# Patient Record
Sex: Male | Born: 1945
Health system: Southern US, Community
[De-identification: ages and names within clinical notes are randomized; demographics above are authoritative.]

## PROBLEM LIST (undated history)

## (undated) DIAGNOSIS — I10 Essential (primary) hypertension: Secondary | ICD-10-CM

## (undated) DIAGNOSIS — E785 Hyperlipidemia, unspecified: Secondary | ICD-10-CM

## (undated) DIAGNOSIS — N529 Male erectile dysfunction, unspecified: Secondary | ICD-10-CM

## (undated) DIAGNOSIS — B181 Chronic viral hepatitis B without delta-agent: Secondary | ICD-10-CM

## (undated) DIAGNOSIS — E109 Type 1 diabetes mellitus without complications: Secondary | ICD-10-CM

## (undated) HISTORY — DX: Hyperlipidemia, unspecified: E78.5

## (undated) HISTORY — DX: Type 1 diabetes mellitus without complications: E10.9

## (undated) HISTORY — DX: Male erectile dysfunction, unspecified: N52.9

## (undated) HISTORY — DX: Gilbert syndrome: E80.4

## (undated) HISTORY — PX: FEMORAL HERNIA REPAIR: SHX632

## (undated) HISTORY — DX: Chronic viral hepatitis B without delta-agent: B18.1

## (undated) HISTORY — DX: Essential (primary) hypertension: I10

---
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
## 2006-10-28 ENCOUNTER — Ambulatory Visit: Payer: Self-pay | Admitting: Family Medicine

## 2006-11-18 ENCOUNTER — Ambulatory Visit: Payer: Self-pay | Admitting: Family Medicine

## 2007-03-20 ENCOUNTER — Ambulatory Visit: Payer: Self-pay | Admitting: Family Medicine

## 2007-04-29 ENCOUNTER — Ambulatory Visit: Payer: Self-pay | Admitting: Gastroenterology

## 2007-05-27 LAB — HM COLONOSCOPY: HM Colonoscopy: NORMAL

## 2007-08-04 ENCOUNTER — Ambulatory Visit: Payer: Self-pay | Admitting: Family Medicine

## 2007-09-14 ENCOUNTER — Ambulatory Visit (HOSPITAL_COMMUNITY): Payer: Self-pay | Admitting: Psychiatry

## 2007-10-05 ENCOUNTER — Ambulatory Visit (HOSPITAL_COMMUNITY): Payer: Self-pay | Admitting: Psychiatry

## 2007-10-26 ENCOUNTER — Ambulatory Visit (HOSPITAL_COMMUNITY): Payer: Self-pay | Admitting: Marriage and Family Therapist

## 2007-10-28 ENCOUNTER — Ambulatory Visit (HOSPITAL_COMMUNITY): Payer: Self-pay | Admitting: Psychiatry

## 2007-11-10 ENCOUNTER — Ambulatory Visit: Payer: Self-pay | Admitting: Family Medicine

## 2007-12-03 ENCOUNTER — Ambulatory Visit: Payer: Self-pay | Admitting: Family Medicine

## 2007-12-09 ENCOUNTER — Ambulatory Visit (HOSPITAL_COMMUNITY): Payer: Self-pay | Admitting: Psychiatry

## 2008-01-12 DIAGNOSIS — E119 Type 2 diabetes mellitus without complications: Secondary | ICD-10-CM

## 2008-01-12 DIAGNOSIS — I10 Essential (primary) hypertension: Secondary | ICD-10-CM | POA: Insufficient documentation

## 2008-01-12 DIAGNOSIS — E785 Hyperlipidemia, unspecified: Secondary | ICD-10-CM

## 2008-02-10 ENCOUNTER — Ambulatory Visit (HOSPITAL_COMMUNITY): Payer: Self-pay | Admitting: Psychiatry

## 2008-04-04 ENCOUNTER — Ambulatory Visit: Payer: Self-pay | Admitting: Family Medicine

## 2008-04-13 ENCOUNTER — Ambulatory Visit (HOSPITAL_COMMUNITY): Payer: Self-pay | Admitting: Licensed Clinical Social Worker

## 2008-06-13 ENCOUNTER — Ambulatory Visit (HOSPITAL_COMMUNITY): Payer: Self-pay | Admitting: Psychiatry

## 2008-08-08 ENCOUNTER — Ambulatory Visit (HOSPITAL_COMMUNITY): Payer: Self-pay | Admitting: Psychiatry

## 2008-08-30 ENCOUNTER — Ambulatory Visit: Payer: Self-pay | Admitting: Family Medicine

## 2008-10-05 ENCOUNTER — Ambulatory Visit (HOSPITAL_COMMUNITY): Payer: Self-pay | Admitting: Psychiatry

## 2009-01-04 ENCOUNTER — Ambulatory Visit (HOSPITAL_COMMUNITY): Payer: Self-pay | Admitting: Psychiatry

## 2009-01-05 ENCOUNTER — Ambulatory Visit: Payer: Self-pay | Admitting: Family Medicine

## 2009-03-10 ENCOUNTER — Ambulatory Visit (HOSPITAL_COMMUNITY): Payer: Self-pay | Admitting: Psychiatry

## 2009-04-19 ENCOUNTER — Ambulatory Visit: Payer: Self-pay | Admitting: Family Medicine

## 2009-06-14 ENCOUNTER — Ambulatory Visit (HOSPITAL_COMMUNITY): Payer: Self-pay | Admitting: Psychiatry

## 2009-08-02 ENCOUNTER — Ambulatory Visit: Payer: Self-pay | Admitting: Family Medicine

## 2009-09-18 ENCOUNTER — Ambulatory Visit (HOSPITAL_COMMUNITY): Payer: Self-pay | Admitting: Psychiatry

## 2009-11-30 ENCOUNTER — Ambulatory Visit: Payer: Self-pay | Admitting: Family Medicine

## 2010-01-10 ENCOUNTER — Ambulatory Visit (HOSPITAL_COMMUNITY): Payer: Self-pay | Admitting: Psychiatry

## 2010-04-03 ENCOUNTER — Ambulatory Visit: Payer: Self-pay | Admitting: Family Medicine

## 2010-05-03 ENCOUNTER — Ambulatory Visit: Payer: Self-pay | Admitting: Family Medicine

## 2010-08-09 ENCOUNTER — Ambulatory Visit: Payer: Self-pay | Admitting: Family Medicine

## 2010-09-04 ENCOUNTER — Ambulatory Visit: Payer: Self-pay | Admitting: Family Medicine

## 2010-09-14 ENCOUNTER — Ambulatory Visit: Payer: Self-pay | Admitting: Family Medicine

## 2010-09-19 ENCOUNTER — Ambulatory Visit: Payer: Self-pay | Admitting: Family Medicine

## 2010-10-15 ENCOUNTER — Ambulatory Visit
Admission: RE | Admit: 2010-10-15 | Discharge: 2010-10-15 | Payer: Self-pay | Source: Home / Self Care | Attending: Family Medicine | Admitting: Family Medicine

## 2011-01-02 ENCOUNTER — Ambulatory Visit (INDEPENDENT_AMBULATORY_CARE_PROVIDER_SITE_OTHER): Payer: BC Managed Care – PPO | Admitting: Family Medicine

## 2011-01-02 DIAGNOSIS — E119 Type 2 diabetes mellitus without complications: Secondary | ICD-10-CM

## 2011-01-02 DIAGNOSIS — Z79899 Other long term (current) drug therapy: Secondary | ICD-10-CM

## 2011-01-02 DIAGNOSIS — F39 Unspecified mood [affective] disorder: Secondary | ICD-10-CM

## 2011-01-02 DIAGNOSIS — E785 Hyperlipidemia, unspecified: Secondary | ICD-10-CM

## 2011-01-02 DIAGNOSIS — I10 Essential (primary) hypertension: Secondary | ICD-10-CM

## 2011-02-12 NOTE — Assessment & Plan Note (Signed)
[facility]                         GASTROENTEROLOGY OFFICE NOTE   SHIMON, TROWBRIDGE                       MRN:          [id number]  DATE:04/29/2007                            DOB:          20-Jan-1946    REFERRING PHYSICIAN:  Sharlot Gowda, M.D.   REASON FOR REFERRAL:  Dr. Susann Givens asked me to evaluate Christopher Welch in  consultation regarding colorectal cancer screening; the patient is at  high risk for endoscopic complications due to his insulin-requiring  diabetes.   HISTORY OF PRESENT ILLNESS:  Christopher Welch is a very pleasant 65 year old  man who has been on insulin for 16 years for severe diabetes.  He has  never had troubles with his colon, no constipation, no diarrhea, no  overt GI bleeding.  He has not had colorectal cancer screening to date.   REVIEW OF SYSTEMS:  Notable for stable weight, otherwise essentially  normal and is available on our intake sheet.   PAST MEDICAL HISTORY:  1. Type 1 diabetes.  2. Hypertension.  3. Elevated cholesterol.  4. Hernia surgery 4 years ago.   CURRENT MEDICINES:  1. Lantus in the morning.  2. Humalog throughout the day.  3. Univasc.  4. Lipitor.  5. Niaspan.  6. Aspirin.   ALLERGIES:  ERYTHROMYCIN.   SOCIAL HISTORY:  Single, lives alone, works as a Hydrologist for  [employer], nonsmoker, nondrinker.   FAMILY HISTORY:  Alcoholism and diabetes run in his family.  Cancer and  colon polyps do not run in his family.   PHYSICAL EXAMINATION:  5 feet 9 inches, 149 pounds, blood pressure  138/74, pulse 68.  CONSTITUTIONAL:  Generally well-appearing.  NEUROLOGIC:  Alert and oriented x3.  EYES:  Extraocular movements intact.  MOUTH:  Oropharynx is moist, no lesions.  NECK:  Supple.  No lymphadenopathy.  CARDIOVASCULAR:  Heart regular rate and rhythm.  LUNGS:  Clear to auscultation bilaterally.  ABDOMEN:  Soft, nontender, nondistended, normal bowel sounds.  EXTREMITIES:  No lower extremity  edema.  SKIN:  No rashes or lesions are visible on extremities.   ASSESSMENT/PLAN:  A 65 year old man at routine risk for colorectal  cancer.   We will arrange for him to have a colonoscopy done at his soonest  convenience.  He will take no insulin the day of the procedure.  It  looks like we will be scheduling him for a case around lunchtime.  He  knows that it much safer for [place] to air with his sugars being a bit high  for that day rather than low.   I see no reason for any further blood tests or imaging studies prior to  that.     Rachael Fee, MD  Electronically Signed    DPJ/MedQ  DD: 04/29/2007  DT: 04/30/2007  Job #: [id number]   cc:   Sharlot Gowda, M.D.

## 2011-02-18 ENCOUNTER — Other Ambulatory Visit: Payer: Self-pay | Admitting: Family Medicine

## 2011-02-18 MED ORDER — ATORVASTATIN CALCIUM 20 MG PO TABS: 20.0000 mg | ORAL_TABLET | Freq: Every day | ORAL | Status: DC

## 2011-02-26 ENCOUNTER — Other Ambulatory Visit: Payer: Self-pay | Admitting: Family Medicine

## 2011-02-26 MED ORDER — CITALOPRAM HYDROBROMIDE 20 MG PO TABS: 20.0000 mg | ORAL_TABLET | Freq: Every day | ORAL | Status: DC

## 2011-03-06 ENCOUNTER — Other Ambulatory Visit: Payer: Self-pay | Admitting: Family Medicine

## 2011-05-06 ENCOUNTER — Encounter: Payer: Self-pay | Admitting: Family Medicine

## 2011-05-07 ENCOUNTER — Encounter: Payer: Self-pay | Admitting: Family Medicine

## 2011-05-07 ENCOUNTER — Ambulatory Visit (INDEPENDENT_AMBULATORY_CARE_PROVIDER_SITE_OTHER): Payer: BC Managed Care – PPO | Admitting: Family Medicine

## 2011-05-07 DIAGNOSIS — E1159 Type 2 diabetes mellitus with other circulatory complications: Secondary | ICD-10-CM

## 2011-05-07 DIAGNOSIS — E119 Type 2 diabetes mellitus without complications: Secondary | ICD-10-CM

## 2011-05-07 DIAGNOSIS — I10 Essential (primary) hypertension: Secondary | ICD-10-CM

## 2011-05-07 DIAGNOSIS — E1169 Type 2 diabetes mellitus with other specified complication: Secondary | ICD-10-CM

## 2011-05-07 DIAGNOSIS — E785 Hyperlipidemia, unspecified: Secondary | ICD-10-CM

## 2011-05-07 NOTE — Progress Notes (Signed)
  Subjective:    Patient ID: Christopher Welch, male    DOB: 1946-06-19, 65 y.o.   MRN: [id number]  HPI He is here for recheck. He is now retired. His stress levels have diminished practically nothing. He rarely takes Klonopin or sleep med. He is in the process of readjusting his lifestyle in regard to diet and exercise. Continues to check his feet regularly. He does not smoke. He drinks maybe one Son of wine per night. Checks his blood sugars regularly. He is interested in stopping his Celexa. Since he is now retired he feels much more calm and relaxed. He states he is very stop his Lamictal  Review of Systems     Objective:   Physical Exam Alert and in no distress otherwise not examined with appropriate affect. Hemoglobin A1c 7.4       Assessment & Plan:  Diabetes. Hypertension. Hyperlipidemia. Dysthymia. Discussed diet and exercise with him especially in regard to his Stop. He will stop the Celexa. He has already stopped his Lamictal Recheck here in 4 months.

## 2011-07-01 ENCOUNTER — Other Ambulatory Visit: Payer: Self-pay

## 2011-07-01 MED ORDER — GLUCOSE BLOOD VI STRP: ORAL_STRIP | Status: DC

## 2011-07-01 NOTE — Telephone Encounter (Signed)
Ordered test strips for accu check nano

## 2011-08-12 ENCOUNTER — Telehealth: Payer: Self-pay | Admitting: Family Medicine

## 2011-08-12 MED ORDER — LISINOPRIL 10 MG PO TABS: 10.0000 mg | ORAL_TABLET | Freq: Every day | ORAL | Status: DC

## 2011-08-12 NOTE — Telephone Encounter (Signed)
Lisinopril renew

## 2011-08-22 ENCOUNTER — Other Ambulatory Visit: Payer: Self-pay | Admitting: Family Medicine

## 2011-08-23 NOTE — Telephone Encounter (Signed)
Is this okay?

## 2011-09-06 ENCOUNTER — Encounter: Payer: Self-pay | Admitting: Family Medicine

## 2011-09-06 ENCOUNTER — Ambulatory Visit (INDEPENDENT_AMBULATORY_CARE_PROVIDER_SITE_OTHER): Payer: BC Managed Care – PPO | Admitting: Family Medicine

## 2011-09-06 DIAGNOSIS — E1139 Type 2 diabetes mellitus with other diabetic ophthalmic complication: Secondary | ICD-10-CM

## 2011-09-06 DIAGNOSIS — E1339 Other specified diabetes mellitus with other diabetic ophthalmic complication: Secondary | ICD-10-CM

## 2011-09-06 DIAGNOSIS — I10 Essential (primary) hypertension: Secondary | ICD-10-CM

## 2011-09-06 DIAGNOSIS — E7800 Pure hypercholesterolemia, unspecified: Secondary | ICD-10-CM

## 2011-09-06 DIAGNOSIS — E11319 Type 2 diabetes mellitus with unspecified diabetic retinopathy without macular edema: Secondary | ICD-10-CM

## 2011-09-06 DIAGNOSIS — E119 Type 2 diabetes mellitus without complications: Secondary | ICD-10-CM

## 2011-09-06 DIAGNOSIS — E109 Type 1 diabetes mellitus without complications: Secondary | ICD-10-CM

## 2011-09-06 DIAGNOSIS — E13319 Other specified diabetes mellitus with unspecified diabetic retinopathy without macular edema: Secondary | ICD-10-CM

## 2011-09-06 LAB — POCT GLYCOSYLATED HEMOGLOBIN (HGB A1C): Hemoglobin A1C: 7.4

## 2011-09-06 MED ORDER — INSULIN GLARGINE 100 UNIT/ML ~~LOC~~ SOLN: 40.0000 [IU] | Freq: Every day | SUBCUTANEOUS | Status: DC

## 2011-09-06 MED ORDER — ATORVASTATIN CALCIUM 20 MG PO TABS: 20.0000 mg | ORAL_TABLET | Freq: Every day | ORAL | Status: DC

## 2011-09-06 NOTE — Progress Notes (Signed)
  Subjective:    Patient ID: Christopher Welch, male    DOB: Jun 01, 1946, 65 y.o.   MRN: [id number]  HPI Her for a diabetes recheck. He is now living in [place] and retired from Agricultural consultant but keeping busy with other activities. He is also noted a slight weight gain and is now involved in an exercise program. He continues to check his blood sugars regularly. He does not smoke. He is now living with his partner. His medications were reviewed. He has an eye exam set up for January. He does check his feet periodically.   Review of Systems     Objective:   Physical Exam Alert and in no distress. Hemoglobin A1c is 7.4.       Assessment & Plan:   1. Diabetes mellitus  POCT HgB A1C  2. Retinopathy due to secondary diabetes    3. HYPERTENSION    4. HYPERCHOLESTEROLEMIA    5. DIABETES MELLITUS, TYPE I     I encouraged him to continue his making diet and exercise changes to get his A1c back below 7. Check here 4 months.

## 2011-09-10 ENCOUNTER — Other Ambulatory Visit: Payer: Self-pay

## 2011-09-10 NOTE — Telephone Encounter (Signed)
Pt use vial for his lantus

## 2011-11-18 ENCOUNTER — Telehealth: Payer: Self-pay | Admitting: Family Medicine

## 2011-11-18 MED ORDER — LISINOPRIL 10 MG PO TABS: 10.0000 mg | ORAL_TABLET | Freq: Every day | ORAL | Status: DC

## 2011-11-18 NOTE — Telephone Encounter (Signed)
Lisinopril renewed to local pharmacy

## 2011-11-22 ENCOUNTER — Telehealth: Payer: Self-pay | Admitting: Internal Medicine

## 2011-11-22 NOTE — Telephone Encounter (Signed)
Pt stated Dr Susann Givens is not in  His ins  Network. Pt is requesting dr Lovell Sheehan as new pcp.

## 2011-11-22 NOTE — Telephone Encounter (Signed)
Sorry.dr jenkins is not taking new pts. Several other mds in practice who is taking new pt

## 2011-11-26 NOTE — Telephone Encounter (Signed)
Pt will still with Dr Susann Givens

## 2011-12-03 ENCOUNTER — Encounter: Payer: BC Managed Care – PPO | Admitting: Family Medicine

## 2011-12-05 ENCOUNTER — Encounter: Payer: Self-pay | Admitting: Family Medicine

## 2011-12-05 ENCOUNTER — Ambulatory Visit (INDEPENDENT_AMBULATORY_CARE_PROVIDER_SITE_OTHER): Payer: 59 | Admitting: Family Medicine

## 2011-12-05 DIAGNOSIS — E109 Type 1 diabetes mellitus without complications: Secondary | ICD-10-CM

## 2011-12-05 DIAGNOSIS — E11329 Type 2 diabetes mellitus with mild nonproliferative diabetic retinopathy without macular edema: Secondary | ICD-10-CM

## 2011-12-05 DIAGNOSIS — I10 Essential (primary) hypertension: Secondary | ICD-10-CM

## 2011-12-05 DIAGNOSIS — Z0000 Encounter for general adult medical examination without abnormal findings: Secondary | ICD-10-CM

## 2011-12-05 DIAGNOSIS — E1139 Type 2 diabetes mellitus with other diabetic ophthalmic complication: Secondary | ICD-10-CM

## 2011-12-05 DIAGNOSIS — E113299 Type 2 diabetes mellitus with mild nonproliferative diabetic retinopathy without macular edema, unspecified eye: Secondary | ICD-10-CM | POA: Insufficient documentation

## 2011-12-05 DIAGNOSIS — H579 Unspecified disorder of eye and adnexa: Secondary | ICD-10-CM

## 2011-12-05 DIAGNOSIS — E7800 Pure hypercholesterolemia, unspecified: Secondary | ICD-10-CM

## 2011-12-05 DIAGNOSIS — Z79899 Other long term (current) drug therapy: Secondary | ICD-10-CM

## 2011-12-05 LAB — CBC WITH DIFFERENTIAL/PLATELET
Basophils Relative: 1 % (ref 0–1)
Eosinophils Relative: 5 % (ref 0–5)
HCT: 44.8 % (ref 39.0–52.0)
Hemoglobin: 15.3 g/dL (ref 13.0–17.0)
MCH: 32.6 pg (ref 26.0–34.0)
MCHC: 34.2 g/dL (ref 30.0–36.0)
MCV: 95.5 fL (ref 78.0–100.0)
Monocytes Absolute: 0.8 10*3/uL (ref 0.1–1.0)
Monocytes Relative: 11 % (ref 3–12)
Neutro Abs: 2.8 10*3/uL (ref 1.7–7.7)

## 2011-12-05 LAB — POCT UA - MICROALBUMIN: Albumin/Creatinine Ratio, Urine, POC: 5.5

## 2011-12-05 LAB — COMPREHENSIVE METABOLIC PANEL
ALT: 19 U/L (ref 0–53)
AST: 25 U/L (ref 0–37)
CO2: 26 mEq/L (ref 19–32)
Calcium: 10.1 mg/dL (ref 8.4–10.5)
Chloride: 101 mEq/L (ref 96–112)
Sodium: 138 mEq/L (ref 135–145)
Total Bilirubin: 2 mg/dL — ABNORMAL HIGH (ref 0.3–1.2)
Total Protein: 7.2 g/dL (ref 6.0–8.3)

## 2011-12-05 LAB — HEMOCCULT GUIAC POC 1CARD (OFFICE)

## 2011-12-05 LAB — LIPID PANEL
Cholesterol: 108 mg/dL (ref 0–200)
Total CHOL/HDL Ratio: 2.1 Ratio
VLDL: 11 mg/dL (ref 0–40)

## 2011-12-05 MED ORDER — INSULIN GLARGINE 100 UNIT/ML ~~LOC~~ SOLN: 40.0000 [IU] | Freq: Every day | SUBCUTANEOUS | Status: DC

## 2011-12-05 MED ORDER — LISINOPRIL 10 MG PO TABS: 10.0000 mg | ORAL_TABLET | Freq: Every day | ORAL | Status: DC

## 2011-12-05 MED ORDER — ATORVASTATIN CALCIUM 20 MG PO TABS: 20.0000 mg | ORAL_TABLET | Freq: Every day | ORAL | Status: DC

## 2011-12-05 MED ORDER — INSULIN LISPRO 100 UNIT/ML ~~LOC~~ SOLN: 25.0000 [IU] | Freq: Every day | SUBCUTANEOUS | Status: DC

## 2011-12-05 MED ORDER — NIACIN ER (ANTIHYPERLIPIDEMIC) 750 MG PO TBCR: 750.0000 mg | EXTENDED_RELEASE_TABLET | Freq: Every day | ORAL | Status: DC

## 2011-12-05 NOTE — Patient Instructions (Signed)
Keep up the good work

## 2011-12-05 NOTE — Progress Notes (Signed)
  Subjective:    Patient ID: Christopher Welch, male    DOB: Jun 04, 1946, 66 y.o.   MRN: [id number]  HPI He is here for complete examination. He is now retired and living in [place]. He is in a long-term relationship which is going quite well. He has done a very good job taking care of himself and his new life situation. He keeps itself is the with various activities. His blood sugars are checked regularly. He checks his feet regularly. Had an eye exam in January. He has no other concerns or complaints.   Review of Systems  Constitutional: Negative.   HENT: Negative.   Eyes: Negative.   Respiratory: Negative.   Cardiovascular: Negative.   Gastrointestinal: Negative.   Genitourinary: Negative.   Musculoskeletal: Negative.   Skin: Negative.   Neurological: Negative.   Hematological: Negative.        Objective:   Physical Exam BP 122/80  Pulse 80  Ht 5' 9.5" (1.765 m)  Wt 164 lb (74.39 kg)  BMI 23.87 kg/m2  General Appearance:    Alert, cooperative, no distress, appears stated age  Head:    Normocephalic, without obvious abnormality, atraumatic  Eyes:    PERRL, conjunctiva/corneas clear, EOM's intact, fundi    benign  Ears:    Normal TM's and external ear canals  Nose:   Nares normal, mucosa normal, no drainage or sinus   tenderness  Throat:   Lips, mucosa, and tongue normal; teeth and gums normal  Neck:   Supple, no lymphadenopathy;  thyroid:  no   enlargement/tenderness/nodules; no carotid   bruit or JVD  Back:    Spine nontender, no curvature, ROM normal, no CVA     tenderness  Lungs:     Clear to auscultation bilaterally without wheezes, rales or     ronchi; respirations unlabored  Chest Wall:    No tenderness or deformity   Heart:    Regular rate and rhythm, S1 and S2 normal, no murmur, rub   or gallop  Breast Exam:    No chest wall tenderness, masses or gynecomastia  Abdomen:     Soft, non-tender, nondistended, normoactive bowel sounds,    no masses, no  hepatosplenomegaly  Genitalia:    Normal male external genitalia without lesions.  Testicles without masses.  No inguinal hernias.  Rectal:    Normal sphincter tone, no masses or tenderness; guaiac negative stool.  Prostate smooth, no nodules, not enlarged.  Extremities:   No clubbing, cyanosis or edema  Pulses:   2+ and symmetric all extremities  Skin:   Skin color, texture, turgor normal, no rashes or lesions  Lymph nodes:   Cervical, supraclavicular, and axillary nodes normal  Neurologic:   CNII-XII intact, normal strength, sensation and gait; reflexes 2+ and symmetric throughout          Psych:   Normal mood, affect, hygiene and grooming.           Assessment & Plan:   1. Routine general medical examination at a health care facility  Hemoccult - 1 Card (office)  2. DIABETES MELLITUS, TYPE I  CBC with Differential, Comprehensive metabolic panel, Lipid panel, POCT UA - Microalbumin  3. HYPERTENSION  CBC with Differential, Comprehensive metabolic panel  4. HYPERCHOLESTEROLEMIA  Lipid panel  5. Diabetic retinopathy, nonproliferative, mild    6. Encounter for long-term (current) use of other medications  CBC with Differential, Comprehensive metabolic panel, Lipid panel

## 2011-12-06 ENCOUNTER — Telehealth: Payer: Self-pay | Admitting: Internal Medicine

## 2011-12-06 MED ORDER — INSULIN LISPRO 100 UNIT/ML ~~LOC~~ SOLN: 25.0000 [IU] | Freq: Every day | SUBCUTANEOUS | Status: DC

## 2011-12-06 MED ORDER — NIACIN ER (ANTIHYPERLIPIDEMIC) 750 MG PO TBCR: EXTENDED_RELEASE_TABLET | ORAL | Status: DC

## 2011-12-06 MED ORDER — INSULIN GLARGINE 100 UNIT/ML ~~LOC~~ SOLN: 40.0000 [IU] | Freq: Every day | SUBCUTANEOUS | Status: DC

## 2011-12-06 MED ORDER — ATORVASTATIN CALCIUM 20 MG PO TABS: 20.0000 mg | ORAL_TABLET | Freq: Every day | ORAL | Status: DC

## 2011-12-06 MED ORDER — LISINOPRIL 10 MG PO TABS: 10.0000 mg | ORAL_TABLET | Freq: Every day | ORAL | Status: DC

## 2011-12-06 NOTE — Telephone Encounter (Signed)
Pt requested a 90 day supply for 5 meds which had been send to [facility] and they told him for a 90 day supply to go to [facility]. So since Dr. Susann Givens had filled them for him on 3*7, I resent them to [facility] with a 90 day supply per pt request

## 2011-12-06 NOTE — Progress Notes (Signed)
Quick Note:  The blood work is normal ______ 

## 2012-01-07 ENCOUNTER — Ambulatory Visit: Payer: BC Managed Care – PPO | Admitting: Family Medicine

## 2012-04-09 ENCOUNTER — Ambulatory Visit: Payer: 59 | Admitting: Family Medicine

## 2012-04-13 ENCOUNTER — Encounter: Payer: Self-pay | Admitting: Family Medicine

## 2012-04-13 ENCOUNTER — Ambulatory Visit (INDEPENDENT_AMBULATORY_CARE_PROVIDER_SITE_OTHER): Payer: 59 | Admitting: Family Medicine

## 2012-04-13 VITALS — BP 126/80 | HR 90 | Wt 161.0 lb

## 2012-04-13 DIAGNOSIS — E7800 Pure hypercholesterolemia, unspecified: Secondary | ICD-10-CM

## 2012-04-13 DIAGNOSIS — E109 Type 1 diabetes mellitus without complications: Secondary | ICD-10-CM

## 2012-04-13 DIAGNOSIS — I10 Essential (primary) hypertension: Secondary | ICD-10-CM

## 2012-04-13 DIAGNOSIS — E11329 Type 2 diabetes mellitus with mild nonproliferative diabetic retinopathy without macular edema: Secondary | ICD-10-CM

## 2012-04-13 DIAGNOSIS — H579 Unspecified disorder of eye and adnexa: Secondary | ICD-10-CM

## 2012-04-13 DIAGNOSIS — E113299 Type 2 diabetes mellitus with mild nonproliferative diabetic retinopathy without macular edema, unspecified eye: Secondary | ICD-10-CM

## 2012-04-13 DIAGNOSIS — K130 Diseases of lips: Secondary | ICD-10-CM

## 2012-04-13 DIAGNOSIS — E1139 Type 2 diabetes mellitus with other diabetic ophthalmic complication: Secondary | ICD-10-CM

## 2012-04-13 LAB — POCT GLYCOSYLATED HEMOGLOBIN (HGB A1C): Hemoglobin A1C: 7.1

## 2012-04-13 NOTE — Progress Notes (Signed)
  Subjective:    Patient ID: Christopher Welch, male    DOB: June 14, 1946, 66 y.o.   MRN: [id number]  HPI He is here for a recheck. He continues to take good care of himself in regard to his diabetes. He does check his blood sugars regularly as well as have yearly eye exams and does check his feet. He continues on medications listed in the chart. He does not smoke and rarely drinks. He is sexually active and is having no difficulties. He does have a lesion present on his lipid he would likely to evaluate.   Review of Systems     Objective:   Physical Exam Alert and in no distress. Purplish 0.5 cm lesion noted on the mid lower lip. Hemoglobin A1c is 6.8.       Assessment & Plan:   1. DIABETES MELLITUS, TYPE I  POCT glycosylated hemoglobin (Hb A1C)  2. HYPERCHOLESTEROLEMIA    3. HYPERTENSION    4. Diabetic retinopathy, nonproliferative, mild    5. Lip lesion  Ambulatory referral to Dermatology   He is enjoying his retirement. Encouraged him to continue with his active lifestyle. Recheck here 4 months.

## 2012-06-08 ENCOUNTER — Other Ambulatory Visit: Payer: Self-pay

## 2012-06-08 MED ORDER — NIACIN ER (ANTIHYPERLIPIDEMIC) 750 MG PO TBCR: EXTENDED_RELEASE_TABLET | ORAL | Status: DC

## 2012-06-08 MED ORDER — INSULIN LISPRO 100 UNIT/ML ~~LOC~~ SOLN: 25.0000 [IU] | Freq: Every day | SUBCUTANEOUS | Status: DC

## 2012-06-08 NOTE — Telephone Encounter (Signed)
SENT IN HUMALOG AND NIASPAN

## 2012-06-16 ENCOUNTER — Encounter: Payer: Self-pay | Admitting: Family Medicine

## 2012-08-17 ENCOUNTER — Ambulatory Visit: Payer: 59 | Admitting: Family Medicine

## 2012-08-18 ENCOUNTER — Encounter: Payer: Self-pay | Admitting: Family Medicine

## 2012-08-18 ENCOUNTER — Ambulatory Visit (INDEPENDENT_AMBULATORY_CARE_PROVIDER_SITE_OTHER): Payer: 59 | Admitting: Family Medicine

## 2012-08-18 VITALS — BP 126/80 | HR 91 | Wt 164.0 lb

## 2012-08-18 DIAGNOSIS — E109 Type 1 diabetes mellitus without complications: Secondary | ICD-10-CM

## 2012-08-19 NOTE — Progress Notes (Signed)
  Subjective:    Patient ID: Christopher Welch, male    DOB: March 19, 1946, 66 y.o.   MRN: [id number]  HPI    Review of Systems     Objective:   Physical Exam        Assessment & Plan:  Patient was not seen. He was rescheduled

## 2012-08-20 ENCOUNTER — Encounter: Payer: Self-pay | Admitting: Family Medicine

## 2012-08-20 ENCOUNTER — Ambulatory Visit (INDEPENDENT_AMBULATORY_CARE_PROVIDER_SITE_OTHER): Payer: 59 | Admitting: Family Medicine

## 2012-08-20 VITALS — BP 126/80 | HR 91 | Wt 164.0 lb

## 2012-08-20 DIAGNOSIS — I10 Essential (primary) hypertension: Secondary | ICD-10-CM

## 2012-08-20 DIAGNOSIS — E109 Type 1 diabetes mellitus without complications: Secondary | ICD-10-CM

## 2012-08-20 DIAGNOSIS — E113299 Type 2 diabetes mellitus with mild nonproliferative diabetic retinopathy without macular edema, unspecified eye: Secondary | ICD-10-CM

## 2012-08-20 DIAGNOSIS — Z2911 Encounter for prophylactic immunotherapy for respiratory syncytial virus (RSV): Secondary | ICD-10-CM

## 2012-08-20 DIAGNOSIS — E11329 Type 2 diabetes mellitus with mild nonproliferative diabetic retinopathy without macular edema: Secondary | ICD-10-CM

## 2012-08-20 DIAGNOSIS — E7800 Pure hypercholesterolemia, unspecified: Secondary | ICD-10-CM

## 2012-08-20 DIAGNOSIS — E1139 Type 2 diabetes mellitus with other diabetic ophthalmic complication: Secondary | ICD-10-CM

## 2012-08-20 NOTE — Progress Notes (Signed)
  Subjective:    Patient ID: Christopher Welch, male    DOB: [birth year], 66 y.o.   MRN: [id number]  HPI He is here for recheck. He is now retired but keeps himself quite busy. He states that this interferes with his regular exercise routine. His eating habits remain the same. Smoking and drinking and not changed. He does check his feet regularly as well as get regular eye exams, his last one being in August.. He continues on medications listed in the chart. He also has a rash present on his left shin that tends to come and go.   Review of Systems     Objective:   Physical Exam Alert and in no distress. Hemoglobin A1c is 7.5 Exam of left hand does show a dry slightly erythematous 3 x 5 cm area.      Assessment & Plan:   1. DIABETES MELLITUS, TYPE I   2. HYPERCHOLESTEROLEMIA   3. HYPERTENSION   4. Diabetic retinopathy, nonproliferative, mild    recommend conservative care for the nonspecific rash. Encouraged him to increase his physical activities.

## 2012-10-21 ENCOUNTER — Telehealth: Payer: Self-pay | Admitting: Internal Medicine

## 2012-10-21 NOTE — Telephone Encounter (Signed)
Refill request for accu-chek nano kit to [facility]

## 2012-10-23 ENCOUNTER — Other Ambulatory Visit: Payer: Self-pay

## 2012-10-23 MED ORDER — ACCU-CHEK NANO SMARTVIEW W/DEVICE KIT: 1.0000 | PACK | Freq: Two times a day (BID) | Status: AC

## 2012-10-23 NOTE — Telephone Encounter (Signed)
Sent in for pt new meter

## 2012-10-26 ENCOUNTER — Telehealth: Payer: Self-pay | Admitting: Internal Medicine

## 2012-10-26 MED ORDER — GLUCOSE BLOOD VI STRP: ORAL_STRIP | Status: AC

## 2012-10-26 NOTE — Telephone Encounter (Signed)
Pt came by today and needed test strips since in for is testing meter. i had called the patient last week to find out how many time a day he test but pt came by instead. I have sent in test strips to pharmacy

## 2012-11-02 ENCOUNTER — Telehealth: Payer: Self-pay | Admitting: Family Medicine

## 2012-11-02 MED ORDER — INSULIN GLARGINE 100 UNIT/ML ~~LOC~~ SOLN: 45.0000 [IU] | Freq: Every day | SUBCUTANEOUS | Status: DC

## 2012-11-02 MED ORDER — INSULIN LISPRO 100 UNIT/ML ~~LOC~~ SOLN: 15.0000 [IU] | Freq: Three times a day (TID) | SUBCUTANEOUS | Status: DC

## 2012-11-02 NOTE — Telephone Encounter (Signed)
Refilled med for pt.

## 2012-12-18 ENCOUNTER — Ambulatory Visit: Payer: 59 | Admitting: Family Medicine

## 2012-12-28 ENCOUNTER — Telehealth: Payer: Self-pay | Admitting: Family Medicine

## 2012-12-28 MED ORDER — NIACIN ER (ANTIHYPERLIPIDEMIC) 750 MG PO TBCR: EXTENDED_RELEASE_TABLET | ORAL | Status: DC

## 2012-12-28 NOTE — Telephone Encounter (Signed)
PT DROPPED OFF RX FORM. PLEASE REFILL NIACIN ER 750 MG. SEND TO [facility]. SENDING FORM BACK, WILL BE IN YOUR FOLDER.

## 2012-12-29 ENCOUNTER — Encounter: Payer: Self-pay | Admitting: Internal Medicine

## 2012-12-31 ENCOUNTER — Encounter: Payer: Self-pay | Admitting: Family Medicine

## 2012-12-31 ENCOUNTER — Ambulatory Visit (INDEPENDENT_AMBULATORY_CARE_PROVIDER_SITE_OTHER): Payer: Medicare Other | Admitting: Family Medicine

## 2012-12-31 VITALS — BP 118/80 | HR 80 | Wt 162.0 lb

## 2012-12-31 DIAGNOSIS — E1139 Type 2 diabetes mellitus with other diabetic ophthalmic complication: Secondary | ICD-10-CM

## 2012-12-31 DIAGNOSIS — I10 Essential (primary) hypertension: Secondary | ICD-10-CM

## 2012-12-31 DIAGNOSIS — E113299 Type 2 diabetes mellitus with mild nonproliferative diabetic retinopathy without macular edema, unspecified eye: Secondary | ICD-10-CM

## 2012-12-31 DIAGNOSIS — E109 Type 1 diabetes mellitus without complications: Secondary | ICD-10-CM

## 2012-12-31 DIAGNOSIS — E11329 Type 2 diabetes mellitus with mild nonproliferative diabetic retinopathy without macular edema: Secondary | ICD-10-CM

## 2012-12-31 DIAGNOSIS — E7800 Pure hypercholesterolemia, unspecified: Secondary | ICD-10-CM

## 2012-12-31 LAB — POCT UA - MICROALBUMIN
Albumin/Creatinine Ratio, Urine, POC: 7.2
Creatinine, POC: 100.1 mg/dL
Microalbumin Ur, POC: 7.2 mg/dL

## 2012-12-31 MED ORDER — LISINOPRIL 10 MG PO TABS: 10.0000 mg | ORAL_TABLET | Freq: Every day | ORAL | Status: DC

## 2012-12-31 MED ORDER — ATORVASTATIN CALCIUM 20 MG PO TABS: 20.0000 mg | ORAL_TABLET | Freq: Every day | ORAL | Status: DC

## 2012-12-31 NOTE — Progress Notes (Signed)
LAST EYE EXAM: 04/30/12 [facility] LAST FOOT EXAM : 08/20/12 [facility] B/S READING: 35 TO 350  TEST 3 TIMES A DAY

## 2012-12-31 NOTE — Progress Notes (Signed)
  Subjective:    Christopher Welch is a 67 y.o. male who presents for follow-up of Type 2 diabetes mellitus.    Home blood sugar records: fasting range: 35   To 350 however this occurred only one time.  Current symptoms/problems include none and have   been unchanged. Daily foot checks, foot concerns:  Last eye exam:     Medication compliance: Current diet: in general, a "healthy" diet   Current exercise: walking Known diabetic complications: retinopathy Cardiovascular risk factors: advanced age (older than 16 for men, 66 for women), diabetes mellitus, dyslipidemia, hypertension and male gender  Social history was reviewed.    ROS as in subjective above    Objective:    BP 118/80  Pulse 80  Wt 162 lb (73.483 kg)  BMI 23.59 kg/m2  Filed Vitals:   12/31/12 0922  BP: 118/80  Pulse: 80    General appearence: alert, no distress, WD/WN Neck: supple, no lymphadenopathy, no thyromegaly, no masses Heart: RRR, normal S1, S2, no murmurs Lungs: CTA bilaterally, no wheezes, rhonchi, or rales Abdomen: +bs, soft, non tender, non distended, no masses, no hepatomegaly, no splenomegaly Pulses: 2+ symmetric, upper and lower extremities, normal cap refill Ext: no edema Foot exam:  Neuro: foot monofilament exam normal   Lab Review Lab Results  Component Value Date   HGBA1C 6.9 12/31/2012   Lab Results  Component Value Date   CHOL 108 12/05/2011   HDL 51 12/05/2011   LDLCALC 46 12/05/2011   TRIG 53 12/05/2011   CHOLHDL 2.1 12/05/2011   No results found for this basenameConcepcion Elk     Chemistry      Component Value Date/Time   NA 138 12/05/2011 1117   K 3.9 12/05/2011 1117   CL 101 12/05/2011 1117   CO2 26 12/05/2011 1117   BUN 15 12/05/2011 1117   CREATININE 1.16 12/05/2011 1117      Component Value Date/Time   CALCIUM 10.1 12/05/2011 1117   ALKPHOS 42 12/05/2011 1117   AST 25 12/05/2011 1117   ALT 19 12/05/2011 1117   BILITOT 2.0* 12/05/2011 1117        Chemistry      Component  Value Date/Time   NA 138 12/05/2011 1117   K 3.9 12/05/2011 1117   CL 101 12/05/2011 1117   CO2 26 12/05/2011 1117   BUN 15 12/05/2011 1117   CREATININE 1.16 12/05/2011 1117      Component Value Date/Time   CALCIUM 10.1 12/05/2011 1117   ALKPHOS 42 12/05/2011 1117   AST 25 12/05/2011 1117   ALT 19 12/05/2011 1117   BILITOT 2.0* 12/05/2011 1117       Last optometry/ophthalmology exam reviewed from: Hemoglobin A1c 6.9   Assessment:   Encounter Diagnoses  Name Primary?  . Diabetic retinopathy, nonproliferative, mild Yes  . DIABETES MELLITUS, TYPE I   . HYPERTENSION   . HYPERCHOLESTEROLEMIA          Plan:    1.  Rx changes: none lisinopril and Lipitor were renewed. 2.  Education: Reviewed 'ABCs' of diabetes management (respective goals in parentheses):  A1C (<7), blood pressure (<130/80), and cholesterol (LDL <100). 3.  Compliance at present is estimated to be excellent. Efforts to improve compliance (if necessary) will be directed at Keep up the good work. 4. Follow up: 4 months

## 2013-01-05 ENCOUNTER — Other Ambulatory Visit: Payer: Self-pay | Admitting: Family Medicine

## 2013-02-02 ENCOUNTER — Telehealth: Payer: Self-pay | Admitting: Family Medicine

## 2013-02-02 ENCOUNTER — Ambulatory Visit (INDEPENDENT_AMBULATORY_CARE_PROVIDER_SITE_OTHER): Payer: Medicare Other | Admitting: Family Medicine

## 2013-02-02 DIAGNOSIS — E109 Type 1 diabetes mellitus without complications: Secondary | ICD-10-CM

## 2013-02-02 MED ORDER — INSULIN GLARGINE 100 UNIT/ML ~~LOC~~ SOLN: 45.0000 [IU] | Freq: Every day | SUBCUTANEOUS | Status: DC

## 2013-02-02 NOTE — Patient Instructions (Signed)
Let me know whether Lantus or Levemir is better

## 2013-02-02 NOTE — Progress Notes (Signed)
  Subjective:    Patient ID: Christopher Welch, male    DOB: 10/17/1945, 67 y.o.   MRN: [id number]  HPI He is here to discuss Lantus. Apparently his carrier his race the proximal 100%. He did note that he can get it cheaper through her local pharmacy.   Review of Systems     Objective:   Physical Exam Alert and in no distress otherwise not examined       Assessment & Plan:  DIABETES MELLITUS, TYPE I recommend he check with his carrier to see if Lantus versus Levemir would be the best choice and he is to let me know.

## 2013-02-02 NOTE — Telephone Encounter (Signed)
Lantus called in

## 2013-03-18 ENCOUNTER — Ambulatory Visit (INDEPENDENT_AMBULATORY_CARE_PROVIDER_SITE_OTHER): Payer: Medicare Other | Admitting: Family Medicine

## 2013-03-18 ENCOUNTER — Encounter: Payer: Self-pay | Admitting: Family Medicine

## 2013-03-18 DIAGNOSIS — E785 Hyperlipidemia, unspecified: Secondary | ICD-10-CM

## 2013-03-18 MED ORDER — NIACIN ER (ANTIHYPERLIPIDEMIC) 750 MG PO TBCR: EXTENDED_RELEASE_TABLET | ORAL | Status: DC

## 2013-03-18 NOTE — Progress Notes (Signed)
  Subjective:    Patient ID: Christopher Welch, male    DOB: 1945/12/21, 67 y.o.   MRN: [id number]  HPI He is here to discuss his medications. She switched to a different insurance plan and now Niaspan has been switched to a generic extended release formulation. Apparently this is also quite expensive and he has concerns over this.   Review of Systems     Objective:   Physical Exam Alert and in no distress otherwise not examined       Assessment & Plan:  Hyperlipidemia LDL goal < 70 - Plan: niacin (NIASPAN) 750 MG CR tablet I discussed the fact that every time he switches to a different insurance plan, he can have difficulty with medications. Apparently they've did increase the cost of this medication after he  Signed up for the plan. Discussed options with him and he will stay on the present medication dosing. When he returns for his next visit, I will check lipids and do an an NMR.

## 2013-04-05 ENCOUNTER — Ambulatory Visit: Payer: Medicare Other | Admitting: Family Medicine

## 2013-04-08 ENCOUNTER — Telehealth: Payer: Self-pay

## 2013-04-08 ENCOUNTER — Telehealth: Payer: Self-pay | Admitting: Internal Medicine

## 2013-04-08 DIAGNOSIS — Z0289 Encounter for other administrative examinations: Secondary | ICD-10-CM

## 2013-04-08 NOTE — Telephone Encounter (Signed)
CALLED AND COULD GET NO ANSWER FROM EITHER NUMBER JCL WANTED ME TO ASK IF THERE WAS A PROBLEM

## 2013-04-08 NOTE — Telephone Encounter (Signed)
Faxed over medical records to [facility] @ [place] @ [phone]

## 2013-05-04 ENCOUNTER — Ambulatory Visit: Payer: Medicare Other | Admitting: Family Medicine

## 2013-05-05 ENCOUNTER — Other Ambulatory Visit: Payer: Self-pay

## 2013-08-05 ENCOUNTER — Other Ambulatory Visit: Payer: Self-pay

## 2013-12-14 DIAGNOSIS — I10 Essential (primary) hypertension: Secondary | ICD-10-CM | POA: Diagnosis not present

## 2013-12-14 DIAGNOSIS — E785 Hyperlipidemia, unspecified: Secondary | ICD-10-CM | POA: Diagnosis not present

## 2013-12-14 DIAGNOSIS — E1065 Type 1 diabetes mellitus with hyperglycemia: Secondary | ICD-10-CM | POA: Diagnosis not present

## 2013-12-14 DIAGNOSIS — IMO0002 Reserved for concepts with insufficient information to code with codable children: Secondary | ICD-10-CM | POA: Diagnosis not present

## 2014-01-10 ENCOUNTER — Telehealth: Payer: Self-pay | Admitting: Family Medicine

## 2014-01-10 NOTE — Telephone Encounter (Signed)
[employer] [facility] review records request  Fax [phone]

## 2014-03-17 DIAGNOSIS — Z23 Encounter for immunization: Secondary | ICD-10-CM | POA: Diagnosis not present

## 2014-03-17 DIAGNOSIS — I10 Essential (primary) hypertension: Secondary | ICD-10-CM | POA: Diagnosis not present

## 2014-03-17 DIAGNOSIS — E109 Type 1 diabetes mellitus without complications: Secondary | ICD-10-CM | POA: Diagnosis not present

## 2014-03-17 DIAGNOSIS — E785 Hyperlipidemia, unspecified: Secondary | ICD-10-CM | POA: Diagnosis not present

## 2014-05-30 DIAGNOSIS — H538 Other visual disturbances: Secondary | ICD-10-CM | POA: Diagnosis not present

## 2014-05-30 DIAGNOSIS — E119 Type 2 diabetes mellitus without complications: Secondary | ICD-10-CM | POA: Diagnosis not present

## 2014-05-30 DIAGNOSIS — H2510 Age-related nuclear cataract, unspecified eye: Secondary | ICD-10-CM | POA: Diagnosis not present

## 2014-06-17 DIAGNOSIS — I10 Essential (primary) hypertension: Secondary | ICD-10-CM | POA: Diagnosis not present

## 2014-06-17 DIAGNOSIS — E785 Hyperlipidemia, unspecified: Secondary | ICD-10-CM | POA: Diagnosis not present

## 2014-06-17 DIAGNOSIS — E109 Type 1 diabetes mellitus without complications: Secondary | ICD-10-CM | POA: Diagnosis not present

## 2014-10-04 DIAGNOSIS — I10 Essential (primary) hypertension: Secondary | ICD-10-CM | POA: Diagnosis not present

## 2014-10-04 DIAGNOSIS — Z0000 Encounter for general adult medical examination without abnormal findings: Secondary | ICD-10-CM | POA: Diagnosis not present

## 2014-10-04 DIAGNOSIS — E119 Type 2 diabetes mellitus without complications: Secondary | ICD-10-CM | POA: Diagnosis not present

## 2014-10-04 DIAGNOSIS — Z125 Encounter for screening for malignant neoplasm of prostate: Secondary | ICD-10-CM | POA: Diagnosis not present

## 2014-10-04 DIAGNOSIS — Z1211 Encounter for screening for malignant neoplasm of colon: Secondary | ICD-10-CM | POA: Diagnosis not present

## 2014-10-04 DIAGNOSIS — E785 Hyperlipidemia, unspecified: Secondary | ICD-10-CM | POA: Diagnosis not present

## 2014-10-12 DIAGNOSIS — Z1211 Encounter for screening for malignant neoplasm of colon: Secondary | ICD-10-CM | POA: Diagnosis not present

## 2015-01-05 DIAGNOSIS — G4700 Insomnia, unspecified: Secondary | ICD-10-CM | POA: Diagnosis not present

## 2015-01-05 DIAGNOSIS — E119 Type 2 diabetes mellitus without complications: Secondary | ICD-10-CM | POA: Diagnosis not present

## 2015-01-05 DIAGNOSIS — E785 Hyperlipidemia, unspecified: Secondary | ICD-10-CM | POA: Diagnosis not present

## 2015-01-05 DIAGNOSIS — I10 Essential (primary) hypertension: Secondary | ICD-10-CM | POA: Diagnosis not present

## 2015-03-28 DIAGNOSIS — E119 Type 2 diabetes mellitus without complications: Secondary | ICD-10-CM | POA: Diagnosis not present

## 2015-03-28 DIAGNOSIS — E162 Hypoglycemia, unspecified: Secondary | ICD-10-CM | POA: Diagnosis not present

## 2015-03-28 DIAGNOSIS — Z794 Long term (current) use of insulin: Secondary | ICD-10-CM | POA: Diagnosis not present

## 2015-03-31 ENCOUNTER — Emergency Department (HOSPITAL_COMMUNITY): Payer: Medicare Other

## 2015-03-31 ENCOUNTER — Encounter (HOSPITAL_COMMUNITY): Payer: Self-pay | Admitting: *Deleted

## 2015-03-31 ENCOUNTER — Emergency Department (HOSPITAL_COMMUNITY)
Admission: EM | Admit: 2015-03-31 | Discharge: 2015-03-31 | Disposition: A | Discharge status: Home / Self Care | Payer: Medicare Other | Attending: Emergency Medicine | Admitting: Emergency Medicine

## 2015-03-31 DIAGNOSIS — I10 Essential (primary) hypertension: Secondary | ICD-10-CM | POA: Insufficient documentation

## 2015-03-31 DIAGNOSIS — Z87448 Personal history of other diseases of urinary system: Secondary | ICD-10-CM | POA: Insufficient documentation

## 2015-03-31 DIAGNOSIS — Z7982 Long term (current) use of aspirin: Secondary | ICD-10-CM | POA: Diagnosis not present

## 2015-03-31 DIAGNOSIS — Y9389 Activity, other specified: Secondary | ICD-10-CM | POA: Insufficient documentation

## 2015-03-31 DIAGNOSIS — Y998 Other external cause status: Secondary | ICD-10-CM | POA: Insufficient documentation

## 2015-03-31 DIAGNOSIS — Z794 Long term (current) use of insulin: Secondary | ICD-10-CM | POA: Diagnosis not present

## 2015-03-31 DIAGNOSIS — Z79899 Other long term (current) drug therapy: Secondary | ICD-10-CM | POA: Insufficient documentation

## 2015-03-31 DIAGNOSIS — S20219A Contusion of unspecified front wall of thorax, initial encounter: Secondary | ICD-10-CM | POA: Insufficient documentation

## 2015-03-31 DIAGNOSIS — E162 Hypoglycemia, unspecified: Secondary | ICD-10-CM | POA: Diagnosis not present

## 2015-03-31 DIAGNOSIS — Y92410 Unspecified street and highway as the place of occurrence of the external cause: Secondary | ICD-10-CM | POA: Diagnosis not present

## 2015-03-31 DIAGNOSIS — S299XXA Unspecified injury of thorax, initial encounter: Secondary | ICD-10-CM | POA: Diagnosis not present

## 2015-03-31 DIAGNOSIS — R079 Chest pain, unspecified: Secondary | ICD-10-CM | POA: Diagnosis not present

## 2015-03-31 DIAGNOSIS — Z2251 Carrier of viral hepatitis B: Secondary | ICD-10-CM | POA: Diagnosis not present

## 2015-03-31 DIAGNOSIS — E11649 Type 2 diabetes mellitus with hypoglycemia without coma: Secondary | ICD-10-CM | POA: Diagnosis present

## 2015-03-31 DIAGNOSIS — E785 Hyperlipidemia, unspecified: Secondary | ICD-10-CM | POA: Diagnosis not present

## 2015-03-31 LAB — COMPREHENSIVE METABOLIC PANEL
ALK PHOS: 37 U/L — AB (ref 38–126)
ALT: 19 U/L (ref 17–63)
AST: 25 U/L (ref 15–41)
Albumin: 4.3 g/dL (ref 3.5–5.0)
Anion gap: 9 (ref 5–15)
BUN: 14 mg/dL (ref 6–20)
CALCIUM: 9.3 mg/dL (ref 8.9–10.3)
CO2: 26 mmol/L (ref 22–32)
Chloride: 104 mmol/L (ref 101–111)
Creatinine, Ser: 0.99 mg/dL (ref 0.61–1.24)
GFR calc Af Amer: >60 mL/min (ref >60)
Glucose, Bld: 121 mg/dL — ABNORMAL HIGH (ref 65–99)
Potassium: 3.9 mmol/L (ref 3.5–5.1)
SODIUM: 139 mmol/L (ref 135–145)
Total Bilirubin: 0.9 mg/dL (ref 0.3–1.2)
Total Protein: 7 g/dL (ref 6.5–8.1)

## 2015-03-31 LAB — CBC
HEMATOCRIT: 40.7 % (ref 39.0–52.0)
Hemoglobin: 13.9 g/dL (ref 13.0–17.0)
MCH: 32.6 pg (ref 26.0–34.0)
MCHC: 34.2 g/dL (ref 30.0–36.0)
MCV: 95.3 fL (ref 78.0–100.0)
PLATELETS: 209 10*3/uL (ref 150–400)
RBC: 4.27 MIL/uL (ref 4.22–5.81)
RDW: 12.7 % (ref 11.5–15.5)
WBC: 10 10*3/uL (ref 4.0–10.5)

## 2015-03-31 LAB — CBG MONITORING, ED
Glucose-Capillary: 94 mg/dL (ref 65–99)
Glucose-Capillary: 216 mg/dL — ABNORMAL HIGH (ref 65–99)

## 2015-03-31 MED ORDER — HYDROCODONE-ACETAMINOPHEN 5-325 MG PO TABS: 1.0000 | ORAL_TABLET | ORAL | Status: DC | PRN
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
## 2015-03-31 NOTE — ED Notes (Signed)
RN DRAWING LABS 

## 2015-03-31 NOTE — Discharge Instructions (Signed)
Cough or deep breathe 10 times per hour. Hold pillow or towel against chest as needed. [place] for pain as needed.   Chest Contusion A chest contusion is a deep bruise on your chest area. Contusions are the result of an injury that caused bleeding under the skin. A chest contusion may involve bruising of the skin, muscles, or ribs. The contusion may turn blue, purple, or yellow. Minor injuries will give you a painless contusion, but more severe contusions may stay painful and swollen for a few weeks. CAUSES  A contusion is usually caused by a blow, trauma, or direct force to an area of the body. SYMPTOMS   Swelling and redness of the injured area.  Discoloration of the injured area.  Tenderness and soreness of the injured area.  Pain. DIAGNOSIS  The diagnosis can be made by taking a history and performing a physical exam. An X-ray, CT scan, or MRI may be needed to determine if there were any associated injuries, such as broken bones (fractures) or internal injuries. TREATMENT  Often, the best treatment for a chest contusion is resting, icing, and applying cold compresses to the injured area. Deep breathing exercises may be recommended to reduce the risk of pneumonia. Over-the-counter medicines may also be recommended for pain control. HOME CARE INSTRUCTIONS   Put ice on the injured area.  Put ice in a plastic bag.  Place a towel between your skin and the bag.  Leave the ice on for 15-20 minutes, 03-04 times a day.  Only take over-the-counter or prescription medicines as directed by your caregiver. Your caregiver may recommend avoiding anti-inflammatory medicines (aspirin, ibuprofen, and naproxen) for 48 hours because these medicines may increase bruising.  Rest the injured area.  Perform deep-breathing exercises as directed by your caregiver.  Stop smoking if you smoke.  Do not lift objects over 5 pounds (2.3 kg) for 3 days or longer if recommended by your caregiver. SEEK  IMMEDIATE MEDICAL CARE IF:   You have increased bruising or swelling.  You have pain that is getting worse.  You have difficulty breathing.  You have dizziness, weakness, or fainting.  You have blood in your urine or stool.  You cough up or vomit blood.  Your swelling or pain is not relieved with medicines. MAKE SURE YOU:   Understand these instructions.  Will watch your condition.  Will get help right away if you are not doing well or get worse. Document Released: 06/11/2001 Document Revised: 06/10/2012 Document Reviewed: 03/09/2012 [facility] Patient Information 2015 [place], [place]. This information is not intended to replace advice given to you by your health care provider. Make sure you discuss any questions you have with your health care provider.

## 2015-04-05 DIAGNOSIS — E162 Hypoglycemia, unspecified: Secondary | ICD-10-CM | POA: Diagnosis not present

## 2015-04-05 DIAGNOSIS — Z794 Long term (current) use of insulin: Secondary | ICD-10-CM | POA: Diagnosis not present

## 2015-04-05 DIAGNOSIS — E119 Type 2 diabetes mellitus without complications: Secondary | ICD-10-CM | POA: Diagnosis not present

## 2015-04-10 DIAGNOSIS — Z794 Long term (current) use of insulin: Secondary | ICD-10-CM | POA: Diagnosis not present

## 2015-04-10 DIAGNOSIS — E785 Hyperlipidemia, unspecified: Secondary | ICD-10-CM | POA: Diagnosis not present

## 2015-04-10 DIAGNOSIS — E119 Type 2 diabetes mellitus without complications: Secondary | ICD-10-CM | POA: Diagnosis not present

## 2015-04-10 DIAGNOSIS — R0789 Other chest pain: Secondary | ICD-10-CM | POA: Diagnosis not present

## 2015-04-10 DIAGNOSIS — E11649 Type 2 diabetes mellitus with hypoglycemia without coma: Secondary | ICD-10-CM | POA: Diagnosis not present

## 2015-04-10 DIAGNOSIS — I10 Essential (primary) hypertension: Secondary | ICD-10-CM | POA: Diagnosis not present

## 2015-04-14 ENCOUNTER — Encounter: Payer: Self-pay | Admitting: Neurology

## 2015-04-14 ENCOUNTER — Ambulatory Visit (INDEPENDENT_AMBULATORY_CARE_PROVIDER_SITE_OTHER): Payer: Medicare Other | Admitting: Neurology

## 2015-04-14 VITALS — BP 110/78 | HR 115 | Ht 68.5 in | Wt 140.1 lb

## 2015-04-14 DIAGNOSIS — R55 Syncope and collapse: Secondary | ICD-10-CM | POA: Diagnosis not present

## 2015-04-14 DIAGNOSIS — E162 Hypoglycemia, unspecified: Secondary | ICD-10-CM

## 2015-04-14 NOTE — Progress Notes (Signed)
Note sent

## 2015-04-14 NOTE — Patient Instructions (Signed)
Your neurological exam is normal.  I think you blacked out due to the low blood sugar.  I don't suspect any other reason.  Call with questions or concerns.
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
## 2015-07-14 DIAGNOSIS — I10 Essential (primary) hypertension: Secondary | ICD-10-CM | POA: Diagnosis not present

## 2015-07-14 DIAGNOSIS — E119 Type 2 diabetes mellitus without complications: Secondary | ICD-10-CM | POA: Diagnosis not present

## 2015-07-14 DIAGNOSIS — E785 Hyperlipidemia, unspecified: Secondary | ICD-10-CM | POA: Diagnosis not present

## 2015-07-28 ENCOUNTER — Ambulatory Visit (INDEPENDENT_AMBULATORY_CARE_PROVIDER_SITE_OTHER): Payer: Medicare Other | Admitting: Endocrinology

## 2015-07-28 ENCOUNTER — Encounter: Payer: Self-pay | Admitting: Endocrinology

## 2015-07-28 VITALS — BP 136/84 | HR 97 | Temp 98.2°F | Ht 68.5 in | Wt 151.0 lb

## 2015-07-28 DIAGNOSIS — G4700 Insomnia, unspecified: Secondary | ICD-10-CM | POA: Insufficient documentation

## 2015-07-28 MED ORDER — INSULIN REGULAR HUMAN 100 UNIT/ML IJ SOLN: 15.0000 [IU] | Freq: Three times a day (TID) | INTRAMUSCULAR | Status: DC

## 2015-07-28 NOTE — Patient Instructions (Addendum)
good diet and exercise significantly improve the control of your diabetes.  please let me know if you wish to be referred to a dietician.  high blood sugar is very risky to your health.  you should see an eye doctor and dentist every year.  It is very important to get all recommended vaccinations.  controlling your blood pressure and cholesterol drastically reduces the damage diabetes does to your body.  Those who smoke should quit.  please discuss these with your doctor.   check your blood sugar 4 times a day: before the 3 meals, and at bedtime.  also check if you have symptoms of your blood sugar being too high or too low.  please keep a record of the readings and bring it to your next appointment here (or you can bring the meter itself).  You can write it on any piece of paper.  please call us sooner if your blood sugar goes below 70, or if you have a lot of readings over 200.   For now, please subtract 5 units from whatever you would take at lunch, and add 5 to supper.  You may de developing type 1 (used to be called "child onset") diabetes, so we should keep an eye on your blood sugar.   You can stop taking the metformin. Please come back for a follow-up appointment in 3 months.

## 2015-07-28 NOTE — Progress Notes (Signed)
Subjective:    Patient ID: Christopher Welch, male    DOB: 1946-05-13, 69 y.o.   MRN: [id number]  HPI pt states DM was dx'ed in 1992; he has mild if any neuropathy of the lower extremities, but he has associated retinopathy; he has been on insulin since dx; pt says his diet and exercise are good; he has never had pancreatitis or DKA.  He has had only 1 episode of severe hypoglycemia (2007).  He takes regular insulin 3 times a day (just before each meal).  He takes 15-25 units with each meal.  He bases this on the size of the meal (he estimates--does not count CHO), and cbg (he also estimates--no exact correction factor).  He says cbg's vary from 50-200.  He has mild hypoglycemia every 3 days or so, usually in the afternoon.  It is highest at hs (am is lower).  He says he cannot afford insulin pens and/or analogs.  He says he has done better with this regimen than with lantus + humalog.  He used to take HS-NPH in addition to reg, but found he did not seem to need it.   Past Medical History  Diagnosis Date  . Diabetes mellitus   . ED (erectile dysfunction)   . Dyslipidemia   . Hepatitis B carrier   . Hypertension   . Gilbert's disease     Past Surgical History  Procedure Laterality Date  . Femoral hernia repair      Social History   Social History  . Marital Status: Divorced    Spouse Name: N/A  . Number of Children: N/A  . Years of Education: N/A   Occupational History  . Not on file.   Social History Main Topics  . Smoking status: Never Smoker   . Smokeless tobacco: Never Used  . Alcohol Use: 4.2 oz/week    7 Glasses of wine per week  . Drug Use: No  . Sexual Activity: Yes   Other Topics Concern  . Not on file   Social History Narrative   Lives alone in a one story home.  Works part time at [employer].  No children.  Education: masters in education    Current Outpatient Prescriptions on File Prior to Visit  Medication Sig Dispense Refill  . aspirin 325 MG tablet  Take 325 mg by mouth every evening.     Marland Kitchen atorvastatin (LIPITOR) 20 MG tablet Take 10 mg by mouth daily.    . Blood Glucose Monitoring Suppl (ACCU-CHEK NANO SMARTVIEW) W/DEVICE KIT 1 kit by Does not apply route 2 (two) times daily. 1 kit 0  . glucose blood test strip Test 3 times a day. 300 each Prn  . lisinopril (PRINIVIL,ZESTRIL) 10 MG tablet Take 1 tablet (10 mg total) by mouth daily. 90 tablet 4  . niacin (NIASPAN) 750 MG CR tablet Take 2 tablets by mouth at  bedtime 60 tablet 11  . OVER THE COUNTER MEDICATION Take 1 tablet by mouth at bedtime. Z-quil    . traMADol (ULTRAM) 50 MG tablet     . atorvastatin (LIPITOR) 20 MG tablet Take 1 tablet (20 mg total) by mouth daily. 90 tablet 4  . [DISCONTINUED] citalopram (CELEXA) 20 MG tablet Take 1 tablet (20 mg total) by mouth daily. 90 tablet 1  . [DISCONTINUED] clonazePAM (KLONOPIN) 0.5 MG tablet Take 0.5 mg by mouth 2 (two) times daily as needed.      . [DISCONTINUED] lamoTRIgine (LAMICTAL) 100 MG tablet Take 100 mg  by mouth daily. 1/2 TABLET QD     . [DISCONTINUED] zolpidem (AMBIEN) 10 MG tablet Take 10 mg by mouth at bedtime as needed.       No current facility-administered medications on file prior to visit.    No Known Allergies  Family History  Problem Relation Age of Onset  . Diabetes Father     BP 136/84 mmHg  Pulse 97  Temp(Src) 98.2 F (36.8 C) (Oral)  Ht 5' 8.5" (1.74 m)  Wt 151 lb (68.493 kg)  BMI 22.62 kg/m2  SpO2 97%   Review of Systems denies blurry vision, headache, chest pain, sob, n/v, urinary frequency, muscle cramps, excessive diaphoresis, cold intolerance, rhinorrhea, and easy bruising.  He has recently lost 15 lbs (believes due to metformin).  He has depression.      Objective:   Physical Exam VS: see vs page GEN: no distress HEAD: head: no deformity eyes: no periorbital swelling, no proptosis external nose and ears are normal mouth: no lesion seen NECK: supple, thyroid is not enlarged CHEST WALL: no  deformity LUNGS: clear to auscultation BREASTS:  No gynecomastia CV: reg rate and rhythm, no murmur ABD: abdomen is soft, nontender.  no hepatosplenomegaly.  not distended.  no hernia MUSCULOSKELETAL: muscle bulk and strength are grossly normal.  no obvious joint swelling.  gait is normal and steady EXTEMITIES: no deformity.  no ulcer on the feet.  feet are of normal color and temp.  no edema PULSES: dorsalis pedis intact bilat.  no carotid bruit NEURO:  cn 2-12 grossly intact.   readily moves all 4's.  sensation is intact to touch on the feet SKIN:  Normal texture and temperature.  No rash or suspicious lesion is visible.   NODES:  None palpable at the neck PSYCH: alert, well-oriented.  Does not appear anxious nor depressed.  I have reviewed outside records, and summarized: Pt was noted to have elevated a1c, and referred here.  outside test results are reviewed: A1c=7.2%  i personally reviewed electrocardiogram tracing (04/01/15): Indication: HTN.  Impression: normal.     Assessment & Plan:  type1 DM: he needs increased rx, if it can be done with a regimen that avoids or minimizes hypoglycemia.    Patient is advised the following: Patient Instructions  good diet and exercise significantly improve the control of your diabetes.  please let me know if you wish to be referred to a dietician.  high blood sugar is very risky to your health.  you should see an eye doctor and dentist every year.  It is very important to get all recommended vaccinations.  controlling your blood pressure and cholesterol drastically reduces the damage diabetes does to your body.  Those who smoke should quit.  please discuss these with your doctor.   check your blood sugar 4 times a day: before the 3 meals, and at bedtime.  also check if you have symptoms of your blood sugar being too high or too low.  please keep a record of the readings and bring it to your next appointment here (or you can bring the meter  itself).  You can write it on any piece of paper.  please call us sooner if your blood sugar goes below 70, or if you have a lot of readings over 200.   For now, please subtract 5 units from whatever you would take at lunch, and add 5 to supper.  You may de developing type 1 (used to be called "child onset") diabetes, so   we should keep an eye on your blood sugar.   You can stop taking the metformin. Please come back for a follow-up appointment in 3 months.

## 2015-10-05 DIAGNOSIS — H2513 Age-related nuclear cataract, bilateral: Secondary | ICD-10-CM | POA: Diagnosis not present

## 2015-10-05 DIAGNOSIS — H524 Presbyopia: Secondary | ICD-10-CM | POA: Diagnosis not present

## 2015-10-13 DIAGNOSIS — I10 Essential (primary) hypertension: Secondary | ICD-10-CM | POA: Diagnosis not present

## 2015-10-13 DIAGNOSIS — E119 Type 2 diabetes mellitus without complications: Secondary | ICD-10-CM | POA: Diagnosis not present

## 2015-10-13 DIAGNOSIS — Z1211 Encounter for screening for malignant neoplasm of colon: Secondary | ICD-10-CM | POA: Diagnosis not present

## 2015-10-13 DIAGNOSIS — Z125 Encounter for screening for malignant neoplasm of prostate: Secondary | ICD-10-CM | POA: Diagnosis not present

## 2015-10-13 DIAGNOSIS — Z7984 Long term (current) use of oral hypoglycemic drugs: Secondary | ICD-10-CM | POA: Diagnosis not present

## 2015-10-13 DIAGNOSIS — E785 Hyperlipidemia, unspecified: Secondary | ICD-10-CM | POA: Diagnosis not present

## 2015-10-13 DIAGNOSIS — Z0000 Encounter for general adult medical examination without abnormal findings: Secondary | ICD-10-CM | POA: Diagnosis not present

## 2015-10-20 DIAGNOSIS — Z1211 Encounter for screening for malignant neoplasm of colon: Secondary | ICD-10-CM | POA: Diagnosis not present

## 2015-10-25 DIAGNOSIS — N289 Disorder of kidney and ureter, unspecified: Secondary | ICD-10-CM | POA: Diagnosis not present

## 2015-10-27 ENCOUNTER — Encounter: Payer: Self-pay | Admitting: Endocrinology

## 2015-10-27 ENCOUNTER — Ambulatory Visit (INDEPENDENT_AMBULATORY_CARE_PROVIDER_SITE_OTHER): Payer: Medicare Other | Admitting: Endocrinology

## 2015-10-27 VITALS — BP 132/70 | HR 100 | Temp 97.9°F | Ht 68.5 in | Wt 157.0 lb

## 2015-10-27 DIAGNOSIS — E109 Type 1 diabetes mellitus without complications: Secondary | ICD-10-CM

## 2015-10-27 LAB — POCT GLYCOSYLATED HEMOGLOBIN (HGB A1C): Hemoglobin A1C: 7.9

## 2015-10-27 NOTE — Patient Instructions (Addendum)
check your blood sugar 4 times a day: before the 3 meals, and at bedtime.  also check if you have symptoms of your blood sugar being too high or too low.  please keep a record of the readings and bring it to your next appointment here (or you can bring the meter itself).  You can write it on any piece of paper.  please call us sooner if your blood sugar goes below 70, or if you have a lot of readings over 200.   Please push the lunch insulin to 30 units.  You may be developing type 1 (used to be called "childhood onset") diabetes, so we should keep an eye on your blood sugar.   Please come back for a follow-up appointment in 3 months.

## 2015-10-27 NOTE — Progress Notes (Signed)
Subjective:    Patient ID: Christopher Welch, male    DOB: 1946-02-16, 70 y.o.   MRN: [id number]  HPI Pt returns for f/u of diabetes mellitus: DM type: he is presumed to be developing type 1.  Dx'ed: 2355 Complications: retinopathy.  Therapy: insulin since dx.  DKA: never Severe hypoglycemia: once (2007) Pancreatitis: never Other: He says he cannot afford insulin pens and/or analogs; He takes regular insulin 3 times a day (just before each meal).  He takes 15-25 units with each meal.  He bases this on the size of the meal (he estimates--does not count CHO), and cbg (he also estimates--no exact correction factor).  He says he has done better with this regimen than with lantus + humalog.  He used to take HS-NPH in addition to reg, but found he did not seem to need it.   Interval history: he has mild hypoglycemia approx once every 2-3 weeks. no cbg record, but states cbg's are highest in the afternoon, and lowest at lunch or at HS. He averages a total of approx 75 total units per day (approx 25 units 3 times a day (just before each meal).  He says hs-cbg is still higher than am.   Past Medical History  Diagnosis Date  . Diabetes mellitus   . ED (erectile dysfunction)   . Dyslipidemia   . Hepatitis B carrier   . Hypertension   . Gilbert's disease     Past Surgical History  Procedure Laterality Date  . Femoral hernia repair      Social History   Social History  . Marital Status: Divorced    Spouse Name: N/A  . Number of Children: N/A  . Years of Education: N/A   Occupational History  . Not on file.   Social History Main Topics  . Smoking status: Never Smoker   . Smokeless tobacco: Never Used  . Alcohol Use: 4.2 oz/week    7 Glasses of wine per week  . Drug Use: No  . Sexual Activity: Yes   Other Topics Concern  . Not on file   Social History Narrative   Lives alone in a one story home.  Works part time at [employer].  No children.  Education: masters in education     Current Outpatient Prescriptions on File Prior to Visit  Medication Sig Dispense Refill  . aspirin 325 MG tablet Take 325 mg by mouth every evening.     . Blood Glucose Monitoring Suppl (ACCU-CHEK NANO SMARTVIEW) W/DEVICE KIT 1 kit by Does not apply route 2 (two) times daily. 1 kit 0  . glucose blood test strip Test 3 times a day. 300 each Prn  . lisinopril (PRINIVIL,ZESTRIL) 10 MG tablet Take 1 tablet (10 mg total) by mouth daily. 90 tablet 4  . OVER THE COUNTER MEDICATION Take 1 tablet by mouth at bedtime. Z-quil    . traMADol (ULTRAM) 50 MG tablet Reported on 10/27/2015    . [DISCONTINUED] citalopram (CELEXA) 20 MG tablet Take 1 tablet (20 mg total) by mouth daily. 90 tablet 1  . [DISCONTINUED] clonazePAM (KLONOPIN) 0.5 MG tablet Take 0.5 mg by mouth 2 (two) times daily as needed.      . [DISCONTINUED] lamoTRIgine (LAMICTAL) 100 MG tablet Take 100 mg by mouth daily. 1/2 TABLET QD     . [DISCONTINUED] zolpidem (AMBIEN) 10 MG tablet Take 10 mg by mouth at bedtime as needed.       No current facility-administered medications on file prior  to visit.    No Known Allergies  Family History  Problem Relation Age of Onset  . Diabetes Father     BP 132/70 mmHg  Pulse 100  Temp(Src) 97.9 F (36.6 C) (Oral)  Ht 5' 8.5" (1.74 m)  Wt 157 lb (71.215 kg)  BMI 23.52 kg/m2  SpO2 97%  Review of Systems Denies LOC.  He has gained weight.      Objective:   Physical Exam VITAL SIGNS:  See vs page GENERAL: no distress SKIN:  Insulin injection sites at the anterior abdomen are normal,cept for a few ecchymoses.   A1c=7.9%    Assessment & Plan:  DM: he needs increased rx.  The pattern of cbg's indicates he does not need basal insulin.     Patient is advised the following: Patient Instructions  check your blood sugar 4 times a day: before the 3 meals, and at bedtime.  also check if you have symptoms of your blood sugar being too high or too low.  please keep a record of the readings  and bring it to your next appointment here (or you can bring the meter itself).  You can write it on any piece of paper.  please call us sooner if your blood sugar goes below 70, or if you have a lot of readings over 200.   Please push the lunch insulin to 30 units.  You may be developing type 1 (used to be called "childhood onset") diabetes, so we should keep an eye on your blood sugar.   Please come back for a follow-up appointment in 3 months.

## 2015-11-07 ENCOUNTER — Encounter: Payer: Self-pay | Admitting: Endocrinology

## 2015-11-08 ENCOUNTER — Encounter: Payer: Self-pay | Admitting: Endocrinology

## 2015-11-08 DIAGNOSIS — Z7689 Persons encountering health services in other specified circumstances: Secondary | ICD-10-CM

## 2015-11-13 DIAGNOSIS — E161 Other hypoglycemia: Secondary | ICD-10-CM | POA: Diagnosis not present

## 2015-11-13 DIAGNOSIS — R7309 Other abnormal glucose: Secondary | ICD-10-CM | POA: Diagnosis not present

## 2015-11-16 ENCOUNTER — Encounter (HOSPITAL_COMMUNITY): Payer: Self-pay | Admitting: Emergency Medicine

## 2015-11-16 ENCOUNTER — Emergency Department (HOSPITAL_COMMUNITY)
Admission: EM | Admit: 2015-11-16 | Discharge: 2015-11-17 | Disposition: A | Discharge status: Home / Self Care | Payer: Medicare Other | Attending: Emergency Medicine | Admitting: Emergency Medicine

## 2015-11-16 DIAGNOSIS — Z79899 Other long term (current) drug therapy: Secondary | ICD-10-CM | POA: Insufficient documentation

## 2015-11-16 DIAGNOSIS — E119 Type 2 diabetes mellitus without complications: Secondary | ICD-10-CM | POA: Diagnosis not present

## 2015-11-16 DIAGNOSIS — Y9289 Other specified places as the place of occurrence of the external cause: Secondary | ICD-10-CM | POA: Insufficient documentation

## 2015-11-16 DIAGNOSIS — E785 Hyperlipidemia, unspecified: Secondary | ICD-10-CM | POA: Diagnosis not present

## 2015-11-16 DIAGNOSIS — S0990XA Unspecified injury of head, initial encounter: Secondary | ICD-10-CM | POA: Diagnosis present

## 2015-11-16 DIAGNOSIS — S20311A Abrasion of right front wall of thorax, initial encounter: Secondary | ICD-10-CM | POA: Insufficient documentation

## 2015-11-16 DIAGNOSIS — S0181XA Laceration without foreign body of other part of head, initial encounter: Secondary | ICD-10-CM | POA: Diagnosis not present

## 2015-11-16 DIAGNOSIS — Z7982 Long term (current) use of aspirin: Secondary | ICD-10-CM | POA: Insufficient documentation

## 2015-11-16 DIAGNOSIS — I10 Essential (primary) hypertension: Secondary | ICD-10-CM | POA: Insufficient documentation

## 2015-11-16 DIAGNOSIS — S20211A Contusion of right front wall of thorax, initial encounter: Secondary | ICD-10-CM

## 2015-11-16 DIAGNOSIS — W01198A Fall on same level from slipping, tripping and stumbling with subsequent striking against other object, initial encounter: Secondary | ICD-10-CM | POA: Insufficient documentation

## 2015-11-16 DIAGNOSIS — Y998 Other external cause status: Secondary | ICD-10-CM | POA: Diagnosis not present

## 2015-11-16 DIAGNOSIS — Z8619 Personal history of other infectious and parasitic diseases: Secondary | ICD-10-CM | POA: Insufficient documentation

## 2015-11-16 DIAGNOSIS — Y9389 Activity, other specified: Secondary | ICD-10-CM | POA: Diagnosis not present

## 2015-11-16 DIAGNOSIS — Z87438 Personal history of other diseases of male genital organs: Secondary | ICD-10-CM | POA: Diagnosis not present

## 2015-11-16 DIAGNOSIS — Z794 Long term (current) use of insulin: Secondary | ICD-10-CM | POA: Insufficient documentation

## 2015-11-16 LAB — CBG MONITORING, ED: GLUCOSE-CAPILLARY: 352 mg/dL — AB (ref 65–99)

## 2015-11-16 NOTE — ED Notes (Signed)
Patient presents for fall with head laceration, reports he thinks his CBG dropped and he hit his head on a door frame. Bleeding controlled in triage. Patient had PO fluids and a sandwich before coming to ED. CBG was 54 at home. A&O x4 currently. Rates pain 1/10. Denies visual changes currently.

## 2015-11-17 MED ORDER — LIDOCAINE-EPINEPHRINE 2 %-1:100000 IJ SOLN
20.0000 mL | Freq: Once | INTRAMUSCULAR | Status: DC
  Filled 2015-11-17: qty 1

## 2015-11-17 NOTE — ED Notes (Signed)
Provider in room  

## 2015-11-17 NOTE — ED Provider Notes (Signed)
CSN: [id number]     Arrival date & time 11/16/15  2100 History  By signing my name below, I, Nicole Kindred, attest that this documentation has been prepared under the direction and in the presence of Orpah Greek, MD.   Electronically Signed: Nicole Kindred, ED Scribe. 11/17/2015. 2:59 AM     Chief Complaint  Patient presents with  . Fall  . Head Injury     The history is provided by the patient. No language interpreter was used.   HPI Comments: Christopher Welch is a 69 y.o. male with PMHx of IDDM who presents to the Emergency Department complaining of sudden onset, fall in which he hit his head, onset earlier tonight. Pt states that he fell collapsed due to low blood sugar and that he recently had his insulin dosage increased to 30 units by his new endocrinologists. Pt states that his blood sugar was abnormally high before lunch today so he took an extra shot of insulin and believes this may have contributed to his hypoglycemia. No other worsening or alleviating factors noted. Pt denies any other pertinent symptoms. Pt is up to date on his tetanus shot.   Past Medical History  Diagnosis Date  . Diabetes mellitus   . ED (erectile dysfunction)   . Dyslipidemia   . Hepatitis B carrier   . Hypertension   . Gilbert's disease    Past Surgical History  Procedure Laterality Date  . Femoral hernia repair     Family History  Problem Relation Age of Onset  . Diabetes Father    Social History  Substance Use Topics  . Smoking status: Never Smoker   . Smokeless tobacco: Never Used  . Alcohol Use: 4.2 oz/week    7 Glasses of wine per week    Review of Systems  Skin: Positive for wound.  All other systems reviewed and are negative.     Allergies  Review of patient's allergies indicates no known allergies.  Home Medications   Prior to Admission medications   Medication Sig Start Date End Date Taking? Authorizing Provider  aspirin 325 MG tablet Take 325 mg by  mouth every evening.    Yes Historical Provider, MD  atorvastatin (LIPITOR) 20 MG tablet Take 20 mg by mouth daily. 11/06/15  Yes Historical Provider, MD  Blood Glucose Monitoring Suppl (ACCU-CHEK NANO SMARTVIEW) W/DEVICE KIT 1 kit by Does not apply route 2 (two) times daily. 10/23/12  Yes Denita Lung, MD  glucose blood test strip Test 3 times a day. 10/26/12  Yes Denita Lung, MD  insulin regular (NOVOLIN R,HUMULIN R) 100 units/mL injection Inject 25-30 Units into the skin 3 (three) times daily before meals. 3 times a day (just before each meal) 25-30-25 units   Yes Historical Provider, MD  lisinopril (PRINIVIL,ZESTRIL) 10 MG tablet Take 1 tablet (10 mg total) by mouth daily. 12/31/12  Yes Denita Lung, MD  niacin (SLO-NIACIN) 500 MG tablet Take 500 mg by mouth at bedtime.   Yes Historical Provider, MD  OVER THE COUNTER MEDICATION Take 1 tablet by mouth at bedtime. Z-quil   Yes Historical Provider, MD   BP 152/86 mmHg  Pulse 117  Temp(Src) 98.4 F (36.9 C) (Oral)  Resp 18  Ht _0  (1.753 m)  Wt 157 lb (71.215 kg)  BMI 23.17 kg/m2  SpO2 100% Physical Exam  Constitutional: He is oriented to person, place, and time. He appears well-developed and well-nourished. No distress.  HENT:  Head: Normocephalic and  atraumatic.  Right Ear: Hearing normal.  Left Ear: Hearing normal.  Nose: Nose normal.  Mouth/Throat: Oropharynx is clear and moist and mucous membranes are normal.  Eyes: Conjunctivae and EOM are normal. Pupils are equal, round, and reactive to light.  Neck: Normal range of motion. Neck supple.  Cardiovascular: Regular rhythm, S1 normal and S2 normal.  Exam reveals no gallop and no friction rub.   No murmur heard. Pulmonary/Chest: Effort normal and breath sounds normal. No respiratory distress. He exhibits no tenderness.  Abdominal: Soft. Normal appearance and bowel sounds are normal. There is no hepatosplenomegaly. There is no tenderness. There is no rebound, no guarding, no  tenderness at McBurney's point and negative Murphy's sign. No hernia.  Musculoskeletal: Normal range of motion.  Neurological: He is alert and oriented to person, place, and time. He has normal strength. No cranial nerve deficit or sensory deficit. Coordination normal. GCS eye subscore is 4. GCS verbal subscore is 5. GCS motor subscore is 6.  Skin: Skin is warm and dry. Abrasion, bruising and laceration noted. No rash noted. No cyanosis.  Abrasion and contusion right chest wall. No tenderness.  2 cm vertical laceration to the right forehead.   Psychiatric: He has a normal mood and affect. His speech is normal and behavior is normal. Thought content normal.  Nursing note and vitals reviewed.   ED Course  Procedures (including critical care time) DIAGNOSTIC STUDIES: Oxygen Saturation is 100% on RA, normal by my interpretation.    COORDINATION OF CARE: 1:53 AM-Discussed treatment plan which includes suturing with pt at bedside and pt agreed to plan.   LACERATION REPAIR Performed by: Orpah Greek, MD. Consent: Verbal consent obtained. Risks and benefits: risks, benefits and alternatives were discussed Patient identity confirmed: provided demographic data Time out performed prior to procedure Prepped and Draped in normal sterile fashion Wound explored Laceration Location: Right forehead  Laceration Length: 2 cm No Foreign Bodies seen or palpated Anesthesia: local infiltration Local anesthetic: lidocaine 2% with epinephrine Anesthetic total: 20 ml Irrigation method: syringe Amount of cleaning: standard Skin closure: sutures Number of sutures or staples: 3 Technique: simple interrupted, 5-0 Prolene Patient tolerance: Patient tolerated the procedure well with no immediate complications.  Labs Review Labs Reviewed  CBG MONITORING, ED - Abnormal; Notable for the following:    Glucose-Capillary 352 (*)    All other components within normal limits    Imaging Review No  results found.    EKG Interpretation None      MDM   Final diagnoses:  Facial laceration, initial encounter  Chest wall contusion, right, initial encounter   Patient presents to the ER for evaluation of head injury. Patient fell when he had a hypoglycemic episode. Patient reports that he recently had his insulin dosing increased. He checked his blood sugar this afternoon and it was 250 after he took his insulin dose, took an additional shot of insulin and took a nap. When he woke up at 7:00 he was disoriented. He fell and hit his head. He suffered a laceration on his forehead. At arrival to the ER he is awake, alert and oriented. He reports only minimal headache. There was an extended wait in the waiting room, by the time I evaluated him it was many hours after the injury and he was doing well, does not require CT of head. He has a superficial abrasion on his chest, but is breathing comfortably and has no tenderness. No imaging needed. Sutures were placed. He will see his  doctor for removal in 5 or 6 days.  I personally performed the services described in this documentation, which was scribed in my presence. The recorded information has been reviewed and is accurate.    Orpah Greek, MD 11/17/15 [phone]

## 2015-11-17 NOTE — Discharge Instructions (Signed)
Facial Laceration ° A facial laceration is a cut on the face. These injuries can be painful and cause bleeding. Lacerations usually heal quickly, but they need special care to reduce scarring. °DIAGNOSIS  °Your health care provider will take a medical history, ask for details about how the injury occurred, and examine the wound to determine how deep the cut is. °TREATMENT  °Some facial lacerations may not require closure. Others may not be able to be closed because of an increased risk of infection. The risk of infection and the chance for successful closure will depend on various factors, including the amount of time since the injury occurred. °The wound may be cleaned to help prevent infection. If closure is appropriate, pain medicines may be given if needed. Your health care provider will use stitches (sutures), wound glue (adhesive), or skin adhesive strips to repair the laceration. These tools bring the skin edges together to allow for faster healing and a better cosmetic outcome. If needed, you may also be given a tetanus shot. °HOME CARE INSTRUCTIONS °· Only take over-the-counter or prescription medicines as directed by your health care provider. °· Follow your health care provider's instructions for wound care. These instructions will vary depending on the technique used for closing the wound. °For Sutures: °· Keep the wound clean and dry.   °· If you were given a bandage (dressing), you should change it at least once a day. Also change the dressing if it becomes wet or dirty, or as directed by your health care provider.   °· Wash the wound with soap and water 2 times a day. Rinse the wound off with water to remove all soap. Pat the wound dry with a clean towel.   °· After cleaning, apply a thin layer of the antibiotic ointment recommended by your health care provider. This will help prevent infection and keep the dressing from sticking.   °· You may shower as usual after the first 24 hours. Do not soak the  wound in water until the sutures are removed.   °· Get your sutures removed as directed by your health care provider. With facial lacerations, sutures should usually be taken out after 4-5 days to avoid stitch marks.   °· Wait a few days after your sutures are removed before applying any makeup. °For Skin Adhesive Strips: °· Keep the wound clean and dry.   °· Do not get the skin adhesive strips wet. You may bathe carefully, using caution to keep the wound dry.   °· If the wound gets wet, pat it dry with a clean towel.   °· Skin adhesive strips will fall off on their own. You may trim the strips as the wound heals. Do not remove skin adhesive strips that are still stuck to the wound. They will fall off in time.   °For Wound Adhesive: °· You may briefly wet your wound in the shower or bath. Do not soak or scrub the wound. Do not swim. Avoid periods of heavy sweating until the skin adhesive has fallen off on its own. After showering or bathing, gently pat the wound dry with a clean towel.   °· Do not apply liquid medicine, cream medicine, ointment medicine, or makeup to your wound while the skin adhesive is in place. This may loosen the film before your wound is healed.   °· If a dressing is placed over the wound, be careful not to apply tape directly over the skin adhesive. This may cause the adhesive to be pulled off before the wound is healed.   °· Avoid   prolonged exposure to sunlight or tanning lamps while the skin adhesive is in place.  The skin adhesive will usually remain in place for 5-10 days, then naturally fall off the skin. Do not pick at the adhesive film.  After Healing: Once the wound has healed, cover the wound with sunscreen during the day for 1 full year. This can help minimize scarring. Exposure to ultraviolet light in the first year will darken the scar. It can take 1-2 years for the scar to lose its redness and to heal completely.  SEEK MEDICAL CARE IF:  You have a fever. SEEK IMMEDIATE  MEDICAL CARE IF:  You have redness, pain, or swelling around the wound.   You see ayellowish-white fluid (pus) coming from the wound.    This information is not intended to replace advice given to you by your health care provider. Make sure you discuss any questions you have with your health care provider.   Document Released: 10/24/2004 Document Revised: 10/07/2014 Document Reviewed: 04/29/2013 Elsevier Interactive Patient Education 2016 [place].  Chest Contusion A chest contusion is a deep bruise on your chest area. Contusions are the result of an injury that caused bleeding under the skin. A chest contusion may involve bruising of the skin, muscles, or ribs. The contusion may turn blue, purple, or yellow. Minor injuries will give you a painless contusion, but more severe contusions may stay painful and swollen for a few weeks. CAUSES  A contusion is usually caused by a blow, trauma, or direct force to an area of the body. SYMPTOMS   Swelling and redness of the injured area.  Discoloration of the injured area.  Tenderness and soreness of the injured area.  Pain. DIAGNOSIS  The diagnosis can be made by taking a history and performing a physical exam. An X-ray, CT scan, or MRI may be needed to determine if there were any associated injuries, such as broken bones (fractures) or internal injuries. TREATMENT  Often, the best treatment for a chest contusion is resting, icing, and applying cold compresses to the injured area. Deep breathing exercises may be recommended to reduce the risk of pneumonia. Over-the-counter medicines may also be recommended for pain control. HOME CARE INSTRUCTIONS   Put ice on the injured area.  Put ice in a plastic bag.  Place a towel between your skin and the bag.  Leave the ice on for 15-20 minutes, 03-04 times a day.  Only take over-the-counter or prescription medicines as directed by your caregiver. Your caregiver may recommend avoiding  anti-inflammatory medicines (aspirin, ibuprofen, and naproxen) for 48 hours because these medicines may increase bruising.  Rest the injured area.  Perform deep-breathing exercises as directed by your caregiver.  Stop smoking if you smoke.  Do not lift objects over 5 pounds (2.3 kg) for 3 days or longer if recommended by your caregiver. SEEK IMMEDIATE MEDICAL CARE IF:   You have increased bruising or swelling.  You have pain that is getting worse.  You have difficulty breathing.  You have dizziness, weakness, or fainting.  You have blood in your urine or stool.  You cough up or vomit blood.  Your swelling or pain is not relieved with medicines. MAKE SURE YOU:   Understand these instructions.  Will watch your condition.  Will get help right away if you are not doing well or get worse.   This information is not intended to replace advice given to you by your health care provider. Make sure you discuss any questions  you have with your health care provider.   Document Released: 06/11/2001 Document Revised: 06/10/2012 Document Reviewed: 03/09/2012 Elsevier Interactive Patient Education [employer].

## 2015-11-20 DIAGNOSIS — N289 Disorder of kidney and ureter, unspecified: Secondary | ICD-10-CM | POA: Diagnosis not present

## 2015-11-24 DIAGNOSIS — E119 Type 2 diabetes mellitus without complications: Secondary | ICD-10-CM | POA: Diagnosis not present

## 2015-11-24 DIAGNOSIS — Z4802 Encounter for removal of sutures: Secondary | ICD-10-CM | POA: Diagnosis not present

## 2015-11-24 DIAGNOSIS — E162 Hypoglycemia, unspecified: Secondary | ICD-10-CM | POA: Diagnosis not present

## 2015-12-08 DIAGNOSIS — E785 Hyperlipidemia, unspecified: Secondary | ICD-10-CM | POA: Diagnosis not present

## 2015-12-08 DIAGNOSIS — Z794 Long term (current) use of insulin: Secondary | ICD-10-CM | POA: Diagnosis not present

## 2015-12-08 DIAGNOSIS — Z87891 Personal history of nicotine dependence: Secondary | ICD-10-CM | POA: Diagnosis not present

## 2015-12-08 DIAGNOSIS — Z7982 Long term (current) use of aspirin: Secondary | ICD-10-CM | POA: Diagnosis not present

## 2015-12-08 DIAGNOSIS — I10 Essential (primary) hypertension: Secondary | ICD-10-CM | POA: Diagnosis not present

## 2015-12-08 DIAGNOSIS — E109 Type 1 diabetes mellitus without complications: Secondary | ICD-10-CM | POA: Diagnosis not present

## 2016-01-25 ENCOUNTER — Ambulatory Visit: Payer: Medicare Other | Admitting: Endocrinology

## 2016-04-09 DIAGNOSIS — Z7982 Long term (current) use of aspirin: Secondary | ICD-10-CM | POA: Diagnosis not present

## 2016-04-09 DIAGNOSIS — Z87891 Personal history of nicotine dependence: Secondary | ICD-10-CM | POA: Diagnosis not present

## 2016-04-09 DIAGNOSIS — E785 Hyperlipidemia, unspecified: Secondary | ICD-10-CM | POA: Diagnosis not present

## 2016-04-09 DIAGNOSIS — I10 Essential (primary) hypertension: Secondary | ICD-10-CM | POA: Diagnosis not present

## 2016-04-09 DIAGNOSIS — E109 Type 1 diabetes mellitus without complications: Secondary | ICD-10-CM | POA: Diagnosis not present

## 2016-04-09 DIAGNOSIS — Z794 Long term (current) use of insulin: Secondary | ICD-10-CM | POA: Diagnosis not present

## 2016-04-09 DIAGNOSIS — N529 Male erectile dysfunction, unspecified: Secondary | ICD-10-CM | POA: Diagnosis not present

## 2016-04-09 DIAGNOSIS — Z79899 Other long term (current) drug therapy: Secondary | ICD-10-CM | POA: Diagnosis not present

## 2016-04-11 DIAGNOSIS — E109 Type 1 diabetes mellitus without complications: Secondary | ICD-10-CM | POA: Diagnosis not present

## 2016-04-11 DIAGNOSIS — N529 Male erectile dysfunction, unspecified: Secondary | ICD-10-CM | POA: Diagnosis not present

## 2016-04-16 DIAGNOSIS — Z713 Dietary counseling and surveillance: Secondary | ICD-10-CM | POA: Diagnosis not present

## 2016-04-16 DIAGNOSIS — Z9641 Presence of insulin pump (external) (internal): Secondary | ICD-10-CM | POA: Diagnosis not present

## 2016-04-16 DIAGNOSIS — E109 Type 1 diabetes mellitus without complications: Secondary | ICD-10-CM | POA: Diagnosis not present

## 2016-04-16 DIAGNOSIS — Z794 Long term (current) use of insulin: Secondary | ICD-10-CM | POA: Diagnosis not present

## 2016-05-14 DIAGNOSIS — N521 Erectile dysfunction due to diseases classified elsewhere: Secondary | ICD-10-CM | POA: Diagnosis not present

## 2016-05-14 DIAGNOSIS — E109 Type 1 diabetes mellitus without complications: Secondary | ICD-10-CM | POA: Diagnosis not present

## 2016-05-20 DIAGNOSIS — E1069 Type 1 diabetes mellitus with other specified complication: Secondary | ICD-10-CM | POA: Diagnosis not present

## 2016-05-20 DIAGNOSIS — Z9641 Presence of insulin pump (external) (internal): Secondary | ICD-10-CM | POA: Diagnosis not present

## 2016-05-20 DIAGNOSIS — Z794 Long term (current) use of insulin: Secondary | ICD-10-CM | POA: Diagnosis not present

## 2016-05-23 DIAGNOSIS — E109 Type 1 diabetes mellitus without complications: Secondary | ICD-10-CM | POA: Diagnosis not present

## 2016-05-23 DIAGNOSIS — Z9641 Presence of insulin pump (external) (internal): Secondary | ICD-10-CM | POA: Diagnosis not present

## 2016-08-13 DIAGNOSIS — Z79899 Other long term (current) drug therapy: Secondary | ICD-10-CM | POA: Diagnosis not present

## 2016-08-13 DIAGNOSIS — E785 Hyperlipidemia, unspecified: Secondary | ICD-10-CM | POA: Diagnosis not present

## 2016-08-13 DIAGNOSIS — I10 Essential (primary) hypertension: Secondary | ICD-10-CM | POA: Diagnosis not present

## 2016-08-13 DIAGNOSIS — Z87891 Personal history of nicotine dependence: Secondary | ICD-10-CM | POA: Diagnosis not present

## 2016-08-13 DIAGNOSIS — Z7982 Long term (current) use of aspirin: Secondary | ICD-10-CM | POA: Diagnosis not present

## 2016-08-13 DIAGNOSIS — E119 Type 2 diabetes mellitus without complications: Secondary | ICD-10-CM | POA: Diagnosis not present

## 2016-08-13 DIAGNOSIS — Z794 Long term (current) use of insulin: Secondary | ICD-10-CM | POA: Diagnosis not present

## 2016-08-13 DIAGNOSIS — Z9641 Presence of insulin pump (external) (internal): Secondary | ICD-10-CM | POA: Diagnosis not present

## 2016-08-13 DIAGNOSIS — E109 Type 1 diabetes mellitus without complications: Secondary | ICD-10-CM | POA: Diagnosis not present

## 2016-10-10 DIAGNOSIS — H25813 Combined forms of age-related cataract, bilateral: Secondary | ICD-10-CM | POA: Diagnosis not present

## 2016-10-10 DIAGNOSIS — E119 Type 2 diabetes mellitus without complications: Secondary | ICD-10-CM | POA: Diagnosis not present

## 2016-10-10 DIAGNOSIS — H524 Presbyopia: Secondary | ICD-10-CM | POA: Diagnosis not present

## 2016-10-31 DIAGNOSIS — Z0000 Encounter for general adult medical examination without abnormal findings: Secondary | ICD-10-CM | POA: Diagnosis not present

## 2016-10-31 DIAGNOSIS — Z23 Encounter for immunization: Secondary | ICD-10-CM | POA: Diagnosis not present

## 2016-10-31 DIAGNOSIS — Z125 Encounter for screening for malignant neoplasm of prostate: Secondary | ICD-10-CM | POA: Diagnosis not present

## 2016-10-31 DIAGNOSIS — E785 Hyperlipidemia, unspecified: Secondary | ICD-10-CM | POA: Diagnosis not present

## 2016-10-31 DIAGNOSIS — Z7984 Long term (current) use of oral hypoglycemic drugs: Secondary | ICD-10-CM | POA: Diagnosis not present

## 2016-10-31 DIAGNOSIS — I10 Essential (primary) hypertension: Secondary | ICD-10-CM | POA: Diagnosis not present

## 2016-10-31 DIAGNOSIS — E119 Type 2 diabetes mellitus without complications: Secondary | ICD-10-CM | POA: Diagnosis not present

## 2016-11-14 DIAGNOSIS — Z87891 Personal history of nicotine dependence: Secondary | ICD-10-CM | POA: Diagnosis not present

## 2016-11-14 DIAGNOSIS — E119 Type 2 diabetes mellitus without complications: Secondary | ICD-10-CM | POA: Diagnosis not present

## 2016-11-14 DIAGNOSIS — Z79899 Other long term (current) drug therapy: Secondary | ICD-10-CM | POA: Diagnosis not present

## 2016-11-14 DIAGNOSIS — Z794 Long term (current) use of insulin: Secondary | ICD-10-CM | POA: Diagnosis not present

## 2016-11-14 DIAGNOSIS — E109 Type 1 diabetes mellitus without complications: Secondary | ICD-10-CM | POA: Diagnosis not present

## 2016-11-14 DIAGNOSIS — E785 Hyperlipidemia, unspecified: Secondary | ICD-10-CM | POA: Diagnosis not present

## 2016-11-14 DIAGNOSIS — Z9641 Presence of insulin pump (external) (internal): Secondary | ICD-10-CM | POA: Diagnosis not present

## 2016-11-14 DIAGNOSIS — I10 Essential (primary) hypertension: Secondary | ICD-10-CM | POA: Diagnosis not present

## 2016-11-14 DIAGNOSIS — Z7982 Long term (current) use of aspirin: Secondary | ICD-10-CM | POA: Diagnosis not present

## 2016-11-21 DIAGNOSIS — Z23 Encounter for immunization: Secondary | ICD-10-CM | POA: Diagnosis not present

## 2017-01-31 DIAGNOSIS — R972 Elevated prostate specific antigen [PSA]: Secondary | ICD-10-CM | POA: Diagnosis not present

## 2017-02-11 DIAGNOSIS — Z7982 Long term (current) use of aspirin: Secondary | ICD-10-CM | POA: Diagnosis not present

## 2017-02-11 DIAGNOSIS — E119 Type 2 diabetes mellitus without complications: Secondary | ICD-10-CM | POA: Diagnosis not present

## 2017-02-11 DIAGNOSIS — E109 Type 1 diabetes mellitus without complications: Secondary | ICD-10-CM | POA: Diagnosis not present

## 2017-02-11 DIAGNOSIS — E785 Hyperlipidemia, unspecified: Secondary | ICD-10-CM | POA: Diagnosis not present

## 2017-02-11 DIAGNOSIS — I10 Essential (primary) hypertension: Secondary | ICD-10-CM | POA: Diagnosis not present

## 2017-03-10 DIAGNOSIS — E119 Type 2 diabetes mellitus without complications: Secondary | ICD-10-CM | POA: Diagnosis not present

## 2017-03-10 DIAGNOSIS — I10 Essential (primary) hypertension: Secondary | ICD-10-CM | POA: Diagnosis not present

## 2017-04-08 ENCOUNTER — Telehealth: Payer: Self-pay

## 2017-04-08 NOTE — Telephone Encounter (Signed)
SENT NOTES TO SCHEDULING 

## 2017-04-11 ENCOUNTER — Telehealth: Payer: Self-pay | Admitting: Cardiology

## 2017-04-11 NOTE — Telephone Encounter (Signed)
Received records from [facility] for appointment on 04/17/17 with Dr Ellyn Hack.  Records put with Dr Allison Quarry schedule for 04/17/17. lp

## 2017-04-17 ENCOUNTER — Encounter: Payer: Self-pay | Admitting: Cardiology

## 2017-04-17 ENCOUNTER — Ambulatory Visit (INDEPENDENT_AMBULATORY_CARE_PROVIDER_SITE_OTHER): Payer: Medicare Other | Admitting: Cardiology

## 2017-04-17 VITALS — BP 140/72 | HR 115 | Ht 68.0 in | Wt 156.4 lb

## 2017-04-17 DIAGNOSIS — I208 Other forms of angina pectoris: Secondary | ICD-10-CM

## 2017-04-17 DIAGNOSIS — E785 Hyperlipidemia, unspecified: Secondary | ICD-10-CM | POA: Diagnosis not present

## 2017-04-17 DIAGNOSIS — E119 Type 2 diabetes mellitus without complications: Secondary | ICD-10-CM | POA: Diagnosis not present

## 2017-04-17 DIAGNOSIS — R079 Chest pain, unspecified: Secondary | ICD-10-CM

## 2017-04-17 DIAGNOSIS — I10 Essential (primary) hypertension: Secondary | ICD-10-CM | POA: Diagnosis not present

## 2017-04-17 NOTE — Patient Instructions (Addendum)
Medication Instructions: No changes  Procedures/Testing: Your physician has requested that you have Coronary CT. Cardiac computed tomography (CT) is a painless test that uses an x-ray machine to take clear, detailed pictures of your heart. For further information please visit [email or web address]. Someone will call you to make an appointment.    Follow-Up: Your physician recommends that you schedule a follow-up appointment in: after the Coronary CT with Dr. Ellyn Hack    If you need a refill on your cardiac medications before your next appointment, please call your pharmacy.

## 2017-04-17 NOTE — Progress Notes (Signed)
PCP: London Pepper, MD  Clinic Note: Chief Complaint  Patient presents with  . Chest Pain    pt states a heavy feeling   . Shortness of Breath    some    HPI: Christopher Welch is a 71 y.o. male who is being seen today for the evaluation of chest tightness at the request of London Pepper, MD. He has a history of hypertension, hyperlipidemia and diabetes mellitus, type II. Last A1c report is 6.1. He is a former smoker having quit in [place] notified Dr. Darien Ramus office on 04/07/2017 noticing some heaviness in his chest and some labored breathing at times. Also feels very tired. He did have normals daily did not matter whether he was sitting or standing. He noted about 3 pound weight gain around his midsection.  Recent Hospitalizations: None  Studies Personally Reviewed - (if available, images/films reviewed: From Epic Chart or Care Everywhere)  None  Interval History: Christopher Welch presents today for cardiology evaluation noting that he has had occasional episodes of heaviness and chest tightness/pressure with exertion. He says he feels as though his breathing is labored and he just feels tired more often than not. Interestingly though this happens regardless of with rest or exertion. It isn't always associated with exertion. He has not had prolonged resting pain and doesn't necessarily notice that it is worse with exertion. But we does notice that he has definitely decreased exercise tolerance of late. He says that he has not had the chest discomfort or labored breathing episodes at rest over the last week basically since he was referred. He still has his baseline shortness of breath with exertion, but not the labored breathing he was having. He denies having any rapid irregular heartbeat sensations although he is tachycardic today. When he gets the labored breathing sensation he does feel somewhat lightheaded but has not had any dizziness or syncope/near syncope.  He has not had PND  or orthopnea, but has noted increased urination frequency. He has not had any edema. No claudication symptoms. No TIA or amaurosis fugax symptoms.  Overall the symptoms he is been noting is been going on for about a month now and nothing that he does really makes it any better. Interestingly, it is not actually gotten any worse either.  Interestingly, he notes that for the last 3 months or so he has been trying to do for 5 days a week exercising doing 30 minutes with a treadmill on the 15 minutes on the stationary bike or other machines. He does note that he has been him to slow down with this little bit. He hasn't really been doing for the last 2 weeks mostly because the gym closed.  ROS: A comprehensive was performed. Pertinent positives noted above otherwise: Review of Systems  Constitutional: Negative for chills, fever and weight loss.  HENT: Negative for congestion and nosebleeds.   Eyes: Negative for blurred vision.  Respiratory: Negative for cough and wheezing.   Gastrointestinal: Negative for blood in stool, constipation, heartburn and melena.  Genitourinary: Positive for frequency. Negative for dysuria, hematuria and urgency.  Musculoskeletal: Negative for falls and joint pain.  Skin: Negative.   Neurological: Negative for weakness.  Psychiatric/Behavioral: Negative for depression and memory loss. The patient is not nervous/anxious and does not have insomnia.   All other systems reviewed and are negative.  I have reviewed and (if needed) personally updated the patient's problem list, medications, allergies, past medical and surgical history, social and family history.  Past Medical History:  Diagnosis Date  . Diabetes mellitus type I, controlled ([place])   . Dyslipidemia   . ED (erectile dysfunction)   . Essential hypertension   . Gilbert's disease   . Hepatitis B carrier [facility])     Past Surgical History:  Procedure Laterality Date  . FEMORAL HERNIA REPAIR      Current Meds   Medication Sig  . amLODipine (NORVASC) 2.5 MG tablet Take 2.5 mg by mouth daily.  Marland Kitchen aspirin 325 MG tablet Take 325 mg by mouth every evening.   Marland Kitchen atorvastatin (LIPITOR) 20 MG tablet Take 20 mg by mouth daily.  . Blood Glucose Monitoring Suppl (ACCU-CHEK NANO SMARTVIEW) W/DEVICE KIT 1 kit by Does not apply route 2 (two) times daily.  . Garcinia Cambogia-Chromium 500-200 MG-MCG TABS Take 1 tablet by mouth 3 (three) times daily.  Marland Kitchen glucose blood test strip Test 3 times a day.  . insulin regular (NOVOLIN R,HUMULIN R) 100 units/mL injection Inject 25-30 Units into the skin 3 (three) times daily before meals. 3 times a day (just before each meal) 25-30-25 units  . lisinopril (PRINIVIL,ZESTRIL) 20 MG tablet Take 20 mg by mouth daily.  . metFORMIN (GLUCOPHAGE) 1000 MG tablet Take 1,000 mg by mouth 2 (two) times daily with a meal.  . niacin (SLO-NIACIN) 500 MG tablet Take 500 mg by mouth at bedtime.  Marland Kitchen OVER THE COUNTER MEDICATION Take 1 tablet by mouth at bedtime. Z-quil    No Known Allergies  Social History   Social History  . Marital status: Divorced    Spouse name: N/A  . Number of children: N/A  . Years of education: N/A   Social History Main Topics  . Smoking status: Never Smoker  . Smokeless tobacco: Never Used  . Alcohol use 4.2 oz/week    7 Glasses of wine per week  . Drug use: No  . Sexual activity: Yes   Other Topics Concern  . None   Social History Narrative   Lives alone in a one story home - "partner" No children.    .  Works part time at [employer].   Education: masters in education   Quit smoking in 1984. Did have passive smoke exposure prior to that with his father.   Drains 5 glasses of wine per week. 2 cups of coffee daily.   Works out at [employer] regularly - 5 days per week.    family history includes Diabetes in his father.  Wt Readings from Last 3 Encounters:  04/17/17 156 lb 6.4 oz (70.9 kg)  11/16/15 157 lb (71.2 kg)  10/27/15 157 lb (71.2 kg)     PHYSICAL EXAM BP 140/72   Pulse (!) 115   Ht 5' 8"  (1.727 m)   Wt 156 lb 6.4 oz (70.9 kg)   BMI 23.78 kg/m  Physical Exam  Constitutional: He is oriented to person, place, and time. He appears well-developed and well-nourished. No distress.  Healthy-appearing.  HENT:  Head: Normocephalic and atraumatic.  Mouth/Throat: Oropharynx is clear and moist.  Eyes: Pupils are equal, round, and reactive to light. Conjunctivae and EOM are normal.  Neck: Normal range of motion. Neck supple. No JVD present.  Cardiovascular: Normal rate, regular rhythm, normal heart sounds and intact distal pulses.  Exam reveals no gallop and no friction rub.   No murmur heard. Pulmonary/Chest: Effort normal and breath sounds normal. No respiratory distress. He has no wheezes. He has no rales. He exhibits no tenderness.  Abdominal: Soft.  Bowel sounds are normal. He exhibits no distension. There is no tenderness. There is no rebound and no guarding.  Genitourinary:  Genitourinary Comments: Deferred  Musculoskeletal: Normal range of motion. He exhibits no edema, tenderness or deformity.  Lymphadenopathy:    He has no cervical adenopathy.  Neurological: He is alert and oriented to person, place, and time. No cranial nerve deficit.  Skin: Skin is warm and dry. No rash noted. No erythema.  Psychiatric: He has a normal mood and affect. His behavior is normal. Judgment and thought content normal.     Adult ECG Report  Rate: 115 ;  Rhythm: sinus tachycardia and Normal axis, intervals and durations;   Narrative Interpretation: Other than sinus tachycardia, normal   Other studies Reviewed: Additional studies/ records that were reviewed today include:  Recent Labs:    - Labs from June 2018: Sodium 138, potassium 4.3, chloride 103, bicarbonate 28, BUN 12, creatinine 1.23, glucose 180, calcium 9.6. Hemoglobin A1c 6.1. - Lipids February 2018: Total cholesterol 108, triglycerides 91, HDL 49, LDL 41.   ASSESSMENT /  PLAN: Problem List Items Addressed This Visit    Atypical angina ([place]) - Primary    As noted, symptoms are somewhat atypical as far as description goes, but atypical and the fact that is not necessarily exacerbated by exertion. In order to clarify think we do need to exclude ischemic heart disease which is best done with first anatomic evaluation followed by potential physiologic evaluation using coronary CTA.      Relevant Medications   amLODipine (NORVASC) 2.5 MG tablet   lisinopril (PRINIVIL,ZESTRIL) 20 MG tablet   Other Relevant Orders   CT CORONARY MORPH W/CTA COR W/SCORE W/CA W/CM &/OR WO/CM   CT CORONARY FRACTIONAL FLOW RESERVE DATA PREP   CT CORONARY FRACTIONAL FLOW RESERVE FLUID ANALYSIS   EKG 12-Lead (Completed)   Basic metabolic panel (Completed)   Chest pain with moderate risk for cardiac etiology    Certainly he has some favorable features for this to be angina based on the description of a heaviness associated with labored breathing and exercise intolerance. However what is unusual is that it occurs both at rest or exertion, sitting or standing. This is somewhat unusual and atypical. He does have diabetes, hypertension and hyperlipidemia.  For risk stratification and ischemic evaluation we discussed nuclear stress test versus coronary CTA starting with a cardiac calcium score. We decided the best option would be anatomical evaluation with coronary calcium score plus minus CTA which would then allow [place] to also do physiologic study with CT FFR if necessary. This will allow [place] to know if he has existing coronary disease, regardless of whether it is obstructive or not. It would help [place] adjusting his risk stratification.      Relevant Orders   Basic metabolic panel (Completed)   Diabetes mellitus type 2, noninsulin dependent ([place]) (Chronic)    Not currently on insulin. He is on metformin. A1c looks pretty well-controlled.      Relevant Medications   metFORMIN (GLUCOPHAGE) 1000  MG tablet   lisinopril (PRINIVIL,ZESTRIL) 20 MG tablet   Dyslipidemia (Chronic)   Essential hypertension (Chronic)    He is some borderline controlled on, recommendation of lisinopril and amlodipine. He is on low-dose amlodipine at 2.5 mg which will help as an antianginal. Pending his results of the stress test, I would potentially consider using a beta blocker which would help with his rate control given his tachycardia.      Relevant Medications  amLODipine (NORVASC) 2.5 MG tablet   lisinopril (PRINIVIL,ZESTRIL) 20 MG tablet   Hyperlipidemia with target low density lipoprotein (LDL) cholesterol less than 70 mg/dL (Chronic)    With the possible diagnosis of coronary artery disease, target LDL should be less than 70 and currently is based on his last labs. He is on moderate dose atorvastatin and niacin.      Relevant Medications   amLODipine (NORVASC) 2.5 MG tablet   lisinopril (PRINIVIL,ZESTRIL) 20 MG tablet      Current medicines are reviewed at length with the patient today. (+/- concerns) None The following changes have been made: None  Patient Instructions  Medication Instructions: No changes  Procedures/Testing: Your physician has requested that you have Coronary CT. Cardiac computed tomography (CT) is a painless test that uses an x-ray machine to take clear, detailed pictures of your heart. For further information please visit [email or web address]. Someone will call you to make an appointment.    Follow-Up: Your physician recommends that you schedule a follow-up appointment in: after the Coronary CT with Dr. Ellyn Hack    If you need a refill on your cardiac medications before your next appointment, please call your pharmacy.     Studies Ordered:   Orders Placed This Encounter  Procedures  . CT CORONARY MORPH W/CTA COR W/SCORE W/CA W/CM &/OR WO/CM  . CT CORONARY FRACTIONAL FLOW RESERVE DATA PREP  . CT CORONARY FRACTIONAL FLOW RESERVE FLUID ANALYSIS  . Basic  metabolic panel  . EKG 12-Lead      Glenetta Hew, M.D., M.S. Interventional Cardiologist   Pager # [phone] Phone # [phone] [address] [place], [place] [address]

## 2017-04-18 ENCOUNTER — Encounter: Payer: Self-pay | Admitting: Cardiology

## 2017-04-23 ENCOUNTER — Encounter: Payer: Self-pay | Admitting: Cardiology

## 2017-04-23 DIAGNOSIS — I208 Other forms of angina pectoris: Secondary | ICD-10-CM | POA: Insufficient documentation

## 2017-04-23 DIAGNOSIS — R079 Chest pain, unspecified: Secondary | ICD-10-CM | POA: Diagnosis not present

## 2017-04-23 DIAGNOSIS — I2089 Other forms of angina pectoris: Secondary | ICD-10-CM | POA: Insufficient documentation

## 2017-04-23 LAB — BASIC METABOLIC PANEL
BUN / CREAT RATIO: 11 (ref 10–24)
BUN: 15 mg/dL (ref 8–27)
CHLORIDE: 95 mmol/L — AB (ref 96–106)
CO2: 23 mmol/L (ref 20–29)
Calcium: 9.6 mg/dL (ref 8.6–10.2)
Creatinine, Ser: 1.4 mg/dL — ABNORMAL HIGH (ref 0.76–1.27)
GFR, EST AFRICAN AMERICAN: 59 mL/min/{1.73_m2} — AB (ref >59)
GFR, EST NON AFRICAN AMERICAN: 51 mL/min/{1.73_m2} — AB (ref >59)
Glucose: 291 mg/dL — ABNORMAL HIGH (ref 65–99)
Potassium: 5.4 mmol/L — ABNORMAL HIGH (ref 3.5–5.2)
Sodium: 136 mmol/L (ref 134–144)

## 2017-04-23 NOTE — Assessment & Plan Note (Signed)
He is some borderline controlled on, recommendation of lisinopril and amlodipine. He is on low-dose amlodipine at 2.5 mg which will help as an antianginal. Pending his results of the stress test, I would potentially consider using a beta blocker which would help with his rate control given his tachycardia.

## 2017-04-23 NOTE — Assessment & Plan Note (Signed)
Not currently on insulin. He is on metformin. A1c looks pretty well-controlled.

## 2017-04-23 NOTE — Assessment & Plan Note (Signed)
As noted, symptoms are somewhat atypical as far as description goes, but atypical and the fact that is not necessarily exacerbated by exertion. In order to clarify think we do need to exclude ischemic heart disease which is best done with first anatomic evaluation followed by potential physiologic evaluation using coronary CTA.

## 2017-04-23 NOTE — Assessment & Plan Note (Signed)
Certainly he has some favorable features for this to be angina based on the description of a heaviness associated with labored breathing and exercise intolerance. However what is unusual is that it occurs both at rest or exertion, sitting or standing. This is somewhat unusual and atypical. He does have diabetes, hypertension and hyperlipidemia.  For risk stratification and ischemic evaluation we discussed nuclear stress test versus coronary CTA starting with a cardiac calcium score. We decided the best option would be anatomical evaluation with coronary calcium score plus minus CTA which would then allow [place] to also do physiologic study with CT FFR if necessary. This will allow [place] to know if he has existing coronary disease, regardless of whether it is obstructive or not. It would help [place] adjusting his risk stratification.

## 2017-04-23 NOTE — Assessment & Plan Note (Signed)
With the possible diagnosis of coronary artery disease, target LDL should be less than 70 and currently is based on his last labs. He is on moderate dose atorvastatin and niacin.

## 2017-04-24 ENCOUNTER — Telehealth: Payer: Self-pay | Admitting: Cardiology

## 2017-04-24 DIAGNOSIS — Z79899 Other long term (current) drug therapy: Secondary | ICD-10-CM

## 2017-04-24 NOTE — Telephone Encounter (Signed)
Patient called in response to MyChart message received regarding lab results - copied below Notes recorded by Leonie Man, MD on 04/23/2017 at 8:02 PM EDT Just checked lab results prior to CT scan. Kidney function looks a little worse than one would expect, and potassium is high.  Recommendation: - Hold lisinopril 20mg  for 2 days then cut in half to (10 mg dose daily), increase amlodipine to 5 mg (2 tabs). Increase oral hydration.  Need recheck BMP pre-CT. Can check that AM - as long as renal Fxn stable or better, OK to do CT.  Christopher Hew, MD ____________ Patient voiced understanding of MD instructions. He will have repeat BMET on 8/7 - prior to 8/9 CT to ensure results are in prior to test to reassess renal function.   Med list updated + lab ordered (lab slip at front desk for patient to pick up)

## 2017-04-25 ENCOUNTER — Other Ambulatory Visit: Payer: Medicare Other

## 2017-05-06 DIAGNOSIS — Z79899 Other long term (current) drug therapy: Secondary | ICD-10-CM | POA: Diagnosis not present

## 2017-05-06 LAB — BASIC METABOLIC PANEL
BUN/Creatinine Ratio: 11 (ref 10–24)
BUN: 14 mg/dL (ref 8–27)
CALCIUM: 9.7 mg/dL (ref 8.6–10.2)
CHLORIDE: 103 mmol/L (ref 96–106)
CO2: 23 mmol/L (ref 20–29)
Creatinine, Ser: 1.26 mg/dL (ref 0.76–1.27)
GFR calc Af Amer: 67 mL/min/{1.73_m2} (ref >59)
GFR, EST NON AFRICAN AMERICAN: 58 mL/min/{1.73_m2} — AB (ref >59)
Glucose: 259 mg/dL — ABNORMAL HIGH (ref 65–99)
Potassium: 5.6 mmol/L — ABNORMAL HIGH (ref 3.5–5.2)
Sodium: 139 mmol/L (ref 134–144)

## 2017-05-07 ENCOUNTER — Encounter: Payer: Self-pay | Admitting: Cardiology

## 2017-05-07 DIAGNOSIS — Z79899 Other long term (current) drug therapy: Secondary | ICD-10-CM

## 2017-05-07 DIAGNOSIS — E875 Hyperkalemia: Secondary | ICD-10-CM

## 2017-05-08 ENCOUNTER — Ambulatory Visit (HOSPITAL_COMMUNITY)
Admission: RE | Admit: 2017-05-08 | Discharge: 2017-05-08 | Disposition: A | Discharge status: Home / Self Care | Payer: Medicare Other | Source: Ambulatory Visit | Attending: Cardiology | Admitting: Cardiology

## 2017-05-08 DIAGNOSIS — I208 Other forms of angina pectoris: Secondary | ICD-10-CM | POA: Diagnosis present

## 2017-05-08 DIAGNOSIS — I700 Atherosclerosis of aorta: Secondary | ICD-10-CM | POA: Insufficient documentation

## 2017-05-08 DIAGNOSIS — R079 Chest pain, unspecified: Secondary | ICD-10-CM | POA: Diagnosis not present

## 2017-05-08 DIAGNOSIS — I2510 Atherosclerotic heart disease of native coronary artery without angina pectoris: Secondary | ICD-10-CM | POA: Insufficient documentation

## 2017-05-08 MED ORDER — METOPROLOL TARTRATE 5 MG/5ML IV SOLN
INTRAVENOUS | Status: AC
  Administered 2017-05-08: 5 mg via INTRAVENOUS
  Filled 2017-05-08: qty 10

## 2017-05-08 MED ORDER — NITROGLYCERIN 0.4 MG SL SUBL
SUBLINGUAL_TABLET | SUBLINGUAL | Status: AC
  Administered 2017-05-08: 0.8 mg via SUBLINGUAL
  Filled 2017-05-08: qty 2

## 2017-05-08 MED ORDER — METOPROLOL TARTRATE 5 MG/5ML IV SOLN
5.0000 mg | INTRAVENOUS | Status: DC | PRN
  Administered 2017-05-08 (×2): 5 mg via INTRAVENOUS
  Filled 2017-05-08: qty 5

## 2017-05-08 MED ORDER — NITROGLYCERIN 0.4 MG SL SUBL
0.8000 mg | SUBLINGUAL_TABLET | Freq: Once | SUBLINGUAL | Status: AC
  Administered 2017-05-08: 0.8 mg via SUBLINGUAL
  Filled 2017-05-08: qty 25

## 2017-05-08 MED ORDER — IOPAMIDOL (ISOVUE-370) INJECTION 76%
100.0000 mL | Freq: Once | INTRAVENOUS | Status: AC | PRN
  Administered 2017-05-08: 80 mL via INTRAVENOUS

## 2017-05-08 MED ORDER — METOPROLOL TARTRATE 5 MG/5ML IV SOLN
INTRAVENOUS | Status: AC
  Filled 2017-05-08: qty 10

## 2017-05-08 NOTE — Telephone Encounter (Signed)
-----   Message from Leonie Man, MD sent at 05/07/2017  2:10 AM EDT ----- Labs are confusing -- glucose level is still high. Kidney function looks much better, but potassium level looks too high. - actually - taking higher insulin dose to bring glucose level down, will help potassium level.  Hold ACE-Inhibitor (lisinopril) x 2 days & continue to increase oral hydration.   Should be OK for CT Scan - but will need to recheck when you come in for CT.  Glenetta Hew, MD

## 2017-05-09 DIAGNOSIS — Z79899 Other long term (current) drug therapy: Secondary | ICD-10-CM | POA: Diagnosis not present

## 2017-05-09 DIAGNOSIS — E875 Hyperkalemia: Secondary | ICD-10-CM | POA: Diagnosis not present

## 2017-05-09 LAB — BASIC METABOLIC PANEL
BUN / CREAT RATIO: 11 (ref 10–24)
BUN: 15 mg/dL (ref 8–27)
CO2: 23 mmol/L (ref 20–29)
Calcium: 9.8 mg/dL (ref 8.6–10.2)
Chloride: 102 mmol/L (ref 96–106)
Creatinine, Ser: 1.34 mg/dL — ABNORMAL HIGH (ref 0.76–1.27)
GFR calc Af Amer: 62 mL/min/{1.73_m2} (ref >59)
GFR calc non Af Amer: 54 mL/min/{1.73_m2} — ABNORMAL LOW (ref >59)
GLUCOSE: 56 mg/dL — AB (ref 65–99)
Potassium: 4.7 mmol/L (ref 3.5–5.2)
Sodium: 141 mmol/L (ref 134–144)

## 2017-05-10 ENCOUNTER — Encounter (HOSPITAL_COMMUNITY): Payer: Self-pay

## 2017-05-13 ENCOUNTER — Telehealth: Payer: Self-pay | Admitting: *Deleted

## 2017-05-13 ENCOUNTER — Encounter: Payer: Self-pay | Admitting: Cardiology

## 2017-05-13 ENCOUNTER — Encounter: Payer: Self-pay | Admitting: *Deleted

## 2017-05-13 DIAGNOSIS — R931 Abnormal findings on diagnostic imaging of heart and coronary circulation: Secondary | ICD-10-CM

## 2017-05-13 DIAGNOSIS — Z01818 Encounter for other preprocedural examination: Secondary | ICD-10-CM

## 2017-05-13 DIAGNOSIS — R072 Precordial pain: Secondary | ICD-10-CM

## 2017-05-13 NOTE — Telephone Encounter (Signed)
-----   Message from Leonie Man, MD sent at 05/10/2017  1:07 AM EDT ----- We finally have the full coronary CTA result: In addition to having coronary artery calcification, there is also evidence of aortic atherosclerosis which is not unexpected in this setting. -> This is treated the same way we do coronary artery disease.  Essentially, the coronary calcium score was quite high at 731. -> This led to coronary CTA. Coronary CT Angiogram showed mild RCA plaque, but moderate proximal to mid LAD (left anterior descending artery) narrowing blockage of 50-69%. -> This was sent for CT FFR (which is an off-line evaluation of the physiologic significance of the blockage).  --> The read has not yet been completed, but the processing has been completed. Hopefully this will be read next week. I will be out of town, but will ask one of my colleagues to follow-up.  Glenetta Hew, MD  pls forward to pcp: London Pepper, MD  Dr. Loletha Grayer - can you be on the look out for this one please?  --> may need appt to discuss +/- Cath

## 2017-05-13 NOTE — Progress Notes (Signed)
Discussed CTA results with Christopher Welch by phone. Recommended diagnostic coronary angio/heart cath, followed if appropriate by same day PCI-stent of the mid LAD for symptomatic CAD (exertional angina). Non-urgent procedure, schedule first available with Dr. Ellyn Hack. This procedure has been fully reviewed with the patient and informed consent has been obtained.  Sanda Klein, MD, [facility] [phone] office [phone] pager 05/13/2017 1:21 PM

## 2017-05-13 NOTE — Telephone Encounter (Signed)
Spoke with pt, he wants to just get scheduled for the cath. Dr harding is in the cath lab Monday 05-19-17. Will schedule and call the patient back.

## 2017-05-13 NOTE — Telephone Encounter (Signed)
Cath is scheduled for Monday 05-19-17 @ 7:30 am. Instructions discussed with patient, he will come to the office tomorrow for pre-cath labs. Instruction letter left for patient pick up.

## 2017-05-14 DIAGNOSIS — R072 Precordial pain: Secondary | ICD-10-CM | POA: Diagnosis not present

## 2017-05-14 DIAGNOSIS — R931 Abnormal findings on diagnostic imaging of heart and coronary circulation: Secondary | ICD-10-CM | POA: Diagnosis not present

## 2017-05-14 DIAGNOSIS — Z01818 Encounter for other preprocedural examination: Secondary | ICD-10-CM | POA: Diagnosis not present

## 2017-05-15 LAB — CBC
HEMATOCRIT: 41.4 % (ref 37.5–51.0)
HEMOGLOBIN: 14.3 g/dL (ref 13.0–17.7)
MCH: 32.9 pg (ref 26.6–33.0)
MCHC: 34.5 g/dL (ref 31.5–35.7)
MCV: 95 fL (ref 79–97)
Platelets: 228 10*3/uL (ref 150–379)
RBC: 4.35 x10E6/uL (ref 4.14–5.80)
RDW: 13.9 % (ref 12.3–15.4)
WBC: 5.4 10*3/uL (ref 3.4–10.8)

## 2017-05-15 LAB — BASIC METABOLIC PANEL
BUN/Creatinine Ratio: 8 — ABNORMAL LOW (ref 10–24)
BUN: 11 mg/dL (ref 8–27)
CALCIUM: 10.2 mg/dL (ref 8.6–10.2)
CHLORIDE: 102 mmol/L (ref 96–106)
CO2: 24 mmol/L (ref 20–29)
Creatinine, Ser: 1.42 mg/dL — ABNORMAL HIGH (ref 0.76–1.27)
GFR, EST AFRICAN AMERICAN: 58 mL/min/{1.73_m2} — AB (ref >59)
GFR, EST NON AFRICAN AMERICAN: 50 mL/min/{1.73_m2} — AB (ref >59)
Glucose: 92 mg/dL (ref 65–99)
Potassium: 5.1 mmol/L (ref 3.5–5.2)
Sodium: 141 mmol/L (ref 134–144)

## 2017-05-15 LAB — PROTIME-INR
INR: 1.1 (ref 0.8–1.2)
Prothrombin Time: 11 s (ref 9.1–12.0)

## 2017-05-16 ENCOUNTER — Encounter: Payer: Self-pay | Admitting: Cardiology

## 2017-05-16 ENCOUNTER — Telehealth: Payer: Self-pay

## 2017-05-16 ENCOUNTER — Other Ambulatory Visit: Payer: Self-pay | Admitting: Cardiovascular Disease

## 2017-05-16 NOTE — Progress Notes (Signed)
Notified Christopher Welch to come in at 5:30 to get extra hydration, will plan to do cath around 9:00

## 2017-05-16 NOTE — Telephone Encounter (Signed)
Sent pre cath instruction via [place].

## 2017-05-19 ENCOUNTER — Encounter (HOSPITAL_COMMUNITY)
Admission: RE | Disposition: A | Discharge status: Home / Self Care | Payer: Self-pay | Source: Ambulatory Visit | Attending: Cardiology

## 2017-05-19 ENCOUNTER — Ambulatory Visit (HOSPITAL_COMMUNITY)
Admission: RE | Admit: 2017-05-19 | Discharge: 2017-05-19 | Disposition: A | Discharge status: Home / Self Care | Payer: Medicare Other | Source: Ambulatory Visit | Attending: Cardiology | Admitting: Cardiology

## 2017-05-19 ENCOUNTER — Encounter (HOSPITAL_COMMUNITY): Payer: Self-pay | Admitting: Cardiology

## 2017-05-19 DIAGNOSIS — I10 Essential (primary) hypertension: Secondary | ICD-10-CM | POA: Diagnosis present

## 2017-05-19 DIAGNOSIS — Z7982 Long term (current) use of aspirin: Secondary | ICD-10-CM | POA: Diagnosis not present

## 2017-05-19 DIAGNOSIS — B181 Chronic viral hepatitis B without delta-agent: Secondary | ICD-10-CM | POA: Insufficient documentation

## 2017-05-19 DIAGNOSIS — I25118 Atherosclerotic heart disease of native coronary artery with other forms of angina pectoris: Secondary | ICD-10-CM

## 2017-05-19 DIAGNOSIS — E785 Hyperlipidemia, unspecified: Secondary | ICD-10-CM | POA: Diagnosis present

## 2017-05-19 DIAGNOSIS — Z87891 Personal history of nicotine dependence: Secondary | ICD-10-CM | POA: Insufficient documentation

## 2017-05-19 DIAGNOSIS — I208 Other forms of angina pectoris: Secondary | ICD-10-CM | POA: Diagnosis present

## 2017-05-19 DIAGNOSIS — R931 Abnormal findings on diagnostic imaging of heart and coronary circulation: Secondary | ICD-10-CM | POA: Diagnosis present

## 2017-05-19 DIAGNOSIS — E119 Type 2 diabetes mellitus without complications: Secondary | ICD-10-CM | POA: Diagnosis not present

## 2017-05-19 DIAGNOSIS — Z794 Long term (current) use of insulin: Secondary | ICD-10-CM | POA: Diagnosis not present

## 2017-05-19 HISTORY — PX: LEFT HEART CATH AND CORONARY ANGIOGRAPHY: CATH118249

## 2017-05-19 LAB — BASIC METABOLIC PANEL
ANION GAP: 7 (ref 5–15)
BUN: 15 mg/dL (ref 6–20)
CO2: 26 mmol/L (ref 22–32)
Calcium: 9.7 mg/dL (ref 8.9–10.3)
Chloride: 105 mmol/L (ref 101–111)
Creatinine, Ser: 1.51 mg/dL — ABNORMAL HIGH (ref 0.61–1.24)
GFR calc Af Amer: 53 mL/min — ABNORMAL LOW (ref >60)
GFR calc non Af Amer: 45 mL/min — ABNORMAL LOW (ref >60)
GLUCOSE: 170 mg/dL — AB (ref 65–99)
POTASSIUM: 4.7 mmol/L (ref 3.5–5.1)
Sodium: 138 mmol/L (ref 135–145)

## 2017-05-19 LAB — GLUCOSE, CAPILLARY
GLUCOSE-CAPILLARY: 172 mg/dL — AB (ref 65–99)
Glucose-Capillary: 137 mg/dL — ABNORMAL HIGH (ref 65–99)

## 2017-05-19 SURGERY — LEFT HEART CATH AND CORONARY ANGIOGRAPHY
Anesthesia: LOCAL

## 2017-05-19 MED ORDER — VERAPAMIL HCL 2.5 MG/ML IV SOLN
INTRAVENOUS | Status: DC | PRN
  Administered 2017-05-19: 10 mL via INTRA_ARTERIAL

## 2017-05-19 MED ORDER — VERAPAMIL HCL 2.5 MG/ML IV SOLN
INTRAVENOUS | Status: AC
  Filled 2017-05-19: qty 2

## 2017-05-19 MED ORDER — FENTANYL CITRATE (PF) 100 MCG/2ML IJ SOLN
INTRAMUSCULAR | Status: AC
  Filled 2017-05-19: qty 2

## 2017-05-19 MED ORDER — HEPARIN (PORCINE) IN NACL 2-0.9 UNIT/ML-% IJ SOLN
INTRAMUSCULAR | Status: AC
Start: 2017-05-19 — End: ?
  Filled 2017-05-19: qty 1000

## 2017-05-19 MED ORDER — ASPIRIN 81 MG PO CHEW: 81.0000 mg | CHEWABLE_TABLET | ORAL | Status: DC

## 2017-05-19 MED ORDER — SODIUM CHLORIDE 0.9 % IV SOLN: 250.0000 mL | INTRAVENOUS | Status: DC | PRN

## 2017-05-19 MED ORDER — MIDAZOLAM HCL 2 MG/2ML IJ SOLN
INTRAMUSCULAR | Status: AC
  Filled 2017-05-19: qty 2

## 2017-05-19 MED ORDER — SODIUM CHLORIDE 0.9 % IV SOLN: INTRAVENOUS | Status: DC

## 2017-05-19 MED ORDER — SODIUM CHLORIDE 0.9% FLUSH: 3.0000 mL | INTRAVENOUS | Status: DC | PRN

## 2017-05-19 MED ORDER — IOPAMIDOL (ISOVUE-370) INJECTION 76%
INTRAVENOUS | Status: DC | PRN
  Administered 2017-05-19: 90 mL via INTRA_ARTERIAL

## 2017-05-19 MED ORDER — LIDOCAINE HCL (PF) 1 % IJ SOLN
INTRAMUSCULAR | Status: AC
  Filled 2017-05-19: qty 30

## 2017-05-19 MED ORDER — SODIUM CHLORIDE 0.9 % WEIGHT BASED INFUSION
3.0000 mL/kg/h | INTRAVENOUS | Status: DC
  Administered 2017-05-19: 3 mL/kg/h via INTRAVENOUS

## 2017-05-19 MED ORDER — HEPARIN (PORCINE) IN NACL 2-0.9 UNIT/ML-% IJ SOLN
INTRAMUSCULAR | Status: AC | PRN
  Administered 2017-05-19: 1000 mL

## 2017-05-19 MED ORDER — SODIUM CHLORIDE 0.9 % WEIGHT BASED INFUSION: 1.0000 mL/kg/h | INTRAVENOUS | Status: DC

## 2017-05-19 MED ORDER — ONDANSETRON HCL 4 MG/2ML IJ SOLN: 4.0000 mg | Freq: Four times a day (QID) | INTRAMUSCULAR | Status: DC | PRN

## 2017-05-19 MED ORDER — IOPAMIDOL (ISOVUE-370) INJECTION 76%
INTRAVENOUS | Status: AC
  Filled 2017-05-19: qty 100

## 2017-05-19 MED ORDER — MIDAZOLAM HCL 2 MG/2ML IJ SOLN
INTRAMUSCULAR | Status: DC | PRN
  Administered 2017-05-19: 1 mg via INTRAVENOUS

## 2017-05-19 MED ORDER — SODIUM CHLORIDE 0.9% FLUSH: 3.0000 mL | Freq: Two times a day (BID) | INTRAVENOUS | Status: DC

## 2017-05-19 MED ORDER — LIDOCAINE HCL (PF) 1 % IJ SOLN
INTRAMUSCULAR | Status: DC | PRN
  Administered 2017-05-19: 2 mL

## 2017-05-19 MED ORDER — ACETAMINOPHEN 325 MG PO TABS: 650.0000 mg | ORAL_TABLET | ORAL | Status: DC | PRN

## 2017-05-19 MED ORDER — FENTANYL CITRATE (PF) 100 MCG/2ML IJ SOLN
INTRAMUSCULAR | Status: DC | PRN
  Administered 2017-05-19: 25 ug via INTRAVENOUS

## 2017-05-19 MED ORDER — HEPARIN SODIUM (PORCINE) 1000 UNIT/ML IJ SOLN
INTRAMUSCULAR | Status: DC | PRN
  Administered 2017-05-19: 4000 [IU] via INTRAVENOUS

## 2017-05-19 MED ORDER — HEPARIN SODIUM (PORCINE) 1000 UNIT/ML IJ SOLN
INTRAMUSCULAR | Status: AC
  Filled 2017-05-19: qty 1

## 2017-05-19 SURGICAL SUPPLY — 14 items
CATH EXPO 5FR ANG PIGTAIL 145 (CATHETERS) ×2 IMPLANT
CATH EXPO 5FR FR4 (CATHETERS) ×2 IMPLANT
CATH INFINITI 5 FR 3DRC (CATHETERS) ×2 IMPLANT
CATH OPTITORQUE TIG 4.0 5F (CATHETERS) ×2 IMPLANT
COVER PRB 48X5XTLSCP FOLD TPE (BAG) ×1 IMPLANT
COVER PROBE 5X48 (BAG) ×2
DEVICE RAD COMP TR BAND LRG (VASCULAR PRODUCTS) ×2 IMPLANT
GLIDESHEATH SLEND A-KIT 6F 22G (SHEATH) ×2 IMPLANT
GUIDEWIRE INQWIRE 1.5J.035X260 (WIRE) ×1 IMPLANT
INQWIRE 1.5J .035X260CM (WIRE) ×2
KIT HEART LEFT (KITS) ×2 IMPLANT
PACK CARDIAC CATHETERIZATION (CUSTOM PROCEDURE TRAY) ×2 IMPLANT
TRANSDUCER W/STOPCOCK (MISCELLANEOUS) ×2 IMPLANT
TUBING CIL FLEX 10 FLL-RA (TUBING) ×2 IMPLANT

## 2017-05-19 NOTE — Interval H&P Note (Signed)
PCP: London Pepper, MD  Clinic Note:     Chief Complaint  Patient presents with  . Chest Pain    pt states a heavy feeling   . Shortness of Breath    some    HPI: Christopher Welch is a 71 y.o. male who is being seen today for the evaluation of chest tightness at the request of London Pepper, MD. He has a history of hypertension, hyperlipidemia and diabetes mellitus, type II. Last A1c report is 6.1. He is a former smoker having quit in [place] notified Dr. Darien Ramus office on 04/07/2017 noticing some heaviness in his chest and some labored breathing at times. Also feels very tired. He did have normals daily did not matter whether he was sitting or standing. He noted about 3 pound weight gain around his midsection.  Recent Hospitalizations: None  Studies Personally Reviewed - (if available, images/films reviewed: From Epic Chart or Care Everywhere)  None  Interval History: Christopher Welch presents today for cardiology evaluation noting that he has had occasional episodes of heaviness and chest tightness/pressure with exertion. He says he feels as though his breathing is labored and he just feels tired more often than not. Interestingly though this happens regardless of with rest or exertion. It isn't always associated with exertion. He has not had prolonged resting pain and doesn't necessarily notice that it is worse with exertion. But we does notice that he has definitely decreased exercise tolerance of late. He says that he has not had the chest discomfort or labored breathing episodes at rest over the last week basically since he was referred. He still has his baseline shortness of breath with exertion, but not the labored breathing he was having. He denies having any rapid irregular heartbeat sensations although he is tachycardic today. When he gets the labored breathing sensation he does feel somewhat lightheaded but has not had any dizziness or syncope/near syncope.  He has  not had PND or orthopnea, but has noted increased urination frequency. He has not had any edema. No claudication symptoms. No TIA or amaurosis fugax symptoms.  Overall the symptoms he is been noting is been going on for about a month now and nothing that he does really makes it any better. Interestingly, it is not actually gotten any worse either.  Interestingly, he notes that for the last 3 months or so he has been trying to do for 5 days a week exercising doing 30 minutes with a treadmill on the 15 minutes on the stationary bike or other machines. He does note that he has been him to slow down with this little bit. He hasn't really been doing for the last 2 weeks mostly because the gym closed.  ROS: A comprehensive was performed. Pertinent positives noted above otherwise: Review of Systems  Constitutional: Negative for chills, fever and weight loss.  HENT: Negative for congestion and nosebleeds.   Eyes: Negative for blurred vision.  Respiratory: Negative for cough and wheezing.   Gastrointestinal: Negative for blood in stool, constipation, heartburn and melena.  Genitourinary: Positive for frequency. Negative for dysuria, hematuria and urgency.  Musculoskeletal: Negative for falls and joint pain.  Skin: Negative.   Neurological: Negative for weakness.  Psychiatric/Behavioral: Negative for depression and memory loss. The patient is not nervous/anxious and does not have insomnia.   All other systems reviewed and are negative.  I have reviewed and (if needed) personally updated the patient's problem list, medications, allergies, past medical and surgical history, social  and family history.   Past Medical History:  Diagnosis Date  . Diabetes mellitus type I, controlled ([place])   . Dyslipidemia   . ED (erectile dysfunction)   . Essential hypertension   . Gilbert's disease   . Hepatitis B carrier [facility])          Past Surgical History:  Procedure Laterality Date  . FEMORAL  HERNIA REPAIR      Active Medications      Current Meds  Medication Sig  . amLODipine (NORVASC) 2.5 MG tablet Take 2.5 mg by mouth daily.  Marland Kitchen aspirin 325 MG tablet Take 325 mg by mouth every evening.   Marland Kitchen atorvastatin (LIPITOR) 20 MG tablet Take 20 mg by mouth daily.  . Blood Glucose Monitoring Suppl (ACCU-CHEK NANO SMARTVIEW) W/DEVICE KIT 1 kit by Does not apply route 2 (two) times daily.  . Garcinia Cambogia-Chromium 500-200 MG-MCG TABS Take 1 tablet by mouth 3 (three) times daily.  Marland Kitchen glucose blood test strip Test 3 times a day.  . insulin regular (NOVOLIN R,HUMULIN R) 100 units/mL injection Inject 25-30 Units into the skin 3 (three) times daily before meals. 3 times a day (just before each meal) 25-30-25 units  . lisinopril (PRINIVIL,ZESTRIL) 20 MG tablet Take 20 mg by mouth daily.  . metFORMIN (GLUCOPHAGE) 1000 MG tablet Take 1,000 mg by mouth 2 (two) times daily with a meal.  . niacin (SLO-NIACIN) 500 MG tablet Take 500 mg by mouth at bedtime.  Marland Kitchen OVER THE COUNTER MEDICATION Take 1 tablet by mouth at bedtime. Z-quil      No Known Allergies  Social History        Social History  . Marital status: Divorced    Spouse name: N/A  . Number of children: N/A  . Years of education: N/A        Social History Main Topics  . Smoking status: Never Smoker  . Smokeless tobacco: Never Used  . Alcohol use 4.2 oz/week    7 Glasses of wine per week  . Drug use: No  . Sexual activity: Yes       Other Topics Concern  . None      Social History Narrative   Lives alone in a one story home - "partner" No children.    .  Works part time at [employer].   Education: masters in education   Quit smoking in 1984. Did have passive smoke exposure prior to that with his father.   Drains 5 glasses of wine per week. 2 cups of coffee daily.   Works out at [employer] regularly - 5 days per week.    family history includes Diabetes in his father.     Wt Readings from Last  3 Encounters:  04/17/17 156 lb 6.4 oz (70.9 kg)  11/16/15 157 lb (71.2 kg)  10/27/15 157 lb (71.2 kg)    PHYSICAL EXAM BP 140/72   Pulse (!) 115   Ht _0  (1.727 m)   Wt 156 lb 6.4 oz (70.9 kg)   BMI 23.78 kg/m  Physical Exam  Constitutional: He is oriented to person, place, and time. He appears well-developed and well-nourished. No distress.  Healthy-appearing.  HENT:  Head: Normocephalic and atraumatic.  Mouth/Throat: Oropharynx is clear and moist.  Eyes: Pupils are equal, round, and reactive to light. Conjunctivae and EOM are normal.  Neck: Normal range of motion. Neck supple. No JVD present.  Cardiovascular: Normal rate, regular rhythm, normal heart sounds and intact distal pulses.  Exam reveals no gallop and no friction rub.   No murmur heard. Pulmonary/Chest: Effort normal and breath sounds normal. No respiratory distress. He has no wheezes. He has no rales. He exhibits no tenderness.  Abdominal: Soft. Bowel sounds are normal. He exhibits no distension. There is no tenderness. There is no rebound and no guarding.  Genitourinary:  Genitourinary Comments: Deferred  Musculoskeletal: Normal range of motion. He exhibits no edema, tenderness or deformity.  Lymphadenopathy:    He has no cervical adenopathy.  Neurological: He is alert and oriented to person, place, and time. No cranial nerve deficit.  Skin: Skin is warm and dry. No rash noted. No erythema.  Psychiatric: He has a normal mood and affect. His behavior is normal. Judgment and thought content normal.     Adult ECG Report  Rate: 115 ;  Rhythm: sinus tachycardia and Normal axis, intervals and durations;   Narrative Interpretation: Other than sinus tachycardia, normal   Other studies Reviewed: Additional studies/ records that were reviewed today include:  Recent Labs:    - Labs from June 2018: Sodium 138, potassium 4.3, chloride 103, bicarbonate 28, BUN 12, creatinine 1.23, glucose 180, calcium 9.6. Hemoglobin  A1c 6.1. - Lipids February 2018: Total cholesterol 108, triglycerides 91, HDL 49, LDL 41.   ASSESSMENT / PLAN:    Problem List Items Addressed This Visit    Atypical angina ([place]) - Primary    As noted, symptoms are somewhat atypical as far as description goes, but atypical and the fact that is not necessarily exacerbated by exertion. In order to clarify think we do need to exclude ischemic heart disease which is best done with first anatomic evaluation followed by potential physiologic evaluation using coronary CTA.      Relevant Medications   amLODipine (NORVASC) 2.5 MG tablet   lisinopril (PRINIVIL,ZESTRIL) 20 MG tablet   Other Relevant Orders   CT CORONARY MORPH W/CTA COR W/SCORE W/CA W/CM &/OR WO/CM   CT CORONARY FRACTIONAL FLOW RESERVE DATA PREP   CT CORONARY FRACTIONAL FLOW RESERVE FLUID ANALYSIS   EKG 12-Lead (Completed)   Basic metabolic panel (Completed)   Chest pain with moderate risk for cardiac etiology    Certainly he has some favorable features for this to be angina based on the description of a heaviness associated with labored breathing and exercise intolerance. However what is unusual is that it occurs both at rest or exertion, sitting or standing. This is somewhat unusual and atypical. He does have diabetes, hypertension and hyperlipidemia.  For risk stratification and ischemic evaluation we discussed nuclear stress test versus coronary CTA starting with a cardiac calcium score. We decided the best option would be anatomical evaluation with coronary calcium score plus minus CTA which would then allow [place] to also do physiologic study with CT FFR if necessary. This will allow [place] to know if he has existing coronary disease, regardless of whether it is obstructive or not. It would help [place] adjusting his risk stratification.      Relevant Orders   Basic metabolic panel (Completed)   Diabetes mellitus type 2, noninsulin dependent ([place]) (Chronic)    Not  currently on insulin. He is on metformin. A1c looks pretty well-controlled.      Relevant Medications   metFORMIN (GLUCOPHAGE) 1000 MG tablet   lisinopril (PRINIVIL,ZESTRIL) 20 MG tablet   Dyslipidemia (Chronic)   Essential hypertension (Chronic)    He is some borderline controlled on, recommendation of lisinopril and amlodipine. He is on low-dose amlodipine at 2.5  mg which will help as an antianginal. Pending his results of the stress test, I would potentially consider using a beta blocker which would help with his rate control given his tachycardia.      Relevant Medications   amLODipine (NORVASC) 2.5 MG tablet   lisinopril (PRINIVIL,ZESTRIL) 20 MG tablet   Hyperlipidemia with target low density lipoprotein (LDL) cholesterol less than 70 mg/dL (Chronic)    With the possible diagnosis of coronary artery disease, target LDL should be less than 70 and currently is based on his last labs. He is on moderate dose atorvastatin and niacin.      Relevant Medications   amLODipine (NORVASC) 2.5 MG tablet   lisinopril (PRINIVIL,ZESTRIL) 20 MG tablet      Current medicines are reviewed at length with the patient today. (+/- concerns) None The following changes have been made: None  Patient Instructions  Medication Instructions: No changes  Procedures/Testing: Your physician has requested that you have Coronary CT. Cardiac computed tomography (CT) is a painless test that uses an x-ray machine to take clear, detailed pictures of your heart. For further information please visit [email or web address]. Someone will call you to make an appointment.    Follow-Up: Your physician recommends that you schedule a follow-up appointment in: after the Coronary CT with Dr. Ellyn Hack    If you need a refill on your cardiac medications before your next appointment, please call your pharmacy.     Studies Ordered:      Orders Placed This Encounter  Procedures  . CT  CORONARY MORPH W/CTA COR W/SCORE W/CA W/CM &/OR WO/CM  . CT CORONARY FRACTIONAL FLOW RESERVE DATA PREP  . CT CORONARY FRACTIONAL FLOW RESERVE FLUID ANALYSIS  . Basic metabolic panel  . EKG 12-Lead      Glenetta Hew, M.D., M.S. Interventional Cardiologist   Pager # [phone] Phone # [phone] [address] [place], [place] [address]    History and Physical Interval Note:  05/19/2017 8:58 AM  Christopher Welch  has presented today for surgery, with the diagnosis of abnormal cardiac CT, cp  The various methods of treatment have been discussed with the patient and family. After consideration of risks, benefits and other options for treatment, the patient has consented to  Procedure(s): LEFT HEART CATH AND CORONARY ANGIOGRAPHY (N/A) with possible Percutaneous Coronary Intervention as a surgical intervention .  The patient's history has been reviewed, patient examined, no change in status, stable for surgery.  I have reviewed the patient's chart and labs.  Questions were answered to the patient's satisfaction.    - Recheck of labs indicated Cr 1.51 (has been ranging from 1.3-1.5 over last few months) -- will hydrate post cath.  Cath Lab Visit (complete for each Cath Lab visit)  Clinical Evaluation Leading to the Procedure:   ACS: No.  Non-ACS:    Anginal Classification: CCS I  Anti-ischemic medical therapy: Minimal Therapy (1 class of medications)  Non-Invasive Test Results: High-risk stress test findings: cardiac mortality >3%/year - Coronary CTA with FFR 0.8 in dLAD  Prior CABG: No previous CABG   Glenetta Hew

## 2017-05-19 NOTE — Research (Signed)
Optimize Study Informed Consent   Subject Name: Christopher Welch  Subject met inclusion and exclusion criteria.  The informed consent form, study requirements and expectations were reviewed with the subject and questions and concerns were addressed prior to the signing of the consent form.  The subject verbalized understanding of the trail requirements.  The subject agreed to participate in the Optimize trial and signed the informed consent.  The informed consent was obtained prior to performance of any protocol-specific procedures for the subject.  A copy of the signed informed consent was given to the subject and a copy was placed in the subject's medical record.  Sandie Ano 05/19/2017, 8:23 AM

## 2017-05-19 NOTE — Discharge Instructions (Signed)

## 2017-05-19 NOTE — H&P (View-Only) (Signed)
Discussed CTA results with Christopher Welch by phone. Recommended diagnostic coronary angio/heart cath, followed if appropriate by same day PCI-stent of the mid LAD for symptomatic CAD (exertional angina). Non-urgent procedure, schedule first available with Dr. Ellyn Hack. This procedure has been fully reviewed with the patient and informed consent has been obtained.  Sanda Klein, MD, [facility] [phone] office [phone] pager 05/13/2017 1:21 PM

## 2017-06-03 DIAGNOSIS — Z9641 Presence of insulin pump (external) (internal): Secondary | ICD-10-CM | POA: Diagnosis not present

## 2017-06-03 DIAGNOSIS — E109 Type 1 diabetes mellitus without complications: Secondary | ICD-10-CM | POA: Diagnosis not present

## 2017-06-10 ENCOUNTER — Ambulatory Visit (INDEPENDENT_AMBULATORY_CARE_PROVIDER_SITE_OTHER): Payer: Medicare Other | Admitting: Cardiology

## 2017-06-10 ENCOUNTER — Encounter: Payer: Self-pay | Admitting: Cardiology

## 2017-06-10 VITALS — BP 130/76 | HR 83 | Ht 68.0 in | Wt 160.2 lb

## 2017-06-10 DIAGNOSIS — R079 Chest pain, unspecified: Secondary | ICD-10-CM | POA: Diagnosis not present

## 2017-06-10 DIAGNOSIS — E785 Hyperlipidemia, unspecified: Secondary | ICD-10-CM

## 2017-06-10 DIAGNOSIS — I10 Essential (primary) hypertension: Secondary | ICD-10-CM

## 2017-06-10 DIAGNOSIS — I209 Angina pectoris, unspecified: Secondary | ICD-10-CM | POA: Diagnosis not present

## 2017-06-10 DIAGNOSIS — I208 Other forms of angina pectoris: Secondary | ICD-10-CM | POA: Diagnosis not present

## 2017-06-10 NOTE — Progress Notes (Signed)
PCP: London Pepper, MD  Clinic Note: Chief Complaint  Patient presents with  . Hospitalization Follow-up    Post-CATH for Abn Cor CTA    HPI: Christopher Welch is a 71 y.o. male with a PMH below who presents today for Follow-up after cardiac catheterization. He has a history of hypertension, hyperlipidemia and diabetes mellitus, type II. Last A1c report is 6.1. He is a former smoker having quit in 1980.  He was referred for cardiac catheterization due to abnormal CT which suggested possible positive for the distal LAD.  CHANEL MCKESSON was last seen on 04/17/2017 as part of his pre-cath discussion resulting from his coronary CTA.  Recent Hospitalizations: for cardiac catheterization on August 20  Studies Personally Reviewed - (if available, images/films reviewed: From Epic Chart or Care Everywhere)  Cardiac catheterization 05/19/2017: Angiographic moderate disease in the LAD.  Ost RCA lesion, 30 %stenosed.  Lat Ramus lesion, 99 %stenosed - small (<2 mm) branch fills via right-left collaterals  1st Diag lesion, 30 %stenosed.  Prox LAD lesion, 30 %stenosed. Dist LAD lesion, 50 %stenosed.- No obvious culprit lesion that would explain an FFR of less than 0.8  The left ventricular systolic function is normal.  LV end diastolic pressure is normal.  Diagnostic Diagram    Interval History: Huey presents today overall doing fairly well. He has not had any further chest pain symptoms since before the cath. He has gone back to working out at [employer] doing treadmill and other machines. It is about 30 minutes on the treadmill and then about 15-20 minutes on 4 different other machines as well as the elliptical. He really denies any resting or exertional chest tightness or pressure. No significant dyspnea. None of the symptoms that led to his initial evaluation. He had one time when he felt like his heart was skipping some beats and was a little better rhythm but it only lasted about a minute.  Other than that is doing fairly well. He is very happy to announce his A1c is down to 5.8. He denies any heart failure symptoms of PND, orthopnea or edema.   No palpitations, lightheadedness, dizziness, weakness or syncope/near syncope. No TIA/amaurosis fugax symptoms.  No claudication.  ROS: A comprehensive was performed. Review of Systems  Constitutional: Negative for malaise/fatigue.  HENT: Negative for congestion and nosebleeds.   Respiratory: Negative for shortness of breath and wheezing.   Cardiovascular: Negative.   Gastrointestinal: Negative for abdominal pain, blood in stool, constipation and melena.  Genitourinary: Positive for frequency. Negative for hematuria and urgency.  Musculoskeletal: Negative for myalgias.  Neurological: Negative for dizziness and headaches.  Psychiatric/Behavioral: Negative.   All other systems reviewed and are negative.  I have reviewed and (if needed) personally updated the patient's problem list, medications, allergies, past medical and surgical history, social and family history.   Past Medical History:  Diagnosis Date  . Diabetes mellitus type I, controlled ([place])   . Dyslipidemia   . ED (erectile dysfunction)   . Essential hypertension   . Gilbert's disease   . Hepatitis B carrier [facility])     Past Surgical History:  Procedure Laterality Date  . FEMORAL HERNIA REPAIR    . LEFT HEART CATH AND CORONARY ANGIOGRAPHY N/A 05/19/2017   Procedure: LEFT HEART CATH AND CORONARY ANGIOGRAPHY;  Surgeon: Leonie Man, MD;  Location: [facility];  Service: Cardiovascular;  Laterality: N/A;    Current Meds  Medication Sig  . amLODipine (NORVASC) 2.5 MG tablet Take  5 mg by mouth daily.   Marland Kitchen aspirin 325 MG tablet Take 325 mg by mouth at bedtime.   Marland Kitchen atorvastatin (LIPITOR) 10 MG tablet Take 10 mg by mouth daily.  . Blood Glucose Monitoring Suppl (ACCU-CHEK NANO SMARTVIEW) W/DEVICE KIT 1 kit by Does not apply route 2 (two) times daily. (Patient  taking differently: 1 kit by Does not apply route See admin instructions. Test blood sugars 12x's daily)  . DiphenhydrAMINE HCl (ZZZQUIL) 50 MG/30ML LIQD Take 60 mLs by mouth at bedtime.  Marland Kitchen glucose blood test strip Test 3 times a day. (Patient taking differently: 1 each by Other route See admin instructions. Test 12 times a day.)  . lisinopril (PRINIVIL,ZESTRIL) 10 MG tablet Take 10 mg by mouth daily.  . metFORMIN (GLUCOPHAGE) 1000 MG tablet Take 1,000 mg by mouth 2 (two) times daily with a meal. Before lunch & before dinner  . Niacin CR (SLO-NIACIN) 750 MG TBCR Take 1,500 mg by mouth at bedtime.  Marland Kitchen NOVOLOG 100 UNIT/ML injection Inject 5-10 Units into the skin 3 (three) times daily with meals. 10 units with breakfast, 5 units with lunch, and 5 units with dinner    No Known Allergies  Social History   Social History  . Marital status: Divorced    Spouse name: N/A  . Number of children: N/A  . Years of education: N/A   Social History Main Topics  . Smoking status: Never Smoker  . Smokeless tobacco: Never Used  . Alcohol use 4.2 oz/week    7 Glasses of wine per week  . Drug use: No  . Sexual activity: Yes   Other Topics Concern  . None   Social History Narrative   Lives alone in a one story home - "partner" No children.    .  Works part time at [employer].   Education: masters in education   Quit smoking in 1984. Did have passive smoke exposure prior to that with his father.   Drains 5 glasses of wine per week. 2 cups of coffee daily.   Works out at [employer] regularly - 5 days per week.    family history includes Diabetes in his father.  Wt Readings from Last 3 Encounters:  06/10/17 160 lb 3.2 oz (72.7 kg)  05/19/17 157 lb (71.2 kg)  04/17/17 156 lb 6.4 oz (70.9 kg)    PHYSICAL EXAM BP 130/76   Pulse 83   Ht 5' 8" (1.727 m)   Wt 160 lb 3.2 oz (72.7 kg)   SpO2 100%   BMI 24.36 kg/m  Physical Exam  Constitutional: He is oriented to person, place, and time. He  appears well-developed and well-nourished. No distress.  Healthy-appearing. Well groomed  HENT:  Head: Normocephalic and atraumatic.  Neck: No JVD present.  Cardiovascular: Normal rate, regular rhythm, normal heart sounds and intact distal pulses.   Occasional extrasystoles are present. PMI is not displaced.  Exam reveals no gallop and no friction rub.   No murmur heard. Pulmonary/Chest: Effort normal and breath sounds normal. No respiratory distress. He has no wheezes. He has no rales.  Abdominal: Soft. Bowel sounds are normal. He exhibits no distension. There is no tenderness.  Neurological: He is alert and oriented to person, place, and time.  Skin: Skin is warm and dry. No rash noted. No erythema.  Psychiatric: He has a normal mood and affect. His behavior is normal. Judgment and thought content normal.  Nursing note and vitals reviewed.    Adult ECG  Report n/a  Other studies Reviewed: Additional studies/ records that were reviewed today include:  Recent Labs:  Lab Results  Component Value Date   CREATININE 1.51 (H) 05/19/2017   BUN 15 05/19/2017   NA 138 05/19/2017   K 4.7 05/19/2017   CL 105 05/19/2017   CO2 26 05/19/2017    -  Lipids February 2018: Total cholesterol 108, triglycerides 91, HDL 49, LDL 41.   ASSESSMENT / PLAN: Problem List Items Addressed This Visit    Atypical angina ([place])    Interestingly not really having any further episodes of chest pain which would be medically that maybe that wasn't angina in the first place. He seems to be doing better with the amlodipine which indicates that there may have been a spasm component. He is still on aspirin which she can probably reduce to lower dose along with statin. He is not on a beta blocker which we can consider the future. - he was reluctant to try beta blocker because of concerns for fat.      Chest pain with moderate risk for cardiac etiology - Primary    Caren Griffins for suggesting LAD disease, however  angiographically, I could not find a true culprit lesion and therefore I did not perform PCI.the only lesion that would potentially symptom with causing would be the small branch the ramus which is filled via collaterals and too small for PCI. Continue aspirin and statin and at this point amlodipine as opposed to beta blocker.      Essential hypertension (Chronic)    Well-controlled on current meds. If his heart rate continues as they are, we may need to consider switching him amlodipine to diltiazem or beta blocker.for now no changes however.      Hyperlipidemia with target low density lipoprotein (LDL) cholesterol less than 70 mg/dL (Chronic)    Continue atorvastatin and niacin. I don't have recent labs available, will defer to PCP.         Current medicines are reviewed at length with the patient today. (+/- concerns) n/a The following changes have been made: n/a  Patient Instructions  NO CHANGES WITH MEDICATION   Your physician wants you to follow-up in [place].You will receive a reminder letter in the mail two months in advance. If you don't receive a letter, please call our office to schedule the follow-up appointment.   If you need a refill on your cardiac medications before your next appointment, please call your pharmacy.    Studies Ordered:   No orders of the defined types were placed in this encounter.     Glenetta Hew, M.D., M.S. Interventional Cardiologist   Pager # [phone] Phone # [phone] [address] [place], [place] [address]

## 2017-06-10 NOTE — Patient Instructions (Addendum)
NO CHANGES WITH MEDICATION   Your physician wants you to follow-up in [place].You will receive a reminder letter in the mail two months in advance. If you don't receive a letter, please call our office to schedule the follow-up appointment.   If you need a refill on your cardiac medications before your next appointment, please call your pharmacy.

## 2017-06-12 ENCOUNTER — Encounter: Payer: Self-pay | Admitting: Cardiology

## 2017-06-12 NOTE — Assessment & Plan Note (Signed)
Well-controlled on current meds. If his heart rate continues as they are, we may need to consider switching him amlodipine to diltiazem or beta blocker.for now no changes however.

## 2017-06-12 NOTE — Assessment & Plan Note (Signed)
Continue atorvastatin and niacin. I don't have recent labs available, will defer to PCP.

## 2017-06-12 NOTE — Assessment & Plan Note (Signed)
Christopher Welch for suggesting LAD disease, however angiographically, I could not find a true culprit lesion and therefore I did not perform PCI.the only lesion that would potentially symptom with causing would be the small branch the ramus which is filled via collaterals and too small for PCI. Continue aspirin and statin and at this point amlodipine as opposed to beta blocker.

## 2017-06-12 NOTE — Assessment & Plan Note (Signed)
Interestingly not really having any further episodes of chest pain which would be medically that maybe that wasn't angina in the first place. He seems to be doing better with the amlodipine which indicates that there may have been a spasm component. He is still on aspirin which she can probably reduce to lower dose along with statin. He is not on a beta blocker which we can consider the future. - he was reluctant to try beta blocker because of concerns for fat.

## 2017-07-01 DIAGNOSIS — E875 Hyperkalemia: Secondary | ICD-10-CM | POA: Diagnosis not present

## 2017-08-12 DIAGNOSIS — K635 Polyp of colon: Secondary | ICD-10-CM | POA: Diagnosis not present

## 2017-08-12 DIAGNOSIS — Z1211 Encounter for screening for malignant neoplasm of colon: Secondary | ICD-10-CM | POA: Diagnosis not present

## 2017-08-12 DIAGNOSIS — K640 First degree hemorrhoids: Secondary | ICD-10-CM | POA: Diagnosis not present

## 2017-08-14 DIAGNOSIS — Z1211 Encounter for screening for malignant neoplasm of colon: Secondary | ICD-10-CM | POA: Diagnosis not present

## 2017-08-14 DIAGNOSIS — K635 Polyp of colon: Secondary | ICD-10-CM | POA: Diagnosis not present

## 2017-10-07 DIAGNOSIS — Z794 Long term (current) use of insulin: Secondary | ICD-10-CM | POA: Diagnosis not present

## 2017-10-07 DIAGNOSIS — Z7982 Long term (current) use of aspirin: Secondary | ICD-10-CM | POA: Diagnosis not present

## 2017-10-07 DIAGNOSIS — E11649 Type 2 diabetes mellitus with hypoglycemia without coma: Secondary | ICD-10-CM | POA: Diagnosis not present

## 2017-10-07 DIAGNOSIS — E109 Type 1 diabetes mellitus without complications: Secondary | ICD-10-CM | POA: Diagnosis not present

## 2017-10-07 DIAGNOSIS — Z9641 Presence of insulin pump (external) (internal): Secondary | ICD-10-CM | POA: Diagnosis not present

## 2017-10-16 DIAGNOSIS — H2513 Age-related nuclear cataract, bilateral: Secondary | ICD-10-CM | POA: Diagnosis not present

## 2017-10-16 DIAGNOSIS — E119 Type 2 diabetes mellitus without complications: Secondary | ICD-10-CM | POA: Diagnosis not present

## 2017-10-16 DIAGNOSIS — H524 Presbyopia: Secondary | ICD-10-CM | POA: Diagnosis not present

## 2017-11-04 DIAGNOSIS — Z0000 Encounter for general adult medical examination without abnormal findings: Secondary | ICD-10-CM | POA: Diagnosis not present

## 2017-11-04 DIAGNOSIS — Z794 Long term (current) use of insulin: Secondary | ICD-10-CM | POA: Diagnosis not present

## 2017-11-04 DIAGNOSIS — E785 Hyperlipidemia, unspecified: Secondary | ICD-10-CM | POA: Diagnosis not present

## 2017-11-04 DIAGNOSIS — Z7984 Long term (current) use of oral hypoglycemic drugs: Secondary | ICD-10-CM | POA: Diagnosis not present

## 2017-11-04 DIAGNOSIS — I10 Essential (primary) hypertension: Secondary | ICD-10-CM | POA: Diagnosis not present

## 2017-11-04 DIAGNOSIS — E119 Type 2 diabetes mellitus without complications: Secondary | ICD-10-CM | POA: Diagnosis not present

## 2017-11-25 DIAGNOSIS — L259 Unspecified contact dermatitis, unspecified cause: Secondary | ICD-10-CM | POA: Diagnosis not present

## 2018-01-06 DIAGNOSIS — E1022 Type 1 diabetes mellitus with diabetic chronic kidney disease: Secondary | ICD-10-CM | POA: Diagnosis not present

## 2018-01-06 DIAGNOSIS — I10 Essential (primary) hypertension: Secondary | ICD-10-CM | POA: Diagnosis not present

## 2018-01-06 DIAGNOSIS — E109 Type 1 diabetes mellitus without complications: Secondary | ICD-10-CM | POA: Diagnosis not present

## 2018-01-06 DIAGNOSIS — Z7982 Long term (current) use of aspirin: Secondary | ICD-10-CM | POA: Diagnosis not present

## 2018-01-06 DIAGNOSIS — I2510 Atherosclerotic heart disease of native coronary artery without angina pectoris: Secondary | ICD-10-CM | POA: Diagnosis not present

## 2018-01-06 DIAGNOSIS — E10649 Type 1 diabetes mellitus with hypoglycemia without coma: Secondary | ICD-10-CM | POA: Diagnosis not present

## 2018-05-12 DIAGNOSIS — E1022 Type 1 diabetes mellitus with diabetic chronic kidney disease: Secondary | ICD-10-CM | POA: Diagnosis not present

## 2018-05-14 DIAGNOSIS — M79672 Pain in left foot: Secondary | ICD-10-CM | POA: Diagnosis not present

## 2018-05-14 DIAGNOSIS — M722 Plantar fascial fibromatosis: Secondary | ICD-10-CM | POA: Diagnosis not present

## 2018-05-14 DIAGNOSIS — M7732 Calcaneal spur, left foot: Secondary | ICD-10-CM | POA: Diagnosis not present

## 2018-05-14 DIAGNOSIS — M12272 Villonodular synovitis (pigmented), left ankle and foot: Secondary | ICD-10-CM | POA: Diagnosis not present

## 2018-05-14 DIAGNOSIS — M71572 Other bursitis, not elsewhere classified, left ankle and foot: Secondary | ICD-10-CM | POA: Diagnosis not present

## 2018-05-28 DIAGNOSIS — M722 Plantar fascial fibromatosis: Secondary | ICD-10-CM | POA: Diagnosis not present

## 2018-06-25 DIAGNOSIS — I10 Essential (primary) hypertension: Secondary | ICD-10-CM | POA: Diagnosis not present

## 2018-06-25 DIAGNOSIS — E7800 Pure hypercholesterolemia, unspecified: Secondary | ICD-10-CM | POA: Diagnosis not present

## 2018-06-25 DIAGNOSIS — E119 Type 2 diabetes mellitus without complications: Secondary | ICD-10-CM | POA: Diagnosis not present

## 2018-06-30 DIAGNOSIS — E119 Type 2 diabetes mellitus without complications: Secondary | ICD-10-CM | POA: Diagnosis not present

## 2018-06-30 DIAGNOSIS — E7800 Pure hypercholesterolemia, unspecified: Secondary | ICD-10-CM | POA: Diagnosis not present

## 2018-06-30 DIAGNOSIS — I10 Essential (primary) hypertension: Secondary | ICD-10-CM | POA: Diagnosis not present

## 2018-10-06 DIAGNOSIS — E7800 Pure hypercholesterolemia, unspecified: Secondary | ICD-10-CM | POA: Diagnosis not present

## 2018-10-06 DIAGNOSIS — E119 Type 2 diabetes mellitus without complications: Secondary | ICD-10-CM | POA: Diagnosis not present

## 2018-10-22 DIAGNOSIS — H2513 Age-related nuclear cataract, bilateral: Secondary | ICD-10-CM | POA: Diagnosis not present

## 2018-10-22 DIAGNOSIS — E119 Type 2 diabetes mellitus without complications: Secondary | ICD-10-CM | POA: Diagnosis not present

## 2018-11-03 DIAGNOSIS — I10 Essential (primary) hypertension: Secondary | ICD-10-CM | POA: Diagnosis not present

## 2018-11-03 DIAGNOSIS — E119 Type 2 diabetes mellitus without complications: Secondary | ICD-10-CM | POA: Diagnosis not present

## 2018-11-03 DIAGNOSIS — E7800 Pure hypercholesterolemia, unspecified: Secondary | ICD-10-CM | POA: Diagnosis not present

## 2019-01-09 DIAGNOSIS — R109 Unspecified abdominal pain: Secondary | ICD-10-CM | POA: Diagnosis not present

## 2019-01-12 ENCOUNTER — Other Ambulatory Visit: Payer: Self-pay | Admitting: Family Medicine

## 2019-01-12 DIAGNOSIS — R1013 Epigastric pain: Secondary | ICD-10-CM | POA: Diagnosis not present

## 2019-01-13 DIAGNOSIS — R7989 Other specified abnormal findings of blood chemistry: Secondary | ICD-10-CM | POA: Diagnosis not present

## 2019-01-13 DIAGNOSIS — R945 Abnormal results of liver function studies: Secondary | ICD-10-CM | POA: Diagnosis not present

## 2019-01-14 ENCOUNTER — Other Ambulatory Visit: Payer: Self-pay

## 2019-01-14 ENCOUNTER — Ambulatory Visit
Admission: RE | Admit: 2019-01-14 | Discharge: 2019-01-14 | Disposition: A | Discharge status: Home / Self Care | Payer: Medicare Other | Source: Ambulatory Visit | Attending: Family Medicine | Admitting: Family Medicine

## 2019-01-14 DIAGNOSIS — R1013 Epigastric pain: Secondary | ICD-10-CM

## 2019-01-18 ENCOUNTER — Other Ambulatory Visit: Payer: Self-pay | Admitting: Gastroenterology

## 2019-01-18 DIAGNOSIS — R932 Abnormal findings on diagnostic imaging of liver and biliary tract: Secondary | ICD-10-CM | POA: Diagnosis not present

## 2019-01-18 DIAGNOSIS — E119 Type 2 diabetes mellitus without complications: Secondary | ICD-10-CM | POA: Diagnosis not present

## 2019-01-18 DIAGNOSIS — R740 Nonspecific elevation of levels of transaminase and lactic acid dehydrogenase [LDH]: Secondary | ICD-10-CM | POA: Diagnosis not present

## 2019-01-18 DIAGNOSIS — R634 Abnormal weight loss: Secondary | ICD-10-CM | POA: Diagnosis not present

## 2019-01-19 ENCOUNTER — Other Ambulatory Visit: Payer: Self-pay | Admitting: Gastroenterology

## 2019-01-19 ENCOUNTER — Other Ambulatory Visit (HOSPITAL_COMMUNITY): Payer: Self-pay | Admitting: Gastroenterology

## 2019-01-19 ENCOUNTER — Encounter (HOSPITAL_COMMUNITY): Payer: Self-pay | Admitting: *Deleted

## 2019-01-19 ENCOUNTER — Other Ambulatory Visit: Payer: Self-pay

## 2019-01-19 DIAGNOSIS — K831 Obstruction of bile duct: Secondary | ICD-10-CM

## 2019-01-19 DIAGNOSIS — R9389 Abnormal findings on diagnostic imaging of other specified body structures: Secondary | ICD-10-CM

## 2019-01-19 NOTE — Progress Notes (Signed)
SPOKE W/  _patient via phone     SCREENING SYMPTOMS OF COVID 19:   COUGH--No  RUNNY NOSE---No   SORE THROAT---No  NASAL CONGESTION----No  SNEEZING----No  SHORTNESS OF BREATH---No  DIFFICULTY BREATHING---No  TEMP >100.4-----No  UNEXPLAINED BODY ACHES------No   HAVE YOU OR ANY FAMILY MEMBER TRAVELLED PAST 14 DAYS OUT OF THE   COUNTY---No STATE----No COUNTRY----No  HAVE YOU OR ANY FAMILY MEMBER BEEN EXPOSED TO ANYONE WITH COVID 19? No   Spoke with patient about Visitor Restrictions at this time due to Covid 19 and patient verbalized understanding.

## 2019-01-20 ENCOUNTER — Ambulatory Visit (HOSPITAL_COMMUNITY)
Admission: RE | Admit: 2019-01-20 | Discharge: 2019-01-20 | Disposition: A | Discharge status: Home / Self Care | Payer: Medicare Other | Source: Ambulatory Visit | Attending: Gastroenterology | Admitting: Gastroenterology

## 2019-01-20 ENCOUNTER — Other Ambulatory Visit (HOSPITAL_COMMUNITY): Payer: Self-pay | Admitting: Gastroenterology

## 2019-01-20 DIAGNOSIS — K831 Obstruction of bile duct: Secondary | ICD-10-CM

## 2019-01-20 DIAGNOSIS — K839 Disease of biliary tract, unspecified: Secondary | ICD-10-CM | POA: Diagnosis not present

## 2019-01-20 DIAGNOSIS — R9389 Abnormal findings on diagnostic imaging of other specified body structures: Secondary | ICD-10-CM

## 2019-01-20 MED ORDER — GADOBUTROL 1 MMOL/ML IV SOLN
7.5000 mL | Freq: Once | INTRAVENOUS | Status: AC | PRN
  Administered 2019-01-20: 09:00:00 7.5 mL via INTRAVENOUS

## 2019-01-21 ENCOUNTER — Ambulatory Visit (HOSPITAL_COMMUNITY): Payer: Medicare Other | Admitting: Physician Assistant

## 2019-01-21 ENCOUNTER — Encounter (HOSPITAL_COMMUNITY): Payer: Self-pay

## 2019-01-21 ENCOUNTER — Ambulatory Visit (HOSPITAL_COMMUNITY)
Admission: RE | Admit: 2019-01-21 | Discharge: 2019-01-21 | Disposition: A | Discharge status: Home / Self Care | Payer: Medicare Other | Attending: Gastroenterology | Admitting: Gastroenterology

## 2019-01-21 ENCOUNTER — Ambulatory Visit (HOSPITAL_COMMUNITY): Payer: Medicare Other

## 2019-01-21 ENCOUNTER — Other Ambulatory Visit: Payer: Self-pay

## 2019-01-21 ENCOUNTER — Encounter (HOSPITAL_COMMUNITY)
Admission: RE | Disposition: A | Discharge status: Home / Self Care | Payer: Self-pay | Source: Home / Self Care | Attending: Gastroenterology

## 2019-01-21 DIAGNOSIS — Z794 Long term (current) use of insulin: Secondary | ICD-10-CM | POA: Diagnosis not present

## 2019-01-21 DIAGNOSIS — K831 Obstruction of bile duct: Secondary | ICD-10-CM | POA: Diagnosis not present

## 2019-01-21 DIAGNOSIS — C240 Malignant neoplasm of extrahepatic bile duct: Secondary | ICD-10-CM | POA: Diagnosis not present

## 2019-01-21 DIAGNOSIS — K839 Disease of biliary tract, unspecified: Secondary | ICD-10-CM | POA: Diagnosis not present

## 2019-01-21 DIAGNOSIS — R932 Abnormal findings on diagnostic imaging of liver and biliary tract: Secondary | ICD-10-CM | POA: Insufficient documentation

## 2019-01-21 DIAGNOSIS — R740 Nonspecific elevation of levels of transaminase and lactic acid dehydrogenase [LDH]: Secondary | ICD-10-CM | POA: Diagnosis not present

## 2019-01-21 DIAGNOSIS — I10 Essential (primary) hypertension: Secondary | ICD-10-CM | POA: Diagnosis not present

## 2019-01-21 DIAGNOSIS — R9389 Abnormal findings on diagnostic imaging of other specified body structures: Secondary | ICD-10-CM

## 2019-01-21 DIAGNOSIS — I2510 Atherosclerotic heart disease of native coronary artery without angina pectoris: Secondary | ICD-10-CM | POA: Diagnosis not present

## 2019-01-21 DIAGNOSIS — E109 Type 1 diabetes mellitus without complications: Secondary | ICD-10-CM | POA: Diagnosis not present

## 2019-01-21 DIAGNOSIS — B181 Chronic viral hepatitis B without delta-agent: Secondary | ICD-10-CM | POA: Diagnosis not present

## 2019-01-21 DIAGNOSIS — Z9689 Presence of other specified functional implants: Secondary | ICD-10-CM | POA: Diagnosis not present

## 2019-01-21 DIAGNOSIS — Z79899 Other long term (current) drug therapy: Secondary | ICD-10-CM | POA: Diagnosis not present

## 2019-01-21 DIAGNOSIS — E119 Type 2 diabetes mellitus without complications: Secondary | ICD-10-CM | POA: Diagnosis not present

## 2019-01-21 HISTORY — PX: UPPER ESOPHAGEAL ENDOSCOPIC ULTRASOUND (EUS): SHX6562

## 2019-01-21 HISTORY — PX: ESOPHAGOGASTRODUODENOSCOPY (EGD) WITH PROPOFOL: SHX5813

## 2019-01-21 HISTORY — PX: ENDOSCOPIC RETROGRADE CHOLANGIOPANCREATOGRAPHY (ERCP) WITH PROPOFOL: SHX5810

## 2019-01-21 HISTORY — PX: BILIARY BRUSHING: SHX6843

## 2019-01-21 HISTORY — PX: BILIARY STENT PLACEMENT: SHX5538

## 2019-01-21 HISTORY — PX: SPHINCTEROTOMY: SHX5544

## 2019-01-21 LAB — GLUCOSE, CAPILLARY: Glucose-Capillary: 160 mg/dL — ABNORMAL HIGH (ref 70–99)

## 2019-01-21 SURGERY — ENDOSCOPIC RETROGRADE CHOLANGIOPANCREATOGRAPHY (ERCP) WITH PROPOFOL
Anesthesia: General

## 2019-01-21 MED ORDER — CIPROFLOXACIN IN D5W 400 MG/200ML IV SOLN
INTRAVENOUS | Status: DC | PRN
  Administered 2019-01-21: 400 mg via INTRAVENOUS

## 2019-01-21 MED ORDER — LACTATED RINGERS IV SOLN
INTRAVENOUS | Status: DC
  Administered 2019-01-21: 09:00:00 1000 mL via INTRAVENOUS

## 2019-01-21 MED ORDER — PROPOFOL 10 MG/ML IV BOLUS
INTRAVENOUS | Status: DC | PRN
  Administered 2019-01-21: 150 mg via INTRAVENOUS

## 2019-01-21 MED ORDER — LIDOCAINE 2% (20 MG/ML) 5 ML SYRINGE
INTRAMUSCULAR | Status: DC | PRN
  Administered 2019-01-21: 40 mg via INTRAVENOUS

## 2019-01-21 MED ORDER — ONDANSETRON HCL 4 MG/2ML IJ SOLN
INTRAMUSCULAR | Status: DC | PRN
  Administered 2019-01-21: 4 mg via INTRAVENOUS

## 2019-01-21 MED ORDER — FENTANYL CITRATE (PF) 250 MCG/5ML IJ SOLN
INTRAMUSCULAR | Status: AC
  Filled 2019-01-21: qty 5

## 2019-01-21 MED ORDER — GLUCAGON HCL RDNA (DIAGNOSTIC) 1 MG IJ SOLR
INTRAMUSCULAR | Status: AC
  Filled 2019-01-21: qty 1

## 2019-01-21 MED ORDER — FENTANYL CITRATE (PF) 100 MCG/2ML IJ SOLN
INTRAMUSCULAR | Status: DC | PRN
  Administered 2019-01-21: 100 ug via INTRAVENOUS

## 2019-01-21 MED ORDER — SODIUM CHLORIDE 0.9 % IV SOLN
INTRAVENOUS | Status: DC | PRN
  Administered 2019-01-21: 11:00:00 40 mL

## 2019-01-21 MED ORDER — INDOMETHACIN 50 MG RE SUPP
RECTAL | Status: AC
  Filled 2019-01-21: qty 2

## 2019-01-21 MED ORDER — PROPOFOL 10 MG/ML IV BOLUS
INTRAVENOUS | Status: AC
  Filled 2019-01-21: qty 20

## 2019-01-21 MED ORDER — MIDAZOLAM HCL 2 MG/2ML IJ SOLN
INTRAMUSCULAR | Status: AC
  Filled 2019-01-21: qty 2

## 2019-01-21 MED ORDER — DEXAMETHASONE SODIUM PHOSPHATE 10 MG/ML IJ SOLN
INTRAMUSCULAR | Status: DC | PRN
  Administered 2019-01-21: 4 mg via INTRAVENOUS

## 2019-01-21 MED ORDER — ROCURONIUM BROMIDE 10 MG/ML (PF) SYRINGE
PREFILLED_SYRINGE | INTRAVENOUS | Status: DC | PRN
  Administered 2019-01-21: 10 mg via INTRAVENOUS
  Administered 2019-01-21: 50 mg via INTRAVENOUS

## 2019-01-21 MED ORDER — SODIUM CHLORIDE 0.9 % IV SOLN: INTRAVENOUS | Status: DC

## 2019-01-21 MED ORDER — MIDAZOLAM HCL 5 MG/5ML IJ SOLN
INTRAMUSCULAR | Status: DC | PRN
  Administered 2019-01-21: 1 mg via INTRAVENOUS

## 2019-01-21 MED ORDER — INDOMETHACIN 50 MG RE SUPP
RECTAL | Status: DC | PRN
  Administered 2019-01-21: 100 mg via RECTAL

## 2019-01-21 MED ORDER — CIPROFLOXACIN IN D5W 400 MG/200ML IV SOLN
INTRAVENOUS | Status: AC
  Filled 2019-01-21: qty 200

## 2019-01-21 MED ORDER — SUCCINYLCHOLINE CHLORIDE 200 MG/10ML IV SOSY
PREFILLED_SYRINGE | INTRAVENOUS | Status: DC | PRN
  Administered 2019-01-21: 100 mg via INTRAVENOUS

## 2019-01-21 NOTE — Anesthesia Postprocedure Evaluation (Signed)
Anesthesia Post Note  Patient: Christopher Welch  Procedure(s) Performed: ENDOSCOPIC RETROGRADE CHOLANGIOPANCREATOGRAPHY (ERCP) WITH PROPOFOL (N/A ) UPPER ESOPHAGEAL ENDOSCOPIC ULTRASOUND (EUS) (N/A ) SPHINCTEROTOMY BILIARY STENT PLACEMENT (N/A )     Patient location during evaluation: PACU Anesthesia Type: General Level of consciousness: awake and alert Pain management: pain level controlled Vital Signs Assessment: post-procedure vital signs reviewed and stable Respiratory status: spontaneous breathing, nonlabored ventilation, respiratory function stable and patient connected to nasal cannula oxygen Cardiovascular status: blood pressure returned to baseline and stable Postop Assessment: no apparent nausea or vomiting Anesthetic complications: no    Last Vitals:  Vitals:   01/21/19 1110 01/21/19 1120  BP: (!) 150/79 (!) 150/77  Pulse: 75 73  Resp: 17 (!) 22  Temp:    SpO2: 100% 98%    Last Pain:  Vitals:   01/21/19 1120  TempSrc:   PainSc: 0-No pain                 Ryan P Ellender

## 2019-01-21 NOTE — Op Note (Signed)
[facility] Patient Name: Christopher Welch Procedure Date: 01/21/2019 MRN: [id number] Attending MD: Carol Ada , MD Date of Birth: Feb 08, 1946 CSN: [id number] Age: 73 Admit Type: Outpatient Procedure:                Upper EUS Indications:              Common bile duct dilation (acquired) seen on MRI Providers:                Carol Ada, MD, Cleda Daub, RN, Cherylynn Ridges,                            Technician Referring MD:              Medicines:                General Anesthesia Complications:            No immediate complications. Estimated Blood Loss:     Estimated blood loss: none. Procedure:                Pre-Anesthesia Assessment:                           - Prior to the procedure, a History and Physical                            was performed, and patient medications and                            allergies were reviewed. The patient's tolerance of                            previous anesthesia was also reviewed. The risks                            and benefits of the procedure and the sedation                            options and risks were discussed with the patient.                            All questions were answered, and informed consent                            was obtained. Prior Anticoagulants: The patient has                            taken no previous anticoagulant or antiplatelet                            agents. ASA Grade Assessment: II - A patient with                            mild systemic disease. After reviewing the risks  and benefits, the patient was deemed in                            satisfactory condition to undergo the procedure.                           - Sedation was administered by an anesthesia                            professional. General anesthesia was attained.                           After obtaining informed consent, the endoscope was                            passed under direct  vision. Throughout the                            procedure, the patient's blood pressure, pulse, and                            oxygen saturations were monitored continuously. The                            [id number] ([id number]) Olympus Linear EUS was                            introduced through the mouth, and advanced to the                            duodenal bulb. The upper EUS was technically                            difficult and complex. The patient tolerated the                            procedure well. Scope In: Scope Out: Findings:      ENDOSONOGRAPHIC FINDING: :      There was dilation in the middle third of the main bile duct and in the       upper third of the main bile duct which measured up to 15 mm.      There was a suggestion of a stricture in the middle third of the main       bile duct.      The procedure was difficult as the insufflation was not working. The       stricture was noted to be in the mid CBD rather than in the hilum. The       CBD at the stricture was thickened. Only a cursory examination of the       upper GI tract was obtained as a result of the insufflation issues.       There was no obvious mass to FNA. Impression:               - There was dilation in the middle third of the  main bile duct and in the upper third of the main                            bile duct which measured up to 15 mm.                           - There was a suggestion of a stricture in the                            middle third of the main bile duct.                           - No specimens collected. Moderate Sedation:      Not Applicable - Patient had care per Anesthesia. Recommendation:           - Proceed with the ERCP. Procedure Code(s):        --- Professional ---                           [phone], Esophagogastroduodenoscopy, flexible,                            transoral; with endoscopic ultrasound examination                             limited to the esophagus, stomach or duodenum, and                            adjacent structures Diagnosis Code(s):        --- Professional ---                           K83.8, Other specified diseases of biliary tract                           R93.2, Abnormal findings on diagnostic imaging of                            liver and biliary tract CPT copyright 2019 American Medical Association. All rights reserved. The codes documented in this report are preliminary and upon coder review may  be revised to meet current compliance requirements. Carol Ada, MD Carol Ada, MD 01/21/2019 11:12:45 AM This report has been signed electronically. Number of Addenda: 0

## 2019-01-21 NOTE — H&P (Signed)
Neysa Bonito HPI: The patient underwent further evaluation for his complaints of epigastric abdominal pain. He recalls that his pain started 3 weeks ago and it progressively worsened. The pain is sharp and it is becoming more persistent. PO intake does not worsen his symptoms and he denies any fever, nausea, or vomiting. His liver enzymes were abnormal: AST 285, ALT 378, AP 276, TB 8.2. An abdominal ultrasound was performed on 01/14/2019 with findings of gallbladder sludge, but his CBD is dilated at 16 mm. There is also an associated intrahepatic biliary ductal dilation. The reason for the obstruction is not clear. Weight loss is another feature of his symptoms and his baseline weight is around 160's lbs. In 06/1990 he was diagnosed with DM. His A1C ranges from 5.6-6.6%.  His MRCP obtained yesterday revealed the possibility of a proximal cholangiocarcinoma.  Past Medical History:  Diagnosis Date  . Diabetes mellitus type I, controlled ([place])   . Dyslipidemia   . ED (erectile dysfunction)   . Essential hypertension   . Gilbert's disease   . Hepatitis B carrier [facility])     Past Surgical History:  Procedure Laterality Date  . APPENDECTOMY  1964  . FEMORAL HERNIA REPAIR    . LEFT HEART CATH AND CORONARY ANGIOGRAPHY N/A 05/19/2017   Procedure: LEFT HEART CATH AND CORONARY ANGIOGRAPHY;  Surgeon: Leonie Man, MD;  Location: [facility];  Service: Cardiovascular;  Laterality: N/A;    Family History  Problem Relation Age of Onset  . Diabetes Father     Social History:  reports that he has never smoked. He has never used smokeless tobacco. He reports current alcohol use of about 7.0 standard drinks of alcohol per week. He reports that he does not use drugs.  Allergies: No Known Allergies  Medications: Scheduled: Continuous:  No results found for this or any previous visit (from the past 24 hour(s)).   Mr 3d Recon At Scanner  Result Date: 01/20/2019 CLINICAL DATA:   73 year old male with history of abdominal pain. Biliary tract dilatation noted on recent ultrasound examination. Follow-up study. EXAM: MRI ABDOMEN WITHOUT AND WITH CONTRAST (INCLUDING MRCP) TECHNIQUE: Multiplanar multisequence MR imaging of the abdomen was performed both before and after the administration of intravenous contrast. Heavily T2-weighted images of the biliary and pancreatic ducts were obtained, and three-dimensional MRCP images were rendered by post processing. CONTRAST:  7.5 mL of Gadavist. COMPARISON:  No prior abdominal MRI. Abdominal ultrasound 01/14/2019. FINDINGS: Lower chest: Unremarkable. Hepatobiliary: No discrete cystic or solid hepatic lesions. There is severe intrahepatic biliary ductal dilatation. Marked dilatation of the common hepatic duct, which measures up to 17 mm. Near the confluence of the common hepatic duct and cystic duct, and involving the proximal common bile duct there is a signal void on MRCP images (coronal image 84 of series 5). This corresponds to abnormal enhancing soft tissue which appears to occlude the duct best appreciated on axial images 63-69 of series 1103. Distal common bile duct is normal in caliber measuring 5 mm. Pancreas: No pancreatic mass. Ductal dilatation noted in the distal body and tail of the pancreas measuring up to 6 mm in diameter, where there is also extensive side branch ectasia. No peripancreatic fluid collections or inflammatory changes. Spleen:  Unremarkable. Adrenals/Urinary Tract: T1 hypointense, T2 hyperintense, nonenhancing lesions in the right kidney are compatible with simple cysts, largest of which measures 4.5 x 3.9 cm in the upper pole. Left kidney and bilateral adrenal glands are normal in appearance. No hydroureteronephrosis  in the visualized portions of the abdomen. Stomach/Bowel: Visualized portions are unremarkable. Vascular/Lymphatic: Aortic atherosclerosis, without evidence of aneurysm in the abdominal vasculature. No  lymphadenopathy noted in the abdomen. Other: No significant volume of ascites noted in the visualized portions of the peritoneal cavity. Musculoskeletal: No aggressive appearing osseous lesions are noted in the visualized portions of the skeleton. IMPRESSION: 1. Findings are highly concerning for central tumor in the biliary tract at the confluence of the common hepatic duct, cystic duct and common bile ducts. Further clinical evaluation for potential cholangiocarcinoma is strongly recommended. 2. Mild ductal dilatation of the main pancreatic duct throughout the distal body and tail of the pancreas where there is also some associated side branch ectasia. This may suggest a pancreatic ductal stricture. No obstructing pancreatic neoplasm identified. 3. Aortic atherosclerosis. Electronically Signed   By: Vinnie Langton M.D.   On: 01/20/2019 09:17   Mr Abdomen Mrcp Moise Boring Contast  Result Date: 01/20/2019 CLINICAL DATA:  73 year old male with history of abdominal pain. Biliary tract dilatation noted on recent ultrasound examination. Follow-up study. EXAM: MRI ABDOMEN WITHOUT AND WITH CONTRAST (INCLUDING MRCP) TECHNIQUE: Multiplanar multisequence MR imaging of the abdomen was performed both before and after the administration of intravenous contrast. Heavily T2-weighted images of the biliary and pancreatic ducts were obtained, and three-dimensional MRCP images were rendered by post processing. CONTRAST:  7.5 mL of Gadavist. COMPARISON:  No prior abdominal MRI. Abdominal ultrasound 01/14/2019. FINDINGS: Lower chest: Unremarkable. Hepatobiliary: No discrete cystic or solid hepatic lesions. There is severe intrahepatic biliary ductal dilatation. Marked dilatation of the common hepatic duct, which measures up to 17 mm. Near the confluence of the common hepatic duct and cystic duct, and involving the proximal common bile duct there is a signal void on MRCP images (coronal image 84 of series 5). This corresponds to abnormal  enhancing soft tissue which appears to occlude the duct best appreciated on axial images 63-69 of series 1103. Distal common bile duct is normal in caliber measuring 5 mm. Pancreas: No pancreatic mass. Ductal dilatation noted in the distal body and tail of the pancreas measuring up to 6 mm in diameter, where there is also extensive side branch ectasia. No peripancreatic fluid collections or inflammatory changes. Spleen:  Unremarkable. Adrenals/Urinary Tract: T1 hypointense, T2 hyperintense, nonenhancing lesions in the right kidney are compatible with simple cysts, largest of which measures 4.5 x 3.9 cm in the upper pole. Left kidney and bilateral adrenal glands are normal in appearance. No hydroureteronephrosis in the visualized portions of the abdomen. Stomach/Bowel: Visualized portions are unremarkable. Vascular/Lymphatic: Aortic atherosclerosis, without evidence of aneurysm in the abdominal vasculature. No lymphadenopathy noted in the abdomen. Other: No significant volume of ascites noted in the visualized portions of the peritoneal cavity. Musculoskeletal: No aggressive appearing osseous lesions are noted in the visualized portions of the skeleton. IMPRESSION: 1. Findings are highly concerning for central tumor in the biliary tract at the confluence of the common hepatic duct, cystic duct and common bile ducts. Further clinical evaluation for potential cholangiocarcinoma is strongly recommended. 2. Mild ductal dilatation of the main pancreatic duct throughout the distal body and tail of the pancreas where there is also some associated side branch ectasia. This may suggest a pancreatic ductal stricture. No obstructing pancreatic neoplasm identified. 3. Aortic atherosclerosis. Electronically Signed   By: Vinnie Langton M.D.   On: 01/20/2019 09:17    ROS:  As stated above in the HPI otherwise negative.  There were no vitals taken for this visit.  PE: Gen: NAD, Alert and Oriented HEENT:  [place]/AT,  EOMI Neck: Supple, no LAD Lungs: CTA Bilaterally CV: RRR without M/G/R ABM: Soft, NTND, +BS Ext: No C/C/E  Assessment/Plan: 1) Biliary obstruction - likely malignant.  Plan: 1) ERCP with brushings and stent placement.  Phyllicia Dudek D 01/21/2019, 8:40 AM

## 2019-01-21 NOTE — Transfer of Care (Signed)
Immediate Anesthesia Transfer of Care Note  Patient: Christopher Welch  Procedure(s) Performed: ENDOSCOPIC RETROGRADE CHOLANGIOPANCREATOGRAPHY (ERCP) WITH PROPOFOL (N/A ) UPPER ESOPHAGEAL ENDOSCOPIC ULTRASOUND (EUS) (N/A )  Patient Location: PACU and Endoscopy Unit  Anesthesia Type:General  Level of Consciousness: awake, drowsy, patient cooperative and responds to stimulation  Airway & Oxygen Therapy: Patient Spontanous Breathing and Patient connected to face mask oxygen  Post-op Assessment: Report given to RN and Post -op Vital signs reviewed and stable  Post vital signs: Reviewed and stable  Last Vitals:  Vitals Value Taken Time  BP 159/88 01/21/2019 11:05 AM  Temp    Pulse 78 01/21/2019 11:06 AM  Resp 19 01/21/2019 11:06 AM  SpO2 100 % 01/21/2019 11:06 AM  Vitals shown include unvalidated device data.  Last Pain:  Vitals:   01/21/19 1105  TempSrc: Oral  PainSc: 0-No pain         Complications: No apparent anesthesia complications

## 2019-01-21 NOTE — Anesthesia Procedure Notes (Signed)
Procedure Name: Intubation Date/Time: 01/21/2019 9:48 AM Performed by: Silas Sacramento, CRNA Pre-anesthesia Checklist: Patient identified, Emergency Drugs available, Suction available and Patient being monitored Patient Re-evaluated:Patient Re-evaluated prior to induction Oxygen Delivery Method: Circle system utilized Preoxygenation: Pre-oxygenation with 100% oxygen Induction Type: IV induction Laryngoscope Size: Mac and 4 Grade View: Grade I Tube type: Oral Tube size: 7.5 mm Number of attempts: 1 Airway Equipment and Method: Stylet Placement Confirmation: ETT inserted through vocal cords under direct vision,  positive ETCO2 and breath sounds checked- equal and bilateral Secured at: 23 cm Tube secured with: Tape Dental Injury: Teeth and Oropharynx as per pre-operative assessment

## 2019-01-21 NOTE — Anesthesia Preprocedure Evaluation (Addendum)
Anesthesia Evaluation  Patient identified by MRN, date of birth, ID band Patient awake    Reviewed: Allergy & Precautions, NPO status , Patient's Chart, lab work & pertinent test results  Airway Mallampati: II  TM Distance: >3 FB Neck ROM: Full    Dental no notable dental hx.    Pulmonary neg pulmonary ROS,    Pulmonary exam normal breath sounds clear to auscultation       Cardiovascular hypertension, Pt. on medications + CAD  Normal cardiovascular exam Rhythm:Regular Rate:Normal     Neuro/Psych negative neurological ROS  negative psych ROS   GI/Hepatic negative GI ROS, (+) Hepatitis -, B  Endo/Other  diabetes, Insulin Dependent, Oral Hypoglycemic Agents  Renal/GU negative Renal ROS     Musculoskeletal negative musculoskeletal ROS (+)   Abdominal   Peds  Hematology HLD   Anesthesia Other Findings abnormal ultrasound-dilated CBD and intrahepatic biliary ducts  Reproductive/Obstetrics                            Anesthesia Physical Anesthesia Plan  ASA: III  Anesthesia Plan: General   Post-op Pain Management:    Induction: Intravenous  PONV Risk Score and Plan: 3 and Ondansetron, Dexamethasone and Treatment may vary due to age or medical condition  Airway Management Planned: Oral ETT  Additional Equipment:   Intra-op Plan:   Post-operative Plan: Extubation in OR  Informed Consent: I have reviewed the patients History and Physical, chart, labs and discussed the procedure including the risks, benefits and alternatives for the proposed anesthesia with the patient or authorized representative who has indicated his/her understanding and acceptance.     Dental advisory given  Plan Discussed with: CRNA  Anesthesia Plan Comments:         Anesthesia Quick Evaluation

## 2019-01-21 NOTE — Discharge Instructions (Signed)
YOU HAD AN ENDOSCOPIC PROCEDURE TODAY: Refer to the procedure report and other information in the discharge instructions given to you for any specific questions about what was found during the examination. If this information does not answer your questions, please call [facility] at [phone] to clarify.  ° °YOU SHOULD EXPECT: Some feelings of bloating in the abdomen. Passage of more gas than usual. Walking can help get rid of the air that was put into your GI tract during the procedure and reduce the bloating. ° °DIET: Your first meal following the procedure should be a light meal and then it is ok to progress to your normal diet. A half-sandwich or bowl of soup is an example of a good first meal. Heavy or fried foods are harder to digest and may make you feel nauseous or bloated. Drink plenty of fluids but you should avoid alcoholic beverages for 24 hours. If you had an esophageal dilation, please see attached information for diet.  ° °ACTIVITY: Your care partner should take you home directly after the procedure. You should plan to take it easy, moving slowly for the rest of the day. You can resume normal activity the day after the procedure however YOU SHOULD NOT DRIVE, use power tools, machinery or perform tasks that involve climbing or major physical exertion for 24 hours (because of the sedation medicines used during the test).  ° °SYMPTOMS TO REPORT IMMEDIATELY: °A gastroenterologist can be reached at any hour. Please call [phone]  for any of the following symptoms:  ° °Following upper endoscopy (EGD, EUS, ERCP, esophageal dilation) °Vomiting of blood or coffee ground material  °New, significant abdominal pain  °New, significant chest pain or pain under the shoulder blades  °Painful or persistently difficult swallowing  °New shortness of breath  °Black, tarry-looking or red, bloody stools ° °FOLLOW UP:  °If any biopsies were taken you will be contacted by phone or by letter within the next  1-3 weeks. Call [phone]  if you have not heard about the biopsies in 3 weeks.  °Please also call with any specific questions about appointments or follow up tests. ° °Coronavirus (COVID-19) Are you at risk? ° °Are you at risk for the Coronavirus (COVID-19)? ° °To be considered high risk for Coronavirus (COVID-19), you have to meet the following criteria: °· Traveled to [place], [place], [place], [place] or [place]; or in the [place] to [place], [place], [place], or [place]; and have fever, cough, and shortness of breath within the last 2 weeks of travel or °· Been in close contact with a person diagnosed with COVID-19 within the last 2 weeks and have fever, cough, and shortness of breath °· If you do not meet these criteria, you are considered low risk for COVID-19. ° °What to do if you are high risk for COVID-19? ° °• If you are having a medical emergency, call 911. °• Seek medical care right away. Before you go to a doctor’s office, urgent care or emergency department, call ahead and tell them about your recent travel, contact with someone diagnosed with COVID-19, and your symptoms. You should receive instructions from your physician’s office regarding next steps of care.  °• When you arrive at healthcare provider, tell the healthcare staff immediately you have returned from visiting [place], [place], [place], [place] or [place]; or traveled in the [place] to [place], [place], [place], or [place]; in the last two weeks or you have been in close contact with   a person diagnosed with COVID-19 in the last 2 weeks.   °• Tell the health care staff about your symptoms: fever, cough and shortness of breath. °• After you have been seen by a medical provider, you will be either: °o Tested for (COVID-19) and discharged home on quarantine except to seek medical care if symptoms worsen, and asked to  °- Stay home and avoid contact with others until you get your results (4-5 days)  °- Avoid travel  on public transportation if possible (such as bus, train, or airplane) or °o Sent to the Emergency Department by EMS for evaluation, COVID-19 testing, and possible admission depending on your condition and test results. ° ° °What to do if you are LOW RISK for COVID-19? ° °Reduce your risk of any infection by using the same precautions used for avoiding the common cold or flu:  °• Wash your hands often with soap and warm water for at least 20 seconds.  If soap and water are not readily available, use an alcohol-based hand sanitizer with at least 60% alcohol.  °• If coughing or sneezing, cover your mouth and nose by coughing or sneezing into the elbow areas of your shirt or coat, into a tissue or into your sleeve (not your hands). °• Avoid shaking hands with others and consider head nods or verbal greetings only. °• Avoid touching your eyes, nose, or mouth with unwashed hands.  °• Avoid close contact with people who are sick. °• Avoid places or events with large numbers of people in one location, like concerts or sporting events. °• Carefully consider travel plans you have or are making. °• If you are planning any travel outside or inside the [place], visit the CDC’s Travelers’ Health webpage for the latest health notices. °• If you have some symptoms but not all symptoms, continue to monitor at home and seek medical attention if your symptoms worsen. °• If you are having a medical emergency, call 911. ° ° °Additional healthcare options for patients ° °[facility] / e-Visit:  [email or web address]          ° °[facility]: [phone] ° °[facility]: [phone]            °       °[facility]: [phone]  ° °

## 2019-01-21 NOTE — Op Note (Signed)
[facility] Patient Name: Christopher Welch Procedure Date: 01/21/2019 MRN: [id number] Attending MD: Carol Ada , MD Date of Birth: 1946-05-26 CSN: [id number] Age: 73 Admit Type: Outpatient Procedure:                ERCP Indications:              Common bile duct stricture Providers:                Carol Ada, MD, Cleda Daub, RN, Cherylynn Ridges,                            Technician Referring MD:              Medicines:                General Anesthesia Complications:            No immediate complications. Estimated Blood Loss:     Estimated blood loss was minimal. Procedure:                Pre-Anesthesia Assessment:                           - Prior to the procedure, a History and Physical                            was performed, and patient medications and                            allergies were reviewed. The patient's tolerance of                            previous anesthesia was also reviewed. The risks                            and benefits of the procedure and the sedation                            options and risks were discussed with the patient.                            All questions were answered, and informed consent                            was obtained. Prior Anticoagulants: The patient has                            taken no previous anticoagulant or antiplatelet                            agents. ASA Grade Assessment: II - A patient with                            mild systemic disease. After reviewing the risks                            and  benefits, the patient was deemed in                            satisfactory condition to undergo the procedure.                           - Sedation was administered by an anesthesia                            professional. General anesthesia was attained.                           After obtaining informed consent, the scope was                            passed under direct vision. Throughout the                       procedure, the patient's blood pressure, pulse, and                            oxygen saturations were monitored continuously. The                            [id number]-Q180V ([id number] Olympus duodenoscope was                            introduced through the mouth, and used to inject                            contrast into and used to inject contrast into the                            bile duct. The ERCP was accomplished without                            difficulty. The patient tolerated the procedure                            well. Scope In: Scope Out: Findings:      The major papilla was normal. The bile duct was deeply cannulated with       the short-nosed traction sphincterotome. Contrast was injected. I       personally interpreted the bile duct images. There was brisk flow of       contrast through the ducts. Image quality was excellent. Contrast       extended to the entire biliary tree. The middle third of the main bile       duct contained a single localized stenosis 4 mm in length. The upper       third of the main bile duct and main bile duct were diffusely dilated,       secondary to a stricture. The largest diameter was 15 mm. A short 0.035       inch Soft Jagwire was passed into the biliary tree. A 10 mm biliary       sphincterotomy was  made with a traction (standard) sphincterotome using       ERBE electrocautery. There was no post-sphincterotomy bleeding. Cells       for cytology were obtained by brushing in the middle third of the main       bile duct. One 8.5 Fr by 7 cm plastic stent with a single external flap       and a single internal flap was placed 6 cm into the common bile duct.       Clear fluid flowed through the stent. The stent was in good position.      The ampulla was cannulated, but the guidewire did not pass into the CBD.       With stable engagement of the ampulla a small sphincterotomy was       created. This allowed for easy  cannulation of the CBD and the guidewire       was secured in the left intrahepatic ducts. Contrast injection revealed       dilated intrahepatics and the proximal CBD. In the mid CBD there was       evidence of a 3-4 cm stricture, which was consistent with the EUS       findings. The sphincterotomy was extended and then the stricture was       brushed twice, using two separate brushes. An 8.5 Fr x 7 cm stent was       successfully placed and there was excellent drainage of contrast.       Fluoroscopic images confirmed excellent placement of the stent. Impression:               - The major papilla appeared normal.                           - A single localized biliary stricture was found in                            the middle third of the main bile duct.                           - The entire main bile duct and upper third of the                            main bile duct were dilated, secondary to a                            stricture.                           - A biliary sphincterotomy was performed.                           - Cells for cytology obtained in the middle third                            of the main bile duct.                           - One plastic stent was placed into the common bile  duct. Moderate Sedation:      Not Applicable - Patient had care per Anesthesia. Recommendation:           - Patient has a contact number available for                            emergencies. The signs and symptoms of potential                            delayed complications were discussed with the                            patient. Return to normal activities tomorrow.                            Written discharge instructions were provided to the                            patient.                           - Resume regular diet.                           - Await cytology results. Procedure Code(s):        --- Professional ---                            [phone], Endoscopic retrograde                            cholangiopancreatography (ERCP); with placement of                            endoscopic stent into biliary or pancreatic duct,                            including pre- and post-dilation and guide wire                            passage, when performed, including sphincterotomy,                            when performed, each stent                           [id number], Endoscopic catheterization of the biliary                            ductal system, radiological supervision and                            interpretation Diagnosis Code(s):        --- Professional ---                           K83.1, Obstruction of bile duct CPT copyright 2019  [employer]. All rights reserved. The codes documented in this report are preliminary and upon coder review may  be revised to meet current compliance requirements. Carol Ada, MD Carol Ada, MD 01/21/2019 11:05:48 AM This report has been signed electronically. Number of Addenda: 0

## 2019-01-22 LAB — CANCER ANTIGEN 19-9: CA 19-9: 682 U/mL — ABNORMAL HIGH (ref 0–35)

## 2019-01-29 DIAGNOSIS — K8689 Other specified diseases of pancreas: Secondary | ICD-10-CM | POA: Diagnosis not present

## 2019-02-01 DIAGNOSIS — Z8249 Family history of ischemic heart disease and other diseases of the circulatory system: Secondary | ICD-10-CM | POA: Diagnosis not present

## 2019-02-01 DIAGNOSIS — I10 Essential (primary) hypertension: Secondary | ICD-10-CM | POA: Diagnosis not present

## 2019-02-01 DIAGNOSIS — E785 Hyperlipidemia, unspecified: Secondary | ICD-10-CM | POA: Diagnosis not present

## 2019-02-01 DIAGNOSIS — E7849 Other hyperlipidemia: Secondary | ICD-10-CM | POA: Diagnosis not present

## 2019-02-01 DIAGNOSIS — E109 Type 1 diabetes mellitus without complications: Secondary | ICD-10-CM | POA: Diagnosis not present

## 2019-02-01 DIAGNOSIS — Z87891 Personal history of nicotine dependence: Secondary | ICD-10-CM | POA: Diagnosis not present

## 2019-02-01 DIAGNOSIS — C221 Intrahepatic bile duct carcinoma: Secondary | ICD-10-CM | POA: Diagnosis not present

## 2019-02-01 DIAGNOSIS — Z01818 Encounter for other preprocedural examination: Secondary | ICD-10-CM | POA: Diagnosis not present

## 2019-02-01 DIAGNOSIS — I2510 Atherosclerotic heart disease of native coronary artery without angina pectoris: Secondary | ICD-10-CM | POA: Diagnosis not present

## 2019-02-02 DIAGNOSIS — K838 Other specified diseases of biliary tract: Secondary | ICD-10-CM | POA: Diagnosis not present

## 2019-02-02 DIAGNOSIS — I2510 Atherosclerotic heart disease of native coronary artery without angina pectoris: Secondary | ICD-10-CM | POA: Diagnosis not present

## 2019-02-02 DIAGNOSIS — E7849 Other hyperlipidemia: Secondary | ICD-10-CM | POA: Diagnosis not present

## 2019-02-02 DIAGNOSIS — C221 Intrahepatic bile duct carcinoma: Secondary | ICD-10-CM | POA: Diagnosis not present

## 2019-02-10 DIAGNOSIS — Z9641 Presence of insulin pump (external) (internal): Secondary | ICD-10-CM | POA: Diagnosis present

## 2019-02-10 DIAGNOSIS — Z79899 Other long term (current) drug therapy: Secondary | ICD-10-CM | POA: Diagnosis not present

## 2019-02-10 DIAGNOSIS — I10 Essential (primary) hypertension: Secondary | ICD-10-CM | POA: Diagnosis present

## 2019-02-10 DIAGNOSIS — Z1159 Encounter for screening for other viral diseases: Secondary | ICD-10-CM | POA: Diagnosis not present

## 2019-02-10 DIAGNOSIS — Z794 Long term (current) use of insulin: Secondary | ICD-10-CM | POA: Diagnosis not present

## 2019-02-10 DIAGNOSIS — R590 Localized enlarged lymph nodes: Secondary | ICD-10-CM | POA: Diagnosis not present

## 2019-02-10 DIAGNOSIS — E109 Type 1 diabetes mellitus without complications: Secondary | ICD-10-CM | POA: Diagnosis present

## 2019-02-10 DIAGNOSIS — Z7984 Long term (current) use of oral hypoglycemic drugs: Secondary | ICD-10-CM | POA: Diagnosis not present

## 2019-02-10 DIAGNOSIS — Z87891 Personal history of nicotine dependence: Secondary | ICD-10-CM | POA: Diagnosis not present

## 2019-02-10 DIAGNOSIS — G8918 Other acute postprocedural pain: Secondary | ICD-10-CM | POA: Diagnosis not present

## 2019-02-10 DIAGNOSIS — Z5309 Procedure and treatment not carried out because of other contraindication: Secondary | ICD-10-CM | POA: Diagnosis present

## 2019-02-10 DIAGNOSIS — C221 Intrahepatic bile duct carcinoma: Secondary | ICD-10-CM | POA: Diagnosis present

## 2019-02-11 DIAGNOSIS — E109 Type 1 diabetes mellitus without complications: Secondary | ICD-10-CM | POA: Diagnosis not present

## 2019-02-11 DIAGNOSIS — G8918 Other acute postprocedural pain: Secondary | ICD-10-CM | POA: Diagnosis not present

## 2019-02-17 DIAGNOSIS — Z01818 Encounter for other preprocedural examination: Secondary | ICD-10-CM | POA: Diagnosis not present

## 2019-02-17 DIAGNOSIS — E119 Type 2 diabetes mellitus without complications: Secondary | ICD-10-CM | POA: Diagnosis not present

## 2019-02-17 DIAGNOSIS — I10 Essential (primary) hypertension: Secondary | ICD-10-CM | POA: Diagnosis not present

## 2019-02-17 DIAGNOSIS — C221 Intrahepatic bile duct carcinoma: Secondary | ICD-10-CM | POA: Diagnosis not present

## 2019-02-23 DIAGNOSIS — K831 Obstruction of bile duct: Secondary | ICD-10-CM | POA: Diagnosis not present

## 2019-02-23 DIAGNOSIS — Z1159 Encounter for screening for other viral diseases: Secondary | ICD-10-CM | POA: Diagnosis not present

## 2019-02-23 DIAGNOSIS — Z4659 Encounter for fitting and adjustment of other gastrointestinal appliance and device: Secondary | ICD-10-CM | POA: Diagnosis not present

## 2019-02-23 DIAGNOSIS — E119 Type 2 diabetes mellitus without complications: Secondary | ICD-10-CM | POA: Diagnosis not present

## 2019-02-23 DIAGNOSIS — Z87891 Personal history of nicotine dependence: Secondary | ICD-10-CM | POA: Diagnosis not present

## 2019-02-23 DIAGNOSIS — Z79899 Other long term (current) drug therapy: Secondary | ICD-10-CM | POA: Diagnosis not present

## 2019-02-23 DIAGNOSIS — I10 Essential (primary) hypertension: Secondary | ICD-10-CM | POA: Diagnosis not present

## 2019-02-23 DIAGNOSIS — Z881 Allergy status to other antibiotic agents status: Secondary | ICD-10-CM | POA: Diagnosis not present

## 2019-02-23 DIAGNOSIS — Z9049 Acquired absence of other specified parts of digestive tract: Secondary | ICD-10-CM | POA: Diagnosis not present

## 2019-02-23 DIAGNOSIS — Z794 Long term (current) use of insulin: Secondary | ICD-10-CM | POA: Diagnosis not present

## 2019-02-23 DIAGNOSIS — Z9689 Presence of other specified functional implants: Secondary | ICD-10-CM | POA: Diagnosis not present

## 2019-02-23 DIAGNOSIS — C221 Intrahepatic bile duct carcinoma: Secondary | ICD-10-CM | POA: Diagnosis not present

## 2019-02-24 DIAGNOSIS — C240 Malignant neoplasm of extrahepatic bile duct: Secondary | ICD-10-CM | POA: Diagnosis not present

## 2019-02-24 DIAGNOSIS — R1031 Right lower quadrant pain: Secondary | ICD-10-CM | POA: Diagnosis not present

## 2019-02-24 DIAGNOSIS — R1032 Left lower quadrant pain: Secondary | ICD-10-CM | POA: Diagnosis not present

## 2019-03-03 ENCOUNTER — Ambulatory Visit
Admission: RE | Admit: 2019-03-03 | Discharge: 2019-03-03 | Disposition: A | Discharge status: Home / Self Care | Payer: Self-pay | Source: Ambulatory Visit | Attending: Hematology | Admitting: Hematology

## 2019-03-03 DIAGNOSIS — C221 Intrahepatic bile duct carcinoma: Secondary | ICD-10-CM

## 2019-03-03 NOTE — Progress Notes (Signed)
Called patient to introduce my role of Gi navigator and to explain the referral process. Patient requested to be seen quickly. He is scheduled to see Christopher Welch on 03/04/19 @ 1:45 PM. Patient has my contact information. Patient lives with SO, has transportation and support. No barriers to care identified.  [facility] and then faxed request for CT dual pancrease including CT CAP be uploaded to [employer]. Will follow as needed.

## 2019-03-04 ENCOUNTER — Encounter: Payer: Self-pay | Admitting: Nurse Practitioner

## 2019-03-04 ENCOUNTER — Other Ambulatory Visit: Payer: Self-pay

## 2019-03-04 ENCOUNTER — Inpatient Hospital Stay: Payer: Medicare Other | Attending: Hematology | Admitting: Nurse Practitioner

## 2019-03-04 ENCOUNTER — Ambulatory Visit: Payer: Medicare Other | Admitting: Hematology

## 2019-03-04 VITALS — BP 134/64 | HR 71 | Temp 97.7°F | Resp 18 | Ht 68.0 in | Wt 146.8 lb

## 2019-03-04 DIAGNOSIS — Z7289 Other problems related to lifestyle: Secondary | ICD-10-CM | POA: Insufficient documentation

## 2019-03-04 DIAGNOSIS — I700 Atherosclerosis of aorta: Secondary | ICD-10-CM | POA: Insufficient documentation

## 2019-03-04 DIAGNOSIS — I2510 Atherosclerotic heart disease of native coronary artery without angina pectoris: Secondary | ICD-10-CM | POA: Insufficient documentation

## 2019-03-04 DIAGNOSIS — E785 Hyperlipidemia, unspecified: Secondary | ICD-10-CM | POA: Diagnosis not present

## 2019-03-04 DIAGNOSIS — I10 Essential (primary) hypertension: Secondary | ICD-10-CM | POA: Insufficient documentation

## 2019-03-04 DIAGNOSIS — Z79899 Other long term (current) drug therapy: Secondary | ICD-10-CM | POA: Insufficient documentation

## 2019-03-04 DIAGNOSIS — Z881 Allergy status to other antibiotic agents status: Secondary | ICD-10-CM | POA: Insufficient documentation

## 2019-03-04 DIAGNOSIS — R197 Diarrhea, unspecified: Secondary | ICD-10-CM | POA: Diagnosis not present

## 2019-03-04 DIAGNOSIS — R5383 Other fatigue: Secondary | ICD-10-CM | POA: Diagnosis not present

## 2019-03-04 DIAGNOSIS — Z5111 Encounter for antineoplastic chemotherapy: Secondary | ICD-10-CM | POA: Insufficient documentation

## 2019-03-04 DIAGNOSIS — R110 Nausea: Secondary | ICD-10-CM | POA: Insufficient documentation

## 2019-03-04 DIAGNOSIS — Z833 Family history of diabetes mellitus: Secondary | ICD-10-CM | POA: Insufficient documentation

## 2019-03-04 DIAGNOSIS — M546 Pain in thoracic spine: Secondary | ICD-10-CM | POA: Diagnosis not present

## 2019-03-04 DIAGNOSIS — B181 Chronic viral hepatitis B without delta-agent: Secondary | ICD-10-CM | POA: Diagnosis not present

## 2019-03-04 DIAGNOSIS — R634 Abnormal weight loss: Secondary | ICD-10-CM | POA: Insufficient documentation

## 2019-03-04 DIAGNOSIS — E7800 Pure hypercholesterolemia, unspecified: Secondary | ICD-10-CM | POA: Diagnosis not present

## 2019-03-04 DIAGNOSIS — C221 Intrahepatic bile duct carcinoma: Secondary | ICD-10-CM | POA: Diagnosis not present

## 2019-03-04 DIAGNOSIS — C240 Malignant neoplasm of extrahepatic bile duct: Secondary | ICD-10-CM | POA: Insufficient documentation

## 2019-03-04 DIAGNOSIS — Z794 Long term (current) use of insulin: Secondary | ICD-10-CM | POA: Insufficient documentation

## 2019-03-04 DIAGNOSIS — R590 Localized enlarged lymph nodes: Secondary | ICD-10-CM | POA: Insufficient documentation

## 2019-03-04 DIAGNOSIS — E119 Type 2 diabetes mellitus without complications: Secondary | ICD-10-CM | POA: Diagnosis not present

## 2019-03-04 DIAGNOSIS — N281 Cyst of kidney, acquired: Secondary | ICD-10-CM | POA: Insufficient documentation

## 2019-03-04 DIAGNOSIS — R000 Tachycardia, unspecified: Secondary | ICD-10-CM | POA: Diagnosis not present

## 2019-03-04 DIAGNOSIS — E10319 Type 1 diabetes mellitus with unspecified diabetic retinopathy without macular edema: Secondary | ICD-10-CM | POA: Diagnosis not present

## 2019-03-04 DIAGNOSIS — G4700 Insomnia, unspecified: Secondary | ICD-10-CM | POA: Diagnosis not present

## 2019-03-04 DIAGNOSIS — Z7189 Other specified counseling: Secondary | ICD-10-CM | POA: Diagnosis not present

## 2019-03-04 NOTE — Progress Notes (Signed)
Met with patient prior to med/onc appointment to provide educational materials on bile duct cancer, available [facility] online support groups, treatment team contact numbers and information on symptom management clinic. After appointment I sat down to demonstrate PAC and explained what the procedure will be like. He understands that our chemo educator will speak with him to provide detailed information on PAC care and treatment plan. I took him on a tour of the infusion room and the support center downstairs. Patient is aware that we have LCSW's and a chaplain if he needs additional support.  At this time no barriers to care identified. 

## 2019-03-04 NOTE — Progress Notes (Addendum)
[place]  [phone]) [phone] Fax:([phone]) [place] Note   Patient Care Team: [name], DO as PCP - General (Family Medicine) 03/04/2019  CHIEF COMPLAINTS/PURPOSE OF CONSULTATION:  Cholangiocarcinoma; referred by Dr. Leamon Arnt at [place]:  Christopher Welch 73 y.o. male is here because of cholangiocarcinoma previously seen by oncologist Dr. Leamon Arnt at [facility] for epigastric pain and weight loss. LFTs per intake note from 12/2018 AST 285, ALT 378, AP 276, Tbili 8.2.  Abdominal US on 01/14/19 showed gallbladder sludge and significant intrahepatic and extrahepatic biliary dilatation concerning for distal CBD obstruction. He underwent MRI/MRCP on 01/20/19 showing mid CBD stricture, moderate pancreatic ductal dilatation. No definite mass. On 01/21/19 he proceeded with EUS/ERCP which showed biliary stricture in the middle third of the main bile duct. Plastic biliary stent was placed. Bile duct brushing confirmed adenocarcinoma. CA 19-9 on 01/21/19 was 682, came down to 129 on 02/02/19 at [facility] after stent placement. Normal CEA. Staging CT on 02/03/19 showed 1.6 cm area of abnormal soft tissue abutting the distal common hepatic artery, right hepatic artery and the portal vein; there was periportal adenopathy concerning for nodal metastasis. Chest was negative. He was taken to the OR on 02/10/19 for pancreaticoduodenectomy per Dr. Zenia Resides which was aborted due to arterial and venous involvement. Biopsy of the hepatic artery lymph node showed reactive follicular lymphoid hyperplasia, negative for malignancy. Post-op he developed chills and dark emesis, he returned to Dr. Leamon Arnt on 02/23/19. ERCP on 02/23/19 which showed severe mid bile duct malignant-appearing stricture. A metal stent was placed. I do not have that report. He was referred to our clinic closer to his home to initiate chemotherapy.   Past medical history if significant for non-obstructive CAD  followed by Dr. Ellyn Hack, DM with retinopathy on insulin, HL, HTN. He is . 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 He is single, lives alone. He does not have children. He has a close friend in town who is his [place], Adine Madura.  He denies current or past heavy alcohol. Denies tobacco use. Denies family history of cancer.   Today he feels OK. He has low appetite, has lost 20 lbs since early April when symptoms began. Eating a lot upsets his stomach. Recently he has had nausea with vomiting, constipation and diarrhea at various times. He attributed his vomiting to constipation, which is better now. Takes stool softener. He denies abdominal pain. He has lower-mid back pain because his core cannot support him well since surgery. Pain regimen consists of tylenol plus oxycodone at night, and 1 tab oxycodone during the day PRN, this is effective. Has moderate fatigue "at 50% capacity" but no trouble with ADLs. He is nervous and anxious. He denies fever, chills, cough, chest pain, dyspnea, leg edema. He denies neuropathy from DM.   MEDICAL HISTORY:  Past Medical History:  Diagnosis Date  . Diabetes mellitus type I, controlled ([place])   . Dyslipidemia   . ED (erectile dysfunction)   . Essential hypertension   . Gilbert's disease   . Hepatitis B carrier [facility])     SURGICAL HISTORY: Past Surgical History:  Procedure Laterality Date  . APPENDECTOMY  1964  . BILIARY BRUSHING  01/21/2019   Procedure: BILIARY BRUSHING;  Surgeon: Carol Ada, MD;  Location: [facility] ENDOSCOPY;  Service: Endoscopy;;  . BILIARY STENT PLACEMENT  N/A 01/21/2019   Procedure: BILIARY STENT PLACEMENT;  Surgeon: Carol Ada, MD;  Location: [facility] ENDOSCOPY;  Service: Endoscopy;  Laterality: N/A;  . ENDOSCOPIC RETROGRADE CHOLANGIOPANCREATOGRAPHY (ERCP) WITH PROPOFOL N/A 01/21/2019   Procedure: ENDOSCOPIC  RETROGRADE CHOLANGIOPANCREATOGRAPHY (ERCP) WITH PROPOFOL;  Surgeon: Carol Ada, MD;  Location: [facility] ENDOSCOPY;  Service: Endoscopy;  Laterality: N/A;  . ESOPHAGOGASTRODUODENOSCOPY (EGD) WITH PROPOFOL N/A 01/21/2019   Procedure: ESOPHAGOGASTRODUODENOSCOPY (EGD) WITH PROPOFOL;  Surgeon: Carol Ada, MD;  Location: [facility] ENDOS[facility];  Service: Endoscopy;  Laterality: N/A;  . FEMORAL HERNIA REPAIR    . LEFT HEART CATH AND CORONARY ANGIOGRAPHY N/A 05/19/2017   Procedure: LEFT HEART CATH AND CORONARY ANGIOGRAPHY;  Surgeon: Leonie Man, MD;  Location: [facility] LAB;  Service: Cardiovascular;  Laterality: N/A;  . SPHINCTEROTOMY  01/21/2019   Procedure: SPHINCTEROTOMY;  Surgeon: Carol Ada, MD;  Location: [facility];  Service: Endoscopy;;  . UPPER ESOPHAGEAL ENDOSCOPIC ULTRASOUND (EUS) N/A 01/21/2019   Procedure: UPPER ESOPHAGEAL ENDOSCOPIC ULTRASOUND (EUS);  Surgeon: Carol Ada, MD;  Location: [facility];  Service: Endoscopy;  Laterality: N/A;    SOCIAL HISTORY: Social History   Socioeconomic History  . Marital status: Single    Spouse name: Not on file  . Number of children: Not on file  . Years of education: Not on file  . Highest education level: Not on file  Occupational History  . Not on file  Social Needs  . Financial resource strain: Not on file  . Food insecurity:    Worry: Not on file    Inability: Not on file  . Transportation needs:    Medical: Not on file    Non-medical: Not on file  Tobacco Use  . Smoking status: Never Smoker  . Smokeless tobacco: Never Used  Substance and Sexual Activity  . Alcohol use: Yes    Alcohol/week: 7.0 standard drinks    Types: 7 Glasses of wine per week  . Drug use: No  . Sexual activity: Yes  Lifestyle  . Physical activity:    Days per week: Not on file    Minutes per session: Not on file  . Stress: Not on file  Relationships  . Social connections:    Talks on phone: Not on file    Gets together: Not on file    Attends  religious service: Not on file    Active member of club or organization: Not on file    Attends meetings of clubs or organizations: Not on file    Relationship status: Not on file  . Intimate partner violence:    Fear of current or ex partner: Not on file    Emotionally abused: Not on file    Physically abused: Not on file    Forced sexual activity: Not on file  Other Topics Concern  . Not on file  Social History Narrative   Lives alone in a one story home - "partner" No children.    .  Works part time at [employer].   Education: masters in education   Quit smoking in 1984. Did have passive smoke exposure prior to that with his father.   Drains 5 glasses of wine per week. 2 cups of coffee daily.   Works out at [employer] regularly - 5 days per week.    FAMILY HISTORY: Family History  Problem Relation Age of Onset  . Diabetes Father     ALLERGIES:  has No Known Allergies.  MEDICATIONS:  Current Outpatient Medications  Medication Sig  Dispense Refill  . amLODipine (NORVASC) 5 MG tablet Take 5 mg by mouth daily.     Marland Kitchen atenolol (TENORMIN) 25 MG tablet Take by mouth daily. Once a day    . atorvastatin (LIPITOR) 10 MG tablet Take 10 mg by mouth daily.    . Blood Glucose Monitoring Suppl (ACCU-CHEK NANO SMARTVIEW) W/DEVICE KIT 1 kit by Does not apply route 2 (two) times daily. (Patient taking differently: 1 kit by Does not apply route See admin instructions. Test blood sugars 12x's daily) 1 kit 0  . DiphenhydrAMINE HCl (ZZZQUIL) 50 MG/30ML LIQD Take 60 mLs by mouth at bedtime.    Marland Kitchen glucose blood test strip Test 3 times a day. (Patient taking differently: 1 each by Other route See admin instructions. Test 12 times a day.) 300 each Prn  . insulin NPH Human (NOVOLIN N) 100 UNIT/ML injection Inject 10 Units into the skin 2 (two) times a day.    . insulin regular (NOVOLIN R) 100 units/mL injection Inject 10 Units into the skin 3 (three) times daily before meals.    Marland Kitchen lisinopril (ZESTRIL) 20  MG tablet Take 20 mg by mouth daily.     . metFORMIN (GLUCOPHAGE) 1000 MG tablet Take 2,000 mg by mouth every evening.     . Niacin CR (SLO-NIACIN) 750 MG TBCR Take 1,500 mg by mouth at bedtime.    Marland Kitchen omeprazole (PRILOSEC OTC) 20 MG tablet Take 20 mg by mouth daily.    Marland Kitchen oxyCODONE-acetaminophen (PERCOCET/ROXICET) 5-325 MG tablet Take 1 tablet by mouth every 4 (four) hours as needed for moderate pain or severe pain.    Marland Kitchen aspirin 325 MG tablet Take 325 mg by mouth at bedtime.      No current facility-administered medications for this visit.      PHYSICAL EXAMINATION: ECOG PERFORMANCE STATUS: 1 - Symptomatic but completely ambulatory  Vitals:   03/04/19 1355  BP: 134/64  Pulse: 71  Resp: 18  Temp: 97.7 F (36.5 C)  SpO2: 100%   Filed Weights   03/04/19 1355  Weight: 146 lb 12.8 oz (66.6 kg)    GENERAL:alert, no distress and comfortable SKIN: no rash  EYES: sclera clear LUNGS: clear to auscultation with normal breathing effort HEART: regular rate & rhythm, no lower extremity edema ABDOMEN:abdomen soft, non-tender and normal bowel sounds. Midline incision is healing well, staples removed  Musculoskeletal:no cyanosis of digits  PSYCH: alert & oriented x 3 with fluent speech NEURO: no focal motor/sensory deficits  LABORATORY DATA:  I have reviewed the data as listed  02/23/19 per care everywhere: WBC 11.1 (H) Hgb 12.4 (L) HCT 37.8  (L) RBC 3.88 (L)  BG 207 Cr 1.3 Tbili 1.7 Alk phos 157 AST 36 ALT 53   Diagnosis 01/21/19  BILE DUCT BRUSHING (SPECIMEN 1 OF 1 COLLECTED 01/21/2019) ADENOCARCINOMA.  02/10/19 pathology  A.  Hepatic artery lymph node, biopsy: One lymph node with reactive follicular lymphoid hyperplasia, negative for malignancy (0/1).   RADIOGRAPHIC STUDIES: I have personally reviewed the radiological images as listed and agreed with the findings in the report. No results found.   CT CAP 01/20/19    ERCP impression per Dr. Benson Norway 01/21/19  - The major  papilla appeared normal. - A single localized biliary stricture was found in the middle third of the main bile duct. - The entire main bile duct and upper third of the main bile duct were dilated, secondary to a stricture. - A biliary sphincterotomy was performed. - Cells for cytology obtained  in the middle third of the main bile duct. - One plastic stent was placed into the common bile duct.  EUS 01/21/19  There was dilation in the middle third of the main bile duct and in the upper third of the main bile duct which measured up to 15 mm. There was a suggestion of a stricture in the middle third of the main bile duct. The procedure was difficult as the insufflation was not working. The stricture was noted to be in the mid CBD rather than in the hilum. The CBD at the stricture was thickened. Only a cursory examination of the upper GI tract was obtained as a result of the insufflation issues. There was no obvious mass to FNA.  ASSESSMENT & PLAN: 73 yo male with DM, CAD, HTN, HL presented with abdominal pain and weight loss  1. Extrahepatic cholangiocarcinoma  -we reviewed his medical record in detail including procedures, imaging, and pathology. Initial biopsy shows adenocarcinoma  -Due to his early stage disease he was taken to the OR by Dr. Zenia Resides at [facility], whipple was aborted due to arterial and venous involvement. He was referred to [place] to initiate chemotherapy.  -we discussed neoadjuvant chemotherapy with cisplatin and gemictabine vs more intensive regimen cisplatin, gemcitabine, and abraxane - on days 1 and 8 every 21 days.  -He is 72 and has comorbid conditions, which appear well controlled. He has limited social support. Chemotherapy may be difficult but Dr. Burr Medico feels he can tolerate the more intensive regimen -Dr. Burr Medico recommends cisplatin and gemcitabine with cycle 1, plan to add abraxane with cycle 2 if he tolerates well. He will likely need neulasta with addition of abraxane but will  hold with cycle 1.  -Chemotherapy consent: Side effects including but not limited to fatigue, nausea, vomiting, diarrhea, hair loss, neuropathy, fluid retention, renal and kidney dysfunction, neutropenic fever, need for blood transfusion, bleeding, were discussed with patient in great detail. He agrees to proceed.  -will monitor CA 19-9 - baseline 682 before stent placement, then improved to 129 -He plans to return to [facility] in August for restaging scans and f/u to reconsider surgery  -we explained if restaging scans show tumor progression, then surgery would not be offered and his disease would not be curable at that point, but still treatable. He understands the only potential cure is surgical resection  -Dr. Burr Medico discussed genetic testing; his initial biopsy was only limited sample. If he does proceed with Whipple, we would request genomic testing with guardant 360 on the surgical tissue to see if he would be a candidate for other therapeutic option down the line -He agrees to [facility] placement, will request IR to place port next week -He will do virtual chemo class  -He met with Arna Snipe, GI nurse navigator today  -He will return late next week for lab and f/u, then chemo next day  2. Weight loss -secondary to malignancy  -he presented with 20 lbs weight loss  -We reviewed nutrition support with glucerna -I placed referral to dietician   3. DM -managed by PCP Dr. Theda Sers -he has had DM for 20 years, he is compliant with regimen and knows how to adjust BG -on insulin  -BG 171 today  4. CAD, HL, HTN -followed by cardiologist Dr. Ellyn Hack -on amlodipine, atenolol, lisinopril, and statin   5. Social support -he is single, no children, he lives alone -He has a good friend who can help him and take him to appointments  -I referred him  to SW to assess needs - he may benefit from talking with Edwyna Shell about his stress about his diagnosis and treatment  PLAN: -Reviewed medical record  in detail  -IR PAC placement, chemo education class next week -Refer to dietician, SW -Lab, f/u, cisplatin and gemcitabine in 1 and 2 weeks - can break up lab/fu and chemo  -Rx: antiemetics   Orders Placed This Encounter  Procedures  . IR Fluoro Guide CV Line Right    PAC for chemotherapy    Standing Status:   Future    Standing Expiration Date:   05/03/2020    Order Specific Question:   Reason for exam:    Answer:   port needed for chemotherapy, lab draw, etc    Order Specific Question:   Preferred Imaging Location?    Answer:   [facility]  . CBC with Differential ([place] Only)    Standing Status:   Standing    Number of Occurrences:   20    Standing Expiration Date:   03/03/2020  . CMP ([place] only)    Standing Status:   Standing    Number of Occurrences:   20    Standing Expiration Date:   03/03/2020  . CA 19.9    Standing Status:   Standing    Number of Occurrences:   20    Standing Expiration Date:   03/03/2020  . Ambulatory referral to Nutrition and Diabetic E    Referral Priority:   Urgent    Referral Type:   Consultation    Referral Reason:   Specialty Services Required    Number of Visits Requested:   1  . Ambulatory referral to Social Work    Referral Priority:   Routine    Referral Type:   Consultation    Referral Reason:   Specialty Services Required    Number of Visits Requested:   1    All questions were answered. The patient knows to call the clinic with any problems, questions or concerns.     Alla Feeling, NP 03/04/2019 4:12 PM  Addendum  I have seen the patient, examined him. I agree with the assessment and and plan and have edited the notes.   I have reviewed his images, biopsy results and outside medial records in detail, and discussed with pt. Unfortunately her extrahepatic cholangiocarcinoma was not resectable due to the vascular invasion.  I agree with chemotherapy, possible neoadjuvant setting. We discussed surgery is the only  option to cure his cancer and chemo alone is palliative to prolong his life. We discussed chemo options of cisplatin and gemcitabine, or adding Abraxane to the regimen based on a phase 2 data.  Potential benefit and side effects reviewed with patient, chemo consent was obtained.  Plan to start first cycle with cisplatin and gemcitabine, and add Abraxane to second cycle if he tolerates well.  We will arrange port placement next week, and started treatment next week.  He will follow-up at the [facility] after 3 months of chemo with restaging scan.  [name]  03/06/2019

## 2019-03-05 ENCOUNTER — Telehealth: Payer: Self-pay | Admitting: *Deleted

## 2019-03-05 ENCOUNTER — Telehealth: Payer: Self-pay

## 2019-03-05 ENCOUNTER — Telehealth: Payer: Self-pay | Admitting: Nurse Practitioner

## 2019-03-05 ENCOUNTER — Encounter: Payer: Self-pay | Admitting: Nurse Practitioner

## 2019-03-05 ENCOUNTER — Other Ambulatory Visit: Payer: Self-pay | Admitting: Radiology

## 2019-03-05 NOTE — Telephone Encounter (Signed)
Spoke with pt advised next available appointment for port placement will be on 6/15 @8 :00am @ [facility], NPO after midnight and to have a driver. Pt verbalized understanding.

## 2019-03-05 NOTE — Telephone Encounter (Signed)
-----   Message from Alla Feeling, NP sent at 03/04/2019  4:24 PM EDT ----- Hi team,  I put in orders for port placement, please call IR to schedule early next week to start chemo 6/12.  Thanks, Regan Rakers

## 2019-03-05 NOTE — Telephone Encounter (Signed)
Left msg for pt to verify telephone visit for pre reg

## 2019-03-05 NOTE — Telephone Encounter (Signed)
-----   Message from [name], MD sent at 03/05/2019  2:53 PM EDT ----- Regarding: RE: I spoke with Ebony Hail this morning, the port will be put in 6/8, and first chemo 6/11.   Krista Blue  ----- Message ----- From: Alla Feeling, NP Sent: 03/05/2019   2:34 PM EDT To: [name], MD, Loma Messing, LPN Subject: RE:                                            T, Can you try [facility] or [facility], we'd like to get done a little sooner. But if absolutely nothing available we could do first cycle peripherally. Or Maybe PICC? Dr Burr Medico can weigh in.  Thanks, Regan Rakers ----- Message ----- From: Loma Messing, LPN Sent: 4/0/3524  81:85 AM EDT To: Alla Feeling, NP  Next available appt is on 6/15 @ 8:00am. Pt aware ----- Message ----- From: Alla Feeling, NP Sent: 03/04/2019   4:24 PM EDT To: Jonnie Finner, RN, Dawn Virgina Evener, RN, #  Hi team,  I put in orders for port placement, please call IR to schedule early next week to start chemo 6/12.  Thanks, Regan Rakers

## 2019-03-05 NOTE — Telephone Encounter (Signed)
Spoke with pt. Pt aware of change made to port placement date. Date changed to 6/8. Pt verbalized understanding.

## 2019-03-05 NOTE — Telephone Encounter (Signed)
Returned patient's call regarding PAC placement conflicting with nutrition consult and chemo education apt. Scheduling message sent to reschedule chemo ed and nutrition apts  to 6/9.

## 2019-03-05 NOTE — Telephone Encounter (Signed)
Scheduled appt per 6/4 los.  Spoke with patient and patient aware of his appt date and time.  He stated he was worried that he might not be able to get his first treatment on 6/12 due to him not having his port which is scheduled for 6/15. Patient stated he has no veins, due to prior surgeries he had.  The patient stated that IR said they were going to contact [facility] and let them know and if someone cancel they will reschedule his port to an earlier day before he suppose to start his treatment on 6/12.

## 2019-03-08 ENCOUNTER — Inpatient Hospital Stay: Payer: Medicare Other | Admitting: Nutrition

## 2019-03-08 ENCOUNTER — Other Ambulatory Visit: Payer: Self-pay

## 2019-03-08 ENCOUNTER — Encounter: Payer: Medicare Other | Admitting: Nutrition

## 2019-03-08 ENCOUNTER — Ambulatory Visit (HOSPITAL_COMMUNITY)
Admission: RE | Admit: 2019-03-08 | Discharge: 2019-03-08 | Disposition: A | Discharge status: Home / Self Care | Payer: Medicare Other | Source: Ambulatory Visit | Attending: Nurse Practitioner | Admitting: Nurse Practitioner

## 2019-03-08 ENCOUNTER — Telehealth: Payer: Self-pay | Admitting: *Deleted

## 2019-03-08 ENCOUNTER — Telehealth: Payer: Self-pay | Admitting: Nutrition

## 2019-03-08 ENCOUNTER — Inpatient Hospital Stay: Payer: Medicare Other

## 2019-03-08 ENCOUNTER — Other Ambulatory Visit: Payer: Self-pay | Admitting: Hematology

## 2019-03-08 ENCOUNTER — Encounter (HOSPITAL_COMMUNITY): Payer: Self-pay

## 2019-03-08 DIAGNOSIS — E119 Type 2 diabetes mellitus without complications: Secondary | ICD-10-CM | POA: Insufficient documentation

## 2019-03-08 DIAGNOSIS — E785 Hyperlipidemia, unspecified: Secondary | ICD-10-CM | POA: Insufficient documentation

## 2019-03-08 DIAGNOSIS — N529 Male erectile dysfunction, unspecified: Secondary | ICD-10-CM | POA: Diagnosis not present

## 2019-03-08 DIAGNOSIS — Z794 Long term (current) use of insulin: Secondary | ICD-10-CM | POA: Insufficient documentation

## 2019-03-08 DIAGNOSIS — C221 Intrahepatic bile duct carcinoma: Secondary | ICD-10-CM | POA: Insufficient documentation

## 2019-03-08 DIAGNOSIS — B181 Chronic viral hepatitis B without delta-agent: Secondary | ICD-10-CM | POA: Insufficient documentation

## 2019-03-08 DIAGNOSIS — Z9689 Presence of other specified functional implants: Secondary | ICD-10-CM | POA: Insufficient documentation

## 2019-03-08 DIAGNOSIS — Z833 Family history of diabetes mellitus: Secondary | ICD-10-CM | POA: Insufficient documentation

## 2019-03-08 DIAGNOSIS — Z79899 Other long term (current) drug therapy: Secondary | ICD-10-CM | POA: Diagnosis not present

## 2019-03-08 DIAGNOSIS — Z7982 Long term (current) use of aspirin: Secondary | ICD-10-CM | POA: Diagnosis not present

## 2019-03-08 DIAGNOSIS — I10 Essential (primary) hypertension: Secondary | ICD-10-CM | POA: Diagnosis not present

## 2019-03-08 DIAGNOSIS — Z452 Encounter for adjustment and management of vascular access device: Secondary | ICD-10-CM | POA: Diagnosis not present

## 2019-03-08 DIAGNOSIS — Z7189 Other specified counseling: Secondary | ICD-10-CM | POA: Insufficient documentation

## 2019-03-08 HISTORY — PX: IR IMAGING GUIDED PORT INSERTION: IMG5740

## 2019-03-08 LAB — CBC WITH DIFFERENTIAL/PLATELET
Abs Immature Granulocytes: 0.04 10*3/uL (ref 0.00–0.07)
Basophils Absolute: 0.1 10*3/uL (ref 0.0–0.1)
Basophils Relative: 1 %
Eosinophils Absolute: 0.4 10*3/uL (ref 0.0–0.5)
Eosinophils Relative: 3 %
HCT: 40.2 % (ref 39.0–52.0)
Hemoglobin: 13.1 g/dL (ref 13.0–17.0)
Immature Granulocytes: 0 %
Lymphocytes Relative: 29 %
Lymphs Abs: 3.8 10*3/uL (ref 0.7–4.0)
MCH: 31.3 pg (ref 26.0–34.0)
MCHC: 32.6 g/dL (ref 30.0–36.0)
MCV: 95.9 fL (ref 80.0–100.0)
Monocytes Absolute: 1.5 10*3/uL — ABNORMAL HIGH (ref 0.1–1.0)
Monocytes Relative: 12 %
Neutro Abs: 7.1 10*3/uL (ref 1.7–7.7)
Neutrophils Relative %: 55 %
Platelets: 369 10*3/uL (ref 150–400)
RBC: 4.19 MIL/uL — ABNORMAL LOW (ref 4.22–5.81)
RDW: 13.1 % (ref 11.5–15.5)
WBC: 12.8 10*3/uL — ABNORMAL HIGH (ref 4.0–10.5)
nRBC: 0 % (ref 0.0–0.2)

## 2019-03-08 LAB — BASIC METABOLIC PANEL
Anion gap: 12 (ref 5–15)
BUN: 12 mg/dL (ref 8–23)
CO2: 24 mmol/L (ref 22–32)
Calcium: 9.5 mg/dL (ref 8.9–10.3)
Chloride: 104 mmol/L (ref 98–111)
Creatinine, Ser: 1.1 mg/dL (ref 0.61–1.24)
GFR calc Af Amer: >60 mL/min (ref >60)
GFR calc non Af Amer: >60 mL/min (ref >60)
Glucose, Bld: 150 mg/dL — ABNORMAL HIGH (ref 70–99)
Potassium: 3.4 mmol/L — ABNORMAL LOW (ref 3.5–5.1)
Sodium: 140 mmol/L (ref 135–145)

## 2019-03-08 LAB — PROTIME-INR
INR: 1 (ref 0.8–1.2)
Prothrombin Time: 13.1 seconds (ref 11.4–15.2)

## 2019-03-08 LAB — GLUCOSE, CAPILLARY: Glucose-Capillary: 143 mg/dL — ABNORMAL HIGH (ref 70–99)

## 2019-03-08 MED ORDER — CEFAZOLIN SODIUM-DEXTROSE 2-4 GM/100ML-% IV SOLN
2.0000 g | INTRAVENOUS | Status: AC
  Administered 2019-03-08: 15:00:00 2 g via INTRAVENOUS

## 2019-03-08 MED ORDER — PROCHLORPERAZINE MALEATE 10 MG PO TABS: 10.0000 mg | ORAL_TABLET | Freq: Four times a day (QID) | ORAL | 1 refills | Status: DC | PRN

## 2019-03-08 MED ORDER — HEPARIN SOD (PORK) LOCK FLUSH 100 UNIT/ML IV SOLN
INTRAVENOUS | Status: AC
  Filled 2019-03-08: qty 5

## 2019-03-08 MED ORDER — MIDAZOLAM HCL 2 MG/2ML IJ SOLN
INTRAMUSCULAR | Status: AC
  Filled 2019-03-08: qty 4

## 2019-03-08 MED ORDER — LIDOCAINE HCL (PF) 1 % IJ SOLN
INTRAMUSCULAR | Status: AC | PRN
  Administered 2019-03-08 (×2): 10 mL

## 2019-03-08 MED ORDER — HEPARIN SOD (PORK) LOCK FLUSH 100 UNIT/ML IV SOLN
INTRAVENOUS | Status: AC | PRN
  Administered 2019-03-08: 500 [IU] via INTRAVENOUS

## 2019-03-08 MED ORDER — MIDAZOLAM HCL 2 MG/2ML IJ SOLN
INTRAMUSCULAR | Status: AC | PRN
  Administered 2019-03-08 (×2): 1 mg via INTRAVENOUS

## 2019-03-08 MED ORDER — ONDANSETRON HCL 8 MG PO TABS: 8.0000 mg | ORAL_TABLET | Freq: Two times a day (BID) | ORAL | 1 refills | Status: DC | PRN

## 2019-03-08 MED ORDER — CEFAZOLIN SODIUM-DEXTROSE 2-4 GM/100ML-% IV SOLN
INTRAVENOUS | Status: AC
  Administered 2019-03-08: 15:00:00 2 g via INTRAVENOUS
  Filled 2019-03-08: qty 100

## 2019-03-08 MED ORDER — SODIUM CHLORIDE 0.9 % IV SOLN
INTRAVENOUS | Status: DC
  Administered 2019-03-08: 13:00:00 via INTRAVENOUS

## 2019-03-08 MED ORDER — LIDOCAINE HCL 1 % IJ SOLN
INTRAMUSCULAR | Status: AC
  Filled 2019-03-08: qty 20

## 2019-03-08 MED ORDER — FENTANYL CITRATE (PF) 100 MCG/2ML IJ SOLN
INTRAMUSCULAR | Status: AC | PRN
  Administered 2019-03-08 (×2): 50 ug via INTRAVENOUS

## 2019-03-08 MED ORDER — LIDOCAINE-PRILOCAINE 2.5-2.5 % EX CREA: TOPICAL_CREAM | CUTANEOUS | 3 refills | Status: DC

## 2019-03-08 NOTE — Progress Notes (Signed)
START OFF PATHWAY REGIMEN - Other   [id number]:Cisplatin 25 mg/m2 D1,8 + Gemcitabine 1,000 mg/m2 D1,8 q21 Days:   A cycle is every 21 days:     Gemcitabine      Cisplatin   **Always confirm dose/schedule in your pharmacy ordering system**  Patient Characteristics: Intent of Therapy: Non-Curative / Palliative Intent, Discussed with Patient 

## 2019-03-08 NOTE — Telephone Encounter (Signed)
Left message for pt to verify for telephone visit for pre reg

## 2019-03-08 NOTE — Procedures (Signed)
Interventional Radiology Procedure Note  Procedure: Single Lumen Power Port Placement    Access:  Right IJ vein.  Findings: Catheter tip positioned at SVC/RA junction. Port is ready for immediate use.   Complications: None  EBL: < 10 mL  Recommendations:  - Ok to shower in 24 hours - Do not submerge for 7 days - Routine line care   Emrie Gayle T. Rithika Seel, M.D Pager:  [phone]   

## 2019-03-08 NOTE — Consult Note (Signed)
Chief Complaint: Patient was seen in consultation today for Port-A-Cath placement  Referring Physician(s): Feng,Y  Supervising Physician: Aletta Edouard  Patient Status: [facility] - Out-pt  History of Present Illness: Christopher Welch is a 73 y.o. male with history of recently diagnosed cholangiocarcinoma.  He was taken to the OR at [facility] on 02/10/2019 for pancreaticoduodenectomy but procedure was aborted due to arterial and venous involvement.  He presents today for Port-A-Cath placement for chemotherapy.  Past Medical History:  Diagnosis Date  . Diabetes mellitus type I, controlled ([place])   . Dyslipidemia   . ED (erectile dysfunction)   . Essential hypertension   . Gilbert's disease   . Hepatitis B carrier [facility])     Past Surgical History:  Procedure Laterality Date  . APPENDECTOMY  1964  . BILIARY BRUSHING  01/21/2019   Procedure: BILIARY BRUSHING;  Surgeon: Carol Ada, MD;  Location: [facility] ENDOSCOPY;  Service: Endoscopy;;  . BILIARY STENT PLACEMENT N/A 01/21/2019   Procedure: BILIARY STENT PLACEMENT;  Surgeon: Carol Ada, MD;  Location: [facility] ENDOSCOPY;  Service: Endoscopy;  Laterality: N/A;  . ENDOSCOPIC RETROGRADE CHOLANGIOPANCREATOGRAPHY (ERCP) WITH PROPOFOL N/A 01/21/2019   Procedure: ENDOSCOPIC RETROGRADE CHOLANGIOPANCREATOGRAPHY (ERCP) WITH PROPOFOL;  Surgeon: Carol Ada, MD;  Location: [facility]OS[facility];  Service: Endoscopy;  Laterality: N/A;  . ESOPHAGOGASTRODUODENOSCOPY (EGD) WITH PROPOFOL N/A 01/21/2019   Procedure: ESOPHAGOGASTRODUODENOSCOPY (EGD) WITH PROPOFOL;  Surgeon: Carol Ada, MD;  Location: [facility];  Service: Endoscopy;  Laterality: N/A;  . FEMORAL HERNIA REPAIR    . LEFT HEART CATH AND CORONARY ANGIOGRAPHY N/A 05/19/2017   Procedure: LEFT HEART CATH AND CORONARY ANGIOGRAPHY;  Surgeon: Leonie Man, MD;  Location: [place] [facility];  Service: Cardiovascular;  Laterality: N/A;  . SPHINCTEROTOMY  01/21/2019   Procedure: SPHINCTEROTOMY;  Surgeon: Carol Ada, MD;  Location: [facility];  Service: Endoscopy;;  . UPPER ESOPHAGEAL ENDOSCOPIC ULTRASOUND (EUS) N/A 01/21/2019   Procedure: UPPER ESOPHAGEAL ENDOSCOPIC ULTRASOUND (EUS);  Surgeon: Carol Ada, MD;  Location: [facility];  Service: Endoscopy;  Laterality: N/A;    Allergies: Patient has no known allergies.  Medications: Prior to Admission medications   Medication Sig Start Date End Date Taking? Authorizing Provider  amLODipine (NORVASC) 5 MG tablet Take 5 mg by mouth daily.     [provider]  aspirin 325 MG tablet Take 325 mg by mouth at bedtime.     [provider]  atenolol (TENORMIN) 25 MG tablet Take by mouth daily. Once a day    [provider]  atorvastatin (LIPITOR) 10 MG tablet Take 10 mg by mouth daily. 05/13/17   [provider]  Blood Glucose Monitoring Suppl (ACCU-CHEK NANO SMARTVIEW) W/DEVICE KIT 1 kit by Does not apply route 2 (two) times daily. Patient taking differently: 1 kit by Does not apply route See admin instructions. Test blood sugars 12x's daily 10/23/12   Denita Lung, MD  DiphenhydrAMINE HCl (ZZZQUIL) 50 MG/30ML LIQD Take 60 mLs by mouth at bedtime.    [provider]  glucose blood test strip Test 3 times a day. Patient taking differently: 1 each by Other route See admin instructions. Test 12 times a day. 10/26/12   Denita Lung, MD  insulin NPH Human (NOVOLIN N) 100 UNIT/ML injection Inject 10 Units into the skin 2 (two) times a day.    [provider]  insulin regular (NOVOLIN R) 100 units/mL injection Inject 10 Units into the skin 3 (three) times daily before meals.    [provider]  lidocaine-prilocaine (EMLA) cream Apply to affected area once 03/08/19   [name], MD  lisinopril (ZESTRIL) 20 MG tablet Take 20 mg by mouth daily.  03/14/17   [provider]  metFORMIN (GLUCOPHAGE) 1000 MG tablet Take 2,000 mg by mouth every evening.     [provider]  Niacin CR  (SLO-NIACIN) 750 MG TBCR Take 1,500 mg by mouth at bedtime.    [provider]  omeprazole (PRILOSEC OTC) 20 MG tablet Take 20 mg by mouth daily.    [provider]  ondansetron (ZOFRAN) 8 MG tablet Take 1 tablet (8 mg total) by mouth 2 (two) times daily as needed. Start on the third day after chemotherapy. 03/08/19   [name], MD  oxyCODONE-acetaminophen (PERCOCET/ROXICET) 5-325 MG tablet Take 1 tablet by mouth every 4 (four) hours as needed for moderate pain or severe pain.    [provider]  prochlorperazine (COMPAZINE) 10 MG tablet Take 1 tablet (10 mg total) by mouth every 6 (six) hours as needed (Nausea or vomiting). 03/08/19   [name], MD  citalopram (CELEXA) 20 MG tablet Take 1 tablet (20 mg total) by mouth daily. 02/26/11 12/05/11  Denita Lung, MD  clonazePAM (KLONOPIN) 0.5 MG tablet Take 0.5 mg by mouth 2 (two) times daily as needed.    12/05/11  [provider]  lamoTRIgine (LAMICTAL) 100 MG tablet Take 100 mg by mouth daily. 1/2 TABLET QD   12/05/11  [provider]  zolpidem (AMBIEN) 10 MG tablet Take 10 mg by mouth at bedtime as needed.    12/05/11  [provider]     Family History  Problem Relation Age of Onset  . Diabetes Father     Social History   Socioeconomic History  . Marital status: Single    Spouse name: Not on file  . Number of children: Not on file  . Years of education: Not on file  . Highest education level: Not on file  Occupational History  . Not on file  Social Needs  . Financial resource strain: Not on file  . Food insecurity:    Worry: Not on file    Inability: Not on file  . Transportation needs:    Medical: Not on file    Non-medical: Not on file  Tobacco Use  . Smoking status: Never Smoker  . Smokeless tobacco: Never Used  Substance and Sexual Activity  . Alcohol use: Yes    Alcohol/week: 7.0 standard drinks    Types: 7 Glasses of wine per week  . Drug use: No  . Sexual activity: Yes   Lifestyle  . Physical activity:    Days per week: Not on file    Minutes per session: Not on file  . Stress: Not on file  Relationships  . Social connections:    Talks on phone: Not on file    Gets together: Not on file    Attends religious service: Not on file    Active member of club or organization: Not on file    Attends meetings of clubs or organizations: Not on file    Relationship status: Not on file  Other Topics Concern  . Not on file  Social History Narrative   Lives alone in a one story home - "partner" No children.    .  Works part time at [employer].   Education: masters in education   Quit smoking in 1984. Did have passive smoke exposure prior to that  with his father.   Drains 5 glasses of wine per week. 2 cups of coffee daily.   Works out at [employer] regularly - 5 days per week.     Review of Systems denies fever, headache, chest pain, dyspnea, cough, nausea, vomiting or bleeding.  He does have intermittent abdominal and back discomfort.  Vital Signs: BP (!) 146/81   Pulse 69   Temp 97.9 F (36.6 C) (Oral)   Resp 18   SpO2 100%   Physical Exam awake, alert.  Chest clear to auscultation bilaterally.  Heart with regular rate and rhythm.  Abdomen soft, positive bowel sounds, clean, vertical midline wound with no significant erythema or drainage;  Ext with full range of motion.  Imaging: No results found.  Labs:  CBC: No results for input(s): WBC, HGB, HCT, PLT in the last 8760 hours.  COAGS: No results for input(s): INR, APTT in the last 8760 hours.  BMP: No results for input(s): NA, K, CL, CO2, GLUCOSE, BUN, CALCIUM, CREATININE, GFRNONAA, GFRAA in the last 8760 hours.  Invalid input(s): CMP  LIVER FUNCTION TESTS: No results for input(s): BILITOT, AST, ALT, ALKPHOS, PROT, ALBUMIN in the last 8760 hours.  TUMOR MARKERS: No results for input(s): AFPTM, CEA, CA199, CHROMGRNA in the last 8760 hours.  Assessment and Plan: 73 y.o. male with history  of recently diagnosed cholangiocarcinoma.  He was taken to the OR at [facility] on 02/10/2019 for pancreaticoduodenectomy but procedure was aborted due to arterial and venous involvement.  He presents today for Port-A-Cath placement for chemotherapy.Risks and benefits of image guided port-a-catheter placement was discussed with the patient including, but not limited to bleeding, infection, pneumothorax, or fibrin sheath development and need for additional procedures.  All of the patient's questions were answered, patient is agreeable to proceed. Consent signed and in chart.  LABS PENDING   Thank you for this interesting consult.  I greatly enjoyed meeting DELMER KOWALSKI and look forward to participating in their care.  A copy of this report was sent to the requesting provider on this date.  Electronically Signed: D. Rowe Robert, PA-C 03/08/2019, 12:16 PM   I spent a total of 25 minutes in face to face in clinical consultation, greater than 50% of which was counseling/coordinating care for Port-A-Cath placement

## 2019-03-08 NOTE — Telephone Encounter (Signed)
Left for patient to verify for telephone visit for pre reg

## 2019-03-08 NOTE — Discharge Instructions (Signed)
Moderate Conscious Sedation, Adult, Care After °These instructions provide you with information about caring for yourself after your procedure. Your health care provider may also give you more specific instructions. Your treatment has been planned according to current medical practices, but problems sometimes occur. Call your health care provider if you have any problems or questions after your procedure. °What can I expect after the procedure? °After your procedure, it is common: °· To feel sleepy for several hours. °· To feel clumsy and have poor balance for several hours. °· To have poor judgment for several hours. °· To vomit if you eat too soon. °Follow these instructions at home: °For at least 24 hours after the procedure: ° °· Do not: °? Participate in activities where you could fall or become injured. °? Drive. °? Use heavy machinery. °? Drink alcohol. °? Take sleeping pills or medicines that cause drowsiness. °? Make important decisions or sign legal documents. °? Take care of children on your own. °· Rest. °Eating and drinking °· Follow the diet recommended by your health care provider. °· If you vomit: °? Drink water, juice, or soup when you can drink without vomiting. °? Make sure you have little or no nausea before eating solid foods. °General instructions °· Have a responsible adult stay with you until you are awake and alert. °· Take over-the-counter and prescription medicines only as told by your health care provider. °· If you smoke, do not smoke without supervision. °· Keep all follow-up visits as told by your health care provider. This is important. °Contact a health care provider if: °· You keep feeling nauseous or you keep vomiting. °· You feel light-headed. °· You develop a rash. °· You have a fever. °Get help right away if: °· You have trouble breathing. °This information is not intended to replace advice given to you by your health care provider. Make sure you discuss any questions you have  with your health care provider. °Document Released: 07/07/2013 Document Revised: 02/19/2016 Document Reviewed: 01/06/2016 °Elsevier Interactive Patient Education © 2019 [employer] ° ° °Implanted Port Insertion, Care After °This sheet gives you information about how to care for yourself after your procedure. Your health care provider may also give you more specific instructions. If you have problems or questions, contact your health care provider. °What can I expect after the procedure? °After the procedure, it is common to have: °· Discomfort at the port insertion site. °· Bruising on the skin over the port. This should improve over 3-4 days. °Follow these instructions at home: °Port care °· After your port is placed, you will get a manufacturer's information card. The card has information about your port. Keep this card with you at all times. °· Take care of the port as told by your health care provider. Ask your health care provider if you or a family member can get training for taking care of the port at home. A home health care nurse may also take care of the port. °· Make sure to remember what type of port you have. °Incision care ° °  ° °· Follow instructions from your health care provider about how to take care of your port insertion site. Make sure you: °? Wash your hands with soap and water before and after you change your bandage (dressing). If soap and water are not available, use hand sanitizer. °? Change your dressing as told by your health care provider.  You may remove your dressing tomorrow. °? Leave stitches (sutures), skin   glue, or adhesive strips in place. These skin closures may need to stay in place for 2 weeks or longer. If adhesive strip edges start to loosen and curl up, you may trim the loose edges. Do not remove adhesive strips completely unless your health care provider tells you to do that.  DO NOT use EMLA cream for 2 weeks after port placement as this cream will remove surgical glue  on your incision. °· Check your port insertion site every day for signs of infection. Check for: °? Redness, swelling, or pain. °? Fluid or blood. °? Warmth. °? Pus or a bad smell. °Activity °· Return to your normal activities as told by your health care provider. Ask your health care provider what activities are safe for you. °· Do not lift anything that is heavier than 10 lb (4.5 kg), or the limit that you are told, until your health care provider says that it is safe. °General instructions °· Take over-the-counter and prescription medicines only as told by your health care provider. °· Do not take baths, swim, or use a hot tub until your health care provider approves. Ask your health care provider if you may take showers. You may only be allowed to take sponge baths.  You may shower tomorrow. °· Do not drive for 24 hours if you were given a sedative during your procedure. °· Wear a medical alert bracelet in case of an emergency. This will tell any health care providers that you have a port. °· Keep all follow-up visits as told by your health care provider. This is important. °Contact a health care provider if: °· You cannot flush your port with saline as directed, or you cannot draw blood from the port. °· You have a fever or chills. °· You have redness, swelling, or pain around your port insertion site. °· You have fluid or blood coming from your port insertion site. °· Your port insertion site feels warm to the touch. °· You have pus or a bad smell coming from the port insertion site. °Get help right away if: °· You have chest pain or shortness of breath. °· You have bleeding from your port that you cannot control. °Summary °· Take care of the port as told by your health care provider. Keep the manufacturer's information card with you at all times. °· Change your dressing as told by your health care provider. °· Contact a health care provider if you have a fever or chills or if you have redness, swelling, or  pain around your port insertion site. °· Keep all follow-up visits as told by your health care provider. °This information is not intended to replace advice given to you by your health care provider. Make sure you discuss any questions you have with your health care provider. °Document Released: 07/07/2013 Document Revised: 04/14/2018 Document Reviewed: 04/14/2018 °Elsevier Interactive Patient Education © 2019 Else[employer] Inc. ° °

## 2019-03-09 ENCOUNTER — Inpatient Hospital Stay: Payer: Medicare Other | Admitting: Nutrition

## 2019-03-09 ENCOUNTER — Inpatient Hospital Stay: Payer: Medicare Other

## 2019-03-09 ENCOUNTER — Telehealth: Payer: Self-pay

## 2019-03-09 NOTE — Telephone Encounter (Signed)
Patient called to touch base and to discuss how his PAC placement went. I reviewed his chemo treatment plan with him and we talked about what his first day of chemo would look like. Patient has a proclivity towards anxiety. I have provided Christopher Welch with LCSW contact number for support. 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 I encouraged him to call me with questions or concerns.

## 2019-03-09 NOTE — Progress Notes (Signed)
RD working remotely.  73 year old male diagnosed with Cholangiocarcinoma receiving cisplatin and gemcitabine.  PMH includes CAD, DM, HL, HTN, Gilbert's disease, Tobacco and ETOH.  Medications include Lipitor, Insulin, Glucophage, Niacin, and Prilosec.  Labs include K 3.4 and Glucose 150.  Height: 68 inches. Weight: 146.8 pounds. UBW: 160 pounds. BMI: 22.32.  Patient reports continuous stomach pain.  He has been seen by several GI doctors but they have not been able to tell him why he hurts all the time. He has not been able to eat much solid food and tolerates very small sips of liquids such as water, skim milk, gingerale. He can drink sips of a protein drink. He can eat a little soup and small bites of mashed potatoes. If he eats more, he vomits. He has lost 8% of his UBW. He has seen several GI doctors with no change in his pain. He takes a stool softener but has not had a bowel movement in 5 days.  Nutrition Diagnosis: Inadequate oral intake related to new cancer diagnosis as evidenced by patient's diet history and weight loss.  Intervention: Educated patient to try to increase oral intake and focus on calories and protein. Asked him to monitor difference in pain level with oral intake. Encouraged he try oral nutrition supplements and provided a variety of examples. Will give patient samples to try when he comes into the cancer center on Thursday. Patient may need nutrition support if he continues unable to tolerate oral intake. Questions answered and teach back method used.  Monitoring, Evaluation, Goals: Patient will tolerate increased calories and protein to minimize weight loss.  Next Visit: Thursday, June 11.

## 2019-03-10 ENCOUNTER — Ambulatory Visit (HOSPITAL_COMMUNITY): Payer: Medicare Other

## 2019-03-10 NOTE — Progress Notes (Signed)
°[facility]   °[phone] [phone]   °Clinic Follow up Note  ° °Patient Care Team: °Collins, Dana, DO as PCP - General (Family Medicine) °03/11/2019 ° °CHIEF COMPLAINT: f/u extrahepatic cholangiocarcinoma  ° °SUMMARY OF ONCOLOGIC HISTORY: from initial consult note 03/04/2019  °Christopher Welch 73 y.o. male is here because of cholangiocarcinoma previously seen by oncologist Dr. Morse at [facility] for epigastric pain and weight loss. LFTs per intake note from 12/2018 AST 285, ALT 378, AP 276, Tbili 8.2.  Abdominal US on 01/14/19 showed gallbladder sludge and significant intrahepatic and extrahepatic biliary dilatation concerning for distal CBD obstruction. He underwent MRI/MRCP on 01/20/19 showing mid CBD stricture, moderate pancreatic ductal dilatation. No definite mass. On 01/21/19 he proceeded with EUS/ERCP which showed biliary stricture in the middle third of the main bile duct. Plastic biliary stent was placed. Bile duct brushing confirmed adenocarcinoma. CA 19-9 on 01/21/19 was 682, came down to 129 on 02/02/19 at [facility] after stent placement. Normal CEA. Staging CT on 02/03/19 showed 1.6 cm area of abnormal soft tissue abutting the distal common hepatic artery, right hepatic artery and the portal vein; there was periportal adenopathy concerning for nodal metastasis. Chest was negative. He was taken to the OR on 02/10/19 for pancreaticoduodenectomy per Dr. Allen which was aborted due to arterial and venous involvement. Biopsy of the hepatic artery lymph node showed reactive follicular lymphoid hyperplasia, negative for malignancy. Post-op he developed chills and dark emesis, he returned to Dr. Morse on 02/23/19. ERCP on 02/23/19 which showed severe mid bile duct malignant-appearing stricture. A metal stent was placed. I do not have that report. He was referred to our clinic closer to his home to initiate chemotherapy.  ° °CURRENT THERAPY: PENDING neoadjuvant cisplatin and gemcitabine with cycle  1, plan to add abraxane from cycle 2 if he tolerates well; on days 1 and 8 every 21 days. To start 03/12/19  ° °INTERVAL HISTORY: Christopher Welch returns for follow up and cycle 1 chemo as scheduled. Since last visit he attended chemo education class, spoke with dietician, and underwent PAC placement. Today he feels well. He is pleased with the care he's received thus far. Port insertion went well. He has chemo education class and dietician later today. He picked up anti-emetics. He began carnation breakfast supplement. Has some mild abdominal discomfort when he eats and at night. Takes oxycodone once daily in evening PRN. Incision is less tender. Has normal BM with meds. Denies n/v/d. Denies blood in stool. His energy is up, he worked in the yard this week. Denies fever, chills, cough, chest pain, dyspnea, or leg edema. He feels prepared for chemo.  ° ° °MEDICAL HISTORY:  °Past Medical History:  °Diagnosis Date  °• Diabetes mellitus type I, controlled (HCC)   °• Dyslipidemia   °• ED (erectile dysfunction)   °• Essential hypertension   °• Gilbert's disease   °• Hepatitis B carrier (HCC)   ° ° °SURGICAL HISTORY: °Past Surgical History:  °Procedure Laterality Date  °• APPENDECTOMY  1964  °• BILIARY BRUSHING  01/21/2019  ° Procedure: BILIARY BRUSHING;  Surgeon: Hung, Patrick, MD;  Location: [facility];  Service: Endoscopy;;  °• BILIARY STENT PLACEMENT N/A 01/21/2019  ° Procedure: BILIARY STENT PLACEMENT;  Surgeon: Hung, Patrick, MD;  Location: [facility];  Service: Endoscopy;  Laterality: N/A;  °• ENDOSCOPIC RETROGRADE CHOLANGIOPANCREATOGRAPHY (ERCP) WITH PROPOFOL N/A 01/21/2019  ° Procedure: ENDOSCOPIC RETROGRADE CHOLANGIOPANCREATOGRAPHY (ERCP) WITH PROPOFOL;  Surgeon: Hung, Patrick, MD;  Location: [facility];    Service: Endoscopy;  Laterality: N/A;   ESOPHAGOGASTRODUODENOSCOPY (EGD) WITH PROPOFOL N/A 01/21/2019   Procedure: ESOPHAGOGASTRODUODENOSCOPY (EGD) WITH PROPOFOL;  Surgeon: Carol Ada, MD;  Location: [facility];  Service: Endoscopy;  Laterality: N/A;   FEMORAL HERNIA REPAIR     IR IMAGING GUIDED PORT INSERTION  03/08/2019   LEFT HEART CATH AND CORONARY ANGIOGRAPHY N/A 05/19/2017   Procedure: LEFT HEART CATH AND CORONARY ANGIOGRAPHY;  Surgeon: Leonie Man, MD;  Location: [facility];  Service: Cardiovascular;  Laterality: N/A;   SPHINCTEROTOMY  01/21/2019   Procedure: SPHINCTEROTOMY;  Surgeon: Carol Ada, MD;  Location: [facility];  Service: Endoscopy;;   UPPER ESOPHAGEAL ENDOSCOPIC ULTRASOUND (EUS) N/A 01/21/2019   Procedure: UPPER ESOPHAGEAL ENDOSCOPIC ULTRASOUND (EUS);  Surgeon: Carol Ada, MD;  Location: [facility];  Service: Endoscopy;  Laterality: N/A;    I have reviewed the social history and family history with the patient and they are unchanged from previous note.  ALLERGIES:  is allergic to erythromycin.  MEDICATIONS:  Current Outpatient Medications  Medication Sig Dispense Refill   amLODipine (NORVASC) 5 MG tablet Take 5 mg by mouth daily.      atenolol (TENORMIN) 25 MG tablet Take by mouth daily. Once a day     atorvastatin (LIPITOR) 10 MG tablet Take 10 mg by mouth daily.     Blood Glucose Monitoring Suppl (ACCU-CHEK NANO SMARTVIEW) W/DEVICE KIT 1 kit by Does not apply route 2 (two) times daily. (Patient taking differently: 1 kit by Does not apply route See admin instructions. Test blood sugars 12x's daily) 1 kit 0   DiphenhydrAMINE HCl (ZZZQUIL) 50 MG/30ML LIQD Take 60 mLs by mouth at bedtime.     glucose blood test strip Test 3 times a day. (Patient taking differently: 1 each by Other route See admin instructions. Test 12 times a day.) 300 each Prn   insulin NPH Human (NOVOLIN N) 100 UNIT/ML injection Inject 10 Units into the skin 2 (two) times a day.     insulin regular (NOVOLIN R) 100 units/mL injection Inject 10 Units into the skin 3 (three) times daily before meals.     lidocaine-prilocaine (EMLA) cream Apply to affected area once 30 g 3     lisinopril (ZESTRIL) 20 MG tablet Take 20 mg by mouth daily.      metFORMIN (GLUCOPHAGE) 1000 MG tablet Take 2,000 mg by mouth every evening.      Niacin CR (SLO-NIACIN) 750 MG TBCR Take 1,500 mg by mouth at bedtime.     omeprazole (PRILOSEC OTC) 20 MG tablet Take 20 mg by mouth daily.     oxycodone (OXY-IR) 5 MG capsule Take 1 capsule (5 mg total) by mouth every 6 (six) hours as needed for pain. 30 capsule 0   prochlorperazine (COMPAZINE) 10 MG tablet Take 1 tablet (10 mg total) by mouth every 6 (six) hours as needed (Nausea or vomiting). 30 tablet 1   aspirin 325 MG tablet Take 325 mg by mouth at bedtime.      ondansetron (ZOFRAN) 8 MG tablet Take 1 tablet (8 mg total) by mouth 2 (two) times daily as needed. Start on the third day after chemotherapy. (Patient not taking: Reported on 03/11/2019) 30 tablet 1   No current facility-administered medications for this visit.     PHYSICAL EXAMINATION: ECOG PERFORMANCE STATUS: 1 - Symptomatic but completely ambulatory  Vitals:   03/11/19 1156  BP: (!) 145/82  Pulse: 75  Resp: 18  Temp: 99.1 F (37.3 C)  SpO2:  100%  ° °Filed Weights  ° 03/11/19 1156  °Weight: 147 lb 12.8 oz (67 kg)  ° ° °GENERAL:alert, no distress and comfortable °SKIN: no obvious rash  °EYES: sclera clear °LUNGS: respirations even and unlabored  °HEART:  no lower extremity edema °ABDOMEN: midline incision healing well, covered with surgical tape. No erythema or drainage °Musculoskeletal:no cyanosis of digits  °NEURO: alert & oriented x 3 with fluent speech, normal gait °PAC healing well °Limited exam for covid19 outbreak ° °LABORATORY DATA:  °I have reviewed the data as listed °CBC Latest Ref Rng & Units 03/11/2019 03/08/2019 05/14/2017  °WBC 4.0 - 10.5 K/uL 10.2 12.8(H) 5.4  °Hemoglobin 13.0 - 17.0 g/dL 12.0(L) 13.1 14.3  °Hematocrit 39.0 - 52.0 % 35.3(L) 40.2 41.4  °Platelets 150 - 400 K/uL 245 369 228  ° ° ° °CMP Latest Ref Rng & Units 03/11/2019 03/08/2019 05/19/2017  °Glucose  70 - 99 mg/dL 156(H) 150(H) 170(H)  °BUN 8 - 23 mg/dL 13 12 15  °Creatinine 0.61 - 1.24 mg/dL 1.05 1.10 1.51(H)  °Sodium 135 - 145 mmol/L 138 140 138  °Potassium 3.5 - 5.1 mmol/L 4.1 3.4(L) 4.7  °Chloride 98 - 111 mmol/L 102 104 105  °CO2 22 - 32 mmol/L 26 24 26  °Calcium 8.9 - 10.3 mg/dL 9.5 9.5 9.7  °Total Protein 6.5 - 8.1 g/dL 7.0 - -  °Total Bilirubin 0.3 - 1.2 mg/dL 1.2 - -  °Alkaline Phos 38 - 126 U/L 80 - -  °AST 15 - 41 U/L 20 - -  °ALT 0 - 44 U/L 20 - -  ° ° ° ° °RADIOGRAPHIC STUDIES: °I have personally reviewed the radiological images as listed and agreed with the findings in the report. °No results found.  ° °ASSESSMENT & PLAN: 73 yo male with DM, CAD, HTN, HL presented with abdominal pain and weight loss °  °1. Extrahepatic cholangiocarcinoma  °-diagnosed in 12/2018, he presented with abdominal pain and weight loss, work up revealed malignant stricture and CBD obstruction required stenting. Brushing revealed adenocarcinoma. His extrahepatic cholangiocarcinoma was not resectable due to vascular involvement (surgeon - Dr. Allen at [facility]) °-He was referred to our clinic to initiate chemotherapy in the neoadjuvant setting, Dr. Feng recommends gemcitabine and cisplatin for cycle 1, on days 1 and 8 every 21 days, with plan to add abraxane with cycle 2 if he tolerates well  °-Christopher Welch appears well today. He continues to recover well from attempted whipple surgery. Incision is healing well. His performance status is good.  °-labs reviewed, CBC and CMP adequate for treatment. Mg and Ca 19-9 pending. He will begin chemotherapy 03/12/19.  °-he has mild pain that is worse at night, controlled well with oxy IR 5 mg. I will refill. I reviewed his med list. He has numerous medications that can cause drowsiness, such as ambien, trazadone, nyquil, compazine, oxycodone. I instructed him not to take these together. And no to drive. He understands.  °-we reviewed nutrition, supportive meds, and signs/symptoms he should call  to report such as fever, chills, cough, chest pain, dyspnea, severe fatigue, dehydration, uncontrolled GI symptoms.  °-he will meet with chemo edu RN and dietician later today °-he will return for f/u and day 8 next week °  °2. Weight loss °-secondary to malignancy  °-he presented with 20 lbs weight loss  °-he will meet with dietician later today °-weight is stable °  °3. DM °-managed by PCP Dr. Collins °-he has had DM for 20 years, he is compliant with regimen and   knows how to adjust BG °-on insulin  °-BG 119 per his monitor during today's visit  °-I reviewed BG can be elevated during chemotherapy, especially when receiving steroids. Will monitor closely  °  °4. CAD, HL, HTN °-followed by cardiologist Dr. Harding °-on amlodipine, atenolol, lisinopril, and statin  °  °5. Social support °-he is single, no children, he lives alone °-He has a good friend who can help him and take him to appointments  °-I referred him to SW to assess needs - he may benefit from talking with Anne Cunningham about his stress about his diagnosis and treatment ° °PLAN: °-CBC, CMP reviewed; Mg and CA 19-9 pending  °-Proceed with cycle 1 day 1 cisplatin and gemcitabine on 03/12/19 °-F/u and day 8 chemo next week  °-Chemo edu and dietician today °-Refill Oxycodone 5 mg ° ° °Orders Placed This Encounter  °Procedures  °• Magnesium  °  Draw on chemo days  °  Standing Status:   Standing  °  Number of Occurrences:   20  °  Standing Expiration Date:   03/10/2020  ° °All questions were answered. The patient knows to call the clinic with any problems, questions or concerns. No barriers to learning was detected. ° °  ° Lacie K Burton, NP °03/11/19  ° ° °

## 2019-03-11 ENCOUNTER — Encounter: Payer: Self-pay | Admitting: Nurse Practitioner

## 2019-03-11 ENCOUNTER — Inpatient Hospital Stay: Payer: Medicare Other

## 2019-03-11 ENCOUNTER — Other Ambulatory Visit: Payer: Self-pay | Admitting: Hematology

## 2019-03-11 ENCOUNTER — Inpatient Hospital Stay: Payer: Medicare Other | Admitting: Nutrition

## 2019-03-11 ENCOUNTER — Inpatient Hospital Stay (HOSPITAL_BASED_OUTPATIENT_CLINIC_OR_DEPARTMENT_OTHER): Payer: Medicare Other | Admitting: Nurse Practitioner

## 2019-03-11 ENCOUNTER — Other Ambulatory Visit: Payer: Self-pay

## 2019-03-11 ENCOUNTER — Encounter: Payer: Self-pay | Admitting: General Practice

## 2019-03-11 VITALS — BP 145/82 | HR 75 | Temp 99.1°F | Resp 18 | Ht 68.0 in | Wt 147.8 lb

## 2019-03-11 DIAGNOSIS — C221 Intrahepatic bile duct carcinoma: Secondary | ICD-10-CM

## 2019-03-11 DIAGNOSIS — R634 Abnormal weight loss: Secondary | ICD-10-CM | POA: Diagnosis not present

## 2019-03-11 DIAGNOSIS — I2510 Atherosclerotic heart disease of native coronary artery without angina pectoris: Secondary | ICD-10-CM | POA: Diagnosis not present

## 2019-03-11 DIAGNOSIS — E10319 Type 1 diabetes mellitus with unspecified diabetic retinopathy without macular edema: Secondary | ICD-10-CM

## 2019-03-11 DIAGNOSIS — Z794 Long term (current) use of insulin: Secondary | ICD-10-CM

## 2019-03-11 DIAGNOSIS — Z79899 Other long term (current) drug therapy: Secondary | ICD-10-CM | POA: Diagnosis not present

## 2019-03-11 DIAGNOSIS — B181 Chronic viral hepatitis B without delta-agent: Secondary | ICD-10-CM | POA: Diagnosis not present

## 2019-03-11 DIAGNOSIS — C240 Malignant neoplasm of extrahepatic bile duct: Secondary | ICD-10-CM

## 2019-03-11 DIAGNOSIS — Z881 Allergy status to other antibiotic agents status: Secondary | ICD-10-CM

## 2019-03-11 DIAGNOSIS — M546 Pain in thoracic spine: Secondary | ICD-10-CM | POA: Diagnosis not present

## 2019-03-11 DIAGNOSIS — R197 Diarrhea, unspecified: Secondary | ICD-10-CM | POA: Diagnosis not present

## 2019-03-11 DIAGNOSIS — Z5111 Encounter for antineoplastic chemotherapy: Secondary | ICD-10-CM | POA: Diagnosis not present

## 2019-03-11 DIAGNOSIS — Z833 Family history of diabetes mellitus: Secondary | ICD-10-CM

## 2019-03-11 DIAGNOSIS — R5383 Other fatigue: Secondary | ICD-10-CM | POA: Diagnosis not present

## 2019-03-11 DIAGNOSIS — I10 Essential (primary) hypertension: Secondary | ICD-10-CM | POA: Diagnosis not present

## 2019-03-11 DIAGNOSIS — R590 Localized enlarged lymph nodes: Secondary | ICD-10-CM | POA: Diagnosis not present

## 2019-03-11 DIAGNOSIS — R110 Nausea: Secondary | ICD-10-CM | POA: Diagnosis not present

## 2019-03-11 DIAGNOSIS — Z95828 Presence of other vascular implants and grafts: Secondary | ICD-10-CM

## 2019-03-11 DIAGNOSIS — E785 Hyperlipidemia, unspecified: Secondary | ICD-10-CM | POA: Diagnosis not present

## 2019-03-11 DIAGNOSIS — Z7289 Other problems related to lifestyle: Secondary | ICD-10-CM | POA: Diagnosis not present

## 2019-03-11 LAB — CBC WITH DIFFERENTIAL (CANCER CENTER ONLY)
Abs Immature Granulocytes: 0.05 10*3/uL (ref 0.00–0.07)
Basophils Absolute: 0.1 10*3/uL (ref 0.0–0.1)
Basophils Relative: 1 %
Eosinophils Absolute: 0.3 10*3/uL (ref 0.0–0.5)
Eosinophils Relative: 3 %
HCT: 35.3 % — ABNORMAL LOW (ref 39.0–52.0)
Hemoglobin: 12 g/dL — ABNORMAL LOW (ref 13.0–17.0)
Immature Granulocytes: 1 %
Lymphocytes Relative: 32 %
Lymphs Abs: 3.3 10*3/uL (ref 0.7–4.0)
MCH: 31.2 pg (ref 26.0–34.0)
MCHC: 34 g/dL (ref 30.0–36.0)
MCV: 91.7 fL (ref 80.0–100.0)
Monocytes Absolute: 1.3 10*3/uL — ABNORMAL HIGH (ref 0.1–1.0)
Monocytes Relative: 13 %
Neutro Abs: 5.3 10*3/uL (ref 1.7–7.7)
Neutrophils Relative %: 50 %
Platelet Count: 245 10*3/uL (ref 150–400)
RBC: 3.85 MIL/uL — ABNORMAL LOW (ref 4.22–5.81)
RDW: 12.9 % (ref 11.5–15.5)
WBC Count: 10.2 10*3/uL (ref 4.0–10.5)
nRBC: 0 % (ref 0.0–0.2)

## 2019-03-11 LAB — CMP (CANCER CENTER ONLY)
ALT: 20 U/L (ref 0–44)
AST: 20 U/L (ref 15–41)
Albumin: 3.6 g/dL (ref 3.5–5.0)
Alkaline Phosphatase: 80 U/L (ref 38–126)
Anion gap: 10 (ref 5–15)
BUN: 13 mg/dL (ref 8–23)
CO2: 26 mmol/L (ref 22–32)
Calcium: 9.5 mg/dL (ref 8.9–10.3)
Chloride: 102 mmol/L (ref 98–111)
Creatinine: 1.05 mg/dL (ref 0.61–1.24)
GFR, Est AFR Am: >60 mL/min (ref >60)
GFR, Estimated: >60 mL/min (ref >60)
Glucose, Bld: 156 mg/dL — ABNORMAL HIGH (ref 70–99)
Potassium: 4.1 mmol/L (ref 3.5–5.1)
Sodium: 138 mmol/L (ref 135–145)
Total Bilirubin: 1.2 mg/dL (ref 0.3–1.2)
Total Protein: 7 g/dL (ref 6.5–8.1)

## 2019-03-11 LAB — MAGNESIUM: Magnesium: 1.5 mg/dL — ABNORMAL LOW (ref 1.7–2.4)

## 2019-03-11 MED ORDER — SODIUM CHLORIDE 0.9% FLUSH
10.0000 mL | INTRAVENOUS | Status: DC | PRN
  Administered 2019-03-11: 10 mL via INTRAVENOUS
  Filled 2019-03-11: qty 10

## 2019-03-11 MED ORDER — HEPARIN SOD (PORK) LOCK FLUSH 100 UNIT/ML IV SOLN
500.0000 [IU] | Freq: Once | INTRAVENOUS | Status: AC
  Administered 2019-03-11: 500 [IU] via INTRAVENOUS
  Filled 2019-03-11: qty 5

## 2019-03-11 MED ORDER — OXYCODONE HCL 5 MG PO CAPS: 5.0000 mg | ORAL_CAPSULE | Freq: Four times a day (QID) | ORAL | 0 refills | Status: DC | PRN

## 2019-03-11 NOTE — Progress Notes (Signed)
[facility] CSW Progress Notes  Referral received from MD to assess for stress/anxiety concerns.  Called patient, no answer but left generic VM w my contact information.  Will also schedule to see patient at tomorrow's appt in infusion room.  Edwyna Shell, LCSW Clinical Social Worker Phone:  [phone]

## 2019-03-11 NOTE — Progress Notes (Addendum)
  Brief nutrition follow up completed with patient. He states he likes and tolerated CIB mixed with skim milk. He has been able to eat a little food. He is to receive chemotherapy tomorrow. Weight documented 147.8 pounds June 11 increased from 146.8 pounds June 4. I have provided a variety of oral nutrition supplements for him to try. I have encouraged him to try different products to see if any is better tolerated than another. Patient is very [employer] attorney. Follow-up visit is scheduled for Thursday, June 18.

## 2019-03-12 ENCOUNTER — Inpatient Hospital Stay: Payer: Medicare Other

## 2019-03-12 ENCOUNTER — Telehealth: Payer: Self-pay | Admitting: Nurse Practitioner

## 2019-03-12 ENCOUNTER — Inpatient Hospital Stay: Payer: Medicare Other | Admitting: General Practice

## 2019-03-12 ENCOUNTER — Other Ambulatory Visit: Payer: Self-pay

## 2019-03-12 VITALS — BP 132/78 | HR 78 | Temp 98.0°F | Resp 18

## 2019-03-12 DIAGNOSIS — R197 Diarrhea, unspecified: Secondary | ICD-10-CM | POA: Diagnosis not present

## 2019-03-12 DIAGNOSIS — C240 Malignant neoplasm of extrahepatic bile duct: Secondary | ICD-10-CM | POA: Diagnosis not present

## 2019-03-12 DIAGNOSIS — C221 Intrahepatic bile duct carcinoma: Secondary | ICD-10-CM

## 2019-03-12 DIAGNOSIS — Z5111 Encounter for antineoplastic chemotherapy: Secondary | ICD-10-CM | POA: Diagnosis not present

## 2019-03-12 DIAGNOSIS — R110 Nausea: Secondary | ICD-10-CM | POA: Diagnosis not present

## 2019-03-12 DIAGNOSIS — R590 Localized enlarged lymph nodes: Secondary | ICD-10-CM | POA: Diagnosis not present

## 2019-03-12 DIAGNOSIS — R5383 Other fatigue: Secondary | ICD-10-CM | POA: Diagnosis not present

## 2019-03-12 LAB — CANCER ANTIGEN 19-9: CA 19-9: 118 U/mL — ABNORMAL HIGH (ref 0–35)

## 2019-03-12 MED ORDER — SODIUM CHLORIDE 0.9 % IV SOLN
Freq: Once | INTRAVENOUS | Status: AC
  Filled 2019-03-12: qty 250

## 2019-03-12 MED ORDER — HEPARIN SOD (PORK) LOCK FLUSH 100 UNIT/ML IV SOLN
500.0000 [IU] | Freq: Once | INTRAVENOUS | Status: AC | PRN
  Administered 2019-03-12: 17:00:00 500 [IU]
  Filled 2019-03-12: qty 5

## 2019-03-12 MED ORDER — OXYCODONE-ACETAMINOPHEN 5-325 MG PO TABS
1.0000 | ORAL_TABLET | Freq: Once | ORAL | Status: AC
  Administered 2019-03-12: 1 via ORAL

## 2019-03-12 MED ORDER — POTASSIUM CHLORIDE 2 MEQ/ML IV SOLN
Freq: Once | INTRAVENOUS | Status: AC
  Administered 2019-03-12: 10:00:00 via INTRAVENOUS
  Filled 2019-03-12: qty 1000

## 2019-03-12 MED ORDER — SODIUM CHLORIDE 0.9 % IV SOLN
Freq: Once | INTRAVENOUS | Status: AC
  Administered 2019-03-12: 09:00:00 via INTRAVENOUS
  Filled 2019-03-12: qty 250

## 2019-03-12 MED ORDER — SODIUM CHLORIDE 0.9 % IV SOLN
Freq: Once | INTRAVENOUS | Status: AC
  Administered 2019-03-12: 12:00:00 via INTRAVENOUS
  Filled 2019-03-12: qty 5

## 2019-03-12 MED ORDER — SODIUM CHLORIDE 0.9% FLUSH
10.0000 mL | INTRAVENOUS | Status: DC | PRN
  Administered 2019-03-12: 10 mL
  Filled 2019-03-12: qty 10

## 2019-03-12 MED ORDER — SODIUM CHLORIDE 0.9 % IV SOLN
25.0000 mg/m2 | Freq: Once | INTRAVENOUS | Status: AC
  Administered 2019-03-12: 45 mg via INTRAVENOUS
  Filled 2019-03-12: qty 45

## 2019-03-12 MED ORDER — MAGNESIUM OXIDE 400 (241.3 MG) MG PO TABS: 400.0000 mg | ORAL_TABLET | Freq: Every day | ORAL | 2 refills | Status: DC

## 2019-03-12 MED ORDER — OXYCODONE-ACETAMINOPHEN 5-325 MG PO TABS
ORAL_TABLET | ORAL | Status: AC
  Filled 2019-03-12: qty 1

## 2019-03-12 MED ORDER — PALONOSETRON HCL INJECTION 0.25 MG/5ML
INTRAVENOUS | Status: AC
  Filled 2019-03-12: qty 5

## 2019-03-12 MED ORDER — PALONOSETRON HCL INJECTION 0.25 MG/5ML
0.2500 mg | Freq: Once | INTRAVENOUS | Status: AC
  Administered 2019-03-12: 12:00:00 0.25 mg via INTRAVENOUS

## 2019-03-12 MED ORDER — SODIUM CHLORIDE 0.9 % IV SOLN
1800.0000 mg | Freq: Once | INTRAVENOUS | Status: AC
  Administered 2019-03-12: 1800 mg via INTRAVENOUS
  Filled 2019-03-12: qty 47.34

## 2019-03-12 NOTE — Progress Notes (Signed)
Spoke w/ Dr. Burr Medico - increase magnesium in cisplatin fluids to 4 grams for low level today at 1.5 and presumed wasting w/ cisplatin.   She would like this magnesium increase in cisplatin fluids to be for all treatments going forward - dose may be decreased in the future if needed.   Demetrius Charity, PharmD, [facility] Pharmac[facility] Phone: [phone] 03/12/2019

## 2019-03-12 NOTE — Telephone Encounter (Signed)
Scheduled appt per 6/11 los. Added treatment to the book for approval. Will contact patient once treatment has been added.

## 2019-03-12 NOTE — Addendum Note (Signed)
Addended by: [name] on: 03/12/2019 09:22 AM   Modules accepted: Orders

## 2019-03-12 NOTE — Progress Notes (Signed)
[facility] CSW Progress Notes  Reuest from Elray Buba, RN to see patient in infusion, tearful over recent and unexpected death of friend "I guess Im the last one."  Visited with patient in infusion.  Pt recalls good relationships w multiple friends at various worksites over his lifetime - as he and friends have aged, people have died.  He does have connections to family and friends who are more distant.  Current COVID restrictions have meant that he stays mostly at home, where he enjoys playing the piano, oil painting and walking outside when possible.  Used to work in [employer] - can return to flexible part time job when health permits and COVID restrictions are lifted.  Discussed various ways to find meaning and balance despite current restrictions.  Encouraged him to consider connecting with outside resources at the [facility], provided information packet.  Edwyna Shell, LCSW Clinical Social Worker Phone:  [phone]

## 2019-03-12 NOTE — Progress Notes (Signed)
1100 -  Pt stated almost complete relief of abdominal pain -  1/10 .

## 2019-03-12 NOTE — Patient Instructions (Addendum)
Coronavirus (COVID-19) Are you at risk?  Are you at risk for the Coronavirus (COVID-19)?  To be considered HIGH RISK for Coronavirus (COVID-19), you have to meet the following criteria:  . Traveled to [place], [place], [place], [place] or [place]; or in the [place] to [place], [place], [place], or [place]; and have fever, cough, and shortness of breath within the last 2 weeks of travel OR . Been in close contact with a person diagnosed with COVID-19 within the last 2 weeks and have fever, cough, and shortness of breath . IF YOU DO NOT MEET THESE CRITERIA, YOU ARE CONSIDERED LOW RISK FOR COVID-19.  What to do if you are HIGH RISK for COVID-19?  . If you are having a medical emergency, call 911. . Seek medical care right away. Before you go to a doctor's office, urgent care or emergency department, call ahead and tell them about your recent travel, contact with someone diagnosed with COVID-19, and your symptoms. You should receive instructions from your physician's office regarding next steps of care.  . When you arrive at healthcare provider, tell the healthcare staff immediately you have returned from visiting [place], [place], [place], [place] or [place]; or traveled in the [place] to [place], [place], [place], or [place]; in the last two weeks or you have been in close contact with a person diagnosed with COVID-19 in the last 2 weeks.   . Tell the health care staff about your symptoms: fever, cough and shortness of breath. . After you have been seen by a medical provider, you will be either: o Tested for (COVID-19) and discharged home on quarantine except to seek medical care if symptoms worsen, and asked to  - Stay home and avoid contact with others until you get your results (4-5 days)  - Avoid travel on public transportation if possible (such as bus, train, or airplane) or o Sent to the Emergency Department by EMS for evaluation, COVID-19 testing, and possible  admission depending on your condition and test results.  What to do if you are LOW RISK for COVID-19?  Reduce your risk of any infection by using the same precautions used for avoiding the common cold or flu:  . Wash your hands often with soap and warm water for at least 20 seconds.  If soap and water are not readily available, use an alcohol-based hand sanitizer with at least 60% alcohol.  . If coughing or sneezing, cover your mouth and nose by coughing or sneezing into the elbow areas of your shirt or coat, into a tissue or into your sleeve (not your hands). . Avoid shaking hands with others and consider head nods or verbal greetings only. . Avoid touching your eyes, nose, or mouth with unwashed hands.  . Avoid close contact with people who are sick. . Avoid places or events with large numbers of people in one location, like concerts or sporting events. . Carefully consider travel plans you have or are making. . If you are planning any travel outside or inside the [place], visit the CDC's Travelers' Health webpage for the latest health notices. . If you have some symptoms but not all symptoms, continue to monitor at home and seek medical attention if your symptoms worsen. . If you are having a medical emergency, call 911.   ADDITIONAL HEALTHCARE OPTIONS FOR PATIENTS  [facility] / e-Visit: [email or web address]         [facility]: [phone]  Onset   Urgent Care: [facility]: [facility] Discharge Instructions for Patients Receiving Chemotherapy  Today you received the following chemotherapy agents :  Gemcitabine,  Cisplatin.  To help prevent nausea and vomiting after your treatment, we encourage you to take your nausea medication as prescribed.  TAKE COMPAZINE 10 MG EVERY 6 HOURS AS NEEDED FOR PAIN.  DO NOT DRIVE AS IT CAN CAUSE  DROWSINESS.  TAKE ZOFRAN 8 MG EVERY 12 HOURS AS NEEDED FOR NAUSEA.  START ON THIRD DAY AFTER CHEMO.  REASON IS YOU RECEIVED  ALOXI FOR ANTINAUSEA BEFORE YOUR CHEMO TODAY.  DO DRINK LOTS OF FLUIDS AS TOLERATED.  DO NOT EAT GREASY NOR SPICY FOODS.   If you develop nausea and vomiting that is not controlled by your nausea medication, call the clinic.   BELOW ARE SYMPTOMS THAT SHOULD BE REPORTED IMMEDIATELY:  *FEVER GREATER THAN 100.5 F  *CHILLS WITH OR WITHOUT FEVER  NAUSEA AND VOMITING THAT IS NOT CONTROLLED WITH YOUR NAUSEA MEDICATION  *UNUSUAL SHORTNESS OF BREATH  *UNUSUAL BRUISING OR BLEEDING  TENDERNESS IN MOUTH AND THROAT WITH OR WITHOUT PRESENCE OF ULCERS  *URINARY PROBLEMS  *BOWEL PROBLEMS  UNUSUAL RASH Items with * indicate a potential emergency and should be followed up as soon as possible.  Feel free to call the clinic should you have any questions or concerns. The clinic phone number is [phone].  Please show the [facility] at check-in to the Emergency Department and triage nurse.

## 2019-03-15 ENCOUNTER — Telehealth: Payer: Self-pay | Admitting: *Deleted

## 2019-03-15 ENCOUNTER — Ambulatory Visit (HOSPITAL_COMMUNITY): Payer: Medicare Other

## 2019-03-15 ENCOUNTER — Other Ambulatory Visit (HOSPITAL_COMMUNITY): Payer: Medicare Other

## 2019-03-15 NOTE — Telephone Encounter (Signed)
-----   Message from Zola Button, RN sent at 03/12/2019  5:15 PM EDT ----- Regarding: Dr. Burr Medico; First time F/U call Pt received first time Gemzar/Cisplatin on 03/12/19. Tolerated well. Thank you!

## 2019-03-15 NOTE — Telephone Encounter (Signed)
Called Neysa Bonito for chemotherapy F/U.  Patient is doing well.  Denies n/v.  Denies any new side effects or symptoms.  Bowel and bladder functioning well.  Eating and drinking well.  Instructed to drink 64 oz minimum daily or at least the day before, of and after treatment.   Denies questions or needs at this time.  Encouraged to call [phone] Mon -Fri 8:00 am - 4:30 pm or anytime as needed for symptoms, changes or event outside office hours.

## 2019-03-17 ENCOUNTER — Telehealth: Payer: Self-pay | Admitting: Hematology

## 2019-03-17 NOTE — Telephone Encounter (Signed)
Added injection 6/22. Left message for patient.other appointments remain the same.

## 2019-03-18 ENCOUNTER — Inpatient Hospital Stay: Payer: Medicare Other | Admitting: Nutrition

## 2019-03-18 NOTE — Progress Notes (Signed)
[place]   [phone] [phone]   Clinic Follow up Note   Patient Care Team: [name], DO as PCP - General (Family Medicine)  Date of Service:  03/19/2019  CHIEF COMPLAINT: F/u of Extrahepatic Cholangiocarcinoma  SUMMARY OF ONCOLOGIC HISTORY: Oncology History  Cholangiocarcinoma ([place])  01/14/2019 Imaging   US Abdomen 01/14/19  IMPRESSION: There is significant intrahepatic and extrahepatic biliary dilatation present, concerning for distal common bile duct obstruction. Further evaluation with CT scan or MRCP is recommended. Correlation with liver function tests is recommended as well. These results will be called to the ordering clinician or representative by the Radiologist Assistant, and communication documented in the PACS or zVision Dashboard.   Probable large amount of sludge seen within gallbladder lumen with mild gallbladder wall thickening. However, no cholelithiasis, pericholecystic fluid or sonographic Murphy's sign is noted.   4.2 cm septated cyst seen in upper pole of right kidney consistent with Bosniak type 2 lesion. Follow-up ultrasound in 1 year is recommended to ensure stability.   01/20/2019 Imaging   MRI abdomen 01/20/19  IMPRESSION: 1. Findings are highly concerning for central tumor in the biliary tract at the confluence of the common hepatic duct, cystic duct and common bile ducts. Further clinical evaluation for potential cholangiocarcinoma is strongly recommended. 2. Mild ductal dilatation of the main pancreatic duct throughout the distal body and tail of the pancreas where there is also some associated side branch ectasia. This may suggest a pancreatic ductal stricture. No obstructing pancreatic neoplasm identified. 3. Aortic atherosclerosis.   01/21/2019 Initial Biopsy   Diagnosis 01/21/19  BILE DUCT BRUSHING (SPECIMEN 1 OF 1 COLLECTED 01/21/2019) ADENOCARCINOMA.   01/21/2019 Procedure   ERCP By Dr hung  01/21/19 IMRPESSION - The major papilla appeared normal. - A single localized biliary stricture was found in the middle third of the main bile duct. - The entire main bile duct and upper third of the main bile duct were dilated, secondary to a stricture. - A biliary sphincterotomy was performed. - Cells for cytology obtained in the middle third of the main bile duct. - One plastic stent was placed into the common bile duct.  EUS by Dr hung 01/21/19  IMPRESSION - There was dilation in the middle third of the main bile duct and in the upper third of the main bile duct which measured up to 15 mm. - There was a suggestion of a stricture in the middle third of the main bile duct. - No specimens collected.   02/03/2019 Imaging   CT CAP at [facility] 02/03/19  Impression:  1. There is mild intrahepatic and extrahepatic biliary ductal dilation and diffuse common bile duct wall thickening with stent in place. There is abnormal soft tissue measuring approximately 1.6 cm between the common hepatic artery, portal vein and common bile duct, worrisome for the known cholangiocarcinoma.  2. Periportal lymphadenopathy which may represent nodal metastasis.  4. Marked pancreatic atrophy and duct dilation involving the distal body and tail the pancreas where there is a coarse calcification. These findings are favored to represent stenosis from intraductal stones though an underlying stricture cannot be completely excluded.  5. Atypical symmetric prominent fat stranding of the bilateral lower abdominal wall. Correlate with surgical history or history of history of trauma.   03/08/2019 Initial Diagnosis   Cholangiocarcinoma ([place])   03/12/2019 -  Chemotherapy   gemcitabine and cisplatin on days 1 and 8 every 21 days starting 03/12/19.       CURRENT THERAPY:  gemcitabine and cisplatin on days 1 and 8 every 21 days starting 03/12/19  INTERVAL HISTORY:  Christopher Welch is here for a follow up and treatment.  He presents to the clinic alone. He notes his first cycle went well. He had mild fatigue, nausea or diarrhea. He take antiemetics apophylactically. He fever as well.    REVIEW OF SYSTEMS:   Constitutional: Denies fevers, chills or abnormal weight loss Eyes: Denies blurriness of vision Ears, nose, mouth, throat, and face: Denies mucositis or sore throat Respiratory: Denies cough, dyspnea or wheezes Cardiovascular: Denies palpitation, chest discomfort or lower extremity swelling Gastrointestinal:  Denies nausea, heartburn or change in bowel habits Skin: Denies abnormal skin rashes Lymphatics: Denies new lymphadenopathy or easy bruising Neurological:Denies numbness, tingling or new weaknesses Behavioral/Psych: Mood is stable, no new changes  All other systems were reviewed with the patient and are negative.  MEDICAL HISTORY:  Past Medical History:  Diagnosis Date  . Diabetes mellitus type I, controlled ([place])   . Dyslipidemia   . ED (erectile dysfunction)   . Essential hypertension   . Gilbert's disease   . Hepatitis B carrier [facility])     SURGICAL HISTORY: Past Surgical History:  Procedure Laterality Date  . APPENDECTOMY  1964  . BILIARY BRUSHING  01/21/2019   Procedure: BILIARY BRUSHING;  Surgeon: Carol Ada, MD;  Location: [facility] ENDOSCOPY;  Service: Endoscopy;;  . BILIARY STENT PLACEMENT N/A 01/21/2019   Procedure: BILIARY STENT PLACEMENT;  Surgeon: Carol Ada, MD;  Location: [facility]OS[facility];  Service: Endoscopy;  Laterality: N/A;  . ENDOSCOPIC RETROGRADE CHOLANGIOPANCREATOGRAPHY (ERCP) WITH PROPOFOL N/A 01/21/2019   Procedure: ENDOSCOPIC RETROGRADE CHOLANGIOPANCREATOGRAPHY (ERCP) WITH PROPOFOL;  Surgeon: Carol Ada, MD;  Location: [facility];  Service: Endoscopy;  Laterality: N/A;  . ESOPHAGOGASTRODUODENOSCOPY (EGD) WITH PROPOFOL N/A 01/21/2019   Procedure: ESOPHAGOGASTRODUODENOSCOPY (EGD) WITH PROPOFOL;  Surgeon: Carol Ada, MD;  Location: [facility];  Service: Endoscopy;   Laterality: N/A;  . FEMORAL HERNIA REPAIR    . IR IMAGING GUIDED PORT INSERTION  03/08/2019  . LEFT HEART CATH AND CORONARY ANGIOGRAPHY N/A 05/19/2017   Procedure: LEFT HEART CATH AND CORONARY ANGIOGRAPHY;  Surgeon: Leonie Man, MD;  Location: [facility] LAB;  Service: Cardiovascular;  Laterality: N/A;  . SPHINCTEROTOMY  01/21/2019   Procedure: SPHINCTEROTOMY;  Surgeon: Carol Ada, MD;  Location: [facility];  Service: Endoscopy;;  . UPPER ESOPHAGEAL ENDOSCOPIC ULTRASOUND (EUS) N/A 01/21/2019   Procedure: UPPER ESOPHAGEAL ENDOSCOPIC ULTRASOUND (EUS);  Surgeon: Carol Ada, MD;  Location: [facility];  Service: Endoscopy;  Laterality: N/A;    I have reviewed the social history and family history with the patient and they are unchanged from previous note.  ALLERGIES:  is allergic to erythromycin.  MEDICATIONS:  Current Outpatient Medications  Medication Sig Dispense Refill  . amLODipine (NORVASC) 5 MG tablet Take 5 mg by mouth daily.     Marland Kitchen atenolol (TENORMIN) 25 MG tablet Take by mouth daily. Once a day    . atorvastatin (LIPITOR) 10 MG tablet Take 10 mg by mouth daily.    . Blood Glucose Monitoring Suppl (ACCU-CHEK NANO SMARTVIEW) W/DEVICE KIT 1 kit by Does not apply route 2 (two) times daily. (Patient taking differently: 1 kit by Does not apply route See admin instructions. Test blood sugars 12x's daily) 1 kit 0  . DiphenhydrAMINE HCl (ZZZQUIL) 50 MG/30ML LIQD Take 60 mLs by mouth at bedtime.    Marland Kitchen glucose blood test strip Test 3 times a day. (Patient taking differently: 1 each by  Other route See admin instructions. Test 12 times a day.) 300 each Prn  . insulin NPH Human (NOVOLIN N) 100 UNIT/ML injection Inject 10 Units into the skin 2 (two) times a day.    . insulin regular (NOVOLIN R) 100 units/mL injection Inject 10 Units into the skin 3 (three) times daily before meals.    . lidocaine-prilocaine (EMLA) cream Apply to affected area once 30 g 3  . lisinopril (ZESTRIL) 20 MG  tablet Take 20 mg by mouth daily.     . magnesium oxide (MAG-OX) 400 (241.3 Mg) MG tablet Take 1 tablet (400 mg total) by mouth daily. 30 tablet 2  . metFORMIN (GLUCOPHAGE) 1000 MG tablet Take 2,000 mg by mouth every evening.     . Niacin CR (SLO-NIACIN) 750 MG TBCR Take 1,500 mg by mouth at bedtime.    Marland Kitchen omeprazole (PRILOSEC OTC) 20 MG tablet Take 20 mg by mouth daily.    . ondansetron (ZOFRAN) 8 MG tablet Take 1 tablet (8 mg total) by mouth 2 (two) times daily as needed. Start on the third day after chemotherapy. 30 tablet 1  . oxycodone (OXY-IR) 5 MG capsule Take 1 capsule (5 mg total) by mouth every 6 (six) hours as needed for pain. 30 capsule 0  . prochlorperazine (COMPAZINE) 10 MG tablet Take 1 tablet (10 mg total) by mouth every 6 (six) hours as needed (Nausea or vomiting). 30 tablet 1  . aspirin 325 MG tablet Take 325 mg by mouth at bedtime.      No current facility-administered medications for this visit.    Facility-Administered Medications Ordered in Other Visits  Medication Dose Route Frequency Provider Last Rate Last Dose  . CISplatin (PLATINOL) 45 mg in sodium chloride 0.9 % 250 mL chemo infusion  25 mg/m2 (Treatment Plan Recorded) Intravenous Once [name], MD      . dextrose 5 % and 0.45% NaCl 1,000 mL with potassium chloride 20 mEq, magnesium sulfate 4 g infusion   Intravenous Once [name], MD 250 mL/hr at 03/19/19 1007    . gemcitabine (GEMZAR) 1,800 mg in sodium chloride 0.9 % 250 mL chemo infusion  1,800 mg Intravenous Once [name], MD      . heparin lock flush 100 unit/mL  500 Units Intracatheter Once PRN [name], MD      . sodium chloride flush (NS) 0.9 % injection 10 mL  10 mL Intracatheter PRN [name], MD        PHYSICAL EXAMINATION: ECOG PERFORMANCE STATUS: 1 - Symptomatic but completely ambulatory  Vitals:   03/19/19 0914  BP: 136/75  Pulse: 82  Resp: 18  Temp: 98.3 F (36.8 C)  SpO2: 100%   Filed Weights   03/19/19 0914  Weight: 146 lb 14.4 oz  (66.6 kg)    GENERAL:alert, no distress and comfortable SKIN: skin color, texture, turgor are normal, no rashes or significant lesions EYES: normal, Conjunctiva are pink and non-injected, sclera clear  NECK: supple, thyroid normal size, non-tender, without nodularity LYMPH:  no palpable lymphadenopathy in the cervical, axillary  LUNGS: clear to auscultation and percussion with normal breathing effort HEART: regular rate & rhythm and no murmurs and no lower extremity edema ABDOMEN:abdomen soft, non-tender and normal bowel sounds Musculoskeletal:no cyanosis of digits and no clubbing  NEURO: alert & oriented x 3 with fluent speech, no focal motor/sensory deficits  LABORATORY DATA:  I have reviewed the data as listed CBC Latest Ref Rng & Units 03/19/2019 03/11/2019 03/08/2019  WBC 4.0 -  10.5 K/uL 7.8 10.2 12.8(H)  Hemoglobin 13.0 - 17.0 g/dL 11.6(L) 12.0(L) 13.1  Hematocrit 39.0 - 52.0 % 34.7(L) 35.3(L) 40.2  Platelets 150 - 400 K/uL 182 245 369     CMP Latest Ref Rng & Units 03/19/2019 03/11/2019 03/08/2019  Glucose 70 - 99 mg/dL 239(H) 156(H) 150(H)  BUN 8 - 23 mg/dL _0 Creatinine 0.61 - 1.24 mg/dL 1.22 1.05 1.10  Sodium 135 - 145 mmol/L 134(L) 138 140  Potassium 3.5 - 5.1 mmol/L 4.7 4.1 3.4(L)  Chloride 98 - 111 mmol/L 99 102 104  CO2 22 - 32 mmol/L _1 Calcium 8.9 - 10.3 mg/dL 9.4 9.5 9.5  Total Protein 6.5 - 8.1 g/dL 7.0 7.0 -  Total Bilirubin 0.3 - 1.2 mg/dL 0.8 1.2 -  Alkaline Phos 38 - 126 U/L 76 80 -  AST 15 - 41 U/L 18 20 -  ALT 0 - 44 U/L 24 20 -      RADIOGRAPHIC STUDIES: I have personally reviewed the radiological images as listed and agreed with the findings in the report. No results found.   ASSESSMENT & PLAN:  Christopher Welch is a 73 y.o. male with   1. Extrahepatic cholangiocarcinoma  -He was recently diagnosed in 12/2018. His work up revealed malignant stricture and CBD obstruction required stenting. Brushing revealed adenocarcinoma. His extrahepatic  cholangiocarcinoma was not resectable due to vascular invasion (surgeon - Dr. Zenia Resides at [facility]) -He was referred to our clinic to initiate chemotherapy in the neoadjuvant setting. I recommended gemcitabine and cisplatin for cycle 1, on days 1 and 8 every 21 days, with plan to add abraxane with cycle 2 if he tolerates well based on phase 2 data. He started chemo on 03/12/19 -He will follow-up at the [facility] after 3 months of chemo with restaging scan. -He tolerated first week well with mild fatigue. Labs reviewed, CBC and CMP WNL except Hg 11.6, BG 239. Mag 1.7. Overall adequate to proceed with C1D8 Gemcitabine/Cisplatin.  -He plans to return to [facility] in August for restaging scans and f/u to reconsider surgery. He understands with disease progression, surgery which would cure his disease would no longer an option.  -F/u in 2 weeks before cycle 2. I will likely add Abraxane to cycle 2 since he is tolerating first cycle well -will hold GCSF for first cycle and add to cycle 2 on day 9  2. Weight loss -secondary to malignancy  -he presented with 20 lbs weight loss  -We reviewed nutrition support with Glucerna -Continue to f/u with dietician  -weight stable   3. DM -managed by PCP Dr. Theda Sers -he has had DM for 20 years, he is compliant with regimen and knows how to adjust BG -on insulin    4. CAD, HL, HTN -followed by cardiologist Dr. Ellyn Hack -on amlodipine, atenolol, lisinopril, and statin  -HTN well controlled   5. Social support -he is single, no children, he lives alone -He has a good friend who can help him and take him to appointments  -I referred him to SW to assess needs - he may benefit from talking with Edwyna Shell about his stress about his diagnosis and treatment  PLAN: -Labs reviewed and adequate to proceed with C1D8 Cisplatin and Gemcitabine today  -will cancel his injection on next Monday  -Lab, flush, f/u and chemo in 2 weeks     No problem-specific Assessment & Plan  notes found for this encounter.   No orders of the defined  types were placed in this encounter.  All questions were answered. The patient knows to call the clinic with any problems, questions or concerns. No barriers to learning was detected. I spent 15 minutes counseling the patient face to face. The total time spent in the appointment was 20 minutes and more than 50% was on counseling and review of test results     [name], MD 03/19/2019   I, Joslyn Devon, am acting as scribe for [name], MD.   I have reviewed the above documentation for accuracy and completeness, and I agree with the above.

## 2019-03-18 NOTE — Progress Notes (Signed)
RD working remotely.  Patient reports he is in a lot of pain. He forces himself to eat and states he has gained a pound. He likes the strawberry CIB and generally drinks it with breakfast.  Nutrition Diagnosis: Inadequate oral intake improved.  Intervention: Educated patient to try to increase oral intake and focus on calories and protein. Asked him to monitor difference in pain level with oral intake. Increase CIB to TID with meals. Questions answered and teach back method used.  Monitoring, Evaluation, Goals: Patient will tolerate increased calories and protein to minimize weight loss.  Next Visit: Thursday, July 9.

## 2019-03-19 ENCOUNTER — Encounter: Payer: Self-pay | Admitting: Hematology

## 2019-03-19 ENCOUNTER — Other Ambulatory Visit: Payer: Self-pay

## 2019-03-19 ENCOUNTER — Inpatient Hospital Stay: Payer: Medicare Other

## 2019-03-19 ENCOUNTER — Inpatient Hospital Stay (HOSPITAL_BASED_OUTPATIENT_CLINIC_OR_DEPARTMENT_OTHER): Payer: Medicare Other | Admitting: Hematology

## 2019-03-19 ENCOUNTER — Telehealth: Payer: Self-pay | Admitting: Hematology

## 2019-03-19 VITALS — BP 136/75 | HR 82 | Temp 98.3°F | Resp 18 | Ht 68.0 in | Wt 146.9 lb

## 2019-03-19 DIAGNOSIS — R5383 Other fatigue: Secondary | ICD-10-CM

## 2019-03-19 DIAGNOSIS — N281 Cyst of kidney, acquired: Secondary | ICD-10-CM | POA: Diagnosis not present

## 2019-03-19 DIAGNOSIS — I2510 Atherosclerotic heart disease of native coronary artery without angina pectoris: Secondary | ICD-10-CM

## 2019-03-19 DIAGNOSIS — C240 Malignant neoplasm of extrahepatic bile duct: Secondary | ICD-10-CM

## 2019-03-19 DIAGNOSIS — R197 Diarrhea, unspecified: Secondary | ICD-10-CM | POA: Diagnosis not present

## 2019-03-19 DIAGNOSIS — M546 Pain in thoracic spine: Secondary | ICD-10-CM

## 2019-03-19 DIAGNOSIS — E10319 Type 1 diabetes mellitus with unspecified diabetic retinopathy without macular edema: Secondary | ICD-10-CM | POA: Diagnosis not present

## 2019-03-19 DIAGNOSIS — Z95828 Presence of other vascular implants and grafts: Secondary | ICD-10-CM | POA: Insufficient documentation

## 2019-03-19 DIAGNOSIS — R110 Nausea: Secondary | ICD-10-CM

## 2019-03-19 DIAGNOSIS — I10 Essential (primary) hypertension: Secondary | ICD-10-CM

## 2019-03-19 DIAGNOSIS — E785 Hyperlipidemia, unspecified: Secondary | ICD-10-CM

## 2019-03-19 DIAGNOSIS — Z881 Allergy status to other antibiotic agents status: Secondary | ICD-10-CM

## 2019-03-19 DIAGNOSIS — Z79899 Other long term (current) drug therapy: Secondary | ICD-10-CM

## 2019-03-19 DIAGNOSIS — R634 Abnormal weight loss: Secondary | ICD-10-CM

## 2019-03-19 DIAGNOSIS — R590 Localized enlarged lymph nodes: Secondary | ICD-10-CM | POA: Diagnosis not present

## 2019-03-19 DIAGNOSIS — E119 Type 2 diabetes mellitus without complications: Secondary | ICD-10-CM

## 2019-03-19 DIAGNOSIS — B181 Chronic viral hepatitis B without delta-agent: Secondary | ICD-10-CM

## 2019-03-19 DIAGNOSIS — Z7289 Other problems related to lifestyle: Secondary | ICD-10-CM

## 2019-03-19 DIAGNOSIS — I700 Atherosclerosis of aorta: Secondary | ICD-10-CM

## 2019-03-19 DIAGNOSIS — C221 Intrahepatic bile duct carcinoma: Secondary | ICD-10-CM

## 2019-03-19 DIAGNOSIS — Z5111 Encounter for antineoplastic chemotherapy: Secondary | ICD-10-CM | POA: Diagnosis not present

## 2019-03-19 DIAGNOSIS — Z833 Family history of diabetes mellitus: Secondary | ICD-10-CM

## 2019-03-19 DIAGNOSIS — Z794 Long term (current) use of insulin: Secondary | ICD-10-CM

## 2019-03-19 LAB — CBC WITH DIFFERENTIAL (CANCER CENTER ONLY)
Abs Immature Granulocytes: 0.02 10*3/uL (ref 0.00–0.07)
Basophils Absolute: 0 10*3/uL (ref 0.0–0.1)
Basophils Relative: 0 %
Eosinophils Absolute: 0.1 10*3/uL (ref 0.0–0.5)
Eosinophils Relative: 1 %
HCT: 34.7 % — ABNORMAL LOW (ref 39.0–52.0)
Hemoglobin: 11.6 g/dL — ABNORMAL LOW (ref 13.0–17.0)
Immature Granulocytes: 0 %
Lymphocytes Relative: 25 %
Lymphs Abs: 2 10*3/uL (ref 0.7–4.0)
MCH: 31.3 pg (ref 26.0–34.0)
MCHC: 33.4 g/dL (ref 30.0–36.0)
MCV: 93.5 fL (ref 80.0–100.0)
Monocytes Absolute: 0.7 10*3/uL (ref 0.1–1.0)
Monocytes Relative: 9 %
Neutro Abs: 5 10*3/uL (ref 1.7–7.7)
Neutrophils Relative %: 65 %
Platelet Count: 182 10*3/uL (ref 150–400)
RBC: 3.71 MIL/uL — ABNORMAL LOW (ref 4.22–5.81)
RDW: 12.5 % (ref 11.5–15.5)
WBC Count: 7.8 10*3/uL (ref 4.0–10.5)
nRBC: 0 % (ref 0.0–0.2)

## 2019-03-19 LAB — CMP (CANCER CENTER ONLY)
ALT: 24 U/L (ref 0–44)
AST: 18 U/L (ref 15–41)
Albumin: 3.5 g/dL (ref 3.5–5.0)
Alkaline Phosphatase: 76 U/L (ref 38–126)
Anion gap: 11 (ref 5–15)
BUN: 18 mg/dL (ref 8–23)
CO2: 24 mmol/L (ref 22–32)
Calcium: 9.4 mg/dL (ref 8.9–10.3)
Chloride: 99 mmol/L (ref 98–111)
Creatinine: 1.22 mg/dL (ref 0.61–1.24)
GFR, Est AFR Am: >60 mL/min (ref >60)
GFR, Estimated: 59 mL/min — ABNORMAL LOW (ref >60)
Glucose, Bld: 239 mg/dL — ABNORMAL HIGH (ref 70–99)
Potassium: 4.7 mmol/L (ref 3.5–5.1)
Sodium: 134 mmol/L — ABNORMAL LOW (ref 135–145)
Total Bilirubin: 0.8 mg/dL (ref 0.3–1.2)
Total Protein: 7 g/dL (ref 6.5–8.1)

## 2019-03-19 LAB — MAGNESIUM: Magnesium: 1.7 mg/dL (ref 1.7–2.4)

## 2019-03-19 MED ORDER — SODIUM CHLORIDE 0.9% FLUSH
10.0000 mL | INTRAVENOUS | Status: DC | PRN
  Administered 2019-03-19: 10 mL
  Filled 2019-03-19: qty 10

## 2019-03-19 MED ORDER — SODIUM CHLORIDE 0.9 % IV SOLN
Freq: Once | INTRAVENOUS | Status: AC
  Administered 2019-03-19: 10:00:00 via INTRAVENOUS
  Filled 2019-03-19: qty 250

## 2019-03-19 MED ORDER — SODIUM CHLORIDE 0.9 % IV SOLN
Freq: Once | INTRAVENOUS | Status: AC
  Administered 2019-03-19: 12:00:00 via INTRAVENOUS
  Filled 2019-03-19: qty 5

## 2019-03-19 MED ORDER — SODIUM CHLORIDE 0.9 % IV SOLN
25.0000 mg/m2 | Freq: Once | INTRAVENOUS | Status: AC
  Administered 2019-03-19: 14:00:00 45 mg via INTRAVENOUS
  Filled 2019-03-19: qty 45

## 2019-03-19 MED ORDER — HEPARIN SOD (PORK) LOCK FLUSH 100 UNIT/ML IV SOLN
500.0000 [IU] | Freq: Once | INTRAVENOUS | Status: AC | PRN
  Administered 2019-03-19: 500 [IU]
  Filled 2019-03-19: qty 5

## 2019-03-19 MED ORDER — PALONOSETRON HCL INJECTION 0.25 MG/5ML
INTRAVENOUS | Status: AC
  Filled 2019-03-19: qty 5

## 2019-03-19 MED ORDER — SODIUM CHLORIDE 0.9 % IV SOLN
1800.0000 mg | Freq: Once | INTRAVENOUS | Status: AC
  Administered 2019-03-19: 13:00:00 1800 mg via INTRAVENOUS
  Filled 2019-03-19: qty 47.34

## 2019-03-19 MED ORDER — PALONOSETRON HCL INJECTION 0.25 MG/5ML
0.2500 mg | Freq: Once | INTRAVENOUS | Status: AC
  Administered 2019-03-19: 12:00:00 0.25 mg via INTRAVENOUS

## 2019-03-19 MED ORDER — POTASSIUM CHLORIDE 2 MEQ/ML IV SOLN
Freq: Once | INTRAVENOUS | Status: AC
  Administered 2019-03-19: 10:00:00 via INTRAVENOUS
  Filled 2019-03-19: qty 1000

## 2019-03-19 NOTE — Patient Instructions (Addendum)
Coronavirus (COVID-19) Are you at risk?  Are you at risk for the Coronavirus (COVID-19)?  To be considered HIGH RISK for Coronavirus (COVID-19), you have to meet the following criteria:  . Traveled to [place], [place], [place], [place] or [place]; or in the [place] to [place], [place], [place], or [place]; and have fever, cough, and shortness of breath within the last 2 weeks of travel OR . Been in close contact with a person diagnosed with COVID-19 within the last 2 weeks and have fever, cough, and shortness of breath . IF YOU DO NOT MEET THESE CRITERIA, YOU ARE CONSIDERED LOW RISK FOR COVID-19.  What to do if you are HIGH RISK for COVID-19?  . If you are having a medical emergency, call 911. . Seek medical care right away. Before you go to a doctor's office, urgent care or emergency department, call ahead and tell them about your recent travel, contact with someone diagnosed with COVID-19, and your symptoms. You should receive instructions from your physician's office regarding next steps of care.  . When you arrive at healthcare provider, tell the healthcare staff immediately you have returned from visiting [place], [place], [place], [place] or [place]; or traveled in the [place] to [place], [place], [place], or [place]; in the last two weeks or you have been in close contact with a person diagnosed with COVID-19 in the last 2 weeks.   . Tell the health care staff about your symptoms: fever, cough and shortness of breath. . After you have been seen by a medical provider, you will be either: o Tested for (COVID-19) and discharged home on quarantine except to seek medical care if symptoms worsen, and asked to  - Stay home and avoid contact with others until you get your results (4-5 days)  - Avoid travel on public transportation if possible (such as bus, train, or airplane) or o Sent to the Emergency Department by EMS for evaluation, COVID-19 testing, and possible  admission depending on your condition and test results.  What to do if you are LOW RISK for COVID-19?  Reduce your risk of any infection by using the same precautions used for avoiding the common cold or flu:  . Wash your hands often with soap and warm water for at least 20 seconds.  If soap and water are not readily available, use an alcohol-based hand sanitizer with at least 60% alcohol.  . If coughing or sneezing, cover your mouth and nose by coughing or sneezing into the elbow areas of your shirt or coat, into a tissue or into your sleeve (not your hands). . Avoid shaking hands with others and consider head nods or verbal greetings only. . Avoid touching your eyes, nose, or mouth with unwashed hands.  . Avoid close contact with people who are sick. . Avoid places or events with large numbers of people in one location, like concerts or sporting events. . Carefully consider travel plans you have or are making. . If you are planning any travel outside or inside the [place], visit the CDC's Travelers' Health webpage for the latest health notices. . If you have some symptoms but not all symptoms, continue to monitor at home and seek medical attention if your symptoms worsen. . If you are having a medical emergency, call 911.   ADDITIONAL HEALTHCARE OPTIONS FOR PATIENTS  [facility] / e-Visit: [email or web address]         [facility]: [phone]  Onset   Urgent Care: [facility]: [place] Discharge Instructions for Patients Receiving Chemotherapy  Today you received the following chemotherapy agents: Cisplatin and Gemzar  To help prevent nausea and vomiting after your treatment, we encourage you to take your nausea medication as prescribed.   If you develop nausea and vomiting that is not controlled by your nausea medication, call the clinic.    BELOW ARE SYMPTOMS THAT SHOULD BE REPORTED IMMEDIATELY:  *FEVER GREATER THAN 100.5 F  *CHILLS WITH OR WITHOUT FEVER  NAUSEA AND VOMITING THAT IS NOT CONTROLLED WITH YOUR NAUSEA MEDICATION  *UNUSUAL SHORTNESS OF BREATH  *UNUSUAL BRUISING OR BLEEDING  TENDERNESS IN MOUTH AND THROAT WITH OR WITHOUT PRESENCE OF ULCERS  *URINARY PROBLEMS  *BOWEL PROBLEMS  UNUSUAL RASH Items with * indicate a potential emergency and should be followed up as soon as possible.  Feel free to call the clinic should you have any questions or concerns. The clinic phone number is [phone].  Please show the [facility] at check-in to the Emergency Department and triage nurse.

## 2019-03-19 NOTE — Telephone Encounter (Signed)
No los per 6/19. °

## 2019-03-19 NOTE — Progress Notes (Signed)
Checked on pt in infusion room to see if he had any questions/concerns regarding education & he reports none.  Informed to call if he needed [place].

## 2019-03-22 ENCOUNTER — Inpatient Hospital Stay: Payer: Medicare Other

## 2019-03-22 ENCOUNTER — Other Ambulatory Visit: Payer: Self-pay

## 2019-03-22 NOTE — Progress Notes (Signed)
Neoadjuvant chemotherapy with goal of surgical resection.

## 2019-03-24 ENCOUNTER — Encounter: Payer: Self-pay | Admitting: Hematology

## 2019-03-25 ENCOUNTER — Telehealth: Payer: Self-pay | Admitting: *Deleted

## 2019-03-25 NOTE — Telephone Encounter (Signed)
Reviewed pt's MyChart message.  Spoke with pt and was informed that pt experienced nausea for past 2 days; had very little clear vomit.   Stated he had taken zofran and compazine as prescribed with relief.  Instructed pt to call office early tomorrow if symptoms worsened.

## 2019-03-29 NOTE — Progress Notes (Signed)
[place]   [phone] [phone]   Clinic Follow up Note   Patient Care Team: [name], DO as PCP - General (Family Medicine)  Date of Service:  03/31/2019  CHIEF COMPLAINT: F/u of Extrahepatic Cholangiocarcinoma  SUMMARY OF ONCOLOGIC HISTORY: Oncology History  Cholangiocarcinoma ([place])  01/14/2019 Imaging   US Abdomen 01/14/19  IMPRESSION: There is significant intrahepatic and extrahepatic biliary dilatation present, concerning for distal common bile duct obstruction. Further evaluation with CT scan or MRCP is recommended. Correlation with liver function tests is recommended as well. These results will be called to the ordering clinician or representative by the Radiologist Assistant, and communication documented in the PACS or zVision Dashboard.   Probable large amount of sludge seen within gallbladder lumen with mild gallbladder wall thickening. However, no cholelithiasis, pericholecystic fluid or sonographic Murphy's sign is noted.   4.2 cm septated cyst seen in upper pole of right kidney consistent with Bosniak type 2 lesion. Follow-up ultrasound in 1 year is recommended to ensure stability.   01/20/2019 Imaging   MRI abdomen 01/20/19  IMPRESSION: 1. Findings are highly concerning for central tumor in the biliary tract at the confluence of the common hepatic duct, cystic duct and common bile ducts. Further clinical evaluation for potential cholangiocarcinoma is strongly recommended. 2. Mild ductal dilatation of the main pancreatic duct throughout the distal body and tail of the pancreas where there is also some associated side branch ectasia. This may suggest a pancreatic ductal stricture. No obstructing pancreatic neoplasm identified. 3. Aortic atherosclerosis.   01/21/2019 Initial Biopsy   Diagnosis 01/21/19  BILE DUCT BRUSHING (SPECIMEN 1 OF 1 COLLECTED 01/21/2019) ADENOCARCINOMA.   01/21/2019 Procedure   ERCP By Dr hung  01/21/19 IMRPESSION - The major papilla appeared normal. - A single localized biliary stricture was found in the middle third of the main bile duct. - The entire main bile duct and upper third of the main bile duct were dilated, secondary to a stricture. - A biliary sphincterotomy was performed. - Cells for cytology obtained in the middle third of the main bile duct. - One plastic stent was placed into the common bile duct.  EUS by Dr hung 01/21/19  IMPRESSION - There was dilation in the middle third of the main bile duct and in the upper third of the main bile duct which measured up to 15 mm. - There was a suggestion of a stricture in the middle third of the main bile duct. - No specimens collected.   02/03/2019 Imaging   [facility] at [facility] 02/03/19  Impression:  1. There is mild intrahepatic and extrahepatic biliary ductal dilation and diffuse common bile duct wall thickening with stent in place. There is abnormal soft tissue measuring approximately 1.6 cm between the common hepatic artery, portal vein and common bile duct, worrisome for the known cholangiocarcinoma.  2. Periportal lymphadenopathy which may represent nodal metastasis.  4. Marked pancreatic atrophy and duct dilation involving the distal body and tail the pancreas where there is a coarse calcification. These findings are favored to represent stenosis from intraductal stones though an underlying stricture cannot be completely excluded.  5. Atypical symmetric prominent fat stranding of the bilateral lower abdominal wall. Correlate with surgical history or history of history of trauma.   03/08/2019 Initial Diagnosis   Cholangiocarcinoma ([place])   03/12/2019 -  Chemotherapy   gemcitabine and cisplatin on days 1 and 8 every 21 days starting 03/12/19.    03/19/2019 Cancer Staging  Staging form: Perihilar Bile Ducts, AJCC 8th Edition - Clinical: Stage Unknown (cTX, cN0, cM0) - Signed by [name], MD on 03/19/2019       CURRENT THERAPY:  gemcitabine and cisplatin on days 1 and 8 every 21 days starting 03/12/19. Abraxane added with cycle 2  INTERVAL HISTORY:  Christopher Welch is here for a follow up and treatment. He presents to the clinic alone. He notes he is eating very well even if it hurt his stomach. He notes 3-4 days after infusion he felt fatigued and weak, but recovered well. He feels he is ready to add Abraxane to this cycle.  He notes he still has trouble sleeping. He can fall asleep but has some trouble staying asleep. He will wait on prescription sleep aid for now.     REVIEW OF SYSTEMS:   Constitutional: Denies fevers, chills or abnormal weight loss (+) trouble sleeping  Eyes: Denies blurriness of vision Ears, nose, mouth, throat, and face: Denies mucositis or sore throat Respiratory: Denies cough, dyspnea or wheezes Cardiovascular: Denies palpitation, chest discomfort or lower extremity swelling Gastrointestinal:  Denies nausea, heartburn or change in bowel habits Skin: Denies abnormal skin rashes Lymphatics: Denies new lymphadenopathy or easy bruising Neurological:Denies numbness, tingling or new weaknesses Behavioral/Psych: Mood is stable, no new changes  All other systems were reviewed with the patient and are negative.  MEDICAL HISTORY:  Past Medical History:  Diagnosis Date  . Diabetes mellitus type I, controlled ([place])   . Dyslipidemia   . ED (erectile dysfunction)   . Essential hypertension   . Gilbert's disease   . Hepatitis B carrier [facility])     SURGICAL HISTORY: Past Surgical History:  Procedure Laterality Date  . APPENDECTOMY  1964  . BILIARY BRUSHING  01/21/2019   Procedure: BILIARY BRUSHING;  Surgeon: Carol Ada, MD;  Location: [facility];  Service: Endoscopy;;  . BILIARY STENT PLACEMENT N/A 01/21/2019   Procedure: BILIARY STENT PLACEMENT;  Surgeon: Carol Ada, MD;  Location: [facility];  Service: Endoscopy;  Laterality: N/A;  . ENDOSCOPIC RETROGRADE  CHOLANGIOPANCREATOGRAPHY (ERCP) WITH PROPOFOL N/A 01/21/2019   Procedure: ENDOSCOPIC RETROGRADE CHOLANGIOPANCREATOGRAPHY (ERCP) WITH PROPOFOL;  Surgeon: Carol Ada, MD;  Location: [facility];  Service: Endoscopy;  Laterality: N/A;  . ESOPHAGOGASTRODUODENOSCOPY (EGD) WITH PROPOFOL N/A 01/21/2019   Procedure: ESOPHAGOGASTRODUODENOSCOPY (EGD) WITH PROPOFOL;  Surgeon: Carol Ada, MD;  Location: [facility];  Service: Endoscopy;  Laterality: N/A;  . FEMORAL HERNIA REPAIR    . IR IMAGING GUIDED PORT INSERTION  03/08/2019  . LEFT HEART CATH AND CORONARY ANGIOGRAPHY N/A 05/19/2017   Procedure: LEFT HEART CATH AND CORONARY ANGIOGRAPHY;  Surgeon: Leonie Man, MD;  Location: [place] [facility] LAB;  Service: Cardiovascular;  Laterality: N/A;  . SPHINCTEROTOMY  01/21/2019   Procedure: SPHINCTEROTOMY;  Surgeon: Carol Ada, MD;  Location: [facility];  Service: Endoscopy;;  . UPPER ESOPHAGEAL ENDOSCOPIC ULTRASOUND (EUS) N/A 01/21/2019   Procedure: UPPER ESOPHAGEAL ENDOSCOPIC ULTRASOUND (EUS);  Surgeon: Carol Ada, MD;  Location: [facility];  Service: Endoscopy;  Laterality: N/A;    I have reviewed the social history and family history with the patient and they are unchanged from previous note.  ALLERGIES:  is allergic to erythromycin.  MEDICATIONS:  Current Outpatient Medications  Medication Sig Dispense Refill  . amLODipine (NORVASC) 5 MG tablet Take 5 mg by mouth daily.     Marland Kitchen atenolol (TENORMIN) 25 MG tablet Take by mouth daily. Once a day    . atorvastatin (LIPITOR) 10 MG tablet Take  10 mg by mouth daily.    . Blood Glucose Monitoring Suppl (ACCU-CHEK NANO SMARTVIEW) W/DEVICE KIT 1 kit by Does not apply route 2 (two) times daily. (Patient taking differently: 1 kit by Does not apply route See admin instructions. Test blood sugars 12x's daily) 1 kit 0  . DiphenhydrAMINE HCl (ZZZQUIL) 50 MG/30ML LIQD Take 60 mLs by mouth at bedtime.    Marland Kitchen glucose blood test strip Test 3 times a day. (Patient  taking differently: 1 each by Other route See admin instructions. Test 12 times a day.) 300 each Prn  . insulin NPH Human (NOVOLIN N) 100 UNIT/ML injection Inject 10 Units into the skin 2 (two) times a day.    . insulin regular (NOVOLIN R) 100 units/mL injection Inject 10 Units into the skin 3 (three) times daily before meals.    . lidocaine-prilocaine (EMLA) cream Apply to affected area once 30 g 3  . lisinopril (ZESTRIL) 20 MG tablet Take 20 mg by mouth daily.     . magnesium oxide (MAG-OX) 400 (241.3 Mg) MG tablet Take 1 tablet (400 mg total) by mouth daily. 30 tablet 2  . metFORMIN (GLUCOPHAGE) 1000 MG tablet Take 2,000 mg by mouth every evening.     . Niacin CR (SLO-NIACIN) 750 MG TBCR Take 1,500 mg by mouth at bedtime.    Marland Kitchen omeprazole (PRILOSEC OTC) 20 MG tablet Take 20 mg by mouth daily.    . ondansetron (ZOFRAN) 8 MG tablet Take 1 tablet (8 mg total) by mouth 2 (two) times daily as needed. Start on the third day after chemotherapy. 30 tablet 1  . oxycodone (OXY-IR) 5 MG capsule Take 1 capsule (5 mg total) by mouth every 6 (six) hours as needed for pain. 30 capsule 0  . prochlorperazine (COMPAZINE) 10 MG tablet Take 1 tablet (10 mg total) by mouth every 6 (six) hours as needed (Nausea or vomiting). 30 tablet 1  . aspirin 325 MG tablet Take 325 mg by mouth at bedtime.      No current facility-administered medications for this visit.    Facility-Administered Medications Ordered in Other Visits  Medication Dose Route Frequency Provider Last Rate Last Dose  . CISplatin (PLATINOL) 45 mg in sodium chloride 0.9 % 250 mL chemo infusion  25 mg/m2 (Treatment Plan Recorded) Intravenous Once [name], MD      . dextrose 5 % and 0.45% NaCl 1,000 mL with magnesium sulfate 2 g infusion   Intravenous Once [name], MD 250 mL/hr at 03/31/19 1040    . fosaprepitant (EMEND) 150 mg, dexamethasone (DECADRON) 12 mg in sodium chloride 0.9 % 145 mL IVPB   Intravenous Once [name], MD      . gemcitabine  (GEMZAR) 1,444 mg in sodium chloride 0.9 % 250 mL chemo infusion  800 mg/m2 (Treatment Plan Recorded) Intravenous Once [name], MD      . heparin lock flush 100 unit/mL  500 Units Intracatheter Once PRN [name], MD      . PACLitaxel-protein bound (ABRAXANE) chemo infusion 175 mg  100 mg/m2 (Treatment Plan Recorded) Intravenous Once [name], MD      . palonosetron Leona Carry) injection 0.25 mg  0.25 mg Intravenous Once [name], MD      . sodium chloride flush (NS) 0.9 % injection 10 mL  10 mL Intracatheter PRN [name], MD        PHYSICAL EXAMINATION: ECOG PERFORMANCE STATUS: 1 - Symptomatic but completely ambulatory  Vitals:   03/31/19 4665  BP: 128/71  Pulse: 78  Resp: 18  Temp: 98 F (36.7 C)  SpO2: 100%   Filed Weights   03/31/19 0846  Weight: 150 lb 6.4 oz (68.2 kg)    GENERAL:alert, no distress and comfortable SKIN: skin color, texture, turgor are normal, no rashes or significant lesions EYES: normal, Conjunctiva are pink and non-injected, sclera clear  NECK: supple, thyroid normal size, non-tender, without nodularity LYMPH:  no palpable lymphadenopathy in the cervical, axillary  LUNGS: clear to auscultation and percussion with normal breathing effort HEART: regular rate & rhythm and no murmurs and no lower extremity edema ABDOMEN:abdomen soft, non-tender and normal bowel sounds Musculoskeletal:no cyanosis of digits and no clubbing  NEURO: alert & oriented x 3 with fluent speech, no focal motor/sensory deficits  LABORATORY DATA:  I have reviewed the data as listed CBC Latest Ref Rng & Units 03/31/2019 03/19/2019 03/11/2019  WBC 4.0 - 10.5 K/uL 5.9 7.8 10.2  Hemoglobin 13.0 - 17.0 g/dL 10.3(L) 11.6(L) 12.0(L)  Hematocrit 39.0 - 52.0 % 31.8(L) 34.7(L) 35.3(L)  Platelets 150 - 400 K/uL 370 182 245     CMP Latest Ref Rng & Units 03/31/2019 03/19/2019 03/11/2019  Glucose 70 - 99 mg/dL 252(H) 239(H) 156(H)  BUN 8 - 23 mg/dL _0 Creatinine 0.61 - 1.24 mg/dL 1.37(H)  1.22 1.05  Sodium 135 - 145 mmol/L 138 134(L) 138  Potassium 3.5 - 5.1 mmol/L 5.3(H) 4.7 4.1  Chloride 98 - 111 mmol/L 103 99 102  CO2 22 - 32 mmol/L _1 Calcium 8.9 - 10.3 mg/dL 8.7(L) 9.4 9.5  Total Protein 6.5 - 8.1 g/dL 6.7 7.0 7.0  Total Bilirubin 0.3 - 1.2 mg/dL 0.3 0.8 1.2  Alkaline Phos 38 - 126 U/L 89 76 80  AST 15 - 41 U/L 45(H) 18 20  ALT 0 - 44 U/L 53(H) 24 20      RADIOGRAPHIC STUDIES: I have personally reviewed the radiological images as listed and agreed with the findings in the report. No results found.   ASSESSMENT & PLAN:  Christopher Welch is a 73 y.o. male with   1. Extrahepatic cholangiocarcinoma  -He was recently diagnosed in 12/2018. His work up revealed malignant stricture and CBD obstruction required stenting. Brushing revealed adenocarcinoma. His extrahepatic cholangiocarcinoma was not resectable due to vascular invasion (surgeon - Dr. Zenia Resides at [facility]) -He was referred to our clinic to initiate chemotherapy in the neoadjuvant setting. I recommended gemcitabine and cisplatin for cycle 1, on days 1 and 8 every 21 days. He tolerated well overall with fatigue and mild nausea. Will add Abraxane with cycle 2 based on phase 2 data. He started chemo on 03/12/19 -He will follow-up at the [facility] after 3 months of chemo with restaging scan. -Labs reviewed, CBC and CMP WNL except Hg 10.3, Cr 1.37. Mag 2.2. CA 19.9 still pending. Overall adequate to proceed with C2D1 Gemcitabine/Cisplatin with Abraxane today.  -I reviewed side effects of all 3 drugs. We discussed that he will likely have more fatigue, low blood counts etc from adding Abraxane.  He understands and is agreeable to proceed. Will add GCSF to cycle 2 on day 9 for possible neutropenia. I instructed him to take Claritin daily for 5 days starting day of injection to help prevent bone pain.  -F/u next week for C2D8  2. Weight loss -secondary to malignancy  -he presented with 20 lbs weight loss  -We reviewed  nutrition support with Glucerna -Continue to f/u with dietician  -  His appetite is adequate and able to gain weight.   3. DM -managed by PCP Dr. Theda Sers -he has had DM for 20 years, he is compliant with regimen and knows how to adjust BG -on insulin, he knows how to adjust dose based on his BG level, but overall not well controlled, I encourage him to f/u with Dr. Theda Sers    4. CAD, HL, HTN -followed by cardiologist Dr. Ellyn Hack -on amlodipine, atenolol, lisinopril, and statin  -HTN well controlled   5. Social support -he is single, no children, he lives alone -He has a good friend who can help him and take him to appointments  -I referred him to SW to assess needs - he may benefit from talking with Edwyna Shell about his stress about his diagnosis and treatment  6. Trouble sleeping  -He is able to fall asleep but has trouble staying asleep -He has tried OTC Z-quil which only moderately help.  -If not improving or worsens, I will call in Ambien.   7. Increased Cr -Cr slightly increased since last chemo, possible related to cisplatin or dehydration -he will get hydration with chemo  -I again encourage him to drink more water   PLAN: -Labs reviewed and adequate to proceed with C2D1 Cisplatin/Gemcitabine with Abraxane today  -Add GCSF to day 9 cycle 2.  -Lab, flush, f/u and chemo next week     No problem-specific Assessment & Plan notes found for this encounter.   No orders of the defined types were placed in this encounter.  All questions were answered. The patient knows to call the clinic with any problems, questions or concerns. No barriers to learning was detected. I spent 20 minutes counseling the patient face to face. The total time spent in the appointment was 25 minutes and more than 50% was on counseling and review of test results     [name], MD 03/31/2019   I, Joslyn Devon, am acting as scribe for [name], MD.   I have reviewed the above  documentation for accuracy and completeness, and I agree with the above.

## 2019-03-31 ENCOUNTER — Telehealth: Payer: Self-pay | Admitting: Hematology

## 2019-03-31 ENCOUNTER — Other Ambulatory Visit: Payer: Self-pay

## 2019-03-31 ENCOUNTER — Inpatient Hospital Stay: Payer: Medicare Other

## 2019-03-31 ENCOUNTER — Inpatient Hospital Stay: Payer: Medicare Other | Attending: Hematology

## 2019-03-31 ENCOUNTER — Encounter: Payer: Self-pay | Admitting: Hematology

## 2019-03-31 ENCOUNTER — Inpatient Hospital Stay (HOSPITAL_BASED_OUTPATIENT_CLINIC_OR_DEPARTMENT_OTHER): Payer: Medicare Other | Admitting: Hematology

## 2019-03-31 VITALS — BP 128/71 | HR 78 | Temp 98.0°F | Resp 18 | Ht 68.0 in | Wt 150.4 lb

## 2019-03-31 DIAGNOSIS — Z881 Allergy status to other antibiotic agents status: Secondary | ICD-10-CM | POA: Insufficient documentation

## 2019-03-31 DIAGNOSIS — I10 Essential (primary) hypertension: Secondary | ICD-10-CM | POA: Insufficient documentation

## 2019-03-31 DIAGNOSIS — C240 Malignant neoplasm of extrahepatic bile duct: Secondary | ICD-10-CM | POA: Insufficient documentation

## 2019-03-31 DIAGNOSIS — E785 Hyperlipidemia, unspecified: Secondary | ICD-10-CM | POA: Insufficient documentation

## 2019-03-31 DIAGNOSIS — E875 Hyperkalemia: Secondary | ICD-10-CM | POA: Insufficient documentation

## 2019-03-31 DIAGNOSIS — Z794 Long term (current) use of insulin: Secondary | ICD-10-CM

## 2019-03-31 DIAGNOSIS — R634 Abnormal weight loss: Secondary | ICD-10-CM | POA: Insufficient documentation

## 2019-03-31 DIAGNOSIS — Z5112 Encounter for antineoplastic immunotherapy: Secondary | ICD-10-CM | POA: Insufficient documentation

## 2019-03-31 DIAGNOSIS — B181 Chronic viral hepatitis B without delta-agent: Secondary | ICD-10-CM | POA: Diagnosis not present

## 2019-03-31 DIAGNOSIS — Z5111 Encounter for antineoplastic chemotherapy: Secondary | ICD-10-CM | POA: Insufficient documentation

## 2019-03-31 DIAGNOSIS — I2510 Atherosclerotic heart disease of native coronary artery without angina pectoris: Secondary | ICD-10-CM

## 2019-03-31 DIAGNOSIS — C221 Intrahepatic bile duct carcinoma: Secondary | ICD-10-CM

## 2019-03-31 DIAGNOSIS — R590 Localized enlarged lymph nodes: Secondary | ICD-10-CM | POA: Insufficient documentation

## 2019-03-31 DIAGNOSIS — Z79899 Other long term (current) drug therapy: Secondary | ICD-10-CM | POA: Insufficient documentation

## 2019-03-31 DIAGNOSIS — I700 Atherosclerosis of aorta: Secondary | ICD-10-CM | POA: Insufficient documentation

## 2019-03-31 DIAGNOSIS — G4700 Insomnia, unspecified: Secondary | ICD-10-CM | POA: Diagnosis not present

## 2019-03-31 DIAGNOSIS — Z5189 Encounter for other specified aftercare: Secondary | ICD-10-CM | POA: Insufficient documentation

## 2019-03-31 DIAGNOSIS — M25559 Pain in unspecified hip: Secondary | ICD-10-CM | POA: Diagnosis not present

## 2019-03-31 DIAGNOSIS — E119 Type 2 diabetes mellitus without complications: Secondary | ICD-10-CM

## 2019-03-31 DIAGNOSIS — R7989 Other specified abnormal findings of blood chemistry: Secondary | ICD-10-CM | POA: Insufficient documentation

## 2019-03-31 DIAGNOSIS — Z95828 Presence of other vascular implants and grafts: Secondary | ICD-10-CM

## 2019-03-31 LAB — CMP (CANCER CENTER ONLY)
ALT: 53 U/L — ABNORMAL HIGH (ref 0–44)
AST: 45 U/L — ABNORMAL HIGH (ref 15–41)
Albumin: 3.1 g/dL — ABNORMAL LOW (ref 3.5–5.0)
Alkaline Phosphatase: 89 U/L (ref 38–126)
Anion gap: 10 (ref 5–15)
BUN: 14 mg/dL (ref 8–23)
CO2: 25 mmol/L (ref 22–32)
Calcium: 8.7 mg/dL — ABNORMAL LOW (ref 8.9–10.3)
Chloride: 103 mmol/L (ref 98–111)
Creatinine: 1.37 mg/dL — ABNORMAL HIGH (ref 0.61–1.24)
GFR, Est AFR Am: 60 mL/min — ABNORMAL LOW
GFR, Estimated: 52 mL/min — ABNORMAL LOW
Glucose, Bld: 252 mg/dL — ABNORMAL HIGH (ref 70–99)
Potassium: 5.3 mmol/L — ABNORMAL HIGH (ref 3.5–5.1)
Sodium: 138 mmol/L (ref 135–145)
Total Bilirubin: 0.3 mg/dL (ref 0.3–1.2)
Total Protein: 6.7 g/dL (ref 6.5–8.1)

## 2019-03-31 LAB — CBC WITH DIFFERENTIAL (CANCER CENTER ONLY)
Abs Immature Granulocytes: 0.04 10*3/uL (ref 0.00–0.07)
Basophils Absolute: 0 10*3/uL (ref 0.0–0.1)
Basophils Relative: 1 %
Eosinophils Absolute: 0.1 10*3/uL (ref 0.0–0.5)
Eosinophils Relative: 1 %
HCT: 31.8 % — ABNORMAL LOW (ref 39.0–52.0)
Hemoglobin: 10.3 g/dL — ABNORMAL LOW (ref 13.0–17.0)
Immature Granulocytes: 1 %
Lymphocytes Relative: 32 %
Lymphs Abs: 1.9 10*3/uL (ref 0.7–4.0)
MCH: 30.5 pg (ref 26.0–34.0)
MCHC: 32.4 g/dL (ref 30.0–36.0)
MCV: 94.1 fL (ref 80.0–100.0)
Monocytes Absolute: 1.1 10*3/uL — ABNORMAL HIGH (ref 0.1–1.0)
Monocytes Relative: 18 %
Neutro Abs: 2.8 10*3/uL (ref 1.7–7.7)
Neutrophils Relative %: 47 %
Platelet Count: 370 10*3/uL (ref 150–400)
RBC: 3.38 MIL/uL — ABNORMAL LOW (ref 4.22–5.81)
RDW: 12.9 % (ref 11.5–15.5)
WBC Count: 5.9 10*3/uL (ref 4.0–10.5)
nRBC: 0 % (ref 0.0–0.2)

## 2019-03-31 LAB — MAGNESIUM: Magnesium: 2.2 mg/dL (ref 1.7–2.4)

## 2019-03-31 MED ORDER — SODIUM CHLORIDE 0.9 % IV SOLN
800.0000 mg/m2 | Freq: Once | INTRAVENOUS | Status: AC
  Administered 2019-03-31: 1444 mg via INTRAVENOUS
  Filled 2019-03-31: qty 37.98

## 2019-03-31 MED ORDER — PALONOSETRON HCL INJECTION 0.25 MG/5ML
0.2500 mg | Freq: Once | INTRAVENOUS | Status: AC
  Administered 2019-03-31: 0.25 mg via INTRAVENOUS

## 2019-03-31 MED ORDER — HEPARIN SOD (PORK) LOCK FLUSH 100 UNIT/ML IV SOLN
500.0000 [IU] | Freq: Once | INTRAVENOUS | Status: AC | PRN
  Administered 2019-03-31: 500 [IU]
  Filled 2019-03-31: qty 5

## 2019-03-31 MED ORDER — SODIUM CHLORIDE 0.9% FLUSH
10.0000 mL | INTRAVENOUS | Status: DC | PRN
  Administered 2019-03-31: 18:00:00 10 mL
  Filled 2019-03-31: qty 10

## 2019-03-31 MED ORDER — SODIUM CHLORIDE 0.9 % IV SOLN
Freq: Once | INTRAVENOUS | Status: AC
  Administered 2019-03-31: 13:00:00 via INTRAVENOUS
  Filled 2019-03-31: qty 5

## 2019-03-31 MED ORDER — PACLITAXEL PROTEIN-BOUND CHEMO INJECTION 100 MG
100.0000 mg/m2 | Freq: Once | INTRAVENOUS | Status: AC
  Administered 2019-03-31: 175 mg via INTRAVENOUS
  Filled 2019-03-31: qty 35

## 2019-03-31 MED ORDER — PALONOSETRON HCL INJECTION 0.25 MG/5ML
INTRAVENOUS | Status: AC
  Filled 2019-03-31: qty 5

## 2019-03-31 MED ORDER — ALTEPLASE 2 MG IJ SOLR
INTRAMUSCULAR | Status: AC
  Filled 2019-03-31: qty 2

## 2019-03-31 MED ORDER — SODIUM CHLORIDE 0.9 % IV SOLN
25.0000 mg/m2 | Freq: Once | INTRAVENOUS | Status: AC
  Administered 2019-03-31: 45 mg via INTRAVENOUS
  Filled 2019-03-31: qty 45

## 2019-03-31 MED ORDER — ALTEPLASE 2 MG IJ SOLR
2.0000 mg | Freq: Once | INTRAMUSCULAR | Status: AC | PRN
  Administered 2019-03-31: 2 mg
  Filled 2019-03-31: qty 2

## 2019-03-31 MED ORDER — MAGNESIUM SULFATE 50 % IJ SOLN
Freq: Once | INTRAVENOUS | Status: AC
  Administered 2019-03-31: 11:00:00 via INTRAVENOUS
  Filled 2019-03-31: qty 1000

## 2019-03-31 MED ORDER — SODIUM CHLORIDE 0.9% FLUSH
10.0000 mL | INTRAVENOUS | Status: DC | PRN
  Administered 2019-03-31: 10 mL
  Filled 2019-03-31: qty 10

## 2019-03-31 MED ORDER — SODIUM CHLORIDE 0.9 % IV SOLN
Freq: Once | INTRAVENOUS | Status: AC
  Administered 2019-03-31: 10:00:00 via INTRAVENOUS
  Filled 2019-03-31: qty 250

## 2019-03-31 NOTE — Patient Instructions (Signed)
[place] Discharge Instructions for Patients Receiving Chemotherapy  Today you received the following chemotherapy agents: paclitaxel protein-bound (Abraxane), cisplatin (Platinol), gemcitabine (Gemzar).   To help prevent nausea and vomiting after your treatment, we encourage you to take your nausea medication as prescribed.   If you develop nausea and vomiting that is not controlled by your nausea medication, call the clinic.   BELOW ARE SYMPTOMS THAT SHOULD BE REPORTED IMMEDIATELY:  *FEVER GREATER THAN 100.5 F  *CHILLS WITH OR WITHOUT FEVER  NAUSEA AND VOMITING THAT IS NOT CONTROLLED WITH YOUR NAUSEA MEDICATION  *UNUSUAL SHORTNESS OF BREATH  *UNUSUAL BRUISING OR BLEEDING  TENDERNESS IN MOUTH AND THROAT WITH OR WITHOUT PRESENCE OF ULCERS  *URINARY PROBLEMS  *BOWEL PROBLEMS  UNUSUAL RASH Items with * indicate a potential emergency and should be followed up as soon as possible.  Feel free to call the clinic should you have any questions or concerns. The clinic phone number is [phone].  Please show the [place] at check-in to the Emergency Department and triage nurse.  Nanoparticle Albumin-Bound Paclitaxel injection What is this medicine? NANOPARTICLE ALBUMIN-BOUND PACLITAXEL (Na no PAHR ti kuhl al BYOO muhn-bound PAK li TAX el) is a chemotherapy drug. It targets fast dividing cells, like cancer cells, and causes these cells to die. This medicine is used to treat advanced breast cancer, lung cancer, and pancreatic cancer. This medicine may be used for other purposes; ask your health care provider or pharmacist if you have questions. COMMON BRAND NAME(S): Abraxane What should I tell my health care provider before I take this medicine? They need to know if you have any of these conditions:  kidney disease  liver disease  low blood counts, like low white cell, platelet, or red cell counts  lung or breathing disease, like asthma  tingling of  the fingers or toes, or other nerve disorder  an unusual or allergic reaction to paclitaxel, albumin, other chemotherapy, other medicines, foods, dyes, or preservatives  pregnant or trying to get pregnant  breast-feeding How should I use this medicine? This drug is given as an infusion into a vein. It is administered in a hospital or clinic by a specially trained health care professional. Talk to your pediatrician regarding the use of this medicine in children. Special care may be needed. Overdosage: If you think you have taken too much of this medicine contact a poison control center or emergency room at once. NOTE: This medicine is only for you. Do not share this medicine with others. What if I miss a dose? It is important not to miss your dose. Call your doctor or health care professional if you are unable to keep an appointment. What may interact with this medicine? This medicine may interact with the following medications:  antiviral medicines for hepatitis, HIV or AIDS  certain antibiotics like erythromycin and clarithromycin  certain medicines for fungal infections like ketoconazole and itraconazole  certain medicines for seizures like carbamazepine, phenobarbital, phenytoin  gemfibrozil  nefazodone  rifampin  St. John's wort This list may not describe all possible interactions. Give your health care provider a list of all the medicines, herbs, non-prescription drugs, or dietary supplements you use. Also tell them if you smoke, drink alcohol, or use illegal drugs. Some items may interact with your medicine. What should I watch for while using this medicine? Your condition will be monitored carefully while you are receiving this medicine. You will need important blood work done while you are taking this  medicine. This medicine can cause serious allergic reactions. If you experience allergic reactions like skin rash, itching or hives, swelling of the face, lips, or tongue,  tell your doctor or health care professional right away. In some cases, you may be given additional medicines to help with side effects. Follow all directions for their use. This drug may make you feel generally unwell. This is not uncommon, as chemotherapy can affect healthy cells as well as cancer cells. Report any side effects. Continue your course of treatment even though you feel ill unless your doctor tells you to stop. Call your doctor or health care professional for advice if you get a fever, chills or sore throat, or other symptoms of a cold or flu. Do not treat yourself. This drug decreases your body's ability to fight infections. Try to avoid being around people who are sick. This medicine may increase your risk to bruise or bleed. Call your doctor or health care professional if you notice any unusual bleeding. Be careful brushing and flossing your teeth or using a toothpick because you may get an infection or bleed more easily. If you have any dental work done, tell your dentist you are receiving this medicine. Avoid taking products that contain aspirin, acetaminophen, ibuprofen, naproxen, or ketoprofen unless instructed by your doctor. These medicines may hide a fever. Do not become pregnant while taking this medicine or for 6 months after stopping it. Women should inform their doctor if they wish to become pregnant or think they might be pregnant. Men should not father a child while taking this medicine or for 3 months after stopping it. There is a potential for serious side effects to an unborn child. Talk to your health care professional or pharmacist for more information. Do not breast-feed an infant while taking this medicine or for 2 weeks after stopping it. This medicine may interfere with the ability to get pregnant or to father a child. You should talk to your doctor or health care professional if you are concerned about your fertility. What side effects may I notice from receiving  this medicine? Side effects that you should report to your doctor or health care professional as soon as possible:  allergic reactions like skin rash, itching or hives, swelling of the face, lips, or tongue  breathing problems  changes in vision  fast, irregular heartbeat  low blood pressure  mouth sores  pain, tingling, numbness in the hands or feet  signs of decreased platelets or bleeding - bruising, pinpoint red spots on the skin, black, tarry stools, blood in the urine  signs of decreased red blood cells - unusually weak or tired, feeling faint or lightheaded, falls  signs of infection - fever or chills, cough, sore throat, pain or difficulty passing urine  signs and symptoms of liver injury like dark yellow or brown urine; general ill feeling or flu-like symptoms; light-colored stools; loss of appetite; nausea; right upper belly pain; unusually weak or tired; yellowing of the eyes or skin  swelling of the ankles, feet, hands  unusually slow heartbeat Side effects that usually do not require medical attention (report to your doctor or health care professional if they continue or are bothersome):  diarrhea  hair loss  loss of appetite  nausea, vomiting  tiredness This list may not describe all possible side effects. Call your doctor for medical advice about side effects. You may report side effects to FDA at 1-800-FDA-1088. Where should I keep my medicine? This drug is given in  a hospital or clinic and will not be stored at home. NOTE: This sheet is a summary. It may not cover all possible information. If you have questions about this medicine, talk to your doctor, pharmacist, or health care provider.  2020 Elsevier/Gold Standard (2017-05-20 13:03:45)

## 2019-03-31 NOTE — Progress Notes (Signed)
Spoke w/ Dr. Burr Medico - potassium level = 5.3 and Mg level = 2.2 today. Remove potassium from cisplatin fluids today and decrease magnesium to 2 grams (patient usually gets 4 grams).   Demetrius Charity, PharmD, [facility] Pharmacist Pharmacy Phone: [phone] 03/31/2019

## 2019-03-31 NOTE — Telephone Encounter (Signed)
Scheduled appt per 7/1 los.

## 2019-04-01 ENCOUNTER — Other Ambulatory Visit: Payer: Self-pay | Admitting: Hematology

## 2019-04-01 ENCOUNTER — Encounter: Payer: Self-pay | Admitting: Hematology

## 2019-04-01 ENCOUNTER — Telehealth: Payer: Self-pay

## 2019-04-01 LAB — CANCER ANTIGEN 19-9: CA 19-9: 131 U/mL — ABNORMAL HIGH (ref 0–35)

## 2019-04-01 MED ORDER — ZOLPIDEM TARTRATE 5 MG PO TABS: 5.0000 mg | ORAL_TABLET | Freq: Every evening | ORAL | 0 refills | Status: DC | PRN

## 2019-04-01 NOTE — Telephone Encounter (Signed)
-----   Message from Teodoro Spray, RN sent at 03/31/2019  2:31 PM EDT ----- Regarding: Dr. Burr Medico First time chemo followup Dr. Burr Medico, first time abraxane. Pt tolerated well. Thank you.

## 2019-04-01 NOTE — Telephone Encounter (Signed)
Called patient for chemo follow up, patient has been to work today, denies any ill side effects from abraxane.  No N/V/D.  Instructed to call us if he has any concerns, he verbalized an understanding.

## 2019-04-08 ENCOUNTER — Inpatient Hospital Stay (HOSPITAL_BASED_OUTPATIENT_CLINIC_OR_DEPARTMENT_OTHER): Payer: Medicare Other | Admitting: Nurse Practitioner

## 2019-04-08 ENCOUNTER — Inpatient Hospital Stay: Payer: Medicare Other | Admitting: Nutrition

## 2019-04-08 ENCOUNTER — Telehealth: Payer: Self-pay | Admitting: Nurse Practitioner

## 2019-04-08 ENCOUNTER — Inpatient Hospital Stay: Payer: Medicare Other

## 2019-04-08 ENCOUNTER — Telehealth: Payer: Self-pay | Admitting: Nutrition

## 2019-04-08 ENCOUNTER — Other Ambulatory Visit: Payer: Self-pay

## 2019-04-08 ENCOUNTER — Encounter: Payer: Self-pay | Admitting: Nurse Practitioner

## 2019-04-08 VITALS — BP 131/74 | HR 78 | Temp 98.0°F | Resp 18 | Ht 68.0 in | Wt 151.3 lb

## 2019-04-08 DIAGNOSIS — I10 Essential (primary) hypertension: Secondary | ICD-10-CM

## 2019-04-08 DIAGNOSIS — C221 Intrahepatic bile duct carcinoma: Secondary | ICD-10-CM

## 2019-04-08 DIAGNOSIS — R590 Localized enlarged lymph nodes: Secondary | ICD-10-CM | POA: Diagnosis not present

## 2019-04-08 DIAGNOSIS — C240 Malignant neoplasm of extrahepatic bile duct: Secondary | ICD-10-CM | POA: Diagnosis not present

## 2019-04-08 DIAGNOSIS — E785 Hyperlipidemia, unspecified: Secondary | ICD-10-CM | POA: Diagnosis not present

## 2019-04-08 DIAGNOSIS — M25559 Pain in unspecified hip: Secondary | ICD-10-CM | POA: Diagnosis not present

## 2019-04-08 DIAGNOSIS — B181 Chronic viral hepatitis B without delta-agent: Secondary | ICD-10-CM

## 2019-04-08 DIAGNOSIS — I2510 Atherosclerotic heart disease of native coronary artery without angina pectoris: Secondary | ICD-10-CM | POA: Diagnosis not present

## 2019-04-08 DIAGNOSIS — E119 Type 2 diabetes mellitus without complications: Secondary | ICD-10-CM

## 2019-04-08 DIAGNOSIS — Z79899 Other long term (current) drug therapy: Secondary | ICD-10-CM

## 2019-04-08 DIAGNOSIS — Z794 Long term (current) use of insulin: Secondary | ICD-10-CM

## 2019-04-08 DIAGNOSIS — Z5189 Encounter for other specified aftercare: Secondary | ICD-10-CM | POA: Diagnosis not present

## 2019-04-08 DIAGNOSIS — Z5111 Encounter for antineoplastic chemotherapy: Secondary | ICD-10-CM | POA: Diagnosis not present

## 2019-04-08 DIAGNOSIS — I700 Atherosclerosis of aorta: Secondary | ICD-10-CM

## 2019-04-08 DIAGNOSIS — E875 Hyperkalemia: Secondary | ICD-10-CM

## 2019-04-08 DIAGNOSIS — Z95828 Presence of other vascular implants and grafts: Secondary | ICD-10-CM

## 2019-04-08 DIAGNOSIS — Z5112 Encounter for antineoplastic immunotherapy: Secondary | ICD-10-CM | POA: Diagnosis not present

## 2019-04-08 DIAGNOSIS — Z881 Allergy status to other antibiotic agents status: Secondary | ICD-10-CM | POA: Diagnosis not present

## 2019-04-08 LAB — CMP (CANCER CENTER ONLY)
ALT: 38 U/L (ref 0–44)
AST: 32 U/L (ref 15–41)
Albumin: 3.3 g/dL — ABNORMAL LOW (ref 3.5–5.0)
Alkaline Phosphatase: 83 U/L (ref 38–126)
Anion gap: 9 (ref 5–15)
BUN: 21 mg/dL (ref 8–23)
CO2: 27 mmol/L (ref 22–32)
Calcium: 9.3 mg/dL (ref 8.9–10.3)
Chloride: 103 mmol/L (ref 98–111)
Creatinine: 1.3 mg/dL — ABNORMAL HIGH (ref 0.61–1.24)
GFR, Est AFR Am: >60 mL/min (ref >60)
GFR, Estimated: 55 mL/min — ABNORMAL LOW (ref >60)
Glucose, Bld: 136 mg/dL — ABNORMAL HIGH (ref 70–99)
Potassium: 5.2 mmol/L — ABNORMAL HIGH (ref 3.5–5.1)
Sodium: 139 mmol/L (ref 135–145)
Total Bilirubin: 0.3 mg/dL (ref 0.3–1.2)
Total Protein: 7.1 g/dL (ref 6.5–8.1)

## 2019-04-08 LAB — CBC WITH DIFFERENTIAL (CANCER CENTER ONLY)
Abs Immature Granulocytes: 0.12 10*3/uL — ABNORMAL HIGH (ref 0.00–0.07)
Basophils Absolute: 0 10*3/uL (ref 0.0–0.1)
Basophils Relative: 1 %
Eosinophils Absolute: 0 10*3/uL (ref 0.0–0.5)
Eosinophils Relative: 0 %
HCT: 32.6 % — ABNORMAL LOW (ref 39.0–52.0)
Hemoglobin: 10.9 g/dL — ABNORMAL LOW (ref 13.0–17.0)
Immature Granulocytes: 2 %
Lymphocytes Relative: 32 %
Lymphs Abs: 1.9 10*3/uL (ref 0.7–4.0)
MCH: 30.4 pg (ref 26.0–34.0)
MCHC: 33.4 g/dL (ref 30.0–36.0)
MCV: 91.1 fL (ref 80.0–100.0)
Monocytes Absolute: 0.7 10*3/uL (ref 0.1–1.0)
Monocytes Relative: 11 %
Neutro Abs: 3.1 10*3/uL (ref 1.7–7.7)
Neutrophils Relative %: 54 %
Platelet Count: 302 10*3/uL (ref 150–400)
RBC: 3.58 MIL/uL — ABNORMAL LOW (ref 4.22–5.81)
RDW: 12.8 % (ref 11.5–15.5)
WBC Count: 5.8 10*3/uL (ref 4.0–10.5)
nRBC: 0 % (ref 0.0–0.2)

## 2019-04-08 LAB — MAGNESIUM: Magnesium: 1.9 mg/dL (ref 1.7–2.4)

## 2019-04-08 MED ORDER — SODIUM CHLORIDE 0.9 % IV SOLN
25.0000 mg/m2 | Freq: Once | INTRAVENOUS | Status: AC
  Administered 2019-04-08: 15:00:00 45 mg via INTRAVENOUS
  Filled 2019-04-08: qty 45

## 2019-04-08 MED ORDER — SODIUM CHLORIDE 0.9 % IV SOLN
Freq: Once | INTRAVENOUS | Status: AC
  Administered 2019-04-08: 13:00:00 via INTRAVENOUS
  Filled 2019-04-08: qty 5

## 2019-04-08 MED ORDER — SODIUM CHLORIDE 0.9% FLUSH
10.0000 mL | INTRAVENOUS | Status: DC | PRN
  Administered 2019-04-08: 10 mL
  Filled 2019-04-08: qty 10

## 2019-04-08 MED ORDER — MAGNESIUM SULFATE 50 % IJ SOLN
Freq: Once | INTRAVENOUS | Status: AC
  Administered 2019-04-08: 11:00:00 via INTRAVENOUS
  Filled 2019-04-08: qty 1000

## 2019-04-08 MED ORDER — PACLITAXEL PROTEIN-BOUND CHEMO INJECTION 100 MG
100.0000 mg/m2 | Freq: Once | INTRAVENOUS | Status: AC
  Administered 2019-04-08: 14:00:00 175 mg via INTRAVENOUS
  Filled 2019-04-08: qty 35

## 2019-04-08 MED ORDER — ALTEPLASE 2 MG IJ SOLR
INTRAMUSCULAR | Status: AC
  Filled 2019-04-08: qty 2

## 2019-04-08 MED ORDER — PALONOSETRON HCL INJECTION 0.25 MG/5ML
INTRAVENOUS | Status: AC
  Filled 2019-04-08: qty 5

## 2019-04-08 MED ORDER — SODIUM CHLORIDE 0.9 % IV SOLN
Freq: Once | INTRAVENOUS | Status: AC
  Administered 2019-04-08: 10:00:00 via INTRAVENOUS
  Filled 2019-04-08: qty 250

## 2019-04-08 MED ORDER — HEPARIN SOD (PORK) LOCK FLUSH 100 UNIT/ML IV SOLN
500.0000 [IU] | Freq: Once | INTRAVENOUS | Status: AC | PRN
  Administered 2019-04-08: 500 [IU]
  Filled 2019-04-08: qty 5

## 2019-04-08 MED ORDER — ALTEPLASE 2 MG IJ SOLR
2.0000 mg | Freq: Once | INTRAMUSCULAR | Status: AC | PRN
  Administered 2019-04-08: 09:00:00 2 mg
  Filled 2019-04-08: qty 2

## 2019-04-08 MED ORDER — PALONOSETRON HCL INJECTION 0.25 MG/5ML
0.2500 mg | Freq: Once | INTRAVENOUS | Status: AC
  Administered 2019-04-08: 13:00:00 0.25 mg via INTRAVENOUS

## 2019-04-08 MED ORDER — SODIUM CHLORIDE 0.9 % IV SOLN
800.0000 mg/m2 | Freq: Once | INTRAVENOUS | Status: AC
  Administered 2019-04-08: 16:00:00 1444 mg via INTRAVENOUS
  Filled 2019-04-08: qty 37.98

## 2019-04-08 MED ORDER — OXYCODONE HCL 5 MG PO CAPS: 5.0000 mg | ORAL_CAPSULE | Freq: Four times a day (QID) | ORAL | 0 refills | Status: DC | PRN

## 2019-04-08 MED ORDER — SODIUM CHLORIDE 0.9% FLUSH
10.0000 mL | INTRAVENOUS | Status: DC | PRN
  Administered 2019-04-08: 08:00:00 10 mL
  Filled 2019-04-08: qty 10

## 2019-04-08 NOTE — Progress Notes (Signed)
K = 5.2 Received call from Cira Rue, NP - remove KCl from IVF today. Orders updated. Left Mg as ordered at 4 g - see inf notes from 03/12/19. Kennith Center, Pharm.D., CPP 04/08/2019@9 :36 AM

## 2019-04-08 NOTE — Progress Notes (Signed)
10 cc blood return only. No blood return after. Labs were drawn peripherally. Tim Nichols,RN administered CATHFLOW.

## 2019-04-08 NOTE — Patient Instructions (Signed)
[facility] Discharge Instructions for Patients Receiving Chemotherapy  Today you received the following chemotherapy agents Abraxane/Cisplatin/Gemzar.  To help prevent nausea and vomiting after your treatment, we encourage you to take your nausea medication as directed.   If you develop nausea and vomiting that is not controlled by your nausea medication, call the clinic.   BELOW ARE SYMPTOMS THAT SHOULD BE REPORTED IMMEDIATELY:  *FEVER GREATER THAN 100.5 F  *CHILLS WITH OR WITHOUT FEVER  NAUSEA AND VOMITING THAT IS NOT CONTROLLED WITH YOUR NAUSEA MEDICATION  *UNUSUAL SHORTNESS OF BREATH  *UNUSUAL BRUISING OR BLEEDING  TENDERNESS IN MOUTH AND THROAT WITH OR WITHOUT PRESENCE OF ULCERS  *URINARY PROBLEMS  *BOWEL PROBLEMS  UNUSUAL RASH Items with * indicate a potential emergency and should be followed up as soon as possible.  Feel free to call the clinic should you have any questions or concerns. The clinic phone number is [phone].  Please show the [facility] at check-in to the Emergency Department and triage nurse.

## 2019-04-08 NOTE — Telephone Encounter (Signed)
Scheduled appt per 7/9 los. °

## 2019-04-08 NOTE — Telephone Encounter (Signed)
Contacted patient's home number and left message.

## 2019-04-08 NOTE — Progress Notes (Signed)
RD working remotely.  Nutrition follow up completed with patient receiving treatment for extrahepatic cholangiocarcinoma.  Weight increased to 151.3 pounds on July 9 from 147.8 on June 11. No edema noted. Labs reviewed: Glucose 136, Creatinine 1.3 and K 5.2 Patient reports good appetite. States he is drinking CIB and Boost 2-3 times daily. Reports drinking water, milk, Juice and soda in addition to water. Bowels moving normally. Experienced one brief episode of nausea for 10 min and then it went away.  Nutrition Diagnosis: Inadequate oral intake improved.  Intervention: Continue increased oral intake and CIB as tolerated. Continue adequate hydration. Questions answered, teach back method used.  Monitoring, Evaluation, Goals: Patient will tolerate increased oral intake to minimize weight loss.  Next Visit:Thursday, July 30 by telephone.

## 2019-04-08 NOTE — Progress Notes (Signed)
[name]   [phone] [phone]   Clinic Follow up Note   Patient Care Team: [name], DO as PCP - General (Family Medicine) 04/08/2019  CHIEF COMPLAINT: Follow-up extrahepatic cholangiocarcinoma  SUMMARY OF ONCOLOGIC HISTORY: Oncology History  Cholangiocarcinoma ([place])  01/14/2019 Imaging   US Abdomen 01/14/19  IMPRESSION: There is significant intrahepatic and extrahepatic biliary dilatation present, concerning for distal common bile duct obstruction. Further evaluation with CT scan or MRCP is recommended. Correlation with liver function tests is recommended as well. These results will be called to the ordering clinician or representative by the Radiologist Assistant, and communication documented in the PACS or zVision Dashboard.   Probable large amount of sludge seen within gallbladder lumen with mild gallbladder wall thickening. However, no cholelithiasis, pericholecystic fluid or sonographic Murphy's sign is noted.   4.2 cm septated cyst seen in upper pole of right kidney consistent with Bosniak type 2 lesion. Follow-up ultrasound in 1 year is recommended to ensure stability.   01/20/2019 Imaging   MRI abdomen 01/20/19  IMPRESSION: 1. Findings are highly concerning for central tumor in the biliary tract at the confluence of the common hepatic duct, cystic duct and common bile ducts. Further clinical evaluation for potential cholangiocarcinoma is strongly recommended. 2. Mild ductal dilatation of the main pancreatic duct throughout the distal body and tail of the pancreas where there is also some associated side branch ectasia. This may suggest a pancreatic ductal stricture. No obstructing pancreatic neoplasm identified. 3. Aortic atherosclerosis.   01/21/2019 Initial Biopsy   Diagnosis 01/21/19  BILE DUCT BRUSHING (SPECIMEN 1 OF 1 COLLECTED 01/21/2019) ADENOCARCINOMA.   01/21/2019 Procedure   ERCP By Dr hung  01/21/19 IMRPESSION - The major papilla appeared normal. - A single localized biliary stricture was found in the middle third of the main bile duct. - The entire main bile duct and upper third of the main bile duct were dilated, secondary to a stricture. - A biliary sphincterotomy was performed. - Cells for cytology obtained in the middle third of the main bile duct. - One plastic stent was placed into the common bile duct.  EUS by Dr hung 01/21/19  IMPRESSION - There was dilation in the middle third of the main bile duct and in the upper third of the main bile duct which measured up to 15 mm. - There was a suggestion of a stricture in the middle third of the main bile duct. - No specimens collected.   02/03/2019 Imaging   CT CAP at [facility] 02/03/19  Impression:  1. There is mild intrahepatic and extrahepatic biliary ductal dilation and diffuse common bile duct wall thickening with stent in place. There is abnormal soft tissue measuring approximately 1.6 cm between the common hepatic artery, portal vein and common bile duct, worrisome for the known cholangiocarcinoma.  2. Periportal lymphadenopathy which may represent nodal metastasis.  4. Marked pancreatic atrophy and duct dilation involving the distal body and tail the pancreas where there is a coarse calcification. These findings are favored to represent stenosis from intraductal stones though an underlying stricture cannot be completely excluded.  5. Atypical symmetric prominent fat stranding of the bilateral lower abdominal wall. Correlate with surgical history or history of history of trauma.   03/08/2019 Initial Diagnosis   Cholangiocarcinoma ([place])   03/12/2019 -  Chemotherapy   gemcitabine and cisplatin on days 1 and 8 every 21 days starting 03/12/19.    03/19/2019 Cancer Staging   Staging form: Perihilar Bile Ducts, AJCC  8th Edition - Clinical: Stage Unknown (cTX, cN0, cM0) - Signed by [name], MD on 03/19/2019      CURRENT THERAPY: gemcitabine and cisplatin on days 1 and 8 every 21 daysstarting 03/12/19. Abraxane added with cycle 2 S/p cycle 2-day 1 on 03/31/2019  INTERVAL HISTORY: Christopher Welch returns for follow-up and chemotherapy as scheduled.  He completed cycle 2-day 1 cisplatin/gemcitabine with addition of Abraxane on 03/31/2019.  He was mildly fatigued on day 2 and 3, but remained functional and able to work.  Appetite is good.  Drinks 3 large glasses of water per day.  Denies mucositis.  His mild intermittent abdominal pain is stable.  Takes Tylenol and oxycodone for more moderate pain.  Never exceed more than 1/day.  He takes antiemetics preventatively, had mild nausea once.  Denies vomiting, constipation, diarrhea.  Denies dysuria.  Denies any bleeding.  Denies fever, chills, cough, chest pain, dyspnea, leg edema, neuropathy.  He is sleeping much better with half tab Ambien.    MEDICAL HISTORY:  Past Medical History:  Diagnosis Date   Diabetes mellitus type I, controlled ([place])    Dyslipidemia    ED (erectile dysfunction)    Essential hypertension    Gilbert's disease    Hepatitis B carrier ([place])     SURGICAL HISTORY: Past Surgical History:  Procedure Laterality Date   APPENDECTOMY  1964   BILIARY BRUSHING  01/21/2019   Procedure: BILIARY BRUSHING;  Surgeon: Carol Ada, MD;  Location: [facility];  Service: Endoscopy;;   BILIARY STENT PLACEMENT N/A 01/21/2019   Procedure: BILIARY STENT PLACEMENT;  Surgeon: Carol Ada, MD;  Location: [facility];  Service: Endoscopy;  Laterality: N/A;   ENDOSCOPIC RETROGRADE CHOLANGIOPANCREATOGRAPHY (ERCP) WITH PROPOFOL N/A 01/21/2019   Procedure: ENDOSCOPIC RETROGRADE CHOLANGIOPANCREATOGRAPHY (ERCP) WITH PROPOFOL;  Surgeon: Carol Ada, MD;  Location: [facility];  Service: Endoscopy;  Laterality: N/A;   ESOPHAGOGASTRODUODENOSCOPY (EGD) WITH PROPOFOL N/A 01/21/2019   Procedure: ESOPHAGOGASTRODUODENOSCOPY (EGD) WITH PROPOFOL;  Surgeon:  Carol Ada, MD;  Location: [facility];  Service: Endoscopy;  Laterality: N/A;   FEMORAL HERNIA REPAIR     IR IMAGING GUIDED PORT INSERTION  03/08/2019   LEFT HEART CATH AND CORONARY ANGIOGRAPHY N/A 05/19/2017   Procedure: LEFT HEART CATH AND CORONARY ANGIOGRAPHY;  Surgeon: Leonie Man, MD;  Location: [facility];  Service: Cardiovascular;  Laterality: N/A;   SPHINCTEROTOMY  01/21/2019   Procedure: SPHINCTEROTOMY;  Surgeon: Carol Ada, MD;  Location: [facility];  Service: Endoscopy;;   UPPER ESOPHAGEAL ENDOSCOPIC ULTRASOUND (EUS) N/A 01/21/2019   Procedure: UPPER ESOPHAGEAL ENDOSCOPIC ULTRASOUND (EUS);  Surgeon: Carol Ada, MD;  Location: [facility];  Service: Endoscopy;  Laterality: N/A;    I have reviewed the social history and family history with the patient and they are unchanged from previous note.  ALLERGIES:  is allergic to erythromycin.  MEDICATIONS:  Current Outpatient Medications  Medication Sig Dispense Refill   amLODipine (NORVASC) 5 MG tablet Take 5 mg by mouth daily.      atenolol (TENORMIN) 25 MG tablet Take by mouth daily. Once a day     atorvastatin (LIPITOR) 10 MG tablet Take 10 mg by mouth daily.     Blood Glucose Monitoring Suppl (ACCU-CHEK NANO SMARTVIEW) W/DEVICE KIT 1 kit by Does not apply route 2 (two) times daily. (Patient taking differently: 1 kit by Does not apply route See admin instructions. Test blood sugars 12x's daily) 1 kit 0   DiphenhydrAMINE HCl (ZZZQUIL) 50 MG/30ML LIQD Take 60 mLs by mouth  at bedtime.     glucose blood test strip Test 3 times a day. (Patient taking differently: 1 each by Other route See admin instructions. Test 12 times a day.) 300 each Prn   insulin NPH Human (NOVOLIN N) 100 UNIT/ML injection Inject 10 Units into the skin 2 (two) times a day.     insulin regular (NOVOLIN R) 100 units/mL injection Inject 10 Units into the skin 3 (three) times daily before meals.     lidocaine-prilocaine (EMLA) cream  Apply to affected area once 30 g 3   lisinopril (ZESTRIL) 20 MG tablet Take 20 mg by mouth daily.      magnesium oxide (MAG-OX) 400 (241.3 Mg) MG tablet Take 1 tablet (400 mg total) by mouth daily. 30 tablet 2   metFORMIN (GLUCOPHAGE) 1000 MG tablet Take 2,000 mg by mouth every evening.      Niacin CR (SLO-NIACIN) 750 MG TBCR Take 1,500 mg by mouth at bedtime.     omeprazole (PRILOSEC OTC) 20 MG tablet Take 20 mg by mouth daily.     ondansetron (ZOFRAN) 8 MG tablet Take 1 tablet (8 mg total) by mouth 2 (two) times daily as needed. Start on the third day after chemotherapy. 30 tablet 1   oxycodone (OXY-IR) 5 MG capsule Take 1 capsule (5 mg total) by mouth every 6 (six) hours as needed for pain. 30 capsule 0   prochlorperazine (COMPAZINE) 10 MG tablet Take 1 tablet (10 mg total) by mouth every 6 (six) hours as needed (Nausea or vomiting). 30 tablet 1   zolpidem (AMBIEN) 5 MG tablet Take 1-2 tablets (5-10 mg total) by mouth at bedtime as needed for sleep. 60 tablet 0   aspirin 325 MG tablet Take 325 mg by mouth at bedtime.      No current facility-administered medications for this visit.    Facility-Administered Medications Ordered in Other Visits  Medication Dose Route Frequency Provider Last Rate Last Dose   CISplatin (PLATINOL) 45 mg in sodium chloride 0.9 % 250 mL chemo infusion  25 mg/m2 (Treatment Plan Recorded) Intravenous Once [name], MD       dextrose 5 % and 0.45% NaCl 1,000 mL with magnesium sulfate 4 g infusion   Intravenous Once [name], MD 250 mL/hr at 04/08/19 1037     fosaprepitant (EMEND) 150 mg, dexamethasone (DECADRON) 12 mg in sodium chloride 0.9 % 145 mL IVPB   Intravenous Once [name], MD 454 mL/hr at 04/08/19 1245     gemcitabine (GEMZAR) 1,444 mg in sodium chloride 0.9 % 250 mL chemo infusion  800 mg/m2 (Treatment Plan Recorded) Intravenous Once [name], MD       heparin lock flush 100 unit/mL  500 Units Intracatheter Once PRN [name], MD        PACLitaxel-protein bound (ABRAXANE) chemo infusion 175 mg  100 mg/m2 (Treatment Plan Recorded) Intravenous Once [name], MD       sodium chloride flush (NS) 0.9 % injection 10 mL  10 mL Intracatheter PRN [name], MD        PHYSICAL EXAMINATION: ECOG PERFORMANCE STATUS: 0 - Asymptomatic  Vitals:   04/08/19 0852  BP: 131/74  Pulse: 78  Resp: 18  Temp: 98 F (36.7 C)  SpO2: 100%   Filed Weights   04/08/19 0852  Weight: 151 lb 4.8 oz (68.6 kg)    GENERAL:alert, no distress and comfortable SKIN: No obvious rash EYES: sclera clear LUNGS: Respirations even and unlabored HEART: no lower extremity edema  ABDOMEN: abdomen soft, non-tender.  No hepatomegaly.  Midline incision healing well, no drainage or erythema.  Some Steri-Strips intact Musculoskeletal:no cyanosis of digits NEURO: alert & oriented x 3 with fluent speech, normal gait PAC without erythema  LABORATORY DATA:  I have reviewed the data as listed CBC Latest Ref Rng & Units 04/08/2019 03/31/2019 03/19/2019  WBC 4.0 - 10.5 K/uL 5.8 5.9 7.8  Hemoglobin 13.0 - 17.0 g/dL 10.9(L) 10.3(L) 11.6(L)  Hematocrit 39.0 - 52.0 % 32.6(L) 31.8(L) 34.7(L)  Platelets 150 - 400 K/uL 302 370 182     CMP Latest Ref Rng & Units 04/08/2019 03/31/2019 03/19/2019  Glucose 70 - 99 mg/dL 136(H) 252(H) 239(H)  BUN 8 - 23 mg/dL 21 14 18   Creatinine 0.61 - 1.24 mg/dL 1.30(H) 1.37(H) 1.22  Sodium 135 - 145 mmol/L 139 138 134(L)  Potassium 3.5 - 5.1 mmol/L 5.2(H) 5.3(H) 4.7  Chloride 98 - 111 mmol/L 103 103 99  CO2 22 - 32 mmol/L 27 25 24   Calcium 8.9 - 10.3 mg/dL 9.3 8.7(L) 9.4  Total Protein 6.5 - 8.1 g/dL 7.1 6.7 7.0  Total Bilirubin 0.3 - 1.2 mg/dL 0.3 0.3 0.8  Alkaline Phos 38 - 126 U/L 83 89 76  AST 15 - 41 U/L 32 45(H) 18  ALT 0 - 44 U/L 38 53(H) 24      RADIOGRAPHIC STUDIES: I have personally reviewed the radiological images as listed and agreed with the findings in the report. No results found.   ASSESSMENT & PLAN: Christopher Welch  is a 73 y.o. male with   1. Extrahepatic cholangiocarcinoma -He was recentlydiagnosedin 12/2018. His work up revealed malignant stricture and CBD obstruction required stenting. Brushing revealed adenocarcinoma. His extrahepatic cholangiocarcinoma was not resectable due to vascular invasion(surgeon - Dr. Zenia Resides at [facility]) -He was referred to our clinic to initiate chemotherapy in the neoadjuvant setting.  - Dr. Burr Medico recommendedgemcitabine and cisplatin for cycle 1, on days 1 and 8 every 21 days. He tolerated well overall with fatigue and mild nausea. Abraxane was added with cycle 2 based on phase 2 data.He started chemo on 03/12/19 -He will follow-up at the [facility] after 3 months of chemo with restaging scan. -Mr. Larsh appears well, he completed cycle 2-day 1 cisplatin, gemcitabine in addition of Abraxane.  He tolerated very well with mild fatigue on days 2 and 3 and one episode of nausea, he has recovered well.  -Abdominal pain is stable, well managed on current regimen. He takes tylenol with oxycodone once per day usually at night. I refilled oxyIR today -Labs reviewed, Hgb 10.9, ANC and platelets normal.  K5.2 due to increased nutritional potassium intake.  Creatinine 1.3, improved from last week. Adequate to proceed with cycle 2 day 8. -he will return on day 9 for [name] injection; we reviewed potential side effects and how to manage bone pain with claritin -He is requesting second opinion with Dr. Barry Dienes. His case was previously discussed in GI tumor board. Will refer him -he will return in 2 weeks for f/u and cycle 3  2. Weight loss -secondary to malignancy  -he presented with 20 lbs weight loss  -We previously reviewed nutrition support withGlucerna -Continue to f/u with dietician -His appetite is good, he has gained weight on chemotherapy    3. DM -managed by PCP Dr. Theda Sers -he has had DM for 20 years, he is compliant with regimen and knows how to adjust BG -on insulin - f/u  with Dr. Theda Sers  -BG 136 today  4. CAD, HL, HTN -followed by cardiologist Dr. Ellyn Hack -on amlodipine, atenolol, lisinopril, and statin -BP normal and well controlled lately  5. Social support -he is single, no children, he lives alone -He has a good friend who can help him and take him to appointments  -he is tolerating chemo very well and is able to work some  6. Trouble sleeping  -Seroquel is not effective, Dr. Burr Medico previously called in Ambien - he takes half tablet is very effective  7. Increased Cr, hyperkalemia  -Cr slightly increased after first chemo, possibly related to cisplatin or dehydration -improved today 1.3 -I again encourage him to drink more water  -K 5.2, he is not on potassium supplement although he does eat a banana every day, I cautioned him to limit potassium intake -We will remove KCl from hydration fluids today, I reviewed pharmacy   Plan: -Labs reviewed, proceed with cycle 2-day 8 gemcitabine, cisplatin, Abraxane -Return for [name] on 04/09/2019, reviewed potential side effects and symptom management -K 5.2, remove potassium from hydration fluid.  I reviewed with pharmacy -Increase oral hydration -Patient requesting second opinion with Dr. Barry Dienes, I referred him  -Return in 2 weeks for follow-up and cycle 3  All questions were answered. The patient knows to call the clinic with any problems, questions or concerns. No barriers to learning was detected. I spent 20 minutes counseling the patient face to face. The total time spent in the appointment was 25 minutes and more than 50% was on counseling and review of test results     Alla Feeling, NP 04/08/19

## 2019-04-09 ENCOUNTER — Encounter: Payer: Self-pay | Admitting: Hematology

## 2019-04-09 ENCOUNTER — Inpatient Hospital Stay: Payer: Medicare Other

## 2019-04-10 ENCOUNTER — Inpatient Hospital Stay: Payer: Medicare Other

## 2019-04-10 ENCOUNTER — Other Ambulatory Visit: Payer: Self-pay

## 2019-04-10 VITALS — BP 134/77 | HR 77 | Temp 98.4°F | Resp 18

## 2019-04-10 DIAGNOSIS — R590 Localized enlarged lymph nodes: Secondary | ICD-10-CM | POA: Diagnosis not present

## 2019-04-10 DIAGNOSIS — Z5189 Encounter for other specified aftercare: Secondary | ICD-10-CM | POA: Diagnosis not present

## 2019-04-10 DIAGNOSIS — Z5111 Encounter for antineoplastic chemotherapy: Secondary | ICD-10-CM | POA: Diagnosis not present

## 2019-04-10 DIAGNOSIS — C240 Malignant neoplasm of extrahepatic bile duct: Secondary | ICD-10-CM | POA: Diagnosis not present

## 2019-04-10 DIAGNOSIS — C221 Intrahepatic bile duct carcinoma: Secondary | ICD-10-CM

## 2019-04-10 DIAGNOSIS — Z5112 Encounter for antineoplastic immunotherapy: Secondary | ICD-10-CM | POA: Diagnosis not present

## 2019-04-10 DIAGNOSIS — M25559 Pain in unspecified hip: Secondary | ICD-10-CM | POA: Diagnosis not present

## 2019-04-10 MED ORDER — PEGFILGRASTIM-CBQV 6 MG/0.6ML ~~LOC~~ SOSY
PREFILLED_SYRINGE | SUBCUTANEOUS | Status: AC
  Filled 2019-04-10: qty 0.6

## 2019-04-10 MED ORDER — PEGFILGRASTIM-CBQV 6 MG/0.6ML ~~LOC~~ SOSY
6.0000 mg | PREFILLED_SYRINGE | Freq: Once | SUBCUTANEOUS | Status: AC
  Administered 2019-04-10: 09:00:00 6 mg via SUBCUTANEOUS

## 2019-04-10 NOTE — Patient Instructions (Signed)

## 2019-04-14 NOTE — Progress Notes (Signed)
[facility]   [phone] Fax:([phone]) [phone]   Clinic Follow up Note   Patient Care Team: Christopher Morning, DO as PCP - General (Family Medicine)  Date of Service:  04/21/2019  CHIEF COMPLAINT: F/u of ExtrahepaticCholangiocarcinoma  SUMMARY OF ONCOLOGIC HISTORY: Oncology History  Cholangiocarcinoma ([place])  01/14/2019 Imaging   US Abdomen 01/14/19  IMPRESSION: There is significant intrahepatic and extrahepatic biliary dilatation present, concerning for distal common bile duct obstruction. Further evaluation with CT scan or MRCP is recommended. Correlation with liver function tests is recommended as well. These results will be called to the ordering clinician or representative by the Radiologist Assistant, and communication documented in the PACS or zVision Dashboard.   Probable large amount of sludge seen within gallbladder lumen with mild gallbladder wall thickening. However, no cholelithiasis, pericholecystic fluid or sonographic Murphy's sign is noted.   4.2 cm septated cyst seen in upper pole of right kidney consistent with Bosniak type 2 lesion. Follow-up ultrasound in 1 year is recommended to ensure stability.   01/20/2019 Imaging   MRI abdomen 01/20/19  IMPRESSION: 1. Findings are highly concerning for central tumor in the biliary tract at the confluence of the common hepatic duct, cystic duct and common bile ducts. Further clinical evaluation for potential cholangiocarcinoma is strongly recommended. 2. Mild ductal dilatation of the main pancreatic duct throughout the distal body and tail of the pancreas where there is also some associated side branch ectasia. This may suggest a pancreatic ductal stricture. No obstructing pancreatic neoplasm identified. 3. Aortic atherosclerosis.   01/21/2019 Initial Biopsy   Diagnosis 01/21/19  BILE DUCT BRUSHING (SPECIMEN 1 OF 1 COLLECTED 01/21/2019) ADENOCARCINOMA.   01/21/2019 Procedure   ERCP By Dr hung  01/21/19 IMRPESSION - The major papilla appeared normal. - A single localized biliary stricture was found in the middle third of the main bile duct. - The entire main bile duct and upper third of the main bile duct were dilated, secondary to a stricture. - A biliary sphincterotomy was performed. - Cells for cytology obtained in the middle third of the main bile duct. - One plastic stent was placed into the common bile duct.  EUS by Dr hung 01/21/19  IMPRESSION - There was dilation in the middle third of the main bile duct and in the upper third of the main bile duct which measured up to 15 mm. - There was a suggestion of a stricture in the middle third of the main bile duct. - No specimens collected.   02/03/2019 Imaging   CT CAP at [facility] 02/03/19  Impression:  1. There is mild intrahepatic and extrahepatic biliary ductal dilation and diffuse common bile duct wall thickening with stent in place. There is abnormal soft tissue measuring approximately 1.6 cm between the common hepatic artery, portal vein and common bile duct, worrisome for the known cholangiocarcinoma.  2. Periportal lymphadenopathy which may represent nodal metastasis.  4. Marked pancreatic atrophy and duct dilation involving the distal body and tail the pancreas where there is a coarse calcification. These findings are favored to represent stenosis from intraductal stones though an underlying stricture cannot be completely excluded.  5. Atypical symmetric prominent fat stranding of the bilateral lower abdominal wall. Correlate with surgical history or history of history of trauma.   03/08/2019 Initial Diagnosis   Cholangiocarcinoma ([place])   03/12/2019 -  Chemotherapy   gemcitabine and cisplatin on days 1 and 8 every 21 days starting 03/12/19. Abraxane added with cycle 2. Added GCSF [name]) to  day 9 starting cycle 2.    03/19/2019 Cancer Staging   Staging form: Perihilar Bile Ducts, AJCC 8th Edition - Clinical:  Stage Unknown (cTX, cN0, cM0) - Signed by [name], MD on 03/19/2019      CURRENT THERAPY:  gemcitabine and cisplatin on days 1 and 8 every 21 daysstarting 03/12/19. Abraxane added with cycle 2. Added GCSF [name]) to day 9 starting cycle 2.   INTERVAL HISTORY:  Christopher Welch is here for a follow up and treatment. She presents to the clinic alone. He notes a few minutes ago when talking to my nurse he had a mid abdominal spasm that lasted a minutes and went away. This has never happened before. He feels he tolerated his addition of Abraxane well to treatment with mild fatigue and 1 episode of nausea which did not last long.  He notes he tolerated the [name] well with mild hip pain. He notes Claritin helped him.    REVIEW OF SYSTEMS:   Constitutional: Denies fevers, chills or abnormal weight loss Eyes: Denies blurriness of vision Ears, nose, mouth, throat, and face: Denies mucositis or sore throat Respiratory: Denies cough, dyspnea or wheezes Cardiovascular: Denies palpitation, chest discomfort or lower extremity swelling Gastrointestinal:  Denies nausea, heartburn or change in bowel habits (+) 1 episode of mid abdominal spasm Skin: Denies abnormal skin rashes Lymphatics: Denies new lymphadenopathy or easy bruising Neurological:Denies numbness, tingling or new weaknesses Behavioral/Psych: Mood is stable, no new changes  All other systems were reviewed with the patient and are negative.  MEDICAL HISTORY:  Past Medical History:  Diagnosis Date  . Diabetes mellitus type I, controlled ([place])   . Dyslipidemia   . ED (erectile dysfunction)   . Essential hypertension   . Gilbert's disease   . Hepatitis B carrier [facility])     SURGICAL HISTORY: Past Surgical History:  Procedure Laterality Date  . APPENDECTOMY  1964  . BILIARY BRUSHING  01/21/2019   Procedure: BILIARY BRUSHING;  Surgeon: Carol Ada, MD;  Location: [facility];  Service: Endoscopy;;  . BILIARY STENT PLACEMENT N/A  01/21/2019   Procedure: BILIARY STENT PLACEMENT;  Surgeon: Carol Ada, MD;  Location: [facility];  Service: Endoscopy;  Laterality: N/A;  . ENDOSCOPIC RETROGRADE CHOLANGIOPANCREATOGRAPHY (ERCP) WITH PROPOFOL N/A 01/21/2019   Procedure: ENDOSCOPIC RETROGRADE CHOLANGIOPANCREATOGRAPHY (ERCP) WITH PROPOFOL;  Surgeon: Carol Ada, MD;  Location: [facility];  Service: Endoscopy;  Laterality: N/A;  . ESOPHAGOGASTRODUODENOSCOPY (EGD) WITH PROPOFOL N/A 01/21/2019   Procedure: ESOPHAGOGASTRODUODENOSCOPY (EGD) WITH PROPOFOL;  Surgeon: Carol Ada, MD;  Location: [facility];  Service: Endoscopy;  Laterality: N/A;  . FEMORAL HERNIA REPAIR    . IR IMAGING GUIDED PORT INSERTION  03/08/2019  . LEFT HEART CATH AND CORONARY ANGIOGRAPHY N/A 05/19/2017   Procedure: LEFT HEART CATH AND CORONARY ANGIOGRAPHY;  Surgeon: Leonie Man, MD;  Location: [facility];  Service: Cardiovascular;  Laterality: N/A;  . SPHINCTEROTOMY  01/21/2019   Procedure: SPHINCTEROTOMY;  Surgeon: Carol Ada, MD;  Location: [facility];  Service: Endoscopy;;  . UPPER ESOPHAGEAL ENDOSCOPIC ULTRASOUND (EUS) N/A 01/21/2019   Procedure: UPPER ESOPHAGEAL ENDOSCOPIC ULTRASOUND (EUS);  Surgeon: Carol Ada, MD;  Location: [facility];  Service: Endoscopy;  Laterality: N/A;    I have reviewed the social history and family history with the patient and they are unchanged from previous note.  ALLERGIES:  is allergic to erythromycin.  MEDICATIONS:  Current Outpatient Medications  Medication Sig Dispense Refill  . amLODipine (NORVASC) 5 MG tablet Take 5 mg by  mouth daily.     Marland Kitchen atenolol (TENORMIN) 25 MG tablet Take by mouth daily. Once a day    . atorvastatin (LIPITOR) 10 MG tablet Take 10 mg by mouth daily.    . Blood Glucose Monitoring Suppl (ACCU-CHEK NANO SMARTVIEW) W/DEVICE KIT 1 kit by Does not apply route 2 (two) times daily. (Patient taking differently: 1 kit by Does not apply route See admin instructions. Test blood  sugars 12x's daily) 1 kit 0  . DiphenhydrAMINE HCl (ZZZQUIL) 50 MG/30ML LIQD Take 60 mLs by mouth at bedtime.    Marland Kitchen glucose blood test strip Test 3 times a day. (Patient taking differently: 1 each by Other route See admin instructions. Test 12 times a day.) 300 each Prn  . insulin NPH Human (NOVOLIN N) 100 UNIT/ML injection Inject 10 Units into the skin 2 (two) times a day.    . insulin regular (NOVOLIN R) 100 units/mL injection Inject 10 Units into the skin 3 (three) times daily before meals.    . lidocaine-prilocaine (EMLA) cream Apply to affected area once 30 g 3  . lisinopril (ZESTRIL) 20 MG tablet Take 20 mg by mouth daily.     . magnesium oxide (MAG-OX) 400 (241.3 Mg) MG tablet Take 1 tablet (400 mg total) by mouth daily. 30 tablet 2  . metFORMIN (GLUCOPHAGE) 1000 MG tablet Take 2,000 mg by mouth every evening.     . Niacin CR (SLO-NIACIN) 750 MG TBCR Take 1,500 mg by mouth at bedtime.    Marland Kitchen omeprazole (PRILOSEC OTC) 20 MG tablet Take 20 mg by mouth daily.    . ondansetron (ZOFRAN) 8 MG tablet TAKE 1 TABLET BY MOUTH 2 TIMES DAY AS NEEDED. START ON 3RD DAY AFTER CHEMOTHERAPY 30 tablet 1  . oxycodone (OXY-IR) 5 MG capsule Take 1 capsule (5 mg total) by mouth every 6 (six) hours as needed for pain. 30 capsule 0  . prochlorperazine (COMPAZINE) 10 MG tablet TAKE 1 TABLET (10 MG TOTAL) BY MOUTH EVERY 6 (SIX) HOURS AS NEEDED (NAUSEA OR VOMITING). 30 tablet 1  . zolpidem (AMBIEN) 5 MG tablet Take 1-2 tablets (5-10 mg total) by mouth at bedtime as needed for sleep. 60 tablet 0  . aspirin 325 MG tablet Take 325 mg by mouth at bedtime.      No current facility-administered medications for this visit.    Facility-Administered Medications Ordered in Other Visits  Medication Dose Route Frequency Provider Last Rate Last Dose  . CISplatin (PLATINOL) 45 mg in sodium chloride 0.9 % 250 mL chemo infusion  25 mg/m2 (Treatment Plan Recorded) Intravenous Once [name], MD 295 mL/hr at 04/21/19 1322 45 mg at  04/21/19 1322  . gemcitabine (GEMZAR) 1,444 mg in sodium chloride 0.9 % 250 mL chemo infusion  800 mg/m2 (Treatment Plan Recorded) Intravenous Once [name], MD      . heparin lock flush 100 unit/mL  500 Units Intracatheter Once PRN [name], MD      . sodium chloride flush (NS) 0.9 % injection 10 mL  10 mL Intracatheter PRN [name], MD        PHYSICAL EXAMINATION: ECOG PERFORMANCE STATUS: 1 - Symptomatic but completely ambulatory  Vitals:   04/21/19 0833  BP: 123/79  Pulse: 80  Resp: 18  Temp: 98.9 F (37.2 C)  SpO2: 100%   Filed Weights   04/21/19 0833  Weight: 150 lb 4.8 oz (68.2 kg)    GENERAL:alert, no distress and comfortable SKIN: skin color, texture, turgor are normal,  no rashes or significant lesions EYES: normal, Conjunctiva are pink and non-injected, sclera clear  NECK: supple, thyroid normal size, non-tender, without nodularity LYMPH:  no palpable lymphadenopathy in the cervical, axillary  LUNGS: clear to auscultation and percussion with normal breathing effort HEART: regular rate & rhythm and no murmurs and no lower extremity edema ABDOMEN:abdomen soft, non-tender and normal bowel sounds Musculoskeletal:no cyanosis of digits and no clubbing  NEURO: alert & oriented x 3 with fluent speech, no focal motor/sensory deficits  LABORATORY DATA:  I have reviewed the data as listed CBC Latest Ref Rng & Units 04/21/2019 04/08/2019 03/31/2019  WBC 4.0 - 10.5 K/uL 9.3 5.8 5.9  Hemoglobin 13.0 - 17.0 g/dL 10.2(L) 10.9(L) 10.3(L)  Hematocrit 39.0 - 52.0 % 30.9(L) 32.6(L) 31.8(L)  Platelets 150 - 400 K/uL 236 302 370     CMP Latest Ref Rng & Units 04/21/2019 04/08/2019 03/31/2019  Glucose 70 - 99 mg/dL 199(H) 136(H) 252(H)  BUN 8 - 23 mg/dL 19 21 14   Creatinine 0.61 - 1.24 mg/dL 1.39(H) 1.30(H) 1.37(H)  Sodium 135 - 145 mmol/L 139 139 138  Potassium 3.5 - 5.1 mmol/L 4.3 5.2(H) 5.3(H)  Chloride 98 - 111 mmol/L 104 103 103  CO2 22 - 32 mmol/L 24 27 25   Calcium 8.9 - 10.3  mg/dL 9.1 9.3 8.7(L)  Total Protein 6.5 - 8.1 g/dL 6.5 7.1 6.7  Total Bilirubin 0.3 - 1.2 mg/dL 0.3 0.3 0.3  Alkaline Phos 38 - 126 U/L 78 83 89  AST 15 - 41 U/L 20 32 45(H)  ALT 0 - 44 U/L 22 38 53(H)      RADIOGRAPHIC STUDIES: I have personally reviewed the radiological images as listed and agreed with the findings in the report. No results found.   ASSESSMENT & PLAN:  Christopher Welch is a 73 y.o. male with   1. Extrahepatic cholangiocarcinoma -He was recentlydiagnosedin 12/2018. His work up revealed malignant stricture and CBD obstruction required stenting. Brushing revealed adenocarcinoma. His extrahepatic cholangiocarcinoma was not resectable due to vascular invasion(surgeon - Dr. Zenia Resides at [facility]) -He was referred to our clinic to initiate chemotherapy in the neoadjuvant setting. -He proceeded with neoadjuvant gemcitabine and cisplatin on days 1 and 8 every 21 daysstarting 03/12/19. Abraxane added with cycle 2. Added GCSF [name]) to day 9 starting cycle 2.  -He has been tolerating chemo very well so far with no significant issues. He tolerated [name] with mild hip pain, he will continue to use Claritin.  -His tumor marker CA 19.9 has dropped significantly as he started chemo, indicating good response to chemo treatment -He plans to have second opinion at [facility] with medical Oncologist Dr. Jess Barters and anticipate to see surgeon Dr. Carlis Abbott or Dr. Crisoforo Oxford.  -Labs reviewed, CBC and CMP WNL except Hg 10.2, Cr 1.39, , Mag 1.9, Ca 19.9 still pending. Overall adequate to proceed with C3D1 Gemcitabine/Cisplatin/Abraxane today.  -Plan to scan him after cycle 4.  -F/u in 3 weeks    2. Weight loss -secondary to malignancy  -he presented with 20 lbs weight loss  -We reviewed nutrition support withGlucerna -Continue to f/u with dietician -His appetite is adequate and able to gain weight. weight stablelately  3. DM -managed by PCP Dr. Theda Sers -he has had DM for 20 years, he is  compliant with regimen and knows how to adjust BG -on insulin, he knows how to adjust dose based on his BG level, but overall not well controlled, I encourage him to f/u with Dr. Theda Sers  -Due  to elevated BG secondary to chemo pre-meds, he has been needing to increase his insulin but working on controlling it.    4. CAD, HL, HTN -followed by cardiologist Dr. Ellyn Hack -on amlodipine, atenolol, lisinopril, and statin -HTN well controlled  5. Social support -he is single, no children, he lives alone -He has a good friend who can help him and take him to appointments  -I referred him to SW to assess needs - he may benefit from talking with Edwyna Shell about his stress about his diagnosis and treatment  6. Insomnia  -He is able to fall asleep but has trouble staying asleep -He has tried OTC Z-quil which only moderately help.  -If not improving or worsens, I will call in Ambien.   7. Increased Cr  -Cr slightly increased since 03/31/19, possible related to cisplatin or dehydration -He will get hydration with chemo  -I again encourage him to drink more water  -Cr at 1.39 today (04/21/19), overall stable   PLAN: -Labs reviewed and adequate to proceed withC3D1Cisplatin/Gemcitabine/Abraxane today -Lab, flush, f/u with NP Lacie and chemo next week -Lab, flush, f/u with me and chemo in 3 weeks    No problem-specific Assessment & Plan notes found for this encounter.   No orders of the defined types were placed in this encounter.  All questions were answered. The patient knows to call the clinic with any problems, questions or concerns. No barriers to learning was detected. I spent 20 minutes counseling the patient face to face. The total time spent in the appointment was 25 minutes and more than 50% was on counseling and review of test results     [name], MD 04/21/2019   I, Joslyn Devon, am acting as scribe for [name], MD.   I have reviewed the above documentation for  accuracy and completeness, and I agree with the above.

## 2019-04-19 ENCOUNTER — Other Ambulatory Visit: Payer: Self-pay | Admitting: Hematology

## 2019-04-19 DIAGNOSIS — C221 Intrahepatic bile duct carcinoma: Secondary | ICD-10-CM

## 2019-04-21 ENCOUNTER — Inpatient Hospital Stay (HOSPITAL_BASED_OUTPATIENT_CLINIC_OR_DEPARTMENT_OTHER): Payer: Medicare Other | Admitting: Hematology

## 2019-04-21 ENCOUNTER — Other Ambulatory Visit: Payer: Self-pay

## 2019-04-21 ENCOUNTER — Inpatient Hospital Stay: Payer: Medicare Other

## 2019-04-21 ENCOUNTER — Encounter: Payer: Self-pay | Admitting: Hematology

## 2019-04-21 ENCOUNTER — Telehealth: Payer: Self-pay | Admitting: Hematology

## 2019-04-21 VITALS — BP 123/79 | HR 80 | Temp 98.9°F | Resp 18 | Ht 68.0 in | Wt 150.3 lb

## 2019-04-21 DIAGNOSIS — I10 Essential (primary) hypertension: Secondary | ICD-10-CM

## 2019-04-21 DIAGNOSIS — R634 Abnormal weight loss: Secondary | ICD-10-CM | POA: Diagnosis not present

## 2019-04-21 DIAGNOSIS — I700 Atherosclerosis of aorta: Secondary | ICD-10-CM | POA: Diagnosis not present

## 2019-04-21 DIAGNOSIS — C221 Intrahepatic bile duct carcinoma: Secondary | ICD-10-CM

## 2019-04-21 DIAGNOSIS — E785 Hyperlipidemia, unspecified: Secondary | ICD-10-CM

## 2019-04-21 DIAGNOSIS — Z79899 Other long term (current) drug therapy: Secondary | ICD-10-CM

## 2019-04-21 DIAGNOSIS — M25559 Pain in unspecified hip: Secondary | ICD-10-CM

## 2019-04-21 DIAGNOSIS — Z5189 Encounter for other specified aftercare: Secondary | ICD-10-CM | POA: Diagnosis not present

## 2019-04-21 DIAGNOSIS — R7989 Other specified abnormal findings of blood chemistry: Secondary | ICD-10-CM

## 2019-04-21 DIAGNOSIS — C240 Malignant neoplasm of extrahepatic bile duct: Secondary | ICD-10-CM

## 2019-04-21 DIAGNOSIS — Z95828 Presence of other vascular implants and grafts: Secondary | ICD-10-CM

## 2019-04-21 DIAGNOSIS — E119 Type 2 diabetes mellitus without complications: Secondary | ICD-10-CM

## 2019-04-21 DIAGNOSIS — I2510 Atherosclerotic heart disease of native coronary artery without angina pectoris: Secondary | ICD-10-CM | POA: Diagnosis not present

## 2019-04-21 DIAGNOSIS — G4700 Insomnia, unspecified: Secondary | ICD-10-CM

## 2019-04-21 DIAGNOSIS — B181 Chronic viral hepatitis B without delta-agent: Secondary | ICD-10-CM

## 2019-04-21 DIAGNOSIS — Z5112 Encounter for antineoplastic immunotherapy: Secondary | ICD-10-CM | POA: Diagnosis not present

## 2019-04-21 DIAGNOSIS — Z5111 Encounter for antineoplastic chemotherapy: Secondary | ICD-10-CM

## 2019-04-21 DIAGNOSIS — Z794 Long term (current) use of insulin: Secondary | ICD-10-CM

## 2019-04-21 DIAGNOSIS — R590 Localized enlarged lymph nodes: Secondary | ICD-10-CM | POA: Diagnosis not present

## 2019-04-21 DIAGNOSIS — E875 Hyperkalemia: Secondary | ICD-10-CM

## 2019-04-21 DIAGNOSIS — Z881 Allergy status to other antibiotic agents status: Secondary | ICD-10-CM

## 2019-04-21 LAB — CMP (CANCER CENTER ONLY)
ALT: 22 U/L (ref 0–44)
AST: 20 U/L (ref 15–41)
Albumin: 3.2 g/dL — ABNORMAL LOW (ref 3.5–5.0)
Alkaline Phosphatase: 78 U/L (ref 38–126)
Anion gap: 11 (ref 5–15)
BUN: 19 mg/dL (ref 8–23)
CO2: 24 mmol/L (ref 22–32)
Calcium: 9.1 mg/dL (ref 8.9–10.3)
Chloride: 104 mmol/L (ref 98–111)
Creatinine: 1.39 mg/dL — ABNORMAL HIGH (ref 0.61–1.24)
GFR, Est AFR Am: 59 mL/min — ABNORMAL LOW (ref >60)
GFR, Estimated: 51 mL/min — ABNORMAL LOW (ref >60)
Glucose, Bld: 199 mg/dL — ABNORMAL HIGH (ref 70–99)
Potassium: 4.3 mmol/L (ref 3.5–5.1)
Sodium: 139 mmol/L (ref 135–145)
Total Bilirubin: 0.3 mg/dL (ref 0.3–1.2)
Total Protein: 6.5 g/dL (ref 6.5–8.1)

## 2019-04-21 LAB — CBC WITH DIFFERENTIAL (CANCER CENTER ONLY)
Abs Immature Granulocytes: 0.42 10*3/uL — ABNORMAL HIGH (ref 0.00–0.07)
Basophils Absolute: 0.1 10*3/uL (ref 0.0–0.1)
Basophils Relative: 1 %
Eosinophils Absolute: 0 10*3/uL (ref 0.0–0.5)
Eosinophils Relative: 0 %
HCT: 30.9 % — ABNORMAL LOW (ref 39.0–52.0)
Hemoglobin: 10.2 g/dL — ABNORMAL LOW (ref 13.0–17.0)
Immature Granulocytes: 5 %
Lymphocytes Relative: 21 %
Lymphs Abs: 2 10*3/uL (ref 0.7–4.0)
MCH: 30.6 pg (ref 26.0–34.0)
MCHC: 33 g/dL (ref 30.0–36.0)
MCV: 92.8 fL (ref 80.0–100.0)
Monocytes Absolute: 1.6 10*3/uL — ABNORMAL HIGH (ref 0.1–1.0)
Monocytes Relative: 17 %
Neutro Abs: 5.2 10*3/uL (ref 1.7–7.7)
Neutrophils Relative %: 56 %
Platelet Count: 236 10*3/uL (ref 150–400)
RBC: 3.33 MIL/uL — ABNORMAL LOW (ref 4.22–5.81)
RDW: 14.9 % (ref 11.5–15.5)
WBC Count: 9.3 10*3/uL (ref 4.0–10.5)
nRBC: 0.2 % (ref 0.0–0.2)

## 2019-04-21 LAB — MAGNESIUM: Magnesium: 1.9 mg/dL (ref 1.7–2.4)

## 2019-04-21 MED ORDER — SODIUM CHLORIDE 0.9% FLUSH
10.0000 mL | INTRAVENOUS | Status: DC | PRN
  Administered 2019-04-21: 17:00:00 10 mL
  Filled 2019-04-21: qty 10

## 2019-04-21 MED ORDER — HEPARIN SOD (PORK) LOCK FLUSH 100 UNIT/ML IV SOLN
500.0000 [IU] | Freq: Once | INTRAVENOUS | Status: AC | PRN
  Administered 2019-04-21: 17:00:00 500 [IU]
  Filled 2019-04-21: qty 5

## 2019-04-21 MED ORDER — SODIUM CHLORIDE 0.9 % IV SOLN
800.0000 mg/m2 | Freq: Once | INTRAVENOUS | Status: AC
  Administered 2019-04-21: 1444 mg via INTRAVENOUS
  Filled 2019-04-21: qty 37.98

## 2019-04-21 MED ORDER — SODIUM CHLORIDE 0.9 % IV SOLN
25.0000 mg/m2 | Freq: Once | INTRAVENOUS | Status: AC
  Administered 2019-04-21: 13:00:00 45 mg via INTRAVENOUS
  Filled 2019-04-21: qty 45

## 2019-04-21 MED ORDER — SODIUM CHLORIDE 0.9% FLUSH
10.0000 mL | INTRAVENOUS | Status: DC | PRN
  Administered 2019-04-21: 10 mL
  Filled 2019-04-21: qty 10

## 2019-04-21 MED ORDER — SODIUM CHLORIDE 0.9 % IV SOLN
Freq: Once | INTRAVENOUS | Status: AC
  Administered 2019-04-21: 10:00:00 via INTRAVENOUS
  Filled 2019-04-21: qty 1000

## 2019-04-21 MED ORDER — PALONOSETRON HCL INJECTION 0.25 MG/5ML
INTRAVENOUS | Status: AC
  Filled 2019-04-21: qty 5

## 2019-04-21 MED ORDER — SODIUM CHLORIDE 0.9 % IV SOLN
Freq: Once | INTRAVENOUS | Status: AC
  Administered 2019-04-21: 12:00:00 via INTRAVENOUS
  Filled 2019-04-21: qty 5

## 2019-04-21 MED ORDER — PACLITAXEL PROTEIN-BOUND CHEMO INJECTION 100 MG
100.0000 mg/m2 | Freq: Once | INTRAVENOUS | Status: AC
  Administered 2019-04-21: 175 mg via INTRAVENOUS
  Filled 2019-04-21: qty 35

## 2019-04-21 MED ORDER — SODIUM CHLORIDE 0.9 % IV SOLN
Freq: Once | INTRAVENOUS | Status: AC
  Administered 2019-04-21: 09:00:00 via INTRAVENOUS
  Filled 2019-04-21: qty 250

## 2019-04-21 MED ORDER — PALONOSETRON HCL INJECTION 0.25 MG/5ML
0.2500 mg | Freq: Once | INTRAVENOUS | Status: AC
  Administered 2019-04-21: 0.25 mg via INTRAVENOUS

## 2019-04-21 NOTE — Patient Instructions (Signed)
[facility] Discharge Instructions for Patients Receiving Chemotherapy  Today you received the following chemotherapy agents Abraxane/Cisplatin/Gemzar.  To help prevent nausea and vomiting after your treatment, we encourage you to take your nausea medication as directed.   If you develop nausea and vomiting that is not controlled by your nausea medication, call the clinic.   BELOW ARE SYMPTOMS THAT SHOULD BE REPORTED IMMEDIATELY:  *FEVER GREATER THAN 100.5 F  *CHILLS WITH OR WITHOUT FEVER  NAUSEA AND VOMITING THAT IS NOT CONTROLLED WITH YOUR NAUSEA MEDICATION  *UNUSUAL SHORTNESS OF BREATH  *UNUSUAL BRUISING OR BLEEDING  TENDERNESS IN MOUTH AND THROAT WITH OR WITHOUT PRESENCE OF ULCERS  *URINARY PROBLEMS  *BOWEL PROBLEMS  UNUSUAL RASH Items with * indicate a potential emergency and should be followed up as soon as possible.  Feel free to call the clinic should you have any questions or concerns. The clinic phone number is [phone].  Please show the [place] at check-in to the Emergency Department and triage nurse.

## 2019-04-21 NOTE — Telephone Encounter (Signed)
Scheduled appt per 7/22 los. °

## 2019-04-21 NOTE — Progress Notes (Signed)
No dextrose per MD.  Have adjusted IV hydration for Cisplatin today and subsequent cycles. MD ok w/ Gemzar in NS 250 mL if we need to change that (from 100 mL). Kennith Center, Pharm.D., CPP 04/21/2019@8 :58 AM

## 2019-04-21 NOTE — Patient Instructions (Signed)

## 2019-04-22 LAB — CANCER ANTIGEN 19-9: CA 19-9: 114 U/mL — ABNORMAL HIGH (ref 0–35)

## 2019-04-27 NOTE — Progress Notes (Signed)
[place]   [phone] [phone]   Clinic Follow up Note   Patient Care Team: Christopher Morning, DO as PCP - General (Family Medicine) 04/28/2019  CHIEF COMPLAINT: Follow-up extrahepatic cholangiocarcinoma  SUMMARY OF ONCOLOGIC HISTORY: Oncology History  Cholangiocarcinoma ([place])  01/14/2019 Imaging   US Abdomen 01/14/19  IMPRESSION: There is significant intrahepatic and extrahepatic biliary dilatation present, concerning for distal common bile duct obstruction. Further evaluation with CT scan or MRCP is recommended. Correlation with liver function tests is recommended as well. These results will be called to the ordering clinician or representative by the Radiologist Assistant, and communication documented in the PACS or zVision Dashboard.   Probable large amount of sludge seen within gallbladder lumen with mild gallbladder wall thickening. However, no cholelithiasis, pericholecystic fluid or sonographic Murphy's sign is noted.   4.2 cm septated cyst seen in upper pole of right kidney consistent with Bosniak type 2 lesion. Follow-up ultrasound in 1 year is recommended to ensure stability.   01/20/2019 Imaging   MRI abdomen 01/20/19  IMPRESSION: 1. Findings are highly concerning for central tumor in the biliary tract at the confluence of the common hepatic duct, cystic duct and common bile ducts. Further clinical evaluation for potential cholangiocarcinoma is strongly recommended. 2. Mild ductal dilatation of the main pancreatic duct throughout the distal body and tail of the pancreas where there is also some associated side branch ectasia. This may suggest a pancreatic ductal stricture. No obstructing pancreatic neoplasm identified. 3. Aortic atherosclerosis.   01/21/2019 Initial Biopsy   Diagnosis 01/21/19  BILE DUCT BRUSHING (SPECIMEN 1 OF 1 COLLECTED 01/21/2019) ADENOCARCINOMA.   01/21/2019 Procedure   ERCP By Christopher Welch 01/21/19  IMRPESSION - The major papilla appeared normal. - A single localized biliary stricture was found in the middle third of the main bile duct. - The entire main bile duct and upper third of the main bile duct were dilated, secondary to a stricture. - A biliary sphincterotomy was performed. - Cells for cytology obtained in the middle third of the main bile duct. - One plastic stent was placed into the common bile duct.  EUS by Christopher Welch 01/21/19  IMPRESSION - There was dilation in the middle third of the main bile duct and in the upper third of the main bile duct which measured up to 15 mm. - There was a suggestion of a stricture in the middle third of the main bile duct. - No specimens collected.   02/03/2019 Imaging   CT CAP at [facility] 02/03/19  Impression:  1. There is mild intrahepatic and extrahepatic biliary ductal dilation and diffuse common bile duct wall thickening with stent in place. There is abnormal soft tissue measuring approximately 1.6 cm between the common hepatic artery, portal vein and common bile duct, worrisome for the known cholangiocarcinoma.  2. Periportal lymphadenopathy which may represent nodal metastasis.  4. Marked pancreatic atrophy and duct dilation involving the distal body and tail the pancreas where there is a coarse calcification. These findings are favored to represent stenosis from intraductal stones though an underlying stricture cannot be completely excluded.  5. Atypical symmetric prominent fat stranding of the bilateral lower abdominal wall. Correlate with surgical history or history of history of trauma.   03/08/2019 Initial Diagnosis   Cholangiocarcinoma ([place])   03/12/2019 -  Chemotherapy   gemcitabine and cisplatin on days 1 and 8 every 21 days starting 03/12/19. Abraxane added with cycle 2. Added GCSF Christopher Welch) to day 9 starting cycle 2.  03/19/2019 Cancer Staging   Staging form: Perihilar Bile Ducts, AJCC 8th Edition - Clinical: Stage  Unknown (cTX, cN0, cM0) - Signed by Christopher Merle, MD on 03/19/2019     CURRENT THERAPY: gemcitabine and cisplatin on days 1 and 8 every 21 daysstarting 03/12/19. Abraxane added with cycle 2. Added GCSF Christopher Welch) to day 9 starting cycle 2.  INTERVAL HISTORY: [name] returns for follow-up and treatment day 9 as scheduled.  He completed cycle 3-day 1 on 04/21/2019. He is doing well today. He has fairly constant fatigue that fluctuates. He is able to continue all tasks, ADLs, and work at [employer] some. He is eating and drinking well. Denies mucositis. Denies nausea this time, only had one episode last cycle. Denies constipation or diarrhea. Has intermittent mid abdominal discomfort, controlled with oxycodone he takes at night PRN. Denies fever, chills, dysuria, cough, chest pain, dyspnea, leg edema, neuropathy. He is able to play his piano. He notes blood sugar increases to 200-300 for 5 days after chemo, he adjusts insulin accordingly. BG 141 during this appointment on his meter. He will see Christopher Welch tomorrow at [facility], he hopes to see Christopher. Carlis Welch eventually.  MEDICAL HISTORY:  Past Medical History:  Diagnosis Date  . Diabetes mellitus type I, controlled ([place])   . Dyslipidemia   . ED (erectile dysfunction)   . Essential hypertension   . Gilbert's disease   . Hepatitis B carrier [facility])     SURGICAL HISTORY: Past Surgical History:  Procedure Laterality Date  . APPENDECTOMY  1964  . BILIARY BRUSHING  01/21/2019   Procedure: BILIARY BRUSHING;  Surgeon: Christopher Ada, MD;  Location: [facility];  Service: Endoscopy;;  . BILIARY STENT PLACEMENT N/A 01/21/2019   Procedure: BILIARY STENT PLACEMENT;  Surgeon: Christopher Ada, MD;  Location: [facility];  Service: Endoscopy;  Laterality: N/A;  . ENDOSCOPIC RETROGRADE CHOLANGIOPANCREATOGRAPHY (ERCP) WITH PROPOFOL N/A 01/21/2019   Procedure: ENDOSCOPIC RETROGRADE CHOLANGIOPANCREATOGRAPHY (ERCP) WITH PROPOFOL;  Surgeon: Christopher Ada, MD;   Location: [facility];  Service: Endoscopy;  Laterality: N/A;  . ESOPHAGOGASTRODUODENOSCOPY (EGD) WITH PROPOFOL N/A 01/21/2019   Procedure: ESOPHAGOGASTRODUODENOSCOPY (EGD) WITH PROPOFOL;  Surgeon: Christopher Ada, MD;  Location: [facility];  Service: Endoscopy;  Laterality: N/A;  . FEMORAL HERNIA REPAIR    . IR IMAGING GUIDED PORT INSERTION  03/08/2019  . LEFT HEART CATH AND CORONARY ANGIOGRAPHY N/A 05/19/2017   Procedure: LEFT HEART CATH AND CORONARY ANGIOGRAPHY;  Surgeon: Leonie Man, MD;  Location: [facility];  Service: Cardiovascular;  Laterality: N/A;  . SPHINCTEROTOMY  01/21/2019   Procedure: SPHINCTEROTOMY;  Surgeon: Christopher Ada, MD;  Location: [facility];  Service: Endoscopy;;  . UPPER ESOPHAGEAL ENDOSCOPIC ULTRASOUND (EUS) N/A 01/21/2019   Procedure: UPPER ESOPHAGEAL ENDOSCOPIC ULTRASOUND (EUS);  Surgeon: Christopher Ada, MD;  Location: [facility];  Service: Endoscopy;  Laterality: N/A;    I have reviewed the social history and family history with the patient and they are unchanged from previous note.  ALLERGIES:  is allergic to erythromycin.  MEDICATIONS:  Current Outpatient Medications  Medication Sig Dispense Refill  . amLODipine (NORVASC) 5 MG tablet Take 5 mg by mouth daily.     Marland Kitchen atenolol (TENORMIN) 25 MG tablet Take by mouth daily. Once a day    . atorvastatin (LIPITOR) 10 MG tablet Take 10 mg by mouth daily.    . Blood Glucose Monitoring Suppl (ACCU-CHEK NANO SMARTVIEW) W/DEVICE KIT 1 kit by Does not apply route 2 (two) times daily. (Patient taking differently: 1 kit  by Does not apply route See admin instructions. Test blood sugars 12x's daily) 1 kit 0  . DiphenhydrAMINE HCl (ZZZQUIL) 50 MG/30ML LIQD Take 60 mLs by mouth at bedtime.    Marland Kitchen glucose blood test strip Test 3 times a day. (Patient taking differently: 1 each by Other route See admin instructions. Test 12 times a day.) 300 each Prn  . insulin NPH Human (NOVOLIN N) 100 UNIT/ML injection Inject 10 Units  into the skin 2 (two) times a day.    . insulin regular (NOVOLIN R) 100 units/mL injection Inject 10 Units into the skin 3 (three) times daily before meals.    . lidocaine-prilocaine (EMLA) cream Apply to affected area once 30 g 3  . lisinopril (ZESTRIL) 20 MG tablet Take 20 mg by mouth daily.     . magnesium oxide (MAG-OX) 400 (241.3 Mg) MG tablet Take 1 tablet (400 mg total) by mouth daily. 30 tablet 2  . metFORMIN (GLUCOPHAGE) 1000 MG tablet Take 2,000 mg by mouth every evening.     . Niacin CR (SLO-NIACIN) 750 MG TBCR Take 1,500 mg by mouth at bedtime.    Marland Kitchen omeprazole (PRILOSEC OTC) 20 MG tablet Take 20 mg by mouth daily.    . ondansetron (ZOFRAN) 8 MG tablet TAKE 1 TABLET BY MOUTH 2 TIMES DAY AS NEEDED. START ON 3RD DAY AFTER CHEMOTHERAPY 30 tablet 1  . oxycodone (OXY-IR) 5 MG capsule Take 1 capsule (5 mg total) by mouth every 6 (six) hours as needed for pain. 30 capsule 0  . prochlorperazine (COMPAZINE) 10 MG tablet TAKE 1 TABLET (10 MG TOTAL) BY MOUTH EVERY 6 (SIX) HOURS AS NEEDED (NAUSEA OR VOMITING). 30 tablet 1  . zolpidem (AMBIEN) 5 MG tablet Take 1-2 tablets (5-10 mg total) by mouth at bedtime as needed for sleep. 60 tablet 0  . aspirin 325 MG tablet Take 325 mg by mouth at bedtime.      No current facility-administered medications for this visit.    Facility-Administered Medications Ordered in Other Visits  Medication Dose Route Frequency Provider Last Rate Last Dose  . CISplatin (PLATINOL) 45 mg in sodium chloride 0.9 % 250 mL chemo infusion  25 mg/m2 (Treatment Plan Recorded) Intravenous Once Christopher Merle, MD      . gemcitabine (GEMZAR) 1,444 mg in sodium chloride 0.9 % 250 mL chemo infusion  800 mg/m2 (Treatment Plan Recorded) Intravenous Once Christopher Merle, MD      . heparin lock flush 100 unit/mL  500 Units Intracatheter Once PRN Christopher Merle, MD      . PACLitaxel-protein bound (ABRAXANE) chemo infusion 175 mg  100 mg/m2 (Treatment Plan Recorded) Intravenous Once Christopher Merle, MD      .  sodium chloride flush (NS) 0.9 % injection 10 mL  10 mL Intracatheter PRN Christopher Merle, MD        PHYSICAL EXAMINATION: ECOG PERFORMANCE STATUS: 1 - Symptomatic but completely ambulatory  Vitals:   04/28/19 0835  BP: 122/65  Pulse: 96  Resp: 18  Temp: 97.8 F (36.6 C)  SpO2: 100%   Filed Weights   04/28/19 0835  Weight: 149 lb 1.6 oz (67.6 kg)    GENERAL:alert, no distress and comfortable SKIN: no rash  EYES: sclera clear LUNGS: clear to auscultation with normal breathing effort HEART: regular rate & rhythm, no lower extremity edema ABDOMEN:abdomen soft, non-tender and normal bowel sounds Musculoskeletal:no cyanosis of digits NEURO: alert & oriented x 3 with fluent speech, normal gait PAC incision with mild erythema, no  swelling or drainage   LABORATORY DATA:  I have reviewed the data as listed CBC Latest Ref Rng & Units 04/28/2019 04/21/2019 04/08/2019  WBC 4.0 - 10.5 K/uL 6.1 9.3 5.8  Hemoglobin 13.0 - 17.0 g/dL 9.7(L) 10.2(L) 10.9(L)  Hematocrit 39.0 - 52.0 % 29.1(L) 30.9(L) 32.6(L)  Platelets 150 - 400 K/uL 242 236 302     CMP Latest Ref Rng & Units 04/28/2019 04/21/2019 04/08/2019  Glucose 70 - 99 mg/dL 171(H) 199(H) 136(H)  BUN 8 - 23 mg/dL 25(H) 19 21  Creatinine 0.61 - 1.24 mg/dL 1.21 1.39(H) 1.30(H)  Sodium 135 - 145 mmol/L 138 139 139  Potassium 3.5 - 5.1 mmol/L 4.4 4.3 5.2(H)  Chloride 98 - 111 mmol/L 105 104 103  CO2 22 - 32 mmol/L _0 Calcium 8.9 - 10.3 mg/dL 9.3 9.1 9.3  Total Protein 6.5 - 8.1 g/dL 6.7 6.5 7.1  Total Bilirubin 0.3 - 1.2 mg/dL 0.4 0.3 0.3  Alkaline Phos 38 - 126 U/L 62 78 83  AST 15 - 41 U/L 17 20 32  ALT 0 - 44 U/L 19 22 38      RADIOGRAPHIC STUDIES: I have personally reviewed the radiological images as listed and agreed with the findings in the report. No results found.   ASSESSMENT & PLAN: Christopher Welch a 73 y.o.malewith   1. Extrahepatic cholangiocarcinoma -He was recentlydiagnosedin 12/2018. His work up revealed  malignant stricture and CBD obstruction required stenting. Brushing revealed adenocarcinoma. His extrahepatic cholangiocarcinoma was not resectable due to vascular invasion(surgeon - [facility]. Zenia Resides at [facility]) -He was referred to our clinic to initiate chemotherapy in the neoadjuvant setting.  - Christopher. Burr Medico recommendedgemcitabine and cisplatin for cycle 1, on days 1 and 8 every 21 days. He tolerated well overall with fatigue and mild nausea. Abraxanewas added with cycle 2 based on phase 2 data.He started chemo on 03/12/19 -initially he planned to f/u at the [facility] after 3 months of chemo but in the interval has requested second opinion with Christopher. Barry Dienes and with [facility]. He will see [facility] on 7/30 -s/p 3 cycles of chemo in the neoadjuvant setting, he tolerates very well overall -Abdominal pain is intermittent, stable, and well managed on current regimen. He takes tylenol with oxycodone once per day usually at night.  2. Weight loss -secondary to malignancy; he presented with 20 lbs weight loss  -We previously reviewed nutrition support withGlucerna -Continue to f/u with dietician -weight fluctuates by a few pounds but overall stable  3. DM -managed by PCP Christopher. Theda Sers -he has had DM for 20 years, he is compliant with regimen and knows how to adjust BG -on insulin - f/u with Christopher. Theda Sers -BG increased to 200-300 for 5 days after chemo, likely related to steroids  4. CAD, HL, HTN -followed by cardiologist Christopher. Ellyn Hack -on amlodipine, atenolol, lisinopril, and statin -BP well controlled lately   5. Social support -he is single, no children, he lives alone -He has a good friend who can help him and take him to appointments  -he is tolerating chemo very well and is able to work some  6. Trouble sleeping  -Seroquel is not effective, Christopher. Burr Medico previously called in Ambien - he takes half tablet is very effective  7. Increased Cr, hyperkalemia  -Cr slightly increased after first chemo, possibly related  to cisplatin or dehydration -K has been removed from fluids intermittently -normal today  Dispo: Mr. Kresse appears well. He completed cycle 3 day 1 gemcitabine, cisplatin, and  abraxane (added with cycle 2) and [name]. He tolerates treatment very well overall, with mild fatigue. Labs reviewed, CBC and CMP adequate for treatment. CA 19-9 is stable. He has increased hyperglycemia after chemo, to 200-300 x5 days. Will reduce premed dex to 10 mg. He will proceed with cycle 3 day 8 chemo today. He will receive udenyca on 7/31. Has consult at [facility] on 7/30. He will return for f/u and cycle 4 in 2 weeks.   All questions were answered. The patient knows to call the clinic with any problems, questions or concerns. No barriers to learning was detected.     Alla Feeling, NP 04/28/19

## 2019-04-28 ENCOUNTER — Inpatient Hospital Stay: Payer: Medicare Other

## 2019-04-28 ENCOUNTER — Other Ambulatory Visit: Payer: Self-pay

## 2019-04-28 ENCOUNTER — Encounter: Payer: Self-pay | Admitting: Nurse Practitioner

## 2019-04-28 ENCOUNTER — Inpatient Hospital Stay (HOSPITAL_BASED_OUTPATIENT_CLINIC_OR_DEPARTMENT_OTHER): Payer: Medicare Other | Admitting: Nurse Practitioner

## 2019-04-28 ENCOUNTER — Telehealth: Payer: Self-pay | Admitting: Nutrition

## 2019-04-28 ENCOUNTER — Telehealth: Payer: Self-pay | Admitting: Nurse Practitioner

## 2019-04-28 VITALS — BP 122/65 | HR 96 | Temp 97.8°F | Resp 18 | Ht 68.0 in | Wt 149.1 lb

## 2019-04-28 DIAGNOSIS — I2510 Atherosclerotic heart disease of native coronary artery without angina pectoris: Secondary | ICD-10-CM

## 2019-04-28 DIAGNOSIS — C240 Malignant neoplasm of extrahepatic bile duct: Secondary | ICD-10-CM

## 2019-04-28 DIAGNOSIS — Z794 Long term (current) use of insulin: Secondary | ICD-10-CM

## 2019-04-28 DIAGNOSIS — I10 Essential (primary) hypertension: Secondary | ICD-10-CM | POA: Diagnosis not present

## 2019-04-28 DIAGNOSIS — R634 Abnormal weight loss: Secondary | ICD-10-CM | POA: Diagnosis not present

## 2019-04-28 DIAGNOSIS — Z881 Allergy status to other antibiotic agents status: Secondary | ICD-10-CM

## 2019-04-28 DIAGNOSIS — G4700 Insomnia, unspecified: Secondary | ICD-10-CM | POA: Diagnosis not present

## 2019-04-28 DIAGNOSIS — C221 Intrahepatic bile duct carcinoma: Secondary | ICD-10-CM

## 2019-04-28 DIAGNOSIS — Z5111 Encounter for antineoplastic chemotherapy: Secondary | ICD-10-CM | POA: Diagnosis not present

## 2019-04-28 DIAGNOSIS — Z5112 Encounter for antineoplastic immunotherapy: Secondary | ICD-10-CM

## 2019-04-28 DIAGNOSIS — B181 Chronic viral hepatitis B without delta-agent: Secondary | ICD-10-CM

## 2019-04-28 DIAGNOSIS — R7989 Other specified abnormal findings of blood chemistry: Secondary | ICD-10-CM | POA: Diagnosis not present

## 2019-04-28 DIAGNOSIS — I700 Atherosclerosis of aorta: Secondary | ICD-10-CM

## 2019-04-28 DIAGNOSIS — M25559 Pain in unspecified hip: Secondary | ICD-10-CM

## 2019-04-28 DIAGNOSIS — E785 Hyperlipidemia, unspecified: Secondary | ICD-10-CM

## 2019-04-28 DIAGNOSIS — E119 Type 2 diabetes mellitus without complications: Secondary | ICD-10-CM

## 2019-04-28 DIAGNOSIS — E875 Hyperkalemia: Secondary | ICD-10-CM

## 2019-04-28 DIAGNOSIS — R590 Localized enlarged lymph nodes: Secondary | ICD-10-CM

## 2019-04-28 DIAGNOSIS — Z79899 Other long term (current) drug therapy: Secondary | ICD-10-CM

## 2019-04-28 DIAGNOSIS — Z5189 Encounter for other specified aftercare: Secondary | ICD-10-CM | POA: Diagnosis not present

## 2019-04-28 DIAGNOSIS — Z95828 Presence of other vascular implants and grafts: Secondary | ICD-10-CM

## 2019-04-28 LAB — CBC WITH DIFFERENTIAL (CANCER CENTER ONLY)
Abs Immature Granulocytes: 0.04 10*3/uL (ref 0.00–0.07)
Basophils Absolute: 0 10*3/uL (ref 0.0–0.1)
Basophils Relative: 1 %
Eosinophils Absolute: 0 10*3/uL (ref 0.0–0.5)
Eosinophils Relative: 0 %
HCT: 29.1 % — ABNORMAL LOW (ref 39.0–52.0)
Hemoglobin: 9.7 g/dL — ABNORMAL LOW (ref 13.0–17.0)
Immature Granulocytes: 1 %
Lymphocytes Relative: 29 %
Lymphs Abs: 1.7 10*3/uL (ref 0.7–4.0)
MCH: 30.4 pg (ref 26.0–34.0)
MCHC: 33.3 g/dL (ref 30.0–36.0)
MCV: 91.2 fL (ref 80.0–100.0)
Monocytes Absolute: 0.6 10*3/uL (ref 0.1–1.0)
Monocytes Relative: 9 %
Neutro Abs: 3.7 10*3/uL (ref 1.7–7.7)
Neutrophils Relative %: 60 %
Platelet Count: 242 10*3/uL (ref 150–400)
RBC: 3.19 MIL/uL — ABNORMAL LOW (ref 4.22–5.81)
RDW: 14.4 % (ref 11.5–15.5)
WBC Count: 6.1 10*3/uL (ref 4.0–10.5)
nRBC: 0 % (ref 0.0–0.2)

## 2019-04-28 LAB — CMP (CANCER CENTER ONLY)
ALT: 19 U/L (ref 0–44)
AST: 17 U/L (ref 15–41)
Albumin: 3.4 g/dL — ABNORMAL LOW (ref 3.5–5.0)
Alkaline Phosphatase: 62 U/L (ref 38–126)
Anion gap: 10 (ref 5–15)
BUN: 25 mg/dL — ABNORMAL HIGH (ref 8–23)
CO2: 23 mmol/L (ref 22–32)
Calcium: 9.3 mg/dL (ref 8.9–10.3)
Chloride: 105 mmol/L (ref 98–111)
Creatinine: 1.21 mg/dL (ref 0.61–1.24)
GFR, Est AFR Am: >60 mL/min (ref >60)
GFR, Estimated: 60 mL/min — ABNORMAL LOW (ref >60)
Glucose, Bld: 171 mg/dL — ABNORMAL HIGH (ref 70–99)
Potassium: 4.4 mmol/L (ref 3.5–5.1)
Sodium: 138 mmol/L (ref 135–145)
Total Bilirubin: 0.4 mg/dL (ref 0.3–1.2)
Total Protein: 6.7 g/dL (ref 6.5–8.1)

## 2019-04-28 LAB — MAGNESIUM: Magnesium: 1.6 mg/dL — ABNORMAL LOW (ref 1.7–2.4)

## 2019-04-28 MED ORDER — SODIUM CHLORIDE 0.9 % IV SOLN
25.0000 mg/m2 | Freq: Once | INTRAVENOUS | Status: AC
  Administered 2019-04-28: 45 mg via INTRAVENOUS
  Filled 2019-04-28: qty 45

## 2019-04-28 MED ORDER — SODIUM CHLORIDE 0.9% FLUSH
10.0000 mL | INTRAVENOUS | Status: DC | PRN
  Administered 2019-04-28: 10 mL
  Filled 2019-04-28: qty 10

## 2019-04-28 MED ORDER — PACLITAXEL PROTEIN-BOUND CHEMO INJECTION 100 MG
100.0000 mg/m2 | Freq: Once | INTRAVENOUS | Status: AC
  Administered 2019-04-28: 175 mg via INTRAVENOUS
  Filled 2019-04-28: qty 35

## 2019-04-28 MED ORDER — PALONOSETRON HCL INJECTION 0.25 MG/5ML
0.2500 mg | Freq: Once | INTRAVENOUS | Status: AC
  Administered 2019-04-28: 0.25 mg via INTRAVENOUS

## 2019-04-28 MED ORDER — SODIUM CHLORIDE 0.9 % IV SOLN
Freq: Once | INTRAVENOUS | Status: AC
  Administered 2019-04-28: 13:00:00 via INTRAVENOUS
  Filled 2019-04-28: qty 5

## 2019-04-28 MED ORDER — HEPARIN SOD (PORK) LOCK FLUSH 100 UNIT/ML IV SOLN
500.0000 [IU] | Freq: Once | INTRAVENOUS | Status: AC | PRN
  Administered 2019-04-28: 500 [IU]
  Filled 2019-04-28: qty 5

## 2019-04-28 MED ORDER — SODIUM CHLORIDE 0.9 % IV SOLN
800.0000 mg/m2 | Freq: Once | INTRAVENOUS | Status: AC
  Administered 2019-04-28: 1444 mg via INTRAVENOUS
  Filled 2019-04-28: qty 37.98

## 2019-04-28 MED ORDER — SODIUM CHLORIDE 0.9 % IV SOLN
Freq: Once | INTRAVENOUS | Status: AC
  Administered 2019-04-28: 10:00:00 via INTRAVENOUS
  Filled 2019-04-28: qty 250

## 2019-04-28 MED ORDER — PALONOSETRON HCL INJECTION 0.25 MG/5ML
INTRAVENOUS | Status: AC
  Filled 2019-04-28: qty 5

## 2019-04-28 MED ORDER — SODIUM CHLORIDE 0.9 % IV SOLN
Freq: Once | INTRAVENOUS | Status: AC
  Administered 2019-04-28: 10:00:00 via INTRAVENOUS
  Filled 2019-04-28: qty 1000

## 2019-04-28 NOTE — Patient Instructions (Signed)
[facility] Discharge Instructions for Patients Receiving Chemotherapy  Today you received the following chemotherapy agents: Abraxane, Cisplatin, and Gemzar.  To help prevent nausea and vomiting after your treatment, we encourage you to take your nausea medication as directed.   If you develop nausea and vomiting that is not controlled by your nausea medication, call the clinic.   BELOW ARE SYMPTOMS THAT SHOULD BE REPORTED IMMEDIATELY:  *FEVER GREATER THAN 100.5 F  *CHILLS WITH OR WITHOUT FEVER  NAUSEA AND VOMITING THAT IS NOT CONTROLLED WITH YOUR NAUSEA MEDICATION  *UNUSUAL SHORTNESS OF BREATH  *UNUSUAL BRUISING OR BLEEDING  TENDERNESS IN MOUTH AND THROAT WITH OR WITHOUT PRESENCE OF ULCERS  *URINARY PROBLEMS  *BOWEL PROBLEMS  UNUSUAL RASH Items with * indicate a potential emergency and should be followed up as soon as possible.  Feel free to call the clinic should you have any questions or concerns. The clinic phone number is [phone].  Please show the [place] at check-in to the Emergency Department and triage nurse.

## 2019-04-28 NOTE — Telephone Encounter (Signed)
Left message for patient for a telephone visit for pre reg

## 2019-04-28 NOTE — Telephone Encounter (Signed)
No los per 7/29. °

## 2019-04-29 ENCOUNTER — Inpatient Hospital Stay: Payer: Medicare Other | Admitting: Nutrition

## 2019-04-29 ENCOUNTER — Ambulatory Visit: Payer: Medicare Other

## 2019-04-29 DIAGNOSIS — C221 Intrahepatic bile duct carcinoma: Secondary | ICD-10-CM | POA: Diagnosis not present

## 2019-04-29 DIAGNOSIS — Z79899 Other long term (current) drug therapy: Secondary | ICD-10-CM | POA: Diagnosis not present

## 2019-04-29 NOTE — Progress Notes (Signed)
RD working remotely.  Nutrition follow up completed with patient who is receiving chemotherapy for extrahepatic cholangiocarcinoma. Weight is relatively stable at 149.1 pounds. Appetite remains good and he is eating well. He is drinking Boost and CIB every day. Denies Nausea and Vomiting.  Nutrition Diagnosis: Inadequate oral intake resolved.  Intervention: Continue adequate calories and protein to maintain weight.  Monitoring, Evaluation, Goals: Weight maintenance.  Next Visit: Patient will contact me with questions.

## 2019-04-30 ENCOUNTER — Inpatient Hospital Stay: Payer: Medicare Other

## 2019-04-30 ENCOUNTER — Ambulatory Visit: Payer: Medicare Other

## 2019-04-30 ENCOUNTER — Other Ambulatory Visit: Payer: Self-pay

## 2019-04-30 VITALS — BP 131/70 | HR 85 | Temp 98.2°F | Resp 18

## 2019-04-30 DIAGNOSIS — K810 Acute cholecystitis: Secondary | ICD-10-CM | POA: Diagnosis not present

## 2019-04-30 DIAGNOSIS — C221 Intrahepatic bile duct carcinoma: Secondary | ICD-10-CM

## 2019-04-30 DIAGNOSIS — B181 Chronic viral hepatitis B without delta-agent: Secondary | ICD-10-CM | POA: Diagnosis not present

## 2019-04-30 DIAGNOSIS — Z20828 Contact with and (suspected) exposure to other viral communicable diseases: Secondary | ICD-10-CM | POA: Diagnosis not present

## 2019-04-30 DIAGNOSIS — K819 Cholecystitis, unspecified: Secondary | ICD-10-CM | POA: Diagnosis not present

## 2019-04-30 DIAGNOSIS — Z03818 Encounter for observation for suspected exposure to other biological agents ruled out: Secondary | ICD-10-CM | POA: Diagnosis not present

## 2019-04-30 DIAGNOSIS — D61818 Other pancytopenia: Secondary | ICD-10-CM | POA: Diagnosis not present

## 2019-04-30 DIAGNOSIS — B961 Klebsiella pneumoniae [K. pneumoniae] as the cause of diseases classified elsewhere: Secondary | ICD-10-CM | POA: Diagnosis not present

## 2019-04-30 DIAGNOSIS — E119 Type 2 diabetes mellitus without complications: Secondary | ICD-10-CM | POA: Diagnosis not present

## 2019-04-30 MED ORDER — PEGFILGRASTIM-CBQV 6 MG/0.6ML ~~LOC~~ SOSY
6.0000 mg | PREFILLED_SYRINGE | Freq: Once | SUBCUTANEOUS | Status: AC
  Administered 2019-04-30: 08:00:00 6 mg via SUBCUTANEOUS

## 2019-04-30 MED ORDER — PEGFILGRASTIM-CBQV 6 MG/0.6ML ~~LOC~~ SOSY
PREFILLED_SYRINGE | SUBCUTANEOUS | Status: AC
  Filled 2019-04-30: qty 0.6

## 2019-04-30 NOTE — Patient Instructions (Signed)

## 2019-05-03 ENCOUNTER — Telehealth: Payer: Self-pay

## 2019-05-03 ENCOUNTER — Other Ambulatory Visit: Payer: Self-pay

## 2019-05-03 ENCOUNTER — Inpatient Hospital Stay: Payer: Medicare Other

## 2019-05-03 ENCOUNTER — Encounter (HOSPITAL_COMMUNITY): Payer: Self-pay

## 2019-05-03 ENCOUNTER — Inpatient Hospital Stay (HOSPITAL_COMMUNITY)
Admission: EM | Admit: 2019-05-03 | Discharge: 2019-05-07 | DRG: 445 | Disposition: A | Discharge status: Home / Self Care | Payer: Medicare Other | Attending: Student | Admitting: Student

## 2019-05-03 ENCOUNTER — Ambulatory Visit (HOSPITAL_COMMUNITY)
Admission: RE | Admit: 2019-05-03 | Discharge: 2019-05-03 | Disposition: A | Discharge status: Home / Self Care | Payer: Medicare Other | Source: Ambulatory Visit | Attending: Medical | Admitting: Medical

## 2019-05-03 ENCOUNTER — Inpatient Hospital Stay: Payer: Medicare Other | Attending: Hematology | Admitting: Medical

## 2019-05-03 VITALS — BP 118/66 | HR 90 | Temp 98.0°F | Resp 18 | Ht 68.0 in | Wt 146.7 lb

## 2019-05-03 DIAGNOSIS — C221 Intrahepatic bile duct carcinoma: Secondary | ICD-10-CM

## 2019-05-03 DIAGNOSIS — D61818 Other pancytopenia: Secondary | ICD-10-CM | POA: Diagnosis not present

## 2019-05-03 DIAGNOSIS — Z5111 Encounter for antineoplastic chemotherapy: Secondary | ICD-10-CM | POA: Insufficient documentation

## 2019-05-03 DIAGNOSIS — D649 Anemia, unspecified: Secondary | ICD-10-CM | POA: Diagnosis not present

## 2019-05-03 DIAGNOSIS — K819 Cholecystitis, unspecified: Secondary | ICD-10-CM | POA: Diagnosis not present

## 2019-05-03 DIAGNOSIS — E785 Hyperlipidemia, unspecified: Secondary | ICD-10-CM | POA: Diagnosis present

## 2019-05-03 DIAGNOSIS — Z881 Allergy status to other antibiotic agents status: Secondary | ICD-10-CM | POA: Diagnosis not present

## 2019-05-03 DIAGNOSIS — Z20828 Contact with and (suspected) exposure to other viral communicable diseases: Secondary | ICD-10-CM | POA: Diagnosis present

## 2019-05-03 DIAGNOSIS — I2510 Atherosclerotic heart disease of native coronary artery without angina pectoris: Secondary | ICD-10-CM | POA: Diagnosis present

## 2019-05-03 DIAGNOSIS — R1011 Right upper quadrant pain: Secondary | ICD-10-CM | POA: Diagnosis not present

## 2019-05-03 DIAGNOSIS — D638 Anemia in other chronic diseases classified elsewhere: Secondary | ICD-10-CM | POA: Diagnosis present

## 2019-05-03 DIAGNOSIS — Z794 Long term (current) use of insulin: Secondary | ICD-10-CM

## 2019-05-03 DIAGNOSIS — E119 Type 2 diabetes mellitus without complications: Secondary | ICD-10-CM

## 2019-05-03 DIAGNOSIS — B961 Klebsiella pneumoniae [K. pneumoniae] as the cause of diseases classified elsewhere: Secondary | ICD-10-CM | POA: Diagnosis present

## 2019-05-03 DIAGNOSIS — Z79899 Other long term (current) drug therapy: Secondary | ICD-10-CM

## 2019-05-03 DIAGNOSIS — E113299 Type 2 diabetes mellitus with mild nonproliferative diabetic retinopathy without macular edema, unspecified eye: Secondary | ICD-10-CM | POA: Diagnosis present

## 2019-05-03 DIAGNOSIS — C240 Malignant neoplasm of extrahepatic bile duct: Secondary | ICD-10-CM | POA: Insufficient documentation

## 2019-05-03 DIAGNOSIS — Z87891 Personal history of nicotine dependence: Secondary | ICD-10-CM | POA: Insufficient documentation

## 2019-05-03 DIAGNOSIS — B181 Chronic viral hepatitis B without delta-agent: Secondary | ICD-10-CM | POA: Diagnosis present

## 2019-05-03 DIAGNOSIS — Z833 Family history of diabetes mellitus: Secondary | ICD-10-CM | POA: Diagnosis not present

## 2019-05-03 DIAGNOSIS — K829 Disease of gallbladder, unspecified: Secondary | ICD-10-CM | POA: Diagnosis not present

## 2019-05-03 DIAGNOSIS — Z03818 Encounter for observation for suspected exposure to other biological agents ruled out: Secondary | ICD-10-CM | POA: Diagnosis not present

## 2019-05-03 DIAGNOSIS — I10 Essential (primary) hypertension: Secondary | ICD-10-CM | POA: Insufficient documentation

## 2019-05-03 DIAGNOSIS — K810 Acute cholecystitis: Secondary | ICD-10-CM | POA: Diagnosis present

## 2019-05-03 DIAGNOSIS — Z5189 Encounter for other specified aftercare: Secondary | ICD-10-CM | POA: Insufficient documentation

## 2019-05-03 DIAGNOSIS — D696 Thrombocytopenia, unspecified: Secondary | ICD-10-CM | POA: Diagnosis present

## 2019-05-03 DIAGNOSIS — K828 Other specified diseases of gallbladder: Secondary | ICD-10-CM | POA: Diagnosis not present

## 2019-05-03 DIAGNOSIS — Z9221 Personal history of antineoplastic chemotherapy: Secondary | ICD-10-CM | POA: Diagnosis not present

## 2019-05-03 DIAGNOSIS — F329 Major depressive disorder, single episode, unspecified: Secondary | ICD-10-CM | POA: Insufficient documentation

## 2019-05-03 DIAGNOSIS — Z9889 Other specified postprocedural states: Secondary | ICD-10-CM | POA: Diagnosis not present

## 2019-05-03 DIAGNOSIS — E109 Type 1 diabetes mellitus without complications: Secondary | ICD-10-CM | POA: Insufficient documentation

## 2019-05-03 LAB — CBC WITH DIFFERENTIAL/PLATELET
Band Neutrophils: 12 %
Basophils Absolute: 0.1 10*3/uL (ref 0.0–0.1)
Basophils Relative: 1 %
Blasts: 0 %
Eosinophils Absolute: 0.1 10*3/uL (ref 0.0–0.5)
Eosinophils Relative: 1 %
HCT: 26.9 % — ABNORMAL LOW (ref 39.0–52.0)
Hemoglobin: 8.8 g/dL — ABNORMAL LOW (ref 13.0–17.0)
Lymphocytes Relative: 11 %
Lymphs Abs: 0.7 10*3/uL (ref 0.7–4.0)
MCH: 30.3 pg (ref 26.0–34.0)
MCHC: 32.7 g/dL (ref 30.0–36.0)
MCV: 92.8 fL (ref 80.0–100.0)
Metamyelocytes Relative: 0 %
Monocytes Absolute: 0.1 10*3/uL (ref 0.1–1.0)
Monocytes Relative: 2 %
Myelocytes: 0 %
Neutro Abs: 5.1 10*3/uL (ref 1.7–7.7)
Neutrophils Relative %: 73 %
Other: 0 %
Platelets: 122 10*3/uL — ABNORMAL LOW (ref 150–400)
Promyelocytes Relative: 0 %
RBC: 2.9 MIL/uL — ABNORMAL LOW (ref 4.22–5.81)
RDW: 14.9 % (ref 11.5–15.5)
WBC: 6.1 10*3/uL (ref 4.0–10.5)
nRBC: 0 % (ref 0.0–0.2)
nRBC: 0 /100 WBC

## 2019-05-03 LAB — COMPREHENSIVE METABOLIC PANEL
ALT: 19 U/L (ref 0–44)
AST: 16 U/L (ref 15–41)
Albumin: 3.4 g/dL — ABNORMAL LOW (ref 3.5–5.0)
Alkaline Phosphatase: 59 U/L (ref 38–126)
Anion gap: 10 (ref 5–15)
BUN: 30 mg/dL — ABNORMAL HIGH (ref 8–23)
CO2: 24 mmol/L (ref 22–32)
Calcium: 8.8 mg/dL — ABNORMAL LOW (ref 8.9–10.3)
Chloride: 99 mmol/L (ref 98–111)
Creatinine, Ser: 1.09 mg/dL (ref 0.61–1.24)
GFR calc Af Amer: >60 mL/min (ref >60)
GFR calc non Af Amer: >60 mL/min (ref >60)
Glucose, Bld: 253 mg/dL — ABNORMAL HIGH (ref 70–99)
Potassium: 4.2 mmol/L (ref 3.5–5.1)
Sodium: 133 mmol/L — ABNORMAL LOW (ref 135–145)
Total Bilirubin: 1 mg/dL (ref 0.3–1.2)
Total Protein: 6.8 g/dL (ref 6.5–8.1)

## 2019-05-03 LAB — CBC WITH DIFFERENTIAL (CANCER CENTER ONLY)
Abs Immature Granulocytes: 0.04 10*3/uL (ref 0.00–0.07)
Basophils Absolute: 0 10*3/uL (ref 0.0–0.1)
Basophils Relative: 0 %
Eosinophils Absolute: 0 10*3/uL (ref 0.0–0.5)
Eosinophils Relative: 0 %
HCT: 29.2 % — ABNORMAL LOW (ref 39.0–52.0)
Hemoglobin: 9.7 g/dL — ABNORMAL LOW (ref 13.0–17.0)
Immature Granulocytes: 1 %
Lymphocytes Relative: 13 %
Lymphs Abs: 0.9 10*3/uL (ref 0.7–4.0)
MCH: 30.6 pg (ref 26.0–34.0)
MCHC: 33.2 g/dL (ref 30.0–36.0)
MCV: 92.1 fL (ref 80.0–100.0)
Monocytes Absolute: 0.3 10*3/uL (ref 0.1–1.0)
Monocytes Relative: 5 %
Neutro Abs: 5.8 10*3/uL (ref 1.7–7.7)
Neutrophils Relative %: 81 %
Platelet Count: 156 10*3/uL (ref 150–400)
RBC: 3.17 MIL/uL — ABNORMAL LOW (ref 4.22–5.81)
RDW: 14.8 % (ref 11.5–15.5)
WBC Count: 7.1 10*3/uL (ref 4.0–10.5)
nRBC: 0 % (ref 0.0–0.2)

## 2019-05-03 LAB — CMP (CANCER CENTER ONLY)
ALT: 17 U/L (ref 0–44)
AST: 16 U/L (ref 15–41)
Albumin: 3.1 g/dL — ABNORMAL LOW (ref 3.5–5.0)
Alkaline Phosphatase: 65 U/L (ref 38–126)
Anion gap: 8 (ref 5–15)
BUN: 30 mg/dL — ABNORMAL HIGH (ref 8–23)
CO2: 27 mmol/L (ref 22–32)
Calcium: 9.6 mg/dL (ref 8.9–10.3)
Chloride: 99 mmol/L (ref 98–111)
Creatinine: 1.26 mg/dL — ABNORMAL HIGH (ref 0.61–1.24)
GFR, Est AFR Am: >60 mL/min (ref >60)
GFR, Estimated: 57 mL/min — ABNORMAL LOW (ref >60)
Glucose, Bld: 155 mg/dL — ABNORMAL HIGH (ref 70–99)
Potassium: 5.4 mmol/L — ABNORMAL HIGH (ref 3.5–5.1)
Sodium: 134 mmol/L — ABNORMAL LOW (ref 135–145)
Total Bilirubin: 0.6 mg/dL (ref 0.3–1.2)
Total Protein: 6.9 g/dL (ref 6.5–8.1)

## 2019-05-03 LAB — SARS CORONAVIRUS 2 BY RT PCR (HOSPITAL ORDER, PERFORMED IN ~~LOC~~ HOSPITAL LAB): SARS Coronavirus 2: NEGATIVE

## 2019-05-03 LAB — LIPASE, BLOOD: Lipase: 17 U/L (ref 11–51)

## 2019-05-03 LAB — LACTIC ACID, PLASMA: Lactic Acid, Venous: 1.2 mmol/L (ref 0.5–1.9)

## 2019-05-03 LAB — MAGNESIUM: Magnesium: 1.9 mg/dL (ref 1.7–2.4)

## 2019-05-03 MED ORDER — MORPHINE SULFATE (PF) 2 MG/ML IV SOLN: 2.0000 mg | INTRAVENOUS | Status: DC | PRN

## 2019-05-03 MED ORDER — SODIUM CHLORIDE 0.9 % IV BOLUS
1000.0000 mL | Freq: Once | INTRAVENOUS | Status: AC
  Administered 2019-05-03: 1000 mL via INTRAVENOUS

## 2019-05-03 MED ORDER — KETOROLAC TROMETHAMINE 15 MG/ML IJ SOLN
15.0000 mg | Freq: Four times a day (QID) | INTRAMUSCULAR | Status: DC | PRN
  Administered 2019-05-03 – 2019-05-04 (×2): 15 mg via INTRAVENOUS
  Filled 2019-05-03 (×2): qty 1

## 2019-05-03 MED ORDER — INSULIN ASPART 100 UNIT/ML ~~LOC~~ SOLN
0.0000 [IU] | Freq: Three times a day (TID) | SUBCUTANEOUS | Status: DC
  Administered 2019-05-04: 2 [IU] via SUBCUTANEOUS
  Administered 2019-05-04: 1 [IU] via SUBCUTANEOUS
  Administered 2019-05-05: 2 [IU] via SUBCUTANEOUS
  Administered 2019-05-05: 5 [IU] via SUBCUTANEOUS
  Administered 2019-05-05: 1 [IU] via SUBCUTANEOUS
  Filled 2019-05-03: qty 0.09

## 2019-05-03 MED ORDER — MORPHINE SULFATE (PF) 4 MG/ML IV SOLN
4.0000 mg | Freq: Once | INTRAVENOUS | Status: AC
  Administered 2019-05-03: 4 mg via INTRAVENOUS
  Filled 2019-05-03: qty 1

## 2019-05-03 MED ORDER — ENOXAPARIN SODIUM 40 MG/0.4ML ~~LOC~~ SOLN
40.0000 mg | SUBCUTANEOUS | Status: DC
  Administered 2019-05-04: 40 mg via SUBCUTANEOUS
  Filled 2019-05-03 (×2): qty 0.4

## 2019-05-03 MED ORDER — PIPERACILLIN-TAZOBACTAM 3.375 G IVPB 30 MIN
3.3750 g | Freq: Once | INTRAVENOUS | Status: AC
  Administered 2019-05-03: 3.375 g via INTRAVENOUS
  Filled 2019-05-03: qty 50

## 2019-05-03 MED ORDER — SODIUM CHLORIDE 0.9 % IV SOLN
INTRAVENOUS | Status: DC
  Administered 2019-05-03 – 2019-05-04 (×5): via INTRAVENOUS

## 2019-05-03 MED ORDER — ZOLPIDEM TARTRATE 5 MG PO TABS
5.0000 mg | ORAL_TABLET | Freq: Every evening | ORAL | Status: DC | PRN
  Administered 2019-05-03: 5 mg via ORAL
  Filled 2019-05-03: qty 1

## 2019-05-03 MED ORDER — SODIUM CHLORIDE 0.9 % IV SOLN
1.0000 g | Freq: Three times a day (TID) | INTRAVENOUS | Status: DC
  Administered 2019-05-03 – 2019-05-04 (×2): 1 g via INTRAVENOUS
  Filled 2019-05-03 (×4): qty 1

## 2019-05-03 NOTE — ED Notes (Signed)
Port accessed, per pt request.  Port flushed easily, provided good blood return.

## 2019-05-03 NOTE — Consult Note (Signed)
Re:   Christopher Welch DOB:   1946/06/21 MRN:   [id number]  Chief Complaint Abdominal pain  ASSESEMENT AND PLAN: 1.  Possible cholecystitis, gall bladder sludge.  His symptoms are mild.  Would consider HIDA scan to see if he has cystic duct obstruction.  Will follow  2.  Cholangiocarcinoma  Diagnosed in April 2020 on ERCP by Dr. Carlye Grippe Dr. Burr Medico for oncology  Was seen at [facility], but switching to [facility], Dr. Carlis Abbott, at the end of August.  3.  DM, insulin dependent x 29 years  Bld glucose - 253 - 05/03/2019 4.  HTN 5.  History of Hep B 6.  CAD  Followed by Dr. Ellyn Hack 7.  Anemia  Hgb - 8.8 - 05/03/2019  Chief Complaint  Patient presents with  . Abdominal Pain   PHYSICIAN REQUESTING CONSULTATION:  Dr. Darl Householder, Karenann Cai  HISTORY OF PRESENT ILLNESS: Christopher Welch is a 73 y.o. (DOB: 1946/06/14)  white male whose primary care physician is [name], DO.  He is by himself.   He had his last chemotx last Wednesday, 04/28/2019.  Then on Friday, 04/30/2019, he developed mild RUQ pain.  He has a "pulling sensation".  He has had no fever or vomiting.  His symptoms are mild and maybe a little better since Friday.  The pateint had a cholangiocarcinoma diagnosed by Dr. Benson Norway on and ERCP in April 2020.    He was seen by Dr. Zenia Resides at [facility].  He had a laparotomy by Dr. Zenia Resides on 02/10/2019, but Dr. Zenia Resides could not resect the tumor.  He is now followed and treated by Dr. Burr Medico.  His CA 19-9 has improved from 682 in 01/21/2019 to 131 on 03/31/2019.  His last chemotx was last Wednesday, 04/28/2019.  He is to transfer his care to Dr. Carlis Abbott at [facility] later this month.  His WBC - 6,100 - 05/03/2019 [place] of abdomen -   1. Mildly distended gallbladder with large amount of echogenic sludge. There is also moderate gallbladder wall thickening. Could not exclude a calculus cholecystitis.  2. Difficult to identify and measure the common bile duct. Patient has a known plastic common bile duct stent in place and there  is no obvious biliary dilatation.  3. No worrisome hepatic lesions.   Past Medical History:  Diagnosis Date  . Diabetes mellitus type I, controlled ([place])   . Dyslipidemia   . ED (erectile dysfunction)   . Essential hypertension   . Gilbert's disease   . Hepatitis B carrier [facility])       Past Surgical History:  Procedure Laterality Date  . APPENDECTOMY  1964  . BILIARY BRUSHING  01/21/2019   Procedure: BILIARY BRUSHING;  Surgeon: Carol Ada, MD;  [facility];  Service: Endoscopy;;  . BILIARY STENT PLACEMENT N/A 01/21/2019   Procedure: BILIARY STENT PLACEMENT;  Surgeon: Carol Ada, MD;  [facility];  Service: Endoscopy;  Laterality: N/A;  . ENDOSCOPIC RETROGRADE CHOLANGIOPANCREATOGRAPHY (ERCP) WITH PROPOFOL N/A 01/21/2019   Procedure: ENDOSCOPIC RETROGRADE CHOLANGIOPANCREATOGRAPHY (ERCP) WITH PROPOFOL;  Surgeon: Carol Ada, MD;  [facility];  Service: Endoscopy;  Laterality: N/A;  . ESOPHAGOGASTRODUODENOSCOPY (EGD) WITH PROPOFOL N/A 01/21/2019   Procedure: ESOPHAGOGASTRODUODENOSCOPY (EGD) WITH PROPOFOL;  Surgeon: Carol Ada, MD;  [facility];  Service: Endoscopy;  Laterality: N/A;  . FEMORAL HERNIA REPAIR    . IR IMAGING GUIDED PORT INSERTION  03/08/2019  . LEFT HEART CATH AND CORONARY ANGIOGRAPHY N/A 05/19/2017   Procedure: LEFT HEART CATH  AND CORONARY ANGIOGRAPHY;  Surgeon: Leonie Man, MD;  Location: [facility];  Service: Cardiovascular;  Laterality: N/A;  . SPHINCTEROTOMY  01/21/2019   Procedure: SPHINCTEROTOMY;  Surgeon: Carol Ada, MD;  [facility];  Service: Endoscopy;;  . UPPER ESOPHAGEAL ENDOSCOPIC ULTRASOUND (EUS) N/A 01/21/2019   Procedure: UPPER ESOPHAGEAL ENDOSCOPIC ULTRASOUND (EUS);  Surgeon: Carol Ada, MD;  Location[facility];  Service: Endoscopy;  Laterality: N/A;      Current Facility-Administered Medications  Medication Dose Route Frequency Provider Last Rate Last Dose  . 0.9 %   sodium chloride infusion   Intravenous Continuous Elwyn Reach, MD 100 mL/hr at 05/03/19 1912    . ketorolac (TORADOL) 15 MG/ML injection 15 mg  15 mg Intravenous Q6H PRN Elwyn Reach, MD   15 mg at 05/03/19 1852  . meropenem (MERREM) 1 g in sodium chloride 0.9 % 100 mL IVPB  1 g Intravenous Q8H Garba, Mohammad L, MD 200 mL/hr at 05/03/19 1917 1 g at 05/03/19 1917  . morphine 2 MG/ML injection 2 mg  2 mg Intravenous Q3H PRN Elwyn Reach, MD       Current Outpatient Medications  Medication Sig Dispense Refill  . amLODipine (NORVASC) 5 MG tablet Take 5 mg by mouth daily.     Marland Kitchen atenolol (TENORMIN) 25 MG tablet Take by mouth daily. Once a day    . atorvastatin (LIPITOR) 10 MG tablet Take 10 mg by mouth daily.    . insulin NPH Human (NOVOLIN N) 100 UNIT/ML injection Inject 20 Units into the skin 2 (two) times a day.     . insulin regular (NOVOLIN R) 100 units/mL injection Inject 0-40 Units into the skin 3 (three) times daily before meals. Sliding scale 0-40 units    . lisinopril (ZESTRIL) 20 MG tablet Take 20 mg by mouth daily.     . magnesium oxide (MAG-OX) 400 (241.3 Mg) MG tablet Take 1 tablet (400 mg total) by mouth daily. 30 tablet 2  . metFORMIN (GLUCOPHAGE) 1000 MG tablet Take 2,000 mg by mouth every evening.     . ondansetron (ZOFRAN) 8 MG tablet TAKE 1 TABLET BY MOUTH 2 TIMES DAY AS NEEDED. START ON 3RD DAY AFTER CHEMOTHERAPY (Patient taking differently: Take 8 mg by mouth 2 (two) times daily as needed. Start on 3rd day after chemotherapy) 30 tablet 1  . oxycodone (OXY-IR) 5 MG capsule Take 1 capsule (5 mg total) by mouth every 6 (six) hours as needed for pain. 30 capsule 0  . prochlorperazine (COMPAZINE) 10 MG tablet TAKE 1 TABLET (10 MG TOTAL) BY MOUTH EVERY 6 (SIX) HOURS AS NEEDED (NAUSEA OR VOMITING). (Patient taking differently: Take 10 mg by mouth every 6 (six) hours as needed for nausea or vomiting (Nausea or vomiting). ) 30 tablet 1  . zolpidem (AMBIEN) 5 MG tablet Take  1-2 tablets (5-10 mg total) by mouth at bedtime as needed for sleep. 60 tablet 0  . Blood Glucose Monitoring Suppl (ACCU-CHEK NANO SMARTVIEW) W/DEVICE KIT 1 kit by Does not apply route 2 (two) times daily. (Patient taking differently: 1 kit by Does not apply route See admin instructions. Test blood sugars 12x's daily) 1 kit 0  . glucose blood test strip Test 3 times a day. (Patient taking differently: 1 each by Other route See admin instructions. Test 12 times a day.) 300 each Prn  . lidocaine-prilocaine (EMLA) cream Apply to affected area once (Patient not taking: Reported on 05/03/2019) 30 g 3  Allergies  Allergen Reactions  . Erythromycin     Severe Abdominal pain    REVIEW OF SYSTEMS: Skin:  No history of rash.  No history of abnormal moles. Infection:  No history of hepatitis or HIV.  No history of MRSA. Neurologic:  No history of stroke.  No history of seizure.  No history of headaches. Cardiac:  CAD and HTN.  Followed by Dr. Ellyn Hack. Pulmonary:  Does not smoke cigarettes.  No asthma or bronchitis.  No OSA/CPAP.  Endocrine:  DM Gastrointestinal:  See HPI.  He had a colonoscopy by Dr. Michail Sermon in 2018. Urologic:  No history of kidney stones.  No history of bladder infections. Musculoskeletal:  No history of joint or back disease. Hematologic:  No bleeding disorder.  No history of anemia.  Not anticoagulated. Psycho-social:  The patient is oriented.   The patient has no obvious psychologic or social impairment to understanding our conversation and plan.  SOCIAL and FAMILY HISTORY: Single. Lives by self.  No children. Has a sister in [place], but does not talk to her much. His medical power of atty is Coralyn Helling, a friend.  PHYSICAL EXAM: BP 133/67 (BP Location: Right Arm)   Pulse 95   Temp 99.1 F (37.3 C) (Oral)   Resp 16   Ht _0  (1.702 m)   Wt 66.3 kg   SpO2 100%   BMI 22.90 kg/m   General: Thinner older WM who is alert.  He has dark hair.  He is wearing  a mask. Skin:  Inspection and palpation - no mass or rash. Eyes:  Conjunctiva and lids unremarkable.            Pupils are equal Ears, Nose, Mouth, and Throat:  Cannot evaluate. Neck: Supple. No mass, trachea midline.  No thyroid mass. Lymph Nodes:  No supraclavicular, cervical, or inguinal nodes. Lungs: Normal respiratory effort.  Clear to auscultation and symmetric breath sounds.  Has a port in the right upper chest. Heart:  Palpation of the heart is normal.            Auscultation: RRR. No murmur or rub.  Abdomen: Soft. Well healed upper midline scar (which is recent).  No localized tenderness. Rectal: Not done. Musculoskeletal:  Good muscle strength and ROM  in upper and lower extremities.  Neurologic:  Grossly intact to motor and sensory function. Psychiatric: Normal judgement and insight. Behavior is normal.            Oriented to time, person, place.   DATA REVIEWED, COUNSELING AND COORDINATION OF CARE: Epic notes reviewed. Counseling and coordination of care exceeded more than 50% of the time spent with patient. Total time spent with patient and charting: 45 minutes  Alphonsa Overall, MD,  [facility], [address]  [place], [place]    [place] Phone:  [phone] FAX:  [phone]

## 2019-05-03 NOTE — Telephone Encounter (Signed)
Patients call with complaint of having sharp pains under rib cage and down right side for 3 days now.  He states the pain has not let up. Denies N/V/D, no constipation, no fevers.  Spoke with Dr. Burr Medico and Sandi Mealy PA, patient will come in and be seen in [facility], appointment of lab and [facility] made.

## 2019-05-03 NOTE — Progress Notes (Signed)
Pharmacy Antibiotic Note  EESA JUSTISS is a 73 y.o. male admitted on 05/03/2019 with with PMH of cholangiocarcinoma, hypertension, type 2 diabetes who presents with 3-day duration of right upper quadrant abdominal pain  Pharmacy has been consulted for merrem dosing.  Plan: merrem 1gm IV q8h Follow renal function, cultures and clinical course  Height: 5\' 7"  (170.2 cm) Weight: 146 lb 3.2 oz (66.3 kg) IBW/kg (Calculated) : 66.1  Temp (24hrs), Avg:98.6 F (37 C), Min:98 F (36.7 C), Max:99.1 F (37.3 C)  Recent Labs  Lab 04/28/19 0825 05/03/19 1030 05/03/19 1617 05/03/19 1622  WBC 6.1 7.1  --  6.1  CREATININE 1.21 1.26*  --  1.09  LATICACIDVEN  --   --  1.2  --     Estimated Creatinine Clearance: 58.1 mL/min (by C-G formula based on SCr of 1.09 mg/dL).    Allergies  Allergen Reactions  . Erythromycin     Severe Abdominal pain    Antimicrobials this admission: 8/3 zosyn x 1 8/3 merrem >>  Dose adjustments this admission:   Microbiology results: 8/3 BCx:    Thank you for allowing pharmacy to be a part of this patient's care.  Dolly Rias RPh 05/03/2019, 6:24 PM

## 2019-05-03 NOTE — Progress Notes (Signed)
Pt returned from [place] to [facility] to discuss results with PA Lucianne Lei.  Pt to be admitted through [facility].  Report called by phone by PA Lucianne Lei to ED Charge RN.  Pt escorted by PA Lucianne Lei via [facility] to ED triage area with belongings.

## 2019-05-03 NOTE — Progress Notes (Addendum)
Cholangiocarcinoma  Symptoms Management Clinic Progress Note   Christopher Welch [id number] [birth year] 73 y.o.  Christopher Welch is managed by Dr. [name]  Actively treated with chemotherapy/immunotherapy/hormonal therapy: yes  Current therapy: Cisplatin, gemcitabine, and Abraxane with [name] support.  Last treated: 04/30/2019 (cycle 3, day 9)  Next scheduled appointment with provider:  05/13/2019  Assessment: Plan:    Cholangiocarcinoma ([place])  Right upper quadrant abdominal pain   Extrahepatic cholangiocarcinoma: Christopher Welch is status post cycle 3, day 1 of cisplatin, gemcitabine, and Abraxane with [name] support.  Christopher Welch completed this last cycle on 04/30/2019.  Right upper quadrant pain: The patient was referred for an ultrasound of the gallbladder which returned showing: 1) Mildly distended gallbladder with large amount of echogenic sludge.  There is also moderate gallbladder wall thickening.  Could not exclude a calculus cholecystitis. 2) Difficult to identify a measure of the common bile duct.  Patient has a known plastic common bile duct stent in place and there is no obvious biliary dilatation. 3) No worrisome hepatic lesions noted.  This case was discussed with general surgery.  They recommended that the patient be taken to the emergency room at [facility] for further imaging studies, pain management, consideration of IV antibiotics, and possible admission.  Please see After Visit Summary for patient specific instructions.  Future Appointments  Date Time Provider [place]  05/13/2019  9:00 AM [facility] LAB 1 [facility] None  05/13/2019  9:15 AM [facility] [place] FLUSH [facility] None  05/13/2019  9:30 AM [name], MD [facility] None  05/13/2019 10:00 AM [facility] INFUSION [facility] None  05/20/2019  8:30 AM [facility] LAB 6 [facility] None  05/20/2019  8:45 AM [facility] [place] FLUSH [facility] None  05/20/2019  9:15 AM Alla Feeling, NP [facility] None   05/20/2019 10:00 AM [facility] INFUSION [facility] None  05/22/2019  8:30 AM [facility] [place] FLUSH [facility] None    No orders of the defined types were placed in this encounter.      Subjective:   Patient ID:  Christopher Welch is a 73 y.o. (DOB Feb 10, 1946) male.  Chief Complaint:  Chief Complaint  Patient presents with  . Chest Pain    Right Rib Pain    HPI Christopher Welch is a 73 year old male with a history of extrahepatic cholangiocarcinoma. Christopher Welch is status post cycle 3, day 1 of cisplatin, gemcitabine, and Abraxane with [name] support.  Christopher Welch completed this last cycle on 04/30/2019.  Christopher Welch contacted our office this morning stating that Christopher Welch was having sharp pain under his right rib cage with radiation inferiorly for the past 3 days.  Christopher Welch reported that his pain started on Friday after receiving his [name] injection.  Christopher Welch took oxycodone without much improvement in his pain.  Christopher Welch rates his pain as 8/10 and notes that is when Christopher Welch is attempting to get up from a seated position.  Christopher Welch reports Christopher Welch continues to note pain in his right upper quadrant when Christopher Welch is walking.  This pain resolves only when Christopher Welch is sitting or lying flat.  His last bowel movement was on Friday.  Christopher Welch reports that it is not uncommon for him to go several days without a bowel movement.  His stools have been light in color.  His urine has not been bright orange. Christopher Welch denies nausea, vomiting, diarrhea, constipation, fevers, chills, or sweats.  Medications: I have reviewed the patient's current medications.  Allergies:  Allergies  Allergen Reactions  . Erythromycin  Severe Abdominal pain    Past Medical History:  Diagnosis Date  . Diabetes mellitus type I, controlled ([place])   . Dyslipidemia   . ED (erectile dysfunction)   . Essential hypertension   . Gilbert's disease   . Hepatitis B carrier [facility])     Past Surgical History:  Procedure Laterality Date  . APPENDECTOMY  1964  . BILIARY BRUSHING  01/21/2019    Procedure: BILIARY BRUSHING;  Surgeon: Carol Ada, MD;  [facility];  Service: Endoscopy;;  . BILIARY STENT PLACEMENT N/A 01/21/2019   Procedure: BILIARY STENT PLACEMENT;  Surgeon: Carol Ada, MD;  [facility];  Service: Endoscopy;  Laterality: N/A;  . ENDOSCOPIC RETROGRADE CHOLANGIOPANCREATOGRAPHY (ERCP) WITH PROPOFOL N/A 01/21/2019   Procedure: ENDOSCOPIC RETROGRADE CHOLANGIOPANCREATOGRAPHY (ERCP) WITH PROPOFOL;  Surgeon: Carol Ada, MD;  [facility];  Service: Endoscopy;  Laterality: N/A;  . ESOPHAGOGASTRODUODENOSCOPY (EGD) WITH PROPOFOL N/A 01/21/2019   Procedure: ESOPHAGOGASTRODUODENOSCOPY (EGD) WITH PROPOFOL;  Surgeon: Carol Ada, MD;  [facility];  Service: Endoscopy;  Laterality: N/A;  . FEMORAL HERNIA REPAIR    . IR IMAGING GUIDED PORT INSERTION  03/08/2019  . LEFT HEART CATH AND CORONARY ANGIOGRAPHY N/A 05/19/2017   Procedure: LEFT HEART CATH AND CORONARY ANGIOGRAPHY;  Surgeon: Leonie Man, MD;  Location: [facility];  Service: Cardiovascular;  Laterality: N/A;  . SPHINCTEROTOMY  01/21/2019   Procedure: SPHINCTEROTOMY;  Surgeon: Carol Ada, MD;  [facility];  Service: Endoscopy;;  . UPPER ESOPHAGEAL ENDOSCOPIC ULTRASOUND (EUS) N/A 01/21/2019   Procedure: UPPER ESOPHAGEAL ENDOSCOPIC ULTRASOUND (EUS);  Surgeon: Carol Ada, MD;  [facility];  Service: Endoscopy;  Laterality: N/A;    Family History  Problem Relation Age of Onset  . Diabetes Father     Social History   Socioeconomic History  . Marital status: Single    Spouse name: Not on file  . Number of children: Not on file  . Years of education: Not on file  . Highest education level: Not on file  Occupational History  . Not on file  Social Needs  . Financial resource strain: Not on file  . Food insecurity    Worry: Not on file    Inability: Not on file  . Transportation needs    Medical: Not on file    Non-medical: Not on file   Tobacco Use  . Smoking status: Never Smoker  . Smokeless tobacco: Never Used  Substance and Sexual Activity  . Alcohol use: Yes    Alcohol/week: 7.0 standard drinks    Types: 7 Glasses of wine per week  . Drug use: No  . Sexual activity: Yes  Lifestyle  . Physical activity    Days per week: Not on file    Minutes per session: Not on file  . Stress: Not on file  Relationships  . Social Herbalist on phone: Not on file    Gets together: Not on file    Attends religious service: Not on file    Active member of club or organization: Not on file    Attends meetings of clubs or organizations: Not on file    Relationship status: Not on file  . Intimate partner violence    Fear of current or ex partner: Not on file    Emotionally abused: Not on file    Physically abused: Not on file    Forced sexual activity: Not on file  Other Topics Concern  . Not on file  Social History Narrative   Lives alone in a one story home - "partner" No children.    .  Works part time at [employer].   Education: masters in education   Quit smoking in 1984. Did have passive smoke exposure prior to that with his father.   Drains 5 glasses of wine per week. 2 cups of coffee daily.   Works out at [employer] regularly - 5 days per week.    Past Medical History, Surgical history, Social history, and Family history were reviewed and updated as appropriate.   Please see review of systems for further details on the patient's review from today.   Review of Systems:  Review of Systems  Constitutional: Negative for activity change, appetite change, chills, diaphoresis and fever.  Respiratory: Negative for cough, chest tightness and shortness of breath.   Cardiovascular: Negative for chest pain, palpitations and leg swelling.  Gastrointestinal: Positive for abdominal pain (Right upper quadrant pain.). Negative for abdominal distention, constipation, diarrhea, nausea and vomiting.    Objective:    Physical Exam:  BP 118/66 (BP Location: Left Arm, Patient Position: Sitting)   Pulse 90   Temp 98 F (36.7 C) (Oral)   Resp 18   Ht 5\' 8"  (1.727 m)   Wt 146 lb 11.2 oz (66.5 kg)   SpO2 98%   BMI 22.31 kg/m  ECOG: 1  Physical Exam Constitutional:      General: Christopher Welch is not in acute distress.    Appearance: Christopher Welch is not diaphoretic.  HENT:     Head: Normocephalic and atraumatic.  Cardiovascular:     Rate and Rhythm: Normal rate and regular rhythm.     Heart sounds: Normal heart sounds. No murmur. No friction rub. No gallop.   Pulmonary:     Effort: Pulmonary effort is normal. No respiratory distress.     Breath sounds: Normal breath sounds. No wheezing or rales.  Abdominal:     General: Bowel sounds are normal. There is no distension.     Palpations: Abdomen is soft. There is no mass.     Tenderness: There is abdominal tenderness (Right upper quadrant.). There is no guarding or rebound.  Musculoskeletal:     Comments: Tenderness noted over the inferior right lateral ribs.  Skin:    General: Skin is warm and dry.  Neurological:     General: No focal deficit present.     Mental Status: Christopher Welch is alert.  Psychiatric:        Mood and Affect: Mood normal.        Behavior: Behavior normal.     Lab Review:     Component Value Date/Time   NA 138 04/28/2019 0825   NA 141 05/14/2017 0809   K 4.4 04/28/2019 0825   CL 105 04/28/2019 0825   CO2 23 04/28/2019 0825   GLUCOSE 171 (H) 04/28/2019 0825   BUN 25 (H) 04/28/2019 0825   BUN 11 05/14/2017 0809   CREATININE 1.21 04/28/2019 0825   CREATININE 1.16 12/05/2011 1117   CALCIUM 9.3 04/28/2019 0825   PROT 6.7 04/28/2019 0825   ALBUMIN 3.4 (L) 04/28/2019 0825   AST 17 04/28/2019 0825   ALT 19 04/28/2019 0825   ALKPHOS 62 04/28/2019 0825   BILITOT 0.4 04/28/2019 0825   GFRNONAA 60 (L) 04/28/2019 0825   GFRAA >60 04/28/2019 0825       Component Value Date/Time   WBC 7.1 05/03/2019 1030   WBC 12.8 (H) 03/08/2019 1254   RBC  3.17  (L) 05/03/2019 1030   HGB 9.7 (L) 05/03/2019 1030   HGB 14.3 05/14/2017 0809   HCT 29.2 (L) 05/03/2019 1030   HCT 41.4 05/14/2017 0809   PLT 156 05/03/2019 1030   PLT 228 05/14/2017 0809   MCV 92.1 05/03/2019 1030   MCV 95 05/14/2017 0809   MCH 30.6 05/03/2019 1030   MCHC 33.2 05/03/2019 1030   RDW 14.8 05/03/2019 1030   RDW 13.9 05/14/2017 0809   LYMPHSABS 0.9 05/03/2019 1030   MONOABS 0.3 05/03/2019 1030   EOSABS 0.0 05/03/2019 1030   BASOSABS 0.0 05/03/2019 1030   -------------------------------  Imaging from last 24 hours (if applicable):  Radiology interpretation: No results found.      This patient was seen with Dr. Burr Medico with my treatment plan reviewed with her. She expressed agreement with my medical management of this patient.  Addendum  I have seen the patient, examined him. I agree with the assessment and and plan and have edited the notes.   I reviewed his lab and [place] results, which is concerning for acute cholecystitis, Christopher Welch has Percell Miller sign on exam. Will send him to ED for further evaluation and possible admission. I will f/u in hospital. Christopher Welch agrees with the plan.   [name]  05/03/2019

## 2019-05-03 NOTE — ED Notes (Addendum)
Attempted to collect CBG specimen. Pt has personal insulin pump/glucometer that measures CBG every 47mins. Last reading was 211mg /dl. Kallie Locks, RN notified.

## 2019-05-03 NOTE — H&P (Signed)
History and Physical   Christopher Welch HBZ:[id number] DOB: 1946-08-28 DOA: 05/03/2019  Referring MD/NP/PA: Dr. Oley Balm  PCP: [name], DO   Outpatient Specialists: Oncology Dr. Burr Medico  Patient coming from: Home  Chief Complaint: Abdominal pain  HPI: Christopher Welch is a 73 y.o. male with medical history significant of diabetes, hypertension, hyperlipidemia, cholangiocarcinoma currently on chemotherapy.  He is status post 3 cycle and currently on cisplatin, gemcitabine and Abraxane with [name] support which was started today who started having significant right upper quadrant abdominal pain rated as 10 out of 10.  Had office visit with his oncology today.  He was evaluated with right upper quadrant abdominal ultrasound which came back with mildly distended gallbladder large amount of echogenic sludge and moderate gallbladder wall thickening.  Suspicion for calculus cholecystitis was done.  Discussed with surgeon who recommended admission with IV antibiotics.  Patient is feeling better after pain management in the ER.  He has been admitted to the hospital with acute cholecystitis to be followed by surgery and oncology..  ED Course: Temperature 99.1 blood pressure 140/79 pulse 97 respiratory rate of 18 oxygen sats 98% room air.  White count 6.1 hemoglobin 8.8 and platelets 122.  Sodium 133 potassium 4.2 chloride 99.  BUN of 30 and creatinine 1.09 calcium 8.8 and glucose 253.  Abdominal ultrasound shows findings as above.  Patient is being admitted for treatment.  Review of Systems: As per HPI otherwise 10 point review of systems negative.    Past Medical History:  Diagnosis Date  . Diabetes mellitus type I, controlled ([place])   . Dyslipidemia   . ED (erectile dysfunction)   . Essential hypertension   . Gilbert's disease   . Hepatitis B carrier [facility])     Past Surgical History:  Procedure Laterality Date  . APPENDECTOMY  1964  . BILIARY BRUSHING  01/21/2019   Procedure: BILIARY BRUSHING;   Surgeon: Carol Ada, MD;  Location: [facility];  Service: Endoscopy;;  . BILIARY STENT PLACEMENT N/A 01/21/2019   Procedure: BILIARY STENT PLACEMENT;  Surgeon: Carol Ada, MD;  Location: [facility];  Service: Endoscopy;  Laterality: N/A;  . ENDOSCOPIC RETROGRADE CHOLANGIOPANCREATOGRAPHY (ERCP) WITH PROPOFOL N/A 01/21/2019   Procedure: ENDOSCOPIC RETROGRADE CHOLANGIOPANCREATOGRAPHY (ERCP) WITH PROPOFOL;  Surgeon: Carol Ada, MD;  Location: [facility];  Service: Endoscopy;  Laterality: N/A;  . ESOPHAGOGASTRODUODENOSCOPY (EGD) WITH PROPOFOL N/A 01/21/2019   Procedure: ESOPHAGOGASTRODUODENOSCOPY (EGD) WITH PROPOFOL;  Surgeon: Carol Ada, MD;  Location: [facility];  Service: Endoscopy;  Laterality: N/A;  . FEMORAL HERNIA REPAIR    . IR IMAGING GUIDED PORT INSERTION  03/08/2019  . LEFT HEART CATH AND CORONARY ANGIOGRAPHY N/A 05/19/2017   Procedure: LEFT HEART CATH AND CORONARY ANGIOGRAPHY;  Surgeon: Leonie Man, MD;  Location: [facility];  Service: Cardiovascular;  Laterality: N/A;  . SPHINCTEROTOMY  01/21/2019   Procedure: SPHINCTEROTOMY;  Surgeon: Carol Ada, MD;  Location: [facility];  Service: Endoscopy;;  . UPPER ESOPHAGEAL ENDOSCOPIC ULTRASOUND (EUS) N/A 01/21/2019   Procedure: UPPER ESOPHAGEAL ENDOSCOPIC ULTRASOUND (EUS);  Surgeon: Carol Ada, MD;  Location: [facility];  Service: Endoscopy;  Laterality: N/A;     reports that he has never smoked. He has never used smokeless tobacco. He reports current alcohol use of about 7.0 standard drinks of alcohol per week. He reports that he does not use drugs.  Allergies  Allergen Reactions  . Erythromycin     Severe Abdominal pain    Family History  Problem Relation Age of  Onset  . Diabetes Father      Prior to Admission medications   Medication Sig Start Date End Date Taking? Authorizing Provider  amLODipine (NORVASC) 5 MG tablet Take 5 mg by mouth daily.     [provider]  atenolol (TENORMIN)  25 MG tablet Take by mouth daily. Once a day    [provider]  atorvastatin (LIPITOR) 10 MG tablet Take 10 mg by mouth daily. 05/13/17   [provider]  Blood Glucose Monitoring Suppl (ACCU-CHEK NANO SMARTVIEW) W/DEVICE KIT 1 kit by Does not apply route 2 (two) times daily. Patient taking differently: 1 kit by Does not apply route See admin instructions. Test blood sugars 12x's daily 10/23/12   Denita Lung, MD  diphenhydrAMINE (BENADRYL) 50 MG capsule Take by mouth.    [provider]  DiphenhydrAMINE HCl (ZZZQUIL) 50 MG/30ML LIQD Take 60 mLs by mouth at bedtime.    [provider]  glucose blood test strip Test 3 times a day. Patient taking differently: 1 each by Other route See admin instructions. Test 12 times a day. 10/26/12   Denita Lung, MD  insulin NPH Human (NOVOLIN N) 100 UNIT/ML injection Inject 10 Units into the skin 2 (two) times a day.    [provider]  insulin regular (NOVOLIN R) 100 units/mL injection Inject 10 Units into the skin 3 (three) times daily before meals.    [provider]  lidocaine-prilocaine (EMLA) cream Apply to affected area once 03/08/19   [name], MD  lisinopril (ZESTRIL) 20 MG tablet Take 20 mg by mouth daily.  03/14/17   [provider]  magnesium oxide (MAG-OX) 400 (241.3 Mg) MG tablet Take 1 tablet (400 mg total) by mouth daily. 03/12/19   [name], MD  metFORMIN (GLUCOPHAGE) 1000 MG tablet Take 2,000 mg by mouth every evening.     [provider]  Niacin CR (SLO-NIACIN) 750 MG TBCR Take 1,500 mg by mouth at bedtime.    [provider]  ondansetron (ZOFRAN) 8 MG tablet TAKE 1 TABLET BY MOUTH 2 TIMES DAY AS NEEDED. START ON 3RD DAY AFTER CHEMOTHERAPY 04/19/19   [name], MD  oxycodone (OXY-IR) 5 MG capsule Take 1 capsule (5 mg total) by mouth every 6 (six) hours as needed for pain. 04/08/19   Alla Feeling, NP  prochlorperazine (COMPAZINE) 10 MG tablet TAKE 1 TABLET (10 MG  TOTAL) BY MOUTH EVERY 6 (SIX) HOURS AS NEEDED (NAUSEA OR VOMITING). 04/19/19   [name], MD  zolpidem (AMBIEN) 5 MG tablet Take 1-2 tablets (5-10 mg total) by mouth at bedtime as needed for sleep. 04/01/19   [name], MD  citalopram (CELEXA) 20 MG tablet Take 1 tablet (20 mg total) by mouth daily. 02/26/11 12/05/11  Denita Lung, MD  clonazePAM (KLONOPIN) 0.5 MG tablet Take 0.5 mg by mouth 2 (two) times daily as needed.    12/05/11  [provider]  lamoTRIgine (LAMICTAL) 100 MG tablet Take 100 mg by mouth daily. 1/2 TABLET QD   12/05/11  [provider]    Physical Exam: Vitals:   05/03/19 1630 05/03/19 1700 05/03/19 1730 05/03/19 1800  BP: (!) 144/72 (!) 144/79 139/77 137/75  Pulse: 95 94 93 92  Resp: 16 18 14 15   Temp:      TempSrc:      SpO2: 100% 98% 99% 98%  Weight:      Height:          Constitutional:  NAD, calm, comfortable Vitals:   05/03/19 1630 05/03/19 1700 05/03/19 1730 05/03/19 1800  BP: (!) 144/72 (!) 144/79 139/77 137/75  Pulse: 95 94 93 92  Resp: 16 18 14 15   Temp:      TempSrc:      SpO2: 100% 98% 99% 98%  Weight:      Height:       Eyes: PERRL, lids and conjunctivae normal ENMT: Mucous membranes are moist. Posterior pharynx clear of any exudate or lesions.Normal dentition.  Neck: normal, supple, no masses, no thyromegaly Respiratory: clear to auscultation bilaterally, no wheezing, no crackles. Normal respiratory effort. No accessory muscle use.  Cardiovascular: Regular rate and rhythm, no murmurs / rubs / gallops. No extremity edema. 2+ pedal pulses. No carotid bruits.  Abdomen: Right upper quadrant abdominal tenderness, no masses palpated. No hepatosplenomegaly. Bowel sounds positive.  Musculoskeletal: no clubbing / cyanosis. No joint deformity upper and lower extremities. Good ROM, no contractures. Normal muscle tone.  Skin: no rashes, lesions, ulcers. No induration Neurologic: CN 2-12 grossly intact. Sensation intact, DTR normal. Strength  5/5 in all 4.  Psychiatric: Normal judgment and insight. Alert and oriented x 3. Normal mood.     Labs on Admission: I have personally reviewed following labs and imaging studies  CBC: Recent Labs  Lab 04/28/19 0825 05/03/19 1030 05/03/19 1622  WBC 6.1 7.1 6.1  NEUTROABS 3.7 5.8 5.1  HGB 9.7* 9.7* 8.8*  HCT 29.1* 29.2* 26.9*  MCV 91.2 92.1 92.8  PLT 242 156 536*   Basic Metabolic Panel: Recent Labs  Lab 04/28/19 0825 05/03/19 1030 05/03/19 1622  NA 138 134* 133*  K 4.4 5.4* 4.2  CL 105 99 99  CO2 23 27 24   GLUCOSE 171* 155* 253*  BUN 25* 30* 30*  CREATININE 1.21 1.26* 1.09  CALCIUM 9.3 9.6 8.8*  MG 1.6* 1.9  --    GFR: Estimated Creatinine Clearance: 58.1 mL/min (by C-G formula based on SCr of 1.09 mg/dL). Liver Function Tests: Recent Labs  Lab 04/28/19 0825 05/03/19 1030 05/03/19 1622  AST 17 16 16   ALT 19 17 19   ALKPHOS 62 65 59  BILITOT 0.4 0.6 1.0  PROT 6.7 6.9 6.8  ALBUMIN 3.4* 3.1* 3.4*   Recent Labs  Lab 05/03/19 1622  LIPASE 17   No results for input(s): AMMONIA in the last 168 hours. Coagulation Profile: No results for input(s): INR, PROTIME in the last 168 hours. Cardiac Enzymes: No results for input(s): CKTOTAL, CKMB, CKMBINDEX, TROPONINI in the last 168 hours. BNP (last 3 results) No results for input(s): PROBNP in the last 8760 hours. HbA1C: No results for input(s): HGBA1C in the last 72 hours. CBG: No results for input(s): GLUCAP in the last 168 hours. Lipid Profile: No results for input(s): CHOL, HDL, LDLCALC, TRIG, CHOLHDL, LDLDIRECT in the last 72 hours. Thyroid Function Tests: No results for input(s): TSH, T4TOTAL, FREET4, T3FREE, THYROIDAB in the last 72 hours. Anemia Panel: No results for input(s): VITAMINB12, FOLATE, FERRITIN, TIBC, IRON, RETICCTPCT in the last 72 hours. Urine analysis: No results found for: COLORURINE, APPEARANCEUR, LABSPEC, PHURINE, GLUCOSEU, HGBUR, BILIRUBINUR, KETONESUR, PROTEINUR, UROBILINOGEN,  NITRITE, LEUKOCYTESUR Sepsis Labs: @LABRCNTIP (procalcitonin:4,lacticidven:4) )No results found for this or any previous visit (from the past 240 hour(s)).   Radiological Exams on Admission: US Abdomen Limited  Result Date: 05/03/2019 CLINICAL DATA:  Right upper quadrant abdominal pain. Known cholangiocarcinoma. EXAM: ULTRASOUND ABDOMEN LIMITED RIGHT UPPER QUADRANT COMPARISON:  MRI abdomen 01/20/2019 FINDINGS: Gallbladder: Gallbladder is distended and contains a large  amount of echogenic sludge. The gallbladder wall is slightly thickened measuring a maximum of 5 mm. No pericholecystic fluid. No sonographic Murphy sign. Common bile duct: Diameter: Difficult to identify and measure. No obvious dilatation. Patient has a plastic common bile duct stent in place. Liver: No intrahepatic biliary dilatation. No worrisome hepatic lesions. No perihepatic fluid collections. Portal vein is patent on color Doppler imaging with normal direction of blood flow towards the liver. Other: Slightly complex upper pole right renal cyst is noted. IMPRESSION: 1. Mildly distended gallbladder with large amount of echogenic sludge. There is also moderate gallbladder wall thickening. Could not exclude a calculus cholecystitis. 2. Difficult to identify and measure the common bile duct. Patient has a known plastic common bile duct stent in place and there is no obvious biliary dilatation. 3. No worrisome hepatic lesions. Electronically Signed   By: Marijo Sanes M.D.   On: 05/03/2019 13:28    Assessment/Plan Principal Problem:   Acute cholecystitis Active Problems:   Diabetes mellitus type 2, noninsulin dependent ([place])   Essential hypertension   Dyslipidemia   Cholangiocarcinoma ([place])   Thrombocytopenia ([facility])   Anemia     #1 acute cholecystitis: Patient will be admitted for inpatient care.  Pain management.  Initiate IV antibiotics.  Supportive care to continue.  Keep n.p.o.  Follow surgical recommendation  #2 diabetes:  Blood sugar appears uncontrolled.  Sliding scale insulin.  #3 essential hypertension: Patient will be n.p.o. for now.  Maintain blood pressure control with IV medications until cleared by surgery.  #4 cholangiocarcinoma: Continue per oncology.  #5 anemia of chronic disease: Monitor H&H.  #6 thrombocytopenia: Most likely due to chemotherapy.  #7 hyperlipidemia: Patient will be n.p.o.  Resume home regimen when stable.   DVT prophylaxis: Lovenox Code Status: Full code Family Communication: Discussed care with the patient Disposition Plan: Home Consults called: General surgery Admission status: Inpatient  Severity of Illness: The appropriate patient status for this patient is INPATIENT. Inpatient status is judged to be reasonable and necessary in order to provide the required intensity of service to ensure the patient's safety. The patient's presenting symptoms, physical exam findings, and initial radiographic and laboratory data in the context of their chronic comorbidities is felt to place them at high risk for further clinical deterioration. Furthermore, it is not anticipated that the patient will be medically stable for discharge from the hospital within 2 midnights of admission. The following factors support the patient status of inpatient.   " The patient's presenting symptoms include abdominal pain. " The worrisome physical exam findings include tenderness in the right upper quadrant. " The initial radiographic and laboratory data are worrisome because of ultrasound showing cholecystitis. " The chronic co-morbidities include cholangiocarcinoma.   * I certify that at the point of admission it is my clinical judgment that the patient will require inpatient hospital care spanning beyond 2 midnights from the point of admission due to high intensity of service, high risk for further deterioration and high frequency of surveillance required.Barbette Merino MD [facility] Pager  [phone]  If 7PM-7AM, please contact night-coverage [email or web address] Password [facility]  05/03/2019, 6:25 PM

## 2019-05-03 NOTE — ED Provider Notes (Addendum)
[facility] DEPT Provider Note   CSN: [id number] Arrival date & time: 05/03/19  1512   History   Chief Complaint Chief Complaint  Patient presents with   Abdominal Pain     HPI Christopher Welch is a 73 y.o. male with PMH of cholangiocarcinoma, hypertension, type 2 diabetes who presents with 3-day duration of right upper quadrant abdominal pain.  Pain described as a pulling pain that is present with movement and deep breaths and absent when lying still.  Patient reports taking 5 mg oxycodone x2 without relief.  Patient diagnosed with cholangiocarcinoma in April of this year.  By chart review, MR demonstrated concern for tumor in the biliary tract at the confluence of the common hepatic, cystic, and bile ducts.  Per patient, surgery was attempted but due to proximity of vessels surgical excision was aborted and neoadjuvant chemotherapy initiated.  Patient to finish last cycle of chemotherapy this month and will be reevaluated for surgical operability.  Of note, patient has had an ERCP performed with placement of a stent in the common bile duct.      The history is provided by the patient.    Past Medical History:  Diagnosis Date   Diabetes mellitus type I, controlled ([place])    Dyslipidemia    ED (erectile dysfunction)    Essential hypertension    Gilbert's disease    Hepatitis B carrier [facility])     Patient Active Problem List   Diagnosis Date Noted   Port-A-Cath in place 03/19/2019   Cholangiocarcinoma ([place]) 03/08/2019   Goals of care, counseling/discussion 03/08/2019   Abnormal cardiac CT angiography 05/19/2017   Atypical angina ([place]) 04/23/2017   Chest pain with moderate risk for cardiac etiology 04/17/2017   Essential hypertension    Dyslipidemia    Insomnia 07/28/2015   Hypoglycemia 04/14/2015   Diabetic retinopathy, nonproliferative, mild ([place]) 12/05/2011   Diabetes mellitus type 2, noninsulin dependent ([place]) 01/12/2008    Hyperlipidemia with target low density lipoprotein (LDL) cholesterol less than 70 mg/dL 01/12/2008   HYPERTENSION 01/12/2008    Past Surgical History:  Procedure Laterality Date   APPENDECTOMY  1964   BILIARY BRUSHING  01/21/2019   Procedure: BILIARY BRUSHING;  Surgeon: Carol Ada, MD;  Location: [facility];  Service: Endoscopy;;   BILIARY STENT PLACEMENT N/A 01/21/2019   Procedure: BILIARY STENT PLACEMENT;  Surgeon: Carol Ada, MD;  Location: [facility];  Service: Endoscopy;  Laterality: N/A;   ENDOSCOPIC RETROGRADE CHOLANGIOPANCREATOGRAPHY (ERCP) WITH PROPOFOL N/A 01/21/2019   Procedure: ENDOSCOPIC RETROGRADE CHOLANGIOPANCREATOGRAPHY (ERCP) WITH PROPOFOL;  Surgeon: Carol Ada, MD;  Location: [facility];  Service: Endoscopy;  Laterality: N/A;   ESOPHAGOGASTRODUODENOSCOPY (EGD) WITH PROPOFOL N/A 01/21/2019   Procedure: ESOPHAGOGASTRODUODENOSCOPY (EGD) WITH PROPOFOL;  Surgeon: Carol Ada, MD;  Location: [facility];  Service: Endoscopy;  Laterality: N/A;   FEMORAL HERNIA REPAIR     IR IMAGING GUIDED PORT INSERTION  03/08/2019   LEFT HEART CATH AND CORONARY ANGIOGRAPHY N/A 05/19/2017   Procedure: LEFT HEART CATH AND CORONARY ANGIOGRAPHY;  Surgeon: Leonie Man, MD;  Location: [facility];  Service: Cardiovascular;  Laterality: N/A;   SPHINCTEROTOMY  01/21/2019   Procedure: SPHINCTEROTOMY;  Surgeon: Carol Ada, MD;  Location: [facility];  Service: Endoscopy;;   UPPER ESOPHAGEAL ENDOSCOPIC ULTRASOUND (EUS) N/A 01/21/2019   Procedure: UPPER ESOPHAGEAL ENDOSCOPIC ULTRASOUND (EUS);  Surgeon: Carol Ada, MD;  Location: [facility];  Service: Endoscopy;  Laterality: N/A;        Home Medications  Prior to Admission medications   Medication Sig Start Date End Date Taking? Authorizing Provider  amLODipine (NORVASC) 5 MG tablet Take 5 mg by mouth daily.     [provider]  atenolol (TENORMIN) 25 MG tablet Take by mouth daily. Once a day     [provider]  atorvastatin (LIPITOR) 10 MG tablet Take 10 mg by mouth daily. 05/13/17   [provider]  Blood Glucose Monitoring Suppl (ACCU-CHEK NANO SMARTVIEW) W/DEVICE KIT 1 kit by Does not apply route 2 (two) times daily. Patient taking differently: 1 kit by Does not apply route See admin instructions. Test blood sugars 12x's daily 10/23/12   Denita Lung, MD  diphenhydrAMINE (BENADRYL) 50 MG capsule Take by mouth.    [provider]  DiphenhydrAMINE HCl (ZZZQUIL) 50 MG/30ML LIQD Take 60 mLs by mouth at bedtime.    [provider]  glucose blood test strip Test 3 times a day. Patient taking differently: 1 each by Other route See admin instructions. Test 12 times a day. 10/26/12   Denita Lung, MD  insulin NPH Human (NOVOLIN N) 100 UNIT/ML injection Inject 10 Units into the skin 2 (two) times a day.    [provider]  insulin regular (NOVOLIN R) 100 units/mL injection Inject 10 Units into the skin 3 (three) times daily before meals.    [provider]  lidocaine-prilocaine (EMLA) cream Apply to affected area once 03/08/19   [name], MD  lisinopril (ZESTRIL) 20 MG tablet Take 20 mg by mouth daily.  03/14/17   [provider]  magnesium oxide (MAG-OX) 400 (241.3 Mg) MG tablet Take 1 tablet (400 mg total) by mouth daily. 03/12/19   [name], MD  metFORMIN (GLUCOPHAGE) 1000 MG tablet Take 2,000 mg by mouth every evening.     [provider]  Niacin CR (SLO-NIACIN) 750 MG TBCR Take 1,500 mg by mouth at bedtime.    [provider]  ondansetron (ZOFRAN) 8 MG tablet TAKE 1 TABLET BY MOUTH 2 TIMES DAY AS NEEDED. START ON 3RD DAY AFTER CHEMOTHERAPY 04/19/19   [name], MD  oxycodone (OXY-IR) 5 MG capsule Take 1 capsule (5 mg total) by mouth every 6 (six) hours as needed for pain. 04/08/19   Alla Feeling, NP  prochlorperazine (COMPAZINE) 10 MG tablet TAKE 1 TABLET (10 MG TOTAL) BY MOUTH EVERY 6 (SIX) HOURS AS NEEDED  (NAUSEA OR VOMITING). 04/19/19   [name], MD  zolpidem (AMBIEN) 5 MG tablet Take 1-2 tablets (5-10 mg total) by mouth at bedtime as needed for sleep. 04/01/19   [name], MD  citalopram (CELEXA) 20 MG tablet Take 1 tablet (20 mg total) by mouth daily. 02/26/11 12/05/11  Denita Lung, MD  clonazePAM (KLONOPIN) 0.5 MG tablet Take 0.5 mg by mouth 2 (two) times daily as needed.    12/05/11  [provider]  lamoTRIgine (LAMICTAL) 100 MG tablet Take 100 mg by mouth daily. 1/2 TABLET QD   12/05/11  [provider]    Family History Family History  Problem Relation Age of Onset   Diabetes Father     Social History Social History   Tobacco Use   Smoking status: Never Smoker   Smokeless tobacco: Never Used  Substance Use Topics   Alcohol use: Yes    Alcohol/week: 7.0 standard drinks    Types: 7 Glasses of wine per week   Drug use: No     Allergies   Erythromycin  Review of Systems Review of Systems  Constitutional: Negative for chills and fever.  Respiratory: Negative for cough and shortness of breath.   Cardiovascular: Negative for chest pain.  Gastrointestinal: Positive for abdominal pain. Negative for diarrhea, nausea and vomiting.  Genitourinary: Negative for difficulty urinating and dysuria.  Musculoskeletal: Negative for back pain, myalgias and neck pain.  All other systems reviewed and are negative.    Physical Exam Updated Vital Signs BP (!) 144/79 (BP Location: Right Arm)    Pulse 94    Temp 99.1 F (37.3 C) (Oral)    Resp 18    Ht 5' 7"  (1.702 m)    Wt 66.3 kg    SpO2 98%    BMI 22.90 kg/m   Physical Exam Vitals signs and nursing note reviewed.  Constitutional:      Appearance: He is normal weight.  HENT:     Head: Normocephalic and atraumatic.  Cardiovascular:     Rate and Rhythm: Normal rate and regular rhythm.  Pulmonary:     Effort: No respiratory distress.     Breath sounds: Normal breath sounds. No wheezing, rhonchi or rales.    Abdominal:     General: Abdomen is flat. Bowel sounds are normal.     Palpations: There is no hepatomegaly, splenomegaly or mass.     Tenderness: There is abdominal tenderness in the right upper quadrant.  Skin:    General: Skin is warm and dry.     Findings: No erythema or rash.  Neurological:     Mental Status: He is alert.  Psychiatric:        Mood and Affect: Mood normal.        Behavior: Behavior normal.      ED Treatments / Results  Labs (all labs ordered are listed, but only abnormal results are displayed) Labs Reviewed  CBC WITH DIFFERENTIAL/PLATELET - Abnormal; Notable for the following components:      Result Value   RBC 2.90 (*)    Hemoglobin 8.8 (*)    HCT 26.9 (*)    Platelets 122 (*)    All other components within normal limits  CULTURE, BLOOD (ROUTINE X 2)  CULTURE, BLOOD (ROUTINE X 2)  COMPREHENSIVE METABOLIC PANEL  LIPASE, BLOOD  LACTIC ACID, PLASMA  LACTIC ACID, PLASMA    EKG None  Radiology US Abdomen Limited  Result Date: 05/03/2019 CLINICAL DATA:  Right upper quadrant abdominal pain. Known cholangiocarcinoma. EXAM: ULTRASOUND ABDOMEN LIMITED RIGHT UPPER QUADRANT COMPARISON:  MRI abdomen 01/20/2019 FINDINGS: Gallbladder: Gallbladder is distended and contains a large amount of echogenic sludge. The gallbladder wall is slightly thickened measuring a maximum of 5 mm. No pericholecystic fluid. No sonographic Murphy sign. Common bile duct: Diameter: Difficult to identify and measure. No obvious dilatation. Patient has a plastic common bile duct stent in place. Liver: No intrahepatic biliary dilatation. No worrisome hepatic lesions. No perihepatic fluid collections. Portal vein is patent on color Doppler imaging with normal direction of blood flow towards the liver. Other: Slightly complex upper pole right renal cyst is noted. IMPRESSION: 1. Mildly distended gallbladder with large amount of echogenic sludge. There is also moderate gallbladder wall thickening.  Could not exclude a calculus cholecystitis. 2. Difficult to identify and measure the common bile duct. Patient has a known plastic common bile duct stent in place and there is no obvious biliary dilatation. 3. No worrisome hepatic lesions. Electronically Signed   By: Marijo Sanes M.D.   On: 05/03/2019 13:28    Procedures  Procedures (including critical care time)  Medications Ordered in ED Medications  sodium chloride 0.9 % bolus 1,000 mL (has no administration in time range)  morphine 4 MG/ML injection 4 mg (has no administration in time range)     Initial Impression / Assessment and Plan / ED Course  I have reviewed the triage vital signs and the nursing notes.  Pertinent labs & imaging results that were available during my care of the patient were reviewed by me and considered in my medical decision making (see chart for details).        Christopher Welch is a 73 year old male with past medical history of cholangiocarcinoma who presents with a 3-day history of right upper quadrant pain.  Right upper quadrant ultrasound reveals gallbladder sludge and mild wall thickening.  Patient is afebrile, hemodynamically stable, WBC of 6.1, lipase of 17.  LFTs and bilirubin WNL lowering concern for CBD obstruction. Clinical presentation and studies consistent with biliary colic vs. early cholecystitis. Case discussed with surgery and patient is not a good operative candidate.  * Zosyn and meropenem and fluid given * Admit for pain control, IV antibiotics, and further management    Final Clinical Impressions(s) / ED Diagnoses   Final diagnoses:  None    ED Discharge Orders    None       Jeanmarie Hubert, MD 05/03/19 1722    Jeanmarie Hubert, MD 05/03/19 2303    Drenda Freeze, MD 05/04/19 [phone]

## 2019-05-03 NOTE — ED Triage Notes (Signed)
Pt states that he is having excruciating RUQ pain. Pt had US done, which showed sludge and enlargement. Pt has hx gallbladder CA.

## 2019-05-03 NOTE — Patient Instructions (Signed)

## 2019-05-04 ENCOUNTER — Inpatient Hospital Stay (HOSPITAL_COMMUNITY): Payer: Medicare Other

## 2019-05-04 ENCOUNTER — Encounter (HOSPITAL_COMMUNITY): Payer: Self-pay

## 2019-05-04 DIAGNOSIS — C221 Intrahepatic bile duct carcinoma: Secondary | ICD-10-CM

## 2019-05-04 DIAGNOSIS — I10 Essential (primary) hypertension: Secondary | ICD-10-CM

## 2019-05-04 DIAGNOSIS — D61818 Other pancytopenia: Secondary | ICD-10-CM

## 2019-05-04 DIAGNOSIS — D649 Anemia, unspecified: Secondary | ICD-10-CM

## 2019-05-04 DIAGNOSIS — E119 Type 2 diabetes mellitus without complications: Secondary | ICD-10-CM

## 2019-05-04 HISTORY — PX: IR PERC CHOLECYSTOSTOMY: IMG2326

## 2019-05-04 LAB — COMPREHENSIVE METABOLIC PANEL
ALT: 20 U/L (ref 0–44)
AST: 19 U/L (ref 15–41)
Albumin: 2.8 g/dL — ABNORMAL LOW (ref 3.5–5.0)
Alkaline Phosphatase: 50 U/L (ref 38–126)
Anion gap: 8 (ref 5–15)
BUN: 26 mg/dL — ABNORMAL HIGH (ref 8–23)
CO2: 23 mmol/L (ref 22–32)
Calcium: 8 mg/dL — ABNORMAL LOW (ref 8.9–10.3)
Chloride: 102 mmol/L (ref 98–111)
Creatinine, Ser: 1.07 mg/dL (ref 0.61–1.24)
GFR calc Af Amer: >60 mL/min (ref >60)
GFR calc non Af Amer: >60 mL/min (ref >60)
Glucose, Bld: 218 mg/dL — ABNORMAL HIGH (ref 70–99)
Potassium: 3.8 mmol/L (ref 3.5–5.1)
Sodium: 133 mmol/L — ABNORMAL LOW (ref 135–145)
Total Bilirubin: 0.9 mg/dL (ref 0.3–1.2)
Total Protein: 5.9 g/dL — ABNORMAL LOW (ref 6.5–8.1)

## 2019-05-04 LAB — CBC
HCT: 23.5 % — ABNORMAL LOW (ref 39.0–52.0)
HCT: 23.2 % — ABNORMAL LOW (ref 39.0–52.0)
Hemoglobin: 7.8 g/dL — ABNORMAL LOW (ref 13.0–17.0)
Hemoglobin: 7.7 g/dL — ABNORMAL LOW (ref 13.0–17.0)
MCH: 31.1 pg (ref 26.0–34.0)
MCH: 31.2 pg (ref 26.0–34.0)
MCHC: 33.2 g/dL (ref 30.0–36.0)
MCHC: 33.2 g/dL (ref 30.0–36.0)
MCV: 93.6 fL (ref 80.0–100.0)
MCV: 93.9 fL (ref 80.0–100.0)
Platelets: 86 10*3/uL — ABNORMAL LOW (ref 150–400)
Platelets: 75 10*3/uL — ABNORMAL LOW (ref 150–400)
RBC: 2.51 MIL/uL — ABNORMAL LOW (ref 4.22–5.81)
RBC: 2.47 MIL/uL — ABNORMAL LOW (ref 4.22–5.81)
RDW: 15 % (ref 11.5–15.5)
RDW: 14.7 % (ref 11.5–15.5)
WBC: 3.9 10*3/uL — ABNORMAL LOW (ref 4.0–10.5)
WBC: 3.7 10*3/uL — ABNORMAL LOW (ref 4.0–10.5)
nRBC: 0 % (ref 0.0–0.2)
nRBC: 0 % (ref 0.0–0.2)

## 2019-05-04 LAB — CBG MONITORING, ED
Glucose-Capillary: 190 mg/dL — ABNORMAL HIGH (ref 70–99)
Glucose-Capillary: 65 mg/dL — ABNORMAL LOW (ref 70–99)
Glucose-Capillary: 93 mg/dL (ref 70–99)

## 2019-05-04 LAB — HEMOGLOBIN A1C
Hgb A1c MFr Bld: 7.7 % — ABNORMAL HIGH (ref 4.8–5.6)
Mean Plasma Glucose: 174.29 mg/dL

## 2019-05-04 LAB — GLUCOSE, CAPILLARY
Glucose-Capillary: 126 mg/dL — ABNORMAL HIGH (ref 70–99)
Glucose-Capillary: 159 mg/dL — ABNORMAL HIGH (ref 70–99)

## 2019-05-04 LAB — CREATININE, SERUM
Creatinine, Ser: 0.93 mg/dL (ref 0.61–1.24)
GFR calc Af Amer: >60 mL/min (ref >60)
GFR calc non Af Amer: >60 mL/min (ref >60)

## 2019-05-04 LAB — CANCER ANTIGEN 19-9: CA 19-9: 49 U/mL — ABNORMAL HIGH (ref 0–35)

## 2019-05-04 MED ORDER — PIPERACILLIN-TAZOBACTAM 3.375 G IVPB
3.3750 g | Freq: Three times a day (TID) | INTRAVENOUS | Status: DC
  Administered 2019-05-04 – 2019-05-07 (×10): 3.375 g via INTRAVENOUS
  Filled 2019-05-04 (×12): qty 50

## 2019-05-04 MED ORDER — FENTANYL CITRATE (PF) 100 MCG/2ML IJ SOLN
INTRAMUSCULAR | Status: AC
  Filled 2019-05-04: qty 2

## 2019-05-04 MED ORDER — LIDOCAINE HCL 1 % IJ SOLN
INTRAMUSCULAR | Status: AC
  Filled 2019-05-04: qty 20

## 2019-05-04 MED ORDER — TECHNETIUM TC 99M MEBROFENIN IV KIT
5.3000 | PACK | Freq: Once | INTRAVENOUS | Status: AC | PRN
  Administered 2019-05-04: 5.3 via INTRAVENOUS

## 2019-05-04 MED ORDER — SODIUM CHLORIDE 0.9 % IV SOLN
INTRAVENOUS | Status: DC | PRN
  Administered 2019-05-04: 500 mL via INTRAVENOUS

## 2019-05-04 MED ORDER — HYDROMORPHONE HCL 1 MG/ML IJ SOLN
0.5000 mg | INTRAMUSCULAR | Status: DC | PRN
  Administered 2019-05-04 – 2019-05-07 (×9): 0.5 mg via INTRAVENOUS
  Filled 2019-05-04 (×10): qty 0.5

## 2019-05-04 MED ORDER — LABETALOL HCL 5 MG/ML IV SOLN
10.0000 mg | INTRAVENOUS | Status: DC | PRN
  Filled 2019-05-04: qty 4

## 2019-05-04 MED ORDER — MORPHINE BOLUS VIA INFUSION
2.7000 mg | Freq: Once | INTRAVENOUS | Status: DC
  Filled 2019-05-04: qty 3

## 2019-05-04 MED ORDER — MORPHINE SULFATE (PF) 4 MG/ML IV SOLN: 3.0000 mg | INTRAVENOUS | Status: DC | PRN

## 2019-05-04 MED ORDER — MIDAZOLAM HCL 2 MG/2ML IJ SOLN
INTRAMUSCULAR | Status: AC | PRN
  Administered 2019-05-04 (×2): 1 mg via INTRAVENOUS

## 2019-05-04 MED ORDER — LIDOCAINE HCL (PF) 1 % IJ SOLN
INTRAMUSCULAR | Status: AC | PRN
  Administered 2019-05-04: 10 mL

## 2019-05-04 MED ORDER — KETOROLAC TROMETHAMINE 15 MG/ML IJ SOLN: 15.0000 mg | Freq: Three times a day (TID) | INTRAMUSCULAR | Status: AC | PRN

## 2019-05-04 MED ORDER — IOHEXOL 300 MG/ML  SOLN
50.0000 mL | Freq: Once | INTRAMUSCULAR | Status: AC | PRN
  Administered 2019-05-04: 5 mL

## 2019-05-04 MED ORDER — SODIUM CHLORIDE 0.9% FLUSH
5.0000 mL | Freq: Three times a day (TID) | INTRAVENOUS | Status: DC
  Administered 2019-05-04 – 2019-05-07 (×9): 5 mL

## 2019-05-04 MED ORDER — MIDAZOLAM HCL 2 MG/2ML IJ SOLN
INTRAMUSCULAR | Status: AC
  Filled 2019-05-04: qty 2

## 2019-05-04 MED ORDER — MORPHINE SULFATE (PF) 2 MG/ML IV SOLN
INTRAVENOUS | Status: AC
  Filled 2019-05-04: qty 2

## 2019-05-04 MED ORDER — FENTANYL CITRATE (PF) 100 MCG/2ML IJ SOLN
INTRAMUSCULAR | Status: AC | PRN
  Administered 2019-05-04: 50 ug via INTRAVENOUS

## 2019-05-04 NOTE — Progress Notes (Signed)
ED TO INPATIENT HANDOFF REPORT  ED Nurse Name and Phone #: Irven Baltimore [phone]  S Name/Age/Gender Christopher Welch 73 y.o. male Room/Bed: WA11/WA11  Code Status   Code Status: Full Code  Home/SNF/Other Home Patient oriented to: self, place, time and situation Is this baseline? Yes   Triage Complete: Triage complete  Chief Complaint cancer pt  Triage Note Pt states that he is having excruciating RUQ pain. Pt had US done, which showed sludge and enlargement. Pt has hx gallbladder CA.     Allergies Allergies  Allergen Reactions  . Erythromycin     Severe Abdominal pain    Level of Care/Admitting Diagnosis ED Disposition    ED Disposition Condition Comment   Admit  Hospital Area: [facility] [[id number]]  Level of Care: Med-Surg [16]  Covid Evaluation: Asymptomatic Screening Protocol (No Symptoms)  Diagnosis: Acute cholecystitis [575.0.ICD-9-CM]  Admitting Physician: Elwyn Reach [[id number]]  Attending Physician: Elwyn Reach [[id number]]  Estimated length of stay: past midnight tomorrow  Certification:: I certify this patient will need inpatient services for at least 2 midnights  PT Class (Do Not Modify): Inpatient [101]  PT Acc Code (Do Not Modify): Private [1]       B Medical/Surgery History Past Medical History:  Diagnosis Date  . Diabetes mellitus type I, controlled ([place])   . Dyslipidemia   . ED (erectile dysfunction)   . Essential hypertension   . Gilbert's disease   . Hepatitis B carrier [place])    Past Surgical History:  Procedure Laterality Date  . APPENDECTOMY  1964  . BILIARY BRUSHING  01/21/2019   Procedure: BILIARY BRUSHING;  Surgeon: Carol Ada, MD;  Location: [facility];  Service: Endoscopy;;  . BILIARY STENT PLACEMENT N/A 01/21/2019   Procedure: BILIARY STENT PLACEMENT;  Surgeon: Carol Ada, MD;  Location[facility];  Service: Endoscopy;  Laterality: N/A;  . ENDOSCOPIC RETROGRADE CHOLANGIOPANCREATOGRAPHY (ERCP) WITH  PROPOFOL N/A 01/21/2019   Procedure: ENDOSCOPIC RETROGRADE CHOLANGIOPANCREATOGRAPHY (ERCP) WITH PROPOFOL;  Surgeon: Carol Ada, MD;  [facility];  Service: Endoscopy;  Laterality: N/A;  . ESOPHAGOGASTRODUODENOSCOPY (EGD) WITH PROPOFOL N/A 01/21/2019   Procedure: ESOPHAGOGASTRODUODENOSCOPY (EGD) WITH PROPOFOL;  Surgeon: Carol Ada, MD;  [facility];  Service: Endoscopy;  Laterality: N/A;  . FEMORAL HERNIA REPAIR    . IR IMAGING GUIDED PORT INSERTION  03/08/2019  . LEFT HEART CATH AND CORONARY ANGIOGRAPHY N/A 05/19/2017   Procedure: LEFT HEART CATH AND CORONARY ANGIOGRAPHY;  Surgeon: Leonie Man, MD;  Location: [facility];  Service: Cardiovascular;  Laterality: N/A;  . SPHINCTEROTOMY  01/21/2019   Procedure: SPHINCTEROTOMY;  Surgeon: Carol Ada, MD;  [facility];  Service: Endoscopy;;  . UPPER ESOPHAGEAL ENDOSCOPIC ULTRASOUND (EUS) N/A 01/21/2019   Procedure: UPPER ESOPHAGEAL ENDOSCOPIC ULTRASOUND (EUS);  Surgeon: Carol Ada, MD;  Location: [facility];  Service: Endoscopy;  Laterality: N/A;     A IV Location/Drains/Wounds Patient Lines/Drains/Airways Status   Active Line/Drains/Airways    Name:   Placement date:   Placement time:   Site:   Days:   Implanted Port Right Chest   -    -    Chest      GI Stent 8.5 Fr.   01/21/19    1045    -   103          Intake/Output Last 24 hours  Intake/Output Summary (Last 24 hours) at 05/04/2019 1430 Last data filed at 05/04/2019 1400 Gross per 24 hour  Intake 2976.59  ml  Output 400 ml  Net 2576.59 ml    Labs/Imaging Results for orders placed or performed during the hospital encounter of 05/03/19 (from the past 48 hour(s))  Lactic acid, plasma     Status: None   Collection Time: 05/03/19  4:17 PM  Result Value Ref Range   Lactic Acid, Venous 1.2 0.5 - 1.9 mmol/L    Comment: Performed at [facility], [place] [address], [place], [place] [address]  CBC with Differential/Platelet      Status: Abnormal   Collection Time: 05/03/19  4:22 PM  Result Value Ref Range   WBC 6.1 4.0 - 10.5 K/uL   RBC 2.90 (L) 4.22 - 5.81 MIL/uL   Hemoglobin 8.8 (L) 13.0 - 17.0 g/dL   HCT 26.9 (L) 39.0 - 52.0 %   MCV 92.8 80.0 - 100.0 fL   MCH 30.3 26.0 - 34.0 pg   MCHC 32.7 30.0 - 36.0 g/dL   RDW 14.9 11.5 - 15.5 %   Platelets 122 (L) 150 - 400 K/uL   nRBC 0.0 0.0 - 0.2 %   Neutrophils Relative % 73 %   Lymphocytes Relative 11 %   Monocytes Relative 2 %   Eosinophils Relative 1 %   Basophils Relative 1 %   Band Neutrophils 12 %   Metamyelocytes Relative 0 %   Myelocytes 0 %   Promyelocytes Relative 0 %   Blasts 0 %   nRBC 0 0 /100 WBC   Other 0 %   Neutro Abs 5.1 1.7 - 7.7 K/uL   Lymphs Abs 0.7 0.7 - 4.0 K/uL   Monocytes Absolute 0.1 0.1 - 1.0 K/uL   Eosinophils Absolute 0.1 0.0 - 0.5 K/uL   Basophils Absolute 0.1 0.0 - 0.1 K/uL   RBC Morphology ELLIPTOCYTES     Comment: TEARDROP CELLS BURR CELLS    WBC Morphology TOXIC GRANULATION     Comment: DOHLE BODIES VACUOLATED NEUTROPHILS Performed at [facility] [address] [place], [place] [address]   Comprehensive metabolic panel     Status: Abnormal   Collection Time: 05/03/19  4:22 PM  Result Value Ref Range   Sodium 133 (L) 135 - 145 mmol/L   Potassium 4.2 3.5 - 5.1 mmol/L   Chloride 99 98 - 111 mmol/L   CO2 24 22 - 32 mmol/L   Glucose, Bld 253 (H) 70 - 99 mg/dL   BUN 30 (H) 8 - 23 mg/dL   Creatinine, Ser 1.09 0.61 - 1.24 mg/dL   Calcium 8.8 (L) 8.9 - 10.3 mg/dL   Total Protein 6.8 6.5 - 8.1 g/dL   Albumin 3.4 (L) 3.5 - 5.0 g/dL   AST 16 15 - 41 U/L   ALT 19 0 - 44 U/L   Alkaline Phosphatase 59 38 - 126 U/L   Total Bilirubin 1.0 0.3 - 1.2 mg/dL   GFR calc non Af Amer >60 >60 mL/min   GFR calc Af Amer >60 >60 mL/min   Anion gap 10 5 - 15    Comment: Performed at [facility], [place] [address], [place], [place] [address]  Lipase, blood     Status: None   Collection Time:  05/03/19  4:22 PM  Result Value Ref Range   Lipase 17 11 - 51 U/L    Comment: Performed at [facility], [place] [address], [place], [place] [address]  Hemoglobin A1c     Status: Abnormal   Collection Time: 05/03/19  4:22 PM  Result Value Ref Range   Hgb A1c MFr Bld 7.7 (H) 4.8 - 5.6 %    Comment: (NOTE) Pre diabetes:          5.7%-6.4% Diabetes:              >6.4% Glycemic control for   <7.0% adults with diabetes    Mean Plasma Glucose 174.29 mg/dL    Comment: Performed at [facility] [address], [place], [place] [address]  Blood culture (routine x 2)     Status: None (Preliminary result)   Collection Time: 05/03/19  5:17 PM   Specimen: BLOOD  Result Value Ref Range   Specimen Description      BLOOD RIGHT ANTECUBITAL Performed at [facility], [place] [address], [place], [place] [address]    Special Requests      BOTTLES DRAWN AEROBIC AND ANAEROBIC Blood Culture results may not be optimal due to an excessive volume of blood received in culture bottles Performed at [facility] [address] [place], [place] [address]    Culture      NO GROWTH < 24 HOURS Performed at [facility] [address], [place], [place] [address]    Report Status PENDING   Blood culture (routine x 2)     Status: None (Preliminary result)   Collection Time: 05/03/19  5:17 PM   Specimen: BLOOD  Result Value Ref Range   Specimen Description      BLOOD LEFT ANTECUBITAL Performed at [facility], [place] [address] [place], [place] [address]    Special Requests      BOTTLES DRAWN AEROBIC AND ANAEROBIC Blood Culture results may not be optimal due to an excessive volume of blood received in culture bottles Performed at [facility] [address] [place], [place] [address]    Culture      NO GROWTH < 24 HOURS Performed at [facility] [address] [place],  [address]     Report Status PENDING   SARS Coronavirus 2 [facility] order, Performed in [facility] hospital lab) Nasopharyngeal Nasopharyngeal Swab     Status: None   Collection Time: 05/03/19  9:12 PM   Specimen: Nasopharyngeal Swab  Result Value Ref Range   SARS Coronavirus 2 NEGATIVE NEGATIVE    Comment: (NOTE) If result is NEGATIVE SARS-CoV-2 target nucleic acids are NOT DETECTED. The SARS-CoV-2 RNA is generally detectable in upper and lower  respiratory specimens during the acute phase of infection. The lowest  concentration of SARS-CoV-2 viral copies this assay can detect is 250  copies / mL. A negative result does not preclude SARS-CoV-2 infection  and should not be used as the sole basis for treatment or other  patient management decisions.  A negative result may occur with  improper specimen collection / handling, submission of specimen other  than nasopharyngeal swab, presence of viral mutation(s) within the  areas targeted by this assay, and inadequate number of viral copies  (<250 copies / mL). A negative result must be combined with clinical  observations, patient history, and epidemiological information. If result is POSITIVE SARS-CoV-2 target nucleic acids are DETECTED. The SARS-CoV-2 RNA is generally detectable in upper and lower  respiratory specimens dur ing the acute phase of infection.  Positive  results are indicative of active infection with SARS-CoV-2.  Clinical  correlation with patient history and other diagnostic information is  necessary to determine patient  infection status.  Positive results do  not rule out bacterial infection or co-infection with other viruses. If result is PRESUMPTIVE POSTIVE SARS-CoV-2 nucleic acids MAY BE PRESENT.   A presumptive positive result was obtained on the submitted specimen  and confirmed on repeat testing.  While 2019 novel coronavirus  (SARS-CoV-2) nucleic acids may be present in the submitted sample  additional confirmatory testing  may be necessary for epidemiological  and / or clinical management purposes  to differentiate between  SARS-CoV-2 and other Sarbecovirus currently known to infect humans.  If clinically indicated additional testing with an alternate test  methodology [phone]) is advised. The SARS-CoV-2 RNA is generally  detectable in upper and lower respiratory sp ecimens during the acute  phase of infection. The expected result is Negative. Fact Sheet for Patients:  StrictlyIdeas.no Fact Sheet for Healthcare Providers: BankingDealers.co.za This test is not yet approved or cleared by the [place] FDA and has been authorized for detection and/or diagnosis of SARS-CoV-2 by FDA under an Emergency Use Authorization (EUA).  This EUA will remain in effect (meaning this test can be used) for the duration of the COVID-19 declaration under Section 564(b)(1) of the Act, 21 U.S.C. section 360bbb-3(b)(1), unless the authorization is terminated or revoked sooner. Performed at [facility], [place] [address], [place], [place] [address]   CBG monitoring, ED     Status: Abnormal   Collection Time: 05/04/19 12:53 AM  Result Value Ref Range   Glucose-Capillary 190 (H) 70 - 99 mg/dL  Comprehensive metabolic panel     Status: Abnormal   Collection Time: 05/04/19  4:49 AM  Result Value Ref Range   Sodium 133 (L) 135 - 145 mmol/L   Potassium 3.8 3.5 - 5.1 mmol/L   Chloride 102 98 - 111 mmol/L   CO2 23 22 - 32 mmol/L   Glucose, Bld 218 (H) 70 - 99 mg/dL   BUN 26 (H) 8 - 23 mg/dL   Creatinine, Ser 1.07 0.61 - 1.24 mg/dL   Calcium 8.0 (L) 8.9 - 10.3 mg/dL   Total Protein 5.9 (L) 6.5 - 8.1 g/dL   Albumin 2.8 (L) 3.5 - 5.0 g/dL   AST 19 15 - 41 U/L   ALT 20 0 - 44 U/L   Alkaline Phosphatase 50 38 - 126 U/L   Total Bilirubin 0.9 0.3 - 1.2 mg/dL   GFR calc non Af Amer >60 >60 mL/min   GFR calc Af Amer >60 >60 mL/min   Anion gap 8 5 - 15    Comment:  Performed at [facility], [place] [address], [place], [place] [address]  CBC     Status: Abnormal   Collection Time: 05/04/19  4:49 AM  Result Value Ref Range   WBC 3.9 (L) 4.0 - 10.5 K/uL   RBC 2.51 (L) 4.22 - 5.81 MIL/uL   Hemoglobin 7.8 (L) 13.0 - 17.0 g/dL   HCT 23.5 (L) 39.0 - 52.0 %   MCV 93.6 80.0 - 100.0 fL   MCH 31.1 26.0 - 34.0 pg   MCHC 33.2 30.0 - 36.0 g/dL   RDW 15.0 11.5 - 15.5 %   Platelets 86 (L) 150 - 400 K/uL    Comment: REPEATED TO VERIFY PLATELET COUNT CONFIRMED BY SMEAR SPECIMEN CHECKED FOR CLOTS Immature Platelet Fraction may be clinically indicated, consider ordering this additional test [id number]    nRBC 0.0 0.0 - 0.2 %    Comment: Performed at [facility], [place]  [address] [place], [place] [address]  CBG monitoring, ED     Status: None   Collection Time: 05/04/19  7:53 AM  Result Value Ref Range   Glucose-Capillary 93 70 - 99 mg/dL  CBG monitoring, ED     Status: Abnormal   Collection Time: 05/04/19  9:17 AM  Result Value Ref Range   Glucose-Capillary 65 (L) 70 - 99 mg/dL   Nm Hepatobiliary Liver Func  Result Date: 05/04/2019 CLINICAL DATA:  RIGHT upper quadrant pain. Diagnosis cholangiocarcinoma April 2020. EXAM: NUCLEAR MEDICINE HEPATOBILIARY IMAGING TECHNIQUE: Sequential images of the abdomen were obtained out to 60 minutes following intravenous administration of radiopharmaceutical. RADIOPHARMACEUTICALS:  5.3 mCi Tc-41m Choletec IV (1.5 millicurie booster dose of Choletec) COMPARISON:  Ultrasound 05/03/2019, MRI 01/20/2019 FINDINGS: Prompt clearance of radiotracer from the blood pool and homogeneous uptake in liver. Counts are evident in the common bile duct and proximal small bowel by 15 minutes. The gallbladder is not fill over 60 minutes of imaging. 2.7 mg of IV morphine was administered to contract the sphincter of Oddi. The gallbladder fails to fill following administration of morphine. IMPRESSION: Non filling of  the gallbladder suggest cystic duct obstruction. Recommend clinical correlation with acute cholecystitis. These results will be called to the ordering clinician or representative by the Radiologist Assistant, and communication documented in the PACS or zVision Dashboard. Electronically Signed   By: Suzy Bouchard M.D.   On: 05/04/2019 14:16   US Abdomen Limited  Result Date: 05/03/2019 CLINICAL DATA:  Right upper quadrant abdominal pain. Known cholangiocarcinoma. EXAM: ULTRASOUND ABDOMEN LIMITED RIGHT UPPER QUADRANT COMPARISON:  MRI abdomen 01/20/2019 FINDINGS: Gallbladder: Gallbladder is distended and contains a large amount of echogenic sludge. The gallbladder wall is slightly thickened measuring a maximum of 5 mm. No pericholecystic fluid. No sonographic Murphy sign. Common bile duct: Diameter: Difficult to identify and measure. No obvious dilatation. Patient has a plastic common bile duct stent in place. Liver: No intrahepatic biliary dilatation. No worrisome hepatic lesions. No perihepatic fluid collections. Portal vein is patent on color Doppler imaging with normal direction of blood flow towards the liver. Other: Slightly complex upper pole right renal cyst is noted. IMPRESSION: 1. Mildly distended gallbladder with large amount of echogenic sludge. There is also moderate gallbladder wall thickening. Could not exclude a calculus cholecystitis. 2. Difficult to identify and measure the common bile duct. Patient has a known plastic common bile duct stent in place and there is no obvious biliary dilatation. 3. No worrisome hepatic lesions. Electronically Signed   By: Marijo Sanes M.D.   On: 05/03/2019 13:28    Pending Labs Unresulted Labs (From admission, onward)    Start     Ordered   05/05/19 0500  Hemoglobin A1c  Tomorrow morning,   R     05/04/19 1248   Signed and Held  CBC  (enoxaparin (LOVENOX)    CrCl >/= 30 ml/min)  Once,   R    Comments: Baseline for enoxaparin therapy IF NOT ALREADY DRAWN.   Notify MD if PLT < 100 K.    Signed and Held   Signed and Held  Creatinine, serum  (enoxaparin (LOVENOX)    CrCl >/= 30 ml/min)  Once,   R    Comments: Baseline for enoxaparin therapy IF NOT ALREADY DRAWN.    Signed and Held          Vitals/Pain Today's Vitals   05/04/19 1155 05/04/19 1202 05/04/19 1300 05/04/19 1404  BP: 135/73   134/82  Pulse: 81  81 89  Resp: 16     Temp: 99.2 F (37.3 C)     TempSrc: Oral     SpO2: 99%  99% 100%  Weight:      Height:      PainSc:  7       Isolation Precautions No active isolations  Medications Medications  0.9 %  sodium chloride infusion ( Intravenous New Bag/Given 05/04/19 1311)  insulin aspart (novoLOG) injection 0-9 Units (1 Units Subcutaneous Given 05/04/19 1202)  zolpidem (AMBIEN) tablet 5 mg (5 mg Oral Given 05/03/19 2128)  morphine bolus via infusion 2.7 mg ( Intravenous Canceled Entry 05/04/19 1046)  morphine 2 MG/ML injection (has no administration in time range)  piperacillin-tazobactam (ZOSYN) IVPB 3.375 g (3.375 g Intravenous New Bag/Given 05/04/19 1312)  morphine 4 MG/ML injection 3 mg (has no administration in time range)  labetalol (NORMODYNE) injection 10 mg (has no administration in time range)  ketorolac (TORADOL) 15 MG/ML injection 15 mg (has no administration in time range)  0.9 %  sodium chloride infusion (500 mLs Intravenous New Bag/Given 05/04/19 1311)  sodium chloride 0.9 % bolus 1,000 mL (0 mLs Intravenous Stopped 05/03/19 1829)  morphine 4 MG/ML injection 4 mg (4 mg Intravenous Given 05/03/19 1727)  piperacillin-tazobactam (ZOSYN) IVPB 3.375 g ( Intravenous Stopped 05/03/19 1907)  technetium TC 72M mebrofenin (CHOLETEC) injection 5.3 millicurie (5.3 millicuries Intravenous Contrast Given 05/04/19 0825)    Mobility walks Low fall risk   Focused Assessments    R Recommendations: See Admitting Provider Note  Report given to: Maudie Mercury  Additional Notes:

## 2019-05-04 NOTE — Progress Notes (Signed)
Referring Physician(s): Gross,S  Supervising Physician: Daryll Brod  Patient Status:  [facility] - In-pt  Chief Complaint:  Abdominal pain  Subjective: Patient familiar to IR service from Port-A-Cath placement on 03/08/2019.  He has a history of cholangiocarcinoma diagnosed in April of this year, currently on chemo, with prior CBD stent on 01/21/2019.  He was recently admitted to [facility] with right upper quadrant pain and intermittent nausea.  Subsequent imaging revealed mildly distended gallbladder with large amount of echogenic sludge and moderate gallbladder wall thickening.  Nuclear medicine hepatobiliary scan revealed nonfilling of the gallbladder suggesting cystic duct obstruction.  Patient not an ideal candidate for cholecystectomy and request now received from surgery for percutaneous cholecystostomy.  He currently denies fever, headache, chest pain, dyspnea, cough, vomiting or abnormal bleeding.  Additional medical history as below. Past Medical History:  Diagnosis Date  . Diabetes mellitus type I, controlled ([place])   . Dyslipidemia   . ED (erectile dysfunction)   . Essential hypertension   . Gilbert's disease   . Hepatitis B carrier [facility] : [facility])    Past Surgical History:  Procedure Laterality Date  . APPENDECTOMY  1964  . BILIARY BRUSHING  01/21/2019   Procedure: BILIARY BRUSHING;  Surgeon: Carol Ada, MD;  Location: [facility];  Service: Endoscopy;;  . BILIARY STENT PLACEMENT N/A 01/21/2019   Procedure: BILIARY STENT PLACEMENT;  Surgeon: Carol Ada, MD;  Location: [facility];  Service: Endoscopy;  Laterality: N/A;  . ENDOSCOPIC RETROGRADE CHOLANGIOPANCREATOGRAPHY (ERCP) WITH PROPOFOL N/A 01/21/2019   Procedure: ENDOSCOPIC RETROGRADE CHOLANGIOPANCREATOGRAPHY (ERCP) WITH PROPOFOL;  Surgeon: Carol Ada, MD;  Location: [facility];  Service: Endoscopy;  Laterality: N/A;  . ESOPHAGOGASTRODUODENOSCOPY (EGD) WITH PROPOFOL N/A 01/21/2019   Procedure:  ESOPHAGOGASTRODUODENOSCOPY (EGD) WITH PROPOFOL;  Surgeon: Carol Ada, MD;  Location: [facility];  Service: Endoscopy;  Laterality: N/A;  . FEMORAL HERNIA REPAIR    . IR IMAGING GUIDED PORT INSERTION  03/08/2019  . LEFT HEART CATH AND CORONARY ANGIOGRAPHY N/A 05/19/2017   Procedure: LEFT HEART CATH AND CORONARY ANGIOGRAPHY;  Surgeon: Leonie Man, MD;  Location: [facility];  Service: Cardiovascular;  Laterality: N/A;  . SPHINCTEROTOMY  01/21/2019   Procedure: SPHINCTEROTOMY;  Surgeon: Carol Ada, MD;  Location: [facility];  Service: Endoscopy;;  . UPPER ESOPHAGEAL ENDOSCOPIC ULTRASOUND (EUS) N/A 01/21/2019   Procedure: UPPER ESOPHAGEAL ENDOSCOPIC ULTRASOUND (EUS);  Surgeon: Carol Ada, MD;  Location: [facility];  Service: Endoscopy;  Laterality: N/A;      Allergies: Erythromycin  Medications: Prior to Admission medications   Medication Sig Start Date End Date Taking? Authorizing Provider  amLODipine (NORVASC) 5 MG tablet Take 5 mg by mouth daily.    Yes [provider]  atenolol (TENORMIN) 25 MG tablet Take by mouth daily. Once a day   Yes [provider]  atorvastatin (LIPITOR) 10 MG tablet Take 10 mg by mouth daily. 05/13/17  Yes [provider]  insulin NPH Human (NOVOLIN N) 100 UNIT/ML injection Inject 20 Units into the skin 2 (two) times a day.    Yes [provider]  insulin regular (NOVOLIN R) 100 units/mL injection Inject 0-40 Units into the skin 3 (three) times daily before meals. Sliding scale 0-40 units   Yes [provider]  lisinopril (ZESTRIL) 20 MG tablet Take 20 mg by mouth daily.  03/14/17  Yes [provider]  magnesium oxide (MAG-OX) 400 (241.3 Mg) MG tablet Take 1 tablet (400 mg total) by mouth daily. 03/12/19  Yes [name],  MD  metFORMIN (GLUCOPHAGE) 1000 MG tablet Take 2,000 mg by mouth every evening.    Yes [provider]  ondansetron (ZOFRAN) 8 MG tablet TAKE 1 TABLET BY MOUTH 2 TIMES  DAY AS NEEDED. START ON 3RD DAY AFTER CHEMOTHERAPY Patient taking differently: Take 8 mg by mouth 2 (two) times daily as needed. Start on 3rd day after chemotherapy 04/19/19  Yes Feng, Yan, MD  oxycodone (OXY-IR) 5 MG capsule Take 1 capsule (5 mg total) by mouth every 6 (six) hours as needed for pain. 04/08/19  Yes Burton, Lacie K, NP  prochlorperazine (COMPAZINE) 10 MG tablet TAKE 1 TABLET (10 MG TOTAL) BY MOUTH EVERY 6 (SIX) HOURS AS NEEDED (NAUSEA OR VOMITING). Patient taking differently: Take 10 mg by mouth every 6 (six) hours as needed for nausea or vomiting (Nausea or vomiting).  04/19/19  Yes Feng, Yan, MD  zolpidem (AMBIEN) 5 MG tablet Take 1-2 tablets (5-10 mg total) by mouth at bedtime as needed for sleep. 04/01/19  Yes Feng, Yan, MD  Blood Glucose Monitoring Suppl (ACCU-CHEK NANO SMARTVIEW) W/DEVICE KIT 1 kit by Does not apply route 2 (two) times daily. Patient taking differently: 1 kit by Does not apply route See admin instructions. Test blood sugars 12x's daily 10/23/12   Lalonde, John C, MD  glucose blood test strip Test 3 times a day. Patient taking differently: 1 each by Other route See admin instructions. Test 12 times a day. 10/26/12   Lalonde, John C, MD  lidocaine-prilocaine (EMLA) cream Apply to affected area once Patient not taking: Reported on 05/03/2019 03/08/19   Feng, Yan, MD  citalopram (CELEXA) 20 MG tablet Take 1 tablet (20 mg total) by mouth daily. 02/26/11 12/05/11  Lalonde, John C, MD  clonazePAM (KLONOPIN) 0.5 MG tablet Take 0.5 mg by mouth 2 (two) times daily as needed.    12/05/11  [provider]  lamoTRIgine (LAMICTAL) 100 MG tablet Take 100 mg by mouth daily. 1/2 TABLET QD   12/05/11  [provider]     Vital Signs: BP (!) 144/62 (BP Location: Right Arm)   Pulse 81   Temp 100.2 F (37.9 C)   Resp 20   Ht 5' 7" (1.702 m)   Wt 146 lb 3.2 oz (66.3 kg)   SpO2 98%   BMI 22.90 kg/m   Physical Exam awake, alert.  Chest clear to auscultation bilaterally.  Clean, intact right chest wall Port-A-Cath.  Heart with regular rate and rhythm.  Abdomen soft, positive bowel sounds, mild right upper quadrant tenderness to palpation.  No lower extremity edema  Imaging: Nm Hepatobiliary Liver Func  Result Date: 05/04/2019 CLINICAL DATA:  RIGHT upper quadrant pain. Diagnosis cholangiocarcinoma April 2020. EXAM: NUCLEAR MEDICINE HEPATOBILIARY IMAGING TECHNIQUE: Sequential images of the abdomen were obtained out to 60 minutes following intravenous administration of radiopharmaceutical. RADIOPHARMACEUTICALS:  5.3 mCi Tc-99m Choletec IV (1.5 millicurie booster dose of Choletec) COMPARISON:  Ultrasound 05/03/2019, MRI 01/20/2019 FINDINGS: Prompt clearance of radiotracer from the blood pool and homogeneous uptake in liver. Counts are evident in the common bile duct and proximal small bowel by 15 minutes. The gallbladder is not fill over 60 minutes of imaging. 2.7 mg of IV morphine was administered to contract the sphincter of Oddi. The gallbladder fails to fill following administration of morphine. IMPRESSION: Non filling of the gallbladder suggest cystic duct obstruction. Recommend clinical correlation with acute cholecystitis. These results will be called to the ordering clinician or representative by the Radiologist Assistant, and communication documented in   the PACS or zVision Dashboard. Electronically Signed   By: Stewart  Edmunds M.D.   On: 05/04/2019 14:16   Us Abdomen Limited  Result Date: 05/03/2019 CLINICAL DATA:  Right upper quadrant abdominal pain. Known cholangiocarcinoma. EXAM: ULTRASOUND ABDOMEN LIMITED RIGHT UPPER QUADRANT COMPARISON:  MRI abdomen 01/20/2019 FINDINGS: Gallbladder: Gallbladder is distended and contains a large amount of echogenic sludge. The gallbladder wall is slightly thickened measuring a maximum of 5 mm. No pericholecystic fluid. No sonographic Murphy sign. Common bile duct: Diameter: Difficult to identify and measure. No obvious dilatation.  Patient has a plastic common bile duct stent in place. Liver: No intrahepatic biliary dilatation. No worrisome hepatic lesions. No perihepatic fluid collections. Portal vein is patent on color Doppler imaging with normal direction of blood flow towards the liver. Other: Slightly complex upper pole right renal cyst is noted. IMPRESSION: 1. Mildly distended gallbladder with large amount of echogenic sludge. There is also moderate gallbladder wall thickening. Could not exclude a calculus cholecystitis. 2. Difficult to identify and measure the common bile duct. Patient has a known plastic common bile duct stent in place and there is no obvious biliary dilatation. 3. No worrisome hepatic lesions. Electronically Signed   By: P.  Gallerani M.D.   On: 05/03/2019 13:28    Labs:  CBC: Recent Labs    04/28/19 0825 05/03/19 1030 05/03/19 1622 05/04/19 0449  WBC 6.1 7.1 6.1 3.9*  HGB 9.7* 9.7* 8.8* 7.8*  HCT 29.1* 29.2* 26.9* 23.5*  PLT 242 156 122* 86*    COAGS: Recent Labs    03/08/19 1254  INR 1.0    BMP: Recent Labs    04/28/19 0825 05/03/19 1030 05/03/19 1622 05/04/19 0449  NA 138 134* 133* 133*  K 4.4 5.4* 4.2 3.8  CL 105 99 99 102  CO2 23 27 24 23  GLUCOSE 171* 155* 253* 218*  BUN 25* 30* 30* 26*  CALCIUM 9.3 9.6 8.8* 8.0*  CREATININE 1.21 1.26* 1.09 1.07  GFRNONAA 60* 57* >60 >60  GFRAA >60 >60 >60 >60    LIVER FUNCTION TESTS: Recent Labs    04/28/19 0825 05/03/19 1030 05/03/19 1622 05/04/19 0449  BILITOT 0.4 0.6 1.0 0.9  AST 17 16 16 19  ALT 19 17 19 20  ALKPHOS 62 65 59 50  PROT 6.7 6.9 6.8 5.9*  ALBUMIN 3.4* 3.1* 3.4* 2.8*    Assessment and Plan: Pt with history of cholangiocarcinoma diagnosed in April of this year, currently on chemo, with prior CBD stent on 01/21/2019.  He was recently admitted to [facility] with right upper quadrant pain and intermittent nausea.  Subsequent imaging revealed mildly distended gallbladder with large amount of  echogenic sludge and moderate gallbladder wall thickening.  Nuclear medicine hepatobiliary scan revealed nonfilling of the gallbladder suggesting cystic duct obstruction.  Patient not an ideal candidate for cholecystectomy and request now received from surgery for percutaneous cholecystostomy.  Latest imaging studies have been reviewed by Dr. Shick.  Details/risks of procedure, including but not limited to, internal bleeding, infection, injury to adjacent structures discussed with patient with his understanding and consent.  Procedure scheduled for this afternoon.   Electronically Signed: D. Kevin Allred, PA-C 05/04/2019, 3:45 PM   I spent a total of 25 minutes at the the patient's bedside AND on the patient's [facility] floor or unit, greater than 50% of which was counseling/coordinating care for percutaneous cholecystostomy    Patient ID: [facility], male   DOB: 05/03/1946, 73 y.o.   MRN:   [id number]  

## 2019-05-04 NOTE — Progress Notes (Addendum)
[facility]/Trauma Progress Note      Assessment/Plan Diabetes, insulin-dependent for 29 years HTN History of hep B CAD-followed by Dr. Ellyn Hack Anemia: Hemoglobin 8.8 on admission Cholangiocarcinoma- diagnosed in April 2020 via ERCP by Dr. Benson [facility], patient was undergoing Whipple at [facility] but they aborted due to likely metastatic disease in the vasculature, currently undergoing chemo  RUQ abdominal pain likely cholecystitis - HIDA scan + suggesting cholecystitis  FEN: okay for CLD VTE: SCD's, chemical prophylaxis per medicine ID: Zosyn 08/03>> Foley: none Follow up: TBD  DISPO:  Recommend per chole drain in setting of cholangiocarcinoma.    LOS: 1 day    Subjective: CC: Abdominal pain  Pain improved since admission.  He states the IV Dilaudid is helping.  Patient is currently undergoing chemotherapy with an appointment with Dr. Carlis Abbott to discuss possible Whipple this fall pending CT/PET scan results, per patient.  Patient denies any nausea or vomiting since admission.  Objective: Vital signs in last 24 hours: Temp:  [99.1 F (37.3 C)-99.2 F (37.3 C)] 99.2 F (37.3 C) (08/04 1155) Pulse Rate:  [80-97] 81 (08/04 1300) Resp:  [14-24] 16 (08/04 1155) BP: (111-144)/(58-81) 135/73 (08/04 1155) SpO2:  [96 %-100 %] 99 % (08/04 1300) Weight:  [66.3 kg] 66.3 kg (08/03 1517)    Intake/Output from previous day: 08/03 0701 - 08/04 0700 In: 9030 [I.V.:645; IV Piggyback:1150] Out: -  Intake/Output this shift: Total I/O In: 528.8 [I.V.:428.8; IV Piggyback:100] Out: 400 [Urine:400]  PE: Gen:  Alert, NAD, pleasant, cooperative Pulm: Rate and effort normal Abd: Soft, ND, +BS, large midline scar noted that is well-healed, TTP RUQ with guarding, positive Murphy sign, no peritonitis Skin: no rashes noted, warm and dry   Anti-infectives: Anti-infectives (From admission, onward)   Start     Dose/Rate Route Frequency Ordered Stop   05/04/19 1400  piperacillin-tazobactam  (ZOSYN) IVPB 3.375 g     3.375 g 12.5 mL/hr over 240 Minutes Intravenous Every 8 hours 05/04/19 1020     05/03/19 2000  meropenem (MERREM) 1 g in sodium chloride 0.9 % 100 mL IVPB  Status:  Discontinued     1 g 200 mL/hr over 30 Minutes Intravenous Every 8 hours 05/03/19 1826 05/04/19 1018   05/03/19 1800  piperacillin-tazobactam (ZOSYN) IVPB 3.375 g     3.375 g 100 mL/hr over 30 Minutes Intravenous  Once 05/03/19 1753 05/03/19 1907      Lab Results:  Recent Labs    05/03/19 1622 05/04/19 0449  WBC 6.1 3.9*  HGB 8.8* 7.8*  HCT 26.9* 23.5*  PLT 122* 86*   BMET Recent Labs    05/03/19 1622 05/04/19 0449  NA 133* 133*  K 4.2 3.8  CL 99 102  CO2 24 23  GLUCOSE 253* 218*  BUN 30* 26*  CREATININE 1.09 1.07  CALCIUM 8.8* 8.0*   PT/INR No results for input(s): LABPROT, INR in the last 72 hours. CMP     Component Value Date/Time   NA 133 (L) 05/04/2019 0449   NA 141 05/14/2017 0809   K 3.8 05/04/2019 0449   CL 102 05/04/2019 0449   CO2 23 05/04/2019 0449   GLUCOSE 218 (H) 05/04/2019 0449   BUN 26 (H) 05/04/2019 0449   BUN 11 05/14/2017 0809   CREATININE 1.07 05/04/2019 0449   CREATININE 1.26 (H) 05/03/2019 1030   CREATININE 1.16 12/05/2011 1117   CALCIUM 8.0 (L) 05/04/2019 0449   PROT 5.9 (L) 05/04/2019 0449   ALBUMIN 2.8 (L) 05/04/2019 0449  AST 19 05/04/2019 0449   AST 16 05/03/2019 1030   ALT 20 05/04/2019 0449   ALT 17 05/03/2019 1030   ALKPHOS 50 05/04/2019 0449   BILITOT 0.9 05/04/2019 0449   BILITOT 0.6 05/03/2019 1030   GFRNONAA >60 05/04/2019 0449   GFRNONAA 57 (L) 05/03/2019 1030   GFRAA >60 05/04/2019 0449   GFRAA >60 05/03/2019 1030   Lipase     Component Value Date/Time   LIPASE 17 05/03/2019 1622    Studies/Results: US Abdomen Limited  Result Date: 05/03/2019 CLINICAL DATA:  Right upper quadrant abdominal pain. Known cholangiocarcinoma. EXAM: ULTRASOUND ABDOMEN LIMITED RIGHT UPPER QUADRANT COMPARISON:  MRI abdomen 01/20/2019 FINDINGS:  Gallbladder: Gallbladder is distended and contains a large amount of echogenic sludge. The gallbladder wall is slightly thickened measuring a maximum of 5 mm. No pericholecystic fluid. No sonographic Murphy sign. Common bile duct: Diameter: Difficult to identify and measure. No obvious dilatation. Patient has a plastic common bile duct stent in place. Liver: No intrahepatic biliary dilatation. No worrisome hepatic lesions. No perihepatic fluid collections. Portal vein is patent on color Doppler imaging with normal direction of blood flow towards the liver. Other: Slightly complex upper pole right renal cyst is noted. IMPRESSION: 1. Mildly distended gallbladder with large amount of echogenic sludge. There is also moderate gallbladder wall thickening. Could not exclude a calculus cholecystitis. 2. Difficult to identify and measure the common bile duct. Patient has a known plastic common bile duct stent in place and there is no obvious biliary dilatation. 3. No worrisome hepatic lesions. Electronically Signed   By: Marijo Sanes M.D.   On: 05/03/2019 13:28      Kalman Drape , [facility] 05/04/2019, 1:53 PM  Pager: [phone] Mon-Wed, Friday 7:00am-4:30pm Thurs 7am-11:30am  Consults: [phone]

## 2019-05-04 NOTE — ED Notes (Signed)
Patient's CBG taken with our monitor and was 126, patients own monitor reading 156.

## 2019-05-04 NOTE — Progress Notes (Signed)
PROGRESS NOTE  Christopher Welch GXQ:[id number] DOB: 1946/06/20   PCP: [name], DO  Patient is from: Home  DOA: 05/03/2019 LOS: 1  Brief Narrative / Interim history: 73 year old male with history of IDDM-2, HTN, HLD, cholangiocarcinoma currently on chemo presented to Oncology office with severe RUQ pain.RUQ ultrasound revealed mildly distended gallbladder with large amount of echogenic sludge and moderate gallbladder wall thickening suspicious for cholecystitis, and admitted on 05/03/2019 for further care. He is status post 3 cycles of chemo, and currently on cisplatin, gemcitabine and Abraxane with [name] support which was started the day of admission.  In ED, mildly elevated temp.  WBC 6.1.  Hgb 8.8.  Platelet 122.  Started on IV meropenem.  General surgery consulted and recommended HIDA scan.  Subjective: No major events overnight of this morning.  Abdominal pain improved significantly.  He rates his pain 2/10 now.  Denies nausea or vomiting.  Denies fever or chills.  No GU symptoms.  Patient declines fingerstick for CBG monitoring.  He said he has his own CBG monitoring. However, he is willing to have one CBG check to ensure his monitoring and finger stick CBG reading are consistent.  Objective: Vitals:   05/04/19 0046 05/04/19 0400 05/04/19 1014 05/04/19 1155  BP: 118/63 136/64 121/66 135/73  Pulse: 88 92 80 81  Resp: 19 18 16 16   Temp: 99.2 F (37.3 C)   99.2 F (37.3 C)  TempSrc: Oral   Oral  SpO2: 96% 99% 96% 99%  Weight:      Height:        Intake/Output Summary (Last 24 hours) at 05/04/2019 1251 Last data filed at 05/04/2019 1153 Gross per 24 hour  Intake 2323.8 ml  Output 400 ml  Net 1923.8 ml   Filed Weights   05/03/19 1517  Weight: 66.3 kg    Examination:  GENERAL: No acute distress.  Appears well.  HEENT: MMM.  Vision and hearing grossly intact.  NECK: Supple.  No apparent JVD.  RESP:  No IWOB. Good air movement bilaterally. CVS:  RRR. Heart sounds normal.   ABD/GI/GU: Bowel sounds present. Soft.  Mild RUQ tenderness. MSK/EXT:  Moves extremities. No apparent deformity or edema.  SKIN: no apparent skin lesion or wound NEURO: Awake, alert and oriented appropriately.  No gross deficit.  PSYCH: Calm. Normal affect.   Port a cath over right chest.  I have personally reviewed the following labs and images:  Radiology Studies: US Abdomen Limited  Result Date: 05/03/2019 CLINICAL DATA:  Right upper quadrant abdominal pain. Known cholangiocarcinoma. EXAM: ULTRASOUND ABDOMEN LIMITED RIGHT UPPER QUADRANT COMPARISON:  MRI abdomen 01/20/2019 FINDINGS: Gallbladder: Gallbladder is distended and contains a large amount of echogenic sludge. The gallbladder wall is slightly thickened measuring a maximum of 5 mm. No pericholecystic fluid. No sonographic Murphy sign. Common bile duct: Diameter: Difficult to identify and measure. No obvious dilatation. Patient has a plastic common bile duct stent in place. Liver: No intrahepatic biliary dilatation. No worrisome hepatic lesions. No perihepatic fluid collections. Portal vein is patent on color Doppler imaging with normal direction of blood flow towards the liver. Other: Slightly complex upper pole right renal cyst is noted. IMPRESSION: 1. Mildly distended gallbladder with large amount of echogenic sludge. There is also moderate gallbladder wall thickening. Could not exclude a calculus cholecystitis. 2. Difficult to identify and measure the common bile duct. Patient has a known plastic common bile duct stent in place and there is no obvious biliary dilatation. 3. No worrisome hepatic lesions.  Electronically Signed   By: Marijo Sanes M.D.   On: 05/03/2019 13:28    Microbiology: Recent Results (from the past 240 hour(s))  Blood culture (routine x 2)     Status: None (Preliminary result)   Collection Time: 05/03/19  5:17 PM   Specimen: BLOOD  Result Value Ref Range Status   Specimen Description   Final    BLOOD RIGHT  ANTECUBITAL Performed at [facility] [address], [place], [place] [address]    Special Requests   Final    BOTTLES DRAWN AEROBIC AND ANAEROBIC Blood Culture results may not be optimal due to an excessive volume of blood received in culture bottles Performed at [facility] [address], [place], [place] [address]    Culture   Final    NO GROWTH < 24 HOURS Performed at [facility] [address], [place], [place] [address]    Report Status PENDING  Incomplete  Blood culture (routine x 2)     Status: None (Preliminary result)   Collection Time: 05/03/19  5:17 PM   Specimen: BLOOD  Result Value Ref Range Status   Specimen Description   Final    BLOOD LEFT ANTECUBITAL Performed at [facility] [address], [place], [place] [address]    Special Requests   Final    BOTTLES DRAWN AEROBIC AND ANAEROBIC Blood Culture results may not be optimal due to an excessive volume of blood received in culture bottles Performed at [facility] [address], [place], [place] [address]    Culture   Final    NO GROWTH < 24 HOURS Performed at [facility] [address], [place],  [address]    Report Status PENDING  Incomplete  SARS Coronavirus 2 [facility] order, Performed in [facility] hospital lab) Nasopharyngeal Nasopharyngeal Swab     Status: None   Collection Time: 05/03/19  9:12 PM   Specimen: Nasopharyngeal Swab  Result Value Ref Range Status   SARS Coronavirus 2 NEGATIVE NEGATIVE Final    Comment: (NOTE) If result is NEGATIVE SARS-CoV-2 target nucleic acids are NOT DETECTED. The SARS-CoV-2 RNA is generally detectable in upper and lower  respiratory specimens during the acute phase of infection. The lowest  concentration of SARS-CoV-2 viral copies this assay can detect is 250  copies / mL. A negative result does not preclude SARS-CoV-2 infection  and should not be used as the sole  basis for treatment or other  patient management decisions.  A negative result may occur with  improper specimen collection / handling, submission of specimen other  than nasopharyngeal swab, presence of viral mutation(s) within the  areas targeted by this assay, and inadequate number of viral copies  (<250 copies / mL). A negative result must be combined with clinical  observations, patient history, and epidemiological information. If result is POSITIVE SARS-CoV-2 target nucleic acids are DETECTED. The SARS-CoV-2 RNA is generally detectable in upper and lower  respiratory specimens dur ing the acute phase of infection.  Positive  results are indicative of active infection with SARS-CoV-2.  Clinical  correlation with patient history and other diagnostic information is  necessary to determine patient infection status.  Positive results do  not rule out bacterial infection or co-infection with other viruses. If result is PRESUMPTIVE POSTIVE SARS-CoV-2 nucleic acids MAY BE PRESENT.   A presumptive positive result was obtained on the submitted specimen  and confirmed on repeat testing.  While 2019 novel coronavirus  (SARS-CoV-2) nucleic acids may be present in the submitted sample  additional confirmatory testing may be necessary for epidemiological  and / or clinical management purposes  to differentiate between  SARS-CoV-2 and other Sarbecovirus currently known to infect humans.  If clinically indicated additional testing with an alternate test  methodology [phone]) is advised. The SARS-CoV-2 RNA is generally  detectable in upper and lower respiratory sp ecimens during the acute  phase of infection. The expected result is Negative. Fact Sheet for Patients:  StrictlyIdeas.no Fact Sheet for Healthcare Providers: BankingDealers.co.za This test is not yet approved or cleared by the [place] FDA and has been authorized for detection  and/or diagnosis of SARS-CoV-2 by FDA under an Emergency Use Authorization (EUA).  This EUA will remain in effect (meaning this test can be used) for the duration of the COVID-19 declaration under Section 564(b)(1) of the Act, 21 U.S.C. section 360bbb-3(b)(1), unless the authorization is terminated or revoked sooner. Performed at [facility], [place] [address], [place], [place] [address]     Sepsis Labs: Invalid input(s): PROCALCITONIN, LACTICIDVEN  Urine analysis: No results found for: COLORURINE, APPEARANCEUR, LABSPEC, PHURINE, GLUCOSEU, HGBUR, BILIRUBINUR, KETONESUR, PROTEINUR, UROBILINOGEN, NITRITE, LEUKOCYTESUR  Anemia Panel: No results for input(s): VITAMINB12, FOLATE, FERRITIN, TIBC, IRON, RETICCTPCT in the last 72 hours.  Thyroid Function Tests: No results for input(s): TSH, T4TOTAL, FREET4, T3FREE, THYROIDAB in the last 72 hours.  Lipid Profile: No results for input(s): CHOL, HDL, LDLCALC, TRIG, CHOLHDL, LDLDIRECT in the last 72 hours.  CBG: Recent Labs  Lab 05/04/19 0053 05/04/19 0753 05/04/19 0917  GLUCAP 190* 93 65*    HbA1C: Recent Labs    05/03/19 1622  HGBA1C 7.7*    BNP (last 3 results): No results for input(s): PROBNP in the last 8760 hours.  Cardiac Enzymes: No results for input(s): CKTOTAL, CKMB, CKMBINDEX, TROPONINI in the last 168 hours.  Coagulation Profile: No results for input(s): INR, PROTIME in the last 168 hours.  Liver Function Tests: Recent Labs  Lab 04/28/19 0825 05/03/19 1030 05/03/19 1622 05/04/19 0449  AST 17 16 16 19   ALT 19 17 19 20   ALKPHOS 62 65 59 50  BILITOT 0.4 0.6 1.0 0.9  PROT 6.7 6.9 6.8 5.9*  ALBUMIN 3.4* 3.1* 3.4* 2.8*   Recent Labs  Lab 05/03/19 1622  LIPASE 17   No results for input(s): AMMONIA in the last 168 hours.  Basic Metabolic Panel: Recent Labs  Lab 04/28/19 0825 05/03/19 1030 05/03/19 1622 05/04/19 0449  NA 138 134* 133* 133*  K 4.4 5.4* 4.2 3.8  CL 105 99 99 102   CO2 23 27 24 23   GLUCOSE 171* 155* 253* 218*  BUN 25* 30* 30* 26*  CREATININE 1.21 1.26* 1.09 1.07  CALCIUM 9.3 9.6 8.8* 8.0*  MG 1.6* 1.9  --   --    GFR: Estimated Creatinine Clearance: 59.2 mL/min (by C-G formula based on SCr of 1.07 mg/dL).  CBC: Recent Labs  Lab 04/28/19 0825 05/03/19 1030 05/03/19 1622 05/04/19 0449  WBC 6.1 7.1 6.1 3.9*  NEUTROABS 3.7 5.8 5.1  --   HGB 9.7* 9.7* 8.8* 7.8*  HCT 29.1* 29.2* 26.9* 23.5*  MCV 91.2 92.1 92.8 93.6  PLT 242 156 122* 86*    Procedures:  None  Microbiology summarized: [id number] negative.  Assessment & Plan: RUQ pain/acute cholecystitis:  -RUQ US revealed mildly distended GB with large amount of echogenic sludge and moderate GB wall thickening suspicious for cholecystitis -  HIDA scan pending. -General surgery following. -Change meropenem to Zosyn.  No history of ESBL. -Blood cultures negative so far. -Remains n.p.o. pending HIDA scan results. -Continue IV fluid.  Well-controlled IDDM-2: reportedly recent A1c 6.1%.  -Patient has own CBG monitoring and refuses finger stick. However, he agrees to one finger stick once to compare to his CBG monitoring reading. -SSI-thin based on own CBG reading while NPO -Check A1c  Essential hypertension: Normotensive -PRN labetalol. -Hold home amlodipine and lisinopril while npo  Cholangiocarcinoma -Will FYI his oncologist  Pancytopenia: Likely dilutional due to IV fluid.  However, patient has history of anemia of chronic disease likely due to chemotherapy.  Baseline hemoglobin 9-11. -Continue monitoring  DVT prophylaxis: SCD Code Status: Full code Family Communication: Patient and/or RN. Available if any question. Disposition Plan: Remains inpatient Consultants: General surgery   Antimicrobials: Anti-infectives (From admission, onward)   Start     Dose/Rate Route Frequency Ordered Stop   05/04/19 1400  piperacillin-tazobactam (ZOSYN) IVPB 3.375 g     3.375 g 12.5 mL/hr  over 240 Minutes Intravenous Every 8 hours 05/04/19 1020     05/03/19 2000  meropenem (MERREM) 1 g in sodium chloride 0.9 % 100 mL IVPB  Status:  Discontinued     1 g 200 mL/hr over 30 Minutes Intravenous Every 8 hours 05/03/19 1826 05/04/19 1018   05/03/19 1800  piperacillin-tazobactam (ZOSYN) IVPB 3.375 g     3.375 g 100 mL/hr over 30 Minutes Intravenous  Once 05/03/19 1753 05/03/19 1907      Sch Meds:  Scheduled Meds: . insulin aspart  0-9 Units Subcutaneous TID WC  . morphine      . morphine  2.7 mg Intravenous Once   Continuous Infusions: . sodium chloride 100 mL/hr at 05/04/19 1156  . piperacillin-tazobactam (ZOSYN)  IV     PRN Meds:.labetalol, morphine injection, zolpidem   Jemima Petko T. [place]  If 7PM-7AM, please contact night-coverage [email or web address] Password [email or web address] 05/04/2019, 12:51 PM

## 2019-05-04 NOTE — Procedures (Signed)
Cholecystitis  S/p perc cholecystostomy  No comp Stable ebl min 65 cc bile aspirated cx sent

## 2019-05-05 DIAGNOSIS — C221 Intrahepatic bile duct carcinoma: Secondary | ICD-10-CM | POA: Diagnosis not present

## 2019-05-05 DIAGNOSIS — K810 Acute cholecystitis: Principal | ICD-10-CM

## 2019-05-05 LAB — GLUCOSE, CAPILLARY
Glucose-Capillary: 179 mg/dL — ABNORMAL HIGH (ref 70–99)
Glucose-Capillary: 144 mg/dL — ABNORMAL HIGH (ref 70–99)
Glucose-Capillary: 270 mg/dL — ABNORMAL HIGH (ref 70–99)
Glucose-Capillary: 236 mg/dL — ABNORMAL HIGH (ref 70–99)

## 2019-05-05 LAB — CBC
HCT: 22.7 % — ABNORMAL LOW (ref 39.0–52.0)
Hemoglobin: 7.6 g/dL — ABNORMAL LOW (ref 13.0–17.0)
MCH: 31 pg (ref 26.0–34.0)
MCHC: 33.5 g/dL (ref 30.0–36.0)
MCV: 92.7 fL (ref 80.0–100.0)
Platelets: 76 10*3/uL — ABNORMAL LOW (ref 150–400)
RBC: 2.45 MIL/uL — ABNORMAL LOW (ref 4.22–5.81)
RDW: 14.7 % (ref 11.5–15.5)
WBC: 4.5 10*3/uL (ref 4.0–10.5)
nRBC: 0 % (ref 0.0–0.2)

## 2019-05-05 LAB — HEMOGLOBIN A1C
Hgb A1c MFr Bld: 7.5 % — ABNORMAL HIGH (ref 4.8–5.6)
Mean Plasma Glucose: 168.55 mg/dL

## 2019-05-05 LAB — COMPREHENSIVE METABOLIC PANEL
ALT: 26 U/L (ref 0–44)
AST: 24 U/L (ref 15–41)
Albumin: 2.3 g/dL — ABNORMAL LOW (ref 3.5–5.0)
Alkaline Phosphatase: 46 U/L (ref 38–126)
Anion gap: 6 (ref 5–15)
BUN: 17 mg/dL (ref 8–23)
CO2: 24 mmol/L (ref 22–32)
Calcium: 7.7 mg/dL — ABNORMAL LOW (ref 8.9–10.3)
Chloride: 105 mmol/L (ref 98–111)
Creatinine, Ser: 0.96 mg/dL (ref 0.61–1.24)
GFR calc Af Amer: >60 mL/min (ref >60)
GFR calc non Af Amer: >60 mL/min (ref >60)
Glucose, Bld: 180 mg/dL — ABNORMAL HIGH (ref 70–99)
Potassium: 3.7 mmol/L (ref 3.5–5.1)
Sodium: 135 mmol/L (ref 135–145)
Total Bilirubin: 0.6 mg/dL (ref 0.3–1.2)
Total Protein: 5.4 g/dL — ABNORMAL LOW (ref 6.5–8.1)

## 2019-05-05 LAB — MAGNESIUM: Magnesium: 1.8 mg/dL (ref 1.7–2.4)

## 2019-05-05 MED ORDER — ONDANSETRON HCL 4 MG/2ML IJ SOLN
4.0000 mg | Freq: Once | INTRAMUSCULAR | Status: AC
  Administered 2019-05-05: 05:00:00 4 mg via INTRAVENOUS
  Filled 2019-05-05: qty 2

## 2019-05-05 MED ORDER — INSULIN ASPART 100 UNIT/ML ~~LOC~~ SOLN
0.0000 [IU] | Freq: Three times a day (TID) | SUBCUTANEOUS | Status: DC
  Administered 2019-05-06 (×2): 5 [IU] via SUBCUTANEOUS
  Administered 2019-05-06: 2 [IU] via SUBCUTANEOUS
  Administered 2019-05-07 (×2): 3 [IU] via SUBCUTANEOUS

## 2019-05-05 MED ORDER — SODIUM CHLORIDE 0.9% FLUSH: 10.0000 mL | INTRAVENOUS | Status: DC | PRN

## 2019-05-05 MED ORDER — INSULIN ASPART 100 UNIT/ML ~~LOC~~ SOLN
0.0000 [IU] | Freq: Every day | SUBCUTANEOUS | Status: DC
  Administered 2019-05-05 – 2019-05-06 (×2): 2 [IU] via SUBCUTANEOUS

## 2019-05-05 NOTE — Progress Notes (Signed)
[facility]/Trauma Progress Note      Assessment/Plan Diabetes, insulin-dependent for 29 years HTN History of hep B CAD-followed by Dr. Ellyn Hack Anemia: Hemoglobin 8.8 on admission Cholangiocarcinoma- diagnosed in April 2020 via ERCP by Dr. Benson Norway, patient was undergoing Whipple at [facility] but they aborted due to likely metastatic disease in the vasculature, currently undergoing chemo and has follow up at [facility] at the end of this month with Dr. Carlis Abbott for possible surgical planning for this fall?  RUQ abdominal pain likely cholecystitis - HIDA scan + suggesting cholecystitis - S/P IR per chole drain, 08/04  FEN: okay to advance diet as tolerated VTE: SCD's, chemical prophylaxis per medicine ID: Zosyn 08/03>>  Recommend 10 days total of antibiotics to treat cholecystitis, okay for PO Augmentin at discharge but continue IV while inpatient  Foley: none Follow up: TBD  DISPO:  continue perc chole drain and antibiotics   LOS: 2 days    Subjective: CC: no complaints  Pt is feeling better today and less pain. No issues overnight.   Objective: Vital signs in last 24 hours: Temp:  [98.1 F (36.7 C)-100.2 F (37.9 C)] 98.1 F (36.7 C) (08/05 0456) Pulse Rate:  [67-89] 72 (08/05 0456) Resp:  [16-23] 17 (08/05 0456) BP: (121-144)/(62-84) 132/70 (08/05 0456) SpO2:  [94 %-100 %] 100 % (08/05 0456)    Intake/Output from previous day: 08/04 0701 - 08/05 0700 In: 1514.4 [I.V.:1368.4; IV Piggyback:145.9] Out: 500 [Urine:400; Drains:100] Intake/Output this shift: Total I/O In: 1784.2 [I.V.:1685; IV Piggyback:99.2] Out: -   PE: Gen:  Alert, NAD, pleasant, cooperative Pulm: Rate and effort normal Abd: Soft, ND, mild TTP RUQ without guarding, no peritonitis Skin: no rashes noted, warm and dry  Anti-infectives: Anti-infectives (From admission, onward)   Start     Dose/Rate Route Frequency Ordered Stop   05/04/19 1400  piperacillin-tazobactam (ZOSYN) IVPB 3.375  g     3.375 g 12.5 mL/hr over 240 Minutes Intravenous Every 8 hours 05/04/19 1020     05/03/19 2000  meropenem (MERREM) 1 g in sodium chloride 0.9 % 100 mL IVPB  Status:  Discontinued     1 g 200 mL/hr over 30 Minutes Intravenous Every 8 hours 05/03/19 1826 05/04/19 1018   05/03/19 1800  piperacillin-tazobactam (ZOSYN) IVPB 3.375 g     3.375 g 100 mL/hr over 30 Minutes Intravenous  Once 05/03/19 1753 05/03/19 1907      Lab Results:  Recent Labs    05/04/19 1530 05/05/19 0949  WBC 3.7* 4.5  HGB 7.7* 7.6*  HCT 23.2* 22.7*  PLT 75* 76*   BMET Recent Labs    05/03/19 1622 05/04/19 0449 05/04/19 1530  NA 133* 133*  --   K 4.2 3.8  --   CL 99 102  --   CO2 24 23  --   GLUCOSE 253* 218*  --   BUN 30* 26*  --   CREATININE 1.09 1.07 0.93  CALCIUM 8.8* 8.0*  --    PT/INR No results for input(s): LABPROT, INR in the last 72 hours. CMP     Component Value Date/Time   NA 133 (L) 05/04/2019 0449   NA 141 05/14/2017 0809   K 3.8 05/04/2019 0449   CL 102 05/04/2019 0449   CO2 23 05/04/2019 0449   GLUCOSE 218 (H) 05/04/2019 0449   BUN 26 (H) 05/04/2019 0449   BUN 11 05/14/2017 0809   CREATININE 0.93 05/04/2019 1530   CREATININE 1.26 (H) 05/03/2019 1030   CREATININE  1.16 12/05/2011 1117   CALCIUM 8.0 (L) 05/04/2019 0449   PROT 5.9 (L) 05/04/2019 0449   ALBUMIN 2.8 (L) 05/04/2019 0449   AST 19 05/04/2019 0449   AST 16 05/03/2019 1030   ALT 20 05/04/2019 0449   ALT 17 05/03/2019 1030   ALKPHOS 50 05/04/2019 0449   BILITOT 0.9 05/04/2019 0449   BILITOT 0.6 05/03/2019 1030   GFRNONAA >60 05/04/2019 1530   GFRNONAA 57 (L) 05/03/2019 1030   GFRAA >60 05/04/2019 1530   GFRAA >60 05/03/2019 1030   Lipase     Component Value Date/Time   LIPASE 17 05/03/2019 1622    Studies/Results: Nm Hepatobiliary Liver Func  Result Date: 05/04/2019 CLINICAL DATA:  RIGHT upper quadrant pain. Diagnosis cholangiocarcinoma April 2020. EXAM: NUCLEAR MEDICINE HEPATOBILIARY IMAGING  TECHNIQUE: Sequential images of the abdomen were obtained out to 60 minutes following intravenous administration of radiopharmaceutical. RADIOPHARMACEUTICALS:  5.3 mCi Tc-70m Choletec IV (1.5 millicurie booster dose of Choletec) COMPARISON:  Ultrasound 05/03/2019, MRI 01/20/2019 FINDINGS: Prompt clearance of radiotracer from the blood pool and homogeneous uptake in liver. Counts are evident in the common bile duct and proximal small bowel by 15 minutes. The gallbladder is not fill over 60 minutes of imaging. 2.7 mg of IV morphine was administered to contract the sphincter of Oddi. The gallbladder fails to fill following administration of morphine. IMPRESSION: Non filling of the gallbladder suggest cystic duct obstruction. Recommend clinical correlation with acute cholecystitis. These results will be called to the ordering clinician or representative by the Radiologist Assistant, and communication documented in the PACS or zVision Dashboard. Electronically Signed   By: Suzy Bouchard M.D.   On: 05/04/2019 14:16   US Abdomen Limited  Result Date: 05/03/2019 CLINICAL DATA:  Right upper quadrant abdominal pain. Known cholangiocarcinoma. EXAM: ULTRASOUND ABDOMEN LIMITED RIGHT UPPER QUADRANT COMPARISON:  MRI abdomen 01/20/2019 FINDINGS: Gallbladder: Gallbladder is distended and contains a large amount of echogenic sludge. The gallbladder wall is slightly thickened measuring a maximum of 5 mm. No pericholecystic fluid. No sonographic Murphy sign. Common bile duct: Diameter: Difficult to identify and measure. No obvious dilatation. Patient has a plastic common bile duct stent in place. Liver: No intrahepatic biliary dilatation. No worrisome hepatic lesions. No perihepatic fluid collections. Portal vein is patent on color Doppler imaging with normal direction of blood flow towards the liver. Other: Slightly complex upper pole right renal cyst is noted. IMPRESSION: 1. Mildly distended gallbladder with large amount of  echogenic sludge. There is also moderate gallbladder wall thickening. Could not exclude a calculus cholecystitis. 2. Difficult to identify and measure the common bile duct. Patient has a known plastic common bile duct stent in place and there is no obvious biliary dilatation. 3. No worrisome hepatic lesions. Electronically Signed   By: Marijo Sanes M.D.   On: 05/03/2019 13:28   Ir Perc Cholecystostomy  Result Date: 05/04/2019 INDICATION: Cholecystitis, non operative candidate EXAM: ULTRASOUND FLUOROSCOPIC PERCUTANEOUS TRANSHEPATIC CHOLECYSTOSTOMY MEDICATIONS: Patient is currently receiving IV antibiotics as an inpatient ANESTHESIA/SEDATION: Moderate (conscious) sedation was employed during this procedure. A total of Versed 2.0 mg and Fentanyl 50 mcg was administered intravenously. Moderate Sedation Time: 15 minutes. The patient's level of consciousness and vital signs were monitored continuously by radiology nursing throughout the procedure under my direct supervision. FLUOROSCOPY TIME:  Fluoroscopy Time: 0 minutes 36 seconds (7 mGy). COMPLICATIONS: None immediate. PROCEDURE: Informed written consent was obtained from the patient after a thorough discussion of the procedural risks, benefits and alternatives. All questions were addressed.  Maximal Sterile Barrier Technique was utilized including caps, mask, sterile gowns, sterile gloves, sterile drape, hand hygiene and skin antiseptic. A timeout was performed prior to the initiation of the procedure. Previous imaging reviewed. Patient positioned supine. Preliminary ultrasound performed. The distended gallbladder with wall was localized in the right upper quadrant in the mid axillary line through a lower intercostal space. Overlying skin marked. Under sterile conditions and local anesthesia, a 21 gauge needle was advanced percutaneously a transhepatic approach into the gallbladder. Needle position confirmed with ultrasound. Images obtained for documentation. There  was return of dark bile. Sample sent for culture. Guidewire inserted followed by the Accustick dilator set. Amplatz guidewire exchange followed by tract dilatation to insert a 10 [place] drain. Drain catheter position confirmed with fluoroscopy and contrast injection. 65 cc total bile aspirated. Catheter secured with Prolene suture and connected to external gravity drainage bag. Sterile dressing applied. No immediate complication. Patient tolerated the procedure well. IMPRESSION: Successful ultrasound and fluoroscopic percutaneous transhepatic cholecystostomy. Electronically Signed   By: Jerilynn Mages.  Shick M.D.   On: 05/04/2019 17:22      Kalman Drape , [facility] Surgery 05/05/2019, 10:03 AM  Pager: [phone] Mon-Wed, Friday 7:00am-4:30pm Thurs 7am-11:30am  Consults: [phone]

## 2019-05-05 NOTE — Progress Notes (Signed)
PROGRESS NOTE  JOYCE LECKEY JJO:[id number] DOB: 1946-03-12   PCP: [name], DO  Patient is from: Home  DOA: 05/03/2019 LOS: 2  Brief Narrative / Interim history: 73 year old male with history of IDDM-2, HTN, HLD, cholangiocarcinoma currently on chemo presented to Oncology office with severe RUQ pain.RUQ ultrasound revealed mildly distended gallbladder with large amount of echogenic sludge and moderate gallbladder wall thickening suspicious for cholecystitis, and admitted on 05/03/2019 for further care. He is status post 3 cycles of chemo, and currently on cisplatin, gemcitabine and Abraxane with [name] support which was started the day of admission.  In ED, mildly elevated temp.  WBC 6.1.  Hgb 8.8.  Platelet 122.  Started on IV meropenem.  General surgery consulted and recommended HIDA scan which was suspicious for cystic duct obstruction.  Patient had percutaneous cholecystostomy by IR on 05/04/2019.  Surgical tissue cultures growing polymicrobial's.  Speciation pending.  Subjective: Patient had percutaneous cholecystostomy by IR yesterday.  No major events overnight of this morning.  No complaint this morning.  Abdominal pain improved significantly.  Denies chest pain, dyspnea, nausea or vomiting.  Cholecystostomy tube draining.  Objective: Vitals:   05/04/19 1705 05/04/19 2135 05/05/19 0456 05/05/19 1334  BP: 140/74 132/84 132/70 128/71  Pulse: 86 67 72 77  Resp: 17 16 17 12   Temp:  98.4 F (36.9 C) 98.1 F (36.7 C) 98 F (36.7 C)  TempSrc:  Oral Oral Oral  SpO2: 100% 98% 100% 98%  Weight:      Height:        Intake/Output Summary (Last 24 hours) at 05/05/2019 1409 Last data filed at 05/05/2019 1300 Gross per 24 hour  Intake 2236.94 ml  Output 100 ml  Net 2136.94 ml   Filed Weights   05/03/19 1517  Weight: 66.3 kg    Examination:  GENERAL: No acute distress.  Appears well.  HEENT: MMM.  Vision and hearing grossly intact.  NECK: Supple.  No apparent JVD.  RESP:  No  IWOB. Good air movement bilaterally. CVS:  RRR. Heart sounds normal.  ABD/GI/GU: Bowel sounds present. Soft.  Mild tenderness to palpation.  Percutaneous cholecystostomy with brownish drainage. MSK/EXT:  Moves extremities. No apparent deformity or edema.  SKIN: no apparent skin lesion or wound NEURO: Awake, alert and oriented appropriately.  No gross deficit.  PSYCH: Calm. Normal affect.   Port a cath over right chest.  I have personally reviewed the following labs and images:  Radiology Studies: Ir Perc Cholecystostomy  Result Date: 05/04/2019 INDICATION: Cholecystitis, non operative candidate EXAM: ULTRASOUND FLUOROSCOPIC PERCUTANEOUS TRANSHEPATIC CHOLECYSTOSTOMY MEDICATIONS: Patient is currently receiving IV antibiotics as an inpatient ANESTHESIA/SEDATION: Moderate (conscious) sedation was employed during this procedure. A total of Versed 2.0 mg and Fentanyl 50 mcg was administered intravenously. Moderate Sedation Time: 15 minutes. The patient's level of consciousness and vital signs were monitored continuously by radiology nursing throughout the procedure under my direct supervision. FLUOROSCOPY TIME:  Fluoroscopy Time: 0 minutes 36 seconds (7 mGy). COMPLICATIONS: None immediate. PROCEDURE: Informed written consent was obtained from the patient after a thorough discussion of the procedural risks, benefits and alternatives. All questions were addressed. Maximal Sterile Barrier Technique was utilized including caps, mask, sterile gowns, sterile gloves, sterile drape, hand hygiene and skin antiseptic. A timeout was performed prior to the initiation of the procedure. Previous imaging reviewed. Patient positioned supine. Preliminary ultrasound performed. The distended gallbladder with wall was localized in the right upper quadrant in the mid axillary line through a lower intercostal space. Overlying  skin marked. Under sterile conditions and local anesthesia, a 21 gauge needle was advanced percutaneously  a transhepatic approach into the gallbladder. Needle position confirmed with ultrasound. Images obtained for documentation. There was return of dark bile. Sample sent for culture. Guidewire inserted followed by the Accustick dilator set. Amplatz guidewire exchange followed by tract dilatation to insert a 10 [place] drain. Drain catheter position confirmed with fluoroscopy and contrast injection. 65 cc total bile aspirated. Catheter secured with Prolene suture and connected to external gravity drainage bag. Sterile dressing applied. No immediate complication. Patient tolerated the procedure well. IMPRESSION: Successful ultrasound and fluoroscopic percutaneous transhepatic cholecystostomy. Electronically Signed   By: Jerilynn Mages.  Shick M.D.   On: 05/04/2019 17:22    Microbiology: Recent Results (from the past 240 hour(s))  Blood culture (routine x 2)     Status: None (Preliminary result)   Collection Time: 05/03/19  5:17 PM   Specimen: BLOOD  Result Value Ref Range Status   Specimen Description   Final    BLOOD RIGHT ANTECUBITAL Performed at [facility], [place] [address] [place], [place] [address]    Special Requests   Final    BOTTLES DRAWN AEROBIC AND ANAEROBIC Blood Culture results may not be optimal due to an excessive volume of blood received in culture bottles Performed at [facility] [address] [place], [place] [address]    Culture   Final    NO GROWTH 2 DAYS Performed at [facility] [address], [place], [place] [address]    Report Status PENDING  Incomplete  Blood culture (routine x 2)     Status: None (Preliminary result)   Collection Time: 05/03/19  5:17 PM   Specimen: BLOOD  Result Value Ref Range Status   Specimen Description   Final    BLOOD LEFT ANTECUBITAL Performed at [facility] [address] [place], [place] [address]    Special Requests   Final    BOTTLES DRAWN AEROBIC AND ANAEROBIC Blood Culture  results may not be optimal due to an excessive volume of blood received in culture bottles Performed at [place] [address] [place], [place] [address]    Culture   Final    NO GROWTH 2 DAYS Performed at [facility] [address], [place], [place] [address]    Report Status PENDING  Incomplete  SARS Coronavirus 2 [facility] order, Performed in [facility] hospital lab) Nasopharyngeal Nasopharyngeal Swab     Status: None   Collection Time: 05/03/19  9:12 PM   Specimen: Nasopharyngeal Swab  Result Value Ref Range Status   SARS Coronavirus 2 NEGATIVE NEGATIVE Final    Comment: (NOTE) If result is NEGATIVE SARS-CoV-2 target nucleic acids are NOT DETECTED. The SARS-CoV-2 RNA is generally detectable in upper and lower  respiratory specimens during the acute phase of infection. The lowest  concentration of SARS-CoV-2 viral copies this assay can detect is 250  copies / mL. A negative result does not preclude SARS-CoV-2 infection  and should not be used as the sole basis for treatment or other  patient management decisions.  A negative result may occur with  improper specimen collection / handling, submission of specimen other  than nasopharyngeal swab, presence of viral mutation(s) within the  areas targeted by this assay, and inadequate number of viral copies  (<250 copies / mL). A negative result must be combined with clinical  observations, patient history, and epidemiological information. If  result is POSITIVE SARS-CoV-2 target nucleic acids are DETECTED. The SARS-CoV-2 RNA is generally detectable in upper and lower  respiratory specimens dur ing the acute phase of infection.  Positive  results are indicative of active infection with SARS-CoV-2.  Clinical  correlation with patient history and other diagnostic information is  necessary to determine patient infection status.  Positive results do  not rule out bacterial infection or co-infection with  other viruses. If result is PRESUMPTIVE POSTIVE SARS-CoV-2 nucleic acids MAY BE PRESENT.   A presumptive positive result was obtained on the submitted specimen  and confirmed on repeat testing.  While 2019 novel coronavirus  (SARS-CoV-2) nucleic acids may be present in the submitted sample  additional confirmatory testing may be necessary for epidemiological  and / or clinical management purposes  to differentiate between  SARS-CoV-2 and other Sarbecovirus currently known to infect humans.  If clinically indicated additional testing with an alternate test  methodology [phone]) is advised. The SARS-CoV-2 RNA is generally  detectable in upper and lower respiratory sp ecimens during the acute  phase of infection. The expected result is Negative. Fact Sheet for Patients:  StrictlyIdeas.no Fact Sheet for Healthcare Providers: BankingDealers.co.za This test is not yet approved or cleared by the [place] FDA and has been authorized for detection and/or diagnosis of SARS-CoV-2 by FDA under an Emergency Use Authorization (EUA).  This EUA will remain in effect (meaning this test can be used) for the duration of the COVID-19 declaration under Section 564(b)(1) of the Act, 21 U.S.C. section 360bbb-3(b)(1), unless the authorization is terminated or revoked sooner. Performed at [facility], [place] [address] [place], [place] [address]   Aerobic/Anaerobic Culture (surgical/deep wound)     Status: None (Preliminary result)   Collection Time: 05/04/19  5:17 PM   Specimen: Gallbladder; Abscess  Result Value Ref Range Status   Specimen Description   Final    GALL BLADDER Performed at [facility] [address] [place], [place] [address]    Special Requests   Final    Normal Performed at [facility], [place] [address], [place], [place] [address]    Gram Stain   Final    NO WBC SEEN FEW  GRAM NEGATIVE RODS FEW GRAM VARIABLE ROD RARE GRAM POSITIVE COCCI    Culture   Final    ABUNDANT KLEBSIELLA PNEUMONIAE SUSCEPTIBILITIES TO FOLLOW Performed at [facility], [place] [address], [place], [place] [address]    Report Status PENDING  Incomplete    Sepsis Labs: Invalid input(s): PROCALCITONIN, LACTICIDVEN  Urine analysis: No results found for: COLORURINE, APPEARANCEUR, LABSPEC, PHURINE, GLUCOSEU, HGBUR, BILIRUBINUR, KETONESUR, PROTEINUR, UROBILINOGEN, NITRITE, LEUKOCYTESUR  Anemia Panel: No results for input(s): VITAMINB12, FOLATE, FERRITIN, TIBC, IRON, RETICCTPCT in the last 72 hours.  Thyroid Function Tests: No results for input(s): TSH, T4TOTAL, FREET4, T3FREE, THYROIDAB in the last 72 hours.  Lipid Profile: No results for input(s): CHOL, HDL, LDLCALC, TRIG, CHOLHDL, LDLDIRECT in the last 72 hours.  CBG: Recent Labs  Lab 05/04/19 0917 05/04/19 1152 05/04/19 1755 05/05/19 0744 05/05/19 1135  GLUCAP 65* 126* 159* 179* 144*    HbA1C: Recent Labs    05/03/19 1622 05/05/19 0949  HGBA1C 7.7* 7.5*    BNP (last 3 results): No results for input(s): PROBNP in the last 8760 hours.  Cardiac Enzymes: No results for input(s): CKTOTAL, CKMB, CKMBINDEX, TROPONINI in the last 168 hours.  Coagulation Profile: No results for input(s): INR, PROTIME in the last 168  hours.  Liver Function Tests: Recent Labs  Lab 05/03/19 1030 05/03/19 1622 05/04/19 0449 05/05/19 0949  AST 16 16 19 24   ALT 17 19 20 26   ALKPHOS 65 59 50 46  BILITOT 0.6 1.0 0.9 0.6  PROT 6.9 6.8 5.9* 5.4*  ALBUMIN 3.1* 3.4* 2.8* 2.3*   Recent Labs  Lab 05/03/19 1622  LIPASE 17   No results for input(s): AMMONIA in the last 168 hours.  Basic Metabolic Panel: Recent Labs  Lab 05/03/19 1030 05/03/19 1622 05/04/19 0449 05/04/19 1530 05/05/19 0949  NA 134* 133* 133*  --  135  K 5.4* 4.2 3.8  --  3.7  CL 99 99 102  --  105  CO2 27 24 23   --  24  GLUCOSE 155* 253* 218*  --  180*   BUN 30* 30* 26*  --  17  CREATININE 1.26* 1.09 1.07 0.93 0.96  CALCIUM 9.6 8.8* 8.0*  --  7.7*  MG 1.9  --   --   --  1.8   GFR: Estimated Creatinine Clearance: 66 mL/min (by C-G formula based on SCr of 0.96 mg/dL).  CBC: Recent Labs  Lab 05/03/19 1030 05/03/19 1622 05/04/19 0449 05/04/19 1530 05/05/19 0949  WBC 7.1 6.1 3.9* 3.7* 4.5  NEUTROABS 5.8 5.1  --   --   --   HGB 9.7* 8.8* 7.8* 7.7* 7.6*  HCT 29.2* 26.9* 23.5* 23.2* 22.7*  MCV 92.1 92.8 93.6 93.9 92.7  PLT 156 122* 86* 75* 76*    Procedures:  None  Microbiology summarized: [id number] negative.  Assessment & Plan: RUQ pain/acute cholecystitis:  -RUQ [place] and HIDA concerning for cholecystitis with cystic duct blockage. -Deemed to be not a candidate for lap chole due to cholangiocarcinoma. -Percutaneous cholecystostomy by IR on 05/04/2019. -Surgical culture grew polymicrobial-speciation and sensitivity pending. -Meropenem 8/3-8/4 -Zosyn 8/4>>> -Blood cultures negative so far. -Start with full liquid diet and advance to carb modified/heart healthy. -Discontinue IV fluid. -Patient to be discharged with cholecystostomy and follow-up with [facility] second opinion/other intervention when medically ready.  Fairly-controlled IDDM-2: A1c 7.5%.  CBG within fair range. -CBG monitoring and sliding scale -Resume home statin  Essential hypertension: Normotensive -PRN labetalol. -We will resume home amlodipine and lisinopril if needed.  Cholangiocarcinoma -Will FYI his oncologist  Anemia of chronic disease/thrombocytopenia: Baseline Hgb 9-11.  -Hgb dropped from 8.8-7.8 and remained stable. -Thrombocytopenia at baseline. -Continue monitoring  DVT prophylaxis: SCD Code Status: Full code Family Communication: Per patient, has no family. Disposition Plan: Remains inpatient pending surgical culture speciation and sensitivity Consultants: General surgery, IR   Antimicrobials: Anti-infectives (From admission, onward)    Start     Dose/Rate Route Frequency Ordered Stop   05/04/19 1400  piperacillin-tazobactam (ZOSYN) IVPB 3.375 g     3.375 g 12.5 mL/hr over 240 Minutes Intravenous Every 8 hours 05/04/19 1020     05/03/19 2000  meropenem (MERREM) 1 g in sodium chloride 0.9 % 100 mL IVPB  Status:  Discontinued     1 g 200 mL/hr over 30 Minutes Intravenous Every 8 hours 05/03/19 1826 05/04/19 1018   05/03/19 1800  piperacillin-tazobactam (ZOSYN) IVPB 3.375 g     3.375 g 100 mL/hr over 30 Minutes Intravenous  Once 05/03/19 1753 05/03/19 1907      Sch Meds:  Scheduled Meds:  insulin aspart  0-9 Units Subcutaneous TID WC   morphine       morphine  2.7 mg Intravenous Once   sodium chloride flush  5  mL Intracatheter Q8H   Continuous Infusions:  sodium chloride 100 mL/hr at 05/05/19 0902   sodium chloride Stopped (05/05/19 0527)   piperacillin-tazobactam (ZOSYN)  IV 3.375 g (05/05/19 0527)   PRN Meds:.sodium chloride, HYDROmorphone (DILAUDID) injection, ketorolac, labetalol, sodium chloride flush, zolpidem   Yeiden Frenkel T. [place]  If 7PM-7AM, please contact night-coverage [email or web address] Password [email or web address] 05/05/2019, 2:09 PM

## 2019-05-05 NOTE — Progress Notes (Addendum)
Christopher Welch   DOB:Nov 19, 1945   HO#:[id number]   BBC#:[id number]  Oncology follow up   Subjective: Patient is well-known to me, under my care for his extrahepatic cholangiocarcinoma, currently on chemotherapy.  He was admitted from my office for acute cholecystitis.  He had a percutaneous gallbladder drainage tube placed by IR yesterday, his pain much improved.  He was eating dinner when I saw him.   Objective:  Vitals:   05/05/19 1334 05/05/19 2118  BP: 128/71 138/70  Pulse: 77 86  Resp: 12 19  Temp: 98 F (36.7 C) 98.4 F (36.9 C)  SpO2: 98% 98%    Body mass index is 22.9 kg/m.  Intake/Output Summary (Last 24 hours) at 05/05/2019 2147 Last data filed at 05/05/2019 1715 Gross per 24 hour  Intake 2995.49 ml  Output 450 ml  Net 2545.49 ml     Sclerae unicteric  Oropharynx clear  No peripheral adenopathy  Lungs clear -- no rales or rhonchi  Heart regular rate and rhythm  Abdomen soft, PTC tube in right side.   MSK no focal spinal tenderness, no peripheral edema  Neuro nonfocal   CBG (last 3)  Recent Labs    05/05/19 1135 05/05/19 1704 05/05/19 2117  GLUCAP 144* 270* 236*     Labs:  Lab Results  Component Value Date   WBC 4.5 05/05/2019   HGB 7.6 (L) 05/05/2019   HCT 22.7 (L) 05/05/2019   MCV 92.7 05/05/2019   PLT 76 (L) 05/05/2019   NEUTROABS 5.1 05/03/2019    Urine Studies No results for input(s): UHGB, CRYS in the last 72 hours.  Invalid input(s): UACOL, UAPR, USPG, UPH, UTP, UGL, UKET, UBIL, UNIT, UROB, [place], UEPI, UWBC, Duwayne Heck [place], [place]  Basic Metabolic Panel: Recent Labs  Lab 05/03/19 1030 05/03/19 1622 05/04/19 0449 05/04/19 1530 05/05/19 0949  NA 134* 133* 133*  --  135  K 5.4* 4.2 3.8  --  3.7  CL 99 99 102  --  105  CO2 _0 --  24  GLUCOSE 155* 253* 218*  --  180*  BUN 30* 30* 26*  --  17  CREATININE 1.26* 1.09 1.07 0.93 0.96  CALCIUM 9.6 8.8* 8.0*  --  7.7*  MG 1.9  --   --   --  1.8   GFR Estimated Creatinine  Clearance: 66 mL/min (by C-G formula based on SCr of 0.96 mg/dL). Liver Function Tests: Recent Labs  Lab 05/03/19 1030 05/03/19 1622 05/04/19 0449 05/05/19 0949  AST _1 ALT _2 ALKPHOS 65 59 50 46  BILITOT 0.6 1.0 0.9 0.6  PROT 6.9 6.8 5.9* 5.4*  ALBUMIN 3.1* 3.4* 2.8* 2.3*   Recent Labs  Lab 05/03/19 1622  LIPASE 17   No results for input(s): AMMONIA in the last 168 hours. Coagulation profile No results for input(s): INR, PROTIME in the last 168 hours.  CBC: Recent Labs  Lab 05/03/19 1030 05/03/19 1622 05/04/19 0449 05/04/19 1530 05/05/19 0949  WBC 7.1 6.1 3.9* 3.7* 4.5  NEUTROABS 5.8 5.1  --   --   --   HGB 9.7* 8.8* 7.8* 7.7* 7.6*  HCT 29.2* 26.9* 23.5* 23.2* 22.7*  MCV 92.1 92.8 93.6 93.9 92.7  PLT 156 122* 86* 75* 76*   Cardiac Enzymes: No results for input(s): CKTOTAL, CKMB, CKMBINDEX, TROPONINI in the last 168 hours. BNP: Invalid input(s): POCBNP CBG: Recent Labs  Lab 05/04/19 1755 05/05/19 0744 05/05/19 1135  05/05/19 1704 05/05/19 2117  GLUCAP 159* 179* 144* 270* 236*   D-Dimer No results for input(s): DDIMER in the last 72 hours. Hgb A1c Recent Labs    05/03/19 1622 05/05/19 0949  HGBA1C 7.7* 7.5*   Lipid Profile No results for input(s): CHOL, HDL, LDLCALC, TRIG, CHOLHDL, LDLDIRECT in the last 72 hours. Thyroid function studies No results for input(s): TSH, T4TOTAL, T3FREE, THYROIDAB in the last 72 hours.  Invalid input(s): FREET3 Anemia work up No results for input(s): VITAMINB12, FOLATE, FERRITIN, TIBC, IRON, RETICCTPCT in the last 72 hours. Microbiology Recent Results (from the past 240 hour(s))  Blood culture (routine x 2)     Status: None (Preliminary result)   Collection Time: 05/03/19  5:17 PM   Specimen: BLOOD  Result Value Ref Range Status   Specimen Description   Final    BLOOD RIGHT ANTECUBITAL Performed at [facility] [address] [place], [place] [address]    Special  Requests   Final    BOTTLES DRAWN AEROBIC AND ANAEROBIC Blood Culture results may not be optimal due to an excessive volume of blood received in culture bottles Performed at [facility] [address], [place], [place] [address]    Culture   Final    NO GROWTH 2 DAYS Performed at [facility] [address], [place], [place] [address]    Report Status PENDING  Incomplete  Blood culture (routine x 2)     Status: None (Preliminary result)   Collection Time: 05/03/19  5:17 PM   Specimen: BLOOD  Result Value Ref Range Status   Specimen Description   Final    BLOOD LEFT ANTECUBITAL Performed at [facility] [address] [place], [place] [address]    Special Requests   Final    BOTTLES DRAWN AEROBIC AND ANAEROBIC Blood Culture results may not be optimal due to an excessive volume of blood received in culture bottles Performed at [facility] [address], [place], [place] [address]    Culture   Final    NO GROWTH 2 DAYS Performed at [facility] [address], [place], [place] [address]    Report Status PENDING  Incomplete  SARS Coronavirus 2 [facility] order, Performed in [facility] hospital lab) Nasopharyngeal Nasopharyngeal Swab     Status: None   Collection Time: 05/03/19  9:12 PM   Specimen: Nasopharyngeal Swab  Result Value Ref Range Status   SARS Coronavirus 2 NEGATIVE NEGATIVE Final    Comment: (NOTE) If result is NEGATIVE SARS-CoV-2 target nucleic acids are NOT DETECTED. The SARS-CoV-2 RNA is generally detectable in upper and lower  respiratory specimens during the acute phase of infection. The lowest  concentration of SARS-CoV-2 viral copies this assay can detect is 250  copies / mL. A negative result does not preclude SARS-CoV-2 infection  and should not be used as the sole basis for treatment or other  patient management decisions.  A negative result may occur with  improper specimen  collection / handling, submission of specimen other  than nasopharyngeal swab, presence of viral mutation(s) within the  areas targeted by this assay, and inadequate number of viral copies  (<250 copies / mL). A negative result must be combined with clinical  observations, patient history, and epidemiological information. If result is POSITIVE SARS-CoV-2 target nucleic acids are DETECTED. The SARS-CoV-2 RNA is generally detectable in upper and lower  respiratory specimens dur ing the acute phase  of infection.  Positive  results are indicative of active infection with SARS-CoV-2.  Clinical  correlation with patient history and other diagnostic information is  necessary to determine patient infection status.  Positive results do  not rule out bacterial infection or co-infection with other viruses. If result is PRESUMPTIVE POSTIVE SARS-CoV-2 nucleic acids MAY BE PRESENT.   A presumptive positive result was obtained on the submitted specimen  and confirmed on repeat testing.  While 2019 novel coronavirus  (SARS-CoV-2) nucleic acids may be present in the submitted sample  additional confirmatory testing may be necessary for epidemiological  and / or clinical management purposes  to differentiate between  SARS-CoV-2 and other Sarbecovirus currently known to infect humans.  If clinically indicated additional testing with an alternate test  methodology [phone]) is advised. The SARS-CoV-2 RNA is generally  detectable in upper and lower respiratory sp ecimens during the acute  phase of infection. The expected result is Negative. Fact Sheet for Patients:  StrictlyIdeas.no Fact Sheet for Healthcare Providers: BankingDealers.co.za This test is not yet approved or cleared by the [place] FDA and has been authorized for detection and/or diagnosis of SARS-CoV-2 by FDA under an Emergency Use Authorization (EUA).  This EUA will remain in effect  (meaning this test can be used) for the duration of the COVID-19 declaration under Section 564(b)(1) of the Act, 21 U.S.C. section 360bbb-3(b)(1), unless the authorization is terminated or revoked sooner. Performed at [facility], [place] [address] [place], [place] [address]   Aerobic/Anaerobic Culture (surgical/deep wound)     Status: None (Preliminary result)   Collection Time: 05/04/19  5:17 PM   Specimen: Gallbladder; Abscess  Result Value Ref Range Status   Specimen Description   Final    GALL BLADDER Performed at [facility] [address], [place], [place] [address]    Special Requests   Final    Normal Performed at [facility], [place] [address] [place],  [address]    Gram Stain   Final    NO WBC SEEN FEW GRAM NEGATIVE RODS FEW GRAM VARIABLE ROD RARE GRAM POSITIVE COCCI    Culture   Final    ABUNDANT KLEBSIELLA PNEUMONIAE SUSCEPTIBILITIES TO FOLLOW Performed at [facility], [place] [address] [place],  [address]    Report Status PENDING  Incomplete      Studies:  Nm Hepatobiliary Liver Func  Result Date: 05/04/2019 CLINICAL DATA:  RIGHT upper quadrant pain. Diagnosis cholangiocarcinoma April 2020. EXAM: NUCLEAR MEDICINE HEPATOBILIARY IMAGING TECHNIQUE: Sequential images of the abdomen were obtained out to 60 minutes following intravenous administration of radiopharmaceutical. RADIOPHARMACEUTICALS:  5.3 mCi Tc-107mCholetec IV (1.5 millicurie booster dose of Choletec) COMPARISON:  Ultrasound 05/03/2019, MRI 01/20/2019 FINDINGS: Prompt clearance of radiotracer from the blood pool and homogeneous uptake in liver. Counts are evident in the common bile duct and proximal small bowel by 15 minutes. The gallbladder is not fill over 60 minutes of imaging. 2.7 mg of IV morphine was administered to contract the sphincter of Oddi. The gallbladder fails to fill following administration of morphine. IMPRESSION:  Non filling of the gallbladder suggest cystic duct obstruction. Recommend clinical correlation with acute cholecystitis. These results will be called to the ordering clinician or representative by the Radiologist Assistant, and communication documented in the PACS or zVision Dashboard. Electronically Signed   By: SSuzy BouchardM.D.   On: 05/04/2019 14:16   Ir Perc Cholecystostomy  Result Date: 05/04/2019 INDICATION: Cholecystitis, non operative  candidate EXAM: ULTRASOUND FLUOROSCOPIC PERCUTANEOUS TRANSHEPATIC CHOLECYSTOSTOMY MEDICATIONS: Patient is currently receiving IV antibiotics as an inpatient ANESTHESIA/SEDATION: Moderate (conscious) sedation was employed during this procedure. A total of Versed 2.0 mg and Fentanyl 50 mcg was administered intravenously. Moderate Sedation Time: 15 minutes. The patient's level of consciousness and vital signs were monitored continuously by radiology nursing throughout the procedure under my direct supervision. FLUOROSCOPY TIME:  Fluoroscopy Time: 0 minutes 36 seconds (7 mGy). COMPLICATIONS: None immediate. PROCEDURE: Informed written consent was obtained from the patient after a thorough discussion of the procedural risks, benefits and alternatives. All questions were addressed. Maximal Sterile Barrier Technique was utilized including caps, mask, sterile gowns, sterile gloves, sterile drape, hand hygiene and skin antiseptic. A timeout was performed prior to the initiation of the procedure. Previous imaging reviewed. Patient positioned supine. Preliminary ultrasound performed. The distended gallbladder with wall was localized in the right upper quadrant in the mid axillary line through a lower intercostal space. Overlying skin marked. Under sterile conditions and local anesthesia, a 21 gauge needle was advanced percutaneously a transhepatic approach into the gallbladder. Needle position confirmed with ultrasound. Images obtained for documentation. There was return of dark  bile. Sample sent for culture. Guidewire inserted followed by the Accustick dilator set. Amplatz guidewire exchange followed by tract dilatation to insert a 10 [place] drain. Drain catheter position confirmed with fluoroscopy and contrast injection. 65 cc total bile aspirated. Catheter secured with Prolene suture and connected to external gravity drainage bag. Sterile dressing applied. No immediate complication. Patient tolerated the procedure well. IMPRESSION: Successful ultrasound and fluoroscopic percutaneous transhepatic cholecystostomy. Electronically Signed   By: Jerilynn Mages.  Shick M.D.   On: 05/04/2019 17:22    Assessment: 73 y.o.  1.  Acute cholecystitis, status post percutaneous cholecystostomy by IR 05/04/2019 2.  Extrahepatic cholangiocarcinoma, unresectable, on chemo isplatin and gemcitabine  3. DM 4. HTN 5. CAD 6. Moderate anemia and thrombocytopenia   Plan:  -he is clinically much better, on iv antibiotics now, hopefully can go home in a few days -he is scheduled to see Dr.Clark at [facility] at the end of this month -his next chemo is scheduled for next Thursday. I will keep his appointment, if he recovers well and complete course of antibiotics, will probably resume chemo next week -please consider 1u PRBC tomorrow  -appreciate hospitalist team's excellent care    [name], MD 05/05/2019  9:47 PM

## 2019-05-05 NOTE — Progress Notes (Signed)
Referring Physician(s): Kalman Drape  Supervising Physician: Arne Cleveland  Patient Status:  [facility] - In-pt  Chief Complaint: None  Subjective:  Cholecystitis s/p percutaneous cholecystostomy tube placement 05/04/2019 by Dr. Annamaria Boots. Patient awake and alert sitting in bed watching TV with no complaints at this time. Cholecystostomy tube site c/d/i.   Allergies: Erythromycin  Medications: Prior to Admission medications   Medication Sig Start Date End Date Taking? Authorizing Provider  amLODipine (NORVASC) 5 MG tablet Take 5 mg by mouth daily.    Yes [provider]  atenolol (TENORMIN) 25 MG tablet Take by mouth daily. Once a day   Yes [provider]  atorvastatin (LIPITOR) 10 MG tablet Take 10 mg by mouth daily. 05/13/17  Yes [provider]  insulin NPH Human (NOVOLIN N) 100 UNIT/ML injection Inject 20 Units into the skin 2 (two) times a day.    Yes [provider]  insulin regular (NOVOLIN R) 100 units/mL injection Inject 0-40 Units into the skin 3 (three) times daily before meals. Sliding scale 0-40 units   Yes [provider]  lisinopril (ZESTRIL) 20 MG tablet Take 20 mg by mouth daily.  03/14/17  Yes [provider]  magnesium oxide (MAG-OX) 400 (241.3 Mg) MG tablet Take 1 tablet (400 mg total) by mouth daily. 03/12/19  Yes [name], MD  metFORMIN (GLUCOPHAGE) 1000 MG tablet Take 2,000 mg by mouth every evening.    Yes [provider]  ondansetron (ZOFRAN) 8 MG tablet TAKE 1 TABLET BY MOUTH 2 TIMES DAY AS NEEDED. START ON 3RD DAY AFTER CHEMOTHERAPY Patient taking differently: Take 8 mg by mouth 2 (two) times daily as needed. Start on 3rd day after chemotherapy 04/19/19  Yes [name], MD  oxycodone (OXY-IR) 5 MG capsule Take 1 capsule (5 mg total) by mouth every 6 (six) hours as needed for pain. 04/08/19  Yes Alla Feeling, NP  prochlorperazine (COMPAZINE) 10 MG tablet TAKE 1 TABLET (10 MG TOTAL) BY MOUTH EVERY 6  (SIX) HOURS AS NEEDED (NAUSEA OR VOMITING). Patient taking differently: Take 10 mg by mouth every 6 (six) hours as needed for nausea or vomiting (Nausea or vomiting).  04/19/19  Yes [name], MD  zolpidem (AMBIEN) 5 MG tablet Take 1-2 tablets (5-10 mg total) by mouth at bedtime as needed for sleep. 04/01/19  Yes [name], MD  Blood Glucose Monitoring Suppl (ACCU-CHEK NANO SMARTVIEW) W/DEVICE KIT 1 kit by Does not apply route 2 (two) times daily. Patient taking differently: 1 kit by Does not apply route See admin instructions. Test blood sugars 12x's daily 10/23/12   Denita Lung, MD  glucose blood test strip Test 3 times a day. Patient taking differently: 1 each by Other route See admin instructions. Test 12 times a day. 10/26/12   Denita Lung, MD  lidocaine-prilocaine (EMLA) cream Apply to affected area once Patient not taking: Reported on 05/03/2019 03/08/19   [name], MD  citalopram (CELEXA) 20 MG tablet Take 1 tablet (20 mg total) by mouth daily. 02/26/11 12/05/11  Denita Lung, MD  clonazePAM (KLONOPIN) 0.5 MG tablet Take 0.5 mg by mouth 2 (two) times daily as needed.    12/05/11  [provider]  lamoTRIgine (LAMICTAL) 100 MG tablet Take 100 mg by mouth daily. 1/2 TABLET QD   12/05/11  [provider]     Vital Signs: BP 128/71 (BP Location: Left Arm)   Pulse 77   Temp 98 F (36.7 C) (Oral)  Resp 12   Ht 5' 7"  (1.702 m)   Wt 146 lb 3.2 oz (66.3 kg)   SpO2 98%   BMI 22.90 kg/m   Physical Exam Vitals signs and nursing note reviewed.  Constitutional:      General: He is not in acute distress.    Appearance: Normal appearance.  Pulmonary:     Effort: Pulmonary effort is normal. No respiratory distress.  Abdominal:     Comments: Cholecystostomy tube site without tenderness, erythema, drainage, or active bleeding; approximately 75 cc bilious fluid with bloody debris in gravity bag.  Skin:    General: Skin is warm and dry.  Neurological:     Mental Status: He is  alert and oriented to person, place, and time.  Psychiatric:        Mood and Affect: Mood normal.        Behavior: Behavior normal.        Thought Content: Thought content normal.        Judgment: Judgment normal.     Imaging: Nm Hepatobiliary Liver Func  Result Date: 05/04/2019 CLINICAL DATA:  RIGHT upper quadrant pain. Diagnosis cholangiocarcinoma April 2020. EXAM: NUCLEAR MEDICINE HEPATOBILIARY IMAGING TECHNIQUE: Sequential images of the abdomen were obtained out to 60 minutes following intravenous administration of radiopharmaceutical. RADIOPHARMACEUTICALS:  5.3 mCi Tc-29mCholetec IV (1.5 millicurie booster dose of Choletec) COMPARISON:  Ultrasound 05/03/2019, MRI 01/20/2019 FINDINGS: Prompt clearance of radiotracer from the blood pool and homogeneous uptake in liver. Counts are evident in the common bile duct and proximal small bowel by 15 minutes. The gallbladder is not fill over 60 minutes of imaging. 2.7 mg of IV morphine was administered to contract the sphincter of Oddi. The gallbladder fails to fill following administration of morphine. IMPRESSION: Non filling of the gallbladder suggest cystic duct obstruction. Recommend clinical correlation with acute cholecystitis. These results will be called to the ordering clinician or representative by the Radiologist Assistant, and communication documented in the PACS or zVision Dashboard. Electronically Signed   By: SSuzy BouchardM.D.   On: 05/04/2019 14:16   UKoreaAbdomen Limited  Result Date: 05/03/2019 CLINICAL DATA:  Right upper quadrant abdominal pain. Known cholangiocarcinoma. EXAM: ULTRASOUND ABDOMEN LIMITED RIGHT UPPER QUADRANT COMPARISON:  MRI abdomen 01/20/2019 FINDINGS: Gallbladder: Gallbladder is distended and contains a large amount of echogenic sludge. The gallbladder wall is slightly thickened measuring a maximum of 5 mm. No pericholecystic fluid. No sonographic Murphy sign. Common bile duct: Diameter: Difficult to identify and  measure. No obvious dilatation. Patient has a plastic common bile duct stent in place. Liver: No intrahepatic biliary dilatation. No worrisome hepatic lesions. No perihepatic fluid collections. Portal vein is patent on color Doppler imaging with normal direction of blood flow towards the liver. Other: Slightly complex upper pole right renal cyst is noted. IMPRESSION: 1. Mildly distended gallbladder with large amount of echogenic sludge. There is also moderate gallbladder wall thickening. Could not exclude a calculus cholecystitis. 2. Difficult to identify and measure the common bile duct. Patient has a known plastic common bile duct stent in place and there is no obvious biliary dilatation. 3. No worrisome hepatic lesions. Electronically Signed   By: PMarijo SanesM.D.   On: 05/03/2019 13:28   Ir Perc Cholecystostomy  Result Date: 05/04/2019 INDICATION: Cholecystitis, non operative candidate EXAM: ULTRASOUND FLUOROSCOPIC PERCUTANEOUS TRANSHEPATIC CHOLECYSTOSTOMY MEDICATIONS: Patient is currently receiving IV antibiotics as an inpatient ANESTHESIA/SEDATION: Moderate (conscious) sedation was employed during this procedure. A total of Versed 2.0 mg and Fentanyl 50  mcg was administered intravenously. Moderate Sedation Time: 15 minutes. The patient's level of consciousness and vital signs were monitored continuously by radiology nursing throughout the procedure under my direct supervision. FLUOROSCOPY TIME:  Fluoroscopy Time: 0 minutes 36 seconds (7 mGy). COMPLICATIONS: None immediate. PROCEDURE: Informed written consent was obtained from the patient after a thorough discussion of the procedural risks, benefits and alternatives. All questions were addressed. Maximal Sterile Barrier Technique was utilized including caps, mask, sterile gowns, sterile gloves, sterile drape, hand hygiene and skin antiseptic. A timeout was performed prior to the initiation of the procedure. Previous imaging reviewed. Patient positioned  supine. Preliminary ultrasound performed. The distended gallbladder with wall was localized in the right upper quadrant in the mid axillary line through a lower intercostal space. Overlying skin marked. Under sterile conditions and local anesthesia, a 21 gauge needle was advanced percutaneously a transhepatic approach into the gallbladder. Needle position confirmed with ultrasound. Images obtained for documentation. There was return of dark bile. Sample sent for culture. Guidewire inserted followed by the Accustick dilator set. Amplatz guidewire exchange followed by tract dilatation to insert a 10 [place] drain. Drain catheter position confirmed with fluoroscopy and contrast injection. 65 cc total bile aspirated. Catheter secured with Prolene suture and connected to external gravity drainage bag. Sterile dressing applied. No immediate complication. Patient tolerated the procedure well. IMPRESSION: Successful ultrasound and fluoroscopic percutaneous transhepatic cholecystostomy. Electronically Signed   By: Jerilynn Mages.  Shick M.D.   On: 05/04/2019 17:22    Labs:  CBC: Recent Labs    05/03/19 1622 05/04/19 0449 05/04/19 1530 05/05/19 0949  WBC 6.1 3.9* 3.7* 4.5  HGB 8.8* 7.8* 7.7* 7.6*  HCT 26.9* 23.5* 23.2* 22.7*  PLT 122* 86* 75* 76*    COAGS: Recent Labs    03/08/19 1254  INR 1.0    BMP: Recent Labs    05/03/19 1030 05/03/19 1622 05/04/19 0449 05/04/19 1530 05/05/19 0949  NA 134* 133* 133*  --  135  K 5.4* 4.2 3.8  --  3.7  CL 99 99 102  --  105  CO2 27 24 23   --  24  GLUCOSE 155* 253* 218*  --  180*  BUN 30* 30* 26*  --  17  CALCIUM 9.6 8.8* 8.0*  --  7.7*  CREATININE 1.26* 1.09 1.07 0.93 0.96  GFRNONAA 57* >60 >60 >60 >60  GFRAA >60 >60 >60 >60 >60    LIVER FUNCTION TESTS: Recent Labs    05/03/19 1030 05/03/19 1622 05/04/19 0449 05/05/19 0949  BILITOT 0.6 1.0 0.9 0.6  AST 16 16 19 24   ALT 17 19 20 26   ALKPHOS 65 59 50 46  PROT 6.9 6.8 5.9* 5.4*  ALBUMIN 3.1* 3.4* 2.8*  2.3*    Assessment and Plan:  Cholecystitis s/p percutaneous cholecystostomy tube placement 05/04/2019 by Dr. Annamaria Boots. Cholecystostomy tube stable with approximately 75 cc bilious fluid with bloody debris in gravity bag. Continue current drain management- continue with Qshift flushes/monitor of output. Further plans per TR[facility]/CCS- appreciate and agree with management. IR to follow.   Electronically Signed: Earley Abide, PA-C 05/05/2019, 5:03 PM   I spent a total of 25 Minutes at the the patient's bedside AND on the patient's hospital floor or unit, greater than 50% of which was counseling/coordinating care for cholecystitis s/p cholecystostomy tube placement.

## 2019-05-06 LAB — GLUCOSE, CAPILLARY
Glucose-Capillary: 199 mg/dL — ABNORMAL HIGH (ref 70–99)
Glucose-Capillary: 256 mg/dL — ABNORMAL HIGH (ref 70–99)
Glucose-Capillary: 274 mg/dL — ABNORMAL HIGH (ref 70–99)
Glucose-Capillary: 248 mg/dL — ABNORMAL HIGH (ref 70–99)

## 2019-05-06 LAB — PREPARE RBC (CROSSMATCH)

## 2019-05-06 LAB — HEMOGLOBIN AND HEMATOCRIT, BLOOD
HCT: 23.4 % — ABNORMAL LOW (ref 39.0–52.0)
HCT: 27.2 % — ABNORMAL LOW (ref 39.0–52.0)
Hemoglobin: 7.6 g/dL — ABNORMAL LOW (ref 13.0–17.0)
Hemoglobin: 9.1 g/dL — ABNORMAL LOW (ref 13.0–17.0)

## 2019-05-06 LAB — ABO/RH: ABO/RH(D): A POS

## 2019-05-06 MED ORDER — ONDANSETRON HCL 4 MG/2ML IJ SOLN
4.0000 mg | Freq: Four times a day (QID) | INTRAMUSCULAR | Status: DC | PRN
  Administered 2019-05-06 – 2019-05-07 (×3): 4 mg via INTRAVENOUS
  Filled 2019-05-06 (×3): qty 2

## 2019-05-06 MED ORDER — INSULIN GLARGINE 100 UNIT/ML ~~LOC~~ SOLN
10.0000 [IU] | Freq: Every day | SUBCUTANEOUS | Status: DC
  Administered 2019-05-06: 10 [IU] via SUBCUTANEOUS
  Filled 2019-05-06 (×2): qty 0.1

## 2019-05-06 MED ORDER — SODIUM CHLORIDE 0.9% IV SOLUTION
Freq: Once | INTRAVENOUS | Status: AC
  Administered 2019-05-06: 13:00:00 via INTRAVENOUS

## 2019-05-06 NOTE — Progress Notes (Signed)
   Supervising Physician: Watts, John  Patient Status:  [facility] - In-pt  Chief Complaint:  Cholecystitis  Subjective: Resting comfortably.  Feeling better although is for blood transfusion today.    Allergies: Erythromycin  Medications: Prior to Admission medications   Medication Sig Start Date End Date Taking? Authorizing Provider  amLODipine (NORVASC) 5 MG tablet Take 5 mg by mouth daily.    Yes [provider]  atenolol (TENORMIN) 25 MG tablet Take by mouth daily. Once a day   Yes [provider]  atorvastatin (LIPITOR) 10 MG tablet Take 10 mg by mouth daily. 05/13/17  Yes [provider]  insulin NPH Human (NOVOLIN N) 100 UNIT/ML injection Inject 20 Units into the skin 2 (two) times a day.    Yes [provider]  insulin regular (NOVOLIN R) 100 units/mL injection Inject 0-40 Units into the skin 3 (three) times daily before meals. Sliding scale 0-40 units   Yes [provider]  lisinopril (ZESTRIL) 20 MG tablet Take 20 mg by mouth daily.  03/14/17  Yes [provider]  magnesium oxide (MAG-OX) 400 (241.3 Mg) MG tablet Take 1 tablet (400 mg total) by mouth daily. 03/12/19  Yes Feng, Yan, MD  metFORMIN (GLUCOPHAGE) 1000 MG tablet Take 2,000 mg by mouth every evening.    Yes [provider]  ondansetron (ZOFRAN) 8 MG tablet TAKE 1 TABLET BY MOUTH 2 TIMES DAY AS NEEDED. START ON 3RD DAY AFTER CHEMOTHERAPY Patient taking differently: Take 8 mg by mouth 2 (two) times daily as needed. Start on 3rd day after chemotherapy 04/19/19  Yes Feng, Yan, MD  oxycodone (OXY-IR) 5 MG capsule Take 1 capsule (5 mg total) by mouth every 6 (six) hours as needed for pain. 04/08/19  Yes Burton, Lacie K, NP  prochlorperazine (COMPAZINE) 10 MG tablet TAKE 1 TABLET (10 MG TOTAL) BY MOUTH EVERY 6 (SIX) HOURS AS NEEDED (NAUSEA OR VOMITING). Patient taking differently: Take 10 mg by mouth every 6 (six) hours as needed for nausea or vomiting (Nausea or  vomiting).  04/19/19  Yes Feng, Yan, MD  zolpidem (AMBIEN) 5 MG tablet Take 1-2 tablets (5-10 mg total) by mouth at bedtime as needed for sleep. 04/01/19  Yes Feng, Yan, MD  Blood Glucose Monitoring Suppl (ACCU-CHEK NANO SMARTVIEW) W/DEVICE KIT 1 kit by Does not apply route 2 (two) times daily. Patient taking differently: 1 kit by Does not apply route See admin instructions. Test blood sugars 12x's daily 10/23/12   Lalonde, John C, MD  glucose blood test strip Test 3 times a day. Patient taking differently: 1 each by Other route See admin instructions. Test 12 times a day. 10/26/12   Lalonde, John C, MD  lidocaine-prilocaine (EMLA) cream Apply to affected area once Patient not taking: Reported on 05/03/2019 03/08/19   Feng, Yan, MD  citalopram (CELEXA) 20 MG tablet Take 1 tablet (20 mg total) by mouth daily. 02/26/11 12/05/11  Lalonde, John C, MD  clonazePAM (KLONOPIN) 0.5 MG tablet Take 0.5 mg by mouth 2 (two) times daily as needed.    12/05/11  [provider]  lamoTRIgine (LAMICTAL) 100 MG tablet Take 100 mg by mouth daily. 1/2 TABLET QD   12/05/11  [provider]     Vital Signs: BP 139/77   Pulse 99   Temp 97.9 F (36.6 C) (Oral)   Resp 20   Ht 5' 7" (1.702 m)   Wt 146 lb 3.2 oz (66.3 kg)   SpO2 98%     BMI 22.90 kg/m   Physical Exam  NAD, Alert  Abdomen:  Cholecystostomy tube in place.  Insertion site intact, clean, and dry.  Bilious, bloody output with debris in collection bag.  Mildly tender.   Imaging: Nm Hepatobiliary Liver Func  Result Date: 05/04/2019 CLINICAL DATA:  RIGHT upper quadrant pain. Diagnosis cholangiocarcinoma April 2020. EXAM: NUCLEAR MEDICINE HEPATOBILIARY IMAGING TECHNIQUE: Sequential images of the abdomen were obtained out to 60 minutes following intravenous administration of radiopharmaceutical. RADIOPHARMACEUTICALS:  5.3 mCi Tc-99m Choletec IV (1.5 millicurie booster dose of Choletec) COMPARISON:  Ultrasound 05/03/2019, MRI 01/20/2019 FINDINGS: Prompt  clearance of radiotracer from the blood pool and homogeneous uptake in liver. Counts are evident in the common bile duct and proximal small bowel by 15 minutes. The gallbladder is not fill over 60 minutes of imaging. 2.7 mg of IV morphine was administered to contract the sphincter of Oddi. The gallbladder fails to fill following administration of morphine. IMPRESSION: Non filling of the gallbladder suggest cystic duct obstruction. Recommend clinical correlation with acute cholecystitis. These results will be called to the ordering clinician or representative by the Radiologist Assistant, and communication documented in the PACS or zVision Dashboard. Electronically Signed   By: Stewart  Edmunds M.D.   On: 05/04/2019 14:16   Us Abdomen Limited  Result Date: 05/03/2019 CLINICAL DATA:  Right upper quadrant abdominal pain. Known cholangiocarcinoma. EXAM: ULTRASOUND ABDOMEN LIMITED RIGHT UPPER QUADRANT COMPARISON:  MRI abdomen 01/20/2019 FINDINGS: Gallbladder: Gallbladder is distended and contains a large amount of echogenic sludge. The gallbladder wall is slightly thickened measuring a maximum of 5 mm. No pericholecystic fluid. No sonographic Murphy sign. Common bile duct: Diameter: Difficult to identify and measure. No obvious dilatation. Patient has a plastic common bile duct stent in place. Liver: No intrahepatic biliary dilatation. No worrisome hepatic lesions. No perihepatic fluid collections. Portal vein is patent on color Doppler imaging with normal direction of blood flow towards the liver. Other: Slightly complex upper pole right renal cyst is noted. IMPRESSION: 1. Mildly distended gallbladder with large amount of echogenic sludge. There is also moderate gallbladder wall thickening. Could not exclude a calculus cholecystitis. 2. Difficult to identify and measure the common bile duct. Patient has a known plastic common bile duct stent in place and there is no obvious biliary dilatation. 3. No worrisome hepatic  lesions. Electronically Signed   By: P.  Gallerani M.D.   On: 05/03/2019 13:28   Ir Perc Cholecystostomy  Result Date: 05/04/2019 INDICATION: Cholecystitis, non operative candidate EXAM: ULTRASOUND FLUOROSCOPIC PERCUTANEOUS TRANSHEPATIC CHOLECYSTOSTOMY MEDICATIONS: Patient is currently receiving IV antibiotics as an inpatient ANESTHESIA/SEDATION: Moderate (conscious) sedation was employed during this procedure. A total of Versed 2.0 mg and Fentanyl 50 mcg was administered intravenously. Moderate Sedation Time: 15 minutes. The patient's level of consciousness and vital signs were monitored continuously by radiology nursing throughout the procedure under my direct supervision. FLUOROSCOPY TIME:  Fluoroscopy Time: 0 minutes 36 seconds (7 mGy). COMPLICATIONS: None immediate. PROCEDURE: Informed written consent was obtained from the patient after a thorough discussion of the procedural risks, benefits and alternatives. All questions were addressed. Maximal Sterile Barrier Technique was utilized including caps, mask, sterile gowns, sterile gloves, sterile drape, hand hygiene and skin antiseptic. A timeout was performed prior to the initiation of the procedure. Previous imaging reviewed. Patient positioned supine. Preliminary ultrasound performed. The distended gallbladder with wall was localized in the right upper quadrant in the mid axillary line through a lower intercostal space. Overlying skin marked. Under sterile conditions and local anesthesia, a   21 gauge needle was advanced percutaneously a transhepatic approach into the gallbladder. Needle position confirmed with ultrasound. Images obtained for documentation. There was return of dark bile. Sample sent for culture. Guidewire inserted followed by the Accustick dilator set. Amplatz guidewire exchange followed by tract dilatation to insert a 10 French drain. Drain catheter position confirmed with fluoroscopy and contrast injection. 65 cc total bile aspirated.  Catheter secured with Prolene suture and connected to external gravity drainage bag. Sterile dressing applied. No immediate complication. Patient tolerated the procedure well. IMPRESSION: Successful ultrasound and fluoroscopic percutaneous transhepatic cholecystostomy. Electronically Signed   By: M.  Shick M.D.   On: 05/04/2019 17:22    Labs:  CBC: Recent Labs    05/03/19 1622 05/04/19 0449 05/04/19 1530 05/05/19 0949 05/06/19 0409  WBC 6.1 3.9* 3.7* 4.5  --   HGB 8.8* 7.8* 7.7* 7.6* 7.6*  HCT 26.9* 23.5* 23.2* 22.7* 23.4*  PLT 122* 86* 75* 76*  --     COAGS: Recent Labs    03/08/19 1254  INR 1.0    BMP: Recent Labs    05/03/19 1030 05/03/19 1622 05/04/19 0449 05/04/19 1530 05/05/19 0949  NA 134* 133* 133*  --  135  K 5.4* 4.2 3.8  --  3.7  CL 99 99 102  --  105  CO2 27 24 23  --  24  GLUCOSE 155* 253* 218*  --  180*  BUN 30* 30* 26*  --  17  CALCIUM 9.6 8.8* 8.0*  --  7.7*  CREATININE 1.26* 1.09 1.07 0.93 0.96  GFRNONAA 57* >60 >60 >60 >60  GFRAA >60 >60 >60 >60 >60    LIVER FUNCTION TESTS: Recent Labs    05/03/19 1030 05/03/19 1622 05/04/19 0449 05/05/19 0949  BILITOT 0.6 1.0 0.9 0.6  AST 16 16 19 24  ALT 17 19 20 26  ALKPHOS 65 59 50 46  PROT 6.9 6.8 5.9* 5.4*  ALBUMIN 3.1* 3.4* 2.8* 2.3*    Assessment and Plan: Cholecystitis s/p percutaneous cholecystostomy tube placement 05/04/2019 by Dr. Shick. Cholecystostomy tube stable with bilious output and debris in collection bag.  HgB 7.6-- getting transfusion Culture pending but with Kelbsiella pneumoiae. Afebrile.  Continue current drain management- continue with Qshift flushes/monitor of output. Further plans per [facility]- appreciate and agree with management. IR to follow.   Electronically Signed: Kacie Sue-Ellen Matthews, PA 05/06/2019, 1:38 PM   I spent a total of 15 Minutes at the the patient's bedside AND on the patient's hospital floor or unit, greater than 50% of which was  counseling/coordinating care for cholecystitis. 

## 2019-05-06 NOTE — Progress Notes (Signed)
PROGRESS NOTE  Christopher Welch LKT:[id number] DOB: 08-Jul-1946   PCP: [name], DO  Patient is from: Home  DOA: 05/03/2019 LOS: 3  Brief Narrative / Interim history: 73 year old male with history of IDDM-2, HTN, HLD, cholangiocarcinoma currently on chemo presented to Oncology office with severe RUQ pain.RUQ ultrasound revealed mildly distended gallbladder with large amount of echogenic sludge and moderate gallbladder wall thickening suspicious for cholecystitis, and admitted on 05/03/2019 for further care. He is status post 3 cycles of chemo, and currently on cisplatin, gemcitabine and Abraxane with [name] support which was started the day of admission.  In ED, mildly elevated temp.  WBC 6.1.  Hgb 8.8.  Platelet 122.  Started on IV meropenem.  General surgery consulted and recommended HIDA scan which was suspicious for cystic duct obstruction.  Patient had percutaneous cholecystostomy by IR on 05/04/2019.  Surgical tissue cultures growing polymicrobials including Klebsiella pneumonia.  Further speciation and sensitivity pending.  Subjective: No major events overnight of this morning.  About 105 cc from percutaneous colostomy.  Denies chest pain, dyspnea, abdominal pain, diarrhea or GU symptoms.  Ambulated in the room without problem.  Objective: Vitals:   05/05/19 2118 05/06/19 0506 05/06/19 1230 05/06/19 1304  BP: 138/70 (!) 147/77 136/82 139/77  Pulse: 86 74 (!) 105 99  Resp: 19 20 20 20   Temp: 98.4 F (36.9 C) 97.7 F (36.5 C) 98.2 F (36.8 C) 97.9 F (36.6 C)  TempSrc: Oral   Oral  SpO2: 98% 100% 100% 98%  Weight:      Height:        Intake/Output Summary (Last 24 hours) at 05/06/2019 1334 Last data filed at 05/06/2019 0900 Gross per 24 hour  Intake 1443.91 ml  Output 1105 ml  Net 338.91 ml   Filed Weights   05/03/19 1517  Weight: 66.3 kg    Examination:  GENERAL: No acute distress.  Appears well.  HEENT: MMM.  Vision and hearing grossly intact.  NECK: Supple.  No  apparent JVD.  RESP:  No IWOB. Good air movement bilaterally. CVS:  RRR. Heart sounds normal.  ABD/GI/GU: Bowel sounds present. Soft. Non tender.  Percutaneous cholecystostomy with some drainage. MSK/EXT:  Moves extremities. No apparent deformity or edema.  SKIN: no apparent skin lesion or wound NEURO: Awake, alert and oriented appropriately.  No gross deficit.  PSYCH: Calm. Normal affect.   Port a cath over right chest.  I have personally reviewed the following labs and images:  Radiology Studies: No results found.  Microbiology: Recent Results (from the past 240 hour(s))  Blood culture (routine x 2)     Status: None (Preliminary result)   Collection Time: 05/03/19  5:17 PM   Specimen: BLOOD  Result Value Ref Range Status   Specimen Description   Final    BLOOD RIGHT ANTECUBITAL Performed at [facility] [address], [place], [place] [address]    Special Requests   Final    BOTTLES DRAWN AEROBIC AND ANAEROBIC Blood Culture results may not be optimal due to an excessive volume of blood received in culture bottles Performed at [facility] [address] [place], [place] [address]    Culture   Final    NO GROWTH 2 DAYS Performed at [facility] [address], [place], [place] [address]    Report Status PENDING  Incomplete  Blood culture (routine x 2)     Status: None (Preliminary result)   Collection Time: 05/03/19  5:17 PM  Specimen: BLOOD  Result Value Ref Range Status   Specimen Description   Final    BLOOD LEFT ANTECUBITAL Performed at [facility] [address], [place], [place] [address]    Special Requests   Final    BOTTLES DRAWN AEROBIC AND ANAEROBIC Blood Culture results may not be optimal due to an excessive volume of blood received in culture bottles Performed at [facility] [address], [place], [place] [address]    Culture   Final    NO GROWTH 2 DAYS  Performed at [facility] [address], [place], [place] [address]    Report Status PENDING  Incomplete  SARS Coronavirus 2 [facility] order, Performed in [facility] hospital lab) Nasopharyngeal Nasopharyngeal Swab     Status: None   Collection Time: 05/03/19  9:12 PM   Specimen: Nasopharyngeal Swab  Result Value Ref Range Status   SARS Coronavirus 2 NEGATIVE NEGATIVE Final    Comment: (NOTE) If result is NEGATIVE SARS-CoV-2 target nucleic acids are NOT DETECTED. The SARS-CoV-2 RNA is generally detectable in upper and lower  respiratory specimens during the acute phase of infection. The lowest  concentration of SARS-CoV-2 viral copies this assay can detect is 250  copies / mL. A negative result does not preclude SARS-CoV-2 infection  and should not be used as the sole basis for treatment or other  patient management decisions.  A negative result may occur with  improper specimen collection / handling, submission of specimen other  than nasopharyngeal swab, presence of viral mutation(s) within the  areas targeted by this assay, and inadequate number of viral copies  (<250 copies / mL). A negative result must be combined with clinical  observations, patient history, and epidemiological information. If result is POSITIVE SARS-CoV-2 target nucleic acids are DETECTED. The SARS-CoV-2 RNA is generally detectable in upper and lower  respiratory specimens dur ing the acute phase of infection.  Positive  results are indicative of active infection with SARS-CoV-2.  Clinical  correlation with patient history and other diagnostic information is  necessary to determine patient infection status.  Positive results do  not rule out bacterial infection or co-infection with other viruses. If result is PRESUMPTIVE POSTIVE SARS-CoV-2 nucleic acids MAY BE PRESENT.   A presumptive positive result was obtained on the submitted specimen  and confirmed on repeat testing.  While 2019 novel  coronavirus  (SARS-CoV-2) nucleic acids may be present in the submitted sample  additional confirmatory testing may be necessary for epidemiological  and / or clinical management purposes  to differentiate between  SARS-CoV-2 and other Sarbecovirus currently known to infect humans.  If clinically indicated additional testing with an alternate test  methodology [phone]) is advised. The SARS-CoV-2 RNA is generally  detectable in upper and lower respiratory sp ecimens during the acute  phase of infection. The expected result is Negative. Fact Sheet for Patients:  StrictlyIdeas.no Fact Sheet for Healthcare Providers: BankingDealers.co.za This test is not yet approved or cleared by the [place] FDA and has been authorized for detection and/or diagnosis of SARS-CoV-2 by FDA under an Emergency Use Authorization (EUA).  This EUA will remain in effect (meaning this test can be used) for the duration of the COVID-19 declaration under Section 564(b)(1) of the Act, 21 U.S.C. section 360bbb-3(b)(1), unless the authorization is terminated or revoked sooner. Performed at [facility], [place] [address], [place], [place] [address]   Aerobic/Anaerobic Culture (surgical/deep wound)     Status:  None (Preliminary result)   Collection Time: 05/04/19  5:17 PM   Specimen: Gallbladder; Abscess  Result Value Ref Range Status   Specimen Description   Final    GALL BLADDER Performed at [facility] [address] [place], [place] [address]    Special Requests   Final    Normal Performed at [facility], [place] [address] [place], [place] [address]    Gram Stain   Final    NO WBC SEEN FEW GRAM NEGATIVE RODS FEW GRAM VARIABLE ROD RARE GRAM POSITIVE COCCI    Culture   Final    ABUNDANT KLEBSIELLA PNEUMONIAE CULTURE REINCUBATED FOR BETTER GROWTH Performed at [facility], [place] [address], [place], [place] [address]    Report Status PENDING  Incomplete   Organism ID, Bacteria KLEBSIELLA PNEUMONIAE  Final      Susceptibility   Klebsiella pneumoniae - MIC*    AMPICILLIN >=32 RESISTANT Resistant     CEFAZOLIN <=4 SENSITIVE Sensitive     CEFEPIME <=1 SENSITIVE Sensitive     CEFTAZIDIME <=1 SENSITIVE Sensitive     CEFTRIAXONE <=1 SENSITIVE Sensitive     CIPROFLOXACIN <=0.25 SENSITIVE Sensitive     GENTAMICIN <=1 SENSITIVE Sensitive     IMIPENEM <=0.25 SENSITIVE Sensitive     TRIMETH/SULFA <=20 SENSITIVE Sensitive     AMPICILLIN/SULBACTAM 8 SENSITIVE Sensitive     PIP/TAZO 16 SENSITIVE Sensitive     Extended ESBL NEGATIVE Sensitive     * ABUNDANT KLEBSIELLA PNEUMONIAE    Sepsis Labs: Invalid input(s): PROCALCITONIN, LACTICIDVEN  Urine analysis: No results found for: COLORURINE, APPEARANCEUR, LABSPEC, PHURINE, GLUCOSEU, HGBUR, BILIRUBINUR, KETONESUR, PROTEINUR, UROBILINOGEN, NITRITE, LEUKOCYTESUR  Anemia Panel: No results for input(s): VITAMINB12, FOLATE, FERRITIN, TIBC, IRON, RETICCTPCT in the last 72 hours.  Thyroid Function Tests: No results for input(s): TSH, T4TOTAL, FREET4, T3FREE, THYROIDAB in the last 72 hours.  Lipid Profile: No results for input(s): CHOL, HDL, LDLCALC, TRIG, CHOLHDL, LDLDIRECT in the last 72 hours.  CBG: Recent Labs  Lab 05/05/19 1135 05/05/19 1704 05/05/19 2117 05/06/19 0744 05/06/19 1205  GLUCAP 144* 270* 236* 199* 256*    HbA1C: Recent Labs    05/03/19 1622 05/05/19 0949  HGBA1C 7.7* 7.5*    BNP (last 3 results): No results for input(s): PROBNP in the last 8760 hours.  Cardiac Enzymes: No results for input(s): CKTOTAL, CKMB, CKMBINDEX, TROPONINI in the last 168 hours.  Coagulation Profile: No results for input(s): INR, PROTIME in the last 168 hours.  Liver Function Tests: Recent Labs  Lab 05/03/19 1030 05/03/19 1622 05/04/19 0449 05/05/19 0949  AST 16 16 19 24   ALT 17 19 20 26   ALKPHOS 65 59 50 46  BILITOT  0.6 1.0 0.9 0.6  PROT 6.9 6.8 5.9* 5.4*  ALBUMIN 3.1* 3.4* 2.8* 2.3*   Recent Labs  Lab 05/03/19 1622  LIPASE 17   No results for input(s): AMMONIA in the last 168 hours.  Basic Metabolic Panel: Recent Labs  Lab 05/03/19 1030 05/03/19 1622 05/04/19 0449 05/04/19 1530 05/05/19 0949  NA 134* 133* 133*  --  135  K 5.4* 4.2 3.8  --  3.7  CL 99 99 102  --  105  CO2 27 24 23   --  24  GLUCOSE 155* 253* 218*  --  180*  BUN 30* 30* 26*  --  17  CREATININE 1.26* 1.09 1.07 0.93 0.96  CALCIUM 9.6 8.8* 8.0*  --  7.7*  MG 1.9  --   --   --  1.8   GFR: Estimated Creatinine Clearance: 66 mL/min (by C-G formula based on SCr of 0.96 mg/dL).  CBC: Recent Labs  Lab 05/03/19 1030 05/03/19 1622 05/04/19 0449 05/04/19 1530 05/05/19 0949 05/06/19 0409  WBC 7.1 6.1 3.9* 3.7* 4.5  --   NEUTROABS 5.8 5.1  --   --   --   --   HGB 9.7* 8.8* 7.8* 7.7* 7.6* 7.6*  HCT 29.2* 26.9* 23.5* 23.2* 22.7* 23.4*  MCV 92.1 92.8 93.6 93.9 92.7  --   PLT 156 122* 86* 75* 76*  --     Procedures:  None  Microbiology summarized: [id number] negative.  Assessment & Plan: RUQ pain/acute cholecystitis: Improved. -RUQ [place] and HIDA concerning for cholecystitis with cystic duct blockage. -Deemed to be not a candidate for lap chole due to cholangiocarcinoma. -Percutaneous cholecystostomy by IR on 05/04/2019. -Surgical culture grew polymicrobial including Klebsiella pneumonia. -Meropenem 8/3-8/4 -Zosyn 8/4>>> -Blood cultures negative so far. -Patient to be discharged with cholecystostomy and follow-up at [facility]   Fairly-controlled IDDM-2: A1c 7.5%.  CBG elevated. -CBG monitoring and sliding scale -Start Lantus 10 units daily. -Resume home statin  Essential hypertension: Normotensive -PRN labetalol. -We will resume home amlodipine and lisinopril if needed.  Cholangiocarcinoma -Will FYI his oncologist  Anemia of chronic disease/thrombocytopenia: Baseline Hgb 9-11.  -Hgb 8.8 (admit)>7.8>7.6> 1 unit  -Thrombocytopenia at baseline. -Appreciate oncology input.  DVT prophylaxis: SCD Code Status: Full code Family Communication: Per patient, has no family. Disposition Plan: Remains inpatient pending surgical culture speciation and sensitivity Consultants: General surgery, IR.  Oncology   Antimicrobials: Anti-infectives (From admission, onward)   Start     Dose/Rate Route Frequency Ordered Stop   05/04/19 1400  piperacillin-tazobactam (ZOSYN) IVPB 3.375 g     3.375 g 12.5 mL/hr over 240 Minutes Intravenous Every 8 hours 05/04/19 1020     05/03/19 2000  meropenem (MERREM) 1 g in sodium chloride 0.9 % 100 mL IVPB  Status:  Discontinued     1 g 200 mL/hr over 30 Minutes Intravenous Every 8 hours 05/03/19 1826 05/04/19 1018   05/03/19 1800  piperacillin-tazobactam (ZOSYN) IVPB 3.375 g     3.375 g 100 mL/hr over 30 Minutes Intravenous  Once 05/03/19 1753 05/03/19 1907      Sch Meds:  Scheduled Meds: . sodium chloride   Intravenous Once  . insulin aspart  0-5 Units Subcutaneous QHS  . insulin aspart  0-9 Units Subcutaneous TID WC  . insulin glargine  10 Units Subcutaneous Daily  . morphine      . morphine  2.7 mg Intravenous Once  . sodium chloride flush  5 mL Intracatheter Q8H   Continuous Infusions: . sodium chloride 10 mL/hr at 05/06/19 0300  . piperacillin-tazobactam (ZOSYN)  IV 3.375 g (05/06/19 0535)   PRN Meds:.sodium chloride, HYDROmorphone (DILAUDID) injection, ketorolac, labetalol, ondansetron (ZOFRAN) IV, sodium chloride flush, zolpidem   Xavien Dauphinais T. [place]  If 7PM-7AM, please contact night-coverage [email or web address] Password [email or web address] 05/06/2019, 1:34 PM

## 2019-05-06 NOTE — Progress Notes (Signed)
[facility]/Trauma Progress Note      Assessment/Plan Diabetes, insulin-dependent for 29 years HTN History of hep B CAD-followed by Dr. Ellyn Hack Anemia: Hemoglobin 8.8 on admission Cholangiocarcinoma-diagnosed in April 2020 via ERCP by Dr. Debbora Lacrosse was undergoing Whipple at [facility] but they aborted due to likely metastatic disease in the vasculature,currently undergoing chemo and has follow up at [facility] at the end of this month with Dr. Carlis Abbott for possible surgical planning for this fall?  RUQ abdominal pain likely cholecystitis -HIDA scan+ suggesting cholecystitis - S/P IR per chole drain, 08/04 - follow cultures, preliminary results show Klebsiella  - okay for discharge from surgical standpoint pending cultures, total abx treatment for 10-14 days  FEN:HH/carb mod VTE: SCD's,chemical prophylaxis per medicine OB:[id number]>>  Recommend 10-14 days total of antibiotics to treat cholecystitis Foley:none Follow up:TBD  DISPO:continue perc chole drain and antibiotics   LOS: 3 days    Subjective: CC: cholecystitis   No complaints. Feeling much better and eating without pain. No issues overnight. Pt is getting blood today.  Objective: Vital signs in last 24 hours: Temp:  [97.7 F (36.5 C)-98.4 F (36.9 C)] 97.7 F (36.5 C) (08/06 0506) Pulse Rate:  [74-86] 74 (08/06 0506) Resp:  [12-20] 20 (08/06 0506) BP: (128-147)/(70-77) 147/77 (08/06 0506) SpO2:  [98 %-100 %] 100 % (08/06 0506)    Intake/Output from previous day: 08/05 0701 - 08/06 0700 In: 3108.1 [P.O.:360; I.V.:2469; IV Piggyback:279.1] Out: 9476 [Urine:1150; Drains:105] Intake/Output this shift: No intake/output data recorded.  PE: Gen: Alert, NAD, pleasant, cooperative Pulm:Rate andeffort normal Abd: Soft, ND, no TTP, no peritonitis. Perc chole drain in place  Skin: no rashes noted, warm and dry   Anti-infectives: Anti-infectives (From admission, onward)   Start      Dose/Rate Route Frequency Ordered Stop   05/04/19 1400  piperacillin-tazobactam (ZOSYN) IVPB 3.375 g     3.375 g 12.5 mL/hr over 240 Minutes Intravenous Every 8 hours 05/04/19 1020     05/03/19 2000  meropenem (MERREM) 1 g in sodium chloride 0.9 % 100 mL IVPB  Status:  Discontinued     1 g 200 mL/hr over 30 Minutes Intravenous Every 8 hours 05/03/19 1826 05/04/19 1018   05/03/19 1800  piperacillin-tazobactam (ZOSYN) IVPB 3.375 g     3.375 g 100 mL/hr over 30 Minutes Intravenous  Once 05/03/19 1753 05/03/19 1907      Lab Results:  Recent Labs    05/04/19 1530 05/05/19 0949 05/06/19 0409  WBC 3.7* 4.5  --   HGB 7.7* 7.6* 7.6*  HCT 23.2* 22.7* 23.4*  PLT 75* 76*  --    BMET Recent Labs    05/04/19 0449 05/04/19 1530 05/05/19 0949  NA 133*  --  135  K 3.8  --  3.7  CL 102  --  105  CO2 23  --  24  GLUCOSE 218*  --  180*  BUN 26*  --  17  CREATININE 1.07 0.93 0.96  CALCIUM 8.0*  --  7.7*   PT/INR No results for input(s): LABPROT, INR in the last 72 hours. CMP     Component Value Date/Time   NA 135 05/05/2019 0949   NA 141 05/14/2017 0809   K 3.7 05/05/2019 0949   CL 105 05/05/2019 0949   CO2 24 05/05/2019 0949   GLUCOSE 180 (H) 05/05/2019 0949   BUN 17 05/05/2019 0949   BUN 11 05/14/2017 0809   CREATININE 0.96 05/05/2019 0949   CREATININE 1.26 (H) 05/03/2019 1030  CREATININE 1.16 12/05/2011 1117   CALCIUM 7.7 (L) 05/05/2019 0949   PROT 5.4 (L) 05/05/2019 0949   ALBUMIN 2.3 (L) 05/05/2019 0949   AST 24 05/05/2019 0949   AST 16 05/03/2019 1030   ALT 26 05/05/2019 0949   ALT 17 05/03/2019 1030   ALKPHOS 46 05/05/2019 0949   BILITOT 0.6 05/05/2019 0949   BILITOT 0.6 05/03/2019 1030   GFRNONAA >60 05/05/2019 0949   GFRNONAA 57 (L) 05/03/2019 1030   GFRAA >60 05/05/2019 0949   GFRAA >60 05/03/2019 1030   Lipase     Component Value Date/Time   LIPASE 17 05/03/2019 1622    Studies/Results: Nm Hepatobiliary Liver Func  Result Date: 05/04/2019 CLINICAL  DATA:  RIGHT upper quadrant pain. Diagnosis cholangiocarcinoma April 2020. EXAM: NUCLEAR MEDICINE HEPATOBILIARY IMAGING TECHNIQUE: Sequential images of the abdomen were obtained out to 60 minutes following intravenous administration of radiopharmaceutical. RADIOPHARMACEUTICALS:  5.3 mCi Tc-54m Choletec IV (1.5 millicurie booster dose of Choletec) COMPARISON:  Ultrasound 05/03/2019, MRI 01/20/2019 FINDINGS: Prompt clearance of radiotracer from the blood pool and homogeneous uptake in liver. Counts are evident in the common bile duct and proximal small bowel by 15 minutes. The gallbladder is not fill over 60 minutes of imaging. 2.7 mg of IV morphine was administered to contract the sphincter of Oddi. The gallbladder fails to fill following administration of morphine. IMPRESSION: Non filling of the gallbladder suggest cystic duct obstruction. Recommend clinical correlation with acute cholecystitis. These results will be called to the ordering clinician or representative by the Radiologist Assistant, and communication documented in the PACS or zVision Dashboard. Electronically Signed   By: Suzy Bouchard M.D.   On: 05/04/2019 14:16   Ir Perc Cholecystostomy  Result Date: 05/04/2019 INDICATION: Cholecystitis, non operative candidate EXAM: ULTRASOUND FLUOROSCOPIC PERCUTANEOUS TRANSHEPATIC CHOLECYSTOSTOMY MEDICATIONS: Patient is currently receiving IV antibiotics as an inpatient ANESTHESIA/SEDATION: Moderate (conscious) sedation was employed during this procedure. A total of Versed 2.0 mg and Fentanyl 50 mcg was administered intravenously. Moderate Sedation Time: 15 minutes. The patient's level of consciousness and vital signs were monitored continuously by radiology nursing throughout the procedure under my direct supervision. FLUOROSCOPY TIME:  Fluoroscopy Time: 0 minutes 36 seconds (7 mGy). COMPLICATIONS: None immediate. PROCEDURE: Informed written consent was obtained from the patient after a thorough discussion  of the procedural risks, benefits and alternatives. All questions were addressed. Maximal Sterile Barrier Technique was utilized including caps, mask, sterile gowns, sterile gloves, sterile drape, hand hygiene and skin antiseptic. A timeout was performed prior to the initiation of the procedure. Previous imaging reviewed. Patient positioned supine. Preliminary ultrasound performed. The distended gallbladder with wall was localized in the right upper quadrant in the mid axillary line through a lower intercostal space. Overlying skin marked. Under sterile conditions and local anesthesia, a 21 gauge needle was advanced percutaneously a transhepatic approach into the gallbladder. Needle position confirmed with ultrasound. Images obtained for documentation. There was return of dark bile. Sample sent for culture. Guidewire inserted followed by the Accustick dilator set. Amplatz guidewire exchange followed by tract dilatation to insert a 10 [place] drain. Drain catheter position confirmed with fluoroscopy and contrast injection. 65 cc total bile aspirated. Catheter secured with Prolene suture and connected to external gravity drainage bag. Sterile dressing applied. No immediate complication. Patient tolerated the procedure well. IMPRESSION: Successful ultrasound and fluoroscopic percutaneous transhepatic cholecystostomy. Electronically Signed   By: Jerilynn Mages.  Shick M.D.   On: 05/04/2019 17:22      Kalman Drape , [facility] 05/06/2019, 9:16 AM  Pager: [phone] Mon-Wed, Friday 7:00am-4:30pm Thurs 7am-11:30am  Consults: [phone]

## 2019-05-06 NOTE — Care Management Important Message (Signed)
Important Message  Patient Details IM Letter given to Sharren Bridge SW to present to the Patient Name: Christopher Welch MRN: [id number] Date of Birth: Sep 11, 1946   Medicare Important Message Given:  Yes     Kerin Salen 05/06/2019, 10:09 AM

## 2019-05-07 ENCOUNTER — Other Ambulatory Visit: Payer: Self-pay | Admitting: Hematology

## 2019-05-07 DIAGNOSIS — D696 Thrombocytopenia, unspecified: Secondary | ICD-10-CM

## 2019-05-07 DIAGNOSIS — K819 Cholecystitis, unspecified: Secondary | ICD-10-CM

## 2019-05-07 LAB — GLUCOSE, CAPILLARY
Glucose-Capillary: 211 mg/dL — ABNORMAL HIGH (ref 70–99)
Glucose-Capillary: 231 mg/dL — ABNORMAL HIGH (ref 70–99)

## 2019-05-07 LAB — TYPE AND SCREEN
ABO/RH(D): A POS
Antibody Screen: NEGATIVE
Unit division: 0

## 2019-05-07 LAB — BPAM RBC
Blood Product Expiration Date: 202008252359
ISSUE DATE / TIME: 202008061235
Unit Type and Rh: 6200

## 2019-05-07 LAB — HEMOGLOBIN AND HEMATOCRIT, BLOOD
HCT: 28.5 % — ABNORMAL LOW (ref 39.0–52.0)
Hemoglobin: 9.5 g/dL — ABNORMAL LOW (ref 13.0–17.0)

## 2019-05-07 MED ORDER — SODIUM CHLORIDE (PF) 0.9 % IJ SOLN: 10.0000 mL | Freq: Two times a day (BID) | INTRAMUSCULAR | Status: AC

## 2019-05-07 MED ORDER — HEPARIN SOD (PORK) LOCK FLUSH 100 UNIT/ML IV SOLN
500.0000 [IU] | INTRAVENOUS | Status: AC | PRN
  Administered 2019-05-07: 500 [IU]

## 2019-05-07 MED ORDER — AMOXICILLIN-POT CLAVULANATE 875-125 MG PO TABS: 1.0000 | ORAL_TABLET | Freq: Two times a day (BID) | ORAL | 0 refills | Status: AC

## 2019-05-07 MED ORDER — INSULIN GLARGINE 100 UNIT/ML ~~LOC~~ SOLN
20.0000 [IU] | Freq: Every day | SUBCUTANEOUS | Status: DC
  Administered 2019-05-07: 13:00:00 20 [IU] via SUBCUTANEOUS
  Filled 2019-05-07: qty 0.2

## 2019-05-07 MED ORDER — OXYCODONE HCL 5 MG PO CAPS: 5.0000 mg | ORAL_CAPSULE | Freq: Four times a day (QID) | ORAL | 0 refills | Status: DC | PRN

## 2019-05-07 NOTE — Discharge Summary (Signed)
Physician Discharge Summary  Christopher Welch XKG:[id number] DOB: Jun 05, 1946 DOA: 05/03/2019  PCP: [name], DO  Admit date: 05/03/2019 Discharge date: 05/07/2019  Admitted From: Home Disposition: Home  Recommendations for Outpatient Follow-up:  1. Follow up with PCP/general surgery/oncology in a week 2. Please obtain CBC/BMP/Mag at follow up 3. Please follow up on the following pending results: None  Home Health: None Equipment/Devices: None  Discharge Condition: Stable CODE STATUS: Full code  Hospital Course: 73 year old male with history of IDDM-2, HTN, HLD, cholangiocarcinoma currently on chemo presented to Oncology office with severe RUQ pain.RUQ ultrasound revealed mildly distended gallbladder with large amount of echogenic sludge and moderate gallbladder wall thickening suspicious for cholecystitis, and admitted on 05/03/2019 for further care. He is status post 3 cycles of chemo, and currently on cisplatin, gemcitabine and Abraxane withUdenyca supportwhich wasstarted the day of admission.  In ED, mildly elevated temp.  WBC 6.1.  Hgb 8.8.  Platelet 122.  Started on IV meropenem.  General surgery consulted and recommended HIDA scan which was suspicious for cystic duct obstruction.  Patient had percutaneous cholecystostomy by IR on 05/04/2019.  Surgical tissue cultures grew pansensitive Klebsiella pneumonia except to ampicillin.  Patient was transitioned to Augmentin on 05/07/2019 and discharged on Augmentin for 5 more days.  Patient to flush percutaneous biliary drain daily until he is followed by general surgery.   See individual problem list below for more.  Discharge Diagnoses:  RUQ pain/acute cholecystitis: Improved. -RUQ [place] and HIDA concerning for cholecystitis with cystic duct blockage. -Deemed to be not a candidate for lap chole due to cholangiocarcinoma. -Percutaneous cholecystostomy by IR on 05/04/2019. -Surgical culture grew pansensitive Klebsiella pneumonia except to  ampicillin. -Meropenem 8/3-8/4 -Zosyn 8/4-8/7, and discharged on Augmentin for 5 more days. -Blood cultures negative so far. -Patient to be discharged with cholecystostomy and follow-up at [facility]  -Patient to continue daily flushing.  Fairly-controlled IDDM-2: A1c 7.5%.  CBG elevated. -Discharged on home insulin.  Essential hypertension: Normotensive -Discharged on home medications.  Cholangiocarcinoma -Will FYI his oncologist  Anemia of chronic disease/thrombocytopenia: Baseline Hgb 9-11.  -Hgb 8.8 (admit)>7.8>7.6> 1 unit> 9.5 -Thrombocytopenia at baseline. -Appreciate oncology input-outpatient follow-up.  Discharge Instructions  Discharge Instructions    Call MD for:  persistant dizziness or light-headedness   Complete by: As directed    Call MD for:  persistant nausea and vomiting   Complete by: As directed    Call MD for:  redness, tenderness, or signs of infection (pain, swelling, redness, odor or green/yellow discharge around incision site)   Complete by: As directed    Call MD for:  severe uncontrolled pain   Complete by: As directed    Call MD for:  temperature >100.4   Complete by: As directed    Diet - low sodium heart healthy   Complete by: As directed    Diet Carb Modified   Complete by: As directed    Discharge instructions   Complete by: As directed    It has been a pleasure taking care of you! You were admitted with cholecystitis (gallbladder infection) that was treated with drain placement on antibiotics.  We are discharging you on more antibiotic that you need to continue taking by mouth until you complete the treatment course.  We recommend flushing the percutaneous drain 1-2 times a day until you follow-up with primary care doctor, surgeon and oncologist.  Please review your new medication list and the directions before you take your medications.  Take care,   Increase  activity slowly   Complete by: As directed      Allergies as of 05/07/2019       Reactions   Erythromycin    Severe Abdominal pain      Medication List    TAKE these medications   Accu-Chek Nano SmartView w/Device Kit 1 kit by Does not apply route 2 (two) times daily. What changed:   when to take this  additional instructions   amLODipine 5 MG tablet Commonly known as: NORVASC Take 5 mg by mouth daily.   amoxicillin-clavulanate 875-125 MG tablet Commonly known as: Augmentin Take 1 tablet by mouth 2 (two) times daily for 5 days.   atenolol 25 MG tablet Commonly known as: TENORMIN Take by mouth daily. Once a day   atorvastatin 10 MG tablet Commonly known as: LIPITOR Take 10 mg by mouth daily.   glucose blood test strip Test 3 times a day. What changed:   how much to take  how to take this  when to take this  additional instructions   insulin NPH Human 100 UNIT/ML injection Commonly known as: NOVOLIN N Inject 20 Units into the skin 2 (two) times a day.   insulin regular 100 units/mL injection Commonly known as: NOVOLIN R Inject 0-40 Units into the skin 3 (three) times daily before meals. Sliding scale 0-40 units   lidocaine-prilocaine cream Commonly known as: EMLA Apply to affected area once   lisinopril 20 MG tablet Commonly known as: ZESTRIL Take 20 mg by mouth daily.   magnesium oxide 400 (241.3 Mg) MG tablet Commonly known as: MAG-OX Take 1 tablet (400 mg total) by mouth daily.   metFORMIN 1000 MG tablet Commonly known as: GLUCOPHAGE Take 2,000 mg by mouth every evening.   ondansetron 8 MG tablet Commonly known as: ZOFRAN TAKE 1 TABLET BY MOUTH 2 TIMES DAY AS NEEDED. START ON 3RD DAY AFTER CHEMOTHERAPY What changed: See the new instructions.   oxycodone 5 MG capsule Commonly known as: OXY-IR Take 1 capsule (5 mg total) by mouth every 6 (six) hours as needed for pain.   prochlorperazine 10 MG tablet Commonly known as: COMPAZINE TAKE 1 TABLET (10 MG TOTAL) BY MOUTH EVERY 6 (SIX) HOURS AS NEEDED (NAUSEA OR  VOMITING). What changed: reasons to take this   zolpidem 5 MG tablet Commonly known as: AMBIEN Take 1-2 tablets (5-10 mg total) by mouth at bedtime as needed for sleep.      Follow-up Information    Michael Boston, MD Follow up.   Specialty: General Surgery Why: as needed after radiology evaluates gallbladder drain to discuss possible removal of gallbladder Contact information: [place] [address] [phone]        [name], DO. Schedule an appointment as soon as possible for a visit in 1 week(s).   Specialty: Family Medicine Contact information: [address] [place] [address] [phone]           Consultations:  General surgery  IR  Procedures/Studies:  2D Echo: None  Nm Hepatobiliary Liver Func  Result Date: 05/04/2019 CLINICAL DATA:  RIGHT upper quadrant pain. Diagnosis cholangiocarcinoma April 2020. EXAM: NUCLEAR MEDICINE HEPATOBILIARY IMAGING TECHNIQUE: Sequential images of the abdomen were obtained out to 60 minutes following intravenous administration of radiopharmaceutical. RADIOPHARMACEUTICALS:  5.3 mCi Tc-15mCholetec IV (1.5 millicurie booster dose of Choletec) COMPARISON:  Ultrasound 05/03/2019, MRI 01/20/2019 FINDINGS: Prompt clearance of radiotracer from the blood pool and homogeneous uptake in liver. Counts are evident in  the common bile duct and proximal small bowel by 15 minutes. The gallbladder is not fill over 60 minutes of imaging. 2.7 mg of IV morphine was administered to contract the sphincter of Oddi. The gallbladder fails to fill following administration of morphine. IMPRESSION: Non filling of the gallbladder suggest cystic duct obstruction. Recommend clinical correlation with acute cholecystitis. These results will be called to the ordering clinician or representative by the Radiologist Assistant, and communication documented in the PACS or zVision Dashboard. Electronically Signed   By:  Suzy Bouchard M.D.   On: 05/04/2019 14:16   US Abdomen Limited  Result Date: 05/03/2019 CLINICAL DATA:  Right upper quadrant abdominal pain. Known cholangiocarcinoma. EXAM: ULTRASOUND ABDOMEN LIMITED RIGHT UPPER QUADRANT COMPARISON:  MRI abdomen 01/20/2019 FINDINGS: Gallbladder: Gallbladder is distended and contains a large amount of echogenic sludge. The gallbladder wall is slightly thickened measuring a maximum of 5 mm. No pericholecystic fluid. No sonographic Murphy sign. Common bile duct: Diameter: Difficult to identify and measure. No obvious dilatation. Patient has a plastic common bile duct stent in place. Liver: No intrahepatic biliary dilatation. No worrisome hepatic lesions. No perihepatic fluid collections. Portal vein is patent on color Doppler imaging with normal direction of blood flow towards the liver. Other: Slightly complex upper pole right renal cyst is noted. IMPRESSION: 1. Mildly distended gallbladder with large amount of echogenic sludge. There is also moderate gallbladder wall thickening. Could not exclude a calculus cholecystitis. 2. Difficult to identify and measure the common bile duct. Patient has a known plastic common bile duct stent in place and there is no obvious biliary dilatation. 3. No worrisome hepatic lesions. Electronically Signed   By: Marijo Sanes M.D.   On: 05/03/2019 13:28   Ir Perc Cholecystostomy  Result Date: 05/04/2019 INDICATION: Cholecystitis, non operative candidate EXAM: ULTRASOUND FLUOROSCOPIC PERCUTANEOUS TRANSHEPATIC CHOLECYSTOSTOMY MEDICATIONS: Patient is currently receiving IV antibiotics as an inpatient ANESTHESIA/SEDATION: Moderate (conscious) sedation was employed during this procedure. A total of Versed 2.0 mg and Fentanyl 50 mcg was administered intravenously. Moderate Sedation Time: 15 minutes. The patient's level of consciousness and vital signs were monitored continuously by radiology nursing throughout the procedure under my direct  supervision. FLUOROSCOPY TIME:  Fluoroscopy Time: 0 minutes 36 seconds (7 mGy). COMPLICATIONS: None immediate. PROCEDURE: Informed written consent was obtained from the patient after a thorough discussion of the procedural risks, benefits and alternatives. All questions were addressed. Maximal Sterile Barrier Technique was utilized including caps, mask, sterile gowns, sterile gloves, sterile drape, hand hygiene and skin antiseptic. A timeout was performed prior to the initiation of the procedure. Previous imaging reviewed. Patient positioned supine. Preliminary ultrasound performed. The distended gallbladder with wall was localized in the right upper quadrant in the mid axillary line through a lower intercostal space. Overlying skin marked. Under sterile conditions and local anesthesia, a 21 gauge needle was advanced percutaneously a transhepatic approach into the gallbladder. Needle position confirmed with ultrasound. Images obtained for documentation. There was return of dark bile. Sample sent for culture. Guidewire inserted followed by the Accustick dilator set. Amplatz guidewire exchange followed by tract dilatation to insert a 10 [place] drain. Drain catheter position confirmed with fluoroscopy and contrast injection. 65 cc total bile aspirated. Catheter secured with Prolene suture and connected to external gravity drainage bag. Sterile dressing applied. No immediate complication. Patient tolerated the procedure well. IMPRESSION: Successful ultrasound and fluoroscopic percutaneous transhepatic cholecystostomy. Electronically Signed   By: Jerilynn Mages.  Shick M.D.   On: 05/04/2019 17:22      Subjective:  No major events overnight of this morning.  No complaint this morning.  Denies chest pain, dyspnea, nausea, vomiting, abdominal pain or diarrhea.  Feels ready to go home.   Discharge Exam: Vitals:   05/06/19 1952 05/07/19 0549  BP: (!) 146/85 (!) 151/78  Pulse: 76 66  Resp: 18 18  Temp: 98.7 F (37.1 C) 97.7  F (36.5 C)  SpO2: 96% 98%    GENERAL: No acute distress.  Appears well.  HEENT: MMM.  Vision and hearing grossly intact.  NECK: Supple.  No JVD.  LUNGS:  No IWOB. Good air movement bilaterally. HEART:  RRR. Heart sounds normal.  ABD: Bowel sounds present. Soft. Non tender.  Nephrostomy tube MSK/EXT:  Moves all extremities. No apparent deformity. No edema bilaterally. SKIN: no apparent skin lesion or wound NEURO: Awake, alert and oriented appropriately.  No gross deficit.  PSYCH: Calm. Normal affect.     The results of significant diagnostics from this hospitalization (including imaging, microbiology, ancillary and laboratory) are listed below for reference.     Microbiology: Recent Results (from the past 240 hour(s))  Blood culture (routine x 2)     Status: None (Preliminary result)   Collection Time: 05/03/19  5:17 PM   Specimen: BLOOD  Result Value Ref Range Status   Specimen Description   Final    BLOOD RIGHT ANTECUBITAL Performed at [facility] [address], [place], [place] [address]    Special Requests   Final    BOTTLES DRAWN AEROBIC AND ANAEROBIC Blood Culture results may not be optimal due to an excessive volume of blood received in culture bottles Performed at [facility] [address], [place], [place] [address]    Culture   Final    NO GROWTH 4 DAYS Performed at [facility], [place] [address] [place], [place] [address]    Report Status PENDING  Incomplete  Blood culture (routine x 2)     Status: None (Preliminary result)   Collection Time: 05/03/19  5:17 PM   Specimen: BLOOD  Result Value Ref Range Status   Specimen Description   Final    BLOOD LEFT ANTECUBITAL Performed at [facility] [address], [place], [place] [address]    Special Requests   Final    BOTTLES DRAWN AEROBIC AND ANAEROBIC Blood Culture results may not be optimal due to an excessive volume of blood received in  culture bottles Performed at [facility] [address], [place], [place] [address]    Culture   Final    NO GROWTH 4 DAYS Performed at [facility], [place] [address] [place], [place] [address]    Report Status PENDING  Incomplete  SARS Coronavirus 2 [facility] order, Performed in [facility] hospital lab) Nasopharyngeal Nasopharyngeal Swab     Status: None   Collection Time: 05/03/19  9:12 PM   Specimen: Nasopharyngeal Swab  Result Value Ref Range Status   SARS Coronavirus 2 NEGATIVE NEGATIVE Final    Comment: (NOTE) If result is NEGATIVE SARS-CoV-2 target nucleic acids are NOT DETECTED. The SARS-CoV-2 RNA is generally detectable in upper and lower  respiratory specimens during the acute phase of infection. The lowest  concentration of SARS-CoV-2 viral copies this assay can detect is 250  copies / mL. A negative result does not preclude SARS-CoV-2 infection  and should not be used as the sole basis for treatment or other  patient management decisions.  A negative result  may occur with  improper specimen collection / handling, submission of specimen other  than nasopharyngeal swab, presence of viral mutation(s) within the  areas targeted by this assay, and inadequate number of viral copies  (<250 copies / mL). A negative result must be combined with clinical  observations, patient history, and epidemiological information. If result is POSITIVE SARS-CoV-2 target nucleic acids are DETECTED. The SARS-CoV-2 RNA is generally detectable in upper and lower  respiratory specimens dur ing the acute phase of infection.  Positive  results are indicative of active infection with SARS-CoV-2.  Clinical  correlation with patient history and other diagnostic information is  necessary to determine patient infection status.  Positive results do  not rule out bacterial infection or co-infection with other viruses. If result is PRESUMPTIVE POSTIVE SARS-CoV-2 nucleic acids  MAY BE PRESENT.   A presumptive positive result was obtained on the submitted specimen  and confirmed on repeat testing.  While 2019 novel coronavirus  (SARS-CoV-2) nucleic acids may be present in the submitted sample  additional confirmatory testing may be necessary for epidemiological  and / or clinical management purposes  to differentiate between  SARS-CoV-2 and other Sarbecovirus currently known to infect humans.  If clinically indicated additional testing with an alternate test  methodology [phone]) is advised. The SARS-CoV-2 RNA is generally  detectable in upper and lower respiratory sp ecimens during the acute  phase of infection. The expected result is Negative. Fact Sheet for Patients:  StrictlyIdeas.no Fact Sheet for Healthcare Providers: BankingDealers.co.za This test is not yet approved or cleared by the [place] FDA and has been authorized for detection and/or diagnosis of SARS-CoV-2 by FDA under an Emergency Use Authorization (EUA).  This EUA will remain in effect (meaning this test can be used) for the duration of the COVID-19 declaration under Section 564(b)(1) of the Act, 21 U.S.C. section 360bbb-3(b)(1), unless the authorization is terminated or revoked sooner. Performed at [facility], [place] [address] [place], [place] [address]   Aerobic/Anaerobic Culture (surgical/deep wound)     Status: None (Preliminary result)   Collection Time: 05/04/19  5:17 PM   Specimen: Gallbladder; Abscess  Result Value Ref Range Status   Specimen Description   Final    GALL BLADDER Performed at [facility] [address], [place], [place] [address]    Special Requests   Final    Normal Performed at [facility], [place] [address], [place], [place] [address]    Gram Stain   Final    NO WBC SEEN FEW GRAM NEGATIVE RODS FEW GRAM VARIABLE ROD RARE GRAM POSITIVE  COCCI Performed at [facility], [place] [address], [place], [place] [address]    Culture   Final    ABUNDANT KLEBSIELLA PNEUMONIAE NO ANAEROBES ISOLATED; CULTURE IN PROGRESS FOR 5 DAYS    Report Status PENDING  Incomplete   Organism ID, Bacteria KLEBSIELLA PNEUMONIAE  Final      Susceptibility   Klebsiella pneumoniae - MIC*    AMPICILLIN >=32 RESISTANT Resistant     CEFAZOLIN <=4 SENSITIVE Sensitive     CEFEPIME <=1 SENSITIVE Sensitive     CEFTAZIDIME <=1 SENSITIVE Sensitive     CEFTRIAXONE <=1 SENSITIVE Sensitive     CIPROFLOXACIN <=0.25 SENSITIVE Sensitive     GENTAMICIN <=1 SENSITIVE Sensitive     IMIPENEM <=0.25 SENSITIVE Sensitive     TRIMETH/SULFA <=20 SENSITIVE Sensitive     AMPICILLIN/SULBACTAM 8 SENSITIVE Sensitive     PIP/TAZO 16  SENSITIVE Sensitive     Extended ESBL NEGATIVE Sensitive     * ABUNDANT KLEBSIELLA PNEUMONIAE     Labs: BNP (last 3 results) No results for input(s): BNP in the last 8760 hours. Basic Metabolic Panel: Recent Labs  Lab 05/03/19 1030 05/03/19 1622 05/04/19 0449 05/04/19 1530 05/05/19 0949  NA 134* 133* 133*  --  135  K 5.4* 4.2 3.8  --  3.7  CL 99 99 102  --  105  CO2 _0 --  24  GLUCOSE 155* 253* 218*  --  180*  BUN 30* 30* 26*  --  17  CREATININE 1.26* 1.09 1.07 0.93 0.96  CALCIUM 9.6 8.8* 8.0*  --  7.7*  MG 1.9  --   --   --  1.8   Liver Function Tests: Recent Labs  Lab 05/03/19 1030 05/03/19 1622 05/04/19 0449 05/05/19 0949  AST _1 ALT _2 ALKPHOS 65 59 50 46  BILITOT 0.6 1.0 0.9 0.6  PROT 6.9 6.8 5.9* 5.4*  ALBUMIN 3.1* 3.4* 2.8* 2.3*   Recent Labs  Lab 05/03/19 1622  LIPASE 17   No results for input(s): AMMONIA in the last 168 hours. CBC: Recent Labs  Lab 05/03/19 1030  05/03/19 1622 05/04/19 0449 05/04/19 1530 05/05/19 0949 05/06/19 0409 05/06/19 1949 05/07/19 0317  WBC 7.1  --  6.1 3.9* 3.7* 4.5  --   --   --   NEUTROABS 5.8  --  5.1  --   --   --   --   --   --   HGB  9.7*   < > 8.8* 7.8* 7.7* 7.6* 7.6* 9.1* 9.5*  HCT 29.2*  --  26.9* 23.5* 23.2* 22.7* 23.4* 27.2* 28.5*  MCV 92.1  --  92.8 93.6 93.9 92.7  --   --   --   PLT 156  --  122* 86* 75* 76*  --   --   --    < > = values in this interval not displayed.   Cardiac Enzymes: No results for input(s): CKTOTAL, CKMB, CKMBINDEX, TROPONINI in the last 168 hours. BNP: Invalid input(s): POCBNP CBG: Recent Labs  Lab 05/06/19 0744 05/06/19 1205 05/06/19 1645 05/06/19 2030 05/07/19 0742  GLUCAP 199* 256* 274* 248* 211*   D-Dimer No results for input(s): DDIMER in the last 72 hours. Hgb A1c Recent Labs    05/05/19 0949  HGBA1C 7.5*   Lipid Profile No results for input(s): CHOL, HDL, LDLCALC, TRIG, CHOLHDL, LDLDIRECT in the last 72 hours. Thyroid function studies No results for input(s): TSH, T4TOTAL, T3FREE, THYROIDAB in the last 72 hours.  Invalid input(s): FREET3 Anemia work up No results for input(s): VITAMINB12, FOLATE, FERRITIN, TIBC, IRON, RETICCTPCT in the last 72 hours. Urinalysis No results found for: COLORURINE, APPEARANCEUR, LABSPEC, PHURINE, GLUCOSEU, HGBUR, BILIRUBINUR, KETONESUR, PROTEINUR, UROBILINOGEN, NITRITE, LEUKOCYTESUR Sepsis Labs Invalid input(s): PROCALCITONIN,  WBC,  LACTICIDVEN   Time coordinating discharge: 35 minutes  SIGNED:  Mercy Riding, MD  [facility] 05/07/2019, 11:01 AM  If 7PM-7AM, please contact night-coverage [email or web address] Password [email or web address]

## 2019-05-07 NOTE — Progress Notes (Signed)
Referring Physician(s): Jackson Latino, PA-C  Supervising Physician: Corrie Mckusick  Patient Status:  [facility] - In-pt  Chief Complaint: Follow up cholecystostomy placed 05/03/28 by Dr. Annamaria Boots  Subjective:  Patient laying in bed, appears comfortable. He reports some soreness at drain insertion site but otherwise feels ok. He has been eating and drinking. He is hopeful he can go home soon.   Allergies: Erythromycin  Medications: Prior to Admission medications   Medication Sig Start Date End Date Taking? Authorizing Provider  amLODipine (NORVASC) 5 MG tablet Take 5 mg by mouth daily.    Yes [provider]  atenolol (TENORMIN) 25 MG tablet Take by mouth daily. Once a day   Yes [provider]  atorvastatin (LIPITOR) 10 MG tablet Take 10 mg by mouth daily. 05/13/17  Yes [provider]  insulin NPH Human (NOVOLIN N) 100 UNIT/ML injection Inject 20 Units into the skin 2 (two) times a day.    Yes [provider]  insulin regular (NOVOLIN R) 100 units/mL injection Inject 0-40 Units into the skin 3 (three) times daily before meals. Sliding scale 0-40 units   Yes [provider]  lisinopril (ZESTRIL) 20 MG tablet Take 20 mg by mouth daily.  03/14/17  Yes [provider]  magnesium oxide (MAG-OX) 400 (241.3 Mg) MG tablet Take 1 tablet (400 mg total) by mouth daily. 03/12/19  Yes [name], MD  metFORMIN (GLUCOPHAGE) 1000 MG tablet Take 2,000 mg by mouth every evening.    Yes [provider]  ondansetron (ZOFRAN) 8 MG tablet TAKE 1 TABLET BY MOUTH 2 TIMES DAY AS NEEDED. START ON 3RD DAY AFTER CHEMOTHERAPY Patient taking differently: Take 8 mg by mouth 2 (two) times daily as needed. Start on 3rd day after chemotherapy 04/19/19  Yes [name], MD  oxycodone (OXY-IR) 5 MG capsule Take 1 capsule (5 mg total) by mouth every 6 (six) hours as needed for pain. 04/08/19  Yes Alla Feeling, NP  prochlorperazine (COMPAZINE) 10 MG tablet TAKE 1 TABLET (10  MG TOTAL) BY MOUTH EVERY 6 (SIX) HOURS AS NEEDED (NAUSEA OR VOMITING). Patient taking differently: Take 10 mg by mouth every 6 (six) hours as needed for nausea or vomiting (Nausea or vomiting).  04/19/19  Yes [name], MD  zolpidem (AMBIEN) 5 MG tablet Take 1-2 tablets (5-10 mg total) by mouth at bedtime as needed for sleep. 04/01/19  Yes [name], MD  Blood Glucose Monitoring Suppl (ACCU-CHEK NANO SMARTVIEW) W/DEVICE KIT 1 kit by Does not apply route 2 (two) times daily. Patient taking differently: 1 kit by Does not apply route See admin instructions. Test blood sugars 12x's daily 10/23/12   Denita Lung, MD  glucose blood test strip Test 3 times a day. Patient taking differently: 1 each by Other route See admin instructions. Test 12 times a day. 10/26/12   Denita Lung, MD  lidocaine-prilocaine (EMLA) cream Apply to affected area once Patient not taking: Reported on 05/03/2019 03/08/19   [name], MD  citalopram (CELEXA) 20 MG tablet Take 1 tablet (20 mg total) by mouth daily. 02/26/11 12/05/11  Denita Lung, MD  clonazePAM (KLONOPIN) 0.5 MG tablet Take 0.5 mg by mouth 2 (two) times daily as needed.    12/05/11  [provider]  lamoTRIgine (LAMICTAL) 100 MG tablet Take 100 mg by mouth daily. 1/2 TABLET QD   12/05/11  [provider]     Vital Signs: BP (!) 151/78 (BP Location: Left Arm)  Pulse 66    Temp 97.7 F (36.5 C) (Oral)    Resp 18    Ht 5' 7" (1.702 m)    Wt 146 lb 3.2 oz (66.3 kg)    SpO2 98%    BMI 22.90 kg/m   Physical Exam Vitals signs and nursing note reviewed.  Constitutional:      General: He is not in acute distress. HENT:     Head: Normocephalic.  Cardiovascular:     Rate and Rhythm: Normal rate.  Pulmonary:     Effort: Pulmonary effort is normal.  Abdominal:     General: There is no distension.     Palpations: Abdomen is soft.     Tenderness: There is abdominal tenderness (mild over drain insertion site).     Comments: (+) RUQ drain to gravity  with ~50 cc bilious output. Insertion site clean, dry, dressed appropriately. Stat lock and suture in tact.  Skin:    General: Skin is warm and dry.  Neurological:     Mental Status: He is alert. Mental status is at baseline.     Imaging: Nm Hepatobiliary Liver Func  Result Date: 05/04/2019 CLINICAL DATA:  RIGHT upper quadrant pain. Diagnosis cholangiocarcinoma April 2020. EXAM: NUCLEAR MEDICINE HEPATOBILIARY IMAGING TECHNIQUE: Sequential images of the abdomen were obtained out to 60 minutes following intravenous administration of radiopharmaceutical. RADIOPHARMACEUTICALS:  5.3 mCi Tc-58mCholetec IV (1.5 millicurie booster dose of Choletec) COMPARISON:  Ultrasound 05/03/2019, MRI 01/20/2019 FINDINGS: Prompt clearance of radiotracer from the blood pool and homogeneous uptake in liver. Counts are evident in the common bile duct and proximal small bowel by 15 minutes. The gallbladder is not fill over 60 minutes of imaging. 2.7 mg of IV morphine was administered to contract the sphincter of Oddi. The gallbladder fails to fill following administration of morphine. IMPRESSION: Non filling of the gallbladder suggest cystic duct obstruction. Recommend clinical correlation with acute cholecystitis. These results will be called to the ordering clinician or representative by the Radiologist Assistant, and communication documented in the PACS or zVision Dashboard. Electronically Signed   By: SSuzy BouchardM.D.   On: 05/04/2019 14:16   [place]Abdomen Limited  Result Date: 05/03/2019 CLINICAL DATA:  Right upper quadrant abdominal pain. Known cholangiocarcinoma. EXAM: ULTRASOUND ABDOMEN LIMITED RIGHT UPPER QUADRANT COMPARISON:  MRI abdomen 01/20/2019 FINDINGS: Gallbladder: Gallbladder is distended and contains a large amount of echogenic sludge. The gallbladder wall is slightly thickened measuring a maximum of 5 mm. No pericholecystic fluid. No sonographic Murphy sign. Common bile duct: Diameter: Difficult to identify  and measure. No obvious dilatation. Patient has a plastic common bile duct stent in place. Liver: No intrahepatic biliary dilatation. No worrisome hepatic lesions. No perihepatic fluid collections. Portal vein is patent on color Doppler imaging with normal direction of blood flow towards the liver. Other: Slightly complex upper pole right renal cyst is noted. IMPRESSION: 1. Mildly distended gallbladder with large amount of echogenic sludge. There is also moderate gallbladder wall thickening. Could not exclude a calculus cholecystitis. 2. Difficult to identify and measure the common bile duct. Patient has a known plastic common bile duct stent in place and there is no obvious biliary dilatation. 3. No worrisome hepatic lesions. Electronically Signed   By: PMarijo SanesM.D.   On: 05/03/2019 13:28   Ir Perc Cholecystostomy  Result Date: 05/04/2019 INDICATION: Cholecystitis, non operative candidate EXAM: ULTRASOUND FLUOROSCOPIC PERCUTANEOUS TRANSHEPATIC CHOLECYSTOSTOMY MEDICATIONS: Patient is currently receiving IV antibiotics as an inpatient ANESTHESIA/SEDATION: Moderate (conscious) sedation was employed during  this procedure. A total of Versed 2.0 mg and Fentanyl 50 mcg was administered intravenously. Moderate Sedation Time: 15 minutes. The patient's level of consciousness and vital signs were monitored continuously by radiology nursing throughout the procedure under my direct supervision. FLUOROSCOPY TIME:  Fluoroscopy Time: 0 minutes 36 seconds (7 mGy). COMPLICATIONS: None immediate. PROCEDURE: Informed written consent was obtained from the patient after a thorough discussion of the procedural risks, benefits and alternatives. All questions were addressed. Maximal Sterile Barrier Technique was utilized including caps, mask, sterile gowns, sterile gloves, sterile drape, hand hygiene and skin antiseptic. A timeout was performed prior to the initiation of the procedure. Previous imaging reviewed. Patient positioned  supine. Preliminary ultrasound performed. The distended gallbladder with wall was localized in the right upper quadrant in the mid axillary line through a lower intercostal space. Overlying skin marked. Under sterile conditions and local anesthesia, a 21 gauge needle was advanced percutaneously a transhepatic approach into the gallbladder. Needle position confirmed with ultrasound. Images obtained for documentation. There was return of dark bile. Sample sent for culture. Guidewire inserted followed by the Accustick dilator set. Amplatz guidewire exchange followed by tract dilatation to insert a 10 [place] drain. Drain catheter position confirmed with fluoroscopy and contrast injection. 65 cc total bile aspirated. Catheter secured with Prolene suture and connected to external gravity drainage bag. Sterile dressing applied. No immediate complication. Patient tolerated the procedure well. IMPRESSION: Successful ultrasound and fluoroscopic percutaneous transhepatic cholecystostomy. Electronically Signed   By: Jerilynn Mages.  Shick M.D.   On: 05/04/2019 17:22    Labs:  CBC: Recent Labs    05/03/19 1622 05/04/19 0449 05/04/19 1530 05/05/19 0949 05/06/19 0409 05/06/19 1949 05/07/19 0317  WBC 6.1 3.9* 3.7* 4.5  --   --   --   HGB 8.8* 7.8* 7.7* 7.6* 7.6* 9.1* 9.5*  HCT 26.9* 23.5* 23.2* 22.7* 23.4* 27.2* 28.5*  PLT 122* 86* 75* 76*  --   --   --     COAGS: Recent Labs    03/08/19 1254  INR 1.0    BMP: Recent Labs    05/03/19 1030 05/03/19 1622 05/04/19 0449 05/04/19 1530 05/05/19 0949  NA 134* 133* 133*  --  135  K 5.4* 4.2 3.8  --  3.7  CL 99 99 102  --  105  CO2 _0 --  24  GLUCOSE 155* 253* 218*  --  180*  BUN 30* 30* 26*  --  17  CALCIUM 9.6 8.8* 8.0*  --  7.7*  CREATININE 1.26* 1.09 1.07 0.93 0.96  GFRNONAA 57* >60 >60 >60 >60  GFRAA >60 >60 >60 >60 >60    LIVER FUNCTION TESTS: Recent Labs    05/03/19 1030 05/03/19 1622 05/04/19 0449 05/05/19 0949  BILITOT 0.6 1.0 0.9 0.6   AST _1 ALT _2 ALKPHOS 65 59 50 46  PROT 6.9 6.8 5.9* 5.4*  ALBUMIN 3.1* 3.4* 2.8* 2.3*    Assessment and Plan:  73 y/o M s/p percutaneous cholecystostomy tube placement 8/4 due to cholecystitis. Drain site unremarkable, per I/O 40 cc output in last 24H, ~50 cc in gravity bag on my exam today.  Hgb improved to 9.5 after infusion. He remains afebrile, preliminary culture (+) for Klebsiella pneumoniae - currently on Zosyn per primary team.   Continue current drain management - TID flushes with 3-5 cc NS, record output once daily, dressing changes PRN.  IR will continue to follow - please call  with questions or concerns.  Electronically Signed: Joaquim Nam, PA-C 05/07/2019, 10:10 AM   I spent a total of 15 Minutes at the the patient's bedside AND on the patient's [facility] floor or unit, greater than 50% of which was counseling/coordinating care for percutaneous cholecystostomy follow up.

## 2019-05-08 LAB — CULTURE, BLOOD (ROUTINE X 2)
Culture: NO GROWTH
Culture: NO GROWTH

## 2019-05-09 LAB — AEROBIC/ANAEROBIC CULTURE W GRAM STAIN (SURGICAL/DEEP WOUND)
Gram Stain: NONE SEEN
Special Requests: NORMAL

## 2019-05-10 NOTE — Progress Notes (Signed)
Christopher Welch   [phone] [phone]   Clinic Follow up Note   Patient Care Team: [name], DO as PCP - General (Family Medicine)  Date of Service:  05/13/2019  CHIEF COMPLAINT: F/u of ExtrahepaticCholangiocarcinoma and cholecystitis  SUMMARY OF ONCOLOGIC HISTORY: Oncology History  Cholangiocarcinoma ([place])  01/14/2019 Imaging   US Abdomen 01/14/19  IMPRESSION: There is significant intrahepatic and extrahepatic biliary dilatation present, concerning for distal common bile duct obstruction. Further evaluation with CT scan or MRCP is recommended. Correlation with liver function tests is recommended as well. These results will be called to the ordering clinician or representative by the Radiologist Assistant, and communication documented in the PACS or zVision Dashboard.   Probable large amount of sludge seen within gallbladder lumen with mild gallbladder wall thickening. However, no cholelithiasis, pericholecystic fluid or sonographic Murphy's sign is noted.   4.2 cm septated cyst seen in upper pole of right kidney consistent with Bosniak type 2 lesion. Follow-up ultrasound in 1 year is recommended to ensure stability.   01/20/2019 Imaging   MRI abdomen 01/20/19  IMPRESSION: 1. Findings are highly concerning for central tumor in the biliary tract at the confluence of the common hepatic duct, cystic duct and common bile ducts. Further clinical evaluation for potential cholangiocarcinoma is strongly recommended. 2. Mild ductal dilatation of the main pancreatic duct throughout the distal body and tail of the pancreas where there is also some associated side branch ectasia. This may suggest a pancreatic ductal stricture. No obstructing pancreatic neoplasm identified. 3. Aortic atherosclerosis.   01/21/2019 Initial Biopsy   Diagnosis 01/21/19  BILE DUCT BRUSHING (SPECIMEN 1 OF 1 COLLECTED 01/21/2019) ADENOCARCINOMA.   01/21/2019 Procedure    ERCP By Dr hung 01/21/19 IMRPESSION - The major papilla appeared normal. - A single localized biliary stricture was found in the middle third of the main bile duct. - The entire main bile duct and upper third of the main bile duct were dilated, secondary to a stricture. - A biliary sphincterotomy was performed. - Cells for cytology obtained in the middle third of the main bile duct. - One plastic stent was placed into the common bile duct.  EUS by Dr hung 01/21/19  IMPRESSION - There was dilation in the middle third of the main bile duct and in the upper third of the main bile duct which measured up to 15 mm. - There was a suggestion of a stricture in the middle third of the main bile duct. - No specimens collected.   02/03/2019 Imaging   [facility] at [facility] 02/03/19  Impression:  1. There is mild intrahepatic and extrahepatic biliary ductal dilation and diffuse common bile duct wall thickening with stent in place. There is abnormal soft tissue measuring approximately 1.6 cm between the common hepatic artery, portal vein and common bile duct, worrisome for the known cholangiocarcinoma.  2. Periportal lymphadenopathy which may represent nodal metastasis.  4. Marked pancreatic atrophy and duct dilation involving the distal body and tail the pancreas where there is a coarse calcification. These findings are favored to represent stenosis from intraductal stones though an underlying stricture cannot be completely excluded.  5. Atypical symmetric prominent fat stranding of the bilateral lower abdominal wall. Correlate with surgical history or history of history of trauma.   03/08/2019 Initial Diagnosis   Cholangiocarcinoma ([place])   03/12/2019 -  Chemotherapy   gemcitabine and cisplatin on days 1 and 8 every 21 days starting 03/12/19. Abraxane added with cycle 2. Added GCSF (/  [name]) to day 9 starting cycle 2.    03/19/2019 Cancer Staging   Staging form: Perihilar Bile Ducts, AJCC 8th Edition  - Clinical: Stage Unknown (cTX, cN0, cM0) - Signed by [name], MD on 03/19/2019      CURRENT THERAPY:  gemcitabine and cisplatin on days 1 and 8 every 21 daysstarting 03/12/19. Abraxane added with cycle 2. Added GCSF ([name]) to day 9 starting cycle 2.  INTERVAL HISTORY:  Christopher Welch is here for a follow up and treatment. He was hospitalized on 05/03/19 for cholecystitis. He notes last night he did not have enough syringes to flush his tubes. He called the surgeon but was sent the wrong items and is waiting on them. He has been flushing it twice a day. He notes his Left abdominal draining tube leaking moderately. He denies fever or chills. He has occasional pain at draining sites. He takes oxycodone only as needed. He notes his appetite is down and he is depressed about his drains and condition. He eats 1 carnation instant in the morning and 1 boost in the evening.   REVIEW OF SYSTEMS:   Constitutional: Denies fevers, chills or abnormal weight loss Eyes: Denies blurriness of vision Ears, nose, mouth, throat, and face: Denies mucositis or sore throat Respiratory: Denies cough, dyspnea or wheezes Cardiovascular: Denies palpitation, chest discomfort or lower extremity swelling Gastrointestinal:  Denies nausea, heartburn or change in bowel habits (+)  Left abdominal draining tube leaking moderately with pinkish discharge Skin: Denies abnormal skin rashes Lymphatics: Denies new lymphadenopathy or easy bruising Neurological:Denies numbness, tingling or new weaknesses Behavioral/Psych: Mood is stable, no new changes  All other systems were reviewed with the patient and are negative.  MEDICAL HISTORY:  Past Medical History:  Diagnosis Date  . Diabetes mellitus type I, controlled ([place])   . Dyslipidemia   . ED (erectile dysfunction)   . Essential hypertension   . Gilbert's disease   . Hepatitis B carrier [facility])     SURGICAL HISTORY: Past Surgical History:  Procedure Laterality Date  .  APPENDECTOMY  1964  . BILIARY BRUSHING  01/21/2019   Procedure: BILIARY BRUSHING;  Surgeon: Carol Ada, MD;  Location: [facility] ENDOSCOPY;  Service: Endoscopy;;  . BILIARY STENT PLACEMENT N/A 01/21/2019   Procedure: BILIARY STENT PLACEMENT;  Surgeon: Carol Ada, MD;  Location: [facility] ENDOSCOPY;  Service: Endoscopy;  Laterality: N/A;  . ENDOSCOPIC RETROGRADE CHOLANGIOPANCREATOGRAPHY (ERCP) WITH PROPOFOL N/A 01/21/2019   Procedure: ENDOSCOPIC RETROGRADE CHOLANGIOPANCREATOGRAPHY (ERCP) WITH PROPOFOL;  Surgeon: Carol Ada, MD;  Location: [facility];  Service: Endoscopy;  Laterality: N/A;  . ESOPHAGOGASTRODUODENOSCOPY (EGD) WITH PROPOFOL N/A 01/21/2019   Procedure: ESOPHAGOGASTRODUODENOSCOPY (EGD) WITH PROPOFOL;  Surgeon: Carol Ada, MD;  Location: [facility];  Service: Endoscopy;  Laterality: N/A;  . FEMORAL HERNIA REPAIR    . IR IMAGING GUIDED PORT INSERTION  03/08/2019  . IR PERC CHOLECYSTOSTOMY  05/04/2019  . LEFT HEART CATH AND CORONARY ANGIOGRAPHY N/A 05/19/2017   Procedure: LEFT HEART CATH AND CORONARY ANGIOGRAPHY;  Surgeon: Leonie Man, MD;  Location: [place] [facility] LAB;  Service: Cardiovascular;  Laterality: N/A;  . SPHINCTEROTOMY  01/21/2019   Procedure: SPHINCTEROTOMY;  Surgeon: Carol Ada, MD;  Location: [facility];  Service: Endoscopy;;  . UPPER ESOPHAGEAL ENDOSCOPIC ULTRASOUND (EUS) N/A 01/21/2019   Procedure: UPPER ESOPHAGEAL ENDOSCOPIC ULTRASOUND (EUS);  Surgeon: Carol Ada, MD;  Location: [facility];  Service: Endoscopy;  Laterality: N/A;    I have reviewed the social history and family history with the patient and  they are unchanged from previous note.  ALLERGIES:  is allergic to erythromycin.  MEDICATIONS:  Current Outpatient Medications  Medication Sig Dispense Refill  . amLODipine (NORVASC) 5 MG tablet Take 5 mg by mouth daily.     Marland Kitchen atenolol (TENORMIN) 25 MG tablet Take by mouth daily. Once a day    . atorvastatin (LIPITOR) 10 MG tablet Take 10 mg by mouth  daily.    . Blood Glucose Monitoring Suppl (ACCU-CHEK NANO SMARTVIEW) W/DEVICE KIT 1 kit by Does not apply route 2 (two) times daily. (Patient taking differently: 1 kit by Does not apply route See admin instructions. Test blood sugars 12x's daily) 1 kit 0  . glucose blood test strip Test 3 times a day. (Patient taking differently: 1 each by Other route See admin instructions. Test 12 times a day.) 300 each Prn  . insulin NPH Human (NOVOLIN N) 100 UNIT/ML injection Inject 20 Units into the skin 2 (two) times a day.     . insulin regular (NOVOLIN R) 100 units/mL injection Inject 0-40 Units into the skin 3 (three) times daily before meals. Sliding scale 0-40 units    . lidocaine-prilocaine (EMLA) cream Apply to affected area once 30 g 3  . lisinopril (ZESTRIL) 20 MG tablet Take 20 mg by mouth daily.     . magnesium oxide (MAG-OX) 400 (241.3 Mg) MG tablet Take 1 tablet (400 mg total) by mouth daily. 30 tablet 2  . metFORMIN (GLUCOPHAGE) 1000 MG tablet Take 2,000 mg by mouth every evening.     . ondansetron (ZOFRAN) 8 MG tablet TAKE 1 TABLET BY MOUTH 2 TIMES DAY AS NEEDED. START ON 3RD DAY AFTER CHEMOTHERAPY (Patient taking differently: Take 8 mg by mouth 2 (two) times daily as needed. Start on 3rd day after chemotherapy) 30 tablet 1  . oxycodone (OXY-IR) 5 MG capsule Take 1 capsule (5 mg total) by mouth every 6 (six) hours as needed for pain. 60 capsule 0  . prochlorperazine (COMPAZINE) 10 MG tablet TAKE 1 TABLET (10 MG TOTAL) BY MOUTH EVERY 6 (SIX) HOURS AS NEEDED (NAUSEA OR VOMITING). (Patient taking differently: Take 10 mg by mouth every 6 (six) hours as needed for nausea or vomiting (Nausea or vomiting). ) 30 tablet 1  . zolpidem (AMBIEN) 5 MG tablet Take 1-2 tablets (5-10 mg total) by mouth at bedtime as needed for sleep. 60 tablet 0   Current Facility-Administered Medications  Medication Dose Route Frequency Provider Last Rate Last Dose  . sodium chloride (PF) 0.9 % injection 10 mL  10 mL  Intravenous Q12H Gonfa, Taye T, MD        PHYSICAL EXAMINATION: ECOG PERFORMANCE STATUS: 1 - Symptomatic but completely ambulatory  Vitals:   05/13/19 0929  BP: 130/73  Pulse: 77  Resp: 18  Temp: 97.8 F (36.6 C)  SpO2: 100%   Filed Weights   05/13/19 0929  Weight: 148 lb 3.2 oz (67.2 kg)    GENERAL:alert, no distress and comfortable SKIN: skin color, texture, turgor are normal, no rashes or significant lesions EYES: normal, Conjunctiva are pink and non-injected, sclera clear  NECK: supple, thyroid normal size, non-tender, without nodularity LYMPH:  no palpable lymphadenopathy in the cervical, axillary  LUNGS: clear to auscultation and percussion with normal breathing effort HEART: regular rate & rhythm and no murmurs and no lower extremity edema ABDOMEN:abdomen soft, non-tender and normal bowel sounds (+) Left abdominal draining tube leaking moderately with pinkish discharge Musculoskeletal:no cyanosis of digits and no clubbing  NEURO: alert &  oriented x 3 with fluent speech, no focal motor/sensory deficits  LABORATORY DATA:  I have reviewed the data as listed CBC Latest Ref Rng & Units 05/13/2019 05/07/2019 05/06/2019  WBC 4.0 - 10.5 K/uL 7.5 - -  Hemoglobin 13.0 - 17.0 g/dL 10.1(L) 9.5(L) 9.1(L)  Hematocrit 39.0 - 52.0 % 30.8(L) 28.5(L) 27.2(L)  Platelets 150 - 400 K/uL 528(H) - -     CMP Latest Ref Rng & Units 05/05/2019 05/04/2019 05/04/2019  Glucose 70 - 99 mg/dL 180(H) - 218(H)  BUN 8 - 23 mg/dL 17 - 26(H)  Creatinine 0.61 - 1.24 mg/dL 0.96 0.93 1.07  Sodium 135 - 145 mmol/L 135 - 133(L)  Potassium 3.5 - 5.1 mmol/L 3.7 - 3.8  Chloride 98 - 111 mmol/L 105 - 102  CO2 22 - 32 mmol/L 24 - 23  Calcium 8.9 - 10.3 mg/dL 7.7(L) - 8.0(L)  Total Protein 6.5 - 8.1 g/dL 5.4(L) - 5.9(L)  Total Bilirubin 0.3 - 1.2 mg/dL 0.6 - 0.9  Alkaline Phos 38 - 126 U/L 46 - 50  AST 15 - 41 U/L 24 - 19  ALT 0 - 44 U/L 26 - 20      RADIOGRAPHIC STUDIES: I have personally reviewed the  radiological images as listed and agreed with the findings in the report. No results found.   ASSESSMENT & PLAN:  Christopher Welch is a 73 y.o. male with   1. Extrahepatic cholangiocarcinoma -He was recentlydiagnosedin 12/2018. His work up revealed malignant stricture and CBD obstruction required stenting. Brushing revealed adenocarcinoma. His extrahepatic cholangiocarcinoma was not resectable due to vascular invasion(surgeon - Dr. Zenia Resides at [facility]) -He was referred to our clinic to initiate chemotherapy in the neoadjuvant setting. -He proceeded with neoadjuvant gemcitabine and cisplatin on days 1 and 8 every 21 daysstarting 03/12/19. Abraxane added with cycle 2. Added GCSF ([name]) to day 9 starting cycle 2. -He has been tolerating chemo very well so far with no significant issues. He tolerated [name] with mild hip pain, he will continue to use Claritin.  -His tumor marker CA 19.9 has dropped significantly as he started chemo, indicating good response to chemo treatment -He plans to have second opinion at [facility] with medical Oncologist Dr. Jess Barters and anticipate to see surgeon Dr. Carlis Abbott or Dr. Crisoforo Oxford.  -Since draining tubes placed, his abdominal pain is much improved, weight maintained after draining tubes placed. He is interested in proceeding with treatment.  -Labs reviewed, CBC WNL except Hg 10.1, PLT 528K, CMP and Mag are still pending. Overall adequate to proceed with C4D1Gemcitabine/Cisplatin/Abraxane today with dose reduction of abraxane.  -Plan to scan him after cycle 4 at [facility] and see Dr. Carlis Abbott   -If his cancer is still not resectable which will be decided by Dr. Carlis Abbott, we plan to continue chemo  -F/u in 3 weeks before cycle 5  2. Acute cholecystitis -He was hospitalized on 05/03/19.  -He had a percutaneous gallbladder drainage tube placed by IR 05/05/19. -He was also treated with antibiotics.  -Abdominal pain much improved.  -Since yesterday his draining tube has been leaking moderately  pinkish discharge and with little output into bag. I will contact IR today to evaluate him.  -He did run out of flushing syringes as well, will call in to [facility].   3. Weight loss and low appetite  -secondary to malignancy and chemo  -he presented with 20 lbs weight loss  -We reviewed nutrition support withGlucerna -Continue to f/u with dietician -His appetite has dropped significantly with  recent cholecystitis. He was able to maintain weight. -He will continue nutritional supplement (carnation breakfast and Boost) 2-3 times a day.   4. DM -managed by PCP Dr. Theda Sers -he has had DM for 20 years, he is compliant with regimen and knows how to adjust BG -on insulin, he knows how to adjust dose based on his BG level, but overall not well controlled, I encourage him to f/u with Dr. Theda Sers -Due to elevated BG secondary to chemo pre-meds, he has been needing to increase his insulin but working on controlling it.    5. CAD, HL, HTN -followed by cardiologist Dr. Ellyn Hack -on amlodipine, atenolol, lisinopril, and statin -HTN well controlled  6. Social support -he is single, no children, he lives alone -He has a good friend who can help him and take him to appointments  -I referred him to SW to assess needs - he may benefit from talking with Edwyna Shell about his stress about his diagnosis and treatment  7. Insomnia  -He is able to fall asleep but has trouble staying asleep -He has tried OTC Z-quil which only moderately help.  -If not improving or worsens, I will call in Ambien.   PLAN: -Will contact IR for him to be evaluated today for PTC leakage.  -Will prescribe saline flush syringe -Labs reviewed, if CMP also adequate, will oceed withC4D1Cisplatin/Gemcitabine/Abraxanetodayat reduced dose of abraxane due to overall decreased PS  -Lab, flush, f/u and chemo in 3 and 4 weeks.  -Proceed with Scan at [facility] and see Dr. Carlis Abbott in 2 weeks  No problem-specific  Assessment & Plan notes found for this encounter.   Orders Placed This Encounter  Procedures  . IR Radiologist Eval & Mgmt    Standing Status:   Standing    Number of Occurrences:   1    Order Specific Question:   Symptom/Reason for Exam    Answer:   Cholangiocarcinoma [facility]) [[id number]]    Order Specific Question:   Can the patient sign their own consent?    Answer:   Yes    Order Specific Question:   Is the patient on any bloodthinners?    Answer:   No    Order Specific Question:   Is the patient NPO    Answer:   No    Order Specific Question:   If indicated for the ordered procedure, I authorize the administration of contrast media per Radiology protocol    Answer:   Yes   All questions were answered. The patient knows to call the clinic with any problems, questions or concerns. No barriers to learning was detected. I spent 20 minutes counseling the patient face to face. The total time spent in the appointment was 25 minutes and more than 50% was on counseling and review of test results     [name], MD 05/13/2019   I, Joslyn Devon, am acting as scribe for [name], MD.   I have reviewed the above documentation for accuracy and completeness, and I agree with the above.

## 2019-05-12 ENCOUNTER — Other Ambulatory Visit (HOSPITAL_COMMUNITY): Payer: Self-pay | Admitting: Physician Assistant

## 2019-05-12 DIAGNOSIS — K819 Cholecystitis, unspecified: Secondary | ICD-10-CM

## 2019-05-13 ENCOUNTER — Telehealth: Payer: Self-pay | Admitting: Hematology

## 2019-05-13 ENCOUNTER — Inpatient Hospital Stay: Payer: Medicare Other

## 2019-05-13 ENCOUNTER — Inpatient Hospital Stay (HOSPITAL_BASED_OUTPATIENT_CLINIC_OR_DEPARTMENT_OTHER): Payer: Medicare Other | Admitting: Hematology

## 2019-05-13 ENCOUNTER — Encounter: Payer: Self-pay | Admitting: Hematology

## 2019-05-13 ENCOUNTER — Ambulatory Visit (HOSPITAL_COMMUNITY)
Admission: RE | Admit: 2019-05-13 | Discharge: 2019-05-13 | Disposition: A | Discharge status: Home / Self Care | Payer: Medicare Other | Source: Ambulatory Visit | Attending: Hematology | Admitting: Hematology

## 2019-05-13 ENCOUNTER — Other Ambulatory Visit: Payer: Self-pay

## 2019-05-13 VITALS — BP 130/73 | HR 77 | Temp 97.8°F | Resp 18 | Ht 67.0 in | Wt 148.2 lb

## 2019-05-13 DIAGNOSIS — Z438 Encounter for attention to other artificial openings: Secondary | ICD-10-CM | POA: Diagnosis not present

## 2019-05-13 DIAGNOSIS — C221 Intrahepatic bile duct carcinoma: Secondary | ICD-10-CM | POA: Diagnosis not present

## 2019-05-13 DIAGNOSIS — E785 Hyperlipidemia, unspecified: Secondary | ICD-10-CM | POA: Diagnosis not present

## 2019-05-13 DIAGNOSIS — C240 Malignant neoplasm of extrahepatic bile duct: Secondary | ICD-10-CM | POA: Diagnosis not present

## 2019-05-13 DIAGNOSIS — Z79899 Other long term (current) drug therapy: Secondary | ICD-10-CM | POA: Diagnosis not present

## 2019-05-13 DIAGNOSIS — K810 Acute cholecystitis: Secondary | ICD-10-CM | POA: Diagnosis not present

## 2019-05-13 DIAGNOSIS — E109 Type 1 diabetes mellitus without complications: Secondary | ICD-10-CM | POA: Diagnosis not present

## 2019-05-13 DIAGNOSIS — Z87891 Personal history of nicotine dependence: Secondary | ICD-10-CM | POA: Diagnosis not present

## 2019-05-13 DIAGNOSIS — Z95828 Presence of other vascular implants and grafts: Secondary | ICD-10-CM

## 2019-05-13 DIAGNOSIS — Z5111 Encounter for antineoplastic chemotherapy: Secondary | ICD-10-CM | POA: Diagnosis not present

## 2019-05-13 DIAGNOSIS — Z794 Long term (current) use of insulin: Secondary | ICD-10-CM | POA: Diagnosis not present

## 2019-05-13 DIAGNOSIS — Z5189 Encounter for other specified aftercare: Secondary | ICD-10-CM | POA: Diagnosis not present

## 2019-05-13 DIAGNOSIS — F329 Major depressive disorder, single episode, unspecified: Secondary | ICD-10-CM | POA: Diagnosis not present

## 2019-05-13 DIAGNOSIS — I10 Essential (primary) hypertension: Secondary | ICD-10-CM | POA: Diagnosis not present

## 2019-05-13 HISTORY — PX: IR CHOLANGIOGRAM EXISTING TUBE: IMG6040

## 2019-05-13 LAB — CMP (CANCER CENTER ONLY)
ALT: 33 U/L (ref 0–44)
AST: 31 U/L (ref 15–41)
Albumin: 2.8 g/dL — ABNORMAL LOW (ref 3.5–5.0)
Alkaline Phosphatase: 52 U/L (ref 38–126)
Anion gap: 10 (ref 5–15)
BUN: 22 mg/dL (ref 8–23)
CO2: 25 mmol/L (ref 22–32)
Calcium: 9.3 mg/dL (ref 8.9–10.3)
Chloride: 101 mmol/L (ref 98–111)
Creatinine: 1.32 mg/dL — ABNORMAL HIGH (ref 0.61–1.24)
GFR, Est AFR Am: >60 mL/min (ref >60)
GFR, Estimated: 54 mL/min — ABNORMAL LOW (ref >60)
Glucose, Bld: 212 mg/dL — ABNORMAL HIGH (ref 70–99)
Potassium: 4.4 mmol/L (ref 3.5–5.1)
Sodium: 136 mmol/L (ref 135–145)
Total Bilirubin: 0.3 mg/dL (ref 0.3–1.2)
Total Protein: 6.6 g/dL (ref 6.5–8.1)

## 2019-05-13 LAB — CBC WITH DIFFERENTIAL (CANCER CENTER ONLY)
Abs Immature Granulocytes: 0.1 10*3/uL — ABNORMAL HIGH (ref 0.00–0.07)
Basophils Absolute: 0.1 10*3/uL (ref 0.0–0.1)
Basophils Relative: 1 %
Eosinophils Absolute: 0 10*3/uL (ref 0.0–0.5)
Eosinophils Relative: 1 %
HCT: 30.8 % — ABNORMAL LOW (ref 39.0–52.0)
Hemoglobin: 10.1 g/dL — ABNORMAL LOW (ref 13.0–17.0)
Immature Granulocytes: 1 %
Lymphocytes Relative: 29 %
Lymphs Abs: 2.2 10*3/uL (ref 0.7–4.0)
MCH: 30.2 pg (ref 26.0–34.0)
MCHC: 32.8 g/dL (ref 30.0–36.0)
MCV: 92.2 fL (ref 80.0–100.0)
Monocytes Absolute: 1.4 10*3/uL — ABNORMAL HIGH (ref 0.1–1.0)
Monocytes Relative: 19 %
Neutro Abs: 3.8 10*3/uL (ref 1.7–7.7)
Neutrophils Relative %: 49 %
Platelet Count: 528 10*3/uL — ABNORMAL HIGH (ref 150–400)
RBC: 3.34 MIL/uL — ABNORMAL LOW (ref 4.22–5.81)
RDW: 16.7 % — ABNORMAL HIGH (ref 11.5–15.5)
WBC Count: 7.5 10*3/uL (ref 4.0–10.5)
nRBC: 0 % (ref 0.0–0.2)

## 2019-05-13 LAB — MAGNESIUM: Magnesium: 1.8 mg/dL (ref 1.7–2.4)

## 2019-05-13 MED ORDER — PACLITAXEL PROTEIN-BOUND CHEMO INJECTION 100 MG
80.0000 mg/m2 | Freq: Once | INTRAVENOUS | Status: AC
  Administered 2019-05-13: 14:00:00 150 mg via INTRAVENOUS
  Filled 2019-05-13: qty 30

## 2019-05-13 MED ORDER — SODIUM CHLORIDE 0.9 % IV SOLN
Freq: Once | INTRAVENOUS | Status: AC
  Administered 2019-05-13: 11:00:00 via INTRAVENOUS
  Filled 2019-05-13: qty 250

## 2019-05-13 MED ORDER — SODIUM CHLORIDE 0.9 % IV SOLN
Freq: Once | INTRAVENOUS | Status: AC
  Administered 2019-05-13: 12:00:00 via INTRAVENOUS
  Filled 2019-05-13: qty 1000

## 2019-05-13 MED ORDER — SODIUM CHLORIDE 0.9% FLUSH
10.0000 mL | INTRAVENOUS | Status: DC | PRN
  Administered 2019-05-13: 10 mL
  Filled 2019-05-13: qty 10

## 2019-05-13 MED ORDER — SODIUM CHLORIDE 0.9% FLUSH
10.0000 mL | INTRAVENOUS | Status: DC | PRN
  Administered 2019-05-13: 17:00:00 10 mL
  Filled 2019-05-13: qty 10

## 2019-05-13 MED ORDER — PALONOSETRON HCL INJECTION 0.25 MG/5ML
0.2500 mg | Freq: Once | INTRAVENOUS | Status: AC
  Administered 2019-05-13: 0.25 mg via INTRAVENOUS

## 2019-05-13 MED ORDER — IOHEXOL 300 MG/ML  SOLN
50.0000 mL | Freq: Once | INTRAMUSCULAR | Status: AC | PRN
  Administered 2019-05-13: 10 mL

## 2019-05-13 MED ORDER — SODIUM CHLORIDE 0.9 % IV SOLN
25.0000 mg/m2 | Freq: Once | INTRAVENOUS | Status: AC
  Administered 2019-05-13: 15:00:00 45 mg via INTRAVENOUS
  Filled 2019-05-13: qty 45

## 2019-05-13 MED ORDER — SODIUM CHLORIDE 0.9 % IV SOLN
800.0000 mg/m2 | Freq: Once | INTRAVENOUS | Status: AC
  Administered 2019-05-13: 16:00:00 1444 mg via INTRAVENOUS
  Filled 2019-05-13: qty 37.98

## 2019-05-13 MED ORDER — PALONOSETRON HCL INJECTION 0.25 MG/5ML
INTRAVENOUS | Status: AC
  Filled 2019-05-13: qty 5

## 2019-05-13 MED ORDER — SODIUM CHLORIDE 0.9 % IV SOLN
Freq: Once | INTRAVENOUS | Status: AC
  Administered 2019-05-13: 11:00:00 via INTRAVENOUS
  Filled 2019-05-13: qty 5

## 2019-05-13 MED ORDER — HEPARIN SOD (PORK) LOCK FLUSH 100 UNIT/ML IV SOLN
500.0000 [IU] | Freq: Once | INTRAVENOUS | Status: AC | PRN
  Administered 2019-05-13: 500 [IU]
  Filled 2019-05-13: qty 5

## 2019-05-13 NOTE — Telephone Encounter (Signed)
Scheduled appt per 8/13 los. °

## 2019-05-13 NOTE — Patient Instructions (Signed)
[facility] Discharge Instructions for Patients Receiving Chemotherapy  Today you received the following chemotherapy agents: Abraxane, Cisplatin, and Gemzar.  To help prevent nausea and vomiting after your treatment, we encourage you to take your nausea medication as directed.   If you develop nausea and vomiting that is not controlled by your nausea medication, call the clinic.   BELOW ARE SYMPTOMS THAT SHOULD BE REPORTED IMMEDIATELY:  *FEVER GREATER THAN 100.5 F  *CHILLS WITH OR WITHOUT FEVER  NAUSEA AND VOMITING THAT IS NOT CONTROLLED WITH YOUR NAUSEA MEDICATION  *UNUSUAL SHORTNESS OF BREATH  *UNUSUAL BRUISING OR BLEEDING  TENDERNESS IN MOUTH AND THROAT WITH OR WITHOUT PRESENCE OF ULCERS  *URINARY PROBLEMS  *BOWEL PROBLEMS  UNUSUAL RASH Items with * indicate a potential emergency and should be followed up as soon as possible.  Feel free to call the clinic should you have any questions or concerns. The clinic phone number is [phone].  Please show the [facility] at check-in to the Emergency Department and triage nurse.

## 2019-05-13 NOTE — Progress Notes (Signed)
Per Dr. Burr Medico, okay to run pre-hydration fluids with premeds and abraxane. Okay to run fluids during 30 minute wait time. Okay to release chemo orders without initial 234mL output

## 2019-05-13 NOTE — Telephone Encounter (Signed)
Rescheduled treatment appts due to finding out it is 9 hrs long.  Per sch message.  Called the infusion nurse and informed her the patient appts have been updated and she will let the patient know.

## 2019-05-14 ENCOUNTER — Telehealth: Payer: Self-pay

## 2019-05-14 MED FILL — NORMAL SALINE FLUSH SYRINGE: 0.9 | 30 days supply | Qty: 600 | Fill #0

## 2019-05-14 NOTE — Telephone Encounter (Signed)
Called in script to [facility] for 10 cc NS prefilled syringes, flush drain twice daily #60 with 1 refill.

## 2019-05-17 ENCOUNTER — Telehealth: Payer: Self-pay | Admitting: *Deleted

## 2019-05-17 DIAGNOSIS — C221 Intrahepatic bile duct carcinoma: Secondary | ICD-10-CM

## 2019-05-17 NOTE — Telephone Encounter (Signed)
Received call from pt stating that he was sent to hospital last week due to drainage tube leaking. & they redressed but it is still leaking.  Discussed with Dr Burr Medico & she would refer back to IR.  IR. Notified & informed to put in Rad eval & they will schedule.

## 2019-05-18 ENCOUNTER — Other Ambulatory Visit: Payer: Self-pay

## 2019-05-18 ENCOUNTER — Ambulatory Visit (HOSPITAL_COMMUNITY)
Admission: RE | Admit: 2019-05-18 | Discharge: 2019-05-18 | Disposition: A | Discharge status: Home / Self Care | Payer: Medicare Other | Source: Ambulatory Visit | Attending: Hematology | Admitting: Hematology

## 2019-05-18 ENCOUNTER — Encounter (HOSPITAL_COMMUNITY): Payer: Self-pay | Admitting: Interventional Radiology

## 2019-05-18 ENCOUNTER — Other Ambulatory Visit: Payer: Self-pay | Admitting: Hematology

## 2019-05-18 DIAGNOSIS — Y831 Surgical operation with implant of artificial internal device as the cause of abnormal reaction of the patient, or of later complication, without mention of misadventure at the time of the procedure: Secondary | ICD-10-CM | POA: Diagnosis not present

## 2019-05-18 DIAGNOSIS — C221 Intrahepatic bile duct carcinoma: Secondary | ICD-10-CM | POA: Diagnosis not present

## 2019-05-18 DIAGNOSIS — T85638A Leakage of other specified internal prosthetic devices, implants and grafts, initial encounter: Secondary | ICD-10-CM | POA: Diagnosis not present

## 2019-05-18 DIAGNOSIS — T83030A Leakage of cystostomy catheter, initial encounter: Secondary | ICD-10-CM | POA: Insufficient documentation

## 2019-05-18 HISTORY — PX: IR EXCHANGE BILIARY DRAIN: IMG6046

## 2019-05-18 MED ORDER — IOHEXOL 300 MG/ML  SOLN
50.0000 mL | Freq: Once | INTRAMUSCULAR | Status: AC | PRN
  Administered 2019-05-18: 10 mL

## 2019-05-18 MED ORDER — LIDOCAINE HCL 1 % IJ SOLN
INTRAMUSCULAR | Status: AC
  Filled 2019-05-18: qty 20

## 2019-05-18 NOTE — Progress Notes (Signed)
Christopher Welch   [phone] [phone]   Clinic Follow up Note   Patient Care Team: [name], DO as PCP - General (Family Medicine)  Date of Service:  05/20/2019  CHIEF COMPLAINT:  F/u of ExtrahepaticCholangiocarcinoma  SUMMARY OF ONCOLOGIC HISTORY: Oncology History  Cholangiocarcinoma ([place])  01/14/2019 Imaging   US Abdomen 01/14/19  IMPRESSION: There is significant intrahepatic and extrahepatic biliary dilatation present, concerning for distal common bile duct obstruction. Further evaluation with CT scan or MRCP is recommended. Correlation with liver function tests is recommended as well. These results will be called to the ordering clinician or representative by the Radiologist Assistant, and communication documented in the PACS or zVision Dashboard.   Probable large amount of sludge seen within gallbladder lumen with mild gallbladder wall thickening. However, no cholelithiasis, pericholecystic fluid or sonographic Murphy's sign is noted.   4.2 cm septated cyst seen in upper pole of right kidney consistent with Bosniak type 2 lesion. Follow-up ultrasound in 1 year is recommended to ensure stability.   01/20/2019 Imaging   MRI abdomen 01/20/19  IMPRESSION: 1. Findings are highly concerning for central tumor in the biliary tract at the confluence of the common hepatic duct, cystic duct and common bile ducts. Further clinical evaluation for potential cholangiocarcinoma is strongly recommended. 2. Mild ductal dilatation of the main pancreatic duct throughout the distal body and tail of the pancreas where there is also some associated side branch ectasia. This may suggest a pancreatic ductal stricture. No obstructing pancreatic neoplasm identified. 3. Aortic atherosclerosis.   01/21/2019 Initial Biopsy   Diagnosis 01/21/19  BILE DUCT BRUSHING (SPECIMEN 1 OF 1 COLLECTED 01/21/2019) ADENOCARCINOMA.   01/21/2019 Procedure   ERCP By Dr hung  01/21/19 IMRPESSION - The major papilla appeared normal. - A single localized biliary stricture was found in the middle third of the main bile duct. - The entire main bile duct and upper third of the main bile duct were dilated, secondary to a stricture. - A biliary sphincterotomy was performed. - Cells for cytology obtained in the middle third of the main bile duct. - One plastic stent was placed into the common bile duct.  EUS by Dr hung 01/21/19  IMPRESSION - There was dilation in the middle third of the main bile duct and in the upper third of the main bile duct which measured up to 15 mm. - There was a suggestion of a stricture in the middle third of the main bile duct. - No specimens collected.   02/03/2019 Imaging   [facility] at [facility] 02/03/19  Impression:  1. There is mild intrahepatic and extrahepatic biliary ductal dilation and diffuse common bile duct wall thickening with stent in place. There is abnormal soft tissue measuring approximately 1.6 cm between the common hepatic artery, portal vein and common bile duct, worrisome for the known cholangiocarcinoma.  2. Periportal lymphadenopathy which may represent nodal metastasis.  4. Marked pancreatic atrophy and duct dilation involving the distal body and tail the pancreas where there is a coarse calcification. These findings are favored to represent stenosis from intraductal stones though an underlying stricture cannot be completely excluded.  5. Atypical symmetric prominent fat stranding of the bilateral lower abdominal wall. Correlate with surgical history or history of history of trauma.   03/08/2019 Initial Diagnosis   Cholangiocarcinoma ([place])   03/12/2019 -  Chemotherapy   gemcitabine and cisplatin on days 1 and 8 every 21 days starting 03/12/19. Abraxane added with cycle 2. Added GCSF [name])  to day 9 starting cycle 2.    03/19/2019 Cancer Staging   Staging form: Perihilar Bile Ducts, AJCC 8th Edition - Clinical:  Stage Unknown (cTX, cN0, cM0) - Signed by [name], MD on 03/19/2019      CURRENT THERAPY:  gemcitabine and cisplatin on days 1 and 8 every 21 daysstarting 03/12/19. Abraxane added with cycle 2.AddedGCSF ([name])to day 9 startingcycle 2.  INTERVAL HISTORY:  Christopher Welch is here for a follow up and treatment. He presents to the clinic alone.  He is percutaneous cholecystomy tube started leaking again earlier this week, and was exchanged by IR 2 days ago.  It is working well now.  He tolerated chemotherapy moderately well last week, with fatigue, and a low appetite.  He has been drinking nutritional supplement condition breakfast, lost a few pounds since last week.  He denies any fever, chills, bleeding, or other new symptoms.  He is able to function at home.    REVIEW OF SYSTEMS:   Constitutional: Denies fevers, chills, (+) fatigue and weight loss  Eyes: Denies blurriness of vision Ears, nose, mouth, throat, and face: Denies mucositis or sore throat Respiratory: Denies cough, dyspnea or wheezes Cardiovascular: Denies palpitation, chest discomfort or lower extremity swelling Gastrointestinal:  Denies nausea, heartburn or change in bowel habits, (+) RUQ mild pain at draining tube  Skin: Denies abnormal skin rashes Lymphatics: Denies new lymphadenopathy or easy bruising Neurological:Denies numbness, tingling or new weaknesses Behavioral/Psych: Mood is stable, no new changes  All other systems were reviewed with the patient and are negative.  MEDICAL HISTORY:  Past Medical History:  Diagnosis Date  . Diabetes mellitus type I, controlled ([place])   . Dyslipidemia   . ED (erectile dysfunction)   . Essential hypertension   . Gilbert's disease   . Hepatitis B carrier [facility])     SURGICAL HISTORY: Past Surgical History:  Procedure Laterality Date  . APPENDECTOMY  1964  . BILIARY BRUSHING  01/21/2019   Procedure: BILIARY BRUSHING;  Surgeon: Carol Ada, MD;  Location: [facility] ENDOSCOPY;   Service: Endoscopy;;  . BILIARY STENT PLACEMENT N/A 01/21/2019   Procedure: BILIARY STENT PLACEMENT;  Surgeon: Carol Ada, MD;  Location: [facility] ENDOSCOPY;  Service: Endoscopy;  Laterality: N/A;  . ENDOSCOPIC RETROGRADE CHOLANGIOPANCREATOGRAPHY (ERCP) WITH PROPOFOL N/A 01/21/2019   Procedure: ENDOSCOPIC RETROGRADE CHOLANGIOPANCREATOGRAPHY (ERCP) WITH PROPOFOL;  Surgeon: Carol Ada, MD;  Location: [facility];  Service: Endoscopy;  Laterality: N/A;  . ESOPHAGOGASTRODUODENOSCOPY (EGD) WITH PROPOFOL N/A 01/21/2019   Procedure: ESOPHAGOGASTRODUODENOSCOPY (EGD) WITH PROPOFOL;  Surgeon: Carol Ada, MD;  Location: [facility];  Service: Endoscopy;  Laterality: N/A;  . FEMORAL HERNIA REPAIR    . IR CHOLANGIOGRAM EXISTING TUBE  05/13/2019  . IR EXCHANGE BILIARY DRAIN  05/18/2019  . IR IMAGING GUIDED PORT INSERTION  03/08/2019  . IR PERC CHOLECYSTOSTOMY  05/04/2019  . LEFT HEART CATH AND CORONARY ANGIOGRAPHY N/A 05/19/2017   Procedure: LEFT HEART CATH AND CORONARY ANGIOGRAPHY;  Surgeon: Leonie Man, MD;  Location: [facility];  Service: Cardiovascular;  Laterality: N/A;  . SPHINCTEROTOMY  01/21/2019   Procedure: SPHINCTEROTOMY;  Surgeon: Carol Ada, MD;  Location: [facility];  Service: Endoscopy;;  . UPPER ESOPHAGEAL ENDOSCOPIC ULTRASOUND (EUS) N/A 01/21/2019   Procedure: UPPER ESOPHAGEAL ENDOSCOPIC ULTRASOUND (EUS);  Surgeon: Carol Ada, MD;  Location: [facility];  Service: Endoscopy;  Laterality: N/A;    I have reviewed the social history and family history with the patient and they are unchanged from previous note.  ALLERGIES:  is allergic to erythromycin.  MEDICATIONS:  Current Outpatient Medications  Medication Sig Dispense Refill  . amLODipine (NORVASC) 5 MG tablet Take 5 mg by mouth daily.     Marland Kitchen atenolol (TENORMIN) 25 MG tablet Take by mouth daily. Once a day    . atorvastatin (LIPITOR) 10 MG tablet Take 10 mg by mouth daily.    . Blood Glucose Monitoring Suppl (ACCU-CHEK  NANO SMARTVIEW) W/DEVICE KIT 1 kit by Does not apply route 2 (two) times daily. (Patient taking differently: 1 kit by Does not apply route See admin instructions. Test blood sugars 12x's daily) 1 kit 0  . glucose blood test strip Test 3 times a day. (Patient taking differently: 1 each by Other route See admin instructions. Test 12 times a day.) 300 each Prn  . insulin NPH Human (NOVOLIN N) 100 UNIT/ML injection Inject 20 Units into the skin 2 (two) times a day.     . insulin regular (NOVOLIN R) 100 units/mL injection Inject 0-40 Units into the skin 3 (three) times daily before meals. Sliding scale 0-40 units    . lidocaine-prilocaine (EMLA) cream Apply to affected area once 30 g 3  . lisinopril (ZESTRIL) 20 MG tablet Take 20 mg by mouth daily.     . magnesium oxide (MAG-OX) 400 (241.3 Mg) MG tablet Take 1 tablet (400 mg total) by mouth daily. 30 tablet 2  . metFORMIN (GLUCOPHAGE) 1000 MG tablet Take 2,000 mg by mouth every evening.     . ondansetron (ZOFRAN) 8 MG tablet TAKE 1 TABLET BY MOUTH 2 TIMES DAY AS NEEDED. START ON 3RD DAY AFTER CHEMOTHERAPY (Patient taking differently: Take 8 mg by mouth 2 (two) times daily as needed. Start on 3rd day after chemotherapy) 30 tablet 1  . oxycodone (OXY-IR) 5 MG capsule Take 1 capsule (5 mg total) by mouth every 6 (six) hours as needed for pain. 60 capsule 0  . prochlorperazine (COMPAZINE) 10 MG tablet TAKE 1 TABLET (10 MG TOTAL) BY MOUTH EVERY 6 (SIX) HOURS AS NEEDED (NAUSEA OR VOMITING). (Patient taking differently: Take 10 mg by mouth every 6 (six) hours as needed for nausea or vomiting (Nausea or vomiting). ) 30 tablet 1  . zolpidem (AMBIEN) 5 MG tablet Take 1-2 tablets (5-10 mg total) by mouth at bedtime as needed for sleep. 60 tablet 0   No current facility-administered medications for this visit.     PHYSICAL EXAMINATION: ECOG PERFORMANCE STATUS: 2 - Symptomatic, <50% confined to bed  Vitals:   05/20/19 0807  BP: 115/62  Pulse: 84  Resp: 18   Temp: 98.3 F (36.8 C)  SpO2: 98%   Filed Weights   05/20/19 0807  Weight: 145 lb 6.4 oz (66 kg)   GENERAL:alert, no distress and comfortable SKIN: skin color, texture, turgor are normal, no rashes or significant lesions EYES: normal, Conjunctiva are pink and non-injected, sclera clear  NECK: supple, thyroid normal size, non-tender, without nodularity LYMPH:  no palpable lymphadenopathy in the cervical, axillary  LUNGS: clear to auscultation and percussion with normal breathing effort HEART: regular rate & rhythm and no murmurs and no lower extremity edema ABDOMEN:abdomen soft, non-tender and normal bowel sounds, (+) J-tube in the right upper quadrant covered by gauze, mild tenderness, no leakage.  Light yellow fluids seen in the drainage bag. Musculoskeletal:no cyanosis of digits and no clubbing  NEURO: alert & oriented x 3 with fluent speech, no focal motor/sensory deficits  LABORATORY DATA:  I have reviewed the data as listed CBC Latest  Ref Rng & Units 05/20/2019 05/13/2019 05/07/2019  WBC 4.0 - 10.5 K/uL 6.5 7.5 -  Hemoglobin 13.0 - 17.0 g/dL 9.8(L) 10.1(L) 9.5(L)  Hematocrit 39.0 - 52.0 % 29.8(L) 30.8(L) 28.5(L)  Platelets 150 - 400 K/uL 353 528(H) -     CMP Latest Ref Rng & Units 05/20/2019 05/13/2019 05/05/2019  Glucose 70 - 99 mg/dL 225(H) 212(H) 180(H)  BUN 8 - 23 mg/dL _0 Creatinine 0.61 - 1.24 mg/dL 1.21 1.32(H) 0.96  Sodium 135 - 145 mmol/L 133(L) 136 135  Potassium 3.5 - 5.1 mmol/L 4.7 4.4 3.7  Chloride 98 - 111 mmol/L 99 101 105  CO2 22 - 32 mmol/L _1 Calcium 8.9 - 10.3 mg/dL 9.1 9.3 7.7(L)  Total Protein 6.5 - 8.1 g/dL 6.8 6.6 5.4(L)  Total Bilirubin 0.3 - 1.2 mg/dL 0.3 0.3 0.6  Alkaline Phos 38 - 126 U/L 52 52 46  AST 15 - 41 U/L _2 ALT 0 - 44 U/L 20 33 26      RADIOGRAPHIC STUDIES: I have personally reviewed the radiological images as listed and agreed with the findings in the report. Ir Exchange Biliary Drain  Result Date: 05/18/2019  INDICATION: Chronic cholecystitis, cholangiocarcinoma, non candidate, retracted leaking cholecystostomy EXAM: Fluoroscopic exchange of the cholecystostomy MEDICATIONS: None. ANESTHESIA/SEDATION: Moderate Sedation Time: None. Minutes. The patient's level of consciousness and vital signs were monitored continuously by radiology nursing throughout the procedure under my direct supervision. FLUOROSCOPY TIME:  Fluoroscopy Time: 0 minutes 48 seconds (8 mGy). COMPLICATIONS: None immediate. PROCEDURE: Informed written consent was obtained from the patient after a thorough discussion of the procedural risks, benefits and alternatives. All questions were addressed. Maximal Sterile Barrier Technique was utilized including caps, mask, sterile gowns, sterile gloves, sterile drape, hand hygiene and skin antiseptic. A timeout was performed prior to the initiation of the procedure. Under fluoroscopy, the cholecystostomy has retracted with the side ports close to the skin surface. This may result leakage around the catheter. Catheter was cut and exchanged over Bentson guidewire. Retention loop formed in the gallbladder. Contrast injection confirms position. Gallbladder decompressed by syringe aspiration. Catheter secured with a Ethilon suture and connected to external gravity drainage bag. Sterile dressing applied. No immediate complication. Patient tolerated the procedure well. IMPRESSION: Successful fluoroscopic exchange and reposition of the cholecystostomy Electronically Signed   By: Jerilynn Mages.  Shick M.D.   On: 05/18/2019 13:14     ASSESSMENT & PLAN:  ALIC HILBURN is a 73 y.o. male with   1. Extrahepatic cholangiocarcinoma, cTxN0M0 -He wasdiagnosedin 12/2018. His work up revealed malignant stricture and CBD obstruction required stenting. Brushing revealed adenocarcinoma. His extrahepatic cholangiocarcinoma was not resectable due to vascular invasion(surgeon - Dr. Zenia Resides at [facility]) -He was referred to our clinic to initiate  chemotherapy in the neoadjuvant setting. -He proceeded with neoadjuvantgemcitabine and cisplatin on days 1 and 8 every 21 daysstarting 03/12/19. Abraxane added with cycle 2.AddedGCSF ([name])to day 9 startingcycle 2. -Hehas beentolerating chemo verywell so far with no significant issues.  -Histumor marker CA 19.9hasdropped significantly as he started chemo, indicating good response to chemo treatment -He plans to have second opinion at [facility] with medical Oncologist Dr. Jess Barters andanticipate to see surgeon Dr. Carlis Abbott next week with restaging scan at [facility]  -He clincially improved after percutaneous gallbladder drainage tube was placed for acute cholecystitis and I restarted chemo last week. -He is doing moderately well, lab results still pending, if it is adequate, will proceed chemo today.  Due to  overall decreased performance status, I will hold Abraxane today, and proceed with gemcitabine and cisplatin. -Off chemo next week -Follow-up in 2 weeks  2. Acute cholecystitis -He was hospitalized on 05/03/19.  -He had a percutaneous gallbladder drainage tube placed by IR 05/05/19. -He was also treated with antibiotics, finished  -Abdominal pain much improved.  -tube exchanged 8/18 due to leakage   3. Weight loss and low appetite  -secondary to malignancy and chemo  -he presented with 20 lbs weight loss  -We reviewed nutrition support withGlucerna -Continue to f/u with dietician -His appetite has dropped significantly with recent cholecystitis. He was able to maintain weight. -He will continue nutritional supplement (carnation breakfast and Boost) 2-3 times a day.   4. DM -managed by PCP Dr. Theda Sers -he has had DM for 20 years, he is compliant with regimen and knows how to adjust BG -on insulin, he knows how to adjust dose based on his BG level, but overall not well controlled, I encourage him to f/u with Dr. Theda Sers -Due to elevated BG secondary to chemo pre-meds, he has been  needing to increase his insulin but working on controlling it.   5. CAD, HL, HTN -followed by cardiologist Dr. Ellyn Hack -on amlodipine, atenolol, lisinopril, and statin -HTN well controlled  6. Social support -he is single, no children, he lives alone -He has a good friend who can help him and take him to appointments  -I previously referred him to SW to assess needs - he may benefit from talking with Christopher Welch about his stress about his diagnosis and treatment  7.Insomnia -He is able to fall asleep but has trouble staying asleep -He has tried OTC Z-quil which only moderately help.  -If not improving or worsens, I will call in Ambien.   PLAN: -Lab reviewed, adequate for treatment, due to overall decreased performance status, I will hold Abraxane today, and proceed with gemcitabine and cisplatin, he [name] injection in 2 days -He is scheduled to have restaging CT scan at [facility], and see Dr. Carlis Abbott next week -Return in 2 weeks   No orders of the defined types were placed in this encounter.  All questions were answered. The patient knows to call the clinic with any problems, questions or concerns. No barriers to learning was detected. I spent 20 minutes counseling the patient face to face. The total time spent in the appointment was 25 minutes and more than 50% was on counseling and review of test results     [name], MD 05/20/2019   I, Joslyn Devon, am acting as scribe for [name], MD.   I have reviewed the above documentation for accuracy and completeness, and I agree with the above.

## 2019-05-20 ENCOUNTER — Inpatient Hospital Stay: Payer: Medicare Other

## 2019-05-20 ENCOUNTER — Other Ambulatory Visit: Payer: Self-pay

## 2019-05-20 ENCOUNTER — Other Ambulatory Visit: Payer: Medicare Other

## 2019-05-20 ENCOUNTER — Ambulatory Visit: Payer: Medicare Other

## 2019-05-20 ENCOUNTER — Encounter: Payer: Self-pay | Admitting: Hematology

## 2019-05-20 ENCOUNTER — Ambulatory Visit: Payer: Medicare Other | Admitting: Nurse Practitioner

## 2019-05-20 ENCOUNTER — Inpatient Hospital Stay (HOSPITAL_BASED_OUTPATIENT_CLINIC_OR_DEPARTMENT_OTHER): Payer: Medicare Other | Admitting: Hematology

## 2019-05-20 VITALS — BP 115/62 | HR 84 | Temp 98.3°F | Resp 18 | Ht 67.0 in | Wt 145.4 lb

## 2019-05-20 DIAGNOSIS — C221 Intrahepatic bile duct carcinoma: Secondary | ICD-10-CM

## 2019-05-20 DIAGNOSIS — Z5111 Encounter for antineoplastic chemotherapy: Secondary | ICD-10-CM | POA: Diagnosis not present

## 2019-05-20 DIAGNOSIS — F329 Major depressive disorder, single episode, unspecified: Secondary | ICD-10-CM | POA: Diagnosis not present

## 2019-05-20 DIAGNOSIS — E785 Hyperlipidemia, unspecified: Secondary | ICD-10-CM | POA: Diagnosis not present

## 2019-05-20 DIAGNOSIS — E109 Type 1 diabetes mellitus without complications: Secondary | ICD-10-CM | POA: Diagnosis not present

## 2019-05-20 DIAGNOSIS — C240 Malignant neoplasm of extrahepatic bile duct: Secondary | ICD-10-CM | POA: Diagnosis not present

## 2019-05-20 DIAGNOSIS — Z5189 Encounter for other specified aftercare: Secondary | ICD-10-CM | POA: Diagnosis not present

## 2019-05-20 LAB — CBC WITH DIFFERENTIAL (CANCER CENTER ONLY)
Abs Immature Granulocytes: 0.04 10*3/uL (ref 0.00–0.07)
Basophils Absolute: 0 10*3/uL (ref 0.0–0.1)
Basophils Relative: 1 %
Eosinophils Absolute: 0 10*3/uL (ref 0.0–0.5)
Eosinophils Relative: 0 %
HCT: 29.8 % — ABNORMAL LOW (ref 39.0–52.0)
Hemoglobin: 9.8 g/dL — ABNORMAL LOW (ref 13.0–17.0)
Immature Granulocytes: 1 %
Lymphocytes Relative: 29 %
Lymphs Abs: 1.9 10*3/uL (ref 0.7–4.0)
MCH: 29.3 pg (ref 26.0–34.0)
MCHC: 32.9 g/dL (ref 30.0–36.0)
MCV: 89.2 fL (ref 80.0–100.0)
Monocytes Absolute: 0.7 10*3/uL (ref 0.1–1.0)
Monocytes Relative: 11 %
Neutro Abs: 3.8 10*3/uL (ref 1.7–7.7)
Neutrophils Relative %: 58 %
Platelet Count: 353 10*3/uL (ref 150–400)
RBC: 3.34 MIL/uL — ABNORMAL LOW (ref 4.22–5.81)
RDW: 16 % — ABNORMAL HIGH (ref 11.5–15.5)
WBC Count: 6.5 10*3/uL (ref 4.0–10.5)
nRBC: 0 % (ref 0.0–0.2)

## 2019-05-20 LAB — CMP (CANCER CENTER ONLY)
ALT: 20 U/L (ref 0–44)
AST: 17 U/L (ref 15–41)
Albumin: 3 g/dL — ABNORMAL LOW (ref 3.5–5.0)
Alkaline Phosphatase: 52 U/L (ref 38–126)
Anion gap: 9 (ref 5–15)
BUN: 23 mg/dL (ref 8–23)
CO2: 25 mmol/L (ref 22–32)
Calcium: 9.1 mg/dL (ref 8.9–10.3)
Chloride: 99 mmol/L (ref 98–111)
Creatinine: 1.21 mg/dL (ref 0.61–1.24)
GFR, Est AFR Am: >60 mL/min (ref >60)
GFR, Estimated: 60 mL/min — ABNORMAL LOW (ref >60)
Glucose, Bld: 225 mg/dL — ABNORMAL HIGH (ref 70–99)
Potassium: 4.7 mmol/L (ref 3.5–5.1)
Sodium: 133 mmol/L — ABNORMAL LOW (ref 135–145)
Total Bilirubin: 0.3 mg/dL (ref 0.3–1.2)
Total Protein: 6.8 g/dL (ref 6.5–8.1)

## 2019-05-20 LAB — MAGNESIUM: Magnesium: 2 mg/dL (ref 1.7–2.4)

## 2019-05-20 MED ORDER — SODIUM CHLORIDE 0.9 % IV SOLN
800.0000 mg/m2 | Freq: Once | INTRAVENOUS | Status: AC
  Administered 2019-05-20: 1444 mg via INTRAVENOUS
  Filled 2019-05-20: qty 37.98

## 2019-05-20 MED ORDER — SODIUM CHLORIDE 0.9% FLUSH
10.0000 mL | INTRAVENOUS | Status: DC | PRN
  Administered 2019-05-20: 10 mL
  Filled 2019-05-20: qty 10

## 2019-05-20 MED ORDER — PALONOSETRON HCL INJECTION 0.25 MG/5ML
0.2500 mg | Freq: Once | INTRAVENOUS | Status: AC
  Administered 2019-05-20: 13:00:00 0.25 mg via INTRAVENOUS

## 2019-05-20 MED ORDER — SODIUM CHLORIDE 0.9 % IV SOLN
Freq: Once | INTRAVENOUS | Status: AC
  Administered 2019-05-20: 13:00:00 via INTRAVENOUS
  Filled 2019-05-20: qty 5

## 2019-05-20 MED ORDER — SODIUM CHLORIDE 0.9 % IV SOLN
25.0000 mg/m2 | Freq: Once | INTRAVENOUS | Status: AC
  Administered 2019-05-20: 14:00:00 45 mg via INTRAVENOUS
  Filled 2019-05-20: qty 45

## 2019-05-20 MED ORDER — SODIUM CHLORIDE 0.9 % IV SOLN
Freq: Once | INTRAVENOUS | Status: AC
  Administered 2019-05-20: 10:00:00 via INTRAVENOUS
  Filled 2019-05-20: qty 250

## 2019-05-20 MED ORDER — PALONOSETRON HCL INJECTION 0.25 MG/5ML
INTRAVENOUS | Status: AC
  Filled 2019-05-20: qty 5

## 2019-05-20 MED ORDER — SODIUM CHLORIDE 0.9 % IV SOLN
Freq: Once | INTRAVENOUS | Status: AC
  Administered 2019-05-20: 11:00:00 via INTRAVENOUS
  Filled 2019-05-20: qty 1000

## 2019-05-20 MED ORDER — HEPARIN SOD (PORK) LOCK FLUSH 100 UNIT/ML IV SOLN
500.0000 [IU] | Freq: Once | INTRAVENOUS | Status: AC | PRN
  Administered 2019-05-20: 500 [IU]
  Filled 2019-05-20: qty 5

## 2019-05-20 NOTE — Progress Notes (Signed)
Patient has not had any urine output prior to Cisplatin today. Reported to Dr. Burr Medico. MD has d/c'd Abraxane for today. Will continue to monitor for urine output. Otherwise, OK to proceed.

## 2019-05-20 NOTE — Patient Instructions (Signed)
[facility] Discharge Instructions for Patients Receiving Chemotherapy  Today you received the following chemotherapy agents: Abraxane, Cisplatin, Gemzar   To help prevent nausea and vomiting after your treatment, we encourage you to take your nausea medication as directed.    If you develop nausea and vomiting that is not controlled by your nausea medication, call the clinic.   BELOW ARE SYMPTOMS THAT SHOULD BE REPORTED IMMEDIATELY:  *FEVER GREATER THAN 100.5 F  *CHILLS WITH OR WITHOUT FEVER  NAUSEA AND VOMITING THAT IS NOT CONTROLLED WITH YOUR NAUSEA MEDICATION  *UNUSUAL SHORTNESS OF BREATH  *UNUSUAL BRUISING OR BLEEDING  TENDERNESS IN MOUTH AND THROAT WITH OR WITHOUT PRESENCE OF ULCERS  *URINARY PROBLEMS  *BOWEL PROBLEMS  UNUSUAL RASH Items with * indicate a potential emergency and should be followed up as soon as possible.  Feel free to call the clinic should you have any questions or concerns. The clinic phone number is [phone].  Please show the [facility] at check-in to the Emergency Department and triage nurse.

## 2019-05-21 ENCOUNTER — Encounter: Payer: Self-pay | Admitting: Hematology

## 2019-05-21 ENCOUNTER — Telehealth: Payer: Self-pay | Admitting: Hematology

## 2019-05-21 ENCOUNTER — Ambulatory Visit: Payer: Medicare Other

## 2019-05-21 NOTE — Telephone Encounter (Signed)
No los per 8/20,. °

## 2019-05-22 ENCOUNTER — Inpatient Hospital Stay: Payer: Medicare Other

## 2019-05-22 ENCOUNTER — Other Ambulatory Visit: Payer: Self-pay

## 2019-05-22 VITALS — BP 137/76 | HR 75 | Temp 98.2°F | Resp 18

## 2019-05-22 DIAGNOSIS — Z5189 Encounter for other specified aftercare: Secondary | ICD-10-CM | POA: Diagnosis not present

## 2019-05-22 DIAGNOSIS — F329 Major depressive disorder, single episode, unspecified: Secondary | ICD-10-CM | POA: Diagnosis not present

## 2019-05-22 DIAGNOSIS — E785 Hyperlipidemia, unspecified: Secondary | ICD-10-CM | POA: Diagnosis not present

## 2019-05-22 DIAGNOSIS — E109 Type 1 diabetes mellitus without complications: Secondary | ICD-10-CM | POA: Diagnosis not present

## 2019-05-22 DIAGNOSIS — C221 Intrahepatic bile duct carcinoma: Secondary | ICD-10-CM

## 2019-05-22 DIAGNOSIS — Z5111 Encounter for antineoplastic chemotherapy: Secondary | ICD-10-CM | POA: Diagnosis not present

## 2019-05-22 DIAGNOSIS — C240 Malignant neoplasm of extrahepatic bile duct: Secondary | ICD-10-CM | POA: Diagnosis not present

## 2019-05-22 MED ORDER — PEGFILGRASTIM-CBQV 6 MG/0.6ML ~~LOC~~ SOSY
PREFILLED_SYRINGE | SUBCUTANEOUS | Status: AC
  Filled 2019-05-22: qty 0.6

## 2019-05-22 MED ORDER — PEGFILGRASTIM-CBQV 6 MG/0.6ML ~~LOC~~ SOSY
6.0000 mg | PREFILLED_SYRINGE | Freq: Once | SUBCUTANEOUS | Status: AC
  Administered 2019-05-22: 6 mg via SUBCUTANEOUS

## 2019-05-22 NOTE — Patient Instructions (Signed)

## 2019-05-24 DIAGNOSIS — J984 Other disorders of lung: Secondary | ICD-10-CM | POA: Diagnosis not present

## 2019-05-24 DIAGNOSIS — J90 Pleural effusion, not elsewhere classified: Secondary | ICD-10-CM | POA: Diagnosis not present

## 2019-05-24 DIAGNOSIS — C221 Intrahepatic bile duct carcinoma: Secondary | ICD-10-CM | POA: Diagnosis not present

## 2019-05-24 DIAGNOSIS — Z87891 Personal history of nicotine dependence: Secondary | ICD-10-CM | POA: Diagnosis not present

## 2019-05-31 ENCOUNTER — Other Ambulatory Visit: Payer: Self-pay | Admitting: Hematology

## 2019-06-01 NOTE — Progress Notes (Signed)
[place]   [phone] Fax:([phone]   Clinic Follow up Note   Patient Care Team: [name], DO as PCP - General (Family Medicine) 06/03/2019  CHIEF COMPLAINT: f/u extrahepatic cholangiocarcinoma   SUMMARY OF ONCOLOGIC HISTORY: Oncology History  Cholangiocarcinoma ([place])  01/14/2019 Imaging   US Abdomen 01/14/19  IMPRESSION: There is significant intrahepatic and extrahepatic biliary dilatation present, concerning for distal common bile duct obstruction. Further evaluation with CT scan or MRCP is recommended. Correlation with liver function tests is recommended as well. These results will be called to the ordering clinician or representative by the Radiologist Assistant, and communication documented in the PACS or zVision Dashboard.   Probable large amount of sludge seen within gallbladder lumen with mild gallbladder wall thickening. However, no cholelithiasis, pericholecystic fluid or sonographic Murphy's sign is noted.   4.2 cm septated cyst seen in upper pole of right kidney consistent with Bosniak type 2 lesion. Follow-up ultrasound in 1 year is recommended to ensure stability.   01/20/2019 Imaging   MRI abdomen 01/20/19  IMPRESSION: 1. Findings are highly concerning for central tumor in the biliary tract at the confluence of the common hepatic duct, cystic duct and common bile ducts. Further clinical evaluation for potential cholangiocarcinoma is strongly recommended. 2. Mild ductal dilatation of the main pancreatic duct throughout the distal body and tail of the pancreas where there is also some associated side branch ectasia. This may suggest a pancreatic ductal stricture. No obstructing pancreatic neoplasm identified. 3. Aortic atherosclerosis.   01/21/2019 Initial Biopsy   Diagnosis 01/21/19  BILE DUCT BRUSHING (SPECIMEN 1 OF 1 COLLECTED 01/21/2019) ADENOCARCINOMA.   01/21/2019 Procedure   ERCP By Dr hung 01/21/19 IMRPESSION -  The major papilla appeared normal. - A single localized biliary stricture was found in the middle third of the main bile duct. - The entire main bile duct and upper third of the main bile duct were dilated, secondary to a stricture. - A biliary sphincterotomy was performed. - Cells for cytology obtained in the middle third of the main bile duct. - One plastic stent was placed into the common bile duct.  EUS by Dr hung 01/21/19  IMPRESSION - There was dilation in the middle third of the main bile duct and in the upper third of the main bile duct which measured up to 15 mm. - There was a suggestion of a stricture in the middle third of the main bile duct. - No specimens collected.   02/03/2019 Imaging   [facility] at [facility] 02/03/19  Impression:  1. There is mild intrahepatic and extrahepatic biliary ductal dilation and diffuse common bile duct wall thickening with stent in place. There is abnormal soft tissue measuring approximately 1.6 cm between the common hepatic artery, portal vein and common bile duct, worrisome for the known cholangiocarcinoma.  2. Periportal lymphadenopathy which may represent nodal metastasis.  4. Marked pancreatic atrophy and duct dilation involving the distal body and tail the pancreas where there is a coarse calcification. These findings are favored to represent stenosis from intraductal stones though an underlying stricture cannot be completely excluded.  5. Atypical symmetric prominent fat stranding of the bilateral lower abdominal wall. Correlate with surgical history or history of history of trauma.   03/08/2019 Initial Diagnosis   Cholangiocarcinoma ([place])   03/12/2019 -  Chemotherapy   gemcitabine and cisplatin on days 1 and 8 every 21 days starting 03/12/19. Abraxane added with cycle 2. Added GCSF [name]) to day 9 starting cycle  2.    03/19/2019 Cancer Staging   Staging form: Perihilar Bile Ducts, AJCC 8th Edition - Clinical: Stage Unknown (cTX, cN0,  cM0) - Signed by [name], MD on 03/19/2019   05/24/2019 Imaging   1.  Interval placement of covered metallic biliary stent. No progressive dilation of the biliary tree and resulting resolution of the prior main pancreatic duct dilation. 2.  Evidence of some subtle vascular contour deformity involving the hepatic artery, suspected to be from tumoral contact and/or posttreatment changes. 3.  New small right pleural effusion with overlying mild airspace disease. Secondary findings which may indicate a component of aspiration. 4.  No convincing evidence of new disseminated metastatic disease. 5.  Additional findings as discussed above.     CURRENT THERAPY: gemcitabine and cisplatin on days 1 and 8 every 21 daysstarting 03/12/19. Abraxane added with cycle 2.AddedGCSF ([name])to day 9 startingcycle 2.  INTERVAL HISTORY: Mr. Carrington returns for f/u and treatment as scheduled. He completed cycle 4 day 1 gemcitabine/cisplatin/abraxane on 05/13/19 and day 8 on 8/20; abraxane was held on day 8. He then underwent restaging CT and f/u with [facility] with Dr. Carlis Abbott who recommended to continue chemo. He plans to f/u again in 07/2019. He notes some improvement in his energy after last cycle but still very fatigued. He can do ADLs and some light housework and the occasional errand, but no energy for much else. He hasn't felt like working or playing music in a long time. His appetite is good most of the time. Denies mucositis. Intermittent mild nausea is controlled with home meds. Bowels are irregular at baseline, no diarrhea. Drain is intact, no leaking. Output is minimal, clear. He has sharp pain "in the tube" when he yawns or coughs, then resolves. Denies chest pain or dyspnea. Denies fever, chills. Takes oxycodone one tab every couple of days. Denies neuropathy.    MEDICAL HISTORY:  Past Medical History:  Diagnosis Date   Diabetes mellitus type I, controlled ([place])    Dyslipidemia    ED (erectile  dysfunction)    Essential hypertension    Gilbert's disease    Hepatitis B carrier ([place])     SURGICAL HISTORY: Past Surgical History:  Procedure Laterality Date   APPENDECTOMY  1964   BILIARY BRUSHING  01/21/2019   Procedure: BILIARY BRUSHING;  Surgeon: Carol Ada, MD;  Location: [facility];  Service: Endoscopy;;   BILIARY STENT PLACEMENT N/A 01/21/2019   Procedure: BILIARY STENT PLACEMENT;  Surgeon: Carol Ada, MD;  Location: [facility];  Service: Endoscopy;  Laterality: N/A;   ENDOSCOPIC RETROGRADE CHOLANGIOPANCREATOGRAPHY (ERCP) WITH PROPOFOL N/A 01/21/2019   Procedure: ENDOSCOPIC RETROGRADE CHOLANGIOPANCREATOGRAPHY (ERCP) WITH PROPOFOL;  Surgeon: Carol Ada, MD;  Location: [facility];  Service: Endoscopy;  Laterality: N/A;   ESOPHAGOGASTRODUODENOSCOPY (EGD) WITH PROPOFOL N/A 01/21/2019   Procedure: ESOPHAGOGASTRODUODENOSCOPY (EGD) WITH PROPOFOL;  Surgeon: Carol Ada, MD;  Location: [facility];  Service: Endoscopy;  Laterality: N/A;   FEMORAL HERNIA REPAIR     IR CHOLANGIOGRAM EXISTING TUBE  05/13/2019   IR EXCHANGE BILIARY DRAIN  05/18/2019   IR IMAGING GUIDED PORT INSERTION  03/08/2019   IR PERC CHOLECYSTOSTOMY  05/04/2019   LEFT HEART CATH AND CORONARY ANGIOGRAPHY N/A 05/19/2017   Procedure: LEFT HEART CATH AND CORONARY ANGIOGRAPHY;  Surgeon: Leonie Man, MD;  Location: [place] [facility];  Service: Cardiovascular;  Laterality: N/A;   SPHINCTEROTOMY  01/21/2019   Procedure: SPHINCTEROTOMY;  Surgeon: Carol Ada, MD;  Location: [facility];  Service: Endoscopy;;  UPPER ESOPHAGEAL ENDOSCOPIC ULTRASOUND (EUS) N/A 01/21/2019   Procedure: UPPER ESOPHAGEAL ENDOSCOPIC ULTRASOUND (EUS);  Surgeon: Carol Ada, MD;  Location: [facility];  Service: Endoscopy;  Laterality: N/A;    I have reviewed the social history and family history with the patient and they are unchanged from previous note.  ALLERGIES:  is allergic to erythromycin.  MEDICATIONS:    Current Outpatient Medications  Medication Sig Dispense Refill   amLODipine (NORVASC) 5 MG tablet Take 5 mg by mouth daily.      atenolol (TENORMIN) 25 MG tablet Take by mouth daily. Once a day     atorvastatin (LIPITOR) 10 MG tablet Take 10 mg by mouth daily.     Blood Glucose Monitoring Suppl (ACCU-CHEK NANO SMARTVIEW) W/DEVICE KIT 1 kit by Does not apply route 2 (two) times daily. (Patient taking differently: 1 kit by Does not apply route See admin instructions. Test blood sugars 12x's daily) 1 kit 0   glucose blood test strip Test 3 times a day. (Patient taking differently: 1 each by Other route See admin instructions. Test 12 times a day.) 300 each Prn   insulin NPH Human (NOVOLIN N) 100 UNIT/ML injection Inject 20 Units into the skin 2 (two) times a day.      insulin regular (NOVOLIN R) 100 units/mL injection Inject 0-40 Units into the skin 3 (three) times daily before meals. Sliding scale 0-40 units     lidocaine-prilocaine (EMLA) cream Apply to affected area once 30 g 3   lisinopril (ZESTRIL) 20 MG tablet Take 20 mg by mouth daily.      magnesium oxide (MAG-OX) 400 MG tablet TAKE 1 TABLET BY MOUTH EVERY DAY 90 tablet 0   metFORMIN (GLUCOPHAGE) 1000 MG tablet Take 2,000 mg by mouth every evening.      ondansetron (ZOFRAN) 8 MG tablet TAKE 1 TABLET BY MOUTH 2 TIMES DAY AS NEEDED. START ON 3RD DAY AFTER CHEMOTHERAPY (Patient taking differently: Take 8 mg by mouth 2 (two) times daily as needed. Start on 3rd day after chemotherapy) 30 tablet 1   oxycodone (OXY-IR) 5 MG capsule Take 1 capsule (5 mg total) by mouth every 6 (six) hours as needed for pain. 60 capsule 0   prochlorperazine (COMPAZINE) 10 MG tablet TAKE 1 TABLET (10 MG TOTAL) BY MOUTH EVERY 6 (SIX) HOURS AS NEEDED (NAUSEA OR VOMITING). (Patient taking differently: Take 10 mg by mouth every 6 (six) hours as needed for nausea or vomiting (Nausea or vomiting). ) 30 tablet 1   zolpidem (AMBIEN) 5 MG tablet Take 1-2 tablets  (5-10 mg total) by mouth at bedtime as needed for sleep. 60 tablet 0   No current facility-administered medications for this visit.     PHYSICAL EXAMINATION: ECOG PERFORMANCE STATUS: 2 - Symptomatic, <50% confined to bed  Vitals:   06/03/19 0808  BP: 135/79  Pulse: 87  Resp: 17  Temp: 98.5 F (36.9 C)  SpO2: 100%   Filed Weights   06/03/19 0808  Weight: 146 lb 12.8 oz (66.6 kg)    GENERAL:alert, no distress and comfortable SKIN: no rash  EYES: sclera clear OROPHARYNX: no thrush or ulcers LUNGS: clear to auscultation with normal breathing effort HEART: regular rate & rhythm, no lower extremity edema ABDOMEN:abdomen soft, non-tender and normal bowel sounds. Drainage tube intact, dressing c/d/i; scant amount of clear output in collection bag. Musculoskeletal:no cyanosis of digits and no clubbing  NEURO: alert & oriented x 3 with fluent speech, normal gait PAC without erythema  LABORATORY DATA:  I have reviewed the data as listed CBC Latest Ref Rng & Units 05/20/2019 05/13/2019 05/07/2019  WBC 4.0 - 10.5 K/uL 6.5 7.5 -  Hemoglobin 13.0 - 17.0 g/dL 9.8(L) 10.1(L) 9.5(L)  Hematocrit 39.0 - 52.0 % 29.8(L) 30.8(L) 28.5(L)  Platelets 150 - 400 K/uL 353 528(H) -     CMP Latest Ref Rng & Units 05/20/2019 05/13/2019 05/05/2019  Glucose 70 - 99 mg/dL 225(H) 212(H) 180(H)  BUN 8 - 23 mg/dL _0 Creatinine 0.61 - 1.24 mg/dL 1.21 1.32(H) 0.96  Sodium 135 - 145 mmol/L 133(L) 136 135  Potassium 3.5 - 5.1 mmol/L 4.7 4.4 3.7  Chloride 98 - 111 mmol/L 99 101 105  CO2 22 - 32 mmol/L _1 Calcium 8.9 - 10.3 mg/dL 9.1 9.3 7.7(L)  Total Protein 6.5 - 8.1 g/dL 6.8 6.6 5.4(L)  Total Bilirubin 0.3 - 1.2 mg/dL 0.3 0.3 0.6  Alkaline Phos 38 - 126 U/L 52 52 46  AST 15 - 41 U/L _2 ALT 0 - 44 U/L 20 33 26      RADIOGRAPHIC STUDIES: I have personally reviewed the radiological images as listed and agreed with the findings in the report. No results found.   ASSESSMENT & PLAN:  Christopher Welch is a 73 y.o. male with   1. Extrahepatic cholangiocarcinoma, cTxN0M0 -He wasdiagnosedin 12/2018; presented with malignant stricture and CBD obstruction required stenting. Brushing revealed adenocarcinoma. He underwent attempted whipple per Dr. Zenia Resides at [facility] which was aborted due to vascular invasion -He was referred to our clinic to initiate chemotherapy in the neoadjuvant setting. -He proceeded with neoadjuvantgemcitabine and cisplatin on days 1 and 8 every 21 daysstarting 03/12/19. Abraxane added with cycle 2.AddedGCSF ([name])to day 9 startingcycle 2. -Hetolerated first few cycles very well without significant side effects; tumor marker CA 19.9dropped significantly as he started chemo, indicating good response to chemo treatment -his performance status decreased lately, abraxane was held with cycle 4 day 8.  -He underwent restaging CT at request [place] on 05/24/2019 which was overall stable, no new or progressive metastatic disease.  Dr. Robina Ade recommended to continue chemotherapy.  Reviewed pathology and also recommends to continue chemo.    2. Acute cholecystitis -Hospitalizedon 05/03/19; s/p percutaneous gallbladder drainage tube placed by IR8/5/20. -completed antibiotics -tube exchanged 8/18 due to leakage which has resolved -abdominal pain nearly resolved, he takes oxycodone PRN- usually once every couple days   3. Weight lossand low appetite -secondary to malignancyand chemo -he presented with 20 lbs weight loss  -We reviewed nutrition support withGlucerna -Continue to f/u with dietician -His appetitehas dropped significantly with recent cholecystitis. He was able to maintain weight. -continues carnation and boost supplements  4. DM -managed by PCP Dr. Theda Sers -he has had DM for 20 years, he is compliant with regimen and knows how to adjust BG -on insulin, he knows how to adjust dose based on his BG level  5. CAD, HL, HTN -followed by  cardiologist Dr. Ellyn Hack -on amlodipine, atenolol, lisinopril, and statin -BP 135/79 today  6. Social support -he is single, no children, he lives alone -He has a good friend who checks on him -previously referred to [facility]  7.Insomnia -He is able to fall asleep but has trouble staying asleep -He has tried OTC Z-quil which only moderately help.  -on Ambien PRM  Disposition: Mr. Pokorny appears stable. He completed 4 cycles gemcitabine and cisplatin. Abraxane was added with cycle 2, he had tolerated chemo very well initially  without significant toxicities. Abraxane was held on cycle 4 day 8 due to decreased performance status. Fatigue has not improved much, but he remains functional.   He underwent restaging CT at [facility] which was stable, no new or progressive disease. There is evidence of subtle vascular contour deformity involving the hepatic artery, could be contact with the tumor versus posttreatment change.  Dr. Carlis Abbott recommended to continue chemo, with next restaging and f/u early November. Labs today are pending. If adequate, will proceed with cycle 5 day 1 cisplatin and gemcitabine. Will hold abraxane today due to decreased performance status, mainly fatigue. He will return for f/u and day 8 next week. I discussed the plan with Dr. Burr Medico who has reviewed imaging and also recommends to continue treatment.   All questions were answered. The patient knows to call the clinic with any problems, questions or concerns. No barriers to learning was detected. I spent 20 minutes counseling the patient face to face. The total time spent in the appointment was 25 minutes and more than 50% was on counseling and review of test results     Alla Feeling, NP 06/03/19

## 2019-06-03 ENCOUNTER — Inpatient Hospital Stay (HOSPITAL_BASED_OUTPATIENT_CLINIC_OR_DEPARTMENT_OTHER): Payer: Medicare Other | Admitting: Nurse Practitioner

## 2019-06-03 ENCOUNTER — Ambulatory Visit: Payer: Medicare Other | Admitting: Nurse Practitioner

## 2019-06-03 ENCOUNTER — Inpatient Hospital Stay: Payer: Medicare Other

## 2019-06-03 ENCOUNTER — Telehealth: Payer: Self-pay | Admitting: Nurse Practitioner

## 2019-06-03 ENCOUNTER — Other Ambulatory Visit: Payer: Medicare Other

## 2019-06-03 ENCOUNTER — Other Ambulatory Visit: Payer: Self-pay

## 2019-06-03 ENCOUNTER — Encounter: Payer: Self-pay | Admitting: Nurse Practitioner

## 2019-06-03 ENCOUNTER — Inpatient Hospital Stay: Payer: Medicare Other | Attending: Hematology

## 2019-06-03 ENCOUNTER — Ambulatory Visit: Payer: Medicare Other

## 2019-06-03 VITALS — BP 135/79 | HR 87 | Temp 98.5°F | Resp 17 | Ht 67.0 in | Wt 146.8 lb

## 2019-06-03 DIAGNOSIS — Z5111 Encounter for antineoplastic chemotherapy: Secondary | ICD-10-CM | POA: Insufficient documentation

## 2019-06-03 DIAGNOSIS — M25511 Pain in right shoulder: Secondary | ICD-10-CM | POA: Insufficient documentation

## 2019-06-03 DIAGNOSIS — E785 Hyperlipidemia, unspecified: Secondary | ICD-10-CM | POA: Insufficient documentation

## 2019-06-03 DIAGNOSIS — Z79899 Other long term (current) drug therapy: Secondary | ICD-10-CM | POA: Diagnosis not present

## 2019-06-03 DIAGNOSIS — J90 Pleural effusion, not elsewhere classified: Secondary | ICD-10-CM | POA: Insufficient documentation

## 2019-06-03 DIAGNOSIS — I2510 Atherosclerotic heart disease of native coronary artery without angina pectoris: Secondary | ICD-10-CM | POA: Diagnosis not present

## 2019-06-03 DIAGNOSIS — E860 Dehydration: Secondary | ICD-10-CM | POA: Diagnosis not present

## 2019-06-03 DIAGNOSIS — C221 Intrahepatic bile duct carcinoma: Secondary | ICD-10-CM

## 2019-06-03 DIAGNOSIS — R05 Cough: Secondary | ICD-10-CM | POA: Diagnosis not present

## 2019-06-03 DIAGNOSIS — Z20828 Contact with and (suspected) exposure to other viral communicable diseases: Secondary | ICD-10-CM | POA: Diagnosis not present

## 2019-06-03 DIAGNOSIS — R0602 Shortness of breath: Secondary | ICD-10-CM | POA: Insufficient documentation

## 2019-06-03 DIAGNOSIS — B181 Chronic viral hepatitis B without delta-agent: Secondary | ICD-10-CM | POA: Diagnosis not present

## 2019-06-03 DIAGNOSIS — I10 Essential (primary) hypertension: Secondary | ICD-10-CM | POA: Insufficient documentation

## 2019-06-03 DIAGNOSIS — C240 Malignant neoplasm of extrahepatic bile duct: Secondary | ICD-10-CM | POA: Diagnosis not present

## 2019-06-03 DIAGNOSIS — Z794 Long term (current) use of insulin: Secondary | ICD-10-CM | POA: Insufficient documentation

## 2019-06-03 DIAGNOSIS — N179 Acute kidney failure, unspecified: Secondary | ICD-10-CM | POA: Diagnosis not present

## 2019-06-03 DIAGNOSIS — R0600 Dyspnea, unspecified: Secondary | ICD-10-CM | POA: Diagnosis not present

## 2019-06-03 DIAGNOSIS — G4700 Insomnia, unspecified: Secondary | ICD-10-CM | POA: Insufficient documentation

## 2019-06-03 DIAGNOSIS — E1065 Type 1 diabetes mellitus with hyperglycemia: Secondary | ICD-10-CM | POA: Insufficient documentation

## 2019-06-03 DIAGNOSIS — R6883 Chills (without fever): Secondary | ICD-10-CM | POA: Diagnosis not present

## 2019-06-03 DIAGNOSIS — R5383 Other fatigue: Secondary | ICD-10-CM | POA: Diagnosis not present

## 2019-06-03 DIAGNOSIS — Z7984 Long term (current) use of oral hypoglycemic drugs: Secondary | ICD-10-CM | POA: Diagnosis not present

## 2019-06-03 DIAGNOSIS — R064 Hyperventilation: Secondary | ICD-10-CM | POA: Insufficient documentation

## 2019-06-03 DIAGNOSIS — Z452 Encounter for adjustment and management of vascular access device: Secondary | ICD-10-CM | POA: Insufficient documentation

## 2019-06-03 DIAGNOSIS — R634 Abnormal weight loss: Secondary | ICD-10-CM | POA: Insufficient documentation

## 2019-06-03 LAB — CBC WITH DIFFERENTIAL (CANCER CENTER ONLY)
Abs Immature Granulocytes: 0.04 10*3/uL (ref 0.00–0.07)
Basophils Absolute: 0 10*3/uL (ref 0.0–0.1)
Basophils Relative: 0 %
Eosinophils Absolute: 0.1 10*3/uL (ref 0.0–0.5)
Eosinophils Relative: 1 %
HCT: 30.4 % — ABNORMAL LOW (ref 39.0–52.0)
Hemoglobin: 9.8 g/dL — ABNORMAL LOW (ref 13.0–17.0)
Immature Granulocytes: 0 %
Lymphocytes Relative: 20 %
Lymphs Abs: 1.9 10*3/uL (ref 0.7–4.0)
MCH: 29.6 pg (ref 26.0–34.0)
MCHC: 32.2 g/dL (ref 30.0–36.0)
MCV: 91.8 fL (ref 80.0–100.0)
Monocytes Absolute: 1.7 10*3/uL — ABNORMAL HIGH (ref 0.1–1.0)
Monocytes Relative: 17 %
Neutro Abs: 5.9 10*3/uL (ref 1.7–7.7)
Neutrophils Relative %: 62 %
Platelet Count: 331 10*3/uL (ref 150–400)
RBC: 3.31 MIL/uL — ABNORMAL LOW (ref 4.22–5.81)
RDW: 17.2 % — ABNORMAL HIGH (ref 11.5–15.5)
WBC Count: 9.6 10*3/uL (ref 4.0–10.5)
nRBC: 0 % (ref 0.0–0.2)

## 2019-06-03 LAB — CMP (CANCER CENTER ONLY)
ALT: 12 U/L (ref 0–44)
AST: 13 U/L — ABNORMAL LOW (ref 15–41)
Albumin: 3.2 g/dL — ABNORMAL LOW (ref 3.5–5.0)
Alkaline Phosphatase: 65 U/L (ref 38–126)
Anion gap: 8 (ref 5–15)
BUN: 20 mg/dL (ref 8–23)
CO2: 27 mmol/L (ref 22–32)
Calcium: 9.2 mg/dL (ref 8.9–10.3)
Chloride: 99 mmol/L (ref 98–111)
Creatinine: 1.2 mg/dL (ref 0.61–1.24)
GFR, Est AFR Am: >60 mL/min (ref >60)
GFR, Estimated: >60 mL/min (ref >60)
Glucose, Bld: 187 mg/dL — ABNORMAL HIGH (ref 70–99)
Potassium: 4.3 mmol/L (ref 3.5–5.1)
Sodium: 134 mmol/L — ABNORMAL LOW (ref 135–145)
Total Bilirubin: 0.4 mg/dL (ref 0.3–1.2)
Total Protein: 6.8 g/dL (ref 6.5–8.1)

## 2019-06-03 LAB — MAGNESIUM: Magnesium: 2 mg/dL (ref 1.7–2.4)

## 2019-06-03 MED ORDER — SODIUM CHLORIDE 0.9 % IV SOLN
Freq: Once | INTRAVENOUS | Status: AC
  Administered 2019-06-03: 10:00:00 via INTRAVENOUS
  Filled 2019-06-03: qty 1000

## 2019-06-03 MED ORDER — SODIUM CHLORIDE 0.9 % IV SOLN
1000.0000 mg/m2 | Freq: Once | INTRAVENOUS | Status: AC
  Administered 2019-06-03: 14:00:00 1786 mg via INTRAVENOUS
  Filled 2019-06-03: qty 46.97

## 2019-06-03 MED ORDER — SODIUM CHLORIDE 0.9% FLUSH
10.0000 mL | INTRAVENOUS | Status: DC | PRN
  Administered 2019-06-03: 17:00:00 10 mL
  Filled 2019-06-03: qty 10

## 2019-06-03 MED ORDER — SODIUM CHLORIDE 0.9 % IV SOLN
Freq: Once | INTRAVENOUS | Status: AC
  Administered 2019-06-03: 12:00:00 via INTRAVENOUS
  Filled 2019-06-03: qty 5

## 2019-06-03 MED ORDER — PALONOSETRON HCL INJECTION 0.25 MG/5ML
INTRAVENOUS | Status: AC
  Filled 2019-06-03: qty 5

## 2019-06-03 MED ORDER — PALONOSETRON HCL INJECTION 0.25 MG/5ML
0.2500 mg | Freq: Once | INTRAVENOUS | Status: AC
  Administered 2019-06-03: 0.25 mg via INTRAVENOUS

## 2019-06-03 MED ORDER — SODIUM CHLORIDE 0.9 % IV SOLN
Freq: Once | INTRAVENOUS | Status: AC
  Administered 2019-06-03: 10:00:00 via INTRAVENOUS
  Filled 2019-06-03: qty 250

## 2019-06-03 MED ORDER — SODIUM CHLORIDE 0.9 % IV SOLN
25.0000 mg/m2 | Freq: Once | INTRAVENOUS | Status: AC
  Administered 2019-06-03: 13:00:00 45 mg via INTRAVENOUS
  Filled 2019-06-03: qty 45

## 2019-06-03 MED ORDER — SODIUM CHLORIDE 0.9 % IV SOLN
Freq: Once | INTRAVENOUS | Status: AC
  Filled 2019-06-03: qty 250

## 2019-06-03 MED ORDER — HEPARIN SOD (PORK) LOCK FLUSH 100 UNIT/ML IV SOLN
500.0000 [IU] | Freq: Once | INTRAVENOUS | Status: AC | PRN
  Administered 2019-06-03: 17:00:00 500 [IU]
  Filled 2019-06-03: qty 5

## 2019-06-03 NOTE — Telephone Encounter (Signed)
Scheduled appt per 9/3 los. °

## 2019-06-03 NOTE — Patient Instructions (Signed)
[facility] Discharge Instructions for Patients Receiving Chemotherapy  Today you received the following chemotherapy agents Gemzar and Cisplatin  To help prevent nausea and vomiting after your treatment, we encourage you to take your nausea medication as directed   If you develop nausea and vomiting that is not controlled by your nausea medication, call the clinic.   BELOW ARE SYMPTOMS THAT SHOULD BE REPORTED IMMEDIATELY:  *FEVER GREATER THAN 100.5 F  *CHILLS WITH OR WITHOUT FEVER  NAUSEA AND VOMITING THAT IS NOT CONTROLLED WITH YOUR NAUSEA MEDICATION  *UNUSUAL SHORTNESS OF BREATH  *UNUSUAL BRUISING OR BLEEDING  TENDERNESS IN MOUTH AND THROAT WITH OR WITHOUT PRESENCE OF ULCERS  *URINARY PROBLEMS  *BOWEL PROBLEMS  UNUSUAL RASH Items with * indicate a potential emergency and should be followed up as soon as possible.  Feel free to call the clinic should you have any questions or concerns. The clinic phone number is [phone].  Please show the CHEMO ALERT CARD at check-in to the Emergency Department and triage nurse.   

## 2019-06-04 ENCOUNTER — Ambulatory Visit: Payer: Medicare Other

## 2019-06-04 LAB — CANCER ANTIGEN 19-9: CA 19-9: 40 U/mL — ABNORMAL HIGH (ref 0–35)

## 2019-06-04 NOTE — Progress Notes (Signed)
[place]   Telephone:([phone]) [phone] Fax:([phone]) [phone]   Clinic Follow up Note   Patient Care Team: [name], DO as PCP - General (Family Medicine)  Date of Service:  06/10/2019  CHIEF COMPLAINT: F/u of ExtrahepaticCholangiocarcinoma  SUMMARY OF ONCOLOGIC HISTORY: Oncology History  Cholangiocarcinoma ([place])  01/14/2019 Imaging   US Abdomen 01/14/19  IMPRESSION: There is significant intrahepatic and extrahepatic biliary dilatation present, concerning for distal common bile duct obstruction. Further evaluation with CT scan or MRCP is recommended. Correlation with liver function tests is recommended as well. These results will be called to the ordering clinician or representative by the Radiologist Assistant, and communication documented in the PACS or zVision Dashboard.   Probable large amount of sludge seen within gallbladder lumen with mild gallbladder wall thickening. However, no cholelithiasis, pericholecystic fluid or sonographic Murphy's sign is noted.   4.2 cm septated cyst seen in upper pole of right kidney consistent with Bosniak type 2 lesion. Follow-up ultrasound in 1 year is recommended to ensure stability.   01/20/2019 Imaging   MRI abdomen 01/20/19  IMPRESSION: 1. Findings are highly concerning for central tumor in the biliary tract at the confluence of the common hepatic duct, cystic duct and common bile ducts. Further clinical evaluation for potential cholangiocarcinoma is strongly recommended. 2. Mild ductal dilatation of the main pancreatic duct throughout the distal body and tail of the pancreas where there is also some associated side branch ectasia. This may suggest a pancreatic ductal stricture. No obstructing pancreatic neoplasm identified. 3. Aortic atherosclerosis.   01/21/2019 Initial Biopsy   Diagnosis 01/21/19  BILE DUCT BRUSHING (SPECIMEN 1 OF 1 COLLECTED 01/21/2019) ADENOCARCINOMA.   01/21/2019 Procedure   ERCP By Dr hung  01/21/19 IMRPESSION - The major papilla appeared normal. - A single localized biliary stricture was found in the middle third of the main bile duct. - The entire main bile duct and upper third of the main bile duct were dilated, secondary to a stricture. - A biliary sphincterotomy was performed. - Cells for cytology obtained in the middle third of the main bile duct. - One plastic stent was placed into the common bile duct.  EUS by Dr hung 01/21/19  IMPRESSION - There was dilation in the middle third of the main bile duct and in the upper third of the main bile duct which measured up to 15 mm. - There was a suggestion of a stricture in the middle third of the main bile duct. - No specimens collected.   02/03/2019 Imaging   CT CAP at [facility] 02/03/19  Impression:  1. There is mild intrahepatic and extrahepatic biliary ductal dilation and diffuse common bile duct wall thickening with stent in place. There is abnormal soft tissue measuring approximately 1.6 cm between the common hepatic artery, portal vein and common bile duct, worrisome for the known cholangiocarcinoma.  2. Periportal lymphadenopathy which may represent nodal metastasis.  4. Marked pancreatic atrophy and duct dilation involving the distal body and tail the pancreas where there is a coarse calcification. These findings are favored to represent stenosis from intraductal stones though an underlying stricture cannot be completely excluded.  5. Atypical symmetric prominent fat stranding of the bilateral lower abdominal wall. Correlate with surgical history or history of history of trauma.   03/08/2019 Initial Diagnosis   Cholangiocarcinoma ([place])   03/12/2019 -  Chemotherapy   gemcitabine and cisplatin on days 1 and 8 every 21 days starting 03/12/19. Abraxane added with cycle 2. Added GCSF [name]) to  day 9 starting cycle 2.    03/19/2019 Cancer Staging   Staging form: Perihilar Bile Ducts, AJCC 8th Edition - Clinical:  Stage Unknown (cTX, cN0, cM0) - Signed by [name], MD on 03/19/2019   05/24/2019 Imaging   CT CAP W Contrast at [facility]  1.  Interval placement of covered metallic biliary stent. No progressive dilation of the biliary tree and resulting resolution of the prior main pancreatic duct dilation. 2.  Evidence of some subtle vascular contour deformity involving the hepatic artery, suspected to be from tumoral contact and/or posttreatment changes. 3.  New small right pleural effusion with overlying mild airspace disease. Secondary findings which may indicate a component of aspiration. 4.  No convincing evidence of new disseminated metastatic disease. 5.  Additional findings as discussed above.      CURRENT THERAPY:  gemcitabine and cisplatin on days 1 and 8 every 21 daysstarting 03/12/19. Abraxane added with cycle 2.AddedGCSF ([name])to day 9 startingcycle 2.  INTERVAL HISTORY:  Christopher Welch is here for a follow up and treatment. He presents to the clinic alone.  He tolerated chemotherapy moderately well last week, did have moderate fatigue and nausea, no vomiting.  He was evaluated in IR 2 days ago for leakage of his gallbladder drainage tube, which was removed, but replacement of a new draining tube was unsuccessful due to the collapse of his gallbladder.  He experienced quite a bit of pain during the procedure, no more leakage or bleeding from the tube site after the procedure.  He complains of severe pain when he change his position, or turns, but pain is mild and then tolerable when he sits or lays down.  No fever, chills, or other new symptoms.  He has been quite fatigued, with low appetite, food, but does drink protein shake, Carnation breakfast and boost about 2 bottles a day.  He has mild dizziness when he stands only drinks 8 ounces liquid a day.   REVIEW OF SYSTEMS:   Constitutional: Denies fevers, chills or abnormal weight loss, (+) fatigue  Eyes: Denies blurriness of vision Ears,  nose, mouth, throat, and face: Denies mucositis or sore throat Respiratory: Denies cough, dyspnea or wheezes Cardiovascular: Denies palpitation, chest discomfort or lower extremity swelling Gastrointestinal:  See HPI  Skin: Denies abnormal skin rashes Lymphatics: Denies new lymphadenopathy or easy bruising Neurological:Denies numbness, tingling or new weaknesses Behavioral/Psych: Mood is stable, no new changes  All other systems were reviewed with the patient and are negative.  MEDICAL HISTORY:  Past Medical History:  Diagnosis Date  . Diabetes mellitus type I, controlled ([place])   . Dyslipidemia   . ED (erectile dysfunction)   . Essential hypertension   . Gilbert's disease   . Hepatitis B carrier [facility])     SURGICAL HISTORY: Past Surgical History:  Procedure Laterality Date  . APPENDECTOMY  1964  . BILIARY BRUSHING  01/21/2019   Procedure: BILIARY BRUSHING;  Surgeon: Carol Ada, MD;  Location: [facility];  Service: Endoscopy;;  . BILIARY STENT PLACEMENT N/A 01/21/2019   Procedure: BILIARY STENT PLACEMENT;  Surgeon: Carol Ada, MD;  Location: [facility];  Service: Endoscopy;  Laterality: N/A;  . ENDOSCOPIC RETROGRADE CHOLANGIOPANCREATOGRAPHY (ERCP) WITH PROPOFOL N/A 01/21/2019   Procedure: ENDOSCOPIC RETROGRADE CHOLANGIOPANCREATOGRAPHY (ERCP) WITH PROPOFOL;  Surgeon: Carol Ada, MD;  Location: [facility];  Service: Endoscopy;  Laterality: N/A;  . ESOPHAGOGASTRODUODENOSCOPY (EGD) WITH PROPOFOL N/A 01/21/2019   Procedure: ESOPHAGOGASTRODUODENOSCOPY (EGD) WITH PROPOFOL;  Surgeon: Carol Ada, MD;  Location: [facility];  Service:  Endoscopy;  Laterality: N/A;  . FEMORAL HERNIA REPAIR    . IR CHOLANGIOGRAM EXISTING TUBE  05/13/2019  . IR CHOLANGIOGRAM EXISTING TUBE  06/08/2019  . IR EXCHANGE BILIARY DRAIN  05/18/2019  . IR EXCHANGE BILIARY DRAIN  06/08/2019  . IR IMAGING GUIDED PORT INSERTION  03/08/2019  . IR PERC CHOLECYSTOSTOMY  05/04/2019  . LEFT HEART CATH AND CORONARY  ANGIOGRAPHY N/A 05/19/2017   Procedure: LEFT HEART CATH AND CORONARY ANGIOGRAPHY;  Surgeon: Leonie Man, MD;  Location: [facility];  Service: Cardiovascular;  Laterality: N/A;  . SPHINCTEROTOMY  01/21/2019   Procedure: SPHINCTEROTOMY;  Surgeon: Carol Ada, MD;  Location: [facility];  Service: Endoscopy;;  . UPPER ESOPHAGEAL ENDOSCOPIC ULTRASOUND (EUS) N/A 01/21/2019   Procedure: UPPER ESOPHAGEAL ENDOSCOPIC ULTRASOUND (EUS);  Surgeon: Carol Ada, MD;  Location: [facility];  Service: Endoscopy;  Laterality: N/A;    I have reviewed the social history and family history with the patient and they are unchanged from previous note.  ALLERGIES:  is allergic to erythromycin.  MEDICATIONS:  Current Outpatient Medications  Medication Sig Dispense Refill  . amLODipine (NORVASC) 5 MG tablet Take 5 mg by mouth daily.     Marland Kitchen atenolol (TENORMIN) 25 MG tablet Take by mouth daily. Once a day    . atorvastatin (LIPITOR) 10 MG tablet Take 10 mg by mouth daily.    . Blood Glucose Monitoring Suppl (ACCU-CHEK NANO SMARTVIEW) W/DEVICE KIT 1 kit by Does not apply route 2 (two) times daily. (Patient taking differently: 1 kit by Does not apply route See admin instructions. Test blood sugars 12x's daily) 1 kit 0  . glucose blood test strip Test 3 times a day. (Patient taking differently: 1 each by Other route See admin instructions. Test 12 times a day.) 300 each Prn  . insulin NPH Human (NOVOLIN N) 100 UNIT/ML injection Inject 20 Units into the skin 2 (two) times a day.     . insulin regular (NOVOLIN R) 100 units/mL injection Inject 0-40 Units into the skin 3 (three) times daily before meals. Sliding scale 0-40 units    . lidocaine-prilocaine (EMLA) cream Apply to affected area once 30 g 3  . lisinopril (ZESTRIL) 20 MG tablet Take 20 mg by mouth daily.     . magnesium oxide (MAG-OX) 400 MG tablet TAKE 1 TABLET BY MOUTH EVERY DAY 90 tablet 0  . metFORMIN (GLUCOPHAGE) 1000 MG tablet Take 2,000 mg by  mouth every evening.     . ondansetron (ZOFRAN) 8 MG tablet TAKE 1 TABLET BY MOUTH 2 TIMES DAY AS NEEDED. START ON 3RD DAY AFTER CHEMOTHERAPY (Patient taking differently: Take 8 mg by mouth 2 (two) times daily as needed. Start on 3rd day after chemotherapy) 30 tablet 1  . oxycodone (OXY-IR) 5 MG capsule Take 1 capsule (5 mg total) by mouth every 6 (six) hours as needed for pain. 60 capsule 0  . prochlorperazine (COMPAZINE) 10 MG tablet TAKE 1 TABLET (10 MG TOTAL) BY MOUTH EVERY 6 (SIX) HOURS AS NEEDED (NAUSEA OR VOMITING). (Patient taking differently: Take 10 mg by mouth every 6 (six) hours as needed for nausea or vomiting (Nausea or vomiting). ) 30 tablet 1  . zolpidem (AMBIEN) 5 MG tablet Take 1-2 tablets (5-10 mg total) by mouth at bedtime as needed for sleep. 60 tablet 0   Current Facility-Administered Medications  Medication Dose Route Frequency Provider Last Rate Last Dose  . 0.9 %  sodium chloride infusion   Intravenous Continuous [name],  MD        PHYSICAL EXAMINATION: ECOG PERFORMANCE STATUS: 2 - Symptomatic, <50% confined to bed  Vitals:   06/10/19 0842  BP: 121/68  Pulse: 81  Resp: 18  Temp: 97.8 F (36.6 C)  SpO2: 99%   Filed Weights   06/10/19 0842  Weight: 142 lb 8 oz (64.6 kg)   GENERAL:alert, no distress and comfortable SKIN: skin color, texture, turgor are normal, no rashes or significant lesions EYES: normal, Conjunctiva are pink and non-injected, sclera clear NECK: supple, thyroid normal size, non-tender, without nodularity LYMPH:  no palpable lymphadenopathy in the cervical, axillary  LUNGS: clear to auscultation and percussion with normal breathing effort HEART: regular rate & rhythm and no murmurs and no lower extremity edema ABDOMEN:abdomen soft, non-tender and normal bowel sounds, he developed severe abdominal pain when he lays down, and not able to exam him on table. His dressing at the old PTC site is dry and clean, no noticeable hematoma or bruising.  Musculoskeletal:no cyanosis of digits and no clubbing  NEURO: alert & oriented x 3 with fluent speech, no focal motor/sensory deficits  LABORATORY DATA:  I have reviewed the data as listed CBC Latest Ref Rng & Units 06/10/2019 06/03/2019 05/20/2019  WBC 4.0 - 10.5 K/uL 8.5 9.6 6.5  Hemoglobin 13.0 - 17.0 g/dL 10.2(L) 9.8(L) 9.8(L)  Hematocrit 39.0 - 52.0 % 30.2(L) 30.4(L) 29.8(L)  Platelets 150 - 400 K/uL 317 331 353     CMP Latest Ref Rng & Units 06/10/2019 06/03/2019 05/20/2019  Glucose 70 - 99 mg/dL 290(H) 187(H) 225(H)  BUN 8 - 23 mg/dL 28(H) 20 23  Creatinine 0.61 - 1.24 mg/dL 1.48(H) 1.20 1.21  Sodium 135 - 145 mmol/L 132(L) 134(L) 133(L)  Potassium 3.5 - 5.1 mmol/L 4.4 4.3 4.7  Chloride 98 - 111 mmol/L 97(L) 99 99  CO2 22 - 32 mmol/L _0 Calcium 8.9 - 10.3 mg/dL 9.1 9.2 9.1  Total Protein 6.5 - 8.1 g/dL 6.8 6.8 6.8  Total Bilirubin 0.3 - 1.2 mg/dL 0.4 0.4 0.3  Alkaline Phos 38 - 126 U/L 59 65 52  AST 15 - 41 U/L 12(L) 13(L) 17  ALT 0 - 44 U/L _1 RADIOGRAPHIC STUDIES: I have personally reviewed the radiological images as listed and agreed with the findings in the report. Ir Cholangiogram Existing Tube  Result Date: 06/08/2019 INDICATION: 73 year old male with a history of chronic cholecystitis and cholangiocarcinoma. He is not an operative candidate. He has an indwelling percutaneous cholecystostomy tube which frequently becomes malpositioned. It is currently leaking. He presents for cholangiogram and potential tube exchange EXAM: CHOLANGIOGRAM VIA EXISTING CATHETER; IR EXCHANGE BILARY DRAIN MEDICATIONS: None ANESTHESIA/SEDATION: None FLUOROSCOPY TIME:  Fluoroscopy Time: 0 minutes 12 seconds (28 mGy). COMPLICATIONS: None immediate. PROCEDURE: Informed written consent was obtained from the patient after a thorough discussion of the procedural risks, benefits and alternatives. All questions were addressed. Maximal Sterile Barrier Technique was utilized including caps,  mask, sterile gowns, sterile gloves, sterile drape, hand hygiene and skin antiseptic. A timeout was performed prior to the initiation of the procedure. Initial fluoroscopic imaging demonstrates that the cholecystostomy tube has nearly completely pulled out of the gallbladder. There is redundant catheter just inside the ribcage heading cephalad toward the diaphragm before making an acute angle and heading inferior to the hepatic entry site. A gentle hand injection of contrast material was performed demonstrating that just the tip of the cholecystostomy tube remains within the gallbladder lumen.  The decision was made to proceed with tube exchange and attempted rescue of this nearly displaced catheter. The existing catheter was cut and a Bentson wire advanced through the catheter as the catheter was pulled back slightly to reduce the redundancy. The Bentson wire was successfully navigated into the gallbladder lumen. However, as the tube was removed over the wire, it became evident that the retention suture was a tangled with the tube in the peritoneal space. When the tube was finally successfully removed, wire access to the gallbladder lumen had been lost due to the redundancy and manipulation. An angled 5 French catheter was advanced over the wire and efforts were made to salvage the tube access. This was unsuccessful. Additional hand injection of contrast was performed through the 5 French catheter in an effort to identify the path. This was unsuccessful. The gallbladder is collapsed. Further attempts at tube exchange were aborted. IMPRESSION: 1. Cholangiogram demonstrates a collapsed gallbladder and a nearly completely displaced percutaneous cholecystostomy tube with significant redundancy of the tube in the peritoneal space between the liver edge and ribcage. 2. Unsuccessful attempted rescue/exchange of the percutaneous cholecystostomy tube. PLAN: Given that the gallbladder is completely decompressed and the tube  could not be successfully exchanged today, we made the decision to not place a new tube. We will see how this gentleman does. If he develops recurrent symptoms of cholecystitis, we will ultimately have to place a new tube. However, if his cholecystitis has resolved, he may not require tube placement in the future. I discussed this with the patient at length. We gave him a card with a number to call should he develop recurrent symptoms in the future to facilitate him coming in to have a new cholecystostomy tube placed. Signed, Criselda Peaches, MD, [facility] Radiology Electronically Signed   By: Jacqulynn Cadet M.D.   On: 06/08/2019 15:22   Ir Exchange Biliary Drain  Result Date: 06/08/2019 INDICATION: 73 year old male with a history of chronic cholecystitis and cholangiocarcinoma. He is not an operative candidate. He has an indwelling percutaneous cholecystostomy tube which frequently becomes malpositioned. It is currently leaking. He presents for cholangiogram and potential tube exchange EXAM: CHOLANGIOGRAM VIA EXISTING CATHETER; IR EXCHANGE BILARY DRAIN MEDICATIONS: None ANESTHESIA/SEDATION: None FLUOROSCOPY TIME:  Fluoroscopy Time: 0 minutes 12 seconds (28 mGy). COMPLICATIONS: None immediate. PROCEDURE: Informed written consent was obtained from the patient after a thorough discussion of the procedural risks, benefits and alternatives. All questions were addressed. Maximal Sterile Barrier Technique was utilized including caps, mask, sterile gowns, sterile gloves, sterile drape, hand hygiene and skin antiseptic. A timeout was performed prior to the initiation of the procedure. Initial fluoroscopic imaging demonstrates that the cholecystostomy tube has nearly completely pulled out of the gallbladder. There is redundant catheter just inside the ribcage heading cephalad toward the diaphragm before making an acute angle and heading inferior to the hepatic  entry site. A gentle hand injection of contrast material was performed demonstrating that just the tip of the cholecystostomy tube remains within the gallbladder lumen. The decision was made to proceed with tube exchange and attempted rescue of this nearly displaced catheter. The existing catheter was cut and a Bentson wire advanced through the catheter as the catheter was pulled back slightly to reduce the redundancy. The Bentson wire was successfully navigated into the gallbladder lumen. However, as the tube was removed over the wire, it became evident that the retention suture was a tangled with the tube in the peritoneal space.  When the tube was finally successfully removed, wire access to the gallbladder lumen had been lost due to the redundancy and manipulation. An angled 5 French catheter was advanced over the wire and efforts were made to salvage the tube access. This was unsuccessful. Additional hand injection of contrast was performed through the 5 French catheter in an effort to identify the path. This was unsuccessful. The gallbladder is collapsed. Further attempts at tube exchange were aborted. IMPRESSION: 1. Cholangiogram demonstrates a collapsed gallbladder and a nearly completely displaced percutaneous cholecystostomy tube with significant redundancy of the tube in the peritoneal space between the liver edge and ribcage. 2. Unsuccessful attempted rescue/exchange of the percutaneous cholecystostomy tube. PLAN: Given that the gallbladder is completely decompressed and the tube could not be successfully exchanged today, we made the decision to not place a new tube. We will see how this gentleman does. If he develops recurrent symptoms of cholecystitis, we will ultimately have to place a new tube. However, if his cholecystitis has resolved, he may not require tube placement in the future. I discussed this with the patient at length. We gave him a card with a number to call should he develop recurrent  symptoms in the future to facilitate him coming in to have a new cholecystostomy tube placed. Signed, Criselda Peaches, MD, [place] [facility] Radiology Electronically Signed   By: Jacqulynn Cadet M.D.   On: 06/08/2019 15:22     ASSESSMENT & PLAN:  Christopher Welch is a 73 y.o. male with   1. Extrahepatic cholangiocarcinoma, cTxN0M0 -He wasdiagnosedin 12/2018. His work up revealed malignant stricture and CBD obstruction required stenting. Brushing revealed adenocarcinoma. His extrahepatic cholangiocarcinoma was not resectable due to vascular invasion(surgeon - Dr. Zenia Resides at [facility]) -He was referred to our clinic to initiate chemotherapy in the neoadjuvant setting. -He proceeded with neoadjuvantgemcitabine and cisplatin on days 1 and 8 every 21 daysstarting 03/12/19. Abraxane added with cycle 2.AddedGCSF ([name])to day 9 startingcycle 2. -Hehas beentolerating chemo verywell so far with no significant issues.  -Histumor marker CA 19.9hasdropped significantly as he started chemo, indicating good response to chemo treatment -His CT Cap on 05/24/[facility] at [facility] shows stable disease, Dr. Carlis Abbott recommended continue chemo  -He clincially improved after percutaneous gallbladder drainage tube was placed for acute cholecystitis and I restarted chemo last week.  Due to the leakage of the draining tube, it was removed on 9/8 -He has been having significant abdominal pain after the procedure 2 days ago, very fatigued, with low appetite, I will hold chemotherapy today, and give IV fluids. -We will reassess him in 1 week to see if he can restart chemo.  2. History of acute cholecystitis -He washospitalizedon 05/03/19.  -He had a percutaneous gallbladder drainage tube placed by IR8/5/20. It was removed on 9/8 due to leakage  -his current abdominal pain is probably related to the procedure, I encouraged him to use pain medication as needed -No clinical high  concern for acute cholecystitis we will continue to monitoring clinically   3. Weight lossand low appetite -secondary to malignancyand chemo -he presented with 20 lbs weight loss  -We reviewed nutrition support withGlucerna -Continue to f/u with dietician -His appetitehas dropped significantly with recent cholecystitis. He was able to maintain weight. -I encourage him to increase nutritional supplement (carnation breakfast and Boost) to 3-5 bottles a day.  4. DM -managed by PCP Dr. Theda Sers -he has had DM for 20 years, he is compliant with regimen and knows how  to adjust BG -on insulin, he knows how to adjust dose based on his BG level, but overall not well controlled, I encourage him to f/u with Dr. Theda Sers -Due to elevated BG secondary to chemo pre-meds, he has been needing to increase his insulin but working on controlling it.  5. CAD, HL, HTN -followed by cardiologist Dr. Ellyn Hack -on amlodipine, atenolol, lisinopril, and statin -HTN well controlled  6. Social support -he is single, no children, he lives alone -He has a good friend who can help him and take him to appointments  -I previously referred him to SW to assess needs - he may benefit from talking with Edwyna Shell about his stress about his diagnosis and treatment  7.Insomnia -He is able to fall asleep but has trouble staying asleep -He has tried OTC Z-quil which only moderately help.  -If not improving or worsens, I will call in Ambien.   PLAN: -Due to his significant abdominal pain related to his recent procedure, will hold chemotherapy today -Normal saline 1 L over 2 hours today, and later next week -Follow-up with dietitian -Lab, flush, follow-up and chemo in one week    No problem-specific Assessment & Plan notes found for this encounter.   No orders of the defined types were placed in this encounter.  All questions were answered. The patient knows to call the clinic with any  problems, questions or concerns. No barriers to learning was detected. I spent 20 minutes counseling the patient face to face. The total time spent in the appointment was 25 minutes and more than 50% was on counseling and review of test results     [name], MD 06/10/2019   I, Joslyn Devon, am acting as scribe for [name], MD.   I have reviewed the above documentation for accuracy and completeness, and I agree with the above.

## 2019-06-08 ENCOUNTER — Encounter (HOSPITAL_COMMUNITY): Payer: Self-pay | Admitting: Interventional Radiology

## 2019-06-08 ENCOUNTER — Ambulatory Visit (HOSPITAL_COMMUNITY): Admission: RE | Admit: 2019-06-08 | Payer: Medicare Other | Source: Ambulatory Visit

## 2019-06-08 ENCOUNTER — Other Ambulatory Visit: Payer: Self-pay | Admitting: Nurse Practitioner

## 2019-06-08 ENCOUNTER — Ambulatory Visit (HOSPITAL_COMMUNITY)
Admission: RE | Admit: 2019-06-08 | Discharge: 2019-06-08 | Disposition: A | Discharge status: Home / Self Care | Payer: Medicare Other | Source: Ambulatory Visit | Attending: Nurse Practitioner | Admitting: Nurse Practitioner

## 2019-06-08 ENCOUNTER — Telehealth: Payer: Self-pay

## 2019-06-08 ENCOUNTER — Other Ambulatory Visit: Payer: Self-pay

## 2019-06-08 DIAGNOSIS — C221 Intrahepatic bile duct carcinoma: Secondary | ICD-10-CM | POA: Diagnosis not present

## 2019-06-08 DIAGNOSIS — Z4803 Encounter for change or removal of drains: Secondary | ICD-10-CM | POA: Insufficient documentation

## 2019-06-08 DIAGNOSIS — T85520A Displacement of bile duct prosthesis, initial encounter: Secondary | ICD-10-CM | POA: Diagnosis not present

## 2019-06-08 HISTORY — PX: IR EXCHANGE BILIARY DRAIN: IMG6046

## 2019-06-08 HISTORY — PX: IR CHOLANGIOGRAM EXISTING TUBE: IMG6040

## 2019-06-08 MED ORDER — LIDOCAINE HCL 1 % IJ SOLN
INTRAMUSCULAR | Status: DC | PRN
  Administered 2019-06-08: 5 mL

## 2019-06-08 MED ORDER — IOHEXOL 300 MG/ML  SOLN
50.0000 mL | Freq: Once | INTRAMUSCULAR | Status: AC | PRN
  Administered 2019-06-08: 20 mL

## 2019-06-08 MED ORDER — IOHEXOL 300 MG/ML  SOLN: 50.0000 mL | Freq: Once | INTRAMUSCULAR | Status: DC | PRN

## 2019-06-08 MED ORDER — LIDOCAINE HCL 1 % IJ SOLN
INTRAMUSCULAR | Status: AC
  Filled 2019-06-08: qty 20

## 2019-06-08 NOTE — Telephone Encounter (Signed)
Patient calls stating his biliary drain tube is leaking all over the place.  Consulted with Cira Rue NP she ordered IR Evaluation and Management, spoke with Lanny Hurst in IR they can see the patient today at 1:00 at [facility], called patient and left him a voice message with this appointment.

## 2019-06-09 ENCOUNTER — Telehealth: Payer: Self-pay | Admitting: Student

## 2019-06-09 NOTE — Telephone Encounter (Signed)
Patient called to report pain after his procedure yesterday.  States he is having mild tenderness when bending/stretching at his procedure site.  Denies fever, chills, nausea, vomiting, abdominal pain, diarrhea, trouble eating or drinking.  Discussed this is likely post-procedural as his gall bladder tube could not be replaced after several attempts yesterday (9/8).  Recommended conservative management unless patient develops signs of his chronic cholecystitis.  Patient agrees his current discomfort is different from his typical symptoms.  States his pain is manageable with current interventions at home.   Patient instructed to seek evaluation if he develops signs/symptoms of cholecystitis.  He voices understanding.   Brynda Greathouse, MS RD PA-C

## 2019-06-10 ENCOUNTER — Ambulatory Visit: Payer: Medicare Other

## 2019-06-10 ENCOUNTER — Inpatient Hospital Stay (HOSPITAL_BASED_OUTPATIENT_CLINIC_OR_DEPARTMENT_OTHER): Payer: Medicare Other | Admitting: Hematology

## 2019-06-10 ENCOUNTER — Inpatient Hospital Stay: Payer: Medicare Other

## 2019-06-10 ENCOUNTER — Encounter: Payer: Self-pay | Admitting: Hematology

## 2019-06-10 ENCOUNTER — Other Ambulatory Visit: Payer: Self-pay

## 2019-06-10 VITALS — BP 125/68 | HR 79

## 2019-06-10 VITALS — BP 121/68 | HR 81 | Temp 97.8°F | Resp 18 | Ht 67.0 in | Wt 142.5 lb

## 2019-06-10 DIAGNOSIS — R6883 Chills (without fever): Secondary | ICD-10-CM | POA: Diagnosis not present

## 2019-06-10 DIAGNOSIS — C221 Intrahepatic bile duct carcinoma: Secondary | ICD-10-CM | POA: Diagnosis not present

## 2019-06-10 DIAGNOSIS — E119 Type 2 diabetes mellitus without complications: Secondary | ICD-10-CM

## 2019-06-10 DIAGNOSIS — R05 Cough: Secondary | ICD-10-CM | POA: Diagnosis not present

## 2019-06-10 DIAGNOSIS — Z20828 Contact with and (suspected) exposure to other viral communicable diseases: Secondary | ICD-10-CM | POA: Diagnosis not present

## 2019-06-10 DIAGNOSIS — I10 Essential (primary) hypertension: Secondary | ICD-10-CM

## 2019-06-10 DIAGNOSIS — R0602 Shortness of breath: Secondary | ICD-10-CM | POA: Diagnosis not present

## 2019-06-10 DIAGNOSIS — Z95828 Presence of other vascular implants and grafts: Secondary | ICD-10-CM

## 2019-06-10 DIAGNOSIS — R0600 Dyspnea, unspecified: Secondary | ICD-10-CM | POA: Diagnosis not present

## 2019-06-10 DIAGNOSIS — C240 Malignant neoplasm of extrahepatic bile duct: Secondary | ICD-10-CM | POA: Diagnosis not present

## 2019-06-10 LAB — CBC WITH DIFFERENTIAL (CANCER CENTER ONLY)
Abs Immature Granulocytes: 0.09 10*3/uL — ABNORMAL HIGH (ref 0.00–0.07)
Basophils Absolute: 0 10*3/uL (ref 0.0–0.1)
Basophils Relative: 0 %
Eosinophils Absolute: 0 10*3/uL (ref 0.0–0.5)
Eosinophils Relative: 0 %
HCT: 30.2 % — ABNORMAL LOW (ref 39.0–52.0)
Hemoglobin: 10.2 g/dL — ABNORMAL LOW (ref 13.0–17.0)
Immature Granulocytes: 1 %
Lymphocytes Relative: 13 %
Lymphs Abs: 1.1 10*3/uL (ref 0.7–4.0)
MCH: 30.4 pg (ref 26.0–34.0)
MCHC: 33.8 g/dL (ref 30.0–36.0)
MCV: 89.9 fL (ref 80.0–100.0)
Monocytes Absolute: 1.1 10*3/uL — ABNORMAL HIGH (ref 0.1–1.0)
Monocytes Relative: 14 %
Neutro Abs: 6.1 10*3/uL (ref 1.7–7.7)
Neutrophils Relative %: 72 %
Platelet Count: 317 10*3/uL (ref 150–400)
RBC: 3.36 MIL/uL — ABNORMAL LOW (ref 4.22–5.81)
RDW: 16.2 % — ABNORMAL HIGH (ref 11.5–15.5)
WBC Count: 8.5 10*3/uL (ref 4.0–10.5)
nRBC: 0 % (ref 0.0–0.2)

## 2019-06-10 LAB — CMP (CANCER CENTER ONLY)
ALT: 12 U/L (ref 0–44)
AST: 12 U/L — ABNORMAL LOW (ref 15–41)
Albumin: 3.1 g/dL — ABNORMAL LOW (ref 3.5–5.0)
Alkaline Phosphatase: 59 U/L (ref 38–126)
Anion gap: 10 (ref 5–15)
BUN: 28 mg/dL — ABNORMAL HIGH (ref 8–23)
CO2: 25 mmol/L (ref 22–32)
Calcium: 9.1 mg/dL (ref 8.9–10.3)
Chloride: 97 mmol/L — ABNORMAL LOW (ref 98–111)
Creatinine: 1.48 mg/dL — ABNORMAL HIGH (ref 0.61–1.24)
GFR, Est AFR Am: 54 mL/min — ABNORMAL LOW (ref >60)
GFR, Estimated: 47 mL/min — ABNORMAL LOW (ref >60)
Glucose, Bld: 290 mg/dL — ABNORMAL HIGH (ref 70–99)
Potassium: 4.4 mmol/L (ref 3.5–5.1)
Sodium: 132 mmol/L — ABNORMAL LOW (ref 135–145)
Total Bilirubin: 0.4 mg/dL (ref 0.3–1.2)
Total Protein: 6.8 g/dL (ref 6.5–8.1)

## 2019-06-10 LAB — MAGNESIUM: Magnesium: 1.8 mg/dL (ref 1.7–2.4)

## 2019-06-10 MED ORDER — ALTEPLASE 2 MG IJ SOLR
INTRAMUSCULAR | Status: AC
  Filled 2019-06-10: qty 2

## 2019-06-10 MED ORDER — SODIUM CHLORIDE 0.9% FLUSH
10.0000 mL | INTRAVENOUS | Status: DC | PRN
  Administered 2019-06-10: 10 mL
  Filled 2019-06-10: qty 10

## 2019-06-10 MED ORDER — SODIUM CHLORIDE 0.9 % IV SOLN
INTRAVENOUS | Status: DC
  Filled 2019-06-10: qty 250

## 2019-06-10 MED ORDER — SODIUM CHLORIDE 0.9 % IV SOLN
Freq: Once | INTRAVENOUS | Status: AC
  Administered 2019-06-10: 11:00:00 via INTRAVENOUS
  Filled 2019-06-10: qty 250

## 2019-06-10 MED ORDER — SODIUM CHLORIDE 0.9 % IV SOLN: 16.0000 mg | Freq: Once | INTRAVENOUS | Status: DC

## 2019-06-10 MED ORDER — HEPARIN SOD (PORK) LOCK FLUSH 100 UNIT/ML IV SOLN
500.0000 [IU] | Freq: Once | INTRAVENOUS | Status: AC | PRN
  Administered 2019-06-10: 13:00:00 500 [IU]
  Filled 2019-06-10: qty 5

## 2019-06-10 MED ORDER — ALTEPLASE 2 MG IJ SOLR
2.0000 mg | Freq: Once | INTRAMUSCULAR | Status: AC | PRN
  Administered 2019-06-10: 2 mg
  Filled 2019-06-10: qty 2

## 2019-06-10 MED ORDER — SODIUM CHLORIDE 0.9 % IV SOLN
Freq: Once | INTRAVENOUS | Status: AC
  Administered 2019-06-10: 11:00:00 via INTRAVENOUS
  Filled 2019-06-10: qty 8

## 2019-06-10 MED ORDER — SODIUM CHLORIDE 0.9 % IV SOLN: 8.0000 mg | Freq: Once | INTRAVENOUS | Status: DC

## 2019-06-10 MED ORDER — SODIUM CHLORIDE 0.9% FLUSH
10.0000 mL | INTRAVENOUS | Status: DC | PRN
  Administered 2019-06-10: 13:00:00 10 mL
  Filled 2019-06-10: qty 10

## 2019-06-10 NOTE — Patient Instructions (Signed)

## 2019-06-10 NOTE — Patient Instructions (Signed)
Dehydration, Adult  Dehydration is when there is not enough fluid or water in your body. This happens when you lose more fluids than you take in. Dehydration can range from mild to very bad. It should be treated right away to keep it from getting very bad. Symptoms of mild dehydration may include:  Thirst.  Dry lips.  Slightly dry mouth.  Dry, warm skin.  Dizziness. Symptoms of moderate dehydration may include:  Very dry mouth.  Muscle cramps.  Dark pee (urine). Pee may be the color of tea.  Your body making less pee.  Your eyes making fewer tears.  Heartbeat that is uneven or faster than normal (palpitations).  Headache.  Light-headedness, especially when you stand up from sitting.  Fainting (syncope). Symptoms of very bad dehydration may include:  Changes in skin, such as: ? Cold and clammy skin. ? Blotchy (mottled) or pale skin. ? Skin that does not quickly return to normal after being lightly pinched and let go (poor skin turgor).  Changes in body fluids, such as: ? Feeling very thirsty. ? Your eyes making fewer tears. ? Not sweating when body temperature is high, such as in hot weather. ? Your body making very little pee.  Changes in vital signs, such as: ? Weak pulse. ? Pulse that is more than 100 beats a minute when you are sitting still. ? Fast breathing. ? Low blood pressure.  Other changes, such as: ? Sunken eyes. ? Cold hands and feet. ? Confusion. ? Lack of energy (lethargy). ? Trouble waking up from sleep. ? Short-term weight loss. ? Unconsciousness. Follow these instructions at home:   If told by your doctor, drink an ORS: ? Make an ORS by using instructions on the package. ? Start by drinking small amounts, about  cup (120 mL) every 5-10 minutes. ? Slowly drink more until you have had the amount that your doctor said to have.  Drink enough clear fluid to keep your pee clear or pale yellow. If you were told to drink an ORS, finish the  ORS first, then start slowly drinking clear fluids. Drink fluids such as: ? Water. Do not drink only water by itself. Doing that can make the salt (sodium) level in your body get too low (hyponatremia). ? Ice chips. ? Fruit juice that you have added water to (diluted). ? Low-calorie sports drinks.  Avoid: ? Alcohol. ? Drinks that have a lot of sugar. These include high-calorie sports drinks, fruit juice that does not have water added, and soda. ? Caffeine. ? Foods that are greasy or have a lot of fat or sugar.  Take over-the-counter and prescription medicines only as told by your doctor.  Do not take salt tablets. Doing that can make the salt level in your body get too high (hypernatremia).  Eat foods that have minerals (electrolytes). Examples include bananas, oranges, potatoes, tomatoes, and spinach.  Keep all follow-up visits as told by your doctor. This is important. Contact a doctor if:  You have belly (abdominal) pain that: ? Gets worse. ? Stays in one area (localizes).  You have a rash.  You have a stiff neck.  You get angry or annoyed more easily than normal (irritability).  You are more sleepy than normal.  You have a harder time waking up than normal.  You feel: ? Weak. ? Dizzy. ? Very thirsty.  You have peed (urinated) only a small amount of very dark pee during 6-8 hours. Get help right away if:  You have   symptoms of very bad dehydration.  You cannot drink fluids without throwing up (vomiting).  Your symptoms get worse with treatment.  You have a fever.  You have a very bad headache.  You are throwing up or having watery poop (diarrhea) and it: ? Gets worse. ? Does not go away.  You have blood or something green (bile) in your throw-up.  You have blood in your poop (stool). This may cause poop to look black and tarry.  You have not peed in 6-8 hours.  You pass out (faint).  Your heart rate when you are sitting still is more than 100 beats a  minute.  You have trouble breathing. This information is not intended to replace advice given to you by your health care provider. Make sure you discuss any questions you have with your health care provider. Document Released: 07/13/2009 Document Revised: 08/29/2017 Document Reviewed: 11/10/2015 Elsevier Patient Education  2020 Elsevier Inc.  

## 2019-06-10 NOTE — Progress Notes (Signed)
RN flushed port with no blood return noted. CathFlo administered at 0949. After 30 minutes, no blood return. Waited another 30 minutes and checked blood return with success at 1049. Withdrew and wasted 17ml. Patient c/o no pain, burning, or discomfort.

## 2019-06-11 ENCOUNTER — Telehealth: Payer: Self-pay | Admitting: Hematology

## 2019-06-11 ENCOUNTER — Other Ambulatory Visit (HOSPITAL_COMMUNITY): Payer: Medicare Other

## 2019-06-11 NOTE — Telephone Encounter (Signed)
Scheduled appt per 9/10 los.  Spoke with patient and he did not want to schedule the appt for fluids on 9/14 nor 9/15. Patient had appts else where.  Okay per md to not schedule the appt, just to inform the patient to drink plenty of fluids to avoid dehydration.  Patient stated he will drink plenty of fluids.  Patient aware of the scheduled appts I scheduled.  Sent a staff message to get treatment added for 9/17.

## 2019-06-14 ENCOUNTER — Encounter: Payer: Self-pay | Admitting: Hematology

## 2019-06-14 ENCOUNTER — Ambulatory Visit: Payer: Medicare Other

## 2019-06-15 ENCOUNTER — Other Ambulatory Visit: Payer: Medicare Other

## 2019-06-15 DIAGNOSIS — C240 Malignant neoplasm of extrahepatic bile duct: Secondary | ICD-10-CM | POA: Diagnosis not present

## 2019-06-15 DIAGNOSIS — C221 Intrahepatic bile duct carcinoma: Secondary | ICD-10-CM | POA: Diagnosis not present

## 2019-06-15 NOTE — Progress Notes (Addendum)
Christopher Welch   Telephone:([phone] [phone]) [phone]   Clinic Follow up Note   Patient Care Team: [name], DO as PCP - General (Family Medicine) 06/17/2019  CHIEF COMPLAINT: f/u extrahepatic cholangiocarcinoma   SUMMARY OF ONCOLOGIC HISTORY: Oncology History  Cholangiocarcinoma ([place])  01/14/2019 Imaging   US Abdomen 01/14/19  IMPRESSION: There is significant intrahepatic and extrahepatic biliary dilatation present, concerning for distal common bile duct obstruction. Further evaluation with CT scan or MRCP is recommended. Correlation with liver function tests is recommended as well. These results will be called to the ordering clinician or representative by the Radiologist Assistant, and communication documented in the PACS or zVision Dashboard.   Probable large amount of sludge seen within gallbladder lumen with mild gallbladder wall thickening. However, no cholelithiasis, pericholecystic fluid or sonographic Murphy's sign is noted.   4.2 cm septated cyst seen in upper pole of right kidney consistent with Bosniak type 2 lesion. Follow-up ultrasound in 1 year is recommended to ensure stability.   01/20/2019 Imaging   MRI abdomen 01/20/19  IMPRESSION: 1. Findings are highly concerning for central tumor in the biliary tract at the confluence of the common hepatic duct, cystic duct and common bile ducts. Further clinical evaluation for potential cholangiocarcinoma is strongly recommended. 2. Mild ductal dilatation of the main pancreatic duct throughout the distal body and tail of the pancreas where there is also some associated side branch ectasia. This may suggest a pancreatic ductal stricture. No obstructing pancreatic neoplasm identified. 3. Aortic atherosclerosis.   01/21/2019 Initial Biopsy   Diagnosis 01/21/19  BILE DUCT BRUSHING (SPECIMEN 1 OF 1 COLLECTED 01/21/2019) ADENOCARCINOMA.   01/21/2019 Procedure   ERCP By Dr hung 01/21/19 IMRPESSION -  The major papilla appeared normal. - A single localized biliary stricture was found in the middle third of the main bile duct. - The entire main bile duct and upper third of the main bile duct were dilated, secondary to a stricture. - A biliary sphincterotomy was performed. - Cells for cytology obtained in the middle third of the main bile duct. - One plastic stent was placed into the common bile duct.  EUS by Dr hung 01/21/19  IMPRESSION - There was dilation in the middle third of the main bile duct and in the upper third of the main bile duct which measured up to 15 mm. - There was a suggestion of a stricture in the middle third of the main bile duct. - No specimens collected.   02/03/2019 Imaging   CT CAP at [facility] 02/03/19  Impression:  1. There is mild intrahepatic and extrahepatic biliary ductal dilation and diffuse common bile duct wall thickening with stent in place. There is abnormal soft tissue measuring approximately 1.6 cm between the common hepatic artery, portal vein and common bile duct, worrisome for the known cholangiocarcinoma.  2. Periportal lymphadenopathy which may represent nodal metastasis.  4. Marked pancreatic atrophy and duct dilation involving the distal body and tail the pancreas where there is a coarse calcification. These findings are favored to represent stenosis from intraductal stones though an underlying stricture cannot be completely excluded.  5. Atypical symmetric prominent fat stranding of the bilateral lower abdominal wall. Correlate with surgical history or history of history of trauma.   03/08/2019 Initial Diagnosis   Cholangiocarcinoma ([place])   03/12/2019 -  Chemotherapy   gemcitabine and cisplatin on days 1 and 8 every 21 days starting 03/12/19. Abraxane added with cycle 2. Added GCSF [name]) to day 9 starting cycle  2.    03/19/2019 Cancer Staging   Staging form: Perihilar Bile Ducts, AJCC 8th Edition - Clinical: Stage Unknown (cTX, cN0,  cM0) - Signed by [name], MD on 03/19/2019   05/24/2019 Imaging   CT CAP W Contrast at [facility]  1.  Interval placement of covered metallic biliary stent. No progressive dilation of the biliary tree and resulting resolution of the prior main pancreatic duct dilation. 2.  Evidence of some subtle vascular contour deformity involving the hepatic artery, suspected to be from tumoral contact and/or posttreatment changes. 3.  New small right pleural effusion with overlying mild airspace disease. Secondary findings which may indicate a component of aspiration. 4.  No convincing evidence of new disseminated metastatic disease. 5.  Additional findings as discussed above.     CURRENT THERAPY: gemcitabine and cisplatin on days 1 and 8 every 21 daysstarting 03/12/19. Abraxane added with cycle 2.AddedGCSF ([name])to day 9 startingcycle 2.  INTERVAL HISTORY: Christopher Welch returns for f/u and treatment as scheduled. He completed cycle 5 day 1 cisplatin/gemcitabine on 06/03/19. He developed profuse leaking around his biliary drain on 06/08/19 and tube was exchanged per IR. He had significant abdominal pain after the procedure and day 8 chemo was held on 9/10.  He received supportive care.   He does not feel well today. He feels winded on exertion and occasionally at rest. Denies cough or chest pain. He has some RUQ discomfort but pain is mostly in right chest, 7/10, that worsens with movement. Pain is best when still. His pain has not improved much over the last week. Taking oxycodone BID. Since last visit he reports vomiting TID after meals. Nausea meds do not help much, but he is not nauseous beforehand. BM q4 days, normal for him is q5-6 days. He is passing gas. He is not eating much. He drinks some water and mixes carnation with milk. Denies fever or chills. He is not up much during the day, spending over half the day resting/in bed. He sleeps intermittently at night. Mobility is limited by his pain.    MEDICAL  HISTORY:  Past Medical History:  Diagnosis Date  . Diabetes mellitus type I, controlled ([place])   . Dyslipidemia   . ED (erectile dysfunction)   . Essential hypertension   . Gilbert's disease   . Hepatitis B carrier [facility])     SURGICAL HISTORY: Past Surgical History:  Procedure Laterality Date  . APPENDECTOMY  1964  . BILIARY BRUSHING  01/21/2019   Procedure: BILIARY BRUSHING;  Surgeon: Carol Ada, MD;  Location: [facility];  Service: Endoscopy;;  . BILIARY STENT PLACEMENT N/A 01/21/2019   Procedure: BILIARY STENT PLACEMENT;  Surgeon: Carol Ada, MD;  Location: [facility];  Service: Endoscopy;  Laterality: N/A;  . ENDOSCOPIC RETROGRADE CHOLANGIOPANCREATOGRAPHY (ERCP) WITH PROPOFOL N/A 01/21/2019   Procedure: ENDOSCOPIC RETROGRADE CHOLANGIOPANCREATOGRAPHY (ERCP) WITH PROPOFOL;  Surgeon: Carol Ada, MD;  Location: [facility];  Service: Endoscopy;  Laterality: N/A;  . ESOPHAGOGASTRODUODENOSCOPY (EGD) WITH PROPOFOL N/A 01/21/2019   Procedure: ESOPHAGOGASTRODUODENOSCOPY (EGD) WITH PROPOFOL;  Surgeon: Carol Ada, MD;  Location: [facility];  Service: Endoscopy;  Laterality: N/A;  . FEMORAL HERNIA REPAIR    . IR CHOLANGIOGRAM EXISTING TUBE  05/13/2019  . IR CHOLANGIOGRAM EXISTING TUBE  06/08/2019  . IR EXCHANGE BILIARY DRAIN  05/18/2019  . IR EXCHANGE BILIARY DRAIN  06/08/2019  . IR IMAGING GUIDED PORT INSERTION  03/08/2019  . IR PERC CHOLECYSTOSTOMY  05/04/2019  . LEFT HEART CATH AND CORONARY ANGIOGRAPHY N/A 05/19/2017  Procedure: LEFT HEART CATH AND CORONARY ANGIOGRAPHY;  Surgeon: Leonie Man, MD;  Location: [place] CV LAB;  Service: Cardiovascular;  Laterality: N/A;  . SPHINCTEROTOMY  01/21/2019   Procedure: SPHINCTEROTOMY;  Surgeon: Carol Ada, MD;  Location: [facility];  Service: Endoscopy;;  . UPPER ESOPHAGEAL ENDOSCOPIC ULTRASOUND (EUS) N/A 01/21/2019   Procedure: UPPER ESOPHAGEAL ENDOSCOPIC ULTRASOUND (EUS);  Surgeon: Carol Ada, MD;  Location: [facility];   Service: Endoscopy;  Laterality: N/A;    I have reviewed the social history and family history with the patient and they are unchanged from previous note.  ALLERGIES:  is allergic to erythromycin.  MEDICATIONS:  No current facility-administered medications for this visit.    No current outpatient medications on file.   Facility-Administered Medications Ordered in Other Visits  Medication Dose Route Frequency Provider Last Rate Last Dose  . 0.9 %  sodium chloride infusion   Intravenous Once Marylyn Ishihara, Tyrone A, DO      . acetaminophen (TYLENOL) tablet 1,000 mg  1,000 mg Oral Q6H PRN Marylyn Ishihara, Tyrone A, DO      . amLODipine (NORVASC) tablet 5 mg  5 mg Oral Daily Kyle, Tyrone A, DO      . atenolol (TENORMIN) tablet 25 mg  25 mg Oral Daily Kyle, Tyrone A, DO      . atorvastatin (LIPITOR) tablet 10 mg  10 mg Oral Daily Kyle, Tyrone A, DO      . enoxaparin (LOVENOX) injection 40 mg  40 mg Subcutaneous Q24H Kyle, Tyrone A, DO      . insulin NPH Human (NOVOLIN N) injection 20 Units  20 Units Subcutaneous QHS Kyle, Tyrone A, DO      . multivitamin with minerals tablet 1 tablet  1 tablet Oral Daily Kyle, Tyrone A, DO      . oxycodone (OXY-IR) immediate release capsule 5 mg  5 mg Oral Q6H PRN Marylyn Ishihara, Tyrone A, DO      . prochlorperazine (COMPAZINE) tablet 10 mg  10 mg Oral Q6H PRN Marylyn Ishihara, Tyrone A, DO      . sodium zirconium cyclosilicate (LOKELMA) packet 5 g  5 g Oral Daily Kyle, Tyrone A, DO        PHYSICAL EXAMINATION: ECOG PERFORMANCE STATUS: 3 - Symptomatic, >50% confined to bed  Vitals:   06/17/19 0825 06/17/19 1157  BP: 121/69 110/67  Pulse: 92 73  Resp: 18 18  Temp: 98.9 F (37.2 C) 98.6 F (37 C)  SpO2: 98% 100%   Filed Weights   06/17/19 0825  Weight: 145 lb 4 oz (65.9 kg)    GENERAL:alert, no distress but uncomfortable SKIN: no obvious rash  EYES:  sclera clear LYMPH:  no palpable cervical or supraclavicular lymphadenopathy LUNGS: clear, diminished in right base; normal breathing  effort HEART: regular rate, no lower extremity edema ABDOMEN:abdomen soft, non-tender and normal bowel sounds Musculoskeletal: no cyanosis of digits  NEURO: alert & oriented x 3 with fluent speech, slight weakness. Normal gait PAC without erythema   LABORATORY DATA:  I have reviewed the data as listed CBC Latest Ref Rng & Units 06/17/2019 06/10/2019 06/03/2019  WBC 4.0 - 10.5 K/uL 11.0(H) 8.5 9.6  Hemoglobin 13.0 - 17.0 g/dL 9.9(L) 10.2(L) 9.8(L)  Hematocrit 39.0 - 52.0 % 30.5(L) 30.2(L) 30.4(L)  Platelets 150 - 400 K/uL 383 317 331     CMP Latest Ref Rng & Units 06/17/2019 06/10/2019 06/03/2019  Glucose 70 - 99 mg/dL 358(H) 290(H) 187(H)  BUN 8 - 23 mg/dL 28(H) 28(H) 20  Creatinine 0.61 - 1.24 mg/dL 1.52(H) 1.48(H) 1.20  Sodium 135 - 145 mmol/L 130(L) 132(L) 134(L)  Potassium 3.5 - 5.1 mmol/L 5.8(H) 4.4 4.3  Chloride 98 - 111 mmol/L 95(L) 97(L) 99  CO2 22 - 32 mmol/L 25 25 27   Calcium 8.9 - 10.3 mg/dL 9.1 9.1 9.2  Total Protein 6.5 - 8.1 g/dL 6.9 6.8 6.8  Total Bilirubin 0.3 - 1.2 mg/dL 0.6 0.4 0.4  Alkaline Phos 38 - 126 U/L 59 59 65  AST 15 - 41 U/L 33 12(L) 13(L)  ALT 0 - 44 U/L 32 12 12      RADIOGRAPHIC STUDIES: I have personally reviewed the radiological images as listed and agreed with the findings in the report. No results found.   ASSESSMENT & PLAN: Christopher Welch a 73 y.o.malewith   1. Extrahepatic cholangiocarcinoma, cTxN0M0 -He wasdiagnosedin 12/2018; presented with malignant stricture and CBD obstruction required stenting. Brushing revealed adenocarcinoma. He underwent attempted whipple per Dr[facility] Zenia Resides [facility] which was aborted due to vascular invasion -He was referred to our clinic to initiate chemotherapy in the neoadjuvant setting. -He proceeded with neoadjuvantgemcitabine and cisplatin on days 1 and 8 every 21 daysstarting 03/12/19. Abraxane added with cycle 2.AddedGCSF ([name])to day 9 startingcycle 2. -Hetolerated first few cycles very well  without significant side effects; tumor marker CA 19.9dropped significantly as he started chemo, indicating good response to chemo treatment -his performance status decreased lately, abraxane was held with cycle 4   -He underwent restaging CT at request [facility] on 05/24/2019 which was overall stable, no new or progressive metastatic disease.  Dr. Carlis Abbott recommended to continue chemotherapy.  -S/p cycle 5 day 1 cisplatin/gemcitabine on 06/03/19, day 8 chemo held on 9/10 due to procedural pain secondary to biliary drain removal. He has received supportive care.   2. Acute cholecystitis -Hospitalizedon 05/03/19; s/p percutaneous gallbladder drainage tube placed by IR8/5/20. -completed antibiotics -tube exchanged 8/18 due to leakage which has resolved, his abdominal pain improved until it began leaking again on 9/8. IR could not exchange the tube as the gallbladder was completely decompressed and it was removed -he has persistent right side pain since removal of the tube, more in right chest than RUQ, unclear if this represents recurrent cholecystitis vs other etiology. He will likely undergo abd [place] or CT for further work up  3. Weight lossand low appetite -secondary to malignancyand chemo -he presented with 20 lbs weight loss  -We reviewed nutrition support withGlucerna -Continue to f/u with dietician -His appetitehas dropped significantly with recent cholecystitis. He was able to maintain weight. -continues carnation and boost supplements -over the last week he has vomiting after meals, TID; not able to keep much down  4. DM -managed by PCP Dr. Theda Sers -he has had DM for 20 years, he is compliant with regimen and knows how to adjust BG -on insulin, he knows how to adjust dose based on his BG level -He presents today with significant electrolyte abnormalities related to hyperglycemia, he was given 10 units regular insulin in clinic today, BG improved somewhat   5. CAD, HL, HTN  -followed by cardiologist Dr. Ellyn Hack -on amlodipine, atenolol, lisinopril, and statin -BP stable today  6. Social support -he is single, no children, he lives alone -He has a good friend who checks on him -previously referred to [facility]  7.Insomnia -He is able to fall asleep but has trouble staying asleep -He has tried OTC Z-quil which only moderately help.  -on Ambien PRN  Disposition:  Mr.  Welch does not appear well. He continues to have significant upper right side/chest pain, which could be referred pain from recent biliary drain removal. He is vomiting after meals, not able to maintain adequate nutrition. CBC with mildly elevated WBC 11.0. He has abnormal electrolytes today including hyperglycemia, hyperkalemia, hyponatremia, and worsening renal function. His physical exam is overall benign, not much RUQ tenderness, which is not strongly suggestive of recurrent cholecystitis.   The patient was seen with Dr. Burr Medico who is recommending hospital admission for expedited work up for his symptoms and correction of electrolyte abnormalities. The case was accepted by the hospitalist team for direct admission. COVID19 test was negative today in clinic. Will follow him while inpatient.  While waiting for bed placement in clinic, he has received NS infusion, IV zofran 8 mg, and 10 units regular insulin. BG improved to 255. He is resting in the exam room and being monitored closely. VSS. He was able to eat some soup. He was transported to hospital room by my LPN around [id number] in stable condition.   All questions were answered. The patient knows to call the clinic with any problems, questions or concerns. No barriers to learning was detected.     Alla Feeling, NP 06/17/19   Addendum  I have seen the patient, examined him. I agree with the assessment and and plan and have edited the notes.   Christopher Welch is not doing very, with very low appetite, persistent nausea, abdominal pain and  constipation. Lab reviewed, he has AKI, hyperglycemia and hyperkalemia. No fever or signs of infection. Exam was negative for Murphy sign. I recommend hospital admission for IVF and symptom management. I will recommend getting a CT abdomen and pelvis with contrast for further evaluation also. I will f/u him in hospital tomorrow.   [name]  06/17/2019

## 2019-06-16 ENCOUNTER — Other Ambulatory Visit: Payer: Medicare Other

## 2019-06-17 ENCOUNTER — Encounter: Payer: Self-pay | Admitting: Nurse Practitioner

## 2019-06-17 ENCOUNTER — Inpatient Hospital Stay (HOSPITAL_BASED_OUTPATIENT_CLINIC_OR_DEPARTMENT_OTHER): Payer: Medicare Other | Admitting: Nurse Practitioner

## 2019-06-17 ENCOUNTER — Inpatient Hospital Stay (HOSPITAL_COMMUNITY)
Admission: AD | Admit: 2019-06-17 | Discharge: 2019-06-20 | DRG: 445 | Disposition: A | Discharge status: Home / Self Care | Payer: Medicare Other | Source: Ambulatory Visit | Attending: Internal Medicine | Admitting: Internal Medicine

## 2019-06-17 ENCOUNTER — Inpatient Hospital Stay (HOSPITAL_BASED_OUTPATIENT_CLINIC_OR_DEPARTMENT_OTHER): Payer: Medicare Other | Admitting: Medical

## 2019-06-17 ENCOUNTER — Other Ambulatory Visit: Payer: Self-pay | Admitting: Medical

## 2019-06-17 ENCOUNTER — Inpatient Hospital Stay: Payer: Medicare Other

## 2019-06-17 ENCOUNTER — Other Ambulatory Visit: Payer: Self-pay

## 2019-06-17 ENCOUNTER — Encounter (HOSPITAL_COMMUNITY): Payer: Self-pay

## 2019-06-17 VITALS — BP 110/67 | HR 73 | Temp 98.6°F | Resp 18 | Wt 145.2 lb

## 2019-06-17 DIAGNOSIS — E119 Type 2 diabetes mellitus without complications: Secondary | ICD-10-CM | POA: Diagnosis not present

## 2019-06-17 DIAGNOSIS — I2510 Atherosclerotic heart disease of native coronary artery without angina pectoris: Secondary | ICD-10-CM | POA: Diagnosis present

## 2019-06-17 DIAGNOSIS — I129 Hypertensive chronic kidney disease with stage 1 through stage 4 chronic kidney disease, or unspecified chronic kidney disease: Secondary | ICD-10-CM | POA: Diagnosis present

## 2019-06-17 DIAGNOSIS — Z833 Family history of diabetes mellitus: Secondary | ICD-10-CM

## 2019-06-17 DIAGNOSIS — K828 Other specified diseases of gallbladder: Secondary | ICD-10-CM | POA: Diagnosis not present

## 2019-06-17 DIAGNOSIS — R1013 Epigastric pain: Secondary | ICD-10-CM

## 2019-06-17 DIAGNOSIS — E785 Hyperlipidemia, unspecified: Secondary | ICD-10-CM | POA: Diagnosis present

## 2019-06-17 DIAGNOSIS — R112 Nausea with vomiting, unspecified: Secondary | ICD-10-CM | POA: Diagnosis not present

## 2019-06-17 DIAGNOSIS — C221 Intrahepatic bile duct carcinoma: Secondary | ICD-10-CM | POA: Diagnosis present

## 2019-06-17 DIAGNOSIS — E1022 Type 1 diabetes mellitus with diabetic chronic kidney disease: Secondary | ICD-10-CM | POA: Diagnosis present

## 2019-06-17 DIAGNOSIS — Z95828 Presence of other vascular implants and grafts: Secondary | ICD-10-CM

## 2019-06-17 DIAGNOSIS — Z794 Long term (current) use of insulin: Secondary | ICD-10-CM

## 2019-06-17 DIAGNOSIS — Z881 Allergy status to other antibiotic agents status: Secondary | ICD-10-CM

## 2019-06-17 DIAGNOSIS — R739 Hyperglycemia, unspecified: Secondary | ICD-10-CM

## 2019-06-17 DIAGNOSIS — E1065 Type 1 diabetes mellitus with hyperglycemia: Secondary | ICD-10-CM | POA: Diagnosis present

## 2019-06-17 DIAGNOSIS — E875 Hyperkalemia: Secondary | ICD-10-CM | POA: Diagnosis present

## 2019-06-17 DIAGNOSIS — N179 Acute kidney failure, unspecified: Secondary | ICD-10-CM | POA: Diagnosis present

## 2019-06-17 DIAGNOSIS — K810 Acute cholecystitis: Principal | ICD-10-CM | POA: Diagnosis present

## 2019-06-17 DIAGNOSIS — Z20828 Contact with and (suspected) exposure to other viral communicable diseases: Secondary | ICD-10-CM | POA: Diagnosis present

## 2019-06-17 DIAGNOSIS — D649 Anemia, unspecified: Secondary | ICD-10-CM | POA: Diagnosis not present

## 2019-06-17 DIAGNOSIS — E871 Hypo-osmolality and hyponatremia: Secondary | ICD-10-CM | POA: Diagnosis present

## 2019-06-17 DIAGNOSIS — D630 Anemia in neoplastic disease: Secondary | ICD-10-CM | POA: Diagnosis present

## 2019-06-17 DIAGNOSIS — R1110 Vomiting, unspecified: Secondary | ICD-10-CM | POA: Diagnosis not present

## 2019-06-17 DIAGNOSIS — I10 Essential (primary) hypertension: Secondary | ICD-10-CM | POA: Diagnosis present

## 2019-06-17 DIAGNOSIS — Z9689 Presence of other specified functional implants: Secondary | ICD-10-CM | POA: Diagnosis not present

## 2019-06-17 DIAGNOSIS — N182 Chronic kidney disease, stage 2 (mild): Secondary | ICD-10-CM | POA: Diagnosis present

## 2019-06-17 DIAGNOSIS — R109 Unspecified abdominal pain: Secondary | ICD-10-CM | POA: Diagnosis not present

## 2019-06-17 DIAGNOSIS — E1140 Type 2 diabetes mellitus with diabetic neuropathy, unspecified: Secondary | ICD-10-CM | POA: Diagnosis not present

## 2019-06-17 DIAGNOSIS — N281 Cyst of kidney, acquired: Secondary | ICD-10-CM | POA: Diagnosis not present

## 2019-06-17 LAB — COMPREHENSIVE METABOLIC PANEL
ALT: 31 U/L (ref 0–44)
AST: 31 U/L (ref 15–41)
Albumin: 2.5 g/dL — ABNORMAL LOW (ref 3.5–5.0)
Alkaline Phosphatase: 45 U/L (ref 38–126)
Anion gap: 9 (ref 5–15)
BUN: 26 mg/dL — ABNORMAL HIGH (ref 8–23)
CO2: 24 mmol/L (ref 22–32)
Calcium: 8.2 mg/dL — ABNORMAL LOW (ref 8.9–10.3)
Chloride: 97 mmol/L — ABNORMAL LOW (ref 98–111)
Creatinine, Ser: 1.2 mg/dL (ref 0.61–1.24)
GFR calc Af Amer: >60 mL/min (ref >60)
GFR calc non Af Amer: >60 mL/min (ref >60)
Glucose, Bld: 185 mg/dL — ABNORMAL HIGH (ref 70–99)
Potassium: 4.6 mmol/L (ref 3.5–5.1)
Sodium: 130 mmol/L — ABNORMAL LOW (ref 135–145)
Total Bilirubin: 0.6 mg/dL (ref 0.3–1.2)
Total Protein: 6.3 g/dL — ABNORMAL LOW (ref 6.5–8.1)

## 2019-06-17 LAB — CBC WITH DIFFERENTIAL/PLATELET
Abs Immature Granulocytes: 0.05 10*3/uL (ref 0.00–0.07)
Basophils Absolute: 0 10*3/uL (ref 0.0–0.1)
Basophils Relative: 0 %
Eosinophils Absolute: 0.1 10*3/uL (ref 0.0–0.5)
Eosinophils Relative: 1 %
HCT: 28.2 % — ABNORMAL LOW (ref 39.0–52.0)
Hemoglobin: 8.8 g/dL — ABNORMAL LOW (ref 13.0–17.0)
Immature Granulocytes: 1 %
Lymphocytes Relative: 23 %
Lymphs Abs: 2.2 10*3/uL (ref 0.7–4.0)
MCH: 29.1 pg (ref 26.0–34.0)
MCHC: 31.2 g/dL (ref 30.0–36.0)
MCV: 93.4 fL (ref 80.0–100.0)
Monocytes Absolute: 2.1 10*3/uL — ABNORMAL HIGH (ref 0.1–1.0)
Monocytes Relative: 22 %
Neutro Abs: 5 10*3/uL (ref 1.7–7.7)
Neutrophils Relative %: 53 %
Platelets: 386 10*3/uL (ref 150–400)
RBC: 3.02 MIL/uL — ABNORMAL LOW (ref 4.22–5.81)
RDW: 16.6 % — ABNORMAL HIGH (ref 11.5–15.5)
WBC: 9.4 10*3/uL (ref 4.0–10.5)
nRBC: 0 % (ref 0.0–0.2)

## 2019-06-17 LAB — SARS CORONAVIRUS 2 BY RT PCR (HOSPITAL ORDER, PERFORMED IN ~~LOC~~ HOSPITAL LAB): SARS Coronavirus 2: NEGATIVE

## 2019-06-17 LAB — CBC WITH DIFFERENTIAL (CANCER CENTER ONLY)
Abs Immature Granulocytes: 0.08 10*3/uL — ABNORMAL HIGH (ref 0.00–0.07)
Basophils Absolute: 0 10*3/uL (ref 0.0–0.1)
Basophils Relative: 0 %
Eosinophils Absolute: 0.1 10*3/uL (ref 0.0–0.5)
Eosinophils Relative: 1 %
HCT: 30.5 % — ABNORMAL LOW (ref 39.0–52.0)
Hemoglobin: 9.9 g/dL — ABNORMAL LOW (ref 13.0–17.0)
Immature Granulocytes: 1 %
Lymphocytes Relative: 12 %
Lymphs Abs: 1.3 10*3/uL (ref 0.7–4.0)
MCH: 29.8 pg (ref 26.0–34.0)
MCHC: 32.5 g/dL (ref 30.0–36.0)
MCV: 91.9 fL (ref 80.0–100.0)
Monocytes Absolute: 1.9 10*3/uL — ABNORMAL HIGH (ref 0.1–1.0)
Monocytes Relative: 17 %
Neutro Abs: 7.7 10*3/uL (ref 1.7–7.7)
Neutrophils Relative %: 69 %
Platelet Count: 383 10*3/uL (ref 150–400)
RBC: 3.32 MIL/uL — ABNORMAL LOW (ref 4.22–5.81)
RDW: 16.7 % — ABNORMAL HIGH (ref 11.5–15.5)
WBC Count: 11 10*3/uL — ABNORMAL HIGH (ref 4.0–10.5)
nRBC: 0 % (ref 0.0–0.2)

## 2019-06-17 LAB — CMP (CANCER CENTER ONLY)
ALT: 32 U/L (ref 0–44)
AST: 33 U/L (ref 15–41)
Albumin: 2.7 g/dL — ABNORMAL LOW (ref 3.5–5.0)
Alkaline Phosphatase: 59 U/L (ref 38–126)
Anion gap: 10 (ref 5–15)
BUN: 28 mg/dL — ABNORMAL HIGH (ref 8–23)
CO2: 25 mmol/L (ref 22–32)
Calcium: 9.1 mg/dL (ref 8.9–10.3)
Chloride: 95 mmol/L — ABNORMAL LOW (ref 98–111)
Creatinine: 1.52 mg/dL — ABNORMAL HIGH (ref 0.61–1.24)
GFR, Est AFR Am: 53 mL/min — ABNORMAL LOW (ref >60)
GFR, Estimated: 45 mL/min — ABNORMAL LOW (ref >60)
Glucose, Bld: 358 mg/dL — ABNORMAL HIGH (ref 70–99)
Potassium: 5.8 mmol/L — ABNORMAL HIGH (ref 3.5–5.1)
Sodium: 130 mmol/L — ABNORMAL LOW (ref 135–145)
Total Bilirubin: 0.6 mg/dL (ref 0.3–1.2)
Total Protein: 6.9 g/dL (ref 6.5–8.1)

## 2019-06-17 LAB — MAGNESIUM: Magnesium: 2 mg/dL (ref 1.7–2.4)

## 2019-06-17 LAB — GLUCOSE, CAPILLARY
Glucose-Capillary: 260 mg/dL — ABNORMAL HIGH (ref 70–99)
Glucose-Capillary: 189 mg/dL — ABNORMAL HIGH (ref 70–99)

## 2019-06-17 MED ORDER — ACETAMINOPHEN 500 MG PO TABS
1000.0000 mg | ORAL_TABLET | Freq: Four times a day (QID) | ORAL | Status: DC | PRN
  Administered 2019-06-17: 20:00:00 1000 mg via ORAL
  Filled 2019-06-17: qty 2

## 2019-06-17 MED ORDER — ONDANSETRON HCL 4 MG/2ML IJ SOLN
INTRAMUSCULAR | Status: AC
  Filled 2019-06-17: qty 4

## 2019-06-17 MED ORDER — ENOXAPARIN SODIUM 40 MG/0.4ML ~~LOC~~ SOLN
40.0000 mg | SUBCUTANEOUS | Status: DC
  Administered 2019-06-17 – 2019-06-18 (×2): 40 mg via SUBCUTANEOUS
  Filled 2019-06-17 (×2): qty 0.4

## 2019-06-17 MED ORDER — OXYCODONE HCL 5 MG PO TABS
5.0000 mg | ORAL_TABLET | Freq: Four times a day (QID) | ORAL | Status: DC | PRN
  Administered 2019-06-17 – 2019-06-19 (×7): 5 mg via ORAL
  Filled 2019-06-17 (×7): qty 1

## 2019-06-17 MED ORDER — OXYCODONE HCL 5 MG PO CAPS: 5.0000 mg | ORAL_CAPSULE | Freq: Four times a day (QID) | ORAL | Status: DC | PRN

## 2019-06-17 MED ORDER — ONDANSETRON HCL 4 MG/2ML IJ SOLN
8.0000 mg | Freq: Once | INTRAMUSCULAR | Status: AC
  Administered 2019-06-17: 8 mg via INTRAVENOUS

## 2019-06-17 MED ORDER — INSULIN ASPART 100 UNIT/ML ~~LOC~~ SOLN
0.0000 [IU] | Freq: Three times a day (TID) | SUBCUTANEOUS | Status: DC
  Administered 2019-06-17: 18:00:00 5 [IU] via SUBCUTANEOUS
  Administered 2019-06-18: 3 [IU] via SUBCUTANEOUS
  Administered 2019-06-18: 08:00:00 1 [IU] via SUBCUTANEOUS
  Administered 2019-06-18 – 2019-06-19 (×2): 3 [IU] via SUBCUTANEOUS
  Administered 2019-06-19: 13:00:00 7 [IU] via SUBCUTANEOUS
  Administered 2019-06-19: 11:00:00 3 [IU] via SUBCUTANEOUS
  Administered 2019-06-20: 7 [IU] via SUBCUTANEOUS
  Administered 2019-06-20: 09:00:00 5 [IU] via SUBCUTANEOUS

## 2019-06-17 MED ORDER — SODIUM ZIRCONIUM CYCLOSILICATE 5 G PO PACK
5.0000 g | PACK | Freq: Every day | ORAL | Status: AC
  Administered 2019-06-17: 19:00:00 5 g via ORAL
  Filled 2019-06-17: qty 1

## 2019-06-17 MED ORDER — INSULIN NPH (HUMAN) (ISOPHANE) 100 UNIT/ML ~~LOC~~ SUSP
20.0000 [IU] | Freq: Every day | SUBCUTANEOUS | Status: DC
  Administered 2019-06-17: 23:00:00 20 [IU] via SUBCUTANEOUS
  Filled 2019-06-17: qty 10

## 2019-06-17 MED ORDER — SODIUM CHLORIDE 0.9 % IV SOLN
Freq: Once | INTRAVENOUS | Status: AC
  Administered 2019-06-17: 09:00:00 via INTRAVENOUS
  Filled 2019-06-17: qty 250

## 2019-06-17 MED ORDER — PROCHLORPERAZINE MALEATE 10 MG PO TABS
10.0000 mg | ORAL_TABLET | Freq: Four times a day (QID) | ORAL | Status: DC | PRN
  Administered 2019-06-17 – 2019-06-19 (×3): 10 mg via ORAL
  Filled 2019-06-17 (×3): qty 1

## 2019-06-17 MED ORDER — INSULIN REGULAR HUMAN 100 UNIT/ML IJ SOLN
10.0000 [IU] | Freq: Once | INTRAMUSCULAR | Status: AC
  Administered 2019-06-17: 10:00:00 10 [IU] via SUBCUTANEOUS
  Filled 2019-06-17: qty 10

## 2019-06-17 MED ORDER — ACETAMINOPHEN 500 MG PO TABS: 1000.0000 mg | ORAL_TABLET | Freq: Two times a day (BID) | ORAL | Status: DC | PRN

## 2019-06-17 MED ORDER — ADULT MULTIVITAMIN W/MINERALS CH
1.0000 | ORAL_TABLET | Freq: Every day | ORAL | Status: DC
  Administered 2019-06-17 – 2019-06-20 (×4): 1 via ORAL
  Filled 2019-06-17 (×4): qty 1

## 2019-06-17 MED ORDER — ATORVASTATIN CALCIUM 10 MG PO TABS
10.0000 mg | ORAL_TABLET | Freq: Every day | ORAL | Status: DC
  Administered 2019-06-18 – 2019-06-20 (×3): 10 mg via ORAL
  Filled 2019-06-17 (×3): qty 1

## 2019-06-17 MED ORDER — SODIUM CHLORIDE 0.9 % IV SOLN: 8.0000 mg | Freq: Once | INTRAVENOUS | Status: DC

## 2019-06-17 MED ORDER — SODIUM CHLORIDE 0.9% FLUSH
10.0000 mL | INTRAVENOUS | Status: DC | PRN
  Administered 2019-06-17: 10 mL
  Filled 2019-06-17: qty 10

## 2019-06-17 MED ORDER — SODIUM CHLORIDE 0.9 % IV SOLN
Freq: Once | INTRAVENOUS | Status: AC
  Administered 2019-06-17: 17:00:00 via INTRAVENOUS

## 2019-06-17 MED ORDER — ATENOLOL 25 MG PO TABS
25.0000 mg | ORAL_TABLET | Freq: Every day | ORAL | Status: DC
  Administered 2019-06-18 – 2019-06-20 (×3): 25 mg via ORAL
  Filled 2019-06-17 (×3): qty 1

## 2019-06-17 MED ORDER — AMLODIPINE BESYLATE 5 MG PO TABS
5.0000 mg | ORAL_TABLET | Freq: Every day | ORAL | Status: DC
  Administered 2019-06-18 – 2019-06-20 (×3): 5 mg via ORAL
  Filled 2019-06-17 (×3): qty 1

## 2019-06-17 NOTE — Addendum Note (Signed)
Addended by: Charlyn Minerva on: 06/17/2019 09:23 AM   Modules accepted: Orders

## 2019-06-17 NOTE — Progress Notes (Signed)
This patient was seen today for COVID-19 testing prior to admission today.  A COVID-19 test was collected and was submitted.  The results returned negative.  Sandi Mealy, MHS, PA-C Physician Assistant

## 2019-06-17 NOTE — H&P (Signed)
.  History and Physical    Christopher Welch HEN:[id number] DOB: Oct 19, 1945 DOA: 06/17/2019  PCP: [name], DO  Patient coming from: Oncology office   Chief Complaint: abdominal pain  HPI: Christopher Welch is a 73 y.o. male with medical history significant of DM2, cholangiocarcinoma. Presents from oncology office with complaints of abdominal pain. Has history of multiple cholecystostomies. Recently had one pulled (3 weeks ago). Pain building in same area in similar fashion to previous bouts of cholecystitis. No aggravating factors. Nothing he has tried has alleviated the pain. Saw his oncologist today and she found him to be hyponatremic, hyperkalemic, and hyperglycemic. She recommended that he come to the hospital as a direct admit for work up.   Review of Systems: He reports N, abdominal pain, fatigue, poor appetite. Denies chest pain, dyspnea, palpitations, HA. Remainder of 10 point review of systems is otherwise negative for all not mentioned in HPI.    Past Medical History:  Diagnosis Date  . Diabetes mellitus type I, controlled ([place])   . Dyslipidemia   . ED (erectile dysfunction)   . Essential hypertension   . Gilbert's disease   . Hepatitis B carrier [facility])     Past Surgical History:  Procedure Laterality Date  . APPENDECTOMY  1964  . BILIARY BRUSHING  01/21/2019   Procedure: BILIARY BRUSHING;  Surgeon: Carol Ada, MD;  Location: [facility];  Service: Endoscopy;;  . BILIARY STENT PLACEMENT N/A 01/21/2019   Procedure: BILIARY STENT PLACEMENT;  Surgeon: Carol Ada, MD;  Location: [facility];  Service: Endoscopy;  Laterality: N/A;  . ENDOSCOPIC RETROGRADE CHOLANGIOPANCREATOGRAPHY (ERCP) WITH PROPOFOL N/A 01/21/2019   Procedure: ENDOSCOPIC RETROGRADE CHOLANGIOPANCREATOGRAPHY (ERCP) WITH PROPOFOL;  Surgeon: Carol Ada, MD;  Location: [facility];  Service: Endoscopy;  Laterality: N/A;  . ESOPHAGOGASTRODUODENOSCOPY (EGD) WITH PROPOFOL N/A 01/21/2019   Procedure:  ESOPHAGOGASTRODUODENOSCOPY (EGD) WITH PROPOFOL;  Surgeon: Carol Ada, MD;  Location: [facility];  Service: Endoscopy;  Laterality: N/A;  . FEMORAL HERNIA REPAIR    . IR CHOLANGIOGRAM EXISTING TUBE  05/13/2019  . IR CHOLANGIOGRAM EXISTING TUBE  06/08/2019  . IR EXCHANGE BILIARY DRAIN  05/18/2019  . IR EXCHANGE BILIARY DRAIN  06/08/2019  . IR IMAGING GUIDED PORT INSERTION  03/08/2019  . IR PERC CHOLECYSTOSTOMY  05/04/2019  . LEFT HEART CATH AND CORONARY ANGIOGRAPHY N/A 05/19/2017   Procedure: LEFT HEART CATH AND CORONARY ANGIOGRAPHY;  Surgeon: Leonie Man, MD;  Location: [place] [facility];  Service: Cardiovascular;  Laterality: N/A;  . SPHINCTEROTOMY  01/21/2019   Procedure: SPHINCTEROTOMY;  Surgeon: Carol Ada, MD;  Location: [facility];  Service: Endoscopy;;  . UPPER ESOPHAGEAL ENDOSCOPIC ULTRASOUND (EUS) N/A 01/21/2019   Procedure: UPPER ESOPHAGEAL ENDOSCOPIC ULTRASOUND (EUS);  Surgeon: Carol Ada, MD;  Location: [facility];  Service: Endoscopy;  Laterality: N/A;     reports that he has never smoked. He has never used smokeless tobacco. He reports current alcohol use of about 7.0 standard drinks of alcohol per week. He reports that he does not use drugs.  Allergies  Allergen Reactions  . Erythromycin     Severe Abdominal pain    Family History  Problem Relation Age of Onset  . Diabetes Father     Prior to Admission medications   Medication Sig Start Date End Date Taking? Authorizing Provider  acetaminophen (TYLENOL) 500 MG tablet Take 1,000 mg by mouth 2 (two) times daily as needed for moderate pain or headache.   Yes [provider]  amLODipine (NORVASC) 5 MG  tablet Take 5 mg by mouth daily.    Yes [provider]  atenolol (TENORMIN) 25 MG tablet Take 25 mg by mouth daily. Once a day    Yes [provider]  atorvastatin (LIPITOR) 10 MG tablet Take 10 mg by mouth daily. 05/13/17  Yes [provider]  insulin NPH Human (NOVOLIN N) 100  UNIT/ML injection Inject 20 Units into the skin at bedtime.    Yes [provider]  insulin regular (NOVOLIN R) 100 units/mL injection Inject 20 Units into the skin 3 (three) times daily before meals.    Yes [provider]  lisinopril (ZESTRIL) 20 MG tablet Take 20 mg by mouth daily.  03/14/17  Yes [provider]  magnesium oxide (MAG-OX) 400 MG tablet TAKE 1 TABLET BY MOUTH EVERY DAY Patient taking differently: Take 400 mg by mouth daily.  05/31/19  Yes [name], MD  metFORMIN (GLUCOPHAGE) 1000 MG tablet Take 2,000 mg by mouth every evening.    Yes [provider]  ondansetron (ZOFRAN) 8 MG tablet TAKE 1 TABLET BY MOUTH 2 TIMES DAY AS NEEDED. START ON 3RD DAY AFTER CHEMOTHERAPY Patient taking differently: Take 8 mg by mouth 2 (two) times daily as needed. Start on 3rd day after chemotherapy 04/19/19  Yes [name], MD  oxycodone (OXY-IR) 5 MG capsule Take 1 capsule (5 mg total) by mouth every 6 (six) hours as needed for pain. 05/07/19  Yes [name], MD  prochlorperazine (COMPAZINE) 10 MG tablet TAKE 1 TABLET (10 MG TOTAL) BY MOUTH EVERY 6 (SIX) HOURS AS NEEDED (NAUSEA OR VOMITING). Patient taking differently: Take 10 mg by mouth every 6 (six) hours as needed for nausea or vomiting (Nausea or vomiting).  04/19/19  Yes [name], MD  Blood Glucose Monitoring Suppl (ACCU-CHEK NANO SMARTVIEW) W/DEVICE KIT 1 kit by Does not apply route 2 (two) times daily. Patient taking differently: 1 kit by Does not apply route See admin instructions. Test blood sugars 12x's daily 10/23/12   Denita Lung, MD  glucose blood test strip Test 3 times a day. Patient taking differently: 1 each by Other route See admin instructions. Test 12 times a day. 10/26/12   Denita Lung, MD  lidocaine-prilocaine (EMLA) cream Apply to affected area once Patient not taking: Reported on 06/17/2019 03/08/19   [name], MD  zolpidem (AMBIEN) 5 MG tablet Take 1-2 tablets (5-10 mg total) by mouth at bedtime  as needed for sleep. Patient not taking: Reported on 06/17/2019 04/01/19   [name], MD  citalopram (CELEXA) 20 MG tablet Take 1 tablet (20 mg total) by mouth daily. 02/26/11 12/05/11  Denita Lung, MD  clonazePAM (KLONOPIN) 0.5 MG tablet Take 0.5 mg by mouth 2 (two) times daily as needed.    12/05/11  [provider]  lamoTRIgine (LAMICTAL) 100 MG tablet Take 100 mg by mouth daily. 1/2 TABLET QD   12/05/11  [provider]    Physical Exam: Vitals:   06/17/19 1300  BP: 137/76  Pulse: 67  Resp: 20  Temp: 98.7 F (37.1 C)  TempSrc: Oral  SpO2: 98%  Weight: 66 kg  Height: 5' 7"  (1.702 m)    Constitutional: 73 y.o. male NAD, calm, comfortable Vitals:   06/17/19 1300  BP: 137/76  Pulse: 67  Resp: 20  Temp: 98.7 F (37.1 C)  TempSrc: Oral  SpO2: 98%  Weight: 66 kg  Height: 5' 7"  (1.702 m)   General: 73 y.o. male resting in bed  in NAD Eyes: PERRL, normal sclera ENMT: Nares patent w/o discharge, orophaynx clear, dentition normal, ears w/o discharge/lesions/ulcers Cardiovascular: RRR, +S1, S2, no m/g/r, equal pulses throughout Respiratory: CTABL, no w/r/r, normal WOB GI: BS+, ND, slight RUQ TTP,  no masses noted, no organomegaly noted, midline abdominal scar noted, noninflammed, right side bandaging noted. MSK: No e/c/c Skin: No rashes, bruises, ulcerations noted Neuro: A&O x 3, no focal deficits Psyc: Appropriate interaction and affect, calm/cooperative    Labs on Admission: I have personally reviewed following labs and imaging studies  CBC: Recent Labs  Lab 06/17/19 0758  WBC 11.0*  NEUTROABS 7.7  HGB 9.9*  HCT 30.5*  MCV 91.9  PLT 350   Basic Metabolic Panel: Recent Labs  Lab 06/17/19 0758  NA 130*  K 5.8*  CL 95*  CO2 25  GLUCOSE 358*  BUN 28*  CREATININE 1.52*  CALCIUM 9.1  MG 2.0   GFR: Estimated Creatinine Clearance: 41.6 mL/min (A) (by C-G formula based on SCr of 1.52 mg/dL (H)). Liver Function Tests: Recent Labs  Lab 06/17/19  0758  AST 33  ALT 32  ALKPHOS 59  BILITOT 0.6  PROT 6.9  ALBUMIN 2.7*   No results for input(s): LIPASE, AMYLASE in the last 168 hours. No results for input(s): AMMONIA in the last 168 hours. Coagulation Profile: No results for input(s): INR, PROTIME in the last 168 hours. Cardiac Enzymes: No results for input(s): CKTOTAL, CKMB, CKMBINDEX, TROPONINI in the last 168 hours. BNP (last 3 results) No results for input(s): PROBNP in the last 8760 hours. HbA1C: No results for input(s): HGBA1C in the last 72 hours. CBG: No results for input(s): GLUCAP in the last 168 hours. Lipid Profile: No results for input(s): CHOL, HDL, LDLCALC, TRIG, CHOLHDL, LDLDIRECT in the last 72 hours. Thyroid Function Tests: No results for input(s): TSH, T4TOTAL, FREET4, T3FREE, THYROIDAB in the last 72 hours. Anemia Panel: No results for input(s): VITAMINB12, FOLATE, FERRITIN, TIBC, IRON, RETICCTPCT in the last 72 hours. Urine analysis: No results found for: COLORURINE, APPEARANCEUR, LABSPEC, PHURINE, GLUCOSEU, HGBUR, BILIRUBINUR, KETONESUR, PROTEINUR, UROBILINOGEN, NITRITE, LEUKOCYTESUR  Radiological Exams on Admission: No results found.   Assessment/Plan Principal Problem:   Hyperkalemia Active Problems:   DM2 (diabetes mellitus, type 2) (HCC)   Essential hypertension   Dyslipidemia   Cholangiocarcinoma (HCC)   Abdominal pain   AKI (acute kidney injury) ([place])   Hyperglycemia   Hyponatremia  Hyperkalemia     - repeat CMP     - lokelma     - fluids     - check EKG  Abdominal Pain     - history of multiple cholecystostomies w/ IR per pt report     - check CMP     - check abdominal US     - if [place] suspicious for cholecystits; consult IR for eval for cholecystostomy  DM2/Hyperglycemia     - repeat CMP     - add lantus, SSI  HTN     - resume home atenolol, amlodipine     - hold lisinopril for now d/t AKI  AKI on CKD2     - fluids     - baseline SCr is about 1.2; he's 1.52 at  admission     - monitor  Hyponatremia     - corrected sodium from this AM is about 134 as glucose is high     - fluids     - repeat CMP, monitor  Cholangiocarcinoma     - per  oncology; sent as direct admission from Dr. Ernestina Penna office; she will see in AM     - of note, COVID negative in office  Dislipidemia     - lipitor  DVT prophylaxis: lovenox  Code Status: FULL  Family Communication: None at bedside  Disposition Plan: TBD  Consults called: None  Admission status: Inpatient. Concern for acute decompensation without further in-house intervention.     Jonnie Finner DO [facility] Pager [phone]  If 7PM-7AM, please contact night-coverage [email or web address] Password [facility]  06/17/2019, 4:13 PM

## 2019-06-18 ENCOUNTER — Inpatient Hospital Stay (HOSPITAL_COMMUNITY): Payer: Medicare Other

## 2019-06-18 ENCOUNTER — Ambulatory Visit: Payer: Medicare Other

## 2019-06-18 ENCOUNTER — Telehealth: Payer: Self-pay | Admitting: Nurse Practitioner

## 2019-06-18 DIAGNOSIS — E875 Hyperkalemia: Secondary | ICD-10-CM

## 2019-06-18 DIAGNOSIS — N179 Acute kidney failure, unspecified: Secondary | ICD-10-CM

## 2019-06-18 DIAGNOSIS — I10 Essential (primary) hypertension: Secondary | ICD-10-CM

## 2019-06-18 DIAGNOSIS — D649 Anemia, unspecified: Secondary | ICD-10-CM

## 2019-06-18 DIAGNOSIS — E1140 Type 2 diabetes mellitus with diabetic neuropathy, unspecified: Secondary | ICD-10-CM

## 2019-06-18 DIAGNOSIS — Z794 Long term (current) use of insulin: Secondary | ICD-10-CM

## 2019-06-18 DIAGNOSIS — R1013 Epigastric pain: Secondary | ICD-10-CM

## 2019-06-18 DIAGNOSIS — E785 Hyperlipidemia, unspecified: Secondary | ICD-10-CM

## 2019-06-18 LAB — COMPREHENSIVE METABOLIC PANEL
ALT: 30 U/L (ref 0–44)
AST: 29 U/L (ref 15–41)
Albumin: 2.5 g/dL — ABNORMAL LOW (ref 3.5–5.0)
Alkaline Phosphatase: 44 U/L (ref 38–126)
Anion gap: 7 (ref 5–15)
BUN: 24 mg/dL — ABNORMAL HIGH (ref 8–23)
CO2: 26 mmol/L (ref 22–32)
Calcium: 8.5 mg/dL — ABNORMAL LOW (ref 8.9–10.3)
Chloride: 100 mmol/L (ref 98–111)
Creatinine, Ser: 1.09 mg/dL (ref 0.61–1.24)
GFR calc Af Amer: >60 mL/min (ref >60)
GFR calc non Af Amer: >60 mL/min (ref >60)
Glucose, Bld: 131 mg/dL — ABNORMAL HIGH (ref 70–99)
Potassium: 4.2 mmol/L (ref 3.5–5.1)
Sodium: 133 mmol/L — ABNORMAL LOW (ref 135–145)
Total Bilirubin: 0.5 mg/dL (ref 0.3–1.2)
Total Protein: 6.3 g/dL — ABNORMAL LOW (ref 6.5–8.1)

## 2019-06-18 LAB — CBC
HCT: 27 % — ABNORMAL LOW (ref 39.0–52.0)
Hemoglobin: 8.6 g/dL — ABNORMAL LOW (ref 13.0–17.0)
MCH: 29.7 pg (ref 26.0–34.0)
MCHC: 31.9 g/dL (ref 30.0–36.0)
MCV: 93.1 fL (ref 80.0–100.0)
Platelets: 340 10*3/uL (ref 150–400)
RBC: 2.9 MIL/uL — ABNORMAL LOW (ref 4.22–5.81)
RDW: 16.7 % — ABNORMAL HIGH (ref 11.5–15.5)
WBC: 7.5 10*3/uL (ref 4.0–10.5)
nRBC: 0 % (ref 0.0–0.2)

## 2019-06-18 LAB — GLUCOSE, CAPILLARY
Glucose-Capillary: 136 mg/dL — ABNORMAL HIGH (ref 70–99)
Glucose-Capillary: 224 mg/dL — ABNORMAL HIGH (ref 70–99)
Glucose-Capillary: 245 mg/dL — ABNORMAL HIGH (ref 70–99)
Glucose-Capillary: 80 mg/dL (ref 70–99)

## 2019-06-18 LAB — LIPASE, BLOOD: Lipase: 13 U/L (ref 11–51)

## 2019-06-18 MED ORDER — SODIUM CHLORIDE (PF) 0.9 % IJ SOLN
INTRAMUSCULAR | Status: AC
  Filled 2019-06-18: qty 50

## 2019-06-18 MED ORDER — INSULIN ASPART 100 UNIT/ML ~~LOC~~ SOLN
4.0000 [IU] | Freq: Three times a day (TID) | SUBCUTANEOUS | Status: DC
  Administered 2019-06-18 – 2019-06-20 (×6): 4 [IU] via SUBCUTANEOUS

## 2019-06-18 MED ORDER — IOHEXOL 300 MG/ML  SOLN
100.0000 mL | Freq: Once | INTRAMUSCULAR | Status: AC | PRN
  Administered 2019-06-18: 17:00:00 100 mL via INTRAVENOUS

## 2019-06-18 NOTE — Progress Notes (Signed)
Christopher Welch   DOB:23-May-1946   [id number]   [id number]  Oncology follow up   Subjective: Patient was admitted from my office yesterday for AKI and symptom management.  He feels much better today, was eating lunch when I saw him.  Nausea and pain are much improved, and he walked in hallway.    Objective:  Vitals:   06/18/19 0620 06/18/19 1244  BP: 122/74 131/72  Pulse: 75 83  Resp: 18 (!) 21  Temp: 98.4 F (36.9 C) 97.7 F (36.5 C)  SpO2: 90% 91%    Body mass index is 22.79 kg/m.  Intake/Output Summary (Last 24 hours) at 06/18/2019 1829 Last data filed at 06/18/2019 1645 Gross per 24 hour  Intake 918.8 ml  Output 2 ml  Net 916.8 ml     Sclerae unicteric  Oropharynx clear  No peripheral adenopathy  Lungs clear -- no rales or rhonchi  Heart regular rate and rhythm  Abdomen soft, mild epigastric tenderness   MSK no focal spinal tenderness, no peripheral edema  Neuro nonfocal  CBG (last 3)  Recent Labs    06/18/19 0729 06/18/19 1156 06/18/19 1558  GLUCAP 136* 224* 245*     Labs:  Lab Results  Component Value Date   WBC 7.5 06/18/2019   HGB 8.6 (L) 06/18/2019   HCT 27.0 (L) 06/18/2019   MCV 93.1 06/18/2019   PLT 340 06/18/2019   NEUTROABS 5.0 06/17/2019   Urine Studies No results for input(s): UHGB, CRYS in the last 72 hours.  Invalid input(s): UACOL, UAPR, USPG, UPH, UTP, UGL, UKET, UBIL, UNIT, UROB, ULEU, UEPI, UWBC, Duwayne Heck [place], [place]  Basic Metabolic Panel: Recent Labs  Lab 06/17/19 0758 06/17/19 1700 06/18/19 0456  NA 130* 130* 133*  K 5.8* 4.6 4.2  CL 95* 97* 100  CO2 25 24 26   GLUCOSE 358* 185* 131*  BUN 28* 26* 24*  CREATININE 1.52* 1.20 1.09  CALCIUM 9.1 8.2* 8.5*  MG 2.0  --   --    GFR Estimated Creatinine Clearance: 58 mL/min (by C-G formula based on SCr of 1.09 mg/dL). Liver Function Tests: Recent Labs  Lab 06/17/19 0758 06/17/19 1700 06/18/19 0456  AST 33 31 29  ALT 32 31 30  ALKPHOS 59 45 44  BILITOT  0.6 0.6 0.5  PROT 6.9 6.3* 6.3*  ALBUMIN 2.7* 2.5* 2.5*   Recent Labs  Lab 06/18/19 0456  LIPASE 13   No results for input(s): AMMONIA in the last 168 hours. Coagulation profile No results for input(s): INR, PROTIME in the last 168 hours.  CBC: Recent Labs  Lab 06/17/19 0758 06/17/19 1618 06/18/19 0456  WBC 11.0* 9.4 7.5  NEUTROABS 7.7 5.0  --   HGB 9.9* 8.8* 8.6*  HCT 30.5* 28.2* 27.0*  MCV 91.9 93.4 93.1  PLT 383 386 340   Cardiac Enzymes: No results for input(s): CKTOTAL, CKMB, CKMBINDEX, TROPONINI in the last 168 hours. BNP: Invalid input(s): POCBNP CBG: Recent Labs  Lab 06/17/19 1707 06/17/19 2246 06/18/19 0729 06/18/19 1156 06/18/19 1558  GLUCAP 260* 189* 136* 224* 245*   D-Dimer No results for input(s): DDIMER in the last 72 hours. Hgb A1c No results for input(s): HGBA1C in the last 72 hours. Lipid Profile No results for input(s): CHOL, HDL, LDLCALC, TRIG, CHOLHDL, LDLDIRECT in the last 72 hours. Thyroid function studies No results for input(s): TSH, T4TOTAL, T3FREE, THYROIDAB in the last 72 hours.  Invalid input(s): FREET3 Anemia work up No results for input(s): VITAMINB12, FOLATE,  FERRITIN, TIBC, IRON, RETICCTPCT in the last 72 hours. Microbiology Recent Results (from the past 240 hour(s))  SARS Coronavirus 2 [facility] order, Performed in [facility] hospital lab) Nasopharyngeal Nasopharyngeal Swab     Status: None   Collection Time: 06/17/19 10:53 AM   Specimen: Nasopharyngeal Swab  Result Value Ref Range Status   SARS Coronavirus 2 NEGATIVE NEGATIVE Final    Comment: (NOTE) If result is NEGATIVE SARS-CoV-2 target nucleic acids are NOT DETECTED. The SARS-CoV-2 RNA is generally detectable in upper and lower  respiratory specimens during the acute phase of infection. The lowest  concentration of SARS-CoV-2 viral copies this assay can detect is 250  copies / mL. A negative result does not preclude SARS-CoV-2 infection  and should not be used as  the sole basis for treatment or other  patient management decisions.  A negative result may occur with  improper specimen collection / handling, submission of specimen other  than nasopharyngeal swab, presence of viral mutation(s) within the  areas targeted by this assay, and inadequate number of viral copies  (<250 copies / mL). A negative result must be combined with clinical  observations, patient history, and epidemiological information. If result is POSITIVE SARS-CoV-2 target nucleic acids are DETECTED. The SARS-CoV-2 RNA is generally detectable in upper and lower  respiratory specimens dur ing the acute phase of infection.  Positive  results are indicative of active infection with SARS-CoV-2.  Clinical  correlation with patient history and other diagnostic information is  necessary to determine patient infection status.  Positive results do  not rule out bacterial infection or co-infection with other viruses. If result is PRESUMPTIVE POSTIVE SARS-CoV-2 nucleic acids MAY BE PRESENT.   A presumptive positive result was obtained on the submitted specimen  and confirmed on repeat testing.  While 2019 novel coronavirus  (SARS-CoV-2) nucleic acids may be present in the submitted sample  additional confirmatory testing may be necessary for epidemiological  and / or clinical management purposes  to differentiate between  SARS-CoV-2 and other Sarbecovirus currently known to infect humans.  If clinically indicated additional testing with an alternate test  methodology [id number]) is advised. The SARS-CoV-2 RNA is generally  detectable in upper and lower respiratory sp ecimens during the acute  phase of infection. The expected result is Negative. Fact Sheet for Patients:  StrictlyIdeas.no Fact Sheet for Healthcare Providers: BankingDealers.co.za This test is not yet approved or cleared by the [place] FDA and has been authorized for  detection and/or diagnosis of SARS-CoV-2 by FDA under an Emergency Use Authorization (EUA).  This EUA will remain in effect (meaning this test can be used) for the duration of the COVID-19 declaration under Section 564(b)(1) of the Act, 21 U.S.C. section 360bbb-3(b)(1), unless the authorization is terminated or revoked sooner. Performed at [facility], [place] [address] [place], [place] [address]       Studies:  US Abdomen Complete  Result Date: 06/18/2019 CLINICAL DATA:  Abdominal pain.  Biliary stent. EXAM: ABDOMEN ULTRASOUND COMPLETE COMPARISON:  05/03/2019 and 01/14/2019, abdominal MRI 01/20/2019 FINDINGS: Gallbladder: No significant gallstones or sludge. Resolution of the previous seen moderate amount of gallbladder sludge. Persistent minimal gallbladder wall thickening of 3.5 mm improved. Negative sonographic Murphy sign. No adjacent free fluid. Common bile duct: Diameter: 3.7 mm.  Common bile duct stent present. Liver: No focal lesion identified. Within normal limits in parenchymal echogenicity. Expected mild pneumobilia present due to presence of common bile duct stent. Portal vein is patent on color Doppler  imaging with normal direction of blood flow towards the liver. IVC: No abnormality visualized. Pancreas: Visualized portion unremarkable. Spleen: Size and appearance within normal limits. Right Kidney: Length: 10.8 cm. Echogenicity within normal limits. No mass or hydronephrosis visualized. 2.3 cm simple cyst over the upper pole. 3.8 cm mildly complicated cyst over the mid to upper pole with internal septations slightly smaller and found to represent a cyst by previous MRI. Left Kidney: Length: 10.7 cm. Echogenicity within normal limits. No mass or hydronephrosis visualized. Abdominal aorta: No aneurysm visualized. Other findings: No free fluid.  Right pleural effusion. IMPRESSION: Resolution of previous seen moderate gallbladder sludge. Persistent minimal gallbladder  wall thickening. No additional sonographic evidence to suggest cholecystitis. Biliary stent in place. Two stable right renal cysts. Right pleural effusion. Electronically Signed   By: Marin Olp M.D.   On: 06/18/2019 09:01   Ct Abdomen Pelvis W Contrast  Result Date: 06/18/2019 CLINICAL DATA:  Acute generalized abdominal pain. History of cholangiocarcinoma. Nausea and vomiting. Biliary drain removed 06/08/2019. EXAM: CT ABDOMEN AND PELVIS WITH CONTRAST TECHNIQUE: Multidetector CT imaging of the abdomen and pelvis was performed using the standard protocol following bolus administration of intravenous contrast. CONTRAST:  136mL OMNIPAQUE IOHEXOL 300 MG/ML  SOLN COMPARISON:  Abdominal ultrasound today.  Abdominal MRI 01/20/2019. FINDINGS: Lower chest: Moderate size right pleural effusion with associated compressive atelectasis. Left lung bases clear. Central venous catheter with tip over the cavoatrial junction. Calcified plaque over the coronary arteries. Hepatobiliary: Gallbladder slightly contracted with possible single stone present. Biliary stent in place and appears in adequate position with air density throughout the stent. Mild pneumobilia present. Subcentimeter cyst over the central aspect of the right lobe of the liver unchanged. No focal solid liver mass. Pancreas: Mild atrophy of the pancreas with calcifications throughout the pancreas likely due to previous pancreatitis. Biliary stent seen over the head of the pancreas. Spleen: Normal. Adrenals/Urinary Tract: Adrenal glands are normal. Kidneys are normal in size with several right renal cysts unchanged. Ureters and bladder are normal. Stomach/Bowel: Stomach and small bowel are normal. Previous appendectomy. Mild fecal retention throughout the colon. Vascular/Lymphatic: Mild calcified plaque over the abdominal aorta which is normal in caliber. No adenopathy. Reproductive: Normal. Other: No free fluid or focal inflammatory change. Small left inguinal  hernia containing only peritoneal fat. Musculoskeletal: Minimal degenerative change of the spine. IMPRESSION: 1.  No acute findings in the abdomen/pelvis. 2. Biliary stent in adequate position. No evidence of focal mass or adenopathy in the region of the pancreatic head or porta hepatis. 3. Moderate size right pleural effusion with associated right basilar atelectasis. 4. Possible single gallstone. Stable subcentimeter liver cyst. Stable right renal cysts. Small left inguinal hernia containing only peritoneal fat. 5. Aortic Atherosclerosis (ICD10-I70.0). Electronically Signed   By: Marin Olp M.D.   On: 06/18/2019 17:18    Assessment: 73 y.o.  1. AKI from dehydration  2. Nausea, abdominal pain and low appetite  3.  Locally advanced extrahepatic cholangiocarcinoma, on chemo  4.  History of acute cholecystitis 5. DM 6. CAD, HTN   Plan:  -Lab reviewed, AKI and hyperkalemia resolved.  He is clinically doing much better -I reviewed today's ultrasound, which was negative for acute cholecystitis.  -I recommend a CT abdomen pelvis with contrast, to rule out cancer progression or other etiology of his GI symptoms  -If he continue to do well clinically, and CT negative for acute issues, OK to discharge home tomorrow from caner standpoint appreciate the excellent care of hospitalist  team    [name], MD 06/18/2019  6:29 PM

## 2019-06-18 NOTE — Telephone Encounter (Signed)
No los per 9/17 °

## 2019-06-18 NOTE — Progress Notes (Signed)
Inpatient Diabetes Program Recommendations  AACE/ADA: New Consensus Statement on Inpatient Glycemic Control (2015)  Target Ranges:  Prepandial:   less than 140 mg/dL      Peak postprandial:   less than 180 mg/dL (1-2 hours)      Critically ill patients:  140 - 180 mg/dL   Lab Results  Component Value Date   GLUCAP 224 (H) 06/18/2019   HGBA1C 7.5 (H) 05/05/2019    Review of Glycemic Control  Diabetes history: DM2 Outpatient Diabetes medications: Novolin N 20 units QHS, Novolin R 20 units tidwc, metformin 2000 mg QPM Current orders for Inpatient glycemic control: NPH 20 units QHS, Novolog 0-9 units tidwc  HgbA1C - 7.5% Now eating 100%.  Inpatient Diabetes Program Recommendations:     Add Novolog 4 units tidwc for meal coverage insulin if pt eats > 50% meal.  Will follow closely.  Thank you. Lorenda Peck, RD, LDN, CDE Inpatient Diabetes Coordinator [phone]

## 2019-06-18 NOTE — Progress Notes (Signed)
[facility]  PROGRESS NOTE  Christopher Welch [id number] DOB: 1946-04-05 DOA: 06/17/2019 PCP: [name], DO  Brief History    Christopher Welch is a 73 y.o. year old male with medical history significant for type 2 diabetes, HTN, extrahepatic cholangiocarcinoma who presented on 06/17/2019 is a direct admission from his oncology office with complaints of abdominal pain and was found to have hyperkalemia of 5.8, hyponatremia 130, hyperglycemia (358), AKI (1.5 to creatinine) and persistent abdominal pain with nausea and vomiting concerning for recurrent cholecystitis.   A & P     Non-intractable nausea/vomiting with abdominal pain x 1 week.  Initial concern for cholecystitis given prior history right upper quadrant ultrasound within normal limits, Lipase also wnl..  Will pursue CT abdomen to ensure no progression of known extrahepatic cholangiocarcinoma disease.  Patient currently tolerating oral diet this morning, closely monitor to ensure can keep this up, continue pain control, IV antiemetics PRN   Extrahepatic cholangiocarcinoma, diagnosed 12/2018.  Followed by Dr. Burr Medico who will see in hospital.   Prior cholecystitis.  Previous percutaneous gallbladder drainage tube placed by IR complicated by bile leakage in the past, last removed on 9/8.  Right upper quadrant ultrasound shows no recurrent cholecystitis persistent gallbladder sludge     AKI, resolved.  Baseline creatinine 1-1.2.  Elevated 1.5 to admission-setting of dehydration related to poor oral intake and persistent nausea/vomiting.  Has improved with IV fluids.  Will monitor on oral diet.  Closely monitor BMP   Hyperkalemia.  Resolved (peak of 5.8) status post Kayexalate, closely monitor.   Hyponatremia, resolved.  Nadir of 130 (corrected in setting of hyp0erglycemia 134), currently 133, responded well to IV fluids, will monitor on oral diet.  Daily BMP.   Type II diabetes, A1c 7.5 (8/20).  Now the patient is tolerating some  oral diet will continue long-acting insulin, NPH 20 units at bedtime, sliding scale as needed.  Holding home metformin   Hypertension, stable.  Continue home amlodipine, atenolol   Hyperlipidemia, stable.  Continue home atorvastatin   Chronic normocytic anemia, stable.  Likely anemia of chronic disease related to malignancy.  Baseline hemoglobin 9.8-10.  Currently 8.8, may be some element of dilution related to IV fluids.  No signs or symptoms of bleeding.  Continue to closely monitor.     DVT prophylaxis: Lovenox Code Status: Full Family Communication: No family at bedside Disposition Plan: Close monitor ability to tolerate p.o., monitor hydration status of IV fluids, continue to monitor creatinine, electrolytes, abdominal pain/CT abdomen imaging.    [facility] Direct contact: see [email or web address] (further directions at bottom of note if needed) 7PM-7AM contact night coverage as at bottom of note 06/18/2019, 2:44 PM  LOS: 1 day   Consultants   Oncology  Procedures   None  Antibiotics   None  Interval History/Subjective  This morning is the best he is felt in a week Was able to eat all of his breakfast without any abdominal pain Feels his belly pain is much better  Objective   Vitals:  Vitals:   06/18/19 0620 06/18/19 1244  BP: 122/74 131/72  Pulse: 75 83  Resp: 18 (!) 21  Temp: 98.4 F (36.9 C) 97.7 F (36.5 C)  SpO2: 90% 91%    Exam:  Awake Alert, Oriented X 3, No new F.N deficits, Normal affect [place].AT Symmetrical Chest wall movement, Good air movement bilaterally, CTAB RRR,No Gallops,Rubs or new Murmurs,  +ve B.Sounds, Abd Soft, No tenderness, dressing in place in right upper quadrant at  site of previous cholecystostomy tube, clean dry and intact). No Cyanosis, Clubbing or edema, No new Rash or bruise Right-sided port site clean dry and intact with no surrounding erythema   I have personally reviewed the following:   Data Reviewed: Basic  Metabolic Panel: Recent Labs  Lab 06/17/19 0758 06/17/19 1700 06/18/19 0456  NA 130* 130* 133*  K 5.8* 4.6 4.2  CL 95* 97* 100  CO2 25 24 26   GLUCOSE 358* 185* 131*  BUN 28* 26* 24*  CREATININE 1.52* 1.20 1.09  CALCIUM 9.1 8.2* 8.5*  MG 2.0  --   --    Liver Function Tests: Recent Labs  Lab 06/17/19 0758 06/17/19 1700 06/18/19 0456  AST 33 31 29  ALT 32 31 30  ALKPHOS 59 45 44  BILITOT 0.6 0.6 0.5  PROT 6.9 6.3* 6.3*  ALBUMIN 2.7* 2.5* 2.5*   Recent Labs  Lab 06/18/19 0456  LIPASE 13   No results for input(s): AMMONIA in the last 168 hours. CBC: Recent Labs  Lab 06/17/19 0758 06/17/19 1618 06/18/19 0456  WBC 11.0* 9.4 7.5  NEUTROABS 7.7 5.0  --   HGB 9.9* 8.8* 8.6*  HCT 30.5* 28.2* 27.0*  MCV 91.9 93.4 93.1  PLT 383 386 340   Cardiac Enzymes: No results for input(s): CKTOTAL, CKMB, CKMBINDEX, TROPONINI in the last 168 hours. BNP (last 3 results) No results for input(s): BNP in the last 8760 hours.  ProBNP (last 3 results) No results for input(s): PROBNP in the last 8760 hours.  CBG: Recent Labs  Lab 06/17/19 1707 06/17/19 2246 06/18/19 0729 06/18/19 1156  GLUCAP 260* 189* 136* 224*    Recent Results (from the past 240 hour(s))  SARS Coronavirus 2 [facility] order, Performed in [facility] hospital lab) Nasopharyngeal Nasopharyngeal Swab     Status: None   Collection Time: 06/17/19 10:53 AM   Specimen: Nasopharyngeal Swab  Result Value Ref Range Status   SARS Coronavirus 2 NEGATIVE NEGATIVE Final    Comment: (NOTE) If result is NEGATIVE SARS-CoV-2 target nucleic acids are NOT DETECTED. The SARS-CoV-2 RNA is generally detectable in upper and lower  respiratory specimens during the acute phase of infection. The lowest  concentration of SARS-CoV-2 viral copies this assay can detect is 250  copies / mL. A negative result does not preclude SARS-CoV-2 infection  and should not be used as the sole basis for treatment or other  patient management  decisions.  A negative result may occur with  improper specimen collection / handling, submission of specimen other  than nasopharyngeal swab, presence of viral mutation(s) within the  areas targeted by this assay, and inadequate number of viral copies  (<250 copies / mL). A negative result must be combined with clinical  observations, patient history, and epidemiological information. If result is POSITIVE SARS-CoV-2 target nucleic acids are DETECTED. The SARS-CoV-2 RNA is generally detectable in upper and lower  respiratory specimens dur ing the acute phase of infection.  Positive  results are indicative of active infection with SARS-CoV-2.  Clinical  correlation with patient history and other diagnostic information is  necessary to determine patient infection status.  Positive results do  not rule out bacterial infection or co-infection with other viruses. If result is PRESUMPTIVE POSTIVE SARS-CoV-2 nucleic acids MAY BE PRESENT.   A presumptive positive result was obtained on the submitted specimen  and confirmed on repeat testing.  While 2019 novel coronavirus  (SARS-CoV-2) nucleic acids may be present in the submitted sample  additional  confirmatory testing may be necessary for epidemiological  and / or clinical management purposes  to differentiate between  SARS-CoV-2 and other Sarbecovirus currently known to infect humans.  If clinically indicated additional testing with an alternate test  methodology [phone]) is advised. The SARS-CoV-2 RNA is generally  detectable in upper and lower respiratory sp ecimens during the acute  phase of infection. The expected result is Negative. Fact Sheet for Patients:  StrictlyIdeas.no Fact Sheet for Healthcare Providers: BankingDealers.co.za This test is not yet approved or cleared by the [place] FDA and has been authorized for detection and/or diagnosis of SARS-CoV-2 by FDA under an  Emergency Use Authorization (EUA).  This EUA will remain in effect (meaning this test can be used) for the duration of the COVID-19 declaration under Section 564(b)(1) of the Act, 21 U.S.C. section 360bbb-3(b)(1), unless the authorization is terminated or revoked sooner. Performed at [facility], [place] [address], [place], [place] [address]      Studies: US Abdomen Complete  Result Date: 06/18/2019 CLINICAL DATA:  Abdominal pain.  Biliary stent. EXAM: ABDOMEN ULTRASOUND COMPLETE COMPARISON:  05/03/2019 and 01/14/2019, abdominal MRI 01/20/2019 FINDINGS: Gallbladder: No significant gallstones or sludge. Resolution of the previous seen moderate amount of gallbladder sludge. Persistent minimal gallbladder wall thickening of 3.5 mm improved. Negative sonographic Murphy sign. No adjacent free fluid. Common bile duct: Diameter: 3.7 mm.  Common bile duct stent present. Liver: No focal lesion identified. Within normal limits in parenchymal echogenicity. Expected mild pneumobilia present due to presence of common bile duct stent. Portal vein is patent on color Doppler imaging with normal direction of blood flow towards the liver. IVC: No abnormality visualized. Pancreas: Visualized portion unremarkable. Spleen: Size and appearance within normal limits. Right Kidney: Length: 10.8 cm. Echogenicity within normal limits. No mass or hydronephrosis visualized. 2.3 cm simple cyst over the upper pole. 3.8 cm mildly complicated cyst over the mid to upper pole with internal septations slightly smaller and found to represent a cyst by previous MRI. Left Kidney: Length: 10.7 cm. Echogenicity within normal limits. No mass or hydronephrosis visualized. Abdominal aorta: No aneurysm visualized. Other findings: No free fluid.  Right pleural effusion. IMPRESSION: Resolution of previous seen moderate gallbladder sludge. Persistent minimal gallbladder wall thickening. No additional sonographic evidence to suggest  cholecystitis. Biliary stent in place. Two stable right renal cysts. Right pleural effusion. Electronically Signed   By: Marin Olp M.D.   On: 06/18/2019 09:01    Scheduled Meds:  amLODipine  5 mg Oral Daily   atenolol  25 mg Oral Daily   atorvastatin  10 mg Oral Daily   enoxaparin (LOVENOX) injection  40 mg Subcutaneous Q24H   insulin aspart  0-9 Units Subcutaneous TID WC   insulin NPH Human  20 Units Subcutaneous QHS   multivitamin with minerals  1 tablet Oral Daily   Continuous Infusions:  Principal Problem:   Hyperkalemia Active Problems:   DM2 (diabetes mellitus, type 2) (HCC)   Essential hypertension   Dyslipidemia   Cholangiocarcinoma ([place])   Abdominal pain   AKI (acute kidney injury) ([place])   Hyperglycemia   Hyponatremia      Nur Rabold D Aryahna Spagna  [facility]

## 2019-06-19 DIAGNOSIS — E871 Hypo-osmolality and hyponatremia: Secondary | ICD-10-CM

## 2019-06-19 LAB — GLUCOSE, CAPILLARY
Glucose-Capillary: 164 mg/dL — ABNORMAL HIGH (ref 70–99)
Glucose-Capillary: 181 mg/dL — ABNORMAL HIGH (ref 70–99)
Glucose-Capillary: 205 mg/dL — ABNORMAL HIGH (ref 70–99)
Glucose-Capillary: 212 mg/dL — ABNORMAL HIGH (ref 70–99)
Glucose-Capillary: 254 mg/dL — ABNORMAL HIGH (ref 70–99)
Glucose-Capillary: 326 mg/dL — ABNORMAL HIGH (ref 70–99)

## 2019-06-19 LAB — CBC
HCT: 30.4 % — ABNORMAL LOW (ref 39.0–52.0)
Hemoglobin: 9.7 g/dL — ABNORMAL LOW (ref 13.0–17.0)
MCH: 29.6 pg (ref 26.0–34.0)
MCHC: 31.9 g/dL (ref 30.0–36.0)
MCV: 92.7 fL (ref 80.0–100.0)
Platelets: 457 10*3/uL — ABNORMAL HIGH (ref 150–400)
RBC: 3.28 MIL/uL — ABNORMAL LOW (ref 4.22–5.81)
RDW: 16.3 % — ABNORMAL HIGH (ref 11.5–15.5)
WBC: 8.7 10*3/uL (ref 4.0–10.5)
nRBC: 0 % (ref 0.0–0.2)

## 2019-06-19 LAB — BASIC METABOLIC PANEL WITH GFR
Anion gap: 9 (ref 5–15)
BUN: 20 mg/dL (ref 8–23)
CO2: 25 mmol/L (ref 22–32)
Calcium: 8.5 mg/dL — ABNORMAL LOW (ref 8.9–10.3)
Chloride: 93 mmol/L — ABNORMAL LOW (ref 98–111)
Creatinine, Ser: 1.09 mg/dL (ref 0.61–1.24)
GFR calc Af Amer: >60 mL/min
GFR calc non Af Amer: >60 mL/min
Glucose, Bld: 346 mg/dL — ABNORMAL HIGH (ref 70–99)
Potassium: 4.4 mmol/L (ref 3.5–5.1)
Sodium: 127 mmol/L — ABNORMAL LOW (ref 135–145)

## 2019-06-19 MED ORDER — SODIUM CHLORIDE 0.9% FLUSH: 10.0000 mL | INTRAVENOUS | Status: DC | PRN

## 2019-06-19 MED ORDER — SENNOSIDES-DOCUSATE SODIUM 8.6-50 MG PO TABS
1.0000 | ORAL_TABLET | Freq: Two times a day (BID) | ORAL | Status: DC
  Administered 2019-06-19 – 2019-06-20 (×2): 1 via ORAL
  Filled 2019-06-19 (×3): qty 1

## 2019-06-19 MED ORDER — SODIUM CHLORIDE 0.9 % IV SOLN
INTRAVENOUS | Status: AC
  Administered 2019-06-19 – 2019-06-20 (×2): via INTRAVENOUS

## 2019-06-19 MED ORDER — CHLORHEXIDINE GLUCONATE CLOTH 2 % EX PADS
6.0000 | MEDICATED_PAD | Freq: Every day | CUTANEOUS | Status: DC
  Administered 2019-06-19: 10:00:00 6 via TOPICAL

## 2019-06-19 NOTE — Progress Notes (Signed)
[facility]  PROGRESS NOTE  CODDY MANSIR [id number] DOB: [birth year] DOA: 06/17/2019 PCP: [name], DO  Brief History    Christopher Welch is a 73 y.o. year old male with medical history significant for type 2 diabetes, HTN, extrahepatic cholangiocarcinoma who presented on 06/17/2019 is a direct admission from his oncology office with complaints of abdominal pain and was found to have hyperkalemia of 5.8, hyponatremia 130, hyperglycemia (358), AKI (1.5 to creatinine) and persistent abdominal pain with nausea and vomiting concerning for recurrent cholecystitis.   A & P     Non-intractable nausea/vomiting with abdominal pain x 1 week.  Initial concern for cholecystitis given prior history right upper quadrant ultrasound and CT abdomen with no acute abnormalities., Lipase also wnl..  Patient currently tolerating oral diet this morning though still a bit queasy, closely monitor to ensure can keep this up, continue pain control, IV antiemetics PRN   Extrahepatic cholangiocarcinoma, diagnosed 12/2018.  Followed by Dr. Burr Medico who will see in hospital.   Prior cholecystitis.  Previous percutaneous gallbladder drainage tube placed by IR complicated by bile leakage in the past, last removed on 9/8.  Right upper quadrant ultrasound shows no recurrent cholecystitis persistent gallbladder sludge     AKI, resolved.  Baseline creatinine 1-1.2.  Elevated 1.5 to admission-setting of dehydration related to poor oral intake and persistent nausea/vomiting.  Has improved with IV fluids.  Will monitor on oral diet.  Closely monitor BMP   Hyperkalemia.  Resolved (peak of 5.8) status post Kayexalate, closely monitor.   Hyponatremia, reoccured.  Nadir of 130 (corrected in setting of hyperglycemia 134), repeat today sodium of 127, previous baseline 130s to 133.  Glucose is under better control genetic this is contributing.  Suspect this is most likely related to low volume status in setting of diminished  p.o. intake and previous nausea/vomiting, will reinitiate IV fluids and repeat in a.m., patient is asymptomatic.     Type II diabetes, A1c 7.5 (8/20).  Now the patient is tolerating some oral diet will continue long-acting insulin, NPH 20 units at bedtime, sliding scale as needed.  Holding home metformin   Hypertension, stable.  Continue home amlodipine, atenolol   Hyperlipidemia, stable.  Continue home atorvastatin   Chronic normocytic anemia, stable.  Likely anemia of chronic disease related to malignancy.  Baseline hemoglobin 9.8-10.  Currently 8.8, may be some element of dilution related to IV fluids.  No signs or symptoms of bleeding.  Continue to closely monitor.     DVT prophylaxis: Lovenox Code Status: Full Family Communication: No family at bedside Disposition Plan: Close monitor ability to tolerate p.o., monitor sodium levels while on IV fluids,    [facility] Direct contact: see [email or web address] (further directions at bottom of note if needed) 7PM-7AM contact night coverage as at bottom of note 06/19/2019, 6:32 PM  LOS: 2 days   Consultants   Oncology  Procedures   None  Antibiotics   None  Interval History/Subjective  Ate breakfast okay Still feels a bit queasy Some nausea Minimal abdominal pain  Objective   Vitals:  Vitals:   06/19/19 0452 06/19/19 1416  BP: 138/76 116/69  Pulse: 89 84  Resp: 20 16  Temp: 99.7 F (37.6 C) 99.3 F (37.4 C)  SpO2: 94% 91%    Exam:  Awake Alert, Oriented X 3, No new F.N deficits, Normal affect [place].AT Symmetrical Chest wall movement, Good air movement bilaterally, CTAB RRR,No Gallops,Rubs or new Murmurs,  +ve B.Sounds, Abd Soft, No tenderness, dressing  in place in right upper quadrant at site of previous cholecystostomy tube, clean dry and intact). No Cyanosis, Clubbing or edema, No new Rash or bruise Right-sided port site clean dry and intact with no surrounding erythema   I have personally reviewed the  following:   Data Reviewed: Basic Metabolic Panel: Recent Labs  Lab 06/17/19 0758 06/17/19 1700 06/18/19 0456 06/19/19 1058  NA 130* 130* 133* 127*  K 5.8* 4.6 4.2 4.4  CL 95* 97* 100 93*  CO2 25 24 26 25   GLUCOSE 358* 185* 131* 346*  BUN 28* 26* 24* 20  CREATININE 1.52* 1.20 1.09 1.09  CALCIUM 9.1 8.2* 8.5* 8.5*  MG 2.0  --   --   --    Liver Function Tests: Recent Labs  Lab 06/17/19 0758 06/17/19 1700 06/18/19 0456  AST 33 31 29  ALT 32 31 30  ALKPHOS 59 45 44  BILITOT 0.6 0.6 0.5  PROT 6.9 6.3* 6.3*  ALBUMIN 2.7* 2.5* 2.5*   Recent Labs  Lab 06/18/19 0456  LIPASE 13   No results for input(s): AMMONIA in the last 168 hours. CBC: Recent Labs  Lab 06/17/19 0758 06/17/19 1618 06/18/19 0456 06/19/19 1058  WBC 11.0* 9.4 7.5 8.7  NEUTROABS 7.7 5.0  --   --   HGB 9.9* 8.8* 8.6* 9.7*  HCT 30.5* 28.2* 27.0* 30.4*  MCV 91.9 93.4 93.1 92.7  PLT 383 386 340 457*   Cardiac Enzymes: No results for input(s): CKTOTAL, CKMB, CKMBINDEX, TROPONINI in the last 168 hours. BNP (last 3 results) No results for input(s): BNP in the last 8760 hours.  ProBNP (last 3 results) No results for input(s): PROBNP in the last 8760 hours.  CBG: Recent Labs  Lab 06/19/19 0020 06/19/19 0447 06/19/19 0742 06/19/19 1154 06/19/19 1625  GLUCAP 164* 181* 212* 326* 205*    Recent Results (from the past 240 hour(s))  SARS Coronavirus 2 [facility] order, Performed in [facility] hospital lab) Nasopharyngeal Nasopharyngeal Swab     Status: None   Collection Time: 06/17/19 10:53 AM   Specimen: Nasopharyngeal Swab  Result Value Ref Range Status   SARS Coronavirus 2 NEGATIVE NEGATIVE Final    Comment: (NOTE) If result is NEGATIVE SARS-CoV-2 target nucleic acids are NOT DETECTED. The SARS-CoV-2 RNA is generally detectable in upper and lower  respiratory specimens during the acute phase of infection. The lowest  concentration of SARS-CoV-2 viral copies this assay can detect is 250    copies / mL. A negative result does not preclude SARS-CoV-2 infection  and should not be used as the sole basis for treatment or other  patient management decisions.  A negative result may occur with  improper specimen collection / handling, submission of specimen other  than nasopharyngeal swab, presence of viral mutation(s) within the  areas targeted by this assay, and inadequate number of viral copies  (<250 copies / mL). A negative result must be combined with clinical  observations, patient history, and epidemiological information. If result is POSITIVE SARS-CoV-2 target nucleic acids are DETECTED. The SARS-CoV-2 RNA is generally detectable in upper and lower  respiratory specimens dur ing the acute phase of infection.  Positive  results are indicative of active infection with SARS-CoV-2.  Clinical  correlation with patient history and other diagnostic information is  necessary to determine patient infection status.  Positive results do  not rule out bacterial infection or co-infection with other viruses. If result is PRESUMPTIVE POSTIVE SARS-CoV-2 nucleic acids MAY BE PRESENT.  A presumptive positive result was obtained on the submitted specimen  and confirmed on repeat testing.  While 2019 novel coronavirus  (SARS-CoV-2) nucleic acids may be present in the submitted sample  additional confirmatory testing may be necessary for epidemiological  and / or clinical management purposes  to differentiate between  SARS-CoV-2 and other Sarbecovirus currently known to infect humans.  If clinically indicated additional testing with an alternate test  methodology [phone]) is advised. The SARS-CoV-2 RNA is generally  detectable in upper and lower respiratory sp ecimens during the acute  phase of infection. The expected result is Negative. Fact Sheet for Patients:  StrictlyIdeas.no Fact Sheet for Healthcare  Providers: BankingDealers.co.za This test is not yet approved or cleared by the [place] FDA and has been authorized for detection and/or diagnosis of SARS-CoV-2 by FDA under an Emergency Use Authorization (EUA).  This EUA will remain in effect (meaning this test can be used) for the duration of the COVID-19 declaration under Section 564(b)(1) of the Act, 21 U.S.C. section 360bbb-3(b)(1), unless the authorization is terminated or revoked sooner. Performed at [facility], [place] [address] [place], [place] [address]      Studies: US Abdomen Complete  Result Date: 06/18/2019 CLINICAL DATA:  Abdominal pain.  Biliary stent. EXAM: ABDOMEN ULTRASOUND COMPLETE COMPARISON:  05/03/2019 and 01/14/2019, abdominal MRI 01/20/2019 FINDINGS: Gallbladder: No significant gallstones or sludge. Resolution of the previous seen moderate amount of gallbladder sludge. Persistent minimal gallbladder wall thickening of 3.5 mm improved. Negative sonographic Murphy sign. No adjacent free fluid. Common bile duct: Diameter: 3.7 mm.  Common bile duct stent present. Liver: No focal lesion identified. Within normal limits in parenchymal echogenicity. Expected mild pneumobilia present due to presence of common bile duct stent. Portal vein is patent on color Doppler imaging with normal direction of blood flow towards the liver. IVC: No abnormality visualized. Pancreas: Visualized portion unremarkable. Spleen: Size and appearance within normal limits. Right Kidney: Length: 10.8 cm. Echogenicity within normal limits. No mass or hydronephrosis visualized. 2.3 cm simple cyst over the upper pole. 3.8 cm mildly complicated cyst over the mid to upper pole with internal septations slightly smaller and found to represent a cyst by previous MRI. Left Kidney: Length: 10.7 cm. Echogenicity within normal limits. No mass or hydronephrosis visualized. Abdominal aorta: No aneurysm visualized. Other  findings: No free fluid.  Right pleural effusion. IMPRESSION: Resolution of previous seen moderate gallbladder sludge. Persistent minimal gallbladder wall thickening. No additional sonographic evidence to suggest cholecystitis. Biliary stent in place. Two stable right renal cysts. Right pleural effusion. Electronically Signed   By: Marin Olp M.D.   On: 06/18/2019 09:01   Ct Abdomen Pelvis W Contrast  Result Date: 06/18/2019 CLINICAL DATA:  Acute generalized abdominal pain. History of cholangiocarcinoma. Nausea and vomiting. Biliary drain removed 06/08/2019. EXAM: CT ABDOMEN AND PELVIS WITH CONTRAST TECHNIQUE: Multidetector CT imaging of the abdomen and pelvis was performed using the standard protocol following bolus administration of intravenous contrast. CONTRAST:  19mL OMNIPAQUE IOHEXOL 300 MG/ML  SOLN COMPARISON:  Abdominal ultrasound today.  Abdominal MRI 01/20/2019. FINDINGS: Lower chest: Moderate size right pleural effusion with associated compressive atelectasis. Left lung bases clear. Central venous catheter with tip over the cavoatrial junction. Calcified plaque over the coronary arteries. Hepatobiliary: Gallbladder slightly contracted with possible single stone present. Biliary stent in place and appears in adequate position with air density throughout the stent. Mild pneumobilia present. Subcentimeter cyst over the central aspect of the right lobe of the liver unchanged.  No focal solid liver mass. Pancreas: Mild atrophy of the pancreas with calcifications throughout the pancreas likely due to previous pancreatitis. Biliary stent seen over the head of the pancreas. Spleen: Normal. Adrenals/Urinary Tract: Adrenal glands are normal. Kidneys are normal in size with several right renal cysts unchanged. Ureters and bladder are normal. Stomach/Bowel: Stomach and small bowel are normal. Previous appendectomy. Mild fecal retention throughout the colon. Vascular/Lymphatic: Mild calcified plaque over the  abdominal aorta which is normal in caliber. No adenopathy. Reproductive: Normal. Other: No free fluid or focal inflammatory change. Small left inguinal hernia containing only peritoneal fat. Musculoskeletal: Minimal degenerative change of the spine. IMPRESSION: 1.  No acute findings in the abdomen/pelvis. 2. Biliary stent in adequate position. No evidence of focal mass or adenopathy in the region of the pancreatic head or porta hepatis. 3. Moderate size right pleural effusion with associated right basilar atelectasis. 4. Possible single gallstone. Stable subcentimeter liver cyst. Stable right renal cysts. Small left inguinal hernia containing only peritoneal fat. 5. Aortic Atherosclerosis (ICD10-I70.0). Electronically Signed   By: Marin Olp M.D.   On: 06/18/2019 17:18    Scheduled Meds:  amLODipine  5 mg Oral Daily   atenolol  25 mg Oral Daily   atorvastatin  10 mg Oral Daily   Chlorhexidine Gluconate Cloth  6 each Topical Daily   enoxaparin (LOVENOX) injection  40 mg Subcutaneous Q24H   insulin aspart  0-9 Units Subcutaneous TID WC   insulin aspart  4 Units Subcutaneous TID WC   insulin NPH Human  20 Units Subcutaneous QHS   multivitamin with minerals  1 tablet Oral Daily   senna-docusate  1 tablet Oral BID   Continuous Infusions:  Principal Problem:   Hyperkalemia Active Problems:   DM2 (diabetes mellitus, type 2) (HCC)   Hyperlipidemia with target low density lipoprotein (LDL) cholesterol less than 70 mg/dL   Essential hypertension   Dyslipidemia   Cholangiocarcinoma ([place])   Port-A-Cath in place   Anemia   Abdominal pain   AKI (acute kidney injury) ([place])   Hyperglycemia   Hyponatremia      Jsaon Yoo D Demetris Capell  [facility]

## 2019-06-20 DIAGNOSIS — Z95828 Presence of other vascular implants and grafts: Secondary | ICD-10-CM

## 2019-06-20 DIAGNOSIS — R112 Nausea with vomiting, unspecified: Secondary | ICD-10-CM | POA: Diagnosis present

## 2019-06-20 DIAGNOSIS — R739 Hyperglycemia, unspecified: Secondary | ICD-10-CM

## 2019-06-20 DIAGNOSIS — C221 Intrahepatic bile duct carcinoma: Secondary | ICD-10-CM

## 2019-06-20 LAB — BASIC METABOLIC PANEL
Anion gap: 8 (ref 5–15)
BUN: 17 mg/dL (ref 8–23)
CO2: 26 mmol/L (ref 22–32)
Calcium: 8.6 mg/dL — ABNORMAL LOW (ref 8.9–10.3)
Chloride: 98 mmol/L (ref 98–111)
Creatinine, Ser: 1.07 mg/dL (ref 0.61–1.24)
GFR calc Af Amer: >60 mL/min (ref >60)
GFR calc non Af Amer: >60 mL/min (ref >60)
Glucose, Bld: 263 mg/dL — ABNORMAL HIGH (ref 70–99)
Potassium: 4.7 mmol/L (ref 3.5–5.1)
Sodium: 132 mmol/L — ABNORMAL LOW (ref 135–145)

## 2019-06-20 LAB — GLUCOSE, CAPILLARY
Glucose-Capillary: 267 mg/dL — ABNORMAL HIGH (ref 70–99)
Glucose-Capillary: 311 mg/dL — ABNORMAL HIGH (ref 70–99)

## 2019-06-20 MED ORDER — HEPARIN SOD (PORK) LOCK FLUSH 100 UNIT/ML IV SOLN
500.0000 [IU] | Freq: Once | INTRAVENOUS | Status: AC
  Administered 2019-06-20: 500 [IU] via INTRAVENOUS
  Filled 2019-06-20: qty 5

## 2019-06-20 NOTE — Discharge Summary (Signed)
Christopher Welch VCB:[id number] DOB: [birth year] DOA: 06/17/2019  PCP: [name], DO  Admit date: 06/17/2019 Discharge date: 06/20/2019  Admitted From: Home Disposition: Home  Recommendations for Outpatient Follow-up:  1. Follow up with PCP in 1-2 weeks 2. Please obtain BMP in one week   Home Health: No Equipment/Devices: None  Discharge Condition: Stable  CODE STATUS: Full  Diet recommendation: Carb modified  Brief/Interim Summary: History of present illness:  Christopher Welch is a 73 y.o. year old male with medical history significant for type 2 diabetes, HTN, extrahepatic cholangiocarcinoma who presented on 06/17/2019 is a direct admission from his oncology office with complaints of abdominal pain and was found to have hyperkalemia of 5.8, hyponatremia 130, hyperglycemia (358), AKI (1.5 to creatinine) and persistent abdominal pain with nausea and vomiting concerning for recurrent cholecystitis.    Remaining hospital course addressed in problem based format below:   Hospital Course:    Non-intractable nausea/vomiting with abdominal pain x 1 week, resolved.  Initial concern for cholecystitis given prior history.  Right upper quadrant ultrasound and CT abdomen with no acute abnormalities., Lipase also wnl.  Was treated supportively with IV fluids, antiemetics, pain control.  Prior to discharge patient was able to tolerate oral diet.  Advised to continue PRN use of oral antiemetics with close follow-up with primary doctors.    Extrahepatic cholangiocarcinoma, diagnosed 12/2018.  Followed by Dr. Burr Medico who saw patient in the hospital and agreed with plan   Prior history of cholecystitis.  Previous percutaneous gallbladder drainage tube placed by IR complicated by bile leakage in the past, last removed on 9/8.  Right upper quadrant ultrasound in hospital shows no recurrent cholecystitis with persistent gallbladder sludge     AKI, resolved.  Baseline creatinine 1-1.2.  Elevated 1.5 to  admission in setting of dehydration related to poor oral intake and persistent nausea/vomiting.  Improved with IV fluids.    On discharge creatinine 1.07.  Encouraged to continue oral intake on discharge   Hyperkalemia.  Resolved (peak of 5.8) status post Kayexalate on admission, 4.7 on discharge.   Hyponatremia,  improved.  Nadir of 130 (corrected in setting of hyperglycemia 134), improved with correction of hyperglycemia.  Again dropped to 127 on 9/19.  Received IV fluids over additional 24 hours as suspected recurrence of low sodium related to low volume status in setting of previous nausea/vomiting diminished oral intake.  And on discharge sodium found to be 132.  Advise repeat BMP on hospital follow-up.  Patient maintaining normal intake on oral diet.     Type II diabetes, A1c 7.5 (05/2019).    Initially on reduced dose of home NPH due to diminished oral intake on admission, was able to tolerate home regimen and continue NPH 20 units at bedtime on discharge as well as home metformin   Hypertension, stable.  Continue home amlodipine, atenolol   Hyperlipidemia, stable.  Continue home atorvastatin   Chronic normocytic anemia, stable.  Likely anemia of chronic disease related to malignancy.    Hemoglobin remained stable at baseline  9-10.      Consultations:  Oncology  Procedures/Studies: None Subjective: Did well with breakfast this morning Ready to go home Feels much better today Discharge Exam: Vitals:   06/19/19 1416 06/19/19 2224  BP: 116/69 125/73  Pulse: 84 81  Resp: 16 18  Temp: 99.3 F (37.4 C) 98.1 F (36.7 C)  SpO2: 91% 90%   Vitals:   06/18/19 2025 06/19/19 0452 06/19/19 1416 06/19/19 2224  BP: 128/68 138/76  116/69 125/73  Pulse: 76 89 84 81  Resp: _0 Temp: 99.6 F (37.6 C) 99.7 F (37.6 C) 99.3 F (37.4 C) 98.1 F (36.7 C)  TempSrc: Oral Oral Oral Oral  SpO2: 91% 94% 91% 90%  Weight:      Height:        Awake Alert, Oriented X 3,  No new F.N deficits, Normal affect [place].AT Symmetrical Chest wall movement, Good air movement bilaterally, CTAB RRR,No Gallops,Rubs or new Murmurs,  +ve B.Sounds, Abd Soft, No tenderness, dressing in place in right upper quadrant at site of previous cholecystostomy tube, clean dry and intact). No Cyanosis, Clubbing or edema, No new Rash or bruise Right-sided port site clean dry and intact with no surrounding erythema Discharge Diagnoses:  Principal Problem:   Hyperkalemia Active Problems:   DM2 (diabetes mellitus, type 2) (HCC)   Hyperlipidemia with target low density lipoprotein (LDL) cholesterol less than 70 mg/dL   Essential hypertension   Dyslipidemia   Cholangiocarcinoma ([place])   Port-A-Cath in place   Anemia   Abdominal pain   AKI (acute kidney injury) ([place])   Hyperglycemia   Hyponatremia    Discharge Instructions  Discharge Instructions    Diet - low sodium heart healthy   Complete by: As directed    Increase activity slowly   Complete by: As directed      Allergies as of 06/20/2019      Reactions   Erythromycin    Severe Abdominal pain      Medication List    TAKE these medications   Accu-Chek Nano SmartView w/Device Kit 1 kit by Does not apply route 2 (two) times daily. What changed:   when to take this  additional instructions   acetaminophen 500 MG tablet Commonly known as: TYLENOL Take 1,000 mg by mouth 2 (two) times daily as needed for moderate pain or headache.   amLODipine 5 MG tablet Commonly known as: NORVASC Take 5 mg by mouth daily.   atenolol 25 MG tablet Commonly known as: TENORMIN Take 25 mg by mouth daily. Once a day   atorvastatin 10 MG tablet Commonly known as: LIPITOR Take 10 mg by mouth daily.   glucose blood test strip Test 3 times a day. What changed:   how much to take  how to take this  when to take this  additional instructions   insulin NPH Human 100 UNIT/ML injection Commonly known as: NOVOLIN N Inject 20  Units into the skin at bedtime.   insulin regular 100 units/mL injection Commonly known as: NOVOLIN R Inject 20 Units into the skin 3 (three) times daily before meals.   lidocaine-prilocaine cream Commonly known as: EMLA Apply to affected area once   lisinopril 20 MG tablet Commonly known as: ZESTRIL Take 20 mg by mouth daily.   magnesium oxide 400 MG tablet Commonly known as: MAG-OX TAKE 1 TABLET BY MOUTH EVERY DAY   metFORMIN 1000 MG tablet Commonly known as: GLUCOPHAGE Take 2,000 mg by mouth every evening.   ondansetron 8 MG tablet Commonly known as: ZOFRAN TAKE 1 TABLET BY MOUTH 2 TIMES DAY AS NEEDED. START ON 3RD DAY AFTER CHEMOTHERAPY What changed: See the new instructions.   oxycodone 5 MG capsule Commonly known as: OXY-IR Take 1 capsule (5 mg total) by mouth every 6 (six) hours as needed for pain.   prochlorperazine 10 MG tablet Commonly known as: COMPAZINE TAKE 1 TABLET (10 MG TOTAL) BY MOUTH EVERY 6 (SIX) HOURS AS  NEEDED (NAUSEA OR VOMITING). What changed: reasons to take this   zolpidem 5 MG tablet Commonly known as: AMBIEN Take 1-2 tablets (5-10 mg total) by mouth at bedtime as needed for sleep.       Allergies  Allergen Reactions  . Erythromycin     Severe Abdominal pain        The results of significant diagnostics from this hospitalization (including imaging, microbiology, ancillary and laboratory) are listed below for reference.     Microbiology: Recent Results (from the past 240 hour(s))  SARS Coronavirus 2 [facility] order, Performed in [facility] hospital lab) Nasopharyngeal Nasopharyngeal Swab     Status: None   Collection Time: 06/17/19 10:53 AM   Specimen: Nasopharyngeal Swab  Result Value Ref Range Status   SARS Coronavirus 2 NEGATIVE NEGATIVE Final    Comment: (NOTE) If result is NEGATIVE SARS-CoV-2 target nucleic acids are NOT DETECTED. The SARS-CoV-2 RNA is generally detectable in upper and lower  respiratory specimens  during the acute phase of infection. The lowest  concentration of SARS-CoV-2 viral copies this assay can detect is 250  copies / mL. A negative result does not preclude SARS-CoV-2 infection  and should not be used as the sole basis for treatment or other  patient management decisions.  A negative result may occur with  improper specimen collection / handling, submission of specimen other  than nasopharyngeal swab, presence of viral mutation(s) within the  areas targeted by this assay, and inadequate number of viral copies  (<250 copies / mL). A negative result must be combined with clinical  observations, patient history, and epidemiological information. If result is POSITIVE SARS-CoV-2 target nucleic acids are DETECTED. The SARS-CoV-2 RNA is generally detectable in upper and lower  respiratory specimens dur ing the acute phase of infection.  Positive  results are indicative of active infection with SARS-CoV-2.  Clinical  correlation with patient history and other diagnostic information is  necessary to determine patient infection status.  Positive results do  not rule out bacterial infection or co-infection with other viruses. If result is PRESUMPTIVE POSTIVE SARS-CoV-2 nucleic acids MAY BE PRESENT.   A presumptive positive result was obtained on the submitted specimen  and confirmed on repeat testing.  While 2019 novel coronavirus  (SARS-CoV-2) nucleic acids may be present in the submitted sample  additional confirmatory testing may be necessary for epidemiological  and / or clinical management purposes  to differentiate between  SARS-CoV-2 and other Sarbecovirus currently known to infect humans.  If clinically indicated additional testing with an alternate test  methodology [phone]) is advised. The SARS-CoV-2 RNA is generally  detectable in upper and lower respiratory sp ecimens during the acute  phase of infection. The expected result is Negative. Fact Sheet for Patients:   StrictlyIdeas.no Fact Sheet for Healthcare Providers: BankingDealers.co.za This test is not yet approved or cleared by the [place] FDA and has been authorized for detection and/or diagnosis of SARS-CoV-2 by FDA under an Emergency Use Authorization (EUA).  This EUA will remain in effect (meaning this test can be used) for the duration of the COVID-19 declaration under Section 564(b)(1) of the Act, 21 U.S.C. section 360bbb-3(b)(1), unless the authorization is terminated or revoked sooner. Performed at [facility], [place] [address], [place], [place] [address]      Labs: BNP (last 3 results) No results for input(s): BNP in the last 8760 hours. Basic Metabolic Panel: Recent Labs  Lab 06/17/19 0758 06/17/19 1700 06/18/19 0456 06/19/19 1058 06/20/19  0500  NA 130* 130* 133* 127* 132*  K 5.8* 4.6 4.2 4.4 4.7  CL 95* 97* 100 93* 98  CO2 _0 GLUCOSE 358* 185* 131* 346* 263*  BUN 28* 26* 24* 20 17  CREATININE 1.52* 1.20 1.09 1.09 1.07  CALCIUM 9.1 8.2* 8.5* 8.5* 8.6*  MG 2.0  --   --   --   --    Liver Function Tests: Recent Labs  Lab 06/17/19 0758 06/17/19 1700 06/18/19 0456  AST 33 31 29  ALT 32 31 30  ALKPHOS 59 45 44  BILITOT 0.6 0.6 0.5  PROT 6.9 6.3* 6.3*  ALBUMIN 2.7* 2.5* 2.5*   Recent Labs  Lab 06/18/19 0456  LIPASE 13   No results for input(s): AMMONIA in the last 168 hours. CBC: Recent Labs  Lab 06/17/19 0758 06/17/19 1618 06/18/19 0456 06/19/19 1058  WBC 11.0* 9.4 7.5 8.7  NEUTROABS 7.7 5.0  --   --   HGB 9.9* 8.8* 8.6* 9.7*  HCT 30.5* 28.2* 27.0* 30.4*  MCV 91.9 93.4 93.1 92.7  PLT 383 386 340 457*   Cardiac Enzymes: No results for input(s): CKTOTAL, CKMB, CKMBINDEX, TROPONINI in the last 168 hours. BNP: Invalid input(s): POCBNP CBG: Recent Labs  Lab 06/19/19 0742 06/19/19 1154 06/19/19 1625 06/19/19 2222 06/20/19 0726  GLUCAP 212* 326* 205* 254* 267*    D-Dimer No results for input(s): DDIMER in the last 72 hours. Hgb A1c No results for input(s): HGBA1C in the last 72 hours. Lipid Profile No results for input(s): CHOL, HDL, LDLCALC, TRIG, CHOLHDL, LDLDIRECT in the last 72 hours. Thyroid function studies No results for input(s): TSH, T4TOTAL, T3FREE, THYROIDAB in the last 72 hours.  Invalid input(s): FREET3 Anemia work up No results for input(s): VITAMINB12, FOLATE, FERRITIN, TIBC, IRON, RETICCTPCT in the last 72 hours. Urinalysis No results found for: COLORURINE, APPEARANCEUR, LABSPEC, [place], GLUCOSEU, [place], [place], KETONESUR, PROTEINUR, UROBILINOGEN, NITRITE, LEUKOCYTESUR Sepsis Labs Invalid input(s): PROCALCITONIN,  WBC,  [place] Microbiology Recent Results (from the past 240 hour(s))  SARS Coronavirus 2 [facility] order, Performed in [facility] hospital lab) Nasopharyngeal Nasopharyngeal Swab     Status: None   Collection Time: 06/17/19 10:53 AM   Specimen: Nasopharyngeal Swab  Result Value Ref Range Status   SARS Coronavirus 2 NEGATIVE NEGATIVE Final    Comment: (NOTE) If result is NEGATIVE SARS-CoV-2 target nucleic acids are NOT DETECTED. The SARS-CoV-2 RNA is generally detectable in upper and lower  respiratory specimens during the acute phase of infection. The lowest  concentration of SARS-CoV-2 viral copies this assay can detect is 250  copies / mL. A negative result does not preclude SARS-CoV-2 infection  and should not be used as the sole basis for treatment or other  patient management decisions.  A negative result may occur with  improper specimen collection / handling, submission of specimen other  than nasopharyngeal swab, presence of viral mutation(s) within the  areas targeted by this assay, and inadequate number of viral copies  (<250 copies / mL). A negative result must be combined with clinical  observations, patient history, and epidemiological information. If result is POSITIVE SARS-CoV-2  target nucleic acids are DETECTED. The SARS-CoV-2 RNA is generally detectable in upper and lower  respiratory specimens dur ing the acute phase of infection.  Positive  results are indicative of active infection with SARS-CoV-2.  Clinical  correlation with patient history and other diagnostic information is  necessary to determine patient infection status.  Positive results  do  not rule out bacterial infection or co-infection with other viruses. If result is PRESUMPTIVE POSTIVE SARS-CoV-2 nucleic acids MAY BE PRESENT.   A presumptive positive result was obtained on the submitted specimen  and confirmed on repeat testing.  While 2019 novel coronavirus  (SARS-CoV-2) nucleic acids may be present in the submitted sample  additional confirmatory testing may be necessary for epidemiological  and / or clinical management purposes  to differentiate between  SARS-CoV-2 and other Sarbecovirus currently known to infect humans.  If clinically indicated additional testing with an alternate test  methodology [phone]) is advised. The SARS-CoV-2 RNA is generally  detectable in upper and lower respiratory sp ecimens during the acute  phase of infection. The expected result is Negative. Fact Sheet for Patients:  StrictlyIdeas.no Fact Sheet for Healthcare Providers: BankingDealers.co.za This test is not yet approved or cleared by the [place] FDA and has been authorized for detection and/or diagnosis of SARS-CoV-2 by FDA under an Emergency Use Authorization (EUA).  This EUA will remain in effect (meaning this test can be used) for the duration of the COVID-19 declaration under Section 564(b)(1) of the Act, 21 U.S.C. section 360bbb-3(b)(1), unless the authorization is terminated or revoked sooner. Performed at [facility], [place] [address], [place], [place] [address]      Time coordinating discharge: Over 30  minutes  SIGNED:   Desiree Hane, MD  [facility] 06/20/2019, 12:17 PM Pager   If 7PM-7AM, please contact night-coverage [email or web address] Password [email or web address]\

## 2019-06-20 NOTE — Progress Notes (Signed)
Patient discharged to home, discharge instructions reviewed with patient who verbalized understanding. No new Medications.

## 2019-06-21 NOTE — Progress Notes (Signed)
[place]   [phone] [phone]   Clinic Follow up Note   Patient Care Team: [name], DO as PCP - General (Family Medicine)  Date of Service:  06/24/2019  CHIEF COMPLAINT: F/u of ExtrahepaticCholangiocarcinoma  SUMMARY OF ONCOLOGIC HISTORY: Oncology History  Cholangiocarcinoma ([place])  01/14/2019 Imaging   US Abdomen 01/14/19  IMPRESSION: There is significant intrahepatic and extrahepatic biliary dilatation present, concerning for distal common bile duct obstruction. Further evaluation with CT scan or MRCP is recommended. Correlation with liver function tests is recommended as well. These results will be called to the ordering clinician or representative by the Radiologist Assistant, and communication documented in the PACS or zVision Dashboard.   Probable large amount of sludge seen within gallbladder lumen with mild gallbladder wall thickening. However, no cholelithiasis, pericholecystic fluid or sonographic Murphy's sign is noted.   4.2 cm septated cyst seen in upper pole of right kidney consistent with Bosniak type 2 lesion. Follow-up ultrasound in 1 year is recommended to ensure stability.   01/20/2019 Imaging   MRI abdomen 01/20/19  IMPRESSION: 1. Findings are highly concerning for central tumor in the biliary tract at the confluence of the common hepatic duct, cystic duct and common bile ducts. Further clinical evaluation for potential cholangiocarcinoma is strongly recommended. 2. Mild ductal dilatation of the main pancreatic duct throughout the distal body and tail of the pancreas where there is also some associated side branch ectasia. This may suggest a pancreatic ductal stricture. No obstructing pancreatic neoplasm identified. 3. Aortic atherosclerosis.   01/21/2019 Initial Biopsy   Diagnosis 01/21/19  BILE DUCT BRUSHING (SPECIMEN 1 OF 1 COLLECTED 01/21/2019) ADENOCARCINOMA.   01/21/2019 Procedure   ERCP By Dr hung  01/21/19 IMRPESSION - The major papilla appeared normal. - A single localized biliary stricture was found in the middle third of the main bile duct. - The entire main bile duct and upper third of the main bile duct were dilated, secondary to a stricture. - A biliary sphincterotomy was performed. - Cells for cytology obtained in the middle third of the main bile duct. - One plastic stent was placed into the common bile duct.  EUS by Dr hung 01/21/19  IMPRESSION - There was dilation in the middle third of the main bile duct and in the upper third of the main bile duct which measured up to 15 mm. - There was a suggestion of a stricture in the middle third of the main bile duct. - No specimens collected.   02/03/2019 Imaging   CT CAP at [facility] 02/03/19  Impression:  1. There is mild intrahepatic and extrahepatic biliary ductal dilation and diffuse common bile duct wall thickening with stent in place. There is abnormal soft tissue measuring approximately 1.6 cm between the common hepatic artery, portal vein and common bile duct, worrisome for the known cholangiocarcinoma.  2. Periportal lymphadenopathy which may represent nodal metastasis.  4. Marked pancreatic atrophy and duct dilation involving the distal body and tail the pancreas where there is a coarse calcification. These findings are favored to represent stenosis from intraductal stones though an underlying stricture cannot be completely excluded.  5. Atypical symmetric prominent fat stranding of the bilateral lower abdominal wall. Correlate with surgical history or history of history of trauma.   03/08/2019 Initial Diagnosis   Cholangiocarcinoma ([place])   03/12/2019 -  Chemotherapy   gemcitabine and cisplatin on days 1 and 8 every 21 days starting 03/12/19. Abraxane added with cycle 2. Added GCSF [name]) to  day 9 starting cycle 2.    03/19/2019 Cancer Staging   Staging form: Perihilar Bile Ducts, AJCC 8th Edition - Clinical:  Stage Unknown (cTX, cN0, cM0) - Signed by [name], MD on 03/19/2019   05/24/2019 Imaging   [facility]  1.  Interval placement of covered metallic biliary stent. No progressive dilation of the biliary tree and resulting resolution of the prior main pancreatic duct dilation. 2.  Evidence of some subtle vascular contour deformity involving the hepatic artery, suspected to be from tumoral contact and/or posttreatment changes. 3.  New small right pleural effusion with overlying mild airspace disease. Secondary findings which may indicate a component of aspiration. 4.  No convincing evidence of new disseminated metastatic disease. 5.  Additional findings as discussed above.      CURRENT THERAPY:  gemcitabine and cisplatin on days 1 and 8 every 21 daysstarting 03/12/19. Abraxane added with cycle 2.AddedGCSF ([name])to day 9 startingcycle 2.  INTERVAL HISTORY:  Christopher Welch is here for a follow up and treatment. He presents to the clinic alone.   Had thoracentesis with 2.4L fluids removed yesterday, dyspnea did not improve  Cough since last night, no fever, (+) chills  Still very fatigued, no appetite, but eating some, BM normal, right shoulder pain improved   The review of system otherwise negative  MEDICAL HISTORY:  Past Medical History:  Diagnosis Date  . Diabetes mellitus type I, controlled ([place])   . Dyslipidemia   . ED (erectile dysfunction)   . Essential hypertension   . Gilbert's disease   . Hepatitis B carrier [facility])     SURGICAL HISTORY: Past Surgical History:  Procedure Laterality Date  . APPENDECTOMY  1964  . BILIARY BRUSHING  01/21/2019   Procedure: BILIARY BRUSHING;  Surgeon: Carol Ada, MD;  Location: [facility] ENDOSCOPY;  Service: Endoscopy;;  . BILIARY STENT PLACEMENT N/A 01/21/2019   Procedure: BILIARY STENT PLACEMENT;  Surgeon: Carol Ada, MD;  Location: [facility];  Service: Endoscopy;  Laterality: N/A;  . ENDOSCOPIC RETROGRADE  CHOLANGIOPANCREATOGRAPHY (ERCP) WITH PROPOFOL N/A 01/21/2019   Procedure: ENDOSCOPIC RETROGRADE CHOLANGIOPANCREATOGRAPHY (ERCP) WITH PROPOFOL;  Surgeon: Carol Ada, MD;  Location: [facility];  Service: Endoscopy;  Laterality: N/A;  . ESOPHAGOGASTRODUODENOSCOPY (EGD) WITH PROPOFOL N/A 01/21/2019   Procedure: ESOPHAGOGASTRODUODENOSCOPY (EGD) WITH PROPOFOL;  Surgeon: Carol Ada, MD;  Location: [facility];  Service: Endoscopy;  Laterality: N/A;  . FEMORAL HERNIA REPAIR    . IR CHOLANGIOGRAM EXISTING TUBE  05/13/2019  . IR CHOLANGIOGRAM EXISTING TUBE  06/08/2019  . IR EXCHANGE BILIARY DRAIN  05/18/2019  . IR EXCHANGE BILIARY DRAIN  06/08/2019  . IR IMAGING GUIDED PORT INSERTION  03/08/2019  . IR PERC CHOLECYSTOSTOMY  05/04/2019  . LEFT HEART CATH AND CORONARY ANGIOGRAPHY N/A 05/19/2017   Procedure: LEFT HEART CATH AND CORONARY ANGIOGRAPHY;  Surgeon: Leonie Man, MD;  Location: [place] CV LAB;  Service: Cardiovascular;  Laterality: N/A;  . SPHINCTEROTOMY  01/21/2019   Procedure: SPHINCTEROTOMY;  Surgeon: Carol Ada, MD;  Location: [facility];  Service: Endoscopy;;  . UPPER ESOPHAGEAL ENDOSCOPIC ULTRASOUND (EUS) N/A 01/21/2019   Procedure: UPPER ESOPHAGEAL ENDOSCOPIC ULTRASOUND (EUS);  Surgeon: Carol Ada, MD;  Location: [facility];  Service: Endoscopy;  Laterality: N/A;    I have reviewed the social history and family history with the patient and they are unchanged from previous note.  ALLERGIES:  is allergic to erythromycin.  MEDICATIONS:  Current Outpatient Medications  Medication Sig Dispense Refill  . acetaminophen (TYLENOL)  500 MG tablet Take 1,000 mg by mouth 2 (two) times daily as needed for moderate pain or headache.    Marland Kitchen amLODipine (NORVASC) 5 MG tablet Take 5 mg by mouth daily.     Marland Kitchen atenolol (TENORMIN) 25 MG tablet Take 25 mg by mouth daily. Once a day     . atorvastatin (LIPITOR) 10 MG tablet Take 10 mg by mouth daily.    . Blood Glucose Monitoring Suppl (ACCU-CHEK  NANO SMARTVIEW) W/DEVICE KIT 1 kit by Does not apply route 2 (two) times daily. (Patient taking differently: 1 kit by Does not apply route See admin instructions. Test blood sugars 12x's daily) 1 kit 0  . glucose blood test strip Test 3 times a day. (Patient taking differently: 1 each by Other route See admin instructions. Test 12 times a day.) 300 each Prn  . insulin NPH Human (NOVOLIN N) 100 UNIT/ML injection Inject 20 Units into the skin at bedtime.     . insulin regular (NOVOLIN R) 100 units/mL injection Inject 20 Units into the skin 3 (three) times daily before meals.     . lidocaine-prilocaine (EMLA) cream Apply to affected area once 30 g 3  . lisinopril (ZESTRIL) 20 MG tablet Take 20 mg by mouth daily.     . magnesium oxide (MAG-OX) 400 MG tablet TAKE 1 TABLET BY MOUTH EVERY DAY (Patient taking differently: Take 400 mg by mouth daily. ) 90 tablet 0  . metFORMIN (GLUCOPHAGE) 1000 MG tablet Take 2,000 mg by mouth every evening.     . ondansetron (ZOFRAN) 8 MG tablet TAKE 1 TABLET BY MOUTH 2 TIMES DAY AS NEEDED. START ON 3RD DAY AFTER CHEMOTHERAPY (Patient taking differently: Take 8 mg by mouth 2 (two) times daily as needed. Start on 3rd day after chemotherapy) 30 tablet 1  . oxycodone (OXY-IR) 5 MG capsule Take 1 capsule (5 mg total) by mouth every 6 (six) hours as needed for pain. 60 capsule 0  . prochlorperazine (COMPAZINE) 10 MG tablet TAKE 1 TABLET (10 MG TOTAL) BY MOUTH EVERY 6 (SIX) HOURS AS NEEDED (NAUSEA OR VOMITING). (Patient taking differently: Take 10 mg by mouth every 6 (six) hours as needed for nausea or vomiting (Nausea or vomiting). ) 30 tablet 1  . zolpidem (AMBIEN) 5 MG tablet Take 1-2 tablets (5-10 mg total) by mouth at bedtime as needed for sleep. 60 tablet 0  . amoxicillin-clavulanate (AUGMENTIN) 875-125 MG tablet Take 1 tablet by mouth 2 (two) times daily. 14 tablet 0   Current Facility-Administered Medications  Medication Dose Route Frequency Provider Last Rate Last Dose  .  0.9 %  sodium chloride infusion   Intravenous Continuous [name], MD       Facility-Administered Medications Ordered in Other Visits  Medication Dose Route Frequency Provider Last Rate Last Dose  . 0.9 %  sodium chloride infusion   Intravenous Once [name], MD 500 mL/hr at 06/24/19 0906      PHYSICAL EXAMINATION: ECOG PERFORMANCE STATUS: 2  Vitals:   06/24/19 0822  BP: 104/64  Pulse: 87  Resp: 18  Temp: 97.8 F (36.6 C)  SpO2: 96%   Filed Weights   06/24/19 0822  Weight: 142 lb 8 oz (64.6 kg)    GENERAL:alert, no distress and comfortable SKIN: skin color, texture, turgor are normal, no rashes or significant lesions EYES: normal, Conjunctiva are pink and non-injected, sclera clear NECK: supple, thyroid normal size, non-tender, without nodularity LYMPH:  no palpable lymphadenopathy in the cervical, axillary  LUNGS:  clear to auscultation and percussion with normal breathing effort, scattered rhonchi on bilateral lung base  HEART: regular rate & rhythm and no murmurs and no lower extremity edema ABDOMEN:abdomen soft, non-tender and normal bowel sounds Musculoskeletal:no cyanosis of digits and no clubbing  NEURO: alert & oriented x 3 with fluent speech, no focal motor/sensory deficits  LABORATORY DATA:  I have reviewed the data as listed CBC Latest Ref Rng & Units 06/24/2019 06/22/2019 06/19/2019  WBC 4.0 - 10.5 K/uL 11.2(H) 10.4 8.7  Hemoglobin 13.0 - 17.0 g/dL 10.5(L) 9.9(L) 9.7(L)  Hematocrit 39.0 - 52.0 % 32.9(L) 30.4(L) 30.4(L)  Platelets 150 - 400 K/uL 636(H) 646(H) 457(H)     CMP Latest Ref Rng & Units 06/24/2019 06/22/2019 06/20/2019  Glucose 70 - 99 mg/dL 240(H) 186(H) 263(H)  BUN 8 - 23 mg/dL 19 16 17   Creatinine 0.61 - 1.24 mg/dL 1.33(H) 1.25(H) 1.07  Sodium 135 - 145 mmol/L 135 135 132(L)  Potassium 3.5 - 5.1 mmol/L 4.7 4.0 4.7  Chloride 98 - 111 mmol/L 100 99 98  CO2 22 - 32 mmol/L 26 26 26   Calcium 8.9 - 10.3 mg/dL 8.9 9.3 8.6(L)  Total Protein 6.5 - 8.1  g/dL 6.4(L) 7.0 -  Total Bilirubin 0.3 - 1.2 mg/dL 0.3 0.2(L) -  Alkaline Phos 38 - 126 U/L 60 58 -  AST 15 - 41 U/L 14(L) 25 -  ALT 0 - 44 U/L 18 29 -      RADIOGRAPHIC STUDIES: I have personally reviewed the radiological images as listed and agreed with the findings in the report. Dg Chest 1 View  Result Date: 06/23/2019 CLINICAL DATA:  Followup right thoracentesis EXAM: CHEST  1 VIEW COMPARISON:  06/22/2019 FINDINGS: Right power port is unchanged with the tip at the proximal right atrium. Status post thoracentesis on the right with marked reduction of right pleural fluid. No pneumothorax. Much better aeration of the right lung. Left chest remains clear. IMPRESSION: Status post thoracentesis on the right with much less pleural fluid and better aeration of the right lung. No pneumothorax. Electronically Signed   By: Nelson Chimes M.D.   On: 06/23/2019 14:31   Dg Chest 2 View  Result Date: 06/22/2019 CLINICAL DATA:  labor breathing while ambulating. slightly congested, but denied any chest pain, fever, or night sweats. EXAM: CHEST - 2 VIEW COMPARISON:  CT abdomen 06/18/2019, chest x-ray 03/31/2015 FINDINGS: Large right pleural effusion with consolidation/atelectasis in most of the right lung excluding the apex. Left lung clear. Right IJ power injectable port catheter extends to the cavoatrial junction. Heart size appears grossly normal. There is no convincing mediastinal shift. No pneumothorax. Endoscopic metallic stent appears stable in position. Regional bones unremarkable. IMPRESSION: 1. Large right pleural effusion Electronically Signed   By: Lucrezia Europe M.D.   On: 06/22/2019 12:30   US Thoracentesis Asp Pleural Space W/img Guide  Result Date: 06/23/2019 INDICATION: Patient with history of extrahepatic cholangiocarcinoma, dyspnea, right pleural effusion. Request made for diagnostic and therapeutic right thoracentesis. EXAM: ULTRASOUND GUIDED DIAGNOSTIC AND THERAPEUTIC RIGHT THORACENTESIS  MEDICATIONS: None COMPLICATIONS: None immediate. PROCEDURE: An ultrasound guided thoracentesis was thoroughly discussed with the patient and questions answered. The benefits, risks, alternatives and complications were also discussed. The patient understands and wishes to proceed with the procedure. Written consent was obtained. Ultrasound was performed to localize and mark an adequate pocket of fluid in the right chest. The area was then prepped and draped in the normal sterile fashion. 1% Lidocaine was used for local anesthesia.  Under ultrasound guidance a 6 Fr Safe-T-Centesis catheter was introduced. Thoracentesis was performed. The catheter was removed and a dressing applied. FINDINGS: A total of approximately 2.1 liters of yellow fluid was removed. Samples were sent to the laboratory as requested by the clinical team. Due to patient coughing only the above amount of fluid was removed today. IMPRESSION: Successful ultrasound guided diagnostic and therapeutic right thoracentesis yielding 2.1 liters of pleural fluid. Read by: Rowe Robert, PA-C Electronically Signed   By: Jacqulynn Cadet M.D.   On: 06/23/2019 14:23     ASSESSMENT & PLAN:  Christopher Welch is a 73 y.o. male with   1. Extrahepatic cholangiocarcinoma, cTxN0M0 -He wasdiagnosedin 12/2018. His work up revealed malignant stricture and CBD obstruction required stenting. Brushing revealed adenocarcinoma. His extrahepatic cholangiocarcinoma was not resectable due to vascular invasion(surgeon - Dr. Zenia Resides at [facility]) -He has started first line chemogemcitabine and cisplatin on days 1 and 8 every 21 dayson 03/12/19. Abraxane added with cycle 2.AddedGCSF ([name])to day 9 startingcycle 2. -Heinitially tolerated chemotherapy well, but he has developed new symptoms, decreased performance status negative, chemo has been descalated and held -[facility] CT Cap on [facility]24/[facility] shows stable disease, Dr. Carlis Abbott recommended continue chemo  -He clincially  improved afterpercutaneous gallbladder drainage tubewas placedforacute cholecystitisand I restarted chemo last week.  Due to the leakage of the draining tube, it was removed on 9/8 -He was recently hospitalized for persistent nausea, vomiting, dehydration, improved now -He has developed new right pleural effusion, was drained yesterday, cytology still pending -Given his overall not feeling well, possible bronchitis, I will hold therapy today, and to evaluate him  2. History of acute cholecystitis -He washospitalizedon 05/03/19.  -He had a percutaneous gallbladder drainage tube placed by IR8/5/20. It was removed on 9/8 due to leakage  -his current abdominal pain is probably related to the procedure, I encouraged him to use pain medication as needed -No clinical high concern for acute cholecystitis we will continue to monitoring clinically   3.  New right-sided pleural effusion, cough with chills -He underwent right thoracentesis yesterday with 2.1 is removed, cytology still pending -Chest x-ray yesterday did not show evidence of pneumonia.  However given he is productive cough, chills, adequate white count, I will call in Augmentin 875 mg twice daily for 7 days for presumed acute bronchitis -If cytology negative, I will get an echocardiogram to rule out CHF  4. Weight lossand low appetite -secondary to malignancyand chemo -he presented with 20 lbs weight loss  -We reviewed nutrition support withGlucerna -Continue to f/u with dietician -His appetitehas dropped significantly with recent cholecystitis. He was able to maintain weight. -I encourage him to increase nutritional supplement (carnation breakfast and Boost) to 3-5 bottles a day.  5. DM -managed by PCP Dr. Theda Sers -he has had DM for 20 years, he is compliant with regimen and knows how to adjust BG -on insulin, he knows how to adjust dose based on his BG level, but overall not well controlled, I encourage him to f/u  with Dr. Theda Sers -Due to elevated BG secondary to chemo pre-meds, he has been needing to increase his insulin but working on controlling it.  6. CAD, HL, HTN -followed by cardiologist Dr. Ellyn Hack -on amlodipine, atenolol, lisinopril, and statin -controlled and stable    7. AKI -Cr 1.33 today, slightly worse than last week -will give IVF today and I encouraged him to drink more fluids at home   PLAN: -hold chemo today and give NS 1L over  2 hours  -will call in Augmentin 7 days for his acute bronchitis -return in one week to re-evaluate for chemo    No problem-specific Assessment & Plan notes found for this encounter.   No orders of the defined types were placed in this encounter.  All questions were answered. The patient knows to call the clinic with any problems, questions or concerns. No barriers to learning was detected. I spent 20 minutes counseling the patient face to face. The total time spent in the appointment was 25 minutes and more than 50% was on counseling and review of test results     [name], MD 06/24/2019   I, Joslyn Devon, am acting as scribe for [name], MD.   I have reviewed the above documentation for accuracy and completeness, and I agree with the above.

## 2019-06-22 ENCOUNTER — Inpatient Hospital Stay: Payer: Medicare Other

## 2019-06-22 ENCOUNTER — Inpatient Hospital Stay (HOSPITAL_BASED_OUTPATIENT_CLINIC_OR_DEPARTMENT_OTHER): Payer: Medicare Other | Admitting: Medical

## 2019-06-22 ENCOUNTER — Telehealth: Payer: Self-pay

## 2019-06-22 ENCOUNTER — Other Ambulatory Visit: Payer: Self-pay

## 2019-06-22 ENCOUNTER — Ambulatory Visit (HOSPITAL_COMMUNITY)
Admission: RE | Admit: 2019-06-22 | Discharge: 2019-06-22 | Disposition: A | Discharge status: Home / Self Care | Payer: Medicare Other | Source: Ambulatory Visit | Attending: Medical | Admitting: Medical

## 2019-06-22 ENCOUNTER — Other Ambulatory Visit: Payer: Self-pay | Admitting: Emergency Medicine

## 2019-06-22 VITALS — BP 124/76 | HR 82 | Temp 97.8°F | Resp 18 | Ht 67.0 in | Wt 148.1 lb

## 2019-06-22 DIAGNOSIS — J90 Pleural effusion, not elsewhere classified: Secondary | ICD-10-CM | POA: Diagnosis not present

## 2019-06-22 DIAGNOSIS — M25511 Pain in right shoulder: Secondary | ICD-10-CM | POA: Diagnosis not present

## 2019-06-22 DIAGNOSIS — R0602 Shortness of breath: Secondary | ICD-10-CM

## 2019-06-22 DIAGNOSIS — G4700 Insomnia, unspecified: Secondary | ICD-10-CM | POA: Diagnosis not present

## 2019-06-22 DIAGNOSIS — R0600 Dyspnea, unspecified: Secondary | ICD-10-CM | POA: Diagnosis not present

## 2019-06-22 DIAGNOSIS — Z95828 Presence of other vascular implants and grafts: Secondary | ICD-10-CM | POA: Diagnosis not present

## 2019-06-22 DIAGNOSIS — C221 Intrahepatic bile duct carcinoma: Secondary | ICD-10-CM | POA: Diagnosis not present

## 2019-06-22 DIAGNOSIS — E860 Dehydration: Secondary | ICD-10-CM | POA: Diagnosis not present

## 2019-06-22 DIAGNOSIS — R5383 Other fatigue: Secondary | ICD-10-CM | POA: Diagnosis not present

## 2019-06-22 DIAGNOSIS — R064 Hyperventilation: Secondary | ICD-10-CM | POA: Diagnosis not present

## 2019-06-22 DIAGNOSIS — Z79899 Other long term (current) drug therapy: Secondary | ICD-10-CM | POA: Diagnosis not present

## 2019-06-22 DIAGNOSIS — E785 Hyperlipidemia, unspecified: Secondary | ICD-10-CM | POA: Diagnosis not present

## 2019-06-22 DIAGNOSIS — Z794 Long term (current) use of insulin: Secondary | ICD-10-CM | POA: Diagnosis not present

## 2019-06-22 DIAGNOSIS — R05 Cough: Secondary | ICD-10-CM

## 2019-06-22 DIAGNOSIS — Z5111 Encounter for antineoplastic chemotherapy: Secondary | ICD-10-CM | POA: Diagnosis not present

## 2019-06-22 DIAGNOSIS — R059 Cough, unspecified: Secondary | ICD-10-CM

## 2019-06-22 DIAGNOSIS — Z452 Encounter for adjustment and management of vascular access device: Secondary | ICD-10-CM | POA: Diagnosis not present

## 2019-06-22 DIAGNOSIS — R6883 Chills (without fever): Secondary | ICD-10-CM | POA: Diagnosis not present

## 2019-06-22 DIAGNOSIS — I10 Essential (primary) hypertension: Secondary | ICD-10-CM | POA: Diagnosis not present

## 2019-06-22 DIAGNOSIS — B181 Chronic viral hepatitis B without delta-agent: Secondary | ICD-10-CM | POA: Diagnosis not present

## 2019-06-22 DIAGNOSIS — R079 Chest pain, unspecified: Secondary | ICD-10-CM | POA: Diagnosis not present

## 2019-06-22 DIAGNOSIS — C240 Malignant neoplasm of extrahepatic bile duct: Secondary | ICD-10-CM | POA: Diagnosis not present

## 2019-06-22 DIAGNOSIS — N179 Acute kidney failure, unspecified: Secondary | ICD-10-CM | POA: Diagnosis not present

## 2019-06-22 DIAGNOSIS — Z7984 Long term (current) use of oral hypoglycemic drugs: Secondary | ICD-10-CM | POA: Diagnosis not present

## 2019-06-22 DIAGNOSIS — E1065 Type 1 diabetes mellitus with hyperglycemia: Secondary | ICD-10-CM | POA: Diagnosis not present

## 2019-06-22 DIAGNOSIS — R509 Fever, unspecified: Secondary | ICD-10-CM | POA: Diagnosis not present

## 2019-06-22 DIAGNOSIS — Z20828 Contact with and (suspected) exposure to other viral communicable diseases: Secondary | ICD-10-CM | POA: Diagnosis not present

## 2019-06-22 DIAGNOSIS — I2510 Atherosclerotic heart disease of native coronary artery without angina pectoris: Secondary | ICD-10-CM | POA: Diagnosis not present

## 2019-06-22 DIAGNOSIS — R634 Abnormal weight loss: Secondary | ICD-10-CM | POA: Diagnosis not present

## 2019-06-22 LAB — SARS CORONAVIRUS 2 BY RT PCR (HOSPITAL ORDER, PERFORMED IN ~~LOC~~ HOSPITAL LAB): SARS Coronavirus 2: NEGATIVE

## 2019-06-22 LAB — CMP (CANCER CENTER ONLY)
ALT: 29 U/L (ref 0–44)
AST: 25 U/L (ref 15–41)
Albumin: 2.7 g/dL — ABNORMAL LOW (ref 3.5–5.0)
Alkaline Phosphatase: 58 U/L (ref 38–126)
Anion gap: 10 (ref 5–15)
BUN: 16 mg/dL (ref 8–23)
CO2: 26 mmol/L (ref 22–32)
Calcium: 9.3 mg/dL (ref 8.9–10.3)
Chloride: 99 mmol/L (ref 98–111)
Creatinine: 1.25 mg/dL — ABNORMAL HIGH (ref 0.61–1.24)
GFR, Est AFR Am: >60 mL/min (ref >60)
GFR, Estimated: 58 mL/min — ABNORMAL LOW (ref >60)
Glucose, Bld: 186 mg/dL — ABNORMAL HIGH (ref 70–99)
Potassium: 4 mmol/L (ref 3.5–5.1)
Sodium: 135 mmol/L (ref 135–145)
Total Bilirubin: 0.2 mg/dL — ABNORMAL LOW (ref 0.3–1.2)
Total Protein: 7 g/dL (ref 6.5–8.1)

## 2019-06-22 LAB — CBC WITH DIFFERENTIAL (CANCER CENTER ONLY)
Abs Immature Granulocytes: 0.07 10*3/uL (ref 0.00–0.07)
Basophils Absolute: 0 10*3/uL (ref 0.0–0.1)
Basophils Relative: 0 %
Eosinophils Absolute: 0.2 10*3/uL (ref 0.0–0.5)
Eosinophils Relative: 2 %
HCT: 30.4 % — ABNORMAL LOW (ref 39.0–52.0)
Hemoglobin: 9.9 g/dL — ABNORMAL LOW (ref 13.0–17.0)
Immature Granulocytes: 1 %
Lymphocytes Relative: 25 %
Lymphs Abs: 2.6 10*3/uL (ref 0.7–4.0)
MCH: 29.7 pg (ref 26.0–34.0)
MCHC: 32.6 g/dL (ref 30.0–36.0)
MCV: 91.3 fL (ref 80.0–100.0)
Monocytes Absolute: 1.6 10*3/uL — ABNORMAL HIGH (ref 0.1–1.0)
Monocytes Relative: 15 %
Neutro Abs: 6 10*3/uL (ref 1.7–7.7)
Neutrophils Relative %: 57 %
Platelet Count: 646 10*3/uL — ABNORMAL HIGH (ref 150–400)
RBC: 3.33 MIL/uL — ABNORMAL LOW (ref 4.22–5.81)
RDW: 16.3 % — ABNORMAL HIGH (ref 11.5–15.5)
WBC Count: 10.4 10*3/uL (ref 4.0–10.5)
nRBC: 0 % (ref 0.0–0.2)

## 2019-06-22 LAB — SAMPLE TO BLOOD BANK

## 2019-06-22 LAB — MAGNESIUM: Magnesium: 1.9 mg/dL (ref 1.7–2.4)

## 2019-06-22 MED ORDER — SODIUM CHLORIDE 0.9 % IV SOLN
Freq: Once | INTRAVENOUS | Status: AC
  Administered 2019-06-22: 14:00:00 via INTRAVENOUS
  Filled 2019-06-22: qty 250

## 2019-06-22 MED ORDER — HEPARIN SOD (PORK) LOCK FLUSH 100 UNIT/ML IV SOLN
500.0000 [IU] | Freq: Once | INTRAVENOUS | Status: AC | PRN
  Administered 2019-06-22: 500 [IU]
  Filled 2019-06-22: qty 5

## 2019-06-22 MED ORDER — SODIUM CHLORIDE 0.9% FLUSH
10.0000 mL | INTRAVENOUS | Status: DC | PRN
  Administered 2019-06-22: 15:00:00 10 mL
  Filled 2019-06-22: qty 10

## 2019-06-22 NOTE — Patient Instructions (Signed)

## 2019-06-22 NOTE — Telephone Encounter (Signed)
Patient left a voicemail saying that since discharging from the hospital he's experienced more labor breathing while ambulating. Called patient back to get more information and to see if he would be able to come in to be assessed in our [facility]. Patient stated he felt slightly congested, but denied any chest pain, fever, or night sweats. He also stated he was available to come to [facility]. Told patient I would update Christopher Welch and call patient back with his recommendations. Spoke with Christopher Welch who recommended patient come in around 12:00 and have CXR + CBC, CMP, and blood bank hold tube drawn.   Orders placed and high priority scheduling message sent. Called patient back and let him know to go to radiology first for his CXR and then we would draw those labs from his port in [facility], and Christopher Welch would assess him. Patient verbalized understanding and agreement.

## 2019-06-22 NOTE — Progress Notes (Signed)
Pt received 500 ml NS IVF today over 1 hour, tolerated well.  VSS.  Reports feeling better at end of tx.  Ate & drank during infusion without any issues.  Pt aware of date/time of schedule thoracentesis on 06/23/2019.  Covid-19 rapid test performed by PA Cassie/PA Lucianne Lei.  Pt denies any questions or concerns at time of d/c, verbalized understanding to f/u as needed before appt tomorrow.

## 2019-06-22 NOTE — Progress Notes (Signed)
Symptoms Management Clinic Progress Note   ADWIN CARRAHER [id number] Jun 04, 1946 73 y.o.  Christopher Welch is managed by Dr. [name]  Actively treated with chemotherapy/immunotherapy/hormonal therapy: yes  Current therapy: Cisplatin and gemcitabine  Last treated: 06/03/2019 (cycle 5, day 1)  Next scheduled appointment with provider: 06/24/2019.  Assessment: Plan:    Shortness of breath - Plan: SARS Coronavirus 2 [facility] order, Performed in [facility] hospital lab) Nasopharyngeal Nasopharyngeal Swab, Airborne and Contact precautions, US Thoracentesis Asp Pleural space w/IMG guide, SARS Coronavirus 2 [facility] order, Performed in [facility] hospital lab) Nasopharyngeal Nasopharyngeal Swab, Airborne and Contact precautions  Dehydration - Plan: 0.9 %  sodium chloride infusion  Cough - Plan: SARS Coronavirus 2 [facility] order, Performed in [facility] hospital lab) Nasopharyngeal Nasopharyngeal Swab, Airborne and Contact precautions, US Thoracentesis Asp Pleural space w/IMG guide, SARS Coronavirus 2 [facility] order, Performed in [facility] hospital lab) Nasopharyngeal Nasopharyngeal Swab, Airborne and Contact precautions  Port-A-Cath in place - Plan: heparin lock flush 100 unit/mL, sodium chloride flush (NS) 0.9 % injection 10 mL  Cholangiocarcinoma (HCC)   Cough with shortness of breath in the setting of an extrahepatic cholangiocarcinoma: Christopher Welch was sent for a chest x-ray prior to his appointment today with results returning showing a large right pleural effusion.  He has been scheduled for a thoracentesis tomorrow.  A sample of the specimen will be submitted for cytology.  A COVID-19 test was collected and returned negative today.   Extrahepatic cholangiocarcinoma: The patient is status post cycle 5, day 1 of cisplatin and gemcitabine which was dosed on 06/03/2019.  He is scheduled to be seen in follow-up on 06/24/2019.  Please see After Visit Summary for patient specific  instructions.  Future Appointments  Date Time Provider [place]  06/23/2019  1:30 PM [facility] 1 [facility] [place]  06/24/2019  7:45 AM [facility] LAB 4 [facility] None  06/24/2019  8:00 AM [facility] [place] None  06/24/2019  8:20 AM [name], MD [facility] None  06/24/2019  9:00 AM [facility] INFUSION [facility] None  07/02/2019  7:45 AM [facility] LAB 4 [facility] None  07/02/2019  8:00 AM [facility] [place] FLUSH [facility] None  07/02/2019  8:20 AM [name], MD [facility] None  07/02/2019  9:00 AM [facility] INFUSION [facility] None  07/03/2019 10:30 AM [facility] [place] FLUSH [facility] None    Orders Placed This Encounter  Procedures   SARS Coronavirus 2 [facility] order, Performed in [facility] hospital lab) Nasopharyngeal Nasopharyngeal Swab   US Thoracentesis Asp Pleural space w/IMG guide   Airborne and Contact precautions       Subjective:   Patient ID:  Christopher Welch is a 73 y.o. (DOB 11/05/1945) male.  Chief Complaint:  Chief Complaint  Patient presents with   Shortness of Breath    HPI Christopher Welch Is a 73 y.o. male with a diagnosis of an extrahepatic cholangiocarcinoma who is managed by Dr. [name] and is status post  cycle 5, day 1 of cisplatin and gemcitabine which was dosed on 06/03/2019.  He was recently admitted from 06/17/2019 to 06/20/2019 for abdominal pain with nausea and vomiting, hyperkalemia, hyponatremia, hyperglycemia, and acute kidney injury.  He presents to the clinic today in follow-up of his recent discharge.  He had a CT scan completed during his hospitalization which showed a moderate-sized right pleural effusion with associated right basilar atelectasis.  He contacted our office yesterday stating that he was experiencing more labored breathing with ambulation.  He  also reported slight congestion.  He denied fevers, chest pain, or sweats.  He was referred for a chest x-ray prior to his appointment today which showed a large right  pleural effusion.  His labs returned stable.   Medications: I have reviewed the patient's current medications.  Allergies:  Allergies  Allergen Reactions   Erythromycin     Severe Abdominal pain    Past Medical History:  Diagnosis Date   Diabetes mellitus type I, controlled ([place])    Dyslipidemia    ED (erectile dysfunction)    Essential hypertension    Gilbert's disease    Hepatitis B carrier [facility])     Past Surgical History:  Procedure Laterality Date   APPENDECTOMY  1964   BILIARY BRUSHING  01/21/2019   Procedure: BILIARY BRUSHING;  Surgeon: Carol Ada, MD;  [facility];  Service: Endoscopy;;   BILIARY STENT PLACEMENT N/A 01/21/2019   Procedure: BILIARY STENT PLACEMENT;  Surgeon: Carol Ada, MD;  [facility];  Service: Endoscopy;  Laterality: N/A;   ENDOSCOPIC RETROGRADE CHOLANGIOPANCREATOGRAPHY (ERCP) WITH PROPOFOL N/A 01/21/2019   Procedure: ENDOSCOPIC RETROGRADE CHOLANGIOPANCREATOGRAPHY (ERCP) WITH PROPOFOL;  Surgeon: Carol Ada, MD;  [facility];  Service: Endoscopy;  Laterality: N/A;   ESOPHAGOGASTRODUODENOSCOPY (EGD) WITH PROPOFOL N/A 01/21/2019   Procedure: ESOPHAGOGASTRODUODENOSCOPY (EGD) WITH PROPOFOL;  Surgeon: Carol Ada, MD;  [facility];  Service: Endoscopy;  Laterality: N/A;   FEMORAL HERNIA REPAIR     IR CHOLANGIOGRAM EXISTING TUBE  05/13/2019   IR CHOLANGIOGRAM EXISTING TUBE  06/08/2019   IR EXCHANGE BILIARY DRAIN  05/18/2019   IR EXCHANGE BILIARY DRAIN  06/08/2019   IR IMAGING GUIDED PORT INSERTION  03/08/2019   IR PERC CHOLECYSTOSTOMY  05/04/2019   LEFT HEART CATH AND CORONARY ANGIOGRAPHY N/A 05/19/2017   Procedure: LEFT HEART CATH AND CORONARY ANGIOGRAPHY;  Surgeon: Leonie Man, MD;  Location: [place] CV LAB;  Service: Cardiovascular;  Laterality: N/A;   SPHINCTEROTOMY  01/21/2019   Procedure: SPHINCTEROTOMY;  Surgeon: Carol Ada, MD;  [facility];  Service: Endoscopy;;     UPPER ESOPHAGEAL ENDOSCOPIC ULTRASOUND (EUS) N/A 01/21/2019   Procedure: UPPER ESOPHAGEAL ENDOSCOPIC ULTRASOUND (EUS);  Surgeon: Carol Ada, MD;  Location: [facility];  Service: Endoscopy;  Laterality: N/A;    Family History  Problem Relation Age of Onset   Diabetes Father     Social History   Socioeconomic History   Marital status: Single    Spouse name: Not on file   Number of children: Not on file   Years of education: Not on file   Highest education level: Not on file  Occupational History   Not on file  Social Needs   Financial resource strain: Not on file   Food insecurity    Worry: Not on file    Inability: Not on file   Transportation needs    Medical: Not on file    Non-medical: Not on file  Tobacco Use   Smoking status: Never Smoker   Smokeless tobacco: Never Used  Substance and Sexual Activity   Alcohol use: Yes    Alcohol/week: 7.0 standard drinks    Types: 7 Glasses of wine per week   Drug use: No   Sexual activity: Yes  Lifestyle   Physical activity    Days per week: Not on file    Minutes per session: Not on file   Stress: Not on file  Relationships   Social connections    Talks on phone: Not on  file    Gets together: Not on file    Attends religious service: Not on file    Active member of club or organization: Not on file    Attends meetings of clubs or organizations: Not on file    Relationship status: Not on file   Intimate partner violence    Fear of current or ex partner: Not on file    Emotionally abused: Not on file    Physically abused: Not on file    Forced sexual activity: Not on file  Other Topics Concern   Not on file  Social History Narrative   Lives alone in a one story home - "partner" No children.    .  Works part time at [employer].   Education: masters in education   Quit smoking in 1984. Did have passive smoke exposure prior to that with his father.   Drains 5 glasses of wine per week. 2 cups  of coffee daily.   Works out at [employer] regularly - 5 days per week.    Past Medical History, Surgical history, Social history, and Family history were reviewed and updated as appropriate.   Please see review of systems for further details on the patient's review from today.   Review of Systems:  Review of Systems  Constitutional: Positive for fatigue. Negative for chills, diaphoresis and fever.  HENT: Positive for congestion. Negative for postnasal drip, rhinorrhea and sore throat.   Respiratory: Positive for cough and shortness of breath. Negative for wheezing.   Cardiovascular: Negative for palpitations.  Gastrointestinal: Negative for constipation, diarrhea, nausea and vomiting.  Neurological: Negative for headaches.    Objective:   Physical Exam:  BP 124/76    Pulse 82    Temp 97.8 F (36.6 C) (Temporal)    Resp 18    Ht 5\' 7"  (1.702 m)    Wt 148 lb 1.6 oz (67.2 kg)    SpO2 100%    BMI 23.20 kg/m  ECOG: 1  Physical Exam Constitutional:      General: He is not in acute distress.    Appearance: He is not diaphoretic.  HENT:     Head: Normocephalic and atraumatic.     Right Ear: External ear normal.     Left Ear: External ear normal.     Mouth/Throat:     Pharynx: No oropharyngeal exudate.  Neck:     Musculoskeletal: Normal range of motion and neck supple.  Cardiovascular:     Rate and Rhythm: Normal rate and regular rhythm.     Heart sounds: Normal heart sounds. No murmur. No friction rub. No gallop.   Pulmonary:     Effort: Pulmonary effort is normal. No respiratory distress.     Breath sounds: Examination of the right-middle field reveals decreased breath sounds. Examination of the right-lower field reveals decreased breath sounds. Decreased breath sounds present. No wheezing or rales.  Chest:     Chest wall: There is dullness to percussion (Right middle and lower lung).     Comments: An accessed right chest wall Port-A-Cath is noted. Lymphadenopathy:      Cervical: No cervical adenopathy.  Skin:    General: Skin is warm and dry.     Findings: No erythema or rash.  Neurological:     Mental Status: He is alert.     Coordination: Coordination normal.  Psychiatric:        Behavior: Behavior normal.        Thought Content: Thought  content normal.        Judgment: Judgment normal.     Lab Review:     Component Value Date/Time   NA 135 06/22/2019 1250   NA 141 05/14/2017 0809   K 4.0 06/22/2019 1250   CL 99 06/22/2019 1250   CO2 26 06/22/2019 1250   GLUCOSE 186 (H) 06/22/2019 1250   BUN 16 06/22/2019 1250   BUN 11 05/14/2017 0809   CREATININE 1.25 (H) 06/22/2019 1250   CREATININE 1.16 12/05/2011 1117   CALCIUM 9.3 06/22/2019 1250   PROT 7.0 06/22/2019 1250   ALBUMIN 2.7 (L) 06/22/2019 1250   AST 25 06/22/2019 1250   ALT 29 06/22/2019 1250   ALKPHOS 58 06/22/2019 1250   BILITOT 0.2 (L) 06/22/2019 1250   GFRNONAA 58 (L) 06/22/2019 1250   GFRAA >60 06/22/2019 1250       Component Value Date/Time   WBC 10.4 06/22/2019 1250   WBC 8.7 06/19/2019 1058   RBC 3.33 (L) 06/22/2019 1250   HGB 9.9 (L) 06/22/2019 1250   HGB 14.3 05/14/2017 0809   HCT 30.4 (L) 06/22/2019 1250   HCT 41.4 05/14/2017 0809   PLT 646 (H) 06/22/2019 1250   PLT 228 05/14/2017 0809   MCV 91.3 06/22/2019 1250   MCV 95 05/14/2017 0809   MCH 29.7 06/22/2019 1250   MCHC 32.6 06/22/2019 1250   RDW 16.3 (H) 06/22/2019 1250   RDW 13.9 05/14/2017 0809   LYMPHSABS 2.6 06/22/2019 1250   MONOABS 1.6 (H) 06/22/2019 1250   EOSABS 0.2 06/22/2019 1250   BASOSABS 0.0 06/22/2019 1250   -------------------------------  Imaging from last 24 hours (if applicable):  Radiology interpretation: Dg Chest 2 View  Result Date: 06/22/2019 CLINICAL DATA:  labor breathing while ambulating. slightly congested, but denied any chest pain, fever, or night sweats. EXAM: CHEST - 2 VIEW COMPARISON:  CT abdomen 06/18/2019, chest x-ray 03/31/2015 FINDINGS: Large right pleural effusion  with consolidation/atelectasis in most of the right lung excluding the apex. Left lung clear. Right IJ power injectable port catheter extends to the cavoatrial junction. Heart size appears grossly normal. There is no convincing mediastinal shift. No pneumothorax. Endoscopic metallic stent appears stable in position. Regional bones unremarkable. IMPRESSION: 1. Large right pleural effusion Electronically Signed   By: Lucrezia Europe M.D.   On: 06/22/2019 12:30   US Abdomen Complete  Result Date: 06/18/2019 CLINICAL DATA:  Abdominal pain.  Biliary stent. EXAM: ABDOMEN ULTRASOUND COMPLETE COMPARISON:  05/03/2019 and 01/14/2019, abdominal MRI 01/20/2019 FINDINGS: Gallbladder: No significant gallstones or sludge. Resolution of the previous seen moderate amount of gallbladder sludge. Persistent minimal gallbladder wall thickening of 3.5 mm improved. Negative sonographic Murphy sign. No adjacent free fluid. Common bile duct: Diameter: 3.7 mm.  Common bile duct stent present. Liver: No focal lesion identified. Within normal limits in parenchymal echogenicity. Expected mild pneumobilia present due to presence of common bile duct stent. Portal vein is patent on color Doppler imaging with normal direction of blood flow towards the liver. IVC: No abnormality visualized. Pancreas: Visualized portion unremarkable. Spleen: Size and appearance within normal limits. Right Kidney: Length: 10.8 cm. Echogenicity within normal limits. No mass or hydronephrosis visualized. 2.3 cm simple cyst over the upper pole. 3.8 cm mildly complicated cyst over the mid to upper pole with internal septations slightly smaller and found to represent a cyst by previous MRI. Left Kidney: Length: 10.7 cm. Echogenicity within normal limits. No mass or hydronephrosis visualized. Abdominal aorta: No aneurysm visualized. Other findings: No free  fluid.  Right pleural effusion. IMPRESSION: Resolution of previous seen moderate gallbladder sludge. Persistent minimal  gallbladder wall thickening. No additional sonographic evidence to suggest cholecystitis. Biliary stent in place. Two stable right renal cysts. Right pleural effusion. Electronically Signed   By: Marin Olp M.D.   On: 06/18/2019 09:01   Ct Abdomen Pelvis W Contrast  Result Date: 06/18/2019 CLINICAL DATA:  Acute generalized abdominal pain. History of cholangiocarcinoma. Nausea and vomiting. Biliary drain removed 06/08/2019. EXAM: CT ABDOMEN AND PELVIS WITH CONTRAST TECHNIQUE: Multidetector CT imaging of the abdomen and pelvis was performed using the standard protocol following bolus administration of intravenous contrast. CONTRAST:  139mL OMNIPAQUE IOHEXOL 300 MG/ML  SOLN COMPARISON:  Abdominal ultrasound today.  Abdominal MRI 01/20/2019. FINDINGS: Lower chest: Moderate size right pleural effusion with associated compressive atelectasis. Left lung bases clear. Central venous catheter with tip over the cavoatrial junction. Calcified plaque over the coronary arteries. Hepatobiliary: Gallbladder slightly contracted with possible single stone present. Biliary stent in place and appears in adequate position with air density throughout the stent. Mild pneumobilia present. Subcentimeter cyst over the central aspect of the right lobe of the liver unchanged. No focal solid liver mass. Pancreas: Mild atrophy of the pancreas with calcifications throughout the pancreas likely due to previous pancreatitis. Biliary stent seen over the head of the pancreas. Spleen: Normal. Adrenals/Urinary Tract: Adrenal glands are normal. Kidneys are normal in size with several right renal cysts unchanged. Ureters and bladder are normal. Stomach/Bowel: Stomach and small bowel are normal. Previous appendectomy. Mild fecal retention throughout the colon. Vascular/Lymphatic: Mild calcified plaque over the abdominal aorta which is normal in caliber. No adenopathy. Reproductive: Normal. Other: No free fluid or focal inflammatory change. Small  left inguinal hernia containing only peritoneal fat. Musculoskeletal: Minimal degenerative change of the spine. IMPRESSION: 1.  No acute findings in the abdomen/pelvis. 2. Biliary stent in adequate position. No evidence of focal mass or adenopathy in the region of the pancreatic head or porta hepatis. 3. Moderate size right pleural effusion with associated right basilar atelectasis. 4. Possible single gallstone. Stable subcentimeter liver cyst. Stable right renal cysts. Small left inguinal hernia containing only peritoneal fat. 5. Aortic Atherosclerosis (ICD10-I70.0). Electronically Signed   By: Marin Olp M.D.   On: 06/18/2019 17:18   Ir Cholangiogram Existing Tube  Result Date: 06/08/2019 INDICATION: 73 year old male with a history of chronic cholecystitis and cholangiocarcinoma. He is not an operative candidate. He has an indwelling percutaneous cholecystostomy tube which frequently becomes malpositioned. It is currently leaking. He presents for cholangiogram and potential tube exchange EXAM: CHOLANGIOGRAM VIA EXISTING CATHETER; IR EXCHANGE BILARY DRAIN MEDICATIONS: None ANESTHESIA/SEDATION: None FLUOROSCOPY TIME:  Fluoroscopy Time: 0 minutes 12 seconds (28 mGy). COMPLICATIONS: None immediate. PROCEDURE: Informed written consent was obtained from the patient after a thorough discussion of the procedural risks, benefits and alternatives. All questions were addressed. Maximal Sterile Barrier Technique was utilized including caps, mask, sterile gowns, sterile gloves, sterile drape, hand hygiene and skin antiseptic. A timeout was performed prior to the initiation of the procedure. Initial fluoroscopic imaging demonstrates that the cholecystostomy tube has nearly completely pulled out of the gallbladder. There is redundant catheter just inside the ribcage heading cephalad toward the diaphragm before making an acute angle and heading inferior to the hepatic entry site. A gentle hand injection of contrast material  was performed demonstrating that just the tip of the cholecystostomy tube remains within the gallbladder lumen. The decision was made to proceed with tube exchange and attempted rescue of  this nearly displaced catheter. The existing catheter was cut and a Bentson wire advanced through the catheter as the catheter was pulled back slightly to reduce the redundancy. The Bentson wire was successfully navigated into the gallbladder lumen. However, as the tube was removed over the wire, it became evident that the retention suture was a tangled with the tube in the peritoneal space. When the tube was finally successfully removed, wire access to the gallbladder lumen had been lost due to the redundancy and manipulation. An angled 5 French catheter was advanced over the wire and efforts were made to salvage the tube access. This was unsuccessful. Additional hand injection of contrast was performed through the 5 French catheter in an effort to identify the path. This was unsuccessful. The gallbladder is collapsed. Further attempts at tube exchange were aborted. IMPRESSION: 1. Cholangiogram demonstrates a collapsed gallbladder and a nearly completely displaced percutaneous cholecystostomy tube with significant redundancy of the tube in the peritoneal space between the liver edge and ribcage. 2. Unsuccessful attempted rescue/exchange of the percutaneous cholecystostomy tube. PLAN: Given that the gallbladder is completely decompressed and the tube could not be successfully exchanged today, we made the decision to not place a new tube. We will see how this gentleman does. If he develops recurrent symptoms of cholecystitis, we will ultimately have to place a new tube. However, if his cholecystitis has resolved, he may not require tube placement in the future. I discussed this with the patient at length. We gave him a card with a number to call should he develop recurrent symptoms in the future to facilitate him coming in to have a  new cholecystostomy tube placed. Signed, Criselda Peaches, MD, [place] [facility] Radiology Electronically Signed   By: Jacqulynn Cadet M.D.   On: 06/08/2019 15:22   Ir Exchange Biliary Drain  Result Date: 06/08/2019 INDICATION: 74 year old male with a history of chronic cholecystitis and cholangiocarcinoma. He is not an operative candidate. He has an indwelling percutaneous cholecystostomy tube which frequently becomes malpositioned. It is currently leaking. He presents for cholangiogram and potential tube exchange EXAM: CHOLANGIOGRAM VIA EXISTING CATHETER; IR EXCHANGE BILARY DRAIN MEDICATIONS: None ANESTHESIA/SEDATION: None FLUOROSCOPY TIME:  Fluoroscopy Time: 0 minutes 12 seconds (28 mGy). COMPLICATIONS: None immediate. PROCEDURE: Informed written consent was obtained from the patient after a thorough discussion of the procedural risks, benefits and alternatives. All questions were addressed. Maximal Sterile Barrier Technique was utilized including caps, mask, sterile gowns, sterile gloves, sterile drape, hand hygiene and skin antiseptic. A timeout was performed prior to the initiation of the procedure. Initial fluoroscopic imaging demonstrates that the cholecystostomy tube has nearly completely pulled out of the gallbladder. There is redundant catheter just inside the ribcage heading cephalad toward the diaphragm before making an acute angle and heading inferior to the hepatic entry site. A gentle hand injection of contrast material was performed demonstrating that just the tip of the cholecystostomy tube remains within the gallbladder lumen. The decision was made to proceed with tube exchange and attempted rescue of this nearly displaced catheter. The existing catheter was cut and a Bentson wire advanced through the catheter as the catheter was pulled back slightly to reduce the redundancy. The Bentson wire was successfully navigated into the gallbladder  lumen. However, as the tube was removed over the wire, it became evident that the retention suture was a tangled with the tube in the peritoneal space. When the tube was finally successfully removed, wire access to the gallbladder lumen  had been lost due to the redundancy and manipulation. An angled 5 French catheter was advanced over the wire and efforts were made to salvage the tube access. This was unsuccessful. Additional hand injection of contrast was performed through the 5 French catheter in an effort to identify the path. This was unsuccessful. The gallbladder is collapsed. Further attempts at tube exchange were aborted. IMPRESSION: 1. Cholangiogram demonstrates a collapsed gallbladder and a nearly completely displaced percutaneous cholecystostomy tube with significant redundancy of the tube in the peritoneal space between the liver edge and ribcage. 2. Unsuccessful attempted rescue/exchange of the percutaneous cholecystostomy tube. PLAN: Given that the gallbladder is completely decompressed and the tube could not be successfully exchanged today, we made the decision to not place a new tube. We will see how this gentleman does. If he develops recurrent symptoms of cholecystitis, we will ultimately have to place a new tube. However, if his cholecystitis has resolved, he may not require tube placement in the future. I discussed this with the patient at length. We gave him a card with a number to call should he develop recurrent symptoms in the future to facilitate him coming in to have a new cholecystostomy tube placed. Signed, Criselda Peaches, MD, [place] [facility] Radiology Electronically Signed   By: Jacqulynn Cadet M.D.   On: 06/08/2019 15:22

## 2019-06-22 NOTE — Telephone Encounter (Signed)
Christopher Welch, he was recently hospitalized for AKI, a CT AP was done that showed moderate right side pleural effusion. He may need thora. Thanks for seeing him today. Regan Rakers

## 2019-06-23 ENCOUNTER — Ambulatory Visit (HOSPITAL_COMMUNITY)
Admission: RE | Admit: 2019-06-23 | Discharge: 2019-06-23 | Disposition: A | Discharge status: Home / Self Care | Payer: Medicare Other | Source: Ambulatory Visit | Attending: Medical | Admitting: Medical

## 2019-06-23 ENCOUNTER — Ambulatory Visit (HOSPITAL_COMMUNITY)
Admission: RE | Admit: 2019-06-23 | Discharge: 2019-06-23 | Disposition: A | Discharge status: Home / Self Care | Payer: Medicare Other | Source: Ambulatory Visit | Attending: Radiology | Admitting: Radiology

## 2019-06-23 DIAGNOSIS — J948 Other specified pleural conditions: Secondary | ICD-10-CM | POA: Diagnosis not present

## 2019-06-23 DIAGNOSIS — R059 Cough, unspecified: Secondary | ICD-10-CM

## 2019-06-23 DIAGNOSIS — Z9889 Other specified postprocedural states: Secondary | ICD-10-CM

## 2019-06-23 DIAGNOSIS — R05 Cough: Secondary | ICD-10-CM

## 2019-06-23 DIAGNOSIS — R0602 Shortness of breath: Secondary | ICD-10-CM

## 2019-06-23 DIAGNOSIS — J90 Pleural effusion, not elsewhere classified: Secondary | ICD-10-CM | POA: Insufficient documentation

## 2019-06-23 LAB — CANCER ANTIGEN 19-9: CA 19-9: 34 U/mL (ref 0–35)

## 2019-06-23 MED ORDER — LIDOCAINE HCL 1 % IJ SOLN
INTRAMUSCULAR | Status: AC
  Filled 2019-06-23: qty 10

## 2019-06-23 NOTE — Procedures (Addendum)
Ultrasound-guided diagnostic and therapeutic right thoracentesis performed yielding 2.1 liters of yellow fluid. No immediate complications. Follow-up chest x-ray pending. A portion of the fluid was sent to the lab for cytology. EBL none. Due to persistent pt coughing only the above amount of fluid was removed today.

## 2019-06-24 ENCOUNTER — Other Ambulatory Visit: Payer: Self-pay

## 2019-06-24 ENCOUNTER — Inpatient Hospital Stay (HOSPITAL_BASED_OUTPATIENT_CLINIC_OR_DEPARTMENT_OTHER): Payer: Medicare Other | Admitting: Hematology

## 2019-06-24 ENCOUNTER — Encounter: Payer: Self-pay | Admitting: Hematology

## 2019-06-24 ENCOUNTER — Inpatient Hospital Stay: Payer: Medicare Other

## 2019-06-24 ENCOUNTER — Telehealth: Payer: Self-pay | Admitting: Hematology

## 2019-06-24 VITALS — BP 110/66 | HR 74 | Resp 18

## 2019-06-24 VITALS — BP 104/64 | HR 87 | Temp 97.8°F | Resp 18 | Ht 67.0 in | Wt 142.5 lb

## 2019-06-24 DIAGNOSIS — R0600 Dyspnea, unspecified: Secondary | ICD-10-CM | POA: Diagnosis not present

## 2019-06-24 DIAGNOSIS — C240 Malignant neoplasm of extrahepatic bile duct: Secondary | ICD-10-CM | POA: Diagnosis not present

## 2019-06-24 DIAGNOSIS — R0602 Shortness of breath: Secondary | ICD-10-CM | POA: Diagnosis not present

## 2019-06-24 DIAGNOSIS — Z95828 Presence of other vascular implants and grafts: Secondary | ICD-10-CM

## 2019-06-24 DIAGNOSIS — C221 Intrahepatic bile duct carcinoma: Secondary | ICD-10-CM

## 2019-06-24 DIAGNOSIS — R6883 Chills (without fever): Secondary | ICD-10-CM | POA: Diagnosis not present

## 2019-06-24 DIAGNOSIS — Z20828 Contact with and (suspected) exposure to other viral communicable diseases: Secondary | ICD-10-CM | POA: Diagnosis not present

## 2019-06-24 DIAGNOSIS — R05 Cough: Secondary | ICD-10-CM | POA: Diagnosis not present

## 2019-06-24 LAB — CBC WITH DIFFERENTIAL (CANCER CENTER ONLY)
Abs Immature Granulocytes: 0.11 10*3/uL — ABNORMAL HIGH (ref 0.00–0.07)
Basophils Absolute: 0 10*3/uL (ref 0.0–0.1)
Basophils Relative: 0 %
Eosinophils Absolute: 0.1 10*3/uL (ref 0.0–0.5)
Eosinophils Relative: 1 %
HCT: 32.9 % — ABNORMAL LOW (ref 39.0–52.0)
Hemoglobin: 10.5 g/dL — ABNORMAL LOW (ref 13.0–17.0)
Immature Granulocytes: 1 %
Lymphocytes Relative: 20 %
Lymphs Abs: 2.2 10*3/uL (ref 0.7–4.0)
MCH: 29.4 pg (ref 26.0–34.0)
MCHC: 31.9 g/dL (ref 30.0–36.0)
MCV: 92.2 fL (ref 80.0–100.0)
Monocytes Absolute: 1.5 10*3/uL — ABNORMAL HIGH (ref 0.1–1.0)
Monocytes Relative: 13 %
Neutro Abs: 7.3 10*3/uL (ref 1.7–7.7)
Neutrophils Relative %: 65 %
Platelet Count: 636 10*3/uL — ABNORMAL HIGH (ref 150–400)
RBC: 3.57 MIL/uL — ABNORMAL LOW (ref 4.22–5.81)
RDW: 16.4 % — ABNORMAL HIGH (ref 11.5–15.5)
WBC Count: 11.2 10*3/uL — ABNORMAL HIGH (ref 4.0–10.5)
nRBC: 0 % (ref 0.0–0.2)

## 2019-06-24 LAB — CMP (CANCER CENTER ONLY)
ALT: 18 U/L (ref 0–44)
AST: 14 U/L — ABNORMAL LOW (ref 15–41)
Albumin: 2.4 g/dL — ABNORMAL LOW (ref 3.5–5.0)
Alkaline Phosphatase: 60 U/L (ref 38–126)
Anion gap: 9 (ref 5–15)
BUN: 19 mg/dL (ref 8–23)
CO2: 26 mmol/L (ref 22–32)
Calcium: 8.9 mg/dL (ref 8.9–10.3)
Chloride: 100 mmol/L (ref 98–111)
Creatinine: 1.33 mg/dL — ABNORMAL HIGH (ref 0.61–1.24)
GFR, Est AFR Am: >60 mL/min (ref >60)
GFR, Estimated: 53 mL/min — ABNORMAL LOW (ref >60)
Glucose, Bld: 240 mg/dL — ABNORMAL HIGH (ref 70–99)
Potassium: 4.7 mmol/L (ref 3.5–5.1)
Sodium: 135 mmol/L (ref 135–145)
Total Bilirubin: 0.3 mg/dL (ref 0.3–1.2)
Total Protein: 6.4 g/dL — ABNORMAL LOW (ref 6.5–8.1)

## 2019-06-24 LAB — CYTOLOGY - NON PAP

## 2019-06-24 MED ORDER — SODIUM CHLORIDE 0.9 % IV SOLN
INTRAVENOUS | Status: DC
  Filled 2019-06-24: qty 250

## 2019-06-24 MED ORDER — HEPARIN SOD (PORK) LOCK FLUSH 100 UNIT/ML IV SOLN
500.0000 [IU] | Freq: Once | INTRAVENOUS | Status: AC | PRN
  Administered 2019-06-24: 500 [IU]
  Filled 2019-06-24: qty 5

## 2019-06-24 MED ORDER — SODIUM CHLORIDE 0.9% FLUSH
10.0000 mL | INTRAVENOUS | Status: DC | PRN
  Administered 2019-06-24: 10 mL
  Filled 2019-06-24: qty 10

## 2019-06-24 MED ORDER — AMOXICILLIN-POT CLAVULANATE 875-125 MG PO TABS: 1.0000 | ORAL_TABLET | Freq: Two times a day (BID) | ORAL | 0 refills | Status: DC

## 2019-06-24 MED ORDER — SODIUM CHLORIDE 0.9 % IV SOLN
Freq: Once | INTRAVENOUS | Status: AC
  Administered 2019-06-24: 09:00:00 via INTRAVENOUS
  Filled 2019-06-24: qty 250

## 2019-06-24 MED ORDER — SODIUM CHLORIDE 0.9% FLUSH
10.0000 mL | INTRAVENOUS | Status: DC | PRN
  Administered 2019-06-24: 08:00:00 10 mL
  Filled 2019-06-24: qty 10

## 2019-06-24 NOTE — Telephone Encounter (Signed)
Scheduled appt per 9/24 los cisplatin and gemcitabine .  No treatment plan for 10/8 was active.  Patient aware of his appt dates and time.

## 2019-06-24 NOTE — Patient Instructions (Signed)
Coronavirus (COVID-19) Are you at risk?  Are you at risk for the Coronavirus (COVID-19)?  To be considered HIGH RISK for Coronavirus (COVID-19), you have to meet the following criteria:  . Traveled to [place], [place], [place], [place] or [place]; or in the [place] to [place], [place], [place], or [place]; and have fever, cough, and shortness of breath within the last 2 weeks of travel OR . Been in close contact with a person diagnosed with COVID-19 within the last 2 weeks and have fever, cough, and shortness of breath . IF YOU DO NOT MEET THESE CRITERIA, YOU ARE CONSIDERED LOW RISK FOR COVID-19.  What to do if you are HIGH RISK for COVID-19?  . If you are having a medical emergency, call 911. . Seek medical care right away. Before you go to a doctor's office, urgent care or emergency department, call ahead and tell them about your recent travel, contact with someone diagnosed with COVID-19, and your symptoms. You should receive instructions from your physician's office regarding next steps of care.  . When you arrive at healthcare provider, tell the healthcare staff immediately you have returned from visiting [place], [place], [place], [place] or [place]; or traveled in the [place] to [place], [place], [place], or [place]; in the last two weeks or you have been in close contact with a person diagnosed with COVID-19 in the last 2 weeks.   . Tell the health care staff about your symptoms: fever, cough and shortness of breath. . After you have been seen by a medical provider, you will be either: o Tested for (COVID-19) and discharged home on quarantine except to seek medical care if symptoms worsen, and asked to  - Stay home and avoid contact with others until you get your results (4-5 days)  - Avoid travel on public transportation if possible (such as bus, train, or airplane) or o Sent to the Emergency Department by EMS for evaluation, COVID-19 testing, and possible  admission depending on your condition and test results.  What to do if you are LOW RISK for COVID-19?  Reduce your risk of any infection by using the same precautions used for avoiding the common cold or flu:  . Wash your hands often with soap and warm water for at least 20 seconds.  If soap and water are not readily available, use an alcohol-based hand sanitizer with at least 60% alcohol.  . If coughing or sneezing, cover your mouth and nose by coughing or sneezing into the elbow areas of your shirt or coat, into a tissue or into your sleeve (not your hands). . Avoid shaking hands with others and consider head nods or verbal greetings only. . Avoid touching your eyes, nose, or mouth with unwashed hands.  . Avoid close contact with people who are sick. . Avoid places or events with large numbers of people in one location, like concerts or sporting events. . Carefully consider travel plans you have or are making. . If you are planning any travel outside or inside the [place], visit the CDC's Travelers' Health webpage for the latest health notices. . If you have some symptoms but not all symptoms, continue to monitor at home and seek medical attention if your symptoms worsen. . If you are having a medical emergency, call 911.   ADDITIONAL HEALTHCARE OPTIONS FOR PATIENTS  [facility] / e-Visit: [email or web address]         [facility]: [phone]  [facility]: [phone]                   [facility]: [phone]    Dehydration, Adult  Dehydration is a condition in which there is not enough fluid or water in the body. This happens when you lose more fluids than you take in. Important organs, such as the kidneys, brain, and heart, cannot function without a proper amount of fluids. Any loss of fluids from the body can lead to dehydration. Dehydration can range from mild to severe. This condition  should be treated right away to prevent it from becoming severe. What are the causes? This condition may be caused by:  Vomiting.  Diarrhea.  Excessive sweating, such as from heat exposure or exercise.  Not drinking enough fluid, especially: ? When ill. ? While doing activity that requires a lot of energy.  Excessive urination.  Fever.  Infection.  Certain medicines, such as medicines that cause the body to lose excess fluid (diuretics).  Inability to access safe drinking water.  Reduced physical ability to get adequate water and food. What increases the risk? This condition is more likely to develop in people:  Who have a poorly controlled long-term (chronic) illness, such as diabetes, heart disease, or kidney disease.  Who are age 65 or older.  Who are disabled.  Who live in a place with high altitude.  Who play endurance sports. What are the signs or symptoms? Symptoms of mild dehydration may include:  Thirst.  Dry lips.  Slightly dry mouth.  Dry, warm skin.  Dizziness. Symptoms of moderate dehydration may include:  Very dry mouth.  Muscle cramps.  Dark urine. Urine may be the color of tea.  Decreased urine production.  Decreased tear production.  Heartbeat that is irregular or faster than normal (palpitations).  Headache.  Light-headedness, especially when you stand up from a sitting position.  Fainting (syncope). Symptoms of severe dehydration may include:  Changes in skin, such as: ? Cold and clammy skin. ? Blotchy (mottled) or pale skin. ? Skin that does not quickly return to normal after being lightly pinched and released (poor skin turgor).  Changes in body fluids, such as: ? Extreme thirst. ? No tear production. ? Inability to sweat when body temperature is high, such as in hot weather. ? Very little urine production.  Changes in vital signs, such as: ? Weak pulse. ? Pulse that is more than 100 beats a minute when sitting  still. ? Rapid breathing. ? Low blood pressure.  Other changes, such as: ? Sunken eyes. ? Cold hands and feet. ? Confusion. ? Lack of energy (lethargy). ? Difficulty waking up from sleep. ? Short-term weight loss. ? Unconsciousness. How is this diagnosed? This condition is diagnosed based on your symptoms and a physical exam. Blood and urine tests may be done to help confirm the diagnosis. How is this treated? Treatment for this condition depends on the severity. Mild or moderate dehydration can often be treated at home. Treatment should be started right away. Do not wait until dehydration becomes severe. Severe dehydration is an emergency and it needs to be treated in a hospital. Treatment for mild dehydration may include:  Drinking more fluids.  Replacing salts and minerals in your blood (electrolytes) that you may have lost. Treatment for moderate dehydration may include:  Drinking an oral rehydration solution (ORS). This is a drink that helps you replace fluids and electrolytes (rehydrate). It can be found at pharmacies and retail   stores. Treatment for severe dehydration may include:  Receiving fluids through an IV tube.  Receiving an electrolyte solution through a feeding tube that is passed through your nose and into your stomach (nasogastric tube, or NG tube).  Correcting any abnormalities in electrolytes.  Treating the underlying cause of dehydration. Follow these instructions at home:  If directed by your health care provider, drink an ORS: ? Make an ORS by following instructions on the package. ? Start by drinking small amounts, about  cup (120 mL) every 5-10 minutes. ? Slowly increase how much you drink until you have taken the amount recommended by your health care provider.  Drink enough clear fluid to keep your urine clear or pale yellow. If you were told to drink an ORS, finish the ORS first, then start slowly drinking other clear fluids. Drink fluids such as:  ? Water. Do not drink only water. Doing that can lead to having too little salt (sodium) in the body (hyponatremia). ? Ice chips. ? Fruit juice that you have added water to (diluted fruit juice). ? Low-calorie sports drinks.  Avoid: ? Alcohol. ? Drinks that contain a lot of sugar. These include high-calorie sports drinks, fruit juice that is not diluted, and soda. ? Caffeine. ? Foods that are greasy or contain a lot of fat or sugar.  Take over-the-counter and prescription medicines only as told by your health care provider.  Do not take sodium tablets. This can lead to having too much sodium in the body (hypernatremia).  Eat foods that contain a healthy balance of electrolytes, such as bananas, oranges, potatoes, tomatoes, and spinach.  Keep all follow-up visits as told by your health care provider. This is important. Contact a health care provider if:  You have abdominal pain that: ? Gets worse. ? Stays in one area (localizes).  You have a rash.  You have a stiff neck.  You are more irritable than usual.  You are sleepier or more difficult to wake up than usual.  You feel weak or dizzy.  You feel very thirsty.  You have urinated only a small amount of very dark urine over 6-8 hours. Get help right away if:  You have symptoms of severe dehydration.  You cannot drink fluids without vomiting.  Your symptoms get worse with treatment.  You have a fever.  You have a severe headache.  You have vomiting or diarrhea that: ? Gets worse. ? Does not go away.  You have blood or green matter (bile) in your vomit.  You have blood in your stool. This may cause stool to look black and tarry.  You have not urinated in 6-8 hours.  You faint.  Your heart rate while sitting still is over 100 beats a minute.  You have trouble breathing. This information is not intended to replace advice given to you by your health care provider. Make sure you discuss any questions you have  with your health care provider. Document Released: 09/16/2005 Document Revised: 08/29/2017 Document Reviewed: 11/10/2015 [employer] Patient Education  2020 [employer]  

## 2019-06-24 NOTE — Patient Instructions (Signed)

## 2019-06-28 DIAGNOSIS — I10 Essential (primary) hypertension: Secondary | ICD-10-CM | POA: Diagnosis not present

## 2019-06-28 DIAGNOSIS — E119 Type 2 diabetes mellitus without complications: Secondary | ICD-10-CM | POA: Diagnosis not present

## 2019-06-28 DIAGNOSIS — E7800 Pure hypercholesterolemia, unspecified: Secondary | ICD-10-CM | POA: Diagnosis not present

## 2019-06-29 NOTE — Progress Notes (Signed)
[place]   [phone] [phone]   Clinic Follow up Note   Patient Care Team: [name], DO as PCP - General (Family Medicine)  Date of Service:  07/02/2019  CHIEF COMPLAINT: F/u of ExtrahepaticCholangiocarcinoma  SUMMARY OF ONCOLOGIC HISTORY: Oncology History  Cholangiocarcinoma ([place])  01/14/2019 Imaging   US Abdomen 01/14/19  IMPRESSION: There is significant intrahepatic and extrahepatic biliary dilatation present, concerning for distal common bile duct obstruction. Further evaluation with CT scan or MRCP is recommended. Correlation with liver function tests is recommended as well. These results will be called to the ordering clinician or representative by the Radiologist Assistant, and communication documented in the PACS or zVision Dashboard.   Probable large amount of sludge seen within gallbladder lumen with mild gallbladder wall thickening. However, no cholelithiasis, pericholecystic fluid or sonographic Murphy's sign is noted.   4.2 cm septated cyst seen in upper pole of right kidney consistent with Bosniak type 2 lesion. Follow-up ultrasound in 1 year is recommended to ensure stability.   01/20/2019 Imaging   MRI abdomen 01/20/19  IMPRESSION: 1. Findings are highly concerning for central tumor in the biliary tract at the confluence of the common hepatic duct, cystic duct and common bile ducts. Further clinical evaluation for potential cholangiocarcinoma is strongly recommended. 2. Mild ductal dilatation of the main pancreatic duct throughout the distal body and tail of the pancreas where there is also some associated side branch ectasia. This may suggest a pancreatic ductal stricture. No obstructing pancreatic neoplasm identified. 3. Aortic atherosclerosis.   01/21/2019 Initial Biopsy   Diagnosis 01/21/19  BILE DUCT BRUSHING (SPECIMEN 1 OF 1 COLLECTED 01/21/2019) ADENOCARCINOMA.   01/21/2019 Procedure   ERCP By Dr hung  01/21/19 IMRPESSION - The major papilla appeared normal. - A single localized biliary stricture was found in the middle third of the main bile duct. - The entire main bile duct and upper third of the main bile duct were dilated, secondary to a stricture. - A biliary sphincterotomy was performed. - Cells for cytology obtained in the middle third of the main bile duct. - One plastic stent was placed into the common bile duct.  EUS by Dr hung 01/21/19  IMPRESSION - There was dilation in the middle third of the main bile duct and in the upper third of the main bile duct which measured up to 15 mm. - There was a suggestion of a stricture in the middle third of the main bile duct. - No specimens collected.   02/03/2019 Imaging   CT CAP at [facility] 02/03/19  Impression:  1. There is mild intrahepatic and extrahepatic biliary ductal dilation and diffuse common bile duct wall thickening with stent in place. There is abnormal soft tissue measuring approximately 1.6 cm between the common hepatic artery, portal vein and common bile duct, worrisome for the known cholangiocarcinoma.  2. Periportal lymphadenopathy which may represent nodal metastasis.  4. Marked pancreatic atrophy and duct dilation involving the distal body and tail the pancreas where there is a coarse calcification. These findings are favored to represent stenosis from intraductal stones though an underlying stricture cannot be completely excluded.  5. Atypical symmetric prominent fat stranding of the bilateral lower abdominal wall. Correlate with surgical history or history of history of trauma.   03/08/2019 Initial Diagnosis   Cholangiocarcinoma ([place])   03/12/2019 -  Chemotherapy   gemcitabine and cisplatin on days 1 and 8 every 21 days starting 03/12/19. Abraxane added with cycle 2. Added GCSF [name]) to  day 9 starting cycle 2.    03/19/2019 Cancer Staging   Staging form: Perihilar Bile Ducts, AJCC 8th Edition - Clinical:  Stage Unknown (cTX, cN0, cM0) - Signed by [name], MD on 03/19/2019   05/24/2019 Imaging   CT CAP W Contrast at [facility]  1.  Interval placement of covered metallic biliary stent. No progressive dilation of the biliary tree and resulting resolution of the prior main pancreatic duct dilation. 2.  Evidence of some subtle vascular contour deformity involving the hepatic artery, suspected to be from tumoral contact and/or posttreatment changes. 3.  New small right pleural effusion with overlying mild airspace disease. Secondary findings which may indicate a component of aspiration. 4.  No convincing evidence of new disseminated metastatic disease. 5.  Additional findings as discussed above.      CURRENT THERAPY:  gemcitabine and cisplatin on days 1 and 8 every 21 daysstarting 03/12/19. Abraxane added with cycle 2.AddedGCSF ([name])to day 9 startingcycle 2. Chemo held since 06/03/19 due to poor toleration. Chemo restarted with Gem/Cis with C6 on 07/02/19.   INTERVAL HISTORY:  Christopher Welch is here for a follow up and treatment. He presents to the clinic alone. He notes he feels better. His breathing has improved and will occasionally be SOB from walking from parking lot. He did not have to stop. He has been able to eat more, 3 meals a day, boost. He notes he has not had nausea since his last infusion. His weight is stable. His draining tube was removed. He notes normal BMs and denies cough with phlegm. I reviewed his medication list with him.    REVIEW OF SYSTEMS:   Constitutional: Denies fevers, chills or abnormal weight loss Eyes: Denies blurriness of vision Ears, nose, mouth, throat, and face: Denies mucositis or sore throat Respiratory: Denies cough, wheezes (+) Mild SOB upon exertion  Cardiovascular: Denies palpitation, chest discomfort or lower extremity swelling Gastrointestinal:  Denies nausea, heartburn or change in bowel habits Skin: Denies abnormal skin rashes Lymphatics: Denies new  lymphadenopathy or easy bruising Neurological:Denies numbness, tingling or new weaknesses Behavioral/Psych: Mood is stable, no new changes  All other systems were reviewed with the patient and are negative.  MEDICAL HISTORY:  Past Medical History:  Diagnosis Date  . Diabetes mellitus type I, controlled ([place])   . Dyslipidemia   . ED (erectile dysfunction)   . Essential hypertension   . Gilbert's disease   . Hepatitis B carrier [facility])     SURGICAL HISTORY: Past Surgical History:  Procedure Laterality Date  . APPENDECTOMY  1964  . BILIARY BRUSHING  01/21/2019   Procedure: BILIARY BRUSHING;  Surgeon: Carol Ada, MD;  Location: [facility];  Service: Endoscopy;;  . BILIARY STENT PLACEMENT N/A 01/21/2019   Procedure: BILIARY STENT PLACEMENT;  Surgeon: Carol Ada, MD;  Location: [facility];  Service: Endoscopy;  Laterality: N/A;  . ENDOSCOPIC RETROGRADE CHOLANGIOPANCREATOGRAPHY (ERCP) WITH PROPOFOL N/A 01/21/2019   Procedure: ENDOSCOPIC RETROGRADE CHOLANGIOPANCREATOGRAPHY (ERCP) WITH PROPOFOL;  Surgeon: Carol Ada, MD;  Location: [facility];  Service: Endoscopy;  Laterality: N/A;  . ESOPHAGOGASTRODUODENOSCOPY (EGD) WITH PROPOFOL N/A 01/21/2019   Procedure: ESOPHAGOGASTRODUODENOSCOPY (EGD) WITH PROPOFOL;  Surgeon: Carol Ada, MD;  Location: [facility];  Service: Endoscopy;  Laterality: N/A;  . FEMORAL HERNIA REPAIR    . IR CHOLANGIOGRAM EXISTING TUBE  05/13/2019  . IR CHOLANGIOGRAM EXISTING TUBE  06/08/2019  . IR EXCHANGE BILIARY DRAIN  05/18/2019  . IR EXCHANGE BILIARY DRAIN  06/08/2019  . IR IMAGING GUIDED PORT INSERTION  03/08/2019  . IR PERC CHOLECYSTOSTOMY  05/04/2019  . LEFT HEART CATH AND CORONARY ANGIOGRAPHY N/A 05/19/2017   Procedure: LEFT HEART CATH AND CORONARY ANGIOGRAPHY;  Surgeon: Leonie Man, MD;  Location: [facility] [facility];  Service: Cardiovascular;  Laterality: N/A;  . SPHINCTEROTOMY  01/21/2019   Procedure: SPHINCTEROTOMY;  Surgeon: Carol Ada, MD;   Location: [facility];  Service: Endoscopy;;  . UPPER ESOPHAGEAL ENDOSCOPIC ULTRASOUND (EUS) N/A 01/21/2019   Procedure: UPPER ESOPHAGEAL ENDOSCOPIC ULTRASOUND (EUS);  Surgeon: Carol Ada, MD;  Location: [facility];  Service: Endoscopy;  Laterality: N/A;    I have reviewed the social history and family history with the patient and they are unchanged from previous note.  ALLERGIES:  is allergic to erythromycin.  MEDICATIONS:  Current Outpatient Medications  Medication Sig Dispense Refill  . acetaminophen (TYLENOL) 500 MG tablet Take 1,000 mg by mouth 2 (two) times daily as needed for moderate pain or headache.    Marland Kitchen amLODipine (NORVASC) 5 MG tablet Take 5 mg by mouth daily.     Marland Kitchen atenolol (TENORMIN) 25 MG tablet Take 25 mg by mouth daily. Once a day     . atorvastatin (LIPITOR) 10 MG tablet Take 10 mg by mouth daily.    . Blood Glucose Monitoring Suppl (ACCU-CHEK NANO SMARTVIEW) W/DEVICE KIT 1 kit by Does not apply route 2 (two) times daily. (Patient taking differently: 1 kit by Does not apply route See admin instructions. Test blood sugars 12x's daily) 1 kit 0  . glucose blood test strip Test 3 times a day. (Patient taking differently: 1 each by Other route See admin instructions. Test 12 times a day.) 300 each Prn  . insulin NPH Human (NOVOLIN N) 100 UNIT/ML injection Inject 20 Units into the skin at bedtime.     . insulin regular (NOVOLIN R) 100 units/mL injection Inject 20 Units into the skin 3 (three) times daily before meals.     . lidocaine-prilocaine (EMLA) cream Apply to affected area once 30 g 3  . lisinopril (ZESTRIL) 20 MG tablet Take 20 mg by mouth daily.     . magnesium oxide (MAG-OX) 400 MG tablet TAKE 1 TABLET BY MOUTH EVERY DAY (Patient taking differently: Take 400 mg by mouth daily. ) 90 tablet 0  . metFORMIN (GLUCOPHAGE) 1000 MG tablet Take 2,000 mg by mouth every evening.     . ondansetron (ZOFRAN) 8 MG tablet TAKE 1 TABLET BY MOUTH 2 TIMES DAY AS NEEDED. START ON 3RD  DAY AFTER CHEMOTHERAPY (Patient taking differently: Take 8 mg by mouth 2 (two) times daily as needed. Start on 3rd day after chemotherapy) 30 tablet 1  . oxycodone (OXY-IR) 5 MG capsule Take 1 capsule (5 mg total) by mouth every 6 (six) hours as needed for pain. 60 capsule 0  . prochlorperazine (COMPAZINE) 10 MG tablet TAKE 1 TABLET (10 MG TOTAL) BY MOUTH EVERY 6 (SIX) HOURS AS NEEDED (NAUSEA OR VOMITING). (Patient taking differently: Take 10 mg by mouth every 6 (six) hours as needed for nausea or vomiting (Nausea or vomiting). ) 30 tablet 1  . zolpidem (AMBIEN) 5 MG tablet Take 1-2 tablets (5-10 mg total) by mouth at bedtime as needed for sleep. 60 tablet 0   No current facility-administered medications for this visit.    Facility-Administered Medications Ordered in Other Visits  Medication Dose Route Frequency Provider Last Rate Last Dose  . heparin lock flush 100 unit/mL  500 Units Intracatheter Once PRN [name], MD      .  sodium chloride 0.9 % 1,000 mL with potassium chloride 20 mEq, magnesium sulfate 4 g infusion   Intravenous Once [name], MD      . sodium chloride flush (NS) 0.9 % injection 10 mL  10 mL Intracatheter PRN [name], MD        PHYSICAL EXAMINATION: ECOG PERFORMANCE STATUS: 1 - Symptomatic but completely ambulatory  Vitals:   07/02/19 0828  BP: 132/72  Pulse: 86  Resp: 18  Temp: 98.2 F (36.8 C)  SpO2: 98%   Filed Weights   07/02/19 0828  Weight: 143 lb 1.6 oz (64.9 kg)    GENERAL:alert, no distress and comfortable SKIN: skin color, texture, turgor are normal, no rashes or significant lesions EYES: normal, Conjunctiva are pink and non-injected, sclera clear  NECK: supple, thyroid normal size, non-tender, without nodularity LYMPH:  no palpable lymphadenopathy in the cervical, axillary  LUNGS: clear to auscultation and percussion (+) decreased lung sound at base of left lung HEART: regular rate & rhythm and no murmurs and no lower extremity edema  ABDOMEN:abdomen soft, non-tender and normal bowel sounds (+) Abdominal incision healed well  Musculoskeletal:no cyanosis of digits and no clubbing  NEURO: alert & oriented x 3 with fluent speech, no focal motor/sensory deficits  LABORATORY DATA:  I have reviewed the data as listed CBC Latest Ref Rng & Units 07/02/2019 06/24/2019 06/22/2019  WBC 4.0 - 10.5 K/uL 7.8 11.2(H) 10.4  Hemoglobin 13.0 - 17.0 g/dL 10.4(L) 10.5(L) 9.9(L)  Hematocrit 39.0 - 52.0 % 32.3(L) 32.9(L) 30.4(L)  Platelets 150 - 400 K/uL 353 636(H) 646(H)     CMP Latest Ref Rng & Units 07/02/2019 06/24/2019 06/22/2019  Glucose 70 - 99 mg/dL 292(H) 240(H) 186(H)  BUN 8 - 23 mg/dL _0 Creatinine 0.61 - 1.24 mg/dL 1.22 1.33(H) 1.25(H)  Sodium 135 - 145 mmol/L 136 135 135  Potassium 3.5 - 5.1 mmol/L 4.2 4.7 4.0  Chloride 98 - 111 mmol/L 102 100 99  CO2 22 - 32 mmol/L _1 Calcium 8.9 - 10.3 mg/dL 8.7(L) 8.9 9.3  Total Protein 6.5 - 8.1 g/dL 6.5 6.4(L) 7.0  Total Bilirubin 0.3 - 1.2 mg/dL 0.3 0.3 0.2(L)  Alkaline Phos 38 - 126 U/L 52 60 58  AST 15 - 41 U/L 20 14(L) 25  ALT 0 - 44 U/L _2 RADIOGRAPHIC STUDIES: I have personally reviewed the radiological images as listed and agreed with the findings in the report. No results found.   ASSESSMENT & PLAN:  Christopher Welch is a 73 y.o. male with   1. Extrahepatic cholangiocarcinoma, cTxN0M0 -He wasdiagnosedin 12/2018. His work up revealed malignant stricture and CBD obstruction required stenting. Brushing revealed adenocarcinoma. His extrahepatic cholangiocarcinoma was not resectable due to vascular invasion(surgeon - Dr. Zenia Resides at [facility]) -He has started first line chemogemcitabine and cisplatin on days 1 and 8 every 21 dayson 03/12/19. Abraxane added with cycle 2.AddedGCSF ([name])to day 9 startingcycle 2. -Heinitially tolerated chemotherapy well, but he has developed new symptoms, decreased performance status negative, chemo has been descalated  and held.  -[facility], Dr. Carlis Abbott recommended continue chemo -He clinically improved afterpercutaneous gallbladder drainage tubewas placedforacute cholecystitisand I restarted chemo on 06/03/19. Due to the leakage of the draining tube, it was removed on 9/8.  -Due to significant nausea, weight loss and poor toleration which required hospitalization, chemo was held since 06/03/19. He has much improved and doing better, overall  adequate to restart today.  -Labs reviewed, CBC and CMP WNL except Hg 10.4, BG 292, Ca 8.7, Albumin 3. Mag normal. Overall adequate to proceed with Gem/Cis today at dose reduction . Will hold Abraxane today.   -I discussed considering chemo every 2 weeks for this cycle given poor toleration of weekly regimen. Will skip chemo next week and discuss this further at next visit.  -Next scan week of 10/28, he will see Dr. Carlis Abbott on 11/2 -F/u in 2 weeks    2.History of acute cholecystitis -He washospitalizedon 05/03/19.  -He had a percutaneous gallbladder drainage tube placed by IR8/5/20.It was removed on 9/8 due to leakage  -Hiscurrent abdominal pain is probably related to the procedure, I encouraged him to use pain medication as needed -No clinical high concern for acute cholecystitis we will continue to monitoring clinically  3. New right-sided pleural effusion -He underwent right thoracentesis yesterday with 2.1 is removed, cytology negative  -Prior Chest x-ray yesterday did not show evidence of pneumonia. However given he has productive cough, chills, adequate white count, I treated him with Augmentin 875 mg twice daily for 7 days for presumed acute bronchitis -He had Thoracentesis on 06/23/19 with 2.1 Liters removed.  -His breathing has improved. Only has very mild SOB upon exertion. He has decreased breath sound at base of left lung on exam today (07/02/19)  4. Weight lossand low appetite -secondary to malignancyand chemo  -he presented with 20 lbs weight loss  -We reviewed nutrition support withGlucerna -Continue to f/u with dietician -His appetitehas dropped significantly with recent cholecystitis and secondary to chemo due to nausea.  -When off treatment he has no nausea and has been able to eat more. I encouraged him to continue working on eating more to gain weight back.  -I encourage him to continuenutritional supplement (carnation breakfast and Boost)to 3-5 bottles aday.  5. DM -managed by PCP Dr. Theda Sers -he has had DM for 20 years, he is compliant with regimen and knows how to adjust BG -on insulin, he knows how to adjust dose based on his BG level, but overall not well controlled, I encourage him to f/u with Dr. Theda Sers -Due to elevated BG secondary to chemo pre-meds, he has been needing to increase his insulin but working on controlling it.  6. CAD, HL, HTN -followed by cardiologist Dr. Ellyn Hack -on amlodipine, atenolol, lisinopril, and statin -controlled and stable    7. AKI -Cr previously 1.33 on 06/24/19. He was given IV Fluids that day.  -Cr normalized at 1.22 today (07/02/19)  PLAN: -Labs reviewed and adequate to proceed with Gem/Cis today at dose reduction of gemcitabine to 828m/m2, off chemo next week  -Labs flush, chemo Cis/Gem and f/u on 10/15 and 10/22 -CT CAP W Contrast around 10/28   No problem-specific Assessment & Plan notes found for this encounter.   Orders Placed This Encounter  Procedures  . CT Abdomen Pelvis W Contrast    Standing Status:   Future    Standing Expiration Date:   07/01/2020    Order Specific Question:   If indicated for the ordered procedure, I authorize the administration of contrast media per Radiology protocol    Answer:   Yes    Order Specific Question:   Preferred imaging location?    [facility]   Order Specific Question:   Is Oral Contrast requested for this exam?    Answer:   Yes, Per Radiology protocol     Order Specific Question:  Radiology Contrast Protocol - do NOT remove file path    Answer:   \\[email or web address]  . CT Chest W Contrast    Standing Status:   Future    Standing Expiration Date:   07/01/2020    Order Specific Question:   If indicated for the ordered procedure, I authorize the administration of contrast media per Radiology protocol    Answer:   Yes    Order Specific Question:   Preferred imaging location?    Answer:   [facility]    Order Specific Question:   Radiology Contrast Protocol - do NOT remove file path    Answer:   \\[email or web address]   All questions were answered. The patient knows to call the clinic with any problems, questions or concerns. No barriers to learning was detected. I spent 20 minutes counseling the patient face to face. The total time spent in the appointment was 25 minutes and more than 50% was on counseling and review of test results     [name], MD 07/02/2019   I, Joslyn Devon, am acting as scribe for [name], MD.   I have reviewed the above documentation for accuracy and completeness, and I agree with the above.

## 2019-07-02 ENCOUNTER — Inpatient Hospital Stay (HOSPITAL_BASED_OUTPATIENT_CLINIC_OR_DEPARTMENT_OTHER): Payer: Medicare Other | Admitting: Hematology

## 2019-07-02 ENCOUNTER — Inpatient Hospital Stay: Payer: Medicare Other | Attending: Hematology

## 2019-07-02 ENCOUNTER — Encounter: Payer: Self-pay | Admitting: Hematology

## 2019-07-02 ENCOUNTER — Other Ambulatory Visit: Payer: Self-pay

## 2019-07-02 ENCOUNTER — Inpatient Hospital Stay: Payer: Medicare Other

## 2019-07-02 VITALS — BP 132/72 | HR 86 | Temp 98.2°F | Resp 18 | Ht 67.0 in | Wt 143.1 lb

## 2019-07-02 DIAGNOSIS — E109 Type 1 diabetes mellitus without complications: Secondary | ICD-10-CM | POA: Insufficient documentation

## 2019-07-02 DIAGNOSIS — Z452 Encounter for adjustment and management of vascular access device: Secondary | ICD-10-CM | POA: Insufficient documentation

## 2019-07-02 DIAGNOSIS — B181 Chronic viral hepatitis B without delta-agent: Secondary | ICD-10-CM | POA: Insufficient documentation

## 2019-07-02 DIAGNOSIS — C221 Intrahepatic bile duct carcinoma: Secondary | ICD-10-CM

## 2019-07-02 DIAGNOSIS — J90 Pleural effusion, not elsewhere classified: Secondary | ICD-10-CM | POA: Insufficient documentation

## 2019-07-02 DIAGNOSIS — R634 Abnormal weight loss: Secondary | ICD-10-CM | POA: Insufficient documentation

## 2019-07-02 DIAGNOSIS — Z5189 Encounter for other specified aftercare: Secondary | ICD-10-CM | POA: Diagnosis not present

## 2019-07-02 DIAGNOSIS — Z79899 Other long term (current) drug therapy: Secondary | ICD-10-CM | POA: Insufficient documentation

## 2019-07-02 DIAGNOSIS — Z794 Long term (current) use of insulin: Secondary | ICD-10-CM | POA: Insufficient documentation

## 2019-07-02 DIAGNOSIS — Z5111 Encounter for antineoplastic chemotherapy: Secondary | ICD-10-CM | POA: Insufficient documentation

## 2019-07-02 DIAGNOSIS — Z95828 Presence of other vascular implants and grafts: Secondary | ICD-10-CM

## 2019-07-02 DIAGNOSIS — I10 Essential (primary) hypertension: Secondary | ICD-10-CM | POA: Insufficient documentation

## 2019-07-02 DIAGNOSIS — E785 Hyperlipidemia, unspecified: Secondary | ICD-10-CM | POA: Insufficient documentation

## 2019-07-02 DIAGNOSIS — I2510 Atherosclerotic heart disease of native coronary artery without angina pectoris: Secondary | ICD-10-CM | POA: Insufficient documentation

## 2019-07-02 DIAGNOSIS — N179 Acute kidney failure, unspecified: Secondary | ICD-10-CM | POA: Insufficient documentation

## 2019-07-02 LAB — CMP (CANCER CENTER ONLY)
ALT: 16 U/L (ref 0–44)
AST: 20 U/L (ref 15–41)
Albumin: 3 g/dL — ABNORMAL LOW (ref 3.5–5.0)
Alkaline Phosphatase: 52 U/L (ref 38–126)
Anion gap: 10 (ref 5–15)
BUN: 14 mg/dL (ref 8–23)
CO2: 24 mmol/L (ref 22–32)
Calcium: 8.7 mg/dL — ABNORMAL LOW (ref 8.9–10.3)
Chloride: 102 mmol/L (ref 98–111)
Creatinine: 1.22 mg/dL (ref 0.61–1.24)
GFR, Est AFR Am: >60 mL/min (ref >60)
GFR, Estimated: 59 mL/min — ABNORMAL LOW (ref >60)
Glucose, Bld: 292 mg/dL — ABNORMAL HIGH (ref 70–99)
Potassium: 4.2 mmol/L (ref 3.5–5.1)
Sodium: 136 mmol/L (ref 135–145)
Total Bilirubin: 0.3 mg/dL (ref 0.3–1.2)
Total Protein: 6.5 g/dL (ref 6.5–8.1)

## 2019-07-02 LAB — CBC WITH DIFFERENTIAL (CANCER CENTER ONLY)
Abs Immature Granulocytes: 0.02 10*3/uL (ref 0.00–0.07)
Basophils Absolute: 0 10*3/uL (ref 0.0–0.1)
Basophils Relative: 1 %
Eosinophils Absolute: 0.1 10*3/uL (ref 0.0–0.5)
Eosinophils Relative: 2 %
HCT: 32.3 % — ABNORMAL LOW (ref 39.0–52.0)
Hemoglobin: 10.4 g/dL — ABNORMAL LOW (ref 13.0–17.0)
Immature Granulocytes: 0 %
Lymphocytes Relative: 31 %
Lymphs Abs: 2.4 10*3/uL (ref 0.7–4.0)
MCH: 29.2 pg (ref 26.0–34.0)
MCHC: 32.2 g/dL (ref 30.0–36.0)
MCV: 90.7 fL (ref 80.0–100.0)
Monocytes Absolute: 0.9 10*3/uL (ref 0.1–1.0)
Monocytes Relative: 12 %
Neutro Abs: 4.2 10*3/uL (ref 1.7–7.7)
Neutrophils Relative %: 54 %
Platelet Count: 353 10*3/uL (ref 150–400)
RBC: 3.56 MIL/uL — ABNORMAL LOW (ref 4.22–5.81)
RDW: 16.4 % — ABNORMAL HIGH (ref 11.5–15.5)
WBC Count: 7.8 10*3/uL (ref 4.0–10.5)
nRBC: 0 % (ref 0.0–0.2)

## 2019-07-02 LAB — MAGNESIUM: Magnesium: 1.8 mg/dL (ref 1.7–2.4)

## 2019-07-02 MED ORDER — SODIUM CHLORIDE 0.9 % IV SOLN
Freq: Once | INTRAVENOUS | Status: AC
  Administered 2019-07-02: 12:00:00 via INTRAVENOUS
  Filled 2019-07-02: qty 5

## 2019-07-02 MED ORDER — SODIUM CHLORIDE 0.9 % IV SOLN
800.0000 mg/m2 | Freq: Once | INTRAVENOUS | Status: AC
  Administered 2019-07-02: 1444 mg via INTRAVENOUS
  Filled 2019-07-02: qty 37.98

## 2019-07-02 MED ORDER — HEPARIN SOD (PORK) LOCK FLUSH 100 UNIT/ML IV SOLN
500.0000 [IU] | Freq: Once | INTRAVENOUS | Status: AC | PRN
  Administered 2019-07-02: 500 [IU]
  Filled 2019-07-02: qty 5

## 2019-07-02 MED ORDER — SODIUM CHLORIDE 0.9% FLUSH
10.0000 mL | INTRAVENOUS | Status: DC | PRN
  Filled 2019-07-02: qty 10

## 2019-07-02 MED ORDER — SODIUM CHLORIDE 0.9 % IV SOLN
25.0000 mg/m2 | Freq: Once | INTRAVENOUS | Status: AC
  Administered 2019-07-02: 45 mg via INTRAVENOUS
  Filled 2019-07-02: qty 45

## 2019-07-02 MED ORDER — PALONOSETRON HCL INJECTION 0.25 MG/5ML
0.2500 mg | Freq: Once | INTRAVENOUS | Status: AC
  Administered 2019-07-02: 0.25 mg via INTRAVENOUS

## 2019-07-02 MED ORDER — SODIUM CHLORIDE 0.9 % IV SOLN
Freq: Once | INTRAVENOUS | Status: AC
  Administered 2019-07-02: 09:00:00 via INTRAVENOUS
  Filled 2019-07-02: qty 250

## 2019-07-02 MED ORDER — PALONOSETRON HCL INJECTION 0.25 MG/5ML
INTRAVENOUS | Status: AC
  Filled 2019-07-02: qty 5

## 2019-07-02 MED ORDER — SODIUM CHLORIDE 0.9% FLUSH
10.0000 mL | INTRAVENOUS | Status: DC | PRN
  Administered 2019-07-02: 10 mL
  Filled 2019-07-02: qty 10

## 2019-07-02 MED ORDER — SODIUM CHLORIDE 0.9 % IV SOLN
Freq: Once | INTRAVENOUS | Status: AC
  Administered 2019-07-02: 09:00:00 via INTRAVENOUS
  Filled 2019-07-02: qty 1000

## 2019-07-02 NOTE — Patient Instructions (Signed)
[facility] Discharge Instructions for Patients Receiving Chemotherapy  Today you received the following chemotherapy agents Gemzar and Cisplatin  To help prevent nausea and vomiting after your treatment, we encourage you to take your nausea medication as directed   If you develop nausea and vomiting that is not controlled by your nausea medication, call the clinic.   BELOW ARE SYMPTOMS THAT SHOULD BE REPORTED IMMEDIATELY:  *FEVER GREATER THAN 100.5 F  *CHILLS WITH OR WITHOUT FEVER  NAUSEA AND VOMITING THAT IS NOT CONTROLLED WITH YOUR NAUSEA MEDICATION  *UNUSUAL SHORTNESS OF BREATH  *UNUSUAL BRUISING OR BLEEDING  TENDERNESS IN MOUTH AND THROAT WITH OR WITHOUT PRESENCE OF ULCERS  *URINARY PROBLEMS  *BOWEL PROBLEMS  UNUSUAL RASH Items with * indicate a potential emergency and should be followed up as soon as possible.  Feel free to call the clinic should you have any questions or concerns. The clinic phone number is [phone].  Please show the CHEMO ALERT CARD at check-in to the Emergency Department and triage nurse.   

## 2019-07-03 ENCOUNTER — Telehealth: Payer: Self-pay

## 2019-07-03 ENCOUNTER — Inpatient Hospital Stay: Payer: Medicare Other

## 2019-07-03 MED ORDER — PEGFILGRASTIM-CBQV 6 MG/0.6ML ~~LOC~~ SOSY
PREFILLED_SYRINGE | SUBCUTANEOUS | Status: AC
  Filled 2019-07-03: qty 0.6

## 2019-07-03 NOTE — Telephone Encounter (Signed)
TC to Pt. To see if he forgot about injection appointment  Per Pt. Dr. Burr Medico informed him that he would not get injection today. But will resume injection next treatment.

## 2019-07-05 ENCOUNTER — Telehealth: Payer: Self-pay | Admitting: Hematology

## 2019-07-05 DIAGNOSIS — G4700 Insomnia, unspecified: Secondary | ICD-10-CM | POA: Diagnosis not present

## 2019-07-05 DIAGNOSIS — C221 Intrahepatic bile duct carcinoma: Secondary | ICD-10-CM | POA: Diagnosis not present

## 2019-07-05 DIAGNOSIS — E7800 Pure hypercholesterolemia, unspecified: Secondary | ICD-10-CM | POA: Diagnosis not present

## 2019-07-05 DIAGNOSIS — E119 Type 2 diabetes mellitus without complications: Secondary | ICD-10-CM | POA: Diagnosis not present

## 2019-07-05 DIAGNOSIS — I10 Essential (primary) hypertension: Secondary | ICD-10-CM | POA: Diagnosis not present

## 2019-07-05 NOTE — Telephone Encounter (Signed)
Scheduled appt per 10/2 los.  Sent a staff message for an add-on.  Will contact patient once treatment has been added with his appt dates and time.

## 2019-07-06 ENCOUNTER — Telehealth: Payer: Self-pay

## 2019-07-06 NOTE — Telephone Encounter (Signed)
Patient left voicemail stating he had an appointment with his PCP and she wanted Dr. Burr Medico to sign off on patient receiving flu vaccine since patient is undergoing active treatment.   Called patient back and left a voicemail letting him know that Dr. Burr Medico was comfortable with patient receiving flu vaccine, and that it could be administered at his next appointment 07/15/19 if he was comfortable waiting until then. Advised him to call our clinic back if he had any other questions/concerns.   Updated appointment notes to advise infusion RN to administered flu vaccine during infusion appointment.

## 2019-07-07 ENCOUNTER — Telehealth: Payer: Self-pay

## 2019-07-07 DIAGNOSIS — Z23 Encounter for immunization: Secondary | ICD-10-CM | POA: Diagnosis not present

## 2019-07-07 NOTE — Telephone Encounter (Signed)
Patient left a voicemail stating he met with Dr. Carlis Abbott at [facility] on 07/05/19 and was told that his CT on 07/28/19 "needed to be changed completely, the way they want to do it is not how Dr. Carlis Abbott wants it done".   Returned patient's call to clarify, and was informed that Dr. Carlis Abbott didn't want patient to ingest the oral contrast, he only wanted the IV dye injected. Patient also mentioned that he didn't have a flush appointment before his CT scan and was worried the CT would have to be moved to accommidate a flush appointment. Reassured patient that there would be time to access his port before his CT scan (either by a flush nurse or one of the early morning infusion RNs). I also told him that if Dr. Carlis Abbott didn't want patient to have the oral contrast that could be easily accommodated. Patient verbalized understanding and agreement and verified that he would go over everything again with Dr. Burr Medico at his next appointment on 07/15/19.  Called radiology department to inform them that patient would not need the oral contrast and updated appointment note to reflect.

## 2019-07-08 ENCOUNTER — Other Ambulatory Visit: Payer: Medicare Other

## 2019-07-08 ENCOUNTER — Ambulatory Visit: Payer: Medicare Other

## 2019-07-08 ENCOUNTER — Ambulatory Visit: Payer: Medicare Other | Admitting: Nurse Practitioner

## 2019-07-11 ENCOUNTER — Encounter: Payer: Self-pay | Admitting: Hematology

## 2019-07-13 ENCOUNTER — Telehealth: Payer: Self-pay

## 2019-07-13 ENCOUNTER — Telehealth: Payer: Self-pay | Admitting: Hematology

## 2019-07-13 NOTE — Telephone Encounter (Signed)
R/s appt per 10/12 sch message- called pt - unable to reach pt or leave message- message sent to Rn to let them know

## 2019-07-13 NOTE — Telephone Encounter (Signed)
Attempted to reach patient two times with phone ringing and no voice mail option regarding his appointments which have been rescheduled to 07/16/2019.  I have sent this patient a My Chart message regarding when his appointments are scheduled (as he uses this option for communication).

## 2019-07-14 NOTE — Progress Notes (Signed)
[place]   Telephone:([phone]) [phone] Fax:([phone]) [phone]   Clinic Follow up Note   Patient Care Team: [name], DO as PCP - General (Family Medicine)  Date of Service:  07/16/2019  CHIEF COMPLAINT: F/u of ExtrahepaticCholangiocarcinoma  SUMMARY OF ONCOLOGIC HISTORY: Oncology History  Cholangiocarcinoma ([place])  01/14/2019 Imaging   US Abdomen 01/14/19  IMPRESSION: There is significant intrahepatic and extrahepatic biliary dilatation present, concerning for distal common bile duct obstruction. Further evaluation with CT scan or MRCP is recommended. Correlation with liver function tests is recommended as well. These results will be called to the ordering clinician or representative by the Radiologist Assistant, and communication documented in the PACS or zVision Dashboard.   Probable large amount of sludge seen within gallbladder lumen with mild gallbladder wall thickening. However, no cholelithiasis, pericholecystic fluid or sonographic Murphy's sign is noted.   4.2 cm septated cyst seen in upper pole of right kidney consistent with Bosniak type 2 lesion. Follow-up ultrasound in 1 year is recommended to ensure stability.   01/20/2019 Imaging   MRI abdomen 01/20/19  IMPRESSION: 1. Findings are highly concerning for central tumor in the biliary tract at the confluence of the common hepatic duct, cystic duct and common bile ducts. Further clinical evaluation for potential cholangiocarcinoma is strongly recommended. 2. Mild ductal dilatation of the main pancreatic duct throughout the distal body and tail of the pancreas where there is also some associated side branch ectasia. This may suggest a pancreatic ductal stricture. No obstructing pancreatic neoplasm identified. 3. Aortic atherosclerosis.   01/21/2019 Initial Biopsy   Diagnosis 01/21/19  BILE DUCT BRUSHING (SPECIMEN 1 OF 1 COLLECTED 01/21/2019) ADENOCARCINOMA.   01/21/2019 Procedure   ERCP By Dr hung  01/21/19 IMRPESSION - The major papilla appeared normal. - A single localized biliary stricture was found in the middle third of the main bile duct. - The entire main bile duct and upper third of the main bile duct were dilated, secondary to a stricture. - A biliary sphincterotomy was performed. - Cells for cytology obtained in the middle third of the main bile duct. - One plastic stent was placed into the common bile duct.  EUS by Dr hung 01/21/19  IMPRESSION - There was dilation in the middle third of the main bile duct and in the upper third of the main bile duct which measured up to 15 mm. - There was a suggestion of a stricture in the middle third of the main bile duct. - No specimens collected.   02/03/2019 Imaging   CT CAP at [facility] 02/03/19  Impression:  1. There is mild intrahepatic and extrahepatic biliary ductal dilation and diffuse common bile duct wall thickening with stent in place. There is abnormal soft tissue measuring approximately 1.6 cm between the common hepatic artery, portal vein and common bile duct, worrisome for the known cholangiocarcinoma.  2. Periportal lymphadenopathy which may represent nodal metastasis.  4. Marked pancreatic atrophy and duct dilation involving the distal body and tail the pancreas where there is a coarse calcification. These findings are favored to represent stenosis from intraductal stones though an underlying stricture cannot be completely excluded.  5. Atypical symmetric prominent fat stranding of the bilateral lower abdominal wall. Correlate with surgical history or history of history of trauma.   03/08/2019 Initial Diagnosis   Cholangiocarcinoma ([place])   03/12/2019 -  Chemotherapy   gemcitabine and cisplatin on days 1 and 8 every 21 days starting 03/12/19. Abraxane added with cycle 2. Added GCSF [name]) to  day 9 starting cycle 2.    03/19/2019 Cancer Staging   Staging form: Perihilar Bile Ducts, AJCC 8th Edition - Clinical:  Stage Unknown (cTX, cN0, cM0) - Signed by [name], MD on 03/19/2019   05/24/2019 Imaging   CT [facility] W [facility]  1.  Interval placement of covered metallic biliary stent. No progressive dilation of the biliary tree and resulting resolution of the prior main pancreatic duct dilation. 2.  Evidence of some subtle vascular contour deformity involving the hepatic artery, suspected to be from tumoral contact and/or posttreatment changes. 3.  New small right pleural effusion with overlying mild airspace disease. Secondary findings which may indicate a component of aspiration. 4.  No convincing evidence of new disseminated metastatic disease. 5.  Additional findings as discussed above.      CURRENT THERAPY:  gemcitabine and cisplatin on days 1 and 8 every 21 daysstarting 03/12/19. Abraxane added with cycle 2.AddedGCSF ([name])to day 9 startingcycle 2. Chemo held since 06/03/19 due to poor toleration. Chemo restarted with Gem/Cis with C6 on 07/02/19.   INTERVAL HISTORY:  Christopher Welch is here for a follow up and treatment. He presents to the clinic alone. He notes he is eating better and able to gain weight. He is able to walk around the store without significant trouble breathing.  He will see Dr. Carlis Abbott on 11/2. It took his 2 days to feel better breathing after drainage. He notes he tolerating last cycle chemo well.    REVIEW OF SYSTEMS:   Constitutional: Denies fevers, chills or abnormal weight loss (+) Improved eating and weight gain  Eyes: Denies blurriness of vision Ears, nose, mouth, throat, and face: Denies mucositis or sore throat Respiratory: Denies cough, dyspnea or wheezes (+) Improved breathing  Cardiovascular: Denies palpitation, chest discomfort or lower extremity swelling Gastrointestinal:  Denies nausea, heartburn or change in bowel habits Skin: Denies abnormal skin rashes Lymphatics: Denies new lymphadenopathy or easy bruising Neurological:Denies numbness, tingling or  new weaknesses Behavioral/Psych: Mood is stable, no new changes  All other systems were reviewed with the patient and are negative.  MEDICAL HISTORY:  Past Medical History:  Diagnosis Date  . Diabetes mellitus type I, controlled ([place])   . Dyslipidemia   . ED (erectile dysfunction)   . Essential hypertension   . Gilbert's disease   . Hepatitis B carrier [facility])     SURGICAL HISTORY: Past Surgical History:  Procedure Laterality Date  . APPENDECTOMY  1964  . BILIARY BRUSHING  01/21/2019   Procedure: BILIARY BRUSHING;  Surgeon: Carol Ada, MD;  Location: [facility];  Service: Endoscopy;;  . BILIARY STENT PLACEMENT N/A 01/21/2019   Procedure: BILIARY STENT PLACEMENT;  Surgeon: Carol Ada, MD;  Location: [facility];  Service: Endoscopy;  Laterality: N/A;  . ENDOSCOPIC RETROGRADE CHOLANGIOPANCREATOGRAPHY (ERCP) WITH PROPOFOL N/A 01/21/2019   Procedure: ENDOSCOPIC RETROGRADE CHOLANGIOPANCREATOGRAPHY (ERCP) WITH PROPOFOL;  Surgeon: Carol Ada, MD;  Location: [facility];  Service: Endoscopy;  Laterality: N/A;  . ESOPHAGOGASTRODUODENOSCOPY (EGD) WITH PROPOFOL N/A 01/21/2019   Procedure: ESOPHAGOGASTRODUODENOSCOPY (EGD) WITH PROPOFOL;  Surgeon: Carol Ada, MD;  Location: [facility];  Service: Endoscopy;  Laterality: N/A;  . FEMORAL HERNIA REPAIR    . IR CHOLANGIOGRAM EXISTING TUBE  05/13/2019  . IR CHOLANGIOGRAM EXISTING TUBE  06/08/2019  . IR EXCHANGE BILIARY DRAIN  05/18/2019  . IR EXCHANGE BILIARY DRAIN  06/08/2019  . IR IMAGING GUIDED PORT INSERTION  03/08/2019  . IR PERC CHOLECYSTOSTOMY  05/04/2019  . LEFT HEART CATH AND CORONARY  ANGIOGRAPHY N/A 05/19/2017   Procedure: LEFT HEART CATH AND CORONARY ANGIOGRAPHY;  Surgeon: Leonie Man, MD;  Location: [place] [facility];  Service: Cardiovascular;  Laterality: N/A;  . SPHINCTEROTOMY  01/21/2019   Procedure: SPHINCTEROTOMY;  Surgeon: Carol Ada, MD;  Location: [facility];  Service: Endoscopy;;  . UPPER ESOPHAGEAL ENDOSCOPIC  ULTRASOUND (EUS) N/A 01/21/2019   Procedure: UPPER ESOPHAGEAL ENDOSCOPIC ULTRASOUND (EUS);  Surgeon: Carol Ada, MD;  Location: [facility];  Service: Endoscopy;  Laterality: N/A;    I have reviewed the social history and family history with the patient and they are unchanged from previous note.  ALLERGIES:  is allergic to erythromycin.  MEDICATIONS:  Current Outpatient Medications  Medication Sig Dispense Refill  . acetaminophen (TYLENOL) 500 MG tablet Take 1,000 mg by mouth 2 (two) times daily as needed for moderate pain or headache.    Marland Kitchen amLODipine (NORVASC) 5 MG tablet Take 5 mg by mouth daily.     Marland Kitchen atenolol (TENORMIN) 25 MG tablet Take 25 mg by mouth daily. Once a day     . atorvastatin (LIPITOR) 10 MG tablet Take 10 mg by mouth daily.    . Blood Glucose Monitoring Suppl (ACCU-CHEK NANO SMARTVIEW) W/DEVICE KIT 1 kit by Does not apply route 2 (two) times daily. (Patient taking differently: 1 kit by Does not apply route See admin instructions. Test blood sugars 12x's daily) 1 kit 0  . glucose blood test strip Test 3 times a day. (Patient taking differently: 1 each by Other route See admin instructions. Test 12 times a day.) 300 each Prn  . insulin NPH Human (NOVOLIN N) 100 UNIT/ML injection Inject 20 Units into the skin at bedtime.     . insulin regular (NOVOLIN R) 100 units/mL injection Inject 20 Units into the skin 3 (three) times daily before meals.     . lidocaine-prilocaine (EMLA) cream Apply to affected area once 30 g 3  . lisinopril (ZESTRIL) 20 MG tablet Take 20 mg by mouth daily.     . magnesium oxide (MAG-OX) 400 MG tablet TAKE 1 TABLET BY MOUTH EVERY DAY (Patient taking differently: Take 400 mg by mouth daily. ) 90 tablet 0  . metFORMIN (GLUCOPHAGE) 1000 MG tablet Take 2,000 mg by mouth every evening.     . ondansetron (ZOFRAN) 8 MG tablet TAKE 1 TABLET BY MOUTH 2 TIMES DAY AS NEEDED. START ON 3RD DAY AFTER CHEMOTHERAPY (Patient taking differently: Take 8 mg by mouth 2 (two)  times daily as needed. Start on 3rd day after chemotherapy) 30 tablet 1  . oxycodone (OXY-IR) 5 MG capsule Take 1 capsule (5 mg total) by mouth every 6 (six) hours as needed for pain. 60 capsule 0  . prochlorperazine (COMPAZINE) 10 MG tablet TAKE 1 TABLET (10 MG TOTAL) BY MOUTH EVERY 6 (SIX) HOURS AS NEEDED (NAUSEA OR VOMITING). (Patient taking differently: Take 10 mg by mouth every 6 (six) hours as needed for nausea or vomiting (Nausea or vomiting). ) 30 tablet 1  . zolpidem (AMBIEN) 5 MG tablet Take 1-2 tablets (5-10 mg total) by mouth at bedtime as needed for sleep. 60 tablet 0   No current facility-administered medications for this visit.    Facility-Administered Medications Ordered in Other Visits  Medication Dose Route Frequency Provider Last Rate Last Dose  . heparin lock flush 100 unit/mL  500 Units Intracatheter Once PRN [name], MD      . sodium chloride flush (NS) 0.9 % injection 10 mL  10 mL Intracatheter  PRN [name], MD        PHYSICAL EXAMINATION: ECOG PERFORMANCE STATUS: 1 - Symptomatic but completely ambulatory  Vitals with BMI 07/16/2019  Height 5'7"  Weight 146 lbs 2 oz  BMI 44.31  Systolic 540  Diastolic 85  Pulse 73    GENERAL:alert, no distress and comfortable SKIN: skin color, texture, turgor are normal, no rashes or significant lesions EYES: normal, Conjunctiva are pink and non-injected, sclera clear  NECK: supple, thyroid normal size, non-tender, without nodularity LYMPH:  no palpable lymphadenopathy in the cervical, axillary  LUNGS: clear to auscultation and percussion with normal breathing effort (+) less than 1/3 Pleural effusion  HEART: regular rate & rhythm and no murmurs and no lower extremity edema ABDOMEN:abdomen soft, non-tender and normal bowel sounds Musculoskeletal:no cyanosis of digits and no clubbing  NEURO: alert & oriented x 3 with fluent speech, no focal motor/sensory deficits  LABORATORY DATA:  I have reviewed the data as listed CBC  Latest Ref Rng & Units 07/16/2019 07/02/2019 06/24/2019  WBC 4.0 - 10.5 K/uL 6.2 7.8 11.2(H)  Hemoglobin 13.0 - 17.0 g/dL 10.3(L) 10.4(L) 10.5(L)  Hematocrit 39.0 - 52.0 % 32.3(L) 32.3(L) 32.9(L)  Platelets 150 - 400 K/uL 256 353 636(H)     CMP Latest Ref Rng & Units 07/16/2019 07/02/2019 06/24/2019  Glucose 70 - 99 mg/dL 200(H) 292(H) 240(H)  BUN 8 - 23 mg/dL _0 Creatinine 0.61 - 1.24 mg/dL 1.19 1.22 1.33(H)  Sodium 135 - 145 mmol/L 137 136 135  Potassium 3.5 - 5.1 mmol/L 4.1 4.2 4.7  Chloride 98 - 111 mmol/L 102 102 100  CO2 22 - 32 mmol/L _1 Calcium 8.9 - 10.3 mg/dL 9.1 8.7(L) 8.9  Total Protein 6.5 - 8.1 g/dL 6.8 6.5 6.4(L)  Total Bilirubin 0.3 - 1.2 mg/dL 0.4 0.3 0.3  Alkaline Phos 38 - 126 U/L 50 52 60  AST 15 - 41 U/L 13(L) 20 14(L)  ALT 0 - 44 U/L _2 RADIOGRAPHIC STUDIES: I have personally reviewed the radiological images as listed and agreed with the findings in the report. No results found.   ASSESSMENT & PLAN:  Christopher Welch is a 73 y.o. male with   1. Extrahepatic cholangiocarcinoma, cTxN0M0 -He wasdiagnosedin 12/2018. His work up revealed malignant stricture and CBD obstruction required stenting. Brushing revealed adenocarcinoma. His extrahepatic cholangiocarcinoma was not resectable due to vascular invasion(surgeon - Dr. Zenia Resides at [facility] started first line chemogemcitabine and cisplatin on days 1 and 8 every 21 dayson6/12/20. Abraxane added with cycle 2.AddedGCSF ([name])to day 9 startingcycle 2. -[facility] CT [facility] showsstable disease, Dr. Carlis Abbott recommended continue chemo -Due to significant nausea, weight loss and poor toleration which required hospitalization, chemo was held since 06/03/19-07/01/09. He has much improved and doing better.  -He lately also developed right pleural effusion, cytology was negative -Labs reviewed, CBC and CMP WNL except HG 10.3, BG 200, albumin 3.3, AST 13. Mag normal. CA 19.9 still  pending. Overall adequate to proceed with Gem/Cis today at dose reduction today and next week. Will continue to hold Abraxane for now. -Next scan week of 10/28, he will see Dr. Carlis Abbott on 11/2 -F/u next week  2.History of acute cholecystitis -He washospitalizedon 05/03/19.  -He had a percutaneous gallbladder drainage tube placed by IR8/5/20.It was removed on 9/8 due to leakage  -Hiscurrent abdominal pain is probably related to the procedure, I encouraged him to use pain medication as  needed -No clinical high concern for acute cholecystitis we will continue to monitoring clinically  3.New right-sided pleural effusion -He underwent right thoracentesis yesterday with 2.1 is removed on 06/23/2019, cytology negative.  The etiology is unclear, certainly malignancy is a high possibility. -He received a course of antibiotics  -His breathing has been improving lately. Only has very mild SOB upon exertion.  Exam today showed mild to moderate right pleural effusion, stable, no need for thoracentesis currently (07/16/19)  4. Weight lossand low appetite -secondary to malignancyand chemo -he presented with 20 lbs weight loss  -We reviewed nutrition support withGlucerna -Continue to f/u with dietician -His appetitehas dropped significantly with recent cholecystitis and secondary to chemo due to nausea.  -When off treatment he has no nausea and has been able to eat more. I encouraged him to continue working on eating more to gain weight back.  -I encouraged him to continuenutritional supplement (carnation breakfast and Boost)to 3-5 bottles aday. -With chemo dose reduction (no Abraxane) and improved breathing he has been eating better and able to gain weight.   5. DM -managed by PCP Dr. Theda Sers -he has had DM for 20 years, he is compliant with regimen and knows how to adjust BG -on insulin, he knows how to adjust dose based on his BG level, but overall not well controlled, I encourage  him to f/u with Dr. Theda Sers -Due to elevated BG secondary to chemo pre-meds, he has been needing to increase his insulin but working on controlling it.  6. CAD, HL, HTN -followed by cardiologist Dr. Ellyn Hack -on amlodipine, atenolol, lisinopril, and statin -controlled and stable   7. AKI -Cr previously 1.33 on 06/24/19. He was given IV Fluids that day.  -Cr normal recently   PLAN: -Labs reviewed and adequate to proceed with Gem/Cis today at dose reduction of gemcitabine to 814m/m2 -Labs flush, f/u and chemo Cis/Gem next week  -CT CAP W Contrast (no oral contrast) on 10/28, he will see Dr. CCarlis Abbotton 10/2 -f/u in 3 weeks before next cycle chemo    No problem-specific Assessment & Plan notes found for this encounter.   No orders of the defined types were placed in this encounter.  All questions were answered. The patient knows to call the clinic with any problems, questions or concerns. No barriers to learning was detected. I spent 20 minutes counseling the patient face to face. The total time spent in the appointment was 25 minutes and more than 50% was on counseling and review of test results     YTruitt Merle MD 07/16/2019   I, AJoslyn Devon am acting as scribe for YTruitt Merle MD.   I have reviewed the above documentation for accuracy and completeness, and I agree with the above.

## 2019-07-15 ENCOUNTER — Telehealth: Payer: Self-pay | Admitting: Hematology

## 2019-07-15 ENCOUNTER — Other Ambulatory Visit: Payer: Medicare Other

## 2019-07-15 ENCOUNTER — Ambulatory Visit: Payer: Medicare Other

## 2019-07-15 ENCOUNTER — Ambulatory Visit: Payer: Medicare Other | Admitting: Hematology

## 2019-07-15 NOTE — Telephone Encounter (Signed)
Returned patient's phone call regarding rescheduling an appointment, per patient's request 10/18 port flush appointment time has been rescheduled.

## 2019-07-16 ENCOUNTER — Other Ambulatory Visit: Payer: Self-pay

## 2019-07-16 ENCOUNTER — Inpatient Hospital Stay: Payer: Medicare Other

## 2019-07-16 ENCOUNTER — Inpatient Hospital Stay (HOSPITAL_BASED_OUTPATIENT_CLINIC_OR_DEPARTMENT_OTHER): Payer: Medicare Other | Admitting: Hematology

## 2019-07-16 ENCOUNTER — Encounter: Payer: Self-pay | Admitting: Hematology

## 2019-07-16 VITALS — BP 148/85 | HR 73 | Temp 98.8°F | Resp 20 | Wt 146.1 lb

## 2019-07-16 DIAGNOSIS — Z95828 Presence of other vascular implants and grafts: Secondary | ICD-10-CM

## 2019-07-16 DIAGNOSIS — C221 Intrahepatic bile duct carcinoma: Secondary | ICD-10-CM

## 2019-07-16 DIAGNOSIS — Z794 Long term (current) use of insulin: Secondary | ICD-10-CM | POA: Diagnosis not present

## 2019-07-16 DIAGNOSIS — Z452 Encounter for adjustment and management of vascular access device: Secondary | ICD-10-CM | POA: Diagnosis not present

## 2019-07-16 DIAGNOSIS — J90 Pleural effusion, not elsewhere classified: Secondary | ICD-10-CM | POA: Diagnosis not present

## 2019-07-16 DIAGNOSIS — E1140 Type 2 diabetes mellitus with diabetic neuropathy, unspecified: Secondary | ICD-10-CM

## 2019-07-16 DIAGNOSIS — E785 Hyperlipidemia, unspecified: Secondary | ICD-10-CM | POA: Diagnosis not present

## 2019-07-16 DIAGNOSIS — I10 Essential (primary) hypertension: Secondary | ICD-10-CM | POA: Diagnosis not present

## 2019-07-16 DIAGNOSIS — Z5111 Encounter for antineoplastic chemotherapy: Secondary | ICD-10-CM | POA: Diagnosis not present

## 2019-07-16 LAB — CBC WITH DIFFERENTIAL (CANCER CENTER ONLY)
Abs Immature Granulocytes: 0.01 10*3/uL (ref 0.00–0.07)
Basophils Absolute: 0 10*3/uL (ref 0.0–0.1)
Basophils Relative: 0 %
Eosinophils Absolute: 0.1 10*3/uL (ref 0.0–0.5)
Eosinophils Relative: 1 %
HCT: 32.3 % — ABNORMAL LOW (ref 39.0–52.0)
Hemoglobin: 10.3 g/dL — ABNORMAL LOW (ref 13.0–17.0)
Immature Granulocytes: 0 %
Lymphocytes Relative: 37 %
Lymphs Abs: 2.3 10*3/uL (ref 0.7–4.0)
MCH: 29.1 pg (ref 26.0–34.0)
MCHC: 31.9 g/dL (ref 30.0–36.0)
MCV: 91.2 fL (ref 80.0–100.0)
Monocytes Absolute: 0.9 10*3/uL (ref 0.1–1.0)
Monocytes Relative: 15 %
Neutro Abs: 2.9 10*3/uL (ref 1.7–7.7)
Neutrophils Relative %: 47 %
Platelet Count: 256 10*3/uL (ref 150–400)
RBC: 3.54 MIL/uL — ABNORMAL LOW (ref 4.22–5.81)
RDW: 16 % — ABNORMAL HIGH (ref 11.5–15.5)
WBC Count: 6.2 10*3/uL (ref 4.0–10.5)
nRBC: 0 % (ref 0.0–0.2)

## 2019-07-16 LAB — MAGNESIUM: Magnesium: 1.9 mg/dL (ref 1.7–2.4)

## 2019-07-16 LAB — CMP (CANCER CENTER ONLY)
ALT: 11 U/L (ref 0–44)
AST: 13 U/L — ABNORMAL LOW (ref 15–41)
Albumin: 3.3 g/dL — ABNORMAL LOW (ref 3.5–5.0)
Alkaline Phosphatase: 50 U/L (ref 38–126)
Anion gap: 9 (ref 5–15)
BUN: 15 mg/dL (ref 8–23)
CO2: 26 mmol/L (ref 22–32)
Calcium: 9.1 mg/dL (ref 8.9–10.3)
Chloride: 102 mmol/L (ref 98–111)
Creatinine: 1.19 mg/dL (ref 0.61–1.24)
GFR, Est AFR Am: >60 mL/min (ref >60)
GFR, Estimated: >60 mL/min (ref >60)
Glucose, Bld: 200 mg/dL — ABNORMAL HIGH (ref 70–99)
Potassium: 4.1 mmol/L (ref 3.5–5.1)
Sodium: 137 mmol/L (ref 135–145)
Total Bilirubin: 0.4 mg/dL (ref 0.3–1.2)
Total Protein: 6.8 g/dL (ref 6.5–8.1)

## 2019-07-16 MED ORDER — PALONOSETRON HCL INJECTION 0.25 MG/5ML
0.2500 mg | Freq: Once | INTRAVENOUS | Status: AC
  Administered 2019-07-16: 0.25 mg via INTRAVENOUS

## 2019-07-16 MED ORDER — SODIUM CHLORIDE 0.9 % IV SOLN
25.0000 mg/m2 | Freq: Once | INTRAVENOUS | Status: AC
  Administered 2019-07-16: 45 mg via INTRAVENOUS
  Filled 2019-07-16: qty 45

## 2019-07-16 MED ORDER — PALONOSETRON HCL INJECTION 0.25 MG/5ML
INTRAVENOUS | Status: AC
  Filled 2019-07-16: qty 5

## 2019-07-16 MED ORDER — SODIUM CHLORIDE 0.9% FLUSH
10.0000 mL | INTRAVENOUS | Status: DC | PRN
  Administered 2019-07-16: 10 mL
  Filled 2019-07-16: qty 10

## 2019-07-16 MED ORDER — SODIUM CHLORIDE 0.9 % IV SOLN
Freq: Once | INTRAVENOUS | Status: AC
  Administered 2019-07-16: 10:00:00 via INTRAVENOUS
  Filled 2019-07-16: qty 250

## 2019-07-16 MED ORDER — SODIUM CHLORIDE 0.9% FLUSH
10.0000 mL | INTRAVENOUS | Status: DC | PRN
  Administered 2019-07-16: 09:00:00 10 mL
  Filled 2019-07-16: qty 10

## 2019-07-16 MED ORDER — SODIUM CHLORIDE 0.9 % IV SOLN
800.0000 mg/m2 | Freq: Once | INTRAVENOUS | Status: AC
  Administered 2019-07-16: 1444 mg via INTRAVENOUS
  Filled 2019-07-16: qty 37.98

## 2019-07-16 MED ORDER — SODIUM CHLORIDE 0.9 % IV SOLN
Freq: Once | INTRAVENOUS | Status: AC
  Administered 2019-07-16: 13:00:00 via INTRAVENOUS
  Filled 2019-07-16: qty 5

## 2019-07-16 MED ORDER — HEPARIN SOD (PORK) LOCK FLUSH 100 UNIT/ML IV SOLN
500.0000 [IU] | Freq: Once | INTRAVENOUS | Status: AC | PRN
  Administered 2019-07-16: 500 [IU]
  Filled 2019-07-16: qty 5

## 2019-07-16 MED ORDER — SODIUM CHLORIDE 0.9 % IV SOLN
Freq: Once | INTRAVENOUS | Status: AC
  Administered 2019-07-16: 11:00:00 via INTRAVENOUS
  Filled 2019-07-16: qty 1000

## 2019-07-16 NOTE — Patient Instructions (Signed)
[place] Discharge Instructions for Patients Receiving Chemotherapy  Today you received the following chemotherapy agents Cisplatin and Gemcitabine  To help prevent nausea and vomiting after your treatment, we encourage you to take your nausea medication as directed by your MD.   If you develop nausea and vomiting that is not controlled by your nausea medication, call the clinic.   BELOW ARE SYMPTOMS THAT SHOULD BE REPORTED IMMEDIATELY:  *FEVER GREATER THAN 100.5 F  *CHILLS WITH OR WITHOUT FEVER  NAUSEA AND VOMITING THAT IS NOT CONTROLLED WITH YOUR NAUSEA MEDICATION  *UNUSUAL SHORTNESS OF BREATH  *UNUSUAL BRUISING OR BLEEDING  TENDERNESS IN MOUTH AND THROAT WITH OR WITHOUT PRESENCE OF ULCERS  *URINARY PROBLEMS  *BOWEL PROBLEMS  UNUSUAL RASH Items with * indicate a potential emergency and should be followed up as soon as possible.  Feel free to call the clinic should you have any questions or concerns. The clinic phone number is [phone].  Please show the [place] at check-in to the Emergency Department and triage nurse. Coronavirus (COVID-19) Are you at risk?  Are you at risk for the Coronavirus (COVID-19)?  To be considered HIGH RISK for Coronavirus (COVID-19), you have to meet the following criteria:  . Traveled to [place], [place], [place], [place] or [place]; or in the [place] to [place], [place], [place], or [place]; and have fever, cough, and shortness of breath within the last 2 weeks of travel OR . Been in close contact with a person diagnosed with COVID-19 within the last 2 weeks and have fever, cough, and shortness of breath . IF YOU DO NOT MEET THESE CRITERIA, YOU ARE CONSIDERED LOW RISK FOR COVID-19.  What to do if you are HIGH RISK for COVID-19?  Marland Kitchen If you are having a medical emergency, call 911. . Seek medical care right away. Before you go to a doctor's office, urgent care or emergency department, call ahead and  tell them about your recent travel, contact with someone diagnosed with COVID-19, and your symptoms. You should receive instructions from your physician's office regarding next steps of care.  . When you arrive at healthcare provider, tell the healthcare staff immediately you have returned from visiting [place], [place], [place], [place] or [place]; or traveled in the [place] to [place], [place], [place], or [place]; in the last two weeks or you have been in close contact with a person diagnosed with COVID-19 in the last 2 weeks.   . Tell the health care staff about your symptoms: fever, cough and shortness of breath. . After you have been seen by a medical provider, you will be either: o Tested for (COVID-19) and discharged home on quarantine except to seek medical care if symptoms worsen, and asked to  - Stay home and avoid contact with others until you get your results (4-5 days)  - Avoid travel on public transportation if possible (such as bus, train, or airplane) or o Sent to the Emergency Department by EMS for evaluation, COVID-19 testing, and possible admission depending on your condition and test results.  What to do if you are LOW RISK for COVID-19?  Reduce your risk of any infection by using the same precautions used for avoiding the common cold or flu:  Marland Kitchen Wash your hands often with soap and warm water for at least 20 seconds.  If soap and water are not readily available, use an alcohol-based hand sanitizer with at least 60% alcohol.  Marland Kitchen  If coughing or sneezing, cover your mouth and nose by coughing or sneezing into the elbow areas of your shirt or coat, into a tissue or into your sleeve (not your hands). . Avoid shaking hands with others and consider head nods or verbal greetings only. . Avoid touching your eyes, nose, or mouth with unwashed hands.  . Avoid close contact with people who are sick. . Avoid places or events with large numbers of people in one location, like  concerts or sporting events. . Carefully consider travel plans you have or are making. . If you are planning any travel outside or inside the [place], visit the CDC's Travelers' Health webpage for the latest health notices. . If you have some symptoms but not all symptoms, continue to monitor at home and seek medical attention if your symptoms worsen. . If you are having a medical emergency, call 911.   [place] / e-Visit: [email or web address]         [facility]: [facility]: [phone]                   [facility]: [phone]

## 2019-07-16 NOTE — Progress Notes (Signed)
Per pt. had current flu vaccine for this season, no injection needed. Per pharmacy, ok to continue with order of chemo administration as Cisplatin first followed by Gemcitabine.

## 2019-07-17 LAB — CANCER ANTIGEN 19-9: CA 19-9: 29 U/mL (ref 0–35)

## 2019-07-18 ENCOUNTER — Encounter: Payer: Self-pay | Admitting: Hematology

## 2019-07-19 ENCOUNTER — Encounter: Payer: Self-pay | Admitting: Hematology

## 2019-07-19 ENCOUNTER — Telehealth: Payer: Self-pay | Admitting: Hematology

## 2019-07-19 NOTE — Telephone Encounter (Signed)
Scheduled appt per 10/16 los.  Patient will get a print out at his next scheduled appt.

## 2019-07-20 ENCOUNTER — Telehealth: Payer: Self-pay

## 2019-07-20 NOTE — Telephone Encounter (Signed)
-----   Message from Alla Feeling, NP sent at 07/19/2019 12:32 PM EDT ----- Please let him know CA 19-9 improved on chemo and remains in normal range which is good news.  Thanks, Regan Rakers

## 2019-07-20 NOTE — Telephone Encounter (Signed)
TC to Pt to inform him of lab results no answering machine to leave a message

## 2019-07-22 ENCOUNTER — Inpatient Hospital Stay: Payer: Medicare Other

## 2019-07-22 ENCOUNTER — Other Ambulatory Visit: Payer: Self-pay | Admitting: Hematology

## 2019-07-22 ENCOUNTER — Encounter: Payer: Self-pay | Admitting: Hematology

## 2019-07-22 ENCOUNTER — Other Ambulatory Visit: Payer: Self-pay

## 2019-07-22 VITALS — BP 154/81 | HR 77 | Temp 98.0°F | Resp 20 | Wt 147.2 lb

## 2019-07-22 DIAGNOSIS — C221 Intrahepatic bile duct carcinoma: Secondary | ICD-10-CM

## 2019-07-22 DIAGNOSIS — E785 Hyperlipidemia, unspecified: Secondary | ICD-10-CM | POA: Diagnosis not present

## 2019-07-22 DIAGNOSIS — I10 Essential (primary) hypertension: Secondary | ICD-10-CM | POA: Diagnosis not present

## 2019-07-22 DIAGNOSIS — Z5111 Encounter for antineoplastic chemotherapy: Secondary | ICD-10-CM | POA: Diagnosis not present

## 2019-07-22 DIAGNOSIS — Z452 Encounter for adjustment and management of vascular access device: Secondary | ICD-10-CM | POA: Diagnosis not present

## 2019-07-22 DIAGNOSIS — J90 Pleural effusion, not elsewhere classified: Secondary | ICD-10-CM | POA: Diagnosis not present

## 2019-07-22 LAB — CMP (CANCER CENTER ONLY)
ALT: 12 U/L (ref 0–44)
AST: 13 U/L — ABNORMAL LOW (ref 15–41)
Albumin: 3.3 g/dL — ABNORMAL LOW (ref 3.5–5.0)
Alkaline Phosphatase: 48 U/L (ref 38–126)
Anion gap: 11 (ref 5–15)
BUN: 18 mg/dL (ref 8–23)
CO2: 24 mmol/L (ref 22–32)
Calcium: 8.8 mg/dL — ABNORMAL LOW (ref 8.9–10.3)
Chloride: 102 mmol/L (ref 98–111)
Creatinine: 1.17 mg/dL (ref 0.61–1.24)
GFR, Est AFR Am: >60 mL/min
GFR, Estimated: >60 mL/min
Glucose, Bld: 224 mg/dL — ABNORMAL HIGH (ref 70–99)
Potassium: 4.3 mmol/L (ref 3.5–5.1)
Sodium: 137 mmol/L (ref 135–145)
Total Bilirubin: 0.4 mg/dL (ref 0.3–1.2)
Total Protein: 6.7 g/dL (ref 6.5–8.1)

## 2019-07-22 LAB — CBC WITH DIFFERENTIAL (CANCER CENTER ONLY)
Abs Immature Granulocytes: 0.01 10*3/uL (ref 0.00–0.07)
Basophils Absolute: 0 10*3/uL (ref 0.0–0.1)
Basophils Relative: 1 %
Eosinophils Absolute: 0 10*3/uL (ref 0.0–0.5)
Eosinophils Relative: 1 %
HCT: 30.2 % — ABNORMAL LOW (ref 39.0–52.0)
Hemoglobin: 9.8 g/dL — ABNORMAL LOW (ref 13.0–17.0)
Immature Granulocytes: 0 %
Lymphocytes Relative: 56 %
Lymphs Abs: 1.8 10*3/uL (ref 0.7–4.0)
MCH: 29.8 pg (ref 26.0–34.0)
MCHC: 32.5 g/dL (ref 30.0–36.0)
MCV: 91.8 fL (ref 80.0–100.0)
Monocytes Absolute: 0.2 10*3/uL (ref 0.1–1.0)
Monocytes Relative: 5 %
Neutro Abs: 1.2 10*3/uL — ABNORMAL LOW (ref 1.7–7.7)
Neutrophils Relative %: 37 %
Platelet Count: 271 10*3/uL (ref 150–400)
RBC: 3.29 MIL/uL — ABNORMAL LOW (ref 4.22–5.81)
RDW: 15.5 % (ref 11.5–15.5)
WBC Count: 3.1 10*3/uL — ABNORMAL LOW (ref 4.0–10.5)
nRBC: 0 % (ref 0.0–0.2)

## 2019-07-22 LAB — MAGNESIUM: Magnesium: 1.8 mg/dL (ref 1.7–2.4)

## 2019-07-22 MED ORDER — SODIUM CHLORIDE 0.9 % IV SOLN
Freq: Once | INTRAVENOUS | Status: AC
  Administered 2019-07-22: 10:00:00 via INTRAVENOUS
  Filled 2019-07-22: qty 1000

## 2019-07-22 MED ORDER — SODIUM CHLORIDE 0.9 % IV SOLN
Freq: Once | INTRAVENOUS | Status: AC
  Administered 2019-07-22: 09:00:00 via INTRAVENOUS
  Filled 2019-07-22: qty 250

## 2019-07-22 MED ORDER — SODIUM CHLORIDE 0.9 % IV SOLN
800.0000 mg/m2 | Freq: Once | INTRAVENOUS | Status: AC
  Administered 2019-07-22: 14:00:00 1444 mg via INTRAVENOUS
  Filled 2019-07-22: qty 37.98

## 2019-07-22 MED ORDER — PALONOSETRON HCL INJECTION 0.25 MG/5ML
INTRAVENOUS | Status: AC
  Filled 2019-07-22: qty 5

## 2019-07-22 MED ORDER — HEPARIN SOD (PORK) LOCK FLUSH 100 UNIT/ML IV SOLN
500.0000 [IU] | Freq: Once | INTRAVENOUS | Status: AC | PRN
  Administered 2019-07-22: 500 [IU]
  Filled 2019-07-22: qty 5

## 2019-07-22 MED ORDER — PALONOSETRON HCL INJECTION 0.25 MG/5ML
0.2500 mg | Freq: Once | INTRAVENOUS | Status: AC
  Administered 2019-07-22: 0.25 mg via INTRAVENOUS

## 2019-07-22 MED ORDER — SODIUM CHLORIDE 0.9 % IV SOLN
Freq: Once | INTRAVENOUS | Status: AC
  Administered 2019-07-22: 12:00:00 via INTRAVENOUS
  Filled 2019-07-22: qty 5

## 2019-07-22 MED ORDER — SODIUM CHLORIDE 0.9 % IV SOLN
25.0000 mg/m2 | Freq: Once | INTRAVENOUS | Status: AC
  Administered 2019-07-22: 45 mg via INTRAVENOUS
  Filled 2019-07-22: qty 45

## 2019-07-22 MED ORDER — SODIUM CHLORIDE 0.9% FLUSH
10.0000 mL | INTRAVENOUS | Status: DC | PRN
  Administered 2019-07-22: 10 mL
  Filled 2019-07-22: qty 10

## 2019-07-22 NOTE — Patient Instructions (Signed)
[facility] Discharge Instructions for Patients Receiving Chemotherapy  Today you received the following chemotherapy agents Gemzar; Cisplatin  To help prevent nausea and vomiting after your treatment, we encourage you to take your nausea medication as directed If you develop nausea and vomiting that is not controlled by your nausea medication, call the clinic.   BELOW ARE SYMPTOMS THAT SHOULD BE REPORTED IMMEDIATELY:  *FEVER GREATER THAN 100.5 F  *CHILLS WITH OR WITHOUT FEVER  NAUSEA AND VOMITING THAT IS NOT CONTROLLED WITH YOUR NAUSEA MEDICATION  *UNUSUAL SHORTNESS OF BREATH  *UNUSUAL BRUISING OR BLEEDING  TENDERNESS IN MOUTH AND THROAT WITH OR WITHOUT PRESENCE OF ULCERS  *URINARY PROBLEMS  *BOWEL PROBLEMS  UNUSUAL RASH Items with * indicate a potential emergency and should be followed up as soon as possible.  Feel free to call the clinic should you have any questions or concerns. The clinic phone number is [phone].  Please show the CHEMO ALERT CARD at check-in to the Emergency Department and triage nurse.   

## 2019-07-22 NOTE — Progress Notes (Signed)
Per Dr. Maylon Peppers, okay to treat with [place] 1.2.

## 2019-07-23 ENCOUNTER — Ambulatory Visit: Payer: Medicare Other

## 2019-07-24 ENCOUNTER — Inpatient Hospital Stay: Payer: Medicare Other

## 2019-07-24 ENCOUNTER — Other Ambulatory Visit: Payer: Self-pay

## 2019-07-24 VITALS — BP 124/74 | HR 77 | Temp 98.0°F | Resp 16

## 2019-07-24 DIAGNOSIS — Z5111 Encounter for antineoplastic chemotherapy: Secondary | ICD-10-CM | POA: Diagnosis not present

## 2019-07-24 DIAGNOSIS — Z452 Encounter for adjustment and management of vascular access device: Secondary | ICD-10-CM | POA: Diagnosis not present

## 2019-07-24 DIAGNOSIS — J90 Pleural effusion, not elsewhere classified: Secondary | ICD-10-CM | POA: Diagnosis not present

## 2019-07-24 DIAGNOSIS — C221 Intrahepatic bile duct carcinoma: Secondary | ICD-10-CM

## 2019-07-24 DIAGNOSIS — I10 Essential (primary) hypertension: Secondary | ICD-10-CM | POA: Diagnosis not present

## 2019-07-24 DIAGNOSIS — E785 Hyperlipidemia, unspecified: Secondary | ICD-10-CM | POA: Diagnosis not present

## 2019-07-24 MED ORDER — PEGFILGRASTIM-CBQV 6 MG/0.6ML ~~LOC~~ SOSY
6.0000 mg | PREFILLED_SYRINGE | Freq: Once | SUBCUTANEOUS | Status: AC
  Administered 2019-07-24: 6 mg via SUBCUTANEOUS

## 2019-07-24 NOTE — Patient Instructions (Signed)

## 2019-07-25 ENCOUNTER — Encounter: Payer: Self-pay | Admitting: Hematology

## 2019-07-26 ENCOUNTER — Other Ambulatory Visit: Payer: Self-pay

## 2019-07-26 DIAGNOSIS — C221 Intrahepatic bile duct carcinoma: Secondary | ICD-10-CM

## 2019-07-27 ENCOUNTER — Encounter: Payer: Self-pay | Admitting: Hematology

## 2019-07-28 ENCOUNTER — Ambulatory Visit (HOSPITAL_COMMUNITY)
Admission: RE | Admit: 2019-07-28 | Discharge: 2019-07-28 | Disposition: A | Discharge status: Home / Self Care | Payer: Medicare Other | Source: Ambulatory Visit | Attending: Hematology | Admitting: Hematology

## 2019-07-28 ENCOUNTER — Inpatient Hospital Stay: Payer: Medicare Other

## 2019-07-28 ENCOUNTER — Other Ambulatory Visit: Payer: Self-pay | Admitting: Hematology

## 2019-07-28 ENCOUNTER — Other Ambulatory Visit: Payer: Self-pay

## 2019-07-28 DIAGNOSIS — Z5111 Encounter for antineoplastic chemotherapy: Secondary | ICD-10-CM | POA: Diagnosis not present

## 2019-07-28 DIAGNOSIS — C221 Intrahepatic bile duct carcinoma: Secondary | ICD-10-CM

## 2019-07-28 DIAGNOSIS — Z95828 Presence of other vascular implants and grafts: Secondary | ICD-10-CM

## 2019-07-28 DIAGNOSIS — I10 Essential (primary) hypertension: Secondary | ICD-10-CM | POA: Diagnosis not present

## 2019-07-28 DIAGNOSIS — J90 Pleural effusion, not elsewhere classified: Secondary | ICD-10-CM | POA: Diagnosis not present

## 2019-07-28 DIAGNOSIS — Z452 Encounter for adjustment and management of vascular access device: Secondary | ICD-10-CM | POA: Diagnosis not present

## 2019-07-28 DIAGNOSIS — E785 Hyperlipidemia, unspecified: Secondary | ICD-10-CM | POA: Diagnosis not present

## 2019-07-28 MED ORDER — HEPARIN SOD (PORK) LOCK FLUSH 100 UNIT/ML IV SOLN
500.0000 [IU] | Freq: Once | INTRAVENOUS | Status: AC
  Administered 2019-07-28: 500 [IU] via INTRAVENOUS

## 2019-07-28 MED ORDER — SODIUM CHLORIDE (PF) 0.9 % IJ SOLN
INTRAMUSCULAR | Status: AC
  Filled 2019-07-28: qty 50

## 2019-07-28 MED ORDER — IOHEXOL 300 MG/ML  SOLN
100.0000 mL | Freq: Once | INTRAMUSCULAR | Status: AC | PRN
  Administered 2019-07-28: 100 mL via INTRAVENOUS

## 2019-07-28 MED ORDER — SODIUM CHLORIDE 0.9% FLUSH
10.0000 mL | INTRAVENOUS | Status: DC | PRN
  Administered 2019-07-28: 10 mL
  Filled 2019-07-28: qty 10

## 2019-07-28 MED ORDER — HEPARIN SOD (PORK) LOCK FLUSH 100 UNIT/ML IV SOLN
INTRAVENOUS | Status: AC
  Filled 2019-07-28: qty 5

## 2019-07-28 NOTE — Patient Instructions (Signed)

## 2019-08-02 DIAGNOSIS — C221 Intrahepatic bile duct carcinoma: Secondary | ICD-10-CM | POA: Diagnosis not present

## 2019-08-04 NOTE — Progress Notes (Signed)
Christopher Welch   [phone] [phone]   Clinic Follow up Note   Patient Care Team: [name], DO as PCP - General (Family Medicine) 08/05/2019  CHIEF COMPLAINT: F/u extrahepatic cholangiocarcinoma   SUMMARY OF ONCOLOGIC HISTORY: Oncology History  Cholangiocarcinoma ([place])  01/14/2019 Imaging   US Abdomen 01/14/19  IMPRESSION: There is significant intrahepatic and extrahepatic biliary dilatation present, concerning for distal common bile duct obstruction. Further evaluation with CT scan or MRCP is recommended. Correlation with liver function tests is recommended as well. These results will be called to the ordering clinician or representative by the Radiologist Assistant, and communication documented in the PACS or zVision Dashboard.   Probable large amount of sludge seen within gallbladder lumen with mild gallbladder wall thickening. However, no cholelithiasis, pericholecystic fluid or sonographic Murphy's sign is noted.   4.2 cm septated cyst seen in upper pole of right kidney consistent with Bosniak type 2 lesion. Follow-up ultrasound in 1 year is recommended to ensure stability.   01/20/2019 Imaging   MRI abdomen 01/20/19  IMPRESSION: 1. Findings are highly concerning for central tumor in the biliary tract at the confluence of the common hepatic duct, cystic duct and common bile ducts. Further clinical evaluation for potential cholangiocarcinoma is strongly recommended. 2. Mild ductal dilatation of the main pancreatic duct throughout the distal body and tail of the pancreas where there is also some associated side branch ectasia. This may suggest a pancreatic ductal stricture. No obstructing pancreatic neoplasm identified. 3. Aortic atherosclerosis.   01/21/2019 Initial Biopsy   Diagnosis 01/21/19  BILE DUCT BRUSHING (SPECIMEN 1 OF 1 COLLECTED 01/21/2019) ADENOCARCINOMA.   01/21/2019 Procedure   ERCP By Dr hung 01/21/19 IMRPESSION -  The major papilla appeared normal. - A single localized biliary stricture was found in the middle third of the main bile duct. - The entire main bile duct and upper third of the main bile duct were dilated, secondary to a stricture. - A biliary sphincterotomy was performed. - Cells for cytology obtained in the middle third of the main bile duct. - One plastic stent was placed into the common bile duct.  EUS by Dr hung 01/21/19  IMPRESSION - There was dilation in the middle third of the main bile duct and in the upper third of the main bile duct which measured up to 15 mm. - There was a suggestion of a stricture in the middle third of the main bile duct. - No specimens collected.   02/03/2019 Imaging   [facility] CAP at [facility] 02/03/19  Impression:  1. There is mild intrahepatic and extrahepatic biliary ductal dilation and diffuse common bile duct wall thickening with stent in place. There is abnormal soft tissue measuring approximately 1.6 cm between the common hepatic artery, portal vein and common bile duct, worrisome for the known cholangiocarcinoma.  2. Periportal lymphadenopathy which may represent nodal metastasis.  4. Marked pancreatic atrophy and duct dilation involving the distal body and tail the pancreas where there is a coarse calcification. These findings are favored to represent stenosis from intraductal stones though an underlying stricture cannot be completely excluded.  5. Atypical symmetric prominent fat stranding of the bilateral lower abdominal wall. Correlate with surgical history or history of history of trauma.   03/08/2019 Initial Diagnosis   Cholangiocarcinoma ([place])   03/12/2019 -  Chemotherapy   gemcitabine and cisplatin on days 1 and 8 every 21 days starting 03/12/19. Abraxane added with cycle 2. Added GCSF [name]) to day 9 starting cycle  2.    03/19/2019 Cancer Staging   Staging form: Perihilar Bile Ducts, AJCC 8th Edition - Clinical: Stage Unknown (cTX, cN0,  cM0) - Signed by [name], MD on 03/19/2019   05/24/2019 Imaging   CT CAP W Contrast at [facility]  1.  Interval placement of covered metallic biliary stent. No progressive dilation of the biliary tree and resulting resolution of the prior main pancreatic duct dilation. 2.  Evidence of some subtle vascular contour deformity involving the hepatic artery, suspected to be from tumoral contact and/or posttreatment changes. 3.  New small right pleural effusion with overlying mild airspace disease. Secondary findings which may indicate a component of aspiration. 4.  No convincing evidence of new disseminated metastatic disease. 5.  Additional findings as discussed above.   06/18/2019 Imaging   CT AP IMPRESSION: 1.  No acute findings in the abdomen/pelvis. 2. Biliary stent in adequate position. No evidence of focal mass or adenopathy in the region of the pancreatic head or porta hepatis. 3. Moderate size right pleural effusion with associated right basilar atelectasis. 4. Possible single gallstone. Stable subcentimeter liver cyst. Stable right renal cysts. Small left inguinal hernia containing only peritoneal fat. 5. Aortic Atherosclerosis (ICD10-I70.0).   07/28/2019 Imaging   CT CAP IMPRESSION: 1. The patient is status post common bile duct stent placement, with unchanged stent position and patent appearance with pneumobilia. Primary cholangiocarcinoma is not discretely appreciated by CT. 2. No direct evidence of metastatic disease in the chest, abdomen, or pelvis. 3. Moderate right pleural effusion with associated atelectasis or consolidation, slightly improved compared to prior examination. 4. The pancreatic parenchyma is atrophic and calcified, particularly in the pancreatic tail. 5.  Coronary artery disease.  Aortic atherosclerosis     CURRENT THERAPY:  Gemcitabine and cisplatin on days 1 and 8 every 21 daysstarting 03/12/19. Abraxane added with cycle 2.AddedGCSF ([name])to day 9  startingcycle 2.Chemo held since 06/03/19 due to poor toleration. Chemo restarted with Gem/Cis with C6 on 07/02/19.  INTERVAL HISTORY: Christopher Welch returns for follow-up and treatment as scheduled.  He completed cycle 6 gemcitabine and cisplatin on 07/22/19 and [name] on 10/24.  He underwent restaging CT on 07/28/19 and was seen in f/u by Dr. Carlis Abbott on 11/2 who recommends to continue current systemic chemotherapy. He will return in about 8 weeks.   Today he is doing OK, had nausea and one episode of vomiting for 3 days this week. zofran and compazine are helpful. He was able to eat and drink. Appetite is good. Energy level is fair, he worked 4 hours last week. Has not been very active this week, spends most of day resting but can get up when he needs to. He is beginning to notice mild dyspnea, can walk flight of stairs without stopping and from car to lobby. Denies cough or chest pain. Bowels normal. No new pain. Denies bleeding or mouth sores. Denies neuropathy.    MEDICAL HISTORY:  Past Medical History:  Diagnosis Date  . Diabetes mellitus type I, controlled ([place])   . Dyslipidemia   . ED (erectile dysfunction)   . Essential hypertension   . Gilbert's disease   . Hepatitis B carrier [facility])     SURGICAL HISTORY: Past Surgical History:  Procedure Laterality Date  . APPENDECTOMY  1964  . BILIARY BRUSHING  01/21/2019   Procedure: BILIARY BRUSHING;  Surgeon: Carol Ada, MD;  Location: [facility] ENDOSCOPY;  Service: Endoscopy;;  . BILIARY STENT PLACEMENT N/A 01/21/2019   Procedure: BILIARY STENT PLACEMENT;  Surgeon: Benson Norway,  Saralyn Pilar, MD;  Location: [facility];  Service: Endoscopy;  Laterality: N/A;  . ENDOSCOPIC RETROGRADE CHOLANGIOPANCREATOGRAPHY (ERCP) WITH PROPOFOL N/A 01/21/2019   Procedure: ENDOSCOPIC RETROGRADE CHOLANGIOPANCREATOGRAPHY (ERCP) WITH PROPOFOL;  Surgeon: Carol Ada, MD;  Location: [facility];  Service: Endoscopy;  Laterality: N/A;  . ESOPHAGOGASTRODUODENOSCOPY (EGD) WITH PROPOFOL  N/A 01/21/2019   Procedure: ESOPHAGOGASTRODUODENOSCOPY (EGD) WITH PROPOFOL;  Surgeon: Carol Ada, MD;  Location: [facility];  Service: Endoscopy;  Laterality: N/A;  . FEMORAL HERNIA REPAIR    . IR CHOLANGIOGRAM EXISTING TUBE  05/13/2019  . IR CHOLANGIOGRAM EXISTING TUBE  06/08/2019  . IR EXCHANGE BILIARY DRAIN  05/18/2019  . IR EXCHANGE BILIARY DRAIN  06/08/2019  . IR IMAGING GUIDED PORT INSERTION  03/08/2019  . IR PERC CHOLECYSTOSTOMY  05/04/2019  . LEFT HEART CATH AND CORONARY ANGIOGRAPHY N/A 05/19/2017   Procedure: LEFT HEART CATH AND CORONARY ANGIOGRAPHY;  Surgeon: Leonie Man, MD;  Location: [facility];  Service: Cardiovascular;  Laterality: N/A;  . SPHINCTEROTOMY  01/21/2019   Procedure: SPHINCTEROTOMY;  Surgeon: Carol Ada, MD;  Location: [facility];  Service: Endoscopy;;  . UPPER ESOPHAGEAL ENDOSCOPIC ULTRASOUND (EUS) N/A 01/21/2019   Procedure: UPPER ESOPHAGEAL ENDOSCOPIC ULTRASOUND (EUS);  Surgeon: Carol Ada, MD;  Location: [facility];  Service: Endoscopy;  Laterality: N/A;    I have reviewed the social history and family history with the patient and they are unchanged from previous note.  ALLERGIES:  is allergic to erythromycin.  MEDICATIONS:  Current Outpatient Medications  Medication Sig Dispense Refill  . acetaminophen (TYLENOL) 500 MG tablet Take 1,000 mg by mouth 2 (two) times daily as needed for moderate pain or headache.    Marland Kitchen amLODipine (NORVASC) 5 MG tablet Take 5 mg by mouth daily.     Marland Kitchen atenolol (TENORMIN) 25 MG tablet Take 25 mg by mouth daily. Once a day     . atorvastatin (LIPITOR) 10 MG tablet Take 10 mg by mouth daily.    . Blood Glucose Monitoring Suppl (ACCU-CHEK NANO SMARTVIEW) W/DEVICE KIT 1 kit by Does not apply route 2 (two) times daily. (Patient taking differently: 1 kit by Does not apply route See admin instructions. Test blood sugars 12x's daily) 1 kit 0  . glucose blood test strip Test 3 times a day. (Patient taking differently: 1 each  by Other route See admin instructions. Test 12 times a day.) 300 each Prn  . insulin NPH Human (NOVOLIN N) 100 UNIT/ML injection Inject 20 Units into the skin at bedtime.     . insulin regular (NOVOLIN R) 100 units/mL injection Inject 20 Units into the skin 3 (three) times daily before meals.     . lidocaine-prilocaine (EMLA) cream Apply to affected area once 30 g 3  . lisinopril (ZESTRIL) 20 MG tablet Take 20 mg by mouth daily.     . magnesium oxide (MAG-OX) 400 MG tablet TAKE 1 TABLET BY MOUTH EVERY DAY (Patient taking differently: Take 400 mg by mouth daily. ) 90 tablet 0  . metFORMIN (GLUCOPHAGE) 1000 MG tablet Take 2,000 mg by mouth every evening.     . ondansetron (ZOFRAN) 8 MG tablet TAKE 1 TABLET BY MOUTH 2 TIMES DAY AS NEEDED. START ON 3RD DAY AFTER CHEMOTHERAPY (Patient taking differently: Take 8 mg by mouth 2 (two) times daily as needed. Start on 3rd day after chemotherapy) 30 tablet 1  . oxycodone (OXY-IR) 5 MG capsule Take 1 capsule (5 mg total) by mouth every 6 (six) hours as needed for pain. [place]  capsule 0  . prochlorperazine (COMPAZINE) 10 MG tablet TAKE 1 TABLET (10 MG TOTAL) BY MOUTH EVERY 6 (SIX) HOURS AS NEEDED (NAUSEA OR VOMITING). (Patient taking differently: Take 10 mg by mouth every 6 (six) hours as needed for nausea or vomiting (Nausea or vomiting). ) 30 tablet 1  . zolpidem (AMBIEN) 5 MG tablet Take 1-2 tablets (5-10 mg total) by mouth at bedtime as needed for sleep. 60 tablet 0   No current facility-administered medications for this visit.     PHYSICAL EXAMINATION: ECOG PERFORMANCE STATUS: 2 - Symptomatic, <50% confined to bed  Vitals:   08/05/19 1126  BP: 122/64  Pulse: 72  Resp: 18  Temp: 98.6 F (37 C)  SpO2: 99%   Filed Weights   08/05/19 1126  Weight: 146 lb 8 oz (66.5 kg)    GENERAL:alert, no distress and comfortable SKIN: no rash  EYES: sclera clear LUNGS: clear to auscultation, diminished right base, normal breathing effort HEART: regular rate &  rhythm, no lower extremity edema ABDOMEN:abdomen soft, non-tender and normal bowel sounds NEURO: alert & oriented x 3 with fluent speech, no focal motor/sensory deficits PAC without erythema   LABORATORY DATA:  I have reviewed the data as listed CBC Latest Ref Rng & Units 08/05/2019 07/22/2019 07/16/2019  WBC 4.0 - 10.5 K/uL 9.8 3.1(L) 6.2  Hemoglobin 13.0 - 17.0 g/dL 10.1(L) 9.8(L) 10.3(L)  Hematocrit 39.0 - 52.0 % 30.8(L) 30.2(L) 32.3(L)  Platelets 150 - 400 K/uL 355 271 256     CMP Latest Ref Rng & Units 08/05/2019 07/22/2019 07/16/2019  Glucose 70 - 99 mg/dL 204(H) 224(H) 200(H)  BUN 8 - 23 mg/dL 19 18 15   Creatinine 0.61 - 1.24 mg/dL 1.34(H) 1.17 1.19  Sodium 135 - 145 mmol/L 134(L) 137 137  Potassium 3.5 - 5.1 mmol/L 4.3 4.3 4.1  Chloride 98 - 111 mmol/L 99 102 102  CO2 22 - 32 mmol/L 26 24 26   Calcium 8.9 - 10.3 mg/dL 8.9 8.8(L) 9.1  Total Protein 6.5 - 8.1 g/dL 6.7 6.7 6.8  Total Bilirubin 0.3 - 1.2 mg/dL 0.2(L) 0.4 0.4  Alkaline Phos 38 - 126 U/L 60 48 50  AST 15 - 41 U/L 13(L) 13(L) 13(L)  ALT 0 - 44 U/L 13 12 11       RADIOGRAPHIC STUDIES: I have personally reviewed the radiological images as listed and agreed with the findings in the report. No results found.   ASSESSMENT & PLAN: Christopher Welch a 73 y.o.malewith   1. Extrahepatic cholangiocarcinoma, cTxN0M0 -He wasdiagnosedin 12/2018; presented withmalignant stricture and CBD obstruction required stenting. Brushing revealed adenocarcinoma. He underwent attempted whipple per [facility] Zenia Resides at [facility] which was aborted due to vascular invasion -He was referred to our clinic to initiate chemotherapy in the neoadjuvant setting. -He proceeded with neoadjuvantgemcitabine and cisplatin on days 1 and 8 every 21 daysstarting 03/12/19. Abraxane added with cycle 2.AddedGCSF ([name])to day 9 startingcycle 2. -Hetolerated first few cycles very well without significant side effects;tumor marker CA 19.9dropped  significantly as he started chemo, indicating good response to chemo treatment -his performance status decreased lately, abraxane was held with cycle 4   -He underwent restaging CT at request [facility] health on 05/24/2019 which was overall stable, no new or progressive metastatic disease. Dr. Carlis Abbott recommended to continue chemotherapy.  -S/p 6 cycles of cisplatin and gemcitabine with dose reduction. Added [name] on day 9 for neutropenia -Christopher Welch appears stable today. He tolerates treatment mostly well with mild to moderate fatigue and intermittent nausea,  not much vomiting. He remains functional with rest periods. N/v is controlled with zofran and compazine. He recovers moderately well.  -He underwent restaging CT and f/u with Dr. Carlis Abbott on 11/2, CT shows that the primary tumor is not well visualized, and no evidence of metastatic disease in the chest, abdomen, or pelvis. Dr. Carlis Abbott recommended to continue current chemo, Dr. Burr Medico has reviewed and agrees with the plan. He will return to [facility] in 8 weeks. -Labs reviewed, CBC and CMP stable, adequate for treatment -Cr elevated, possibly from recent CT contrast vs dehydration from n/v. I recommend to push po liquids, hold lisinopril, and avoid NSAIDs and nephrotoxic drugs.  -He will proceed with cycle 7 day 1 cisplatin and dose-reduced gemcitabine to 800 mg/m2 (current dose) on 11/6. He will return for day 8 chemo next week and [name] on day 9.  -F/u in 3 weeks with next cycle  2. Acute cholecystitis -Hospitalizedon 05/03/19; s/ppercutaneous gallbladder drainage tube placed by IR8/5/20. -completed antibiotics -tube exchanged 8/18 due to leakagewhich has resolved, his abdominal pain improved until it began leaking again on 9/8. IR could not exchange the tube as the gallbladder was completely decompressed and it was removed -[place] on 9/18 showed no evidence of cholecystitis -no concerns for acute recurrent infection today  3. Weight lossand low appetite  -secondary to malignancyand chemo -he presented with 20 lbs weight loss  -We reviewed nutrition support withGlucerna -Continue to f/u with dietician -His appetitehas dropped significantly with recent cholecystitis.  -continues carnation and boost supplements -weight improved, has been able to gain weight lately   4. DM -managed by PCP Dr. Theda Sers -he has had DM for 20 years, he is compliant with regimen and knows how to adjust BG -on insulin, he knows how to adjust dose based on his BG level -BG 204 today  5. CAD, HL, HTN -followed by cardiologist Dr. Ellyn Hack -on amlodipine, atenolol, lisinopril, and statin -BP 122/64 today. Due to increased Cr to 1.34, I recommend to hold lisinopril for 1 week. Will reevaluate next week when he returns for day 8 chemo   6. Social support -he is single, no children, he lives alone -He has a good friend whochecks on him -previously referred to [facility]  7.Insomnia -He is able to fall asleep but has trouble staying asleep -He has tried OTC Z-quil which only moderately help. -on Ambien PRN  PLAN: -Labs, CT reviewed -Hold lisinopril and NSAIDs for Cr 1.34, push po liquids -Proceed with cycle 7 day 1 cisplatin and gemcitabine dose-reduced to 800 mg/m2 -Day 8 chemo next week and [name] on day 9 -F/u in 3 weeks   All questions were answered. The patient knows to call the clinic with any problems, questions or concerns. No barriers to learning was detected.     Alla Feeling, NP 08/05/19

## 2019-08-05 ENCOUNTER — Telehealth: Payer: Self-pay | Admitting: Nurse Practitioner

## 2019-08-05 ENCOUNTER — Other Ambulatory Visit: Payer: Self-pay

## 2019-08-05 ENCOUNTER — Inpatient Hospital Stay: Payer: Medicare Other | Attending: Hematology

## 2019-08-05 ENCOUNTER — Inpatient Hospital Stay: Payer: Medicare Other

## 2019-08-05 ENCOUNTER — Inpatient Hospital Stay (HOSPITAL_BASED_OUTPATIENT_CLINIC_OR_DEPARTMENT_OTHER): Payer: Medicare Other | Admitting: Nurse Practitioner

## 2019-08-05 ENCOUNTER — Encounter: Payer: Self-pay | Admitting: Nurse Practitioner

## 2019-08-05 VITALS — BP 122/64 | HR 72 | Temp 98.6°F | Resp 18 | Ht 67.0 in | Wt 146.5 lb

## 2019-08-05 DIAGNOSIS — R112 Nausea with vomiting, unspecified: Secondary | ICD-10-CM | POA: Diagnosis not present

## 2019-08-05 DIAGNOSIS — G4700 Insomnia, unspecified: Secondary | ICD-10-CM | POA: Insufficient documentation

## 2019-08-05 DIAGNOSIS — Z5189 Encounter for other specified aftercare: Secondary | ICD-10-CM | POA: Insufficient documentation

## 2019-08-05 DIAGNOSIS — I10 Essential (primary) hypertension: Secondary | ICD-10-CM | POA: Diagnosis not present

## 2019-08-05 DIAGNOSIS — R5383 Other fatigue: Secondary | ICD-10-CM | POA: Insufficient documentation

## 2019-08-05 DIAGNOSIS — R634 Abnormal weight loss: Secondary | ICD-10-CM | POA: Diagnosis not present

## 2019-08-05 DIAGNOSIS — Z794 Long term (current) use of insulin: Secondary | ICD-10-CM | POA: Insufficient documentation

## 2019-08-05 DIAGNOSIS — R0600 Dyspnea, unspecified: Secondary | ICD-10-CM | POA: Diagnosis not present

## 2019-08-05 DIAGNOSIS — Z5111 Encounter for antineoplastic chemotherapy: Secondary | ICD-10-CM | POA: Diagnosis not present

## 2019-08-05 DIAGNOSIS — Z95828 Presence of other vascular implants and grafts: Secondary | ICD-10-CM

## 2019-08-05 DIAGNOSIS — R531 Weakness: Secondary | ICD-10-CM | POA: Diagnosis not present

## 2019-08-05 DIAGNOSIS — C221 Intrahepatic bile duct carcinoma: Secondary | ICD-10-CM

## 2019-08-05 DIAGNOSIS — E109 Type 1 diabetes mellitus without complications: Secondary | ICD-10-CM | POA: Insufficient documentation

## 2019-08-05 DIAGNOSIS — Z79899 Other long term (current) drug therapy: Secondary | ICD-10-CM | POA: Insufficient documentation

## 2019-08-05 DIAGNOSIS — B181 Chronic viral hepatitis B without delta-agent: Secondary | ICD-10-CM | POA: Diagnosis not present

## 2019-08-05 DIAGNOSIS — R05 Cough: Secondary | ICD-10-CM | POA: Insufficient documentation

## 2019-08-05 DIAGNOSIS — E785 Hyperlipidemia, unspecified: Secondary | ICD-10-CM | POA: Insufficient documentation

## 2019-08-05 DIAGNOSIS — Z452 Encounter for adjustment and management of vascular access device: Secondary | ICD-10-CM | POA: Diagnosis present

## 2019-08-05 LAB — CBC WITH DIFFERENTIAL (CANCER CENTER ONLY)
Abs Immature Granulocytes: 0.13 10*3/uL — ABNORMAL HIGH (ref 0.00–0.07)
Basophils Absolute: 0 10*3/uL (ref 0.0–0.1)
Basophils Relative: 0 %
Eosinophils Absolute: 0.1 10*3/uL (ref 0.0–0.5)
Eosinophils Relative: 1 %
HCT: 30.8 % — ABNORMAL LOW (ref 39.0–52.0)
Hemoglobin: 10.1 g/dL — ABNORMAL LOW (ref 13.0–17.0)
Immature Granulocytes: 1 %
Lymphocytes Relative: 17 %
Lymphs Abs: 1.7 10*3/uL (ref 0.7–4.0)
MCH: 30.2 pg (ref 26.0–34.0)
MCHC: 32.8 g/dL (ref 30.0–36.0)
MCV: 92.2 fL (ref 80.0–100.0)
Monocytes Absolute: 1.8 10*3/uL — ABNORMAL HIGH (ref 0.1–1.0)
Monocytes Relative: 18 %
Neutro Abs: 6.1 10*3/uL (ref 1.7–7.7)
Neutrophils Relative %: 63 %
Platelet Count: 355 10*3/uL (ref 150–400)
RBC: 3.34 MIL/uL — ABNORMAL LOW (ref 4.22–5.81)
RDW: 16.9 % — ABNORMAL HIGH (ref 11.5–15.5)
WBC Count: 9.8 10*3/uL (ref 4.0–10.5)
nRBC: 0 % (ref 0.0–0.2)

## 2019-08-05 LAB — CMP (CANCER CENTER ONLY)
ALT: 13 U/L (ref 0–44)
AST: 13 U/L — ABNORMAL LOW (ref 15–41)
Albumin: 3.2 g/dL — ABNORMAL LOW (ref 3.5–5.0)
Alkaline Phosphatase: 60 U/L (ref 38–126)
Anion gap: 9 (ref 5–15)
BUN: 19 mg/dL (ref 8–23)
CO2: 26 mmol/L (ref 22–32)
Calcium: 8.9 mg/dL (ref 8.9–10.3)
Chloride: 99 mmol/L (ref 98–111)
Creatinine: 1.34 mg/dL — ABNORMAL HIGH (ref 0.61–1.24)
GFR, Est AFR Am: >60 mL/min (ref >60)
GFR, Estimated: 53 mL/min — ABNORMAL LOW (ref >60)
Glucose, Bld: 204 mg/dL — ABNORMAL HIGH (ref 70–99)
Potassium: 4.3 mmol/L (ref 3.5–5.1)
Sodium: 134 mmol/L — ABNORMAL LOW (ref 135–145)
Total Bilirubin: 0.2 mg/dL — ABNORMAL LOW (ref 0.3–1.2)
Total Protein: 6.7 g/dL (ref 6.5–8.1)

## 2019-08-05 LAB — MAGNESIUM: Magnesium: 2 mg/dL (ref 1.7–2.4)

## 2019-08-05 MED ORDER — HEPARIN SOD (PORK) LOCK FLUSH 100 UNIT/ML IV SOLN
500.0000 [IU] | Freq: Once | INTRAVENOUS | Status: AC | PRN
  Administered 2019-08-05: 11:00:00 500 [IU]
  Filled 2019-08-05: qty 5

## 2019-08-05 MED ORDER — SODIUM CHLORIDE 0.9% FLUSH
10.0000 mL | INTRAVENOUS | Status: DC | PRN
  Administered 2019-08-05: 10 mL
  Filled 2019-08-05: qty 10

## 2019-08-05 NOTE — Progress Notes (Signed)
Patient didn't want to stay accessed for infusion appt on 11/6 so I flushed his port and de-accessed.

## 2019-08-05 NOTE — Patient Instructions (Signed)

## 2019-08-05 NOTE — Telephone Encounter (Signed)
Scheduled appt per 11/5 los.  Snt a staff message for his treatment on 11/25 as an add-on.  Will contact patient once treatment has been added

## 2019-08-06 ENCOUNTER — Other Ambulatory Visit: Payer: Self-pay

## 2019-08-06 ENCOUNTER — Inpatient Hospital Stay: Payer: Medicare Other

## 2019-08-06 VITALS — BP 136/71 | HR 63 | Temp 97.9°F | Resp 18

## 2019-08-06 DIAGNOSIS — R5383 Other fatigue: Secondary | ICD-10-CM | POA: Diagnosis not present

## 2019-08-06 DIAGNOSIS — C221 Intrahepatic bile duct carcinoma: Secondary | ICD-10-CM | POA: Diagnosis not present

## 2019-08-06 DIAGNOSIS — Z5111 Encounter for antineoplastic chemotherapy: Secondary | ICD-10-CM | POA: Diagnosis not present

## 2019-08-06 DIAGNOSIS — R0600 Dyspnea, unspecified: Secondary | ICD-10-CM | POA: Diagnosis not present

## 2019-08-06 DIAGNOSIS — R531 Weakness: Secondary | ICD-10-CM | POA: Diagnosis not present

## 2019-08-06 DIAGNOSIS — R112 Nausea with vomiting, unspecified: Secondary | ICD-10-CM | POA: Diagnosis not present

## 2019-08-06 LAB — CANCER ANTIGEN 19-9: CA 19-9: 33 U/mL (ref 0–35)

## 2019-08-06 MED ORDER — SODIUM CHLORIDE 0.9 % IV SOLN
Freq: Once | INTRAVENOUS | Status: AC
  Administered 2019-08-06: 10:00:00 via INTRAVENOUS
  Filled 2019-08-06: qty 1000

## 2019-08-06 MED ORDER — PALONOSETRON HCL INJECTION 0.25 MG/5ML
INTRAVENOUS | Status: AC
  Filled 2019-08-06: qty 5

## 2019-08-06 MED ORDER — SODIUM CHLORIDE 0.9 % IV SOLN
Freq: Once | INTRAVENOUS | Status: AC
  Administered 2019-08-06: 12:00:00 via INTRAVENOUS
  Filled 2019-08-06: qty 5

## 2019-08-06 MED ORDER — SODIUM CHLORIDE 0.9% FLUSH
10.0000 mL | INTRAVENOUS | Status: DC | PRN
  Administered 2019-08-06: 17:00:00 10 mL
  Filled 2019-08-06: qty 10

## 2019-08-06 MED ORDER — HEPARIN SOD (PORK) LOCK FLUSH 100 UNIT/ML IV SOLN
500.0000 [IU] | Freq: Once | INTRAVENOUS | Status: AC | PRN
  Administered 2019-08-06: 500 [IU]
  Filled 2019-08-06: qty 5

## 2019-08-06 MED ORDER — PALONOSETRON HCL INJECTION 0.25 MG/5ML
0.2500 mg | Freq: Once | INTRAVENOUS | Status: AC
  Administered 2019-08-06: 0.25 mg via INTRAVENOUS

## 2019-08-06 MED ORDER — SODIUM CHLORIDE 0.9 % IV SOLN
800.0000 mg/m2 | Freq: Once | INTRAVENOUS | Status: AC
  Administered 2019-08-06: 1444 mg via INTRAVENOUS
  Filled 2019-08-06: qty 37.98

## 2019-08-06 MED ORDER — SODIUM CHLORIDE 0.9 % IV SOLN
25.0000 mg/m2 | Freq: Once | INTRAVENOUS | Status: AC
  Administered 2019-08-06: 45 mg via INTRAVENOUS
  Filled 2019-08-06: qty 45

## 2019-08-06 MED ORDER — SODIUM CHLORIDE 0.9 % IV SOLN
Freq: Once | INTRAVENOUS | Status: AC
  Administered 2019-08-06: 09:00:00 via INTRAVENOUS
  Filled 2019-08-06: qty 250

## 2019-08-06 NOTE — Patient Instructions (Signed)
[facility] Discharge Instructions for Patients Receiving Chemotherapy  Today you received the following chemotherapy agents Gemcitabine (GEMZAR) & Cisplatin (PLATINOL).  To help prevent nausea and vomiting after your treatment, we encourage you to take your nausea medication as prescribed.   If you develop nausea and vomiting that is not controlled by your nausea medication, call the clinic.   BELOW ARE SYMPTOMS THAT SHOULD BE REPORTED IMMEDIATELY:  *FEVER GREATER THAN 100.5 F  *CHILLS WITH OR WITHOUT FEVER  NAUSEA AND VOMITING THAT IS NOT CONTROLLED WITH YOUR NAUSEA MEDICATION  *UNUSUAL SHORTNESS OF BREATH  *UNUSUAL BRUISING OR BLEEDING  TENDERNESS IN MOUTH AND THROAT WITH OR WITHOUT PRESENCE OF ULCERS  *URINARY PROBLEMS  *BOWEL PROBLEMS  UNUSUAL RASH Items with * indicate a potential emergency and should be followed up as soon as possible.  Feel free to call the clinic should you have any questions or concerns. The clinic phone number is [phone].  Please show the CHEMO ALERT CARD at check-in to the Emergency Department and triage nurse.  Coronavirus (COVID-19) Are you at risk?  Are you at risk for the Coronavirus (COVID-19)?  To be considered HIGH RISK for Coronavirus (COVID-19), you have to meet the following criteria:  . Traveled to [place], [place], [place], [place] or [place]; or in the [place] to [place], [place], [place], or [place]; and have fever, cough, and shortness of breath within the last 2 weeks of travel OR . Been in close contact with a person diagnosed with COVID-19 within the last 2 weeks and have fever, cough, and shortness of breath . IF YOU DO NOT MEET THESE CRITERIA, YOU ARE CONSIDERED LOW RISK FOR COVID-19.  What to do if you are HIGH RISK for COVID-19?  . If you are having a medical emergency, call 911. . Seek medical care right away. Before you go to a doctor's office, urgent care or emergency department, call  ahead and tell them about your recent travel, contact with someone diagnosed with COVID-19, and your symptoms. You should receive instructions from your physician's office regarding next steps of care.  . When you arrive at healthcare provider, tell the healthcare staff immediately you have returned from visiting [place], [place], [place], [place] or [place]; or traveled in the [place] to [place], [place], [place], or [place]; in the last two weeks or you have been in close contact with a person diagnosed with COVID-19 in the last 2 weeks.   . Tell the health care staff about your symptoms: fever, cough and shortness of breath. . After you have been seen by a medical provider, you will be either: o Tested for (COVID-19) and discharged home on quarantine except to seek medical care if symptoms worsen, and asked to  - Stay home and avoid contact with others until you get your results (4-5 days)  - Avoid travel on public transportation if possible (such as bus, train, or airplane) or o Sent to the Emergency Department by EMS for evaluation, COVID-19 testing, and possible admission depending on your condition and test results.  What to do if you are LOW RISK for COVID-19?  Reduce your risk of any infection by using the same precautions used for avoiding the common cold or flu:  . Wash your hands often with soap and warm water for at least 20 seconds.  If soap and water are not readily available, use an alcohol-based hand sanitizer with at least 60% alcohol.  .   If coughing or sneezing, cover your mouth and nose by coughing or sneezing into the elbow areas of your shirt or coat, into a tissue or into your sleeve (not your hands). . Avoid shaking hands with others and consider head nods or verbal greetings only. . Avoid touching your eyes, nose, or mouth with unwashed hands.  . Avoid close contact with people who are sick. . Avoid places or events with large numbers of people in one location,  like concerts or sporting events. . Carefully consider travel plans you have or are making. . If you are planning any travel outside or inside the [place], visit the CDC's Travelers' Health webpage for the latest health notices. . If you have some symptoms but not all symptoms, continue to monitor at home and seek medical attention if your symptoms worsen. . If you are having a medical emergency, call 911.   ADDITIONAL HEALTHCARE OPTIONS FOR PATIENTS   Telehealth / e-Visit: [email or web address]         [facility]: [phone]  [facility]: [phone]                   [facility]: [phone]    

## 2019-08-07 ENCOUNTER — Encounter: Payer: Self-pay | Admitting: Hematology

## 2019-08-08 ENCOUNTER — Encounter: Payer: Self-pay | Admitting: Nurse Practitioner

## 2019-08-12 ENCOUNTER — Inpatient Hospital Stay: Payer: Medicare Other

## 2019-08-12 ENCOUNTER — Other Ambulatory Visit: Payer: Self-pay

## 2019-08-12 VITALS — BP 139/83 | HR 87 | Temp 98.0°F | Resp 18 | Wt 145.8 lb

## 2019-08-12 DIAGNOSIS — R5383 Other fatigue: Secondary | ICD-10-CM | POA: Diagnosis not present

## 2019-08-12 DIAGNOSIS — R0600 Dyspnea, unspecified: Secondary | ICD-10-CM | POA: Diagnosis not present

## 2019-08-12 DIAGNOSIS — Z5111 Encounter for antineoplastic chemotherapy: Secondary | ICD-10-CM | POA: Diagnosis not present

## 2019-08-12 DIAGNOSIS — R112 Nausea with vomiting, unspecified: Secondary | ICD-10-CM | POA: Diagnosis not present

## 2019-08-12 DIAGNOSIS — R531 Weakness: Secondary | ICD-10-CM | POA: Diagnosis not present

## 2019-08-12 DIAGNOSIS — C221 Intrahepatic bile duct carcinoma: Secondary | ICD-10-CM

## 2019-08-12 LAB — CMP (CANCER CENTER ONLY)
ALT: 15 U/L (ref 0–44)
AST: 14 U/L — ABNORMAL LOW (ref 15–41)
Albumin: 3.4 g/dL — ABNORMAL LOW (ref 3.5–5.0)
Alkaline Phosphatase: 61 U/L (ref 38–126)
Anion gap: 11 (ref 5–15)
BUN: 19 mg/dL (ref 8–23)
CO2: 25 mmol/L (ref 22–32)
Calcium: 9 mg/dL (ref 8.9–10.3)
Chloride: 98 mmol/L (ref 98–111)
Creatinine: 1.4 mg/dL — ABNORMAL HIGH (ref 0.61–1.24)
GFR, Est AFR Am: 58 mL/min — ABNORMAL LOW (ref >60)
GFR, Estimated: 50 mL/min — ABNORMAL LOW (ref >60)
Glucose, Bld: 253 mg/dL — ABNORMAL HIGH (ref 70–99)
Potassium: 4.8 mmol/L (ref 3.5–5.1)
Sodium: 134 mmol/L — ABNORMAL LOW (ref 135–145)
Total Bilirubin: 0.4 mg/dL (ref 0.3–1.2)
Total Protein: 7.1 g/dL (ref 6.5–8.1)

## 2019-08-12 LAB — CBC WITH DIFFERENTIAL (CANCER CENTER ONLY)
Abs Immature Granulocytes: 0.05 10*3/uL (ref 0.00–0.07)
Basophils Absolute: 0.1 10*3/uL (ref 0.0–0.1)
Basophils Relative: 1 %
Eosinophils Absolute: 0 10*3/uL (ref 0.0–0.5)
Eosinophils Relative: 1 %
HCT: 32.2 % — ABNORMAL LOW (ref 39.0–52.0)
Hemoglobin: 10.5 g/dL — ABNORMAL LOW (ref 13.0–17.0)
Immature Granulocytes: 1 %
Lymphocytes Relative: 34 %
Lymphs Abs: 2.1 10*3/uL (ref 0.7–4.0)
MCH: 29.5 pg (ref 26.0–34.0)
MCHC: 32.6 g/dL (ref 30.0–36.0)
MCV: 90.4 fL (ref 80.0–100.0)
Monocytes Absolute: 0.5 10*3/uL (ref 0.1–1.0)
Monocytes Relative: 9 %
Neutro Abs: 3.4 10*3/uL (ref 1.7–7.7)
Neutrophils Relative %: 54 %
Platelet Count: 429 10*3/uL — ABNORMAL HIGH (ref 150–400)
RBC: 3.56 MIL/uL — ABNORMAL LOW (ref 4.22–5.81)
RDW: 16 % — ABNORMAL HIGH (ref 11.5–15.5)
WBC Count: 6.1 10*3/uL (ref 4.0–10.5)
nRBC: 0 % (ref 0.0–0.2)

## 2019-08-12 LAB — MAGNESIUM: Magnesium: 1.8 mg/dL (ref 1.7–2.4)

## 2019-08-12 MED ORDER — SODIUM CHLORIDE 0.9 % IV SOLN
800.0000 mg/m2 | Freq: Once | INTRAVENOUS | Status: AC
  Administered 2019-08-12: 1444 mg via INTRAVENOUS
  Filled 2019-08-12: qty 37.98

## 2019-08-12 MED ORDER — HEPARIN SOD (PORK) LOCK FLUSH 100 UNIT/ML IV SOLN
500.0000 [IU] | Freq: Once | INTRAVENOUS | Status: AC | PRN
  Administered 2019-08-12: 16:00:00 500 [IU]
  Filled 2019-08-12: qty 5

## 2019-08-12 MED ORDER — SODIUM CHLORIDE 0.9 % IV SOLN
Freq: Once | INTRAVENOUS | Status: AC
  Administered 2019-08-12: 11:00:00 via INTRAVENOUS
  Filled 2019-08-12: qty 5

## 2019-08-12 MED ORDER — SODIUM CHLORIDE 0.9 % IV SOLN
25.0000 mg/m2 | Freq: Once | INTRAVENOUS | Status: AC
  Administered 2019-08-12: 13:00:00 45 mg via INTRAVENOUS
  Filled 2019-08-12: qty 45

## 2019-08-12 MED ORDER — SODIUM CHLORIDE 0.9 % IV SOLN
Freq: Once | INTRAVENOUS | Status: AC
  Administered 2019-08-12: 09:00:00 via INTRAVENOUS
  Filled 2019-08-12: qty 1000

## 2019-08-12 MED ORDER — PALONOSETRON HCL INJECTION 0.25 MG/5ML
INTRAVENOUS | Status: AC
  Filled 2019-08-12: qty 5

## 2019-08-12 MED ORDER — SODIUM CHLORIDE 0.9% FLUSH
10.0000 mL | INTRAVENOUS | Status: DC | PRN
  Administered 2019-08-12: 16:00:00 10 mL
  Filled 2019-08-12: qty 10

## 2019-08-12 MED ORDER — SODIUM CHLORIDE 0.9 % IV SOLN
Freq: Once | INTRAVENOUS | Status: AC
  Administered 2019-08-12: 09:00:00 via INTRAVENOUS
  Filled 2019-08-12: qty 250

## 2019-08-12 MED ORDER — PALONOSETRON HCL INJECTION 0.25 MG/5ML
0.2500 mg | Freq: Once | INTRAVENOUS | Status: AC
  Administered 2019-08-12: 0.25 mg via INTRAVENOUS

## 2019-08-12 NOTE — Patient Instructions (Signed)
[facility] Discharge Instructions for Patients Receiving Chemotherapy  Today you received the following chemotherapy agents Gemzar, Cisplatin  To help prevent nausea and vomiting after your treatment, we encourage you to take your nausea medication as directed   If you develop nausea and vomiting that is not controlled by your nausea medication, call the clinic.   BELOW ARE SYMPTOMS THAT SHOULD BE REPORTED IMMEDIATELY:  *FEVER GREATER THAN 100.5 F  *CHILLS WITH OR WITHOUT FEVER  NAUSEA AND VOMITING THAT IS NOT CONTROLLED WITH YOUR NAUSEA MEDICATION  *UNUSUAL SHORTNESS OF BREATH  *UNUSUAL BRUISING OR BLEEDING  TENDERNESS IN MOUTH AND THROAT WITH OR WITHOUT PRESENCE OF ULCERS  *URINARY PROBLEMS  *BOWEL PROBLEMS  UNUSUAL RASH Items with * indicate a potential emergency and should be followed up as soon as possible.  Feel free to call the clinic should you have any questions or concerns. The clinic phone number is [phone].  Please show the CHEMO ALERT CARD at check-in to the Emergency Department and triage nurse.   

## 2019-08-13 ENCOUNTER — Other Ambulatory Visit: Payer: Medicare Other

## 2019-08-13 ENCOUNTER — Ambulatory Visit: Payer: Medicare Other

## 2019-08-14 ENCOUNTER — Inpatient Hospital Stay: Payer: Medicare Other

## 2019-08-14 VITALS — BP 159/85 | HR 81 | Temp 97.8°F

## 2019-08-14 DIAGNOSIS — Z5111 Encounter for antineoplastic chemotherapy: Secondary | ICD-10-CM | POA: Diagnosis not present

## 2019-08-14 DIAGNOSIS — C221 Intrahepatic bile duct carcinoma: Secondary | ICD-10-CM | POA: Diagnosis not present

## 2019-08-14 DIAGNOSIS — R0600 Dyspnea, unspecified: Secondary | ICD-10-CM | POA: Diagnosis not present

## 2019-08-14 DIAGNOSIS — R531 Weakness: Secondary | ICD-10-CM | POA: Diagnosis not present

## 2019-08-14 DIAGNOSIS — R5383 Other fatigue: Secondary | ICD-10-CM | POA: Diagnosis not present

## 2019-08-14 DIAGNOSIS — R112 Nausea with vomiting, unspecified: Secondary | ICD-10-CM | POA: Diagnosis not present

## 2019-08-14 MED ORDER — PEGFILGRASTIM-CBQV 6 MG/0.6ML ~~LOC~~ SOSY
6.0000 mg | PREFILLED_SYRINGE | Freq: Once | SUBCUTANEOUS | Status: AC
  Administered 2019-08-14: 6 mg via SUBCUTANEOUS

## 2019-08-14 NOTE — Patient Instructions (Signed)

## 2019-08-19 ENCOUNTER — Other Ambulatory Visit: Payer: Self-pay | Admitting: Hematology

## 2019-08-19 DIAGNOSIS — C221 Intrahepatic bile duct carcinoma: Secondary | ICD-10-CM

## 2019-08-24 ENCOUNTER — Other Ambulatory Visit: Payer: Self-pay | Admitting: Hematology

## 2019-08-24 NOTE — Progress Notes (Signed)
[facility]   [phone] [phone]   Clinic Follow up Note   Patient Care Team: [name], DO as PCP - General (Family Medicine) 08/25/2019  CHIEF COMPLAINT: F/u extrahepatic cholangiocarcinoma   SUMMARY OF ONCOLOGIC HISTORY: Oncology History  Cholangiocarcinoma ([place])  01/14/2019 Imaging   US Abdomen 01/14/19  IMPRESSION: There is significant intrahepatic and extrahepatic biliary dilatation present, concerning for distal common bile duct obstruction. Further evaluation with CT scan or MRCP is recommended. Correlation with liver function tests is recommended as well. These results will be called to the ordering clinician or representative by the Radiologist Assistant, and communication documented in the PACS or zVision Dashboard.   Probable large amount of sludge seen within gallbladder lumen with mild gallbladder wall thickening. However, no cholelithiasis, pericholecystic fluid or sonographic Murphy's sign is noted.   4.2 cm septated cyst seen in upper pole of right kidney consistent with Bosniak type 2 lesion. Follow-up ultrasound in 1 year is recommended to ensure stability.   01/20/2019 Imaging   MRI abdomen 01/20/19  IMPRESSION: 1. Findings are highly concerning for central tumor in the biliary tract at the confluence of the common hepatic duct, cystic duct and common bile ducts. Further clinical evaluation for potential cholangiocarcinoma is strongly recommended. 2. Mild ductal dilatation of the main pancreatic duct throughout the distal body and tail of the pancreas where there is also some associated side branch ectasia. This may suggest a pancreatic ductal stricture. No obstructing pancreatic neoplasm identified. 3. Aortic atherosclerosis.   01/21/2019 Initial Biopsy   Diagnosis 01/21/19  BILE DUCT BRUSHING (SPECIMEN 1 OF 1 COLLECTED 01/21/2019) ADENOCARCINOMA.   01/21/2019 Procedure   ERCP By Dr hung 01/21/19 IMRPESSION  - The major papilla appeared normal. - A single localized biliary stricture was found in the middle third of the main bile duct. - The entire main bile duct and upper third of the main bile duct were dilated, secondary to a stricture. - A biliary sphincterotomy was performed. - Cells for cytology obtained in the middle third of the main bile duct. - One plastic stent was placed into the common bile duct.  EUS by Dr hung 01/21/19  IMPRESSION - There was dilation in the middle third of the main bile duct and in the upper third of the main bile duct which measured up to 15 mm. - There was a suggestion of a stricture in the middle third of the main bile duct. - No specimens collected.   02/03/2019 Imaging   CT CAP at [facility] 02/03/19  Impression:  1. There is mild intrahepatic and extrahepatic biliary ductal dilation and diffuse common bile duct wall thickening with stent in place. There is abnormal soft tissue measuring approximately 1.6 cm between the common hepatic artery, portal vein and common bile duct, worrisome for the known cholangiocarcinoma.  2. Periportal lymphadenopathy which may represent nodal metastasis.  4. Marked pancreatic atrophy and duct dilation involving the distal body and tail the pancreas where there is a coarse calcification. These findings are favored to represent stenosis from intraductal stones though an underlying stricture cannot be completely excluded.  5. Atypical symmetric prominent fat stranding of the bilateral lower abdominal wall. Correlate with surgical history or history of history of trauma.   03/08/2019 Initial Diagnosis   Cholangiocarcinoma ([place])   03/12/2019 -  Chemotherapy   gemcitabine and cisplatin on days 1 and 8 every 21 days starting 03/12/19. Abraxane added with cycle 2. Added GCSF [name]) to day 9 starting cycle  2.    03/19/2019 Cancer Staging   Staging form: Perihilar Bile Ducts, AJCC 8th Edition - Clinical: Stage Unknown (cTX, cN0,  cM0) - Signed by [name], MD on 03/19/2019   05/24/2019 Imaging   CT CAP W Contrast at [facility]  1.  Interval placement of covered metallic biliary stent. No progressive dilation of the biliary tree and resulting resolution of the prior main pancreatic duct dilation. 2.  Evidence of some subtle vascular contour deformity involving the hepatic artery, suspected to be from tumoral contact and/or posttreatment changes. 3.  New small right pleural effusion with overlying mild airspace disease. Secondary findings which may indicate a component of aspiration. 4.  No convincing evidence of new disseminated metastatic disease. 5.  Additional findings as discussed above.   06/18/2019 Imaging   CT AP IMPRESSION: 1.  No acute findings in the abdomen/pelvis. 2. Biliary stent in adequate position. No evidence of focal mass or adenopathy in the region of the pancreatic head or porta hepatis. 3. Moderate size right pleural effusion with associated right basilar atelectasis. 4. Possible single gallstone. Stable subcentimeter liver cyst. Stable right renal cysts. Small left inguinal hernia containing only peritoneal fat. 5. Aortic Atherosclerosis (ICD10-I70.0).   07/28/2019 Imaging   CT CAP IMPRESSION: 1. The patient is status post common bile duct stent placement, with unchanged stent position and patent appearance with pneumobilia. Primary cholangiocarcinoma is not discretely appreciated by CT. 2. No direct evidence of metastatic disease in the chest, abdomen, or pelvis. 3. Moderate right pleural effusion with associated atelectasis or consolidation, slightly improved compared to prior examination. 4. The pancreatic parenchyma is atrophic and calcified, particularly in the pancreatic tail. 5.  Coronary artery disease.  Aortic atherosclerosis     CURRENT THERAPY: Gemcitabine and cisplatin on days 1 and 8 every 21 daysstarting 03/12/19. Abraxane added with cycle 2.AddedGCSF ([name])to day 9  startingcycle 2.Chemo held since 06/03/19 due to poor toleration. Chemo restarted with Gem/Cis with C6 on 07/02/19.  INTERVAL HISTORY: Mr. Christopher Welch returns for f/u and treatment as scheduled. He completed cycle 7 gem/cisplatin on 11/6 and 08/12/19. He received [name] 08/14/19. He is more weak this week than usual, he describes as moderate. He rests more than half the day but can get up and do things, just lacks motivation. He is eating and drinking more. Denies fever or chills. Has had vomiting once daily x12 days. It starts with coughing, then retching, then "something comes up" usually clear liquid. Doesn't appear to be related to meals. Denies bilious our coffee ground emesis. He has intermittent stable nausea, takes zofran BID and compazine at least once. Anti-emetics seem to be less effective than usual. Abdominal pain is "same as always," occasional and mild. Does not take oxycodone. Managed with tylenol. Bowels move every couple of days which is his baseline. Cough and dyspnea remain mild, stable. Walked up from parking lot without difficulty.    MEDICAL HISTORY:  Past Medical History:  Diagnosis Date  . Diabetes mellitus type I, controlled ([place])   . Dyslipidemia   . ED (erectile dysfunction)   . Essential hypertension   . Gilbert's disease   . Hepatitis B carrier [facility])     SURGICAL HISTORY: Past Surgical History:  Procedure Laterality Date  . APPENDECTOMY  1964  . BILIARY BRUSHING  01/21/2019   Procedure: BILIARY BRUSHING;  Surgeon: Carol Ada, MD;  Location: [facility];  Service: Endoscopy;;  . BILIARY STENT PLACEMENT N/A 01/21/2019   Procedure: BILIARY STENT PLACEMENT;  Surgeon: Carol Ada,  MD;  Location: [facility];  Service: Endoscopy;  Laterality: N/A;  . ENDOSCOPIC RETROGRADE CHOLANGIOPANCREATOGRAPHY (ERCP) WITH PROPOFOL N/A 01/21/2019   Procedure: ENDOSCOPIC RETROGRADE CHOLANGIOPANCREATOGRAPHY (ERCP) WITH PROPOFOL;  Surgeon: Carol Ada, MD;  Location: [facility];   Service: Endoscopy;  Laterality: N/A;  . ESOPHAGOGASTRODUODENOSCOPY (EGD) WITH PROPOFOL N/A 01/21/2019   Procedure: ESOPHAGOGASTRODUODENOSCOPY (EGD) WITH PROPOFOL;  Surgeon: Carol Ada, MD;  Location: [facility];  Service: Endoscopy;  Laterality: N/A;  . FEMORAL HERNIA REPAIR    . IR CHOLANGIOGRAM EXISTING TUBE  05/13/2019  . IR CHOLANGIOGRAM EXISTING TUBE  06/08/2019  . IR EXCHANGE BILIARY DRAIN  05/18/2019  . IR EXCHANGE BILIARY DRAIN  06/08/2019  . IR IMAGING GUIDED PORT INSERTION  03/08/2019  . IR PERC CHOLECYSTOSTOMY  05/04/2019  . LEFT HEART CATH AND CORONARY ANGIOGRAPHY N/A 05/19/2017   Procedure: LEFT HEART CATH AND CORONARY ANGIOGRAPHY;  Surgeon: Leonie Man, MD;  Location: [facility];  Service: Cardiovascular;  Laterality: N/A;  . SPHINCTEROTOMY  01/21/2019   Procedure: SPHINCTEROTOMY;  Surgeon: Carol Ada, MD;  Location: [facility];  Service: Endoscopy;;  . UPPER ESOPHAGEAL ENDOSCOPIC ULTRASOUND (EUS) N/A 01/21/2019   Procedure: UPPER ESOPHAGEAL ENDOSCOPIC ULTRASOUND (EUS);  Surgeon: Carol Ada, MD;  Location: [facility];  Service: Endoscopy;  Laterality: N/A;    I have reviewed the social history and family history with the patient and they are unchanged from previous note.  ALLERGIES:  is allergic to erythromycin.  MEDICATIONS:  Current Outpatient Medications  Medication Sig Dispense Refill  . acetaminophen (TYLENOL) 500 MG tablet Take 1,000 mg by mouth 2 (two) times daily as needed for moderate pain or headache.    Marland Kitchen amLODipine (NORVASC) 5 MG tablet Take 5 mg by mouth daily.     Marland Kitchen atenolol (TENORMIN) 25 MG tablet Take 25 mg by mouth daily. Once a day     . atorvastatin (LIPITOR) 10 MG tablet Take 10 mg by mouth daily.    . Blood Glucose Monitoring Suppl (ACCU-CHEK NANO SMARTVIEW) W/DEVICE KIT 1 kit by Does not apply route 2 (two) times daily. (Patient taking differently: 1 kit by Does not apply route See admin instructions. Test blood sugars 12x's daily) 1 kit  0  . glucose blood test strip Test 3 times a day. (Patient taking differently: 1 each by Other route See admin instructions. Test 12 times a day.) 300 each Prn  . insulin NPH Human (NOVOLIN N) 100 UNIT/ML injection Inject 20 Units into the skin at bedtime.     . insulin regular (NOVOLIN R) 100 units/mL injection Inject 20 Units into the skin 3 (three) times daily before meals.     . lidocaine-prilocaine (EMLA) cream Apply to affected area once 30 g 3  . lisinopril (ZESTRIL) 20 MG tablet Take 20 mg by mouth daily.     . magnesium oxide (MAG-OX) 400 MG tablet Take 1 tablet (400 mg total) by mouth daily. 30 tablet 2  . metFORMIN (GLUCOPHAGE) 1000 MG tablet Take 2,000 mg by mouth every evening.     . ondansetron (ZOFRAN) 8 MG tablet TAKE 1 TABLET BY MOUTH 2 TIMES DAY AS NEEDED. START ON 3RD DAY AFTER CHEMOTHERAPY 30 tablet 1  . prochlorperazine (COMPAZINE) 10 MG tablet TAKE 1 TABLET (10 MG TOTAL) BY MOUTH EVERY 6 (SIX) HOURS AS NEEDED (NAUSEA OR VOMITING). 30 tablet 1  . zolpidem (AMBIEN) 5 MG tablet Take 1-2 tablets (5-10 mg total) by mouth at bedtime as needed for sleep. 60 tablet 0  .  oxycodone (OXY-IR) 5 MG capsule Take 1 capsule (5 mg total) by mouth every 6 (six) hours as needed for pain. 60 capsule 0   No current facility-administered medications for this visit.    Facility-Administered Medications Ordered in Other Visits  Medication Dose Route Frequency Provider Last Rate Last Dose  . heparin lock flush 100 unit/mL  500 Units Intracatheter Once PRN [name], MD      . sodium chloride flush (NS) 0.9 % injection 10 mL  10 mL Intracatheter PRN [name], MD        PHYSICAL EXAMINATION: ECOG PERFORMANCE STATUS: 2 - Symptomatic, <50% confined to bed  Vitals:   08/25/19 0822  BP: 138/77  Pulse: 82  Resp: 18  Temp: 98.2 F (36.8 C)  SpO2: 100%   Filed Weights   08/25/19 0822  Weight: 147 lb 11.2 oz (67 kg)    GENERAL:alert, no distress and comfortable SKIN: no rash  EYES:   sclera clear LYMPH:  no palpable cervical or supraclavicular lymphadenopathy LUNGS: clear with normal breathing effort HEART: regular rate & rhythm, no lower extremity edema ABDOMEN: abdomen soft, non-tender, quiet bowel sounds. No RUQ tenderness  NEURO: alert & oriented x 3 with fluent speech, no focal motor/sensory deficits PAC without erythema   LABORATORY DATA:  I have reviewed the data as listed CBC Latest Ref Rng & Units 08/25/2019 08/12/2019 08/05/2019  WBC 4.0 - 10.5 K/uL 8.8 6.1 9.8  Hemoglobin 13.0 - 17.0 g/dL 10.2(L) 10.5(L) 10.1(L)  Hematocrit 39.0 - 52.0 % 30.8(L) 32.2(L) 30.8(L)  Platelets 150 - 400 K/uL 253 429(H) 355     CMP Latest Ref Rng & Units 08/25/2019 08/12/2019 08/05/2019  Glucose 70 - 99 mg/dL 202(H) 253(H) 204(H)  BUN 8 - 23 mg/dL 16 19 19   Creatinine 0.61 - 1.24 mg/dL 1.32(H) 1.40(H) 1.34(H)  Sodium 135 - 145 mmol/L 132(L) 134(L) 134(L)  Potassium 3.5 - 5.1 mmol/L 4.2 4.8 4.3  Chloride 98 - 111 mmol/L 98 98 99  CO2 22 - 32 mmol/L 25 25 26   Calcium 8.9 - 10.3 mg/dL 8.9 9.0 8.9  Total Protein 6.5 - 8.1 g/dL 6.7 7.1 6.7  Total Bilirubin 0.3 - 1.2 mg/dL 0.2(L) 0.4 0.2(L)  Alkaline Phos 38 - 126 U/L 70 61 60  AST 15 - 41 U/L 15 14(L) 13(L)  ALT 0 - 44 U/L 13 15 13       RADIOGRAPHIC STUDIES: I have personally reviewed the radiological images as listed and agreed with the findings in the report. Dg Chest 2 View  Result Date: 08/25/2019 CLINICAL DATA:  Cough, shortness of breath. EXAM: CHEST - 2 VIEW COMPARISON:  June 23, 2019. FINDINGS: The heart size and mediastinal contours are within normal limits. Left lung is clear. No pneumothorax is noted. Right internal jugular Port-A-Cath is unchanged in position. Mild right pleural effusion is is noted with associated right basilar atelectasis. The visualized skeletal structures are unremarkable. IMPRESSION: Mild right pleural effusion with associated right basilar atelectasis. Electronically Signed   By:  Marijo Conception M.D.   On: 08/25/2019 09:25   Dg Abd 2 Views  Result Date: 08/25/2019 CLINICAL DATA:  Vomiting. EXAM: ABDOMEN - 2 VIEW COMPARISON:  July 28, 2019. FINDINGS: No abnormal bowel dilatation is noted. Large amount of stool seen throughout the colon. Biliary stent is noted. Surgical clips are noted in right upper quadrant. No free air is noted. IMPRESSION: Large amount of stool seen throughout the colon suggesting constipation. No abnormal bowel dilatation is noted.  Biliary stent is noted. Electronically Signed   By: Marijo Conception M.D.   On: 08/25/2019 09:23     ASSESSMENT & PLAN: Christopher Welch a 73 y.o.malewith   1. Extrahepatic cholangiocarcinoma, cTxN0M0 -He wasdiagnosedin 12/2018; presented withmalignant stricture and CBD obstruction required stenting. Brushing revealed adenocarcinoma. He underwent attempted whipple per Dr. Zenia Resides [facility] which was aborted due to vascular invasion -He was referred to our clinic to initiate chemotherapy in the neoadjuvant setting. -He proceeded with neoadjuvantgemcitabine and cisplatin on days 1 and 8 every 21 daysstarting 03/12/19. Abraxane added with cycle 2.AddedGCSF ([name])to day 9 startingcycle 2. -Hetolerated first few cycles very well without significant side effects;tumor marker CA 19.9dropped significantly as he started chemo, indicating good response to chemo treatment -his performance status decreased and abraxane was held with cycle 4  -He underwent restaging CT at request [facility] on 05/24/2019 which was overall stable, no new or progressive metastatic disease. Dr. Birdena Crandall to continue chemotherapy.  -He continued to tolerate treatment mostly well with mild to moderate fatigue and intermittent nausea, not much vomiting. He remains functional with rest periods. N/v is controlled with zofran and compazine. He recovers moderately well.  -Restaging CT and f/u with Dr. Carlis Abbott on 11/2 shows that the primary  tumor is not well visualized, and no evidence of metastatic disease in the chest, abdomen, or pelvis. Dr. Carlis Abbott and Dr. Burr Medico recommended to continue current chemo He will return to [facility] in 8 weeks. -Mr. Siegert appears stable today, he completed 7 cycles of cisplatin and gemcitabine. After cycle 6 he had increased vomiting after cough once daily x12 days and worsening fatigue. zofran and compazine are less effective. Exam shows decreased bowel sounds, otherwise benign, labs reassuring -chest xray shows stable right pleural effusion. KUB shows large amount of stool in the colon, no free air; stent was noted.  -his increased n/v may be related to constipation, I recommend aggressive bowel regimen with miralax BID until he achieves normal daily BM. He agrees. If n/v does not improve with normal BM, he will let me know. In that case will do more imaging.  -I recommend to increase activity to help with fatigue  -Labs adequate for treatment.  -Proceed with cycle 8 gem/cisplatin, no further dose modifications.  -return in 1 week for day 8 -f/u in 3 weeks with next cycle  2. Increased n/v -he has vomited once per day for last 12 days -not related to meal -labs reassuring, weight stable, VSS -KUB shows large amount of stool in the colon, no evidence of obstruction -recommend aggressive bowel regimen with miralax BID to achieve at least daily BM -he will continue anti-emetics, hydration. Does not need IV supportive care -if n/v does not improve with normal BM, will pursue further testing   3. Acute cholecystitis -Hospitalizedon 05/03/19; s/ppercutaneous gallbladder drainage tube placed by IR8/5/20. -completed antibiotics -tube exchanged 8/18 due to leakagewhich has resolved, his abdominal pain improved until it began leaking again on 9/8. IR could not exchange the tube as the gallbladder was completely decompressed and it was removed -[place] on 9/18 showed no evidence of cholecystitis -low suspicion for  recurrent infection today  4. Weight lossand low appetite -secondary to malignancyand chemo -he presented with 20 lbs weight loss  -We reviewed nutrition support withGlucerna -Continue to f/u with dietician -His appetitehas dropped significantly with recent cholecystitis.  -continues carnation and boost supplements -improved, overall stable   5. DM -managed by PCP Dr. Theda Sers -he has had DM for  20 years, he is compliant with regimen and knows how to adjust BG -on insulin, he knows how to adjust dose based on his BG level -BG >200 lately, he had recent f/u with PCP. Likely contributing to increased Cr. -continue monitoring, I enocuraged him to control BG and hydrate well   6. CAD, HL, HTN -followed by cardiologist Dr. Ellyn Hack -on amlodipine, atenolol, lisinopril, and statin -BP stable off lisinopril, Cr fluctuates   7. Social support -he is single, no children, he lives alone -He has a good friend whochecks on him -previously referred to [facility]  8.Insomnia -He is able to fall asleep but has trouble staying asleep -He has tried OTC Z-quil which only moderately help. -on Ambien PRN  PLAN: -Labs, CXR and KUB reviewed -Begin bowel regimen with mirlalax BID to achieve at least once daily BM -Patient will notify clinic if n/v does not improve with normal BM -Proceed with cycle 8 day 1 today  -Return for day 8 and inj next week  -F/u in 3 weeks with next cycle    Orders Placed This Encounter  Procedures  . DG Abd 2 Views    Standing Status:   Future    Number of Occurrences:   1    Standing Expiration Date:   08/24/2020    Order Specific Question:   Reason for Exam (SYMPTOM  OR DIAGNOSIS REQUIRED)    Answer:   daily vomiting x12 days, bile duct stent, cholangiocarcinoma    Order Specific Question:   Preferred imaging location?    Answer:   [facility] [place]    Order Specific Question:   Radiology Contrast Protocol - do NOT remove file path    Answer:    \\[email or web address].[email or web address]  . DG Chest 2 View    Standing Status:   Future    Number of Occurrences:   1    Standing Expiration Date:   08/24/2020    Order Specific Question:   Reason for Exam (SYMPTOM  OR DIAGNOSIS REQUIRED)    Answer:   daily cough, pleural effusion, cholangiocarcinoma on chemo    Order Specific Question:   Preferred imaging location?    Answer:   [facility]    Order Specific Question:   Radiology Contrast Protocol - do NOT remove file path    Answer:   \\[email or web address]   All questions were answered. The patient knows to call the clinic with any problems, questions or concerns. No barriers to learning was detected. I spent 20 minutes counseling the patient face to face. The total time spent in the appointment was 25 minutes and more than 50% was on counseling and review of test results     Alla Feeling, NP 08/25/19

## 2019-08-25 ENCOUNTER — Encounter: Payer: Self-pay | Admitting: Nurse Practitioner

## 2019-08-25 ENCOUNTER — Other Ambulatory Visit: Payer: Self-pay

## 2019-08-25 ENCOUNTER — Inpatient Hospital Stay: Payer: Medicare Other

## 2019-08-25 ENCOUNTER — Inpatient Hospital Stay (HOSPITAL_BASED_OUTPATIENT_CLINIC_OR_DEPARTMENT_OTHER): Payer: Medicare Other | Admitting: Nurse Practitioner

## 2019-08-25 ENCOUNTER — Ambulatory Visit (HOSPITAL_COMMUNITY)
Admission: RE | Admit: 2019-08-25 | Discharge: 2019-08-25 | Disposition: A | Discharge status: Home / Self Care | Payer: Medicare Other | Source: Ambulatory Visit | Attending: Nurse Practitioner | Admitting: Nurse Practitioner

## 2019-08-25 VITALS — BP 138/77 | HR 82 | Temp 98.2°F | Resp 18 | Ht 67.0 in | Wt 147.7 lb

## 2019-08-25 DIAGNOSIS — R112 Nausea with vomiting, unspecified: Secondary | ICD-10-CM

## 2019-08-25 DIAGNOSIS — Z5111 Encounter for antineoplastic chemotherapy: Secondary | ICD-10-CM | POA: Diagnosis not present

## 2019-08-25 DIAGNOSIS — C221 Intrahepatic bile duct carcinoma: Secondary | ICD-10-CM

## 2019-08-25 DIAGNOSIS — R5383 Other fatigue: Secondary | ICD-10-CM | POA: Diagnosis not present

## 2019-08-25 DIAGNOSIS — R1110 Vomiting, unspecified: Secondary | ICD-10-CM | POA: Diagnosis not present

## 2019-08-25 DIAGNOSIS — R05 Cough: Secondary | ICD-10-CM | POA: Diagnosis not present

## 2019-08-25 DIAGNOSIS — R0600 Dyspnea, unspecified: Secondary | ICD-10-CM | POA: Diagnosis not present

## 2019-08-25 DIAGNOSIS — R531 Weakness: Secondary | ICD-10-CM | POA: Diagnosis not present

## 2019-08-25 DIAGNOSIS — R059 Cough, unspecified: Secondary | ICD-10-CM

## 2019-08-25 DIAGNOSIS — Z95828 Presence of other vascular implants and grafts: Secondary | ICD-10-CM

## 2019-08-25 DIAGNOSIS — R0602 Shortness of breath: Secondary | ICD-10-CM | POA: Diagnosis not present

## 2019-08-25 LAB — CBC WITH DIFFERENTIAL (CANCER CENTER ONLY)
Abs Immature Granulocytes: 0.1 10*3/uL — ABNORMAL HIGH (ref 0.00–0.07)
Basophils Absolute: 0 10*3/uL (ref 0.0–0.1)
Basophils Relative: 1 %
Eosinophils Absolute: 0 10*3/uL (ref 0.0–0.5)
Eosinophils Relative: 1 %
HCT: 30.8 % — ABNORMAL LOW (ref 39.0–52.0)
Hemoglobin: 10.2 g/dL — ABNORMAL LOW (ref 13.0–17.0)
Immature Granulocytes: 1 %
Lymphocytes Relative: 24 %
Lymphs Abs: 2.1 10*3/uL (ref 0.7–4.0)
MCH: 30 pg (ref 26.0–34.0)
MCHC: 33.1 g/dL (ref 30.0–36.0)
MCV: 90.6 fL (ref 80.0–100.0)
Monocytes Absolute: 1.3 10*3/uL — ABNORMAL HIGH (ref 0.1–1.0)
Monocytes Relative: 15 %
Neutro Abs: 5.2 10*3/uL (ref 1.7–7.7)
Neutrophils Relative %: 58 %
Platelet Count: 253 10*3/uL (ref 150–400)
RBC: 3.4 MIL/uL — ABNORMAL LOW (ref 4.22–5.81)
RDW: 17.3 % — ABNORMAL HIGH (ref 11.5–15.5)
WBC Count: 8.8 10*3/uL (ref 4.0–10.5)
nRBC: 0 % (ref 0.0–0.2)

## 2019-08-25 LAB — CMP (CANCER CENTER ONLY)
ALT: 13 U/L (ref 0–44)
AST: 15 U/L (ref 15–41)
Albumin: 3.4 g/dL — ABNORMAL LOW (ref 3.5–5.0)
Alkaline Phosphatase: 70 U/L (ref 38–126)
Anion gap: 9 (ref 5–15)
BUN: 16 mg/dL (ref 8–23)
CO2: 25 mmol/L (ref 22–32)
Calcium: 8.9 mg/dL (ref 8.9–10.3)
Chloride: 98 mmol/L (ref 98–111)
Creatinine: 1.32 mg/dL — ABNORMAL HIGH (ref 0.61–1.24)
GFR, Est AFR Am: >60 mL/min (ref >60)
GFR, Estimated: 54 mL/min — ABNORMAL LOW (ref >60)
Glucose, Bld: 202 mg/dL — ABNORMAL HIGH (ref 70–99)
Potassium: 4.2 mmol/L (ref 3.5–5.1)
Sodium: 132 mmol/L — ABNORMAL LOW (ref 135–145)
Total Bilirubin: 0.2 mg/dL — ABNORMAL LOW (ref 0.3–1.2)
Total Protein: 6.7 g/dL (ref 6.5–8.1)

## 2019-08-25 LAB — MAGNESIUM: Magnesium: 1.8 mg/dL (ref 1.7–2.4)

## 2019-08-25 MED ORDER — HEPARIN SOD (PORK) LOCK FLUSH 100 UNIT/ML IV SOLN
500.0000 [IU] | Freq: Once | INTRAVENOUS | Status: AC | PRN
  Administered 2019-08-25: 500 [IU]
  Filled 2019-08-25: qty 5

## 2019-08-25 MED ORDER — SODIUM CHLORIDE 0.9 % IV SOLN
25.0000 mg/m2 | Freq: Once | INTRAVENOUS | Status: AC
  Administered 2019-08-25: 45 mg via INTRAVENOUS
  Filled 2019-08-25: qty 45

## 2019-08-25 MED ORDER — PALONOSETRON HCL INJECTION 0.25 MG/5ML
INTRAVENOUS | Status: AC
  Filled 2019-08-25: qty 5

## 2019-08-25 MED ORDER — SODIUM CHLORIDE 0.9 % IV SOLN
Freq: Once | INTRAVENOUS | Status: AC
  Administered 2019-08-25: 10:00:00 via INTRAVENOUS
  Filled 2019-08-25: qty 250

## 2019-08-25 MED ORDER — PALONOSETRON HCL INJECTION 0.25 MG/5ML
0.2500 mg | Freq: Once | INTRAVENOUS | Status: AC
  Administered 2019-08-25: 0.25 mg via INTRAVENOUS

## 2019-08-25 MED ORDER — SODIUM CHLORIDE 0.9 % IV SOLN
Freq: Once | INTRAVENOUS | Status: AC
  Administered 2019-08-25: 10:00:00 via INTRAVENOUS
  Filled 2019-08-25: qty 1000

## 2019-08-25 MED ORDER — SODIUM CHLORIDE 0.9% FLUSH
10.0000 mL | INTRAVENOUS | Status: DC | PRN
  Administered 2019-08-25: 10 mL
  Filled 2019-08-25: qty 10

## 2019-08-25 MED ORDER — SODIUM CHLORIDE 0.9 % IV SOLN
800.0000 mg/m2 | Freq: Once | INTRAVENOUS | Status: AC
  Administered 2019-08-25: 1444 mg via INTRAVENOUS
  Filled 2019-08-25: qty 37.98

## 2019-08-25 MED ORDER — SODIUM CHLORIDE 0.9 % IV SOLN
Freq: Once | INTRAVENOUS | Status: AC
  Administered 2019-08-25: 12:00:00 via INTRAVENOUS
  Filled 2019-08-25: qty 5

## 2019-08-25 NOTE — Patient Instructions (Signed)

## 2019-08-25 NOTE — Patient Instructions (Signed)
Cancer Center Discharge Instructions for Patients Receiving Chemotherapy  Today you received the following chemotherapy agents Gemzar; Cisplatin  To help prevent nausea and vomiting after your treatment, we encourage you to take your nausea medication as directed If you develop nausea and vomiting that is not controlled by your nausea medication, call the clinic.   BELOW ARE SYMPTOMS THAT SHOULD BE REPORTED IMMEDIATELY:  *FEVER GREATER THAN 100.5 F  *CHILLS WITH OR WITHOUT FEVER  NAUSEA AND VOMITING THAT IS NOT CONTROLLED WITH YOUR NAUSEA MEDICATION  *UNUSUAL SHORTNESS OF BREATH  *UNUSUAL BRUISING OR BLEEDING  TENDERNESS IN MOUTH AND THROAT WITH OR WITHOUT PRESENCE OF ULCERS  *URINARY PROBLEMS  *BOWEL PROBLEMS  UNUSUAL RASH Items with * indicate a potential emergency and should be followed up as soon as possible.  Feel free to call the clinic should you have any questions or concerns. The clinic phone number is [phone].  Please show the CHEMO ALERT CARD at check-in to the Emergency Department and triage nurse.   

## 2019-08-26 LAB — CANCER ANTIGEN 19-9: CA 19-9: 39 U/mL — ABNORMAL HIGH (ref 0–35)

## 2019-08-27 ENCOUNTER — Telehealth: Payer: Self-pay | Admitting: Nurse Practitioner

## 2019-08-27 NOTE — Telephone Encounter (Signed)
Scheduled per los. Called and left msg. Mailed printout  °

## 2019-09-02 ENCOUNTER — Inpatient Hospital Stay: Payer: Medicare Other

## 2019-09-02 ENCOUNTER — Inpatient Hospital Stay: Payer: Medicare Other | Attending: Hematology

## 2019-09-02 ENCOUNTER — Other Ambulatory Visit: Payer: Self-pay

## 2019-09-02 VITALS — BP 134/73 | HR 88 | Temp 98.2°F | Resp 20 | Wt 147.2 lb

## 2019-09-02 DIAGNOSIS — Z79899 Other long term (current) drug therapy: Secondary | ICD-10-CM | POA: Diagnosis not present

## 2019-09-02 DIAGNOSIS — Z5189 Encounter for other specified aftercare: Secondary | ICD-10-CM | POA: Diagnosis present

## 2019-09-02 DIAGNOSIS — E109 Type 1 diabetes mellitus without complications: Secondary | ICD-10-CM | POA: Diagnosis not present

## 2019-09-02 DIAGNOSIS — Z794 Long term (current) use of insulin: Secondary | ICD-10-CM | POA: Insufficient documentation

## 2019-09-02 DIAGNOSIS — Z5111 Encounter for antineoplastic chemotherapy: Secondary | ICD-10-CM | POA: Diagnosis present

## 2019-09-02 DIAGNOSIS — R5383 Other fatigue: Secondary | ICD-10-CM | POA: Insufficient documentation

## 2019-09-02 DIAGNOSIS — B181 Chronic viral hepatitis B without delta-agent: Secondary | ICD-10-CM | POA: Diagnosis not present

## 2019-09-02 DIAGNOSIS — I2510 Atherosclerotic heart disease of native coronary artery without angina pectoris: Secondary | ICD-10-CM | POA: Diagnosis not present

## 2019-09-02 DIAGNOSIS — R0600 Dyspnea, unspecified: Secondary | ICD-10-CM | POA: Insufficient documentation

## 2019-09-02 DIAGNOSIS — C221 Intrahepatic bile duct carcinoma: Secondary | ICD-10-CM | POA: Diagnosis not present

## 2019-09-02 DIAGNOSIS — R634 Abnormal weight loss: Secondary | ICD-10-CM | POA: Insufficient documentation

## 2019-09-02 DIAGNOSIS — E785 Hyperlipidemia, unspecified: Secondary | ICD-10-CM | POA: Diagnosis not present

## 2019-09-02 DIAGNOSIS — R05 Cough: Secondary | ICD-10-CM | POA: Insufficient documentation

## 2019-09-02 DIAGNOSIS — I10 Essential (primary) hypertension: Secondary | ICD-10-CM | POA: Insufficient documentation

## 2019-09-02 DIAGNOSIS — R109 Unspecified abdominal pain: Secondary | ICD-10-CM | POA: Diagnosis not present

## 2019-09-02 DIAGNOSIS — R110 Nausea: Secondary | ICD-10-CM | POA: Diagnosis not present

## 2019-09-02 DIAGNOSIS — Z95828 Presence of other vascular implants and grafts: Secondary | ICD-10-CM

## 2019-09-02 DIAGNOSIS — J90 Pleural effusion, not elsewhere classified: Secondary | ICD-10-CM | POA: Insufficient documentation

## 2019-09-02 LAB — CMP (CANCER CENTER ONLY)
ALT: 14 U/L (ref 0–44)
AST: 16 U/L (ref 15–41)
Albumin: 3.5 g/dL (ref 3.5–5.0)
Alkaline Phosphatase: 59 U/L (ref 38–126)
Anion gap: 9 (ref 5–15)
BUN: 21 mg/dL (ref 8–23)
CO2: 25 mmol/L (ref 22–32)
Calcium: 9.1 mg/dL (ref 8.9–10.3)
Chloride: 102 mmol/L (ref 98–111)
Creatinine: 1.26 mg/dL — ABNORMAL HIGH (ref 0.61–1.24)
GFR, Est AFR Am: >60 mL/min (ref >60)
GFR, Estimated: 57 mL/min — ABNORMAL LOW (ref >60)
Glucose, Bld: 112 mg/dL — ABNORMAL HIGH (ref 70–99)
Potassium: 4.2 mmol/L (ref 3.5–5.1)
Sodium: 136 mmol/L (ref 135–145)
Total Bilirubin: 0.2 mg/dL — ABNORMAL LOW (ref 0.3–1.2)
Total Protein: 7 g/dL (ref 6.5–8.1)

## 2019-09-02 LAB — CBC WITH DIFFERENTIAL (CANCER CENTER ONLY)
Abs Immature Granulocytes: 0.11 10*3/uL — ABNORMAL HIGH (ref 0.00–0.07)
Basophils Absolute: 0.1 10*3/uL (ref 0.0–0.1)
Basophils Relative: 1 %
Eosinophils Absolute: 0 10*3/uL (ref 0.0–0.5)
Eosinophils Relative: 0 %
HCT: 29.7 % — ABNORMAL LOW (ref 39.0–52.0)
Hemoglobin: 10 g/dL — ABNORMAL LOW (ref 13.0–17.0)
Immature Granulocytes: 1 %
Lymphocytes Relative: 23 %
Lymphs Abs: 2.2 10*3/uL (ref 0.7–4.0)
MCH: 29.9 pg (ref 26.0–34.0)
MCHC: 33.7 g/dL (ref 30.0–36.0)
MCV: 88.9 fL (ref 80.0–100.0)
Monocytes Absolute: 1.3 10*3/uL — ABNORMAL HIGH (ref 0.1–1.0)
Monocytes Relative: 14 %
Neutro Abs: 5.7 10*3/uL (ref 1.7–7.7)
Neutrophils Relative %: 61 %
Platelet Count: 249 10*3/uL (ref 150–400)
RBC: 3.34 MIL/uL — ABNORMAL LOW (ref 4.22–5.81)
RDW: 17 % — ABNORMAL HIGH (ref 11.5–15.5)
WBC Count: 9.4 10*3/uL (ref 4.0–10.5)
nRBC: 0 % (ref 0.0–0.2)

## 2019-09-02 LAB — MAGNESIUM: Magnesium: 1.9 mg/dL (ref 1.7–2.4)

## 2019-09-02 MED ORDER — SODIUM CHLORIDE 0.9 % IV SOLN
25.0000 mg/m2 | Freq: Once | INTRAVENOUS | Status: AC
  Administered 2019-09-02: 13:00:00 45 mg via INTRAVENOUS
  Filled 2019-09-02: qty 45

## 2019-09-02 MED ORDER — SODIUM CHLORIDE 0.9 % IV SOLN
800.0000 mg/m2 | Freq: Once | INTRAVENOUS | Status: AC
  Administered 2019-09-02: 1444 mg via INTRAVENOUS
  Filled 2019-09-02: qty 37.98

## 2019-09-02 MED ORDER — PALONOSETRON HCL INJECTION 0.25 MG/5ML
0.2500 mg | Freq: Once | INTRAVENOUS | Status: AC
  Administered 2019-09-02: 0.25 mg via INTRAVENOUS

## 2019-09-02 MED ORDER — SODIUM CHLORIDE 0.9% FLUSH
10.0000 mL | INTRAVENOUS | Status: DC | PRN
  Administered 2019-09-02: 10 mL
  Filled 2019-09-02: qty 10

## 2019-09-02 MED ORDER — SODIUM CHLORIDE 0.9 % IV SOLN
Freq: Once | INTRAVENOUS | Status: AC
  Administered 2019-09-02: 11:00:00 via INTRAVENOUS
  Filled 2019-09-02: qty 5

## 2019-09-02 MED ORDER — SODIUM CHLORIDE 0.9 % IV SOLN
Freq: Once | INTRAVENOUS | Status: AC
  Administered 2019-09-02: 09:00:00 via INTRAVENOUS
  Filled 2019-09-02: qty 250

## 2019-09-02 MED ORDER — HEPARIN SOD (PORK) LOCK FLUSH 100 UNIT/ML IV SOLN
500.0000 [IU] | Freq: Once | INTRAVENOUS | Status: AC | PRN
  Administered 2019-09-02: 500 [IU]
  Filled 2019-09-02: qty 5

## 2019-09-02 MED ORDER — SODIUM CHLORIDE 0.9 % IV SOLN
Freq: Once | INTRAVENOUS | Status: AC
  Administered 2019-09-02: 09:00:00 via INTRAVENOUS
  Filled 2019-09-02: qty 1000

## 2019-09-02 MED ORDER — PALONOSETRON HCL INJECTION 0.25 MG/5ML
INTRAVENOUS | Status: AC
  Filled 2019-09-02: qty 5

## 2019-09-02 NOTE — Patient Instructions (Signed)
[facility] Discharge Instructions for Patients Receiving Chemotherapy  Today you received the following chemotherapy agents Gemcitabine (GEMZAR) & Cisplatin (PLATINOL).  To help prevent nausea and vomiting after your treatment, we encourage you to take your nausea medication as prescribed.   If you develop nausea and vomiting that is not controlled by your nausea medication, call the clinic.   BELOW ARE SYMPTOMS THAT SHOULD BE REPORTED IMMEDIATELY:  *FEVER GREATER THAN 100.5 F  *CHILLS WITH OR WITHOUT FEVER  NAUSEA AND VOMITING THAT IS NOT CONTROLLED WITH YOUR NAUSEA MEDICATION  *UNUSUAL SHORTNESS OF BREATH  *UNUSUAL BRUISING OR BLEEDING  TENDERNESS IN MOUTH AND THROAT WITH OR WITHOUT PRESENCE OF ULCERS  *URINARY PROBLEMS  *BOWEL PROBLEMS  UNUSUAL RASH Items with * indicate a potential emergency and should be followed up as soon as possible.  Feel free to call the clinic should you have any questions or concerns. The clinic phone number is [phone].  Please show the CHEMO ALERT CARD at check-in to the Emergency Department and triage nurse.  Coronavirus (COVID-19) Are you at risk?  Are you at risk for the Coronavirus (COVID-19)?  To be considered HIGH RISK for Coronavirus (COVID-19), you have to meet the following criteria:  . Traveled to [place], [place], [place], [place] or [place]; or in the [place] to [place], [place], [place], or [place]; and have fever, cough, and shortness of breath within the last 2 weeks of travel OR . Been in close contact with a person diagnosed with COVID-19 within the last 2 weeks and have fever, cough, and shortness of breath . IF YOU DO NOT MEET THESE CRITERIA, YOU ARE CONSIDERED LOW RISK FOR COVID-19.  What to do if you are HIGH RISK for COVID-19?  . If you are having a medical emergency, call 911. . Seek medical care right away. Before you go to a doctor's office, urgent care or emergency department, call  ahead and tell them about your recent travel, contact with someone diagnosed with COVID-19, and your symptoms. You should receive instructions from your physician's office regarding next steps of care.  . When you arrive at healthcare provider, tell the healthcare staff immediately you have returned from visiting [place], [place], [place], [place] or [place]; or traveled in the [place] to [place], [place], [place], or [place]; in the last two weeks or you have been in close contact with a person diagnosed with COVID-19 in the last 2 weeks.   . Tell the health care staff about your symptoms: fever, cough and shortness of breath. . After you have been seen by a medical provider, you will be either: o Tested for (COVID-19) and discharged home on quarantine except to seek medical care if symptoms worsen, and asked to  - Stay home and avoid contact with others until you get your results (4-5 days)  - Avoid travel on public transportation if possible (such as bus, train, or airplane) or o Sent to the Emergency Department by EMS for evaluation, COVID-19 testing, and possible admission depending on your condition and test results.  What to do if you are LOW RISK for COVID-19?  Reduce your risk of any infection by using the same precautions used for avoiding the common cold or flu:  . Wash your hands often with soap and warm water for at least 20 seconds.  If soap and water are not readily available, use an alcohol-based hand sanitizer with at least 60% alcohol.  .   If coughing or sneezing, cover your mouth and nose by coughing or sneezing into the elbow areas of your shirt or coat, into a tissue or into your sleeve (not your hands). . Avoid shaking hands with others and consider head nods or verbal greetings only. . Avoid touching your eyes, nose, or mouth with unwashed hands.  . Avoid close contact with people who are sick. . Avoid places or events with large numbers of people in one location,  like concerts or sporting events. . Carefully consider travel plans you have or are making. . If you are planning any travel outside or inside the [place], visit the CDC's Travelers' Health webpage for the latest health notices. . If you have some symptoms but not all symptoms, continue to monitor at home and seek medical attention if your symptoms worsen. . If you are having a medical emergency, call 911.   ADDITIONAL HEALTHCARE OPTIONS FOR PATIENTS  [facility] / e-Visit: [email or web address]         [facility]: [phone]  [facility]: [phone]                   [facility]: [phone]    

## 2019-09-04 ENCOUNTER — Other Ambulatory Visit: Payer: Self-pay

## 2019-09-04 ENCOUNTER — Inpatient Hospital Stay: Payer: Medicare Other

## 2019-09-04 VITALS — BP 128/77 | HR 70 | Temp 98.3°F | Resp 18

## 2019-09-04 DIAGNOSIS — Z5189 Encounter for other specified aftercare: Secondary | ICD-10-CM | POA: Diagnosis not present

## 2019-09-04 DIAGNOSIS — C221 Intrahepatic bile duct carcinoma: Secondary | ICD-10-CM

## 2019-09-04 MED ORDER — PEGFILGRASTIM-CBQV 6 MG/0.6ML ~~LOC~~ SOSY
6.0000 mg | PREFILLED_SYRINGE | Freq: Once | SUBCUTANEOUS | Status: AC
  Administered 2019-09-04: 6 mg via SUBCUTANEOUS

## 2019-09-14 NOTE — Progress Notes (Signed)
[place]   Telephone:(336) [phone] Fax:(336) [phone]   Clinic Follow up Note   Patient Care Team: [name], DO as PCP - General (Family Medicine)  Date of Service:  09/16/2019  CHIEF COMPLAINT: F/u of ExtrahepaticCholangiocarcinoma   SUMMARY OF ONCOLOGIC HISTORY: Oncology History  Cholangiocarcinoma ([place])  01/14/2019 Imaging   US Abdomen 01/14/19  IMPRESSION: There is significant intrahepatic and extrahepatic biliary dilatation present, concerning for distal common bile duct obstruction. Further evaluation with CT scan or MRCP is recommended. Correlation with liver function tests is recommended as well. These results will be called to the ordering clinician or representative by the Radiologist Assistant, and communication documented in the PACS or zVision Dashboard.   Probable large amount of sludge seen within gallbladder lumen with mild gallbladder wall thickening. However, no cholelithiasis, pericholecystic fluid or sonographic Murphy's sign is noted.   4.2 cm septated cyst seen in upper pole of right kidney consistent with Bosniak type 2 lesion. Follow-up ultrasound in 1 year is recommended to ensure stability.   01/20/2019 Imaging   MRI abdomen 01/20/19  IMPRESSION: 1. Findings are highly concerning for central tumor in the biliary tract at the confluence of the common hepatic duct, cystic duct and common bile ducts. Further clinical evaluation for potential cholangiocarcinoma is strongly recommended. 2. Mild ductal dilatation of the main pancreatic duct throughout the distal body and tail of the pancreas where there is also some associated side branch ectasia. This may suggest a pancreatic ductal stricture. No obstructing pancreatic neoplasm identified. 3. Aortic atherosclerosis.   01/21/2019 Initial Biopsy   Diagnosis 01/21/19  BILE DUCT BRUSHING (SPECIMEN 1 OF 1 COLLECTED 01/21/2019) ADENOCARCINOMA.   01/21/2019 Procedure   ERCP By Dr  hung 01/21/19 IMRPESSION - The major papilla appeared normal. - A single localized biliary stricture was found in the middle third of the main bile duct. - The entire main bile duct and upper third of the main bile duct were dilated, secondary to a stricture. - A biliary sphincterotomy was performed. - Cells for cytology obtained in the middle third of the main bile duct. - One plastic stent was placed into the common bile duct.  EUS by Dr hung 01/21/19  IMPRESSION - There was dilation in the middle third of the main bile duct and in the upper third of the main bile duct which measured up to 15 mm. - There was a suggestion of a stricture in the middle third of the main bile duct. - No specimens collected.   02/03/2019 Imaging   [facility] 02/03/19  Impression:  1. There is mild intrahepatic and extrahepatic biliary ductal dilation and diffuse common bile duct wall thickening with stent in place. There is abnormal soft tissue measuring approximately 1.6 cm between the common hepatic artery, portal vein and common bile duct, worrisome for the known cholangiocarcinoma.  2. Periportal lymphadenopathy which may represent nodal metastasis.  4. Marked pancreatic atrophy and duct dilation involving the distal body and tail the pancreas where there is a coarse calcification. These findings are favored to represent stenosis from intraductal stones though an underlying stricture cannot be completely excluded.  5. Atypical symmetric prominent fat stranding of the bilateral lower abdominal wall. Correlate with surgical history or history of history of trauma.   03/08/2019 Initial Diagnosis   Cholangiocarcinoma ([place])   03/12/2019 -  Chemotherapy   gemcitabine and cisplatin on days 1 and 8 every 21 days starting 03/12/19. Abraxane added with cycle 2. Added GCSF [name])  to day 9 starting cycle 2.    03/19/2019 Cancer Staging   Staging form: Perihilar Bile Ducts, AJCC 8th Edition - Clinical:  Stage Unknown (cTX, cN0, cM0) - Signed by [name], MD on 03/19/2019   05/24/2019 Imaging   CT CAP [facility] at [facility]  1.  Interval placement of covered metallic biliary stent. No progressive dilation of the biliary tree and resulting resolution of the prior main pancreatic duct dilation. 2.  Evidence of some subtle vascular contour deformity involving the hepatic artery, suspected to be from tumoral contact and/or posttreatment changes. 3.  New small right pleural effusion with overlying mild airspace disease. Secondary findings which may indicate a component of aspiration. 4.  No convincing evidence of new disseminated metastatic disease. 5.  Additional findings as discussed above.   06/18/2019 Imaging   CT AP IMPRESSION: 1.  No acute findings in the abdomen/pelvis. 2. Biliary stent in adequate position. No evidence of focal mass or adenopathy in the region of the pancreatic head or porta hepatis. 3. Moderate size right pleural effusion with associated right basilar atelectasis. 4. Possible single gallstone. Stable subcentimeter liver cyst. Stable right renal cysts. Small left inguinal hernia containing only peritoneal fat. 5. Aortic Atherosclerosis (ICD10-I70.0).   07/28/2019 Imaging   CT CAP IMPRESSION: 1. The patient is status post common bile duct stent placement, with unchanged stent position and patent appearance with pneumobilia. Primary cholangiocarcinoma is not discretely appreciated by CT. 2. No direct evidence of metastatic disease in the chest, abdomen, or pelvis. 3. Moderate right pleural effusion with associated atelectasis or consolidation, slightly improved compared to prior examination. 4. The pancreatic parenchyma is atrophic and calcified, particularly in the pancreatic tail. 5.  Coronary artery disease.  Aortic atherosclerosis      CURRENT THERAPY:  gemcitabine and cisplatin on days 1 and 8 every 21 daysstarting 03/12/19. Abraxane added with cycle  2.AddedGCSF ([name])to day 9 startingcycle 2.Chemo held since 06/03/19 due to poor toleration. Chemo restarted with Gem/Cis with C6 on 07/02/19.  INTERVAL HISTORY:  Christopher Welch is here for a follow up and treatment. He presents to the clinic alone. He notes occasionally he has a cough that triggers nausea. Once he eats this improves. He denies any pain. He notes he gets tired very easily. He is not very active currently. He still able to do all activities he wants to. He feels his breathing is stable and adequate. He notes his BG at home is doing well.    REVIEW OF SYSTEMS:   Constitutional: Denies fevers, chills or abnormal weight loss (+) Fatigue Eyes: Denies blurriness of vision Ears, nose, mouth, throat, and face: Denies mucositis or sore throat Respiratory: Denies cough, dyspnea or wheezes Cardiovascular: Denies palpitation, chest discomfort or lower extremity swelling Gastrointestinal:  Denies nausea, heartburn or change in bowel habits Skin: Denies abnormal skin rashes Lymphatics: Denies new lymphadenopathy or easy bruising Neurological:Denies numbness, tingling or new weaknesses Behavioral/Psych: Mood is stable, no new changes  All other systems were reviewed with the patient and are negative.  MEDICAL HISTORY:  Past Medical History:  Diagnosis Date  . Diabetes mellitus type I, controlled ([place])   . Dyslipidemia   . ED (erectile dysfunction)   . Essential hypertension   . Gilbert's disease   . Hepatitis B carrier [facility])     SURGICAL HISTORY: Past Surgical History:  Procedure Laterality Date  . APPENDECTOMY  1964  . BILIARY BRUSHING  01/21/2019   Procedure: BILIARY BRUSHING;  Surgeon: Carol Ada, MD;  Location: [facility] ENDOSCOPY;  Service: Endoscopy;;  . BILIARY STENT PLACEMENT N/A 01/21/2019   Procedure: BILIARY STENT PLACEMENT;  Surgeon: Carol Ada, MD;  Location: [facility] ENDOSCOPY;  Service: Endoscopy;  Laterality: N/A;  . ENDOSCOPIC RETROGRADE CHOLANGIOPANCREATOGRAPHY  (ERCP) WITH PROPOFOL N/A 01/21/2019   Procedure: ENDOSCOPIC RETROGRADE CHOLANGIOPANCREATOGRAPHY (ERCP) WITH PROPOFOL;  Surgeon: Carol Ada, MD;  Location: [facility]OSCOPY;  Service: Endoscopy;  Laterality: N/A;  . ESOPHAGOGASTRODUODENOSCOPY (EGD) WITH PROPOFOL N/A 01/21/2019   Procedure: ESOPHAGOGASTRODUODENOSCOPY (EGD) WITH PROPOFOL;  Surgeon: Carol Ada, MD;  Location: [facility]OS[facility];  Service: Endoscopy;  Laterality: N/A;  . FEMORAL HERNIA REPAIR    . IR CHOLANGIOGRAM EXISTING TUBE  05/13/2019  . IR CHOLANGIOGRAM EXISTING TUBE  06/08/2019  . IR EXCHANGE BILIARY DRAIN  05/18/2019  . IR EXCHANGE BILIARY DRAIN  06/08/2019  . IR IMAGING GUIDED PORT INSERTION  03/08/2019  . IR PERC CHOLECYSTOSTOMY  05/04/2019  . LEFT HEART CATH AND CORONARY ANGIOGRAPHY N/A 05/19/2017   Procedure: LEFT HEART CATH AND CORONARY ANGIOGRAPHY;  Surgeon: Leonie Man, MD;  Location: [facility];  Service: Cardiovascular;  Laterality: N/A;  . SPHINCTEROTOMY  01/21/2019   Procedure: SPHINCTEROTOMY;  Surgeon: Carol Ada, MD;  Location: [facility];  Service: Endoscopy;;  . UPPER ESOPHAGEAL ENDOSCOPIC ULTRASOUND (EUS) N/A 01/21/2019   Procedure: UPPER ESOPHAGEAL ENDOSCOPIC ULTRASOUND (EUS);  Surgeon: Carol Ada, MD;  Location: [facility];  Service: Endoscopy;  Laterality: N/A;    I have reviewed the social history and family history with the patient and they are unchanged from previous note.  ALLERGIES:  is allergic to erythromycin.  MEDICATIONS:  Current Outpatient Medications  Medication Sig Dispense Refill  . acetaminophen (TYLENOL) 500 MG tablet Take 1,000 mg by mouth 2 (two) times daily as needed for moderate pain or headache.    Marland Kitchen amLODipine (NORVASC) 5 MG tablet Take 5 mg by mouth daily.     Marland Kitchen atenolol (TENORMIN) 25 MG tablet Take 25 mg by mouth daily. Once a day     . atorvastatin (LIPITOR) 10 MG tablet Take 10 mg by mouth daily.    . Blood Glucose Monitoring Suppl (ACCU-CHEK NANO SMARTVIEW) W/DEVICE  KIT 1 kit by Does not apply route 2 (two) times daily. (Patient taking differently: 1 kit by Does not apply route See admin instructions. Test blood sugars 12x's daily) 1 kit 0  . glucose blood test strip Test 3 times a day. (Patient taking differently: 1 each by Other route See admin instructions. Test 12 times a day.) 300 each Prn  . insulin NPH Human (NOVOLIN N) 100 UNIT/ML injection Inject 20 Units into the skin at bedtime.     . insulin regular (NOVOLIN R) 100 units/mL injection Inject 20 Units into the skin 3 (three) times daily before meals.     . lidocaine-prilocaine (EMLA) cream Apply to affected area once 30 g 3  . magnesium oxide (MAG-OX) 400 MG tablet Take 1 tablet (400 mg total) by mouth daily. 30 tablet 2  . metFORMIN (GLUCOPHAGE) 1000 MG tablet Take 2,000 mg by mouth every evening.     . ondansetron (ZOFRAN) 8 MG tablet TAKE 1 TABLET BY MOUTH 2 TIMES DAY AS NEEDED. START ON 3RD DAY AFTER CHEMOTHERAPY 30 tablet 1  . oxycodone (OXY-IR) 5 MG capsule Take 1 capsule (5 mg total) by mouth every 6 (six) hours as needed for pain. 60 capsule 0  . prochlorperazine (COMPAZINE) 10 MG tablet TAKE 1 TABLET (10 MG TOTAL) BY MOUTH EVERY 6 (SIX) HOURS AS  NEEDED (NAUSEA OR VOMITING). 30 tablet 1  . zolpidem (AMBIEN) 5 MG tablet Take 1-2 tablets (5-10 mg total) by mouth at bedtime as needed for sleep. 60 tablet 0  . lisinopril (ZESTRIL) 20 MG tablet Take 20 mg by mouth daily.      No current facility-administered medications for this visit.    PHYSICAL EXAMINATION: ECOG PERFORMANCE STATUS: 1 - Symptomatic but completely ambulatory  Vitals:   09/16/19 0836  BP: 140/80  Pulse: 73  Resp: 18  Temp: 98.1 F (36.7 C)  SpO2: 100%   Filed Weights   09/16/19 0836  Weight: 147 lb 4.8 oz (66.8 kg)    GENERAL:alert, no distress and comfortable SKIN: skin color, texture, turgor are normal, no rashes or significant lesions EYES: normal, Conjunctiva are pink and non-injected, sclera clear NECK:  supple, thyroid normal size, non-tender, without nodularity LYMPH:  no palpable lymphadenopathy in the cervical, axillary  LUNGS: clear to auscultation and percussion with normal breathing effort HEART: regular rate & rhythm and no murmurs and no lower extremity edema ABDOMEN:abdomen soft, non-tender and normal bowel sounds Musculoskeletal:no cyanosis of digits and no clubbing  NEURO: alert & oriented x 3 with fluent speech, no focal motor/sensory deficits  LABORATORY DATA:  I have reviewed the data as listed CBC Latest Ref Rng & Units 09/16/2019 09/02/2019 08/25/2019  WBC 4.0 - 10.5 K/uL 11.6(H) 9.4 8.8  Hemoglobin 13.0 - 17.0 g/dL 10.5(L) 10.0(L) 10.2(L)  Hematocrit 39.0 - 52.0 % 31.7(L) 29.7(L) 30.8(L)  Platelets 150 - 400 K/uL 299 249 253     CMP Latest Ref Rng & Units 09/16/2019 09/02/2019 08/25/2019  Glucose 70 - 99 mg/dL 158(H) 112(H) 202(H)  BUN 8 - 23 mg/dL 16 21 16   Creatinine 0.61 - 1.24 mg/dL 1.36(H) 1.26(H) 1.32(H)  Sodium 135 - 145 mmol/L 135 136 132(L)  Potassium 3.5 - 5.1 mmol/L 4.5 4.2 4.2  Chloride 98 - 111 mmol/L 99 102 98  CO2 22 - 32 mmol/L 26 25 25   Calcium 8.9 - 10.3 mg/dL 8.9 9.1 8.9  Total Protein 6.5 - 8.1 g/dL 6.9 7.0 6.7  Total Bilirubin 0.3 - 1.2 mg/dL 0.4 0.2(L) 0.2(L)  Alkaline Phos 38 - 126 U/L 77 59 70  AST 15 - 41 U/L 16 16 15   ALT 0 - 44 U/L 11 14 13       RADIOGRAPHIC STUDIES: I have personally reviewed the radiological images as listed and agreed with the findings in the report. No results found.   ASSESSMENT & PLAN:  TAKAI CHIARAMONTE is a 73 y.o. male with   1. Extrahepatic cholangiocarcinoma, cTxN0M0 -He wasdiagnosedin 12/2018. His work up revealed malignant stricture and CBD obstruction required stenting. Brushing revealed adenocarcinoma. His extrahepatic cholangiocarcinoma was not resectable due to vascular invasion(surgeon - Dr. Zenia Resides at [facility] started first line chemogemcitabine and cisplatin on days 1 and 8 every 21  dayson6/12/20. Abraxane added with cycle 2.AddedGCSF ([name])to day 9 startingcycle 2.Abraxane stopped after C4 and chemo held 06/03/19-07/01/09 due to poor toleration/hospitalization. He has improved with dose reduction  -He is clinically maintaining weight, with mild to moderate fatigue and mild dyspnea. Labs reviewed, and adequate to proceed with C9D1 Gem/Cis today.  -Plan for restaging scan in 09/2018  -f/u with NP Lacie in 3 weeks   2.History of acute cholecystitis -He washospitalizedon 05/03/19.  -He had a percutaneous gallbladder drainage tube placed by IR8/5/20.It was removed on 9/8 due to leakage  -Hiscurrent abdominal pain is probably related to the procedure, I encouraged him  to use pain medication as needed -No clinical high concern for acute cholecystitis we will continue to monitoring clinically  3.Right-sided pleural effusion -He underwent right thoracentesis with 2.1 is removed on 06/23/2019, cytologynegative.  The etiology is unclear, certainly malignancy is a high possibility. -He received a course of antibiotics  -His breathing has been improving lately. He still has mild pleural effusion on 08/25/19 Xray.   4. Weight lossand low appetite -secondary to malignancyand chemo, he presented with 20 lbs weight loss  -Continue to f/u with dietician -His appetitehas dropped significantly with recent cholecystitisand secondary to chemo due to nausea.  -I encouraged him tocontinuenutritional supplement (carnation breakfast and Boost)to 3-5 bottles aday. -With chemo dose reduction (no Abraxane) he has been able to maintain weight.   5. DM -not well controlled -on insulin, managed by PCP Dr. Theda Sers -Due to elevated BG secondary to chemo pre-meds, he has been needing to increase his insulin but working on controlling it.  6. CAD, HL, HTN -on amlodipine, atenolol, lisinopril, and statin. Continue to follow up with cardiologist Dr. Ellyn Hack -controlled and  stable   PLAN: -Labs reviewed and adequate to proceed with C9D1 Gem/Cis today at same dose -Labs flush, and chemo Cis/Gem next week -f/u in 3 weeks before next cycle chemo, will order restaging scan on next visit  -He will see Dr. Carlis Abbott on 12/28    No problem-specific Assessment & Plan notes found for this encounter.   No orders of the defined types were placed in this encounter.  All questions were answered. The patient knows to call the clinic with any problems, questions or concerns. No barriers to learning was detected. I spent 20 minutes counseling the patient face to face. The total time spent in the appointment was 25 minutes and more than 50% was on counseling and review of test results     [name], MD 09/16/2019   I, Joslyn Devon, am acting as scribe for [name], MD.   I have reviewed the above documentation for accuracy and completeness, and I agree with the above.

## 2019-09-16 ENCOUNTER — Inpatient Hospital Stay: Payer: Medicare Other

## 2019-09-16 ENCOUNTER — Encounter: Payer: Self-pay | Admitting: Hematology

## 2019-09-16 ENCOUNTER — Other Ambulatory Visit: Payer: Self-pay

## 2019-09-16 ENCOUNTER — Inpatient Hospital Stay (HOSPITAL_BASED_OUTPATIENT_CLINIC_OR_DEPARTMENT_OTHER): Payer: Medicare Other | Admitting: Hematology

## 2019-09-16 VITALS — BP 140/80 | HR 73 | Temp 98.1°F | Resp 18 | Ht 67.0 in | Wt 147.3 lb

## 2019-09-16 DIAGNOSIS — C221 Intrahepatic bile duct carcinoma: Secondary | ICD-10-CM

## 2019-09-16 DIAGNOSIS — I10 Essential (primary) hypertension: Secondary | ICD-10-CM | POA: Diagnosis not present

## 2019-09-16 DIAGNOSIS — Z5189 Encounter for other specified aftercare: Secondary | ICD-10-CM | POA: Diagnosis not present

## 2019-09-16 DIAGNOSIS — Z95828 Presence of other vascular implants and grafts: Secondary | ICD-10-CM

## 2019-09-16 LAB — CMP (CANCER CENTER ONLY)
ALT: 11 U/L (ref 0–44)
AST: 16 U/L (ref 15–41)
Albumin: 3.6 g/dL (ref 3.5–5.0)
Alkaline Phosphatase: 77 U/L (ref 38–126)
Anion gap: 10 (ref 5–15)
BUN: 16 mg/dL (ref 8–23)
CO2: 26 mmol/L (ref 22–32)
Calcium: 8.9 mg/dL (ref 8.9–10.3)
Chloride: 99 mmol/L (ref 98–111)
Creatinine: 1.36 mg/dL — ABNORMAL HIGH (ref 0.61–1.24)
GFR, Est AFR Am: >60 mL/min (ref >60)
GFR, Estimated: 52 mL/min — ABNORMAL LOW (ref >60)
Glucose, Bld: 158 mg/dL — ABNORMAL HIGH (ref 70–99)
Potassium: 4.5 mmol/L (ref 3.5–5.1)
Sodium: 135 mmol/L (ref 135–145)
Total Bilirubin: 0.4 mg/dL (ref 0.3–1.2)
Total Protein: 6.9 g/dL (ref 6.5–8.1)

## 2019-09-16 LAB — CBC WITH DIFFERENTIAL (CANCER CENTER ONLY)
Abs Immature Granulocytes: 0.08 10*3/uL — ABNORMAL HIGH (ref 0.00–0.07)
Basophils Absolute: 0 10*3/uL (ref 0.0–0.1)
Basophils Relative: 0 %
Eosinophils Absolute: 0.1 10*3/uL (ref 0.0–0.5)
Eosinophils Relative: 0 %
HCT: 31.7 % — ABNORMAL LOW (ref 39.0–52.0)
Hemoglobin: 10.5 g/dL — ABNORMAL LOW (ref 13.0–17.0)
Immature Granulocytes: 1 %
Lymphocytes Relative: 19 %
Lymphs Abs: 2.2 10*3/uL (ref 0.7–4.0)
MCH: 30.3 pg (ref 26.0–34.0)
MCHC: 33.1 g/dL (ref 30.0–36.0)
MCV: 91.4 fL (ref 80.0–100.0)
Monocytes Absolute: 1.6 10*3/uL — ABNORMAL HIGH (ref 0.1–1.0)
Monocytes Relative: 14 %
Neutro Abs: 7.6 10*3/uL (ref 1.7–7.7)
Neutrophils Relative %: 66 %
Platelet Count: 299 10*3/uL (ref 150–400)
RBC: 3.47 MIL/uL — ABNORMAL LOW (ref 4.22–5.81)
RDW: 17.2 % — ABNORMAL HIGH (ref 11.5–15.5)
WBC Count: 11.6 10*3/uL — ABNORMAL HIGH (ref 4.0–10.5)
nRBC: 0 % (ref 0.0–0.2)

## 2019-09-16 LAB — MAGNESIUM: Magnesium: 2 mg/dL (ref 1.7–2.4)

## 2019-09-16 MED ORDER — SODIUM CHLORIDE 0.9 % IV SOLN
Freq: Once | INTRAVENOUS | Status: AC
  Filled 2019-09-16: qty 1000

## 2019-09-16 MED ORDER — SODIUM CHLORIDE 0.9% FLUSH
10.0000 mL | INTRAVENOUS | Status: DC | PRN
  Administered 2019-09-16: 10 mL
  Filled 2019-09-16: qty 10

## 2019-09-16 MED ORDER — PALONOSETRON HCL INJECTION 0.25 MG/5ML
INTRAVENOUS | Status: AC
  Filled 2019-09-16: qty 5

## 2019-09-16 MED ORDER — SODIUM CHLORIDE 0.9 % IV SOLN
25.0000 mg/m2 | Freq: Once | INTRAVENOUS | Status: AC
  Administered 2019-09-16: 45 mg via INTRAVENOUS
  Filled 2019-09-16: qty 45

## 2019-09-16 MED ORDER — SODIUM CHLORIDE 0.9 % IV SOLN
Freq: Once | INTRAVENOUS | Status: AC
  Filled 2019-09-16: qty 250

## 2019-09-16 MED ORDER — SODIUM CHLORIDE 0.9 % IV SOLN
800.0000 mg/m2 | Freq: Once | INTRAVENOUS | Status: AC
  Administered 2019-09-16: 12:00:00 1444 mg via INTRAVENOUS
  Filled 2019-09-16: qty 37.98

## 2019-09-16 MED ORDER — PALONOSETRON HCL INJECTION 0.25 MG/5ML
0.2500 mg | Freq: Once | INTRAVENOUS | Status: AC
  Administered 2019-09-16: 0.25 mg via INTRAVENOUS

## 2019-09-16 MED ORDER — SODIUM CHLORIDE 0.9 % IV SOLN
Freq: Once | INTRAVENOUS | Status: AC
  Filled 2019-09-16: qty 5

## 2019-09-16 MED ORDER — HEPARIN SOD (PORK) LOCK FLUSH 100 UNIT/ML IV SOLN
500.0000 [IU] | Freq: Once | INTRAVENOUS | Status: AC | PRN
  Administered 2019-09-16: 500 [IU]
  Filled 2019-09-16: qty 5

## 2019-09-16 NOTE — Patient Instructions (Signed)
[facility] Discharge Instructions for Patients Receiving Chemotherapy  Today you received the following chemotherapy agents Gemcitabine (GEMZAR) & Cisplatin (PLATINOL).  To help prevent nausea and vomiting after your treatment, we encourage you to take your nausea medication as prescribed.   If you develop nausea and vomiting that is not controlled by your nausea medication, call the clinic.   BELOW ARE SYMPTOMS THAT SHOULD BE REPORTED IMMEDIATELY:  *FEVER GREATER THAN 100.5 F  *CHILLS WITH OR WITHOUT FEVER  NAUSEA AND VOMITING THAT IS NOT CONTROLLED WITH YOUR NAUSEA MEDICATION  *UNUSUAL SHORTNESS OF BREATH  *UNUSUAL BRUISING OR BLEEDING  TENDERNESS IN MOUTH AND THROAT WITH OR WITHOUT PRESENCE OF ULCERS  *URINARY PROBLEMS  *BOWEL PROBLEMS  UNUSUAL RASH Items with * indicate a potential emergency and should be followed up as soon as possible.  Feel free to call the clinic should you have any questions or concerns. The clinic phone number is [phone].  Please show the CHEMO ALERT CARD at check-in to the Emergency Department and triage nurse.  Coronavirus (COVID-19) Are you at risk?  Are you at risk for the Coronavirus (COVID-19)?  To be considered HIGH RISK for Coronavirus (COVID-19), you have to meet the following criteria:  . Traveled to [place], [place], [place], [place] or [place]; or in the [place] to [place], [place], [place], or [place]; and have fever, cough, and shortness of breath within the last 2 weeks of travel OR . Been in close contact with a person diagnosed with COVID-19 within the last 2 weeks and have fever, cough, and shortness of breath . IF YOU DO NOT MEET THESE CRITERIA, YOU ARE CONSIDERED LOW RISK FOR COVID-19.  What to do if you are HIGH RISK for COVID-19?  . If you are having a medical emergency, call 911. . Seek medical care right away. Before you go to a doctor's office, urgent care or emergency department, call  ahead and tell them about your recent travel, contact with someone diagnosed with COVID-19, and your symptoms. You should receive instructions from your physician's office regarding next steps of care.  . When you arrive at healthcare provider, tell the healthcare staff immediately you have returned from visiting [place], [place], [place], [place] or [place]; or traveled in the [place] to [place], [place], [place], or [place]; in the last two weeks or you have been in close contact with a person diagnosed with COVID-19 in the last 2 weeks.   . Tell the health care staff about your symptoms: fever, cough and shortness of breath. . After you have been seen by a medical provider, you will be either: o Tested for (COVID-19) and discharged home on quarantine except to seek medical care if symptoms worsen, and asked to  - Stay home and avoid contact with others until you get your results (4-5 days)  - Avoid travel on public transportation if possible (such as bus, train, or airplane) or o Sent to the Emergency Department by EMS for evaluation, COVID-19 testing, and possible admission depending on your condition and test results.  What to do if you are LOW RISK for COVID-19?  Reduce your risk of any infection by using the same precautions used for avoiding the common cold or flu:  . Wash your hands often with soap and warm water for at least 20 seconds.  If soap and water are not readily available, use an alcohol-based hand sanitizer with at least 60% alcohol.  .   If coughing or sneezing, cover your mouth and nose by coughing or sneezing into the elbow areas of your shirt or coat, into a tissue or into your sleeve (not your hands). . Avoid shaking hands with others and consider head nods or verbal greetings only. . Avoid touching your eyes, nose, or mouth with unwashed hands.  . Avoid close contact with people who are sick. . Avoid places or events with large numbers of people in one location,  like concerts or sporting events. . Carefully consider travel plans you have or are making. . If you are planning any travel outside or inside the [place], visit the CDC's Travelers' Health webpage for the latest health notices. . If you have some symptoms but not all symptoms, continue to monitor at home and seek medical attention if your symptoms worsen. . If you are having a medical emergency, call 911.   ADDITIONAL HEALTHCARE OPTIONS FOR PATIENTS  [facility] / e-Visit: [email or web address]         [facility]: [phone]  [facility]: [phone]                   [facility]: [phone]    

## 2019-09-17 ENCOUNTER — Encounter: Payer: Self-pay | Admitting: Hematology

## 2019-09-17 ENCOUNTER — Telehealth: Payer: Self-pay | Admitting: Hematology

## 2019-09-17 LAB — CANCER ANTIGEN 19-9: CA 19-9: 38 U/mL — ABNORMAL HIGH (ref 0–35)

## 2019-09-17 NOTE — Telephone Encounter (Signed)
No los per 12/17. 

## 2019-09-21 ENCOUNTER — Other Ambulatory Visit: Payer: Self-pay

## 2019-09-21 DIAGNOSIS — C221 Intrahepatic bile duct carcinoma: Secondary | ICD-10-CM

## 2019-09-23 ENCOUNTER — Inpatient Hospital Stay: Payer: Medicare Other

## 2019-09-23 ENCOUNTER — Other Ambulatory Visit: Payer: Self-pay

## 2019-09-23 VITALS — BP 135/84 | HR 82 | Temp 98.1°F | Resp 22

## 2019-09-23 DIAGNOSIS — C221 Intrahepatic bile duct carcinoma: Secondary | ICD-10-CM

## 2019-09-23 DIAGNOSIS — Z95828 Presence of other vascular implants and grafts: Secondary | ICD-10-CM

## 2019-09-23 DIAGNOSIS — Z5189 Encounter for other specified aftercare: Secondary | ICD-10-CM | POA: Diagnosis not present

## 2019-09-23 LAB — CMP (CANCER CENTER ONLY)
ALT: 16 U/L (ref 0–44)
AST: 16 U/L (ref 15–41)
Albumin: 3.3 g/dL — ABNORMAL LOW (ref 3.5–5.0)
Alkaline Phosphatase: 57 U/L (ref 38–126)
Anion gap: 10 (ref 5–15)
BUN: 20 mg/dL (ref 8–23)
CO2: 24 mmol/L (ref 22–32)
Calcium: 8.6 mg/dL — ABNORMAL LOW (ref 8.9–10.3)
Chloride: 100 mmol/L (ref 98–111)
Creatinine: 1.13 mg/dL (ref 0.61–1.24)
GFR, Est AFR Am: >60 mL/min (ref >60)
GFR, Estimated: >60 mL/min (ref >60)
Glucose, Bld: 150 mg/dL — ABNORMAL HIGH (ref 70–99)
Potassium: 4.1 mmol/L (ref 3.5–5.1)
Sodium: 134 mmol/L — ABNORMAL LOW (ref 135–145)
Total Bilirubin: 0.3 mg/dL (ref 0.3–1.2)
Total Protein: 6.6 g/dL (ref 6.5–8.1)

## 2019-09-23 LAB — CBC WITH DIFFERENTIAL (CANCER CENTER ONLY)
Abs Immature Granulocytes: 0.05 10*3/uL (ref 0.00–0.07)
Basophils Absolute: 0 10*3/uL (ref 0.0–0.1)
Basophils Relative: 0 %
Eosinophils Absolute: 0 10*3/uL (ref 0.0–0.5)
Eosinophils Relative: 0 %
HCT: 27.8 % — ABNORMAL LOW (ref 39.0–52.0)
Hemoglobin: 9.5 g/dL — ABNORMAL LOW (ref 13.0–17.0)
Immature Granulocytes: 1 %
Lymphocytes Relative: 23 %
Lymphs Abs: 1.8 10*3/uL (ref 0.7–4.0)
MCH: 30.3 pg (ref 26.0–34.0)
MCHC: 34.2 g/dL (ref 30.0–36.0)
MCV: 88.5 fL (ref 80.0–100.0)
Monocytes Absolute: 0.7 10*3/uL (ref 0.1–1.0)
Monocytes Relative: 9 %
Neutro Abs: 5.1 10*3/uL (ref 1.7–7.7)
Neutrophils Relative %: 67 %
Platelet Count: 194 10*3/uL (ref 150–400)
RBC: 3.14 MIL/uL — ABNORMAL LOW (ref 4.22–5.81)
RDW: 16.4 % — ABNORMAL HIGH (ref 11.5–15.5)
WBC Count: 7.7 10*3/uL (ref 4.0–10.5)
nRBC: 0 % (ref 0.0–0.2)

## 2019-09-23 MED ORDER — PALONOSETRON HCL INJECTION 0.25 MG/5ML
INTRAVENOUS | Status: AC
  Filled 2019-09-23: qty 5

## 2019-09-23 MED ORDER — HEPARIN SOD (PORK) LOCK FLUSH 100 UNIT/ML IV SOLN
500.0000 [IU] | Freq: Once | INTRAVENOUS | Status: AC | PRN
  Administered 2019-09-23: 500 [IU]
  Filled 2019-09-23: qty 5

## 2019-09-23 MED ORDER — SODIUM CHLORIDE 0.9 % IV SOLN
25.0000 mg/m2 | Freq: Once | INTRAVENOUS | Status: AC
  Administered 2019-09-23: 45 mg via INTRAVENOUS
  Filled 2019-09-23: qty 45

## 2019-09-23 MED ORDER — SODIUM CHLORIDE 0.9 % IV SOLN
Freq: Once | INTRAVENOUS | Status: AC
  Filled 2019-09-23: qty 250

## 2019-09-23 MED ORDER — SODIUM CHLORIDE 0.9% FLUSH
10.0000 mL | INTRAVENOUS | Status: DC | PRN
  Administered 2019-09-23: 10 mL
  Filled 2019-09-23: qty 10

## 2019-09-23 MED ORDER — SODIUM CHLORIDE 0.9 % IV SOLN
800.0000 mg/m2 | Freq: Once | INTRAVENOUS | Status: AC
  Administered 2019-09-23: 1444 mg via INTRAVENOUS
  Filled 2019-09-23: qty 37.98

## 2019-09-23 MED ORDER — PALONOSETRON HCL INJECTION 0.25 MG/5ML
0.2500 mg | Freq: Once | INTRAVENOUS | Status: AC
  Administered 2019-09-23: 0.25 mg via INTRAVENOUS

## 2019-09-23 MED ORDER — SODIUM CHLORIDE 0.9 % IV SOLN
Freq: Once | INTRAVENOUS | Status: AC
  Filled 2019-09-23: qty 5

## 2019-09-23 MED ORDER — SODIUM CHLORIDE 0.9 % IV SOLN
Freq: Once | INTRAVENOUS | Status: AC
  Filled 2019-09-23: qty 1000

## 2019-09-23 NOTE — Patient Instructions (Signed)
[facility] Discharge Instructions for Patients Receiving Chemotherapy  Today you received the following chemotherapy agents Gemcitabine (GEMZAR) & Cisplatin (PLATINOL).  To help prevent nausea and vomiting after your treatment, we encourage you to take your nausea medication as prescribed.   If you develop nausea and vomiting that is not controlled by your nausea medication, call the clinic.   BELOW ARE SYMPTOMS THAT SHOULD BE REPORTED IMMEDIATELY:  *FEVER GREATER THAN 100.5 F  *CHILLS WITH OR WITHOUT FEVER  NAUSEA AND VOMITING THAT IS NOT CONTROLLED WITH YOUR NAUSEA MEDICATION  *UNUSUAL SHORTNESS OF BREATH  *UNUSUAL BRUISING OR BLEEDING  TENDERNESS IN MOUTH AND THROAT WITH OR WITHOUT PRESENCE OF ULCERS  *URINARY PROBLEMS  *BOWEL PROBLEMS  UNUSUAL RASH Items with * indicate a potential emergency and should be followed up as soon as possible.  Feel free to call the clinic should you have any questions or concerns. The clinic phone number is [phone].  Please show the CHEMO ALERT CARD at check-in to the Emergency Department and triage nurse.  Coronavirus (COVID-19) Are you at risk?  Are you at risk for the Coronavirus (COVID-19)?  To be considered HIGH RISK for Coronavirus (COVID-19), you have to meet the following criteria:  . Traveled to [place], [place], [place], [place] or [place]; or in the [place] to [place], [place], [place], or [place]; and have fever, cough, and shortness of breath within the last 2 weeks of travel OR . Been in close contact with a person diagnosed with COVID-19 within the last 2 weeks and have fever, cough, and shortness of breath . IF YOU DO NOT MEET THESE CRITERIA, YOU ARE CONSIDERED LOW RISK FOR COVID-19.  What to do if you are HIGH RISK for COVID-19?  . If you are having a medical emergency, call 911. . Seek medical care right away. Before you go to a doctor's office, urgent care or emergency department, call  ahead and tell them about your recent travel, contact with someone diagnosed with COVID-19, and your symptoms. You should receive instructions from your physician's office regarding next steps of care.  . When you arrive at healthcare provider, tell the healthcare staff immediately you have returned from visiting [place], [place], [place], [place] or [place]; or traveled in the [place] to [place], [place], [place], or [place]; in the last two weeks or you have been in close contact with a person diagnosed with COVID-19 in the last 2 weeks.   . Tell the health care staff about your symptoms: fever, cough and shortness of breath. . After you have been seen by a medical provider, you will be either: o Tested for (COVID-19) and discharged home on quarantine except to seek medical care if symptoms worsen, and asked to  - Stay home and avoid contact with others until you get your results (4-5 days)  - Avoid travel on public transportation if possible (such as bus, train, or airplane) or o Sent to the Emergency Department by EMS for evaluation, COVID-19 testing, and possible admission depending on your condition and test results.  What to do if you are LOW RISK for COVID-19?  Reduce your risk of any infection by using the same precautions used for avoiding the common cold or flu:  . Wash your hands often with soap and warm water for at least 20 seconds.  If soap and water are not readily available, use an alcohol-based hand sanitizer with at least 60% alcohol.  .   If coughing or sneezing, cover your mouth and nose by coughing or sneezing into the elbow areas of your shirt or coat, into a tissue or into your sleeve (not your hands). . Avoid shaking hands with others and consider head nods or verbal greetings only. . Avoid touching your eyes, nose, or mouth with unwashed hands.  . Avoid close contact with people who are sick. . Avoid places or events with large numbers of people in one location,  like concerts or sporting events. . Carefully consider travel plans you have or are making. . If you are planning any travel outside or inside the [place], visit the CDC's Travelers' Health webpage for the latest health notices. . If you have some symptoms but not all symptoms, continue to monitor at home and seek medical attention if your symptoms worsen. . If you are having a medical emergency, call 911.   ADDITIONAL HEALTHCARE OPTIONS FOR PATIENTS   Telehealth / e-Visit: [email or web address]         [facility]: [phone]  [facility]: [phone]                   [facility]: [phone]    

## 2019-09-25 ENCOUNTER — Inpatient Hospital Stay: Payer: Medicare Other

## 2019-09-25 ENCOUNTER — Other Ambulatory Visit: Payer: Self-pay

## 2019-09-25 VITALS — BP 135/80 | HR 74 | Temp 97.9°F | Resp 18

## 2019-09-25 DIAGNOSIS — Z5189 Encounter for other specified aftercare: Secondary | ICD-10-CM | POA: Diagnosis not present

## 2019-09-25 DIAGNOSIS — C221 Intrahepatic bile duct carcinoma: Secondary | ICD-10-CM

## 2019-09-25 MED ORDER — PEGFILGRASTIM-CBQV 6 MG/0.6ML ~~LOC~~ SOSY
6.0000 mg | PREFILLED_SYRINGE | Freq: Once | SUBCUTANEOUS | Status: AC
  Administered 2019-09-25: 11:00:00 6 mg via SUBCUTANEOUS

## 2019-09-25 NOTE — Patient Instructions (Signed)

## 2019-10-03 ENCOUNTER — Encounter: Payer: Self-pay | Admitting: Hematology

## 2019-10-03 ENCOUNTER — Ambulatory Visit (HOSPITAL_COMMUNITY)
Admission: EM | Admit: 2019-10-03 | Discharge: 2019-10-03 | Disposition: A | Discharge status: Home / Self Care | Payer: Medicare Other | Attending: Physician Assistant | Admitting: Physician Assistant

## 2019-10-03 ENCOUNTER — Other Ambulatory Visit: Payer: Self-pay

## 2019-10-03 ENCOUNTER — Encounter (HOSPITAL_COMMUNITY): Payer: Self-pay

## 2019-10-03 DIAGNOSIS — R5383 Other fatigue: Secondary | ICD-10-CM | POA: Diagnosis not present

## 2019-10-03 DIAGNOSIS — R110 Nausea: Secondary | ICD-10-CM | POA: Diagnosis not present

## 2019-10-03 DIAGNOSIS — Z20822 Contact with and (suspected) exposure to covid-19: Secondary | ICD-10-CM | POA: Diagnosis not present

## 2019-10-03 DIAGNOSIS — R112 Nausea with vomiting, unspecified: Secondary | ICD-10-CM

## 2019-10-03 DIAGNOSIS — R05 Cough: Secondary | ICD-10-CM | POA: Insufficient documentation

## 2019-10-03 DIAGNOSIS — R059 Cough, unspecified: Secondary | ICD-10-CM

## 2019-10-03 LAB — POC SARS CORONAVIRUS 2 AG: SARS Coronavirus 2 Ag: NEGATIVE

## 2019-10-03 LAB — POC SARS CORONAVIRUS 2 AG -  ED: SARS Coronavirus 2 Ag: NEGATIVE

## 2019-10-03 NOTE — ED Triage Notes (Signed)
Patient presents to Urgent Care with complaints of covid exposure since five days ago. Patient reports he has been going through chemo for cancer in his bile duct, has had his right lung drained once already and with his chemo symptoms is not sure if he feels badly from chemo or if they are new covid sx.

## 2019-10-03 NOTE — Discharge Instructions (Signed)
Continue to Follow up with all of your current appointments as scheduled.  Go directly to the Emergency Department or call 911 if you have severe chest pain, shortness of breath, severe diarrhea or feel as though you might pass out.   If your Covid-19 test is positive, you will receive a phone call from [facility] regarding your results. Negative test results are not called. Both positive and negative results area always visible on MyChart. If you do not have a MyChart account, sign up instructions are in your discharge papers.   Persons who are directed to care for themselves at home may discontinue isolation under the following conditions:   At least 10 days have passed since symptom onset and  At least 24 hours have passed without running a fever (this means without the use of fever-reducing medications) and  Other symptoms have improved.  Persons infected with COVID-19 who never develop symptoms may discontinue isolation and other precautions 10 days after the date of their first positive COVID-19 test.

## 2019-10-03 NOTE — ED Provider Notes (Signed)
[facility]    CSN: [id number] Arrival date & time: 10/03/19  1014      History   Chief Complaint Chief Complaint  Patient presents with  . COVID Exposure    On Chemo    HPI Christopher Welch is a 74 y.o. male.   Patient with past medical history of cholangiocarcinoma with pancrease involvement on chemotherapy reports to urgent care today for concerns of a covid exposure 5 days ago and unsure whether symptoms are related to his chemotherapy or covid. He reports cough and shortness of breath that is at baseline for him and that he has had pleural effusions throughout the duration of his treatment. He states he has had these drained and that he does not feel he is at a point for needing this as his activity has not been limited. He also reports his baseline nausea without vomiting. He take zofran for this with good effect. He has not had diarrhea. He has not had fever. He reports general fatigue and not feeling well, but states this is also normal for him during chemotherapy. He currently has no chest pain and is not short of breath. He has no change in smell or taste. Denies headache.  He is schedule for a follow up on Thursday Jan 7th with the group that manages his pleural effusions and was most recently seen by his oncologist on 09/27/2019.   He also has a medical history of diabetes and hypertension.      Past Medical History:  Diagnosis Date  . Diabetes mellitus type I, controlled ([place])   . Dyslipidemia   . ED (erectile dysfunction)   . Essential hypertension   . Gilbert's disease   . Hepatitis B carrier [facility])     Patient Active Problem List   Diagnosis Date Noted  . Non-intractable vomiting with nausea 06/20/2019  . Abdominal pain 06/17/2019  . AKI (acute kidney injury) ([place]) 06/17/2019  . Hyperglycemia 06/17/2019  . Hyponatremia 06/17/2019  . Hyperkalemia 06/17/2019  . Acute cholecystitis 05/03/2019  . Thrombocytopenia ([place]) 05/03/2019  . Anemia  05/03/2019  . Port-A-Cath in place 03/19/2019  . Cholangiocarcinoma ([place]) 03/08/2019  . Goals of care, counseling/discussion 03/08/2019  . Abnormal cardiac CT angiography 05/19/2017  . Atypical angina ([place]) 04/23/2017  . Chest pain with moderate risk for cardiac etiology 04/17/2017  . Essential hypertension   . Dyslipidemia   . Insomnia 07/28/2015  . Hypoglycemia 04/14/2015  . Diabetic retinopathy, nonproliferative, mild ([place]) 12/05/2011  . DM2 (diabetes mellitus, type 2) ([place]) 01/12/2008  . Hyperlipidemia with target low density lipoprotein (LDL) cholesterol less than 70 mg/dL 01/12/2008  . HYPERTENSION 01/12/2008    Past Surgical History:  Procedure Laterality Date  . APPENDECTOMY  1964  . BILIARY BRUSHING  01/21/2019   Procedure: BILIARY BRUSHING;  Surgeon: Carol Ada, MD;  Location: [facility];  Service: Endoscopy;;  . BILIARY STENT PLACEMENT N/A 01/21/2019   Procedure: BILIARY STENT PLACEMENT;  Surgeon: Carol Ada, MD;  Location: [facility];  Service: Endoscopy;  Laterality: N/A;  . ENDOSCOPIC RETROGRADE CHOLANGIOPANCREATOGRAPHY (ERCP) WITH PROPOFOL N/A 01/21/2019   Procedure: ENDOSCOPIC RETROGRADE CHOLANGIOPANCREATOGRAPHY (ERCP) WITH PROPOFOL;  Surgeon: Carol Ada, MD;  Location: [facility];  Service: Endoscopy;  Laterality: N/A;  . ESOPHAGOGASTRODUODENOSCOPY (EGD) WITH PROPOFOL N/A 01/21/2019   Procedure: ESOPHAGOGASTRODUODENOSCOPY (EGD) WITH PROPOFOL;  Surgeon: Carol Ada, MD;  Location: [facility];  Service: Endoscopy;  Laterality: N/A;  . FEMORAL HERNIA REPAIR    . IR CHOLANGIOGRAM EXISTING TUBE  05/13/2019  .  IR CHOLANGIOGRAM EXISTING TUBE  06/08/2019  . IR EXCHANGE BILIARY DRAIN  05/18/2019  . IR EXCHANGE BILIARY DRAIN  06/08/2019  . IR IMAGING GUIDED PORT INSERTION  03/08/2019  . IR PERC CHOLECYSTOSTOMY  05/04/2019  . LEFT HEART CATH AND CORONARY ANGIOGRAPHY N/A 05/19/2017   Procedure: LEFT HEART CATH AND CORONARY ANGIOGRAPHY;  Surgeon: Leonie Man, MD;   Location: [facility];  Service: Cardiovascular;  Laterality: N/A;  . SPHINCTEROTOMY  01/21/2019   Procedure: SPHINCTEROTOMY;  Surgeon: Carol Ada, MD;  Location: [facility];  Service: Endoscopy;;  . UPPER ESOPHAGEAL ENDOSCOPIC ULTRASOUND (EUS) N/A 01/21/2019   Procedure: UPPER ESOPHAGEAL ENDOSCOPIC ULTRASOUND (EUS);  Surgeon: Carol Ada, MD;  Location: [facility];  Service: Endoscopy;  Laterality: N/A;       Home Medications    Prior to Admission medications   Medication Sig Start Date End Date Taking? Authorizing Provider  acetaminophen (TYLENOL) 500 MG tablet Take 1,000 mg by mouth 2 (two) times daily as needed for moderate pain or headache.    [provider]  amLODipine (NORVASC) 5 MG tablet Take 5 mg by mouth daily.     [provider]  atenolol (TENORMIN) 25 MG tablet Take 25 mg by mouth daily. Once a day     [provider]  atorvastatin (LIPITOR) 10 MG tablet Take 10 mg by mouth daily. 05/13/17   [provider]  Blood Glucose Monitoring Suppl (ACCU-CHEK NANO SMARTVIEW) W/DEVICE KIT 1 kit by Does not apply route 2 (two) times daily. Patient taking differently: 1 kit by Does not apply route See admin instructions. Test blood sugars 12x's daily 10/23/12   Denita Lung, MD  glucose blood test strip Test 3 times a day. Patient taking differently: 1 each by Other route See admin instructions. Test 12 times a day. 10/26/12   Denita Lung, MD  insulin NPH Human (NOVOLIN N) 100 UNIT/ML injection Inject 20 Units into the skin at bedtime.     [provider]  insulin regular (NOVOLIN R) 100 units/mL injection Inject 20 Units into the skin 3 (three) times daily before meals.     [provider]  lidocaine-prilocaine (EMLA) cream Apply to affected area once 03/08/19   [name], MD  lisinopril (ZESTRIL) 20 MG tablet Take 20 mg by mouth daily.  03/14/17   [provider]  magnesium oxide (MAG-OX) 400 MG tablet Take 1  tablet (400 mg total) by mouth daily. 08/24/19   [name], MD  metFORMIN (GLUCOPHAGE) 1000 MG tablet Take 2,000 mg by mouth every evening.     [provider]  ondansetron (ZOFRAN) 8 MG tablet TAKE 1 TABLET BY MOUTH 2 TIMES DAY AS NEEDED. START ON 3RD DAY AFTER CHEMOTHERAPY 08/19/19   [name], MD  oxycodone (OXY-IR) 5 MG capsule Take 1 capsule (5 mg total) by mouth every 6 (six) hours as needed for pain. 05/07/19   [name], MD  prochlorperazine (COMPAZINE) 10 MG tablet TAKE 1 TABLET (10 MG TOTAL) BY MOUTH EVERY 6 (SIX) HOURS AS NEEDED (NAUSEA OR VOMITING). 08/19/19   [name], MD  zolpidem (AMBIEN) 5 MG tablet Take 1-2 tablets (5-10 mg total) by mouth at bedtime as needed for sleep. 04/01/19   [name], MD  citalopram (CELEXA) 20 MG tablet Take 1 tablet (20 mg total) by mouth daily. 02/26/11 12/05/11  Denita Lung, MD  clonazePAM (KLONOPIN) 0.5 MG tablet Take 0.5 mg by mouth 2 (two) times daily as needed.  12/05/11  [provider]  lamoTRIgine (LAMICTAL) 100 MG tablet Take 100 mg by mouth daily. 1/2 TABLET QD   12/05/11  [provider]    Family History Family History  Problem Relation Age of Onset  . Diabetes Father     Social History Social History   Tobacco Use  . Smoking status: Never Smoker  . Smokeless tobacco: Never Used  Substance Use Topics  . Alcohol use: Yes    Alcohol/week: 7.0 standard drinks    Types: 7 Glasses of wine per week  . Drug use: No     Allergies   Erythromycin   Review of Systems Review of Systems  Constitutional: Positive for fatigue. Negative for chills and fever.  HENT: Negative for congestion, ear pain, sinus pressure, sinus pain and sore throat.   Eyes: Negative for pain and visual disturbance.  Respiratory: Positive for cough and shortness of breath.   Cardiovascular: Negative for chest pain and palpitations.  Gastrointestinal: Positive for nausea. Negative for abdominal pain, diarrhea and vomiting.    Genitourinary: Negative for dysuria and hematuria.  Musculoskeletal: Negative for arthralgias, back pain and myalgias.  Skin: Negative for color change and rash.  Neurological: Negative for dizziness, seizures, syncope, light-headedness and headaches.  Hematological: Negative for adenopathy.  All other systems reviewed and are negative.    Physical Exam Triage Vital Signs ED Triage Vitals  Enc Vitals Group     BP 10/03/19 1039 (!) 144/77     Pulse Rate 10/03/19 1039 78     Resp 10/03/19 1039 16     Temp 10/03/19 1039 98.3 F (36.8 C)     Temp Source 10/03/19 1039 Oral     SpO2 10/03/19 1039 98 %     Weight --      Height --      Head Circumference --      Peak Flow --      Pain Score 10/03/19 1044 0     Pain Loc --      Pain Edu? --      Excl. in [place]? --    No data found.  Updated Vital Signs BP (!) 144/77 (BP Location: Left Arm)   Pulse 78   Temp 98.3 F (36.8 C) (Oral)   Resp 16   SpO2 98%   Visual Acuity Right Eye Distance:   Left Eye Distance:   Bilateral Distance:    Right Eye Near:   Left Eye Near:    Bilateral Near:     Physical Exam Vitals and nursing note reviewed.  Constitutional:      General: He is not in acute distress.    Appearance: He is well-developed. He is not ill-appearing.     Comments: Patient no in distress or ill appearing but has a generally fatigue appearance   HENT:     Head: Normocephalic and atraumatic.     Nose: Nose normal. No congestion or rhinorrhea.     Mouth/Throat:     Mouth: Mucous membranes are moist.     Pharynx: Oropharynx is clear.  Eyes:     General: No scleral icterus.    Extraocular Movements: Extraocular movements intact.     Conjunctiva/sclera: Conjunctivae normal.     Pupils: Pupils are equal, round, and reactive to light.  Cardiovascular:     Rate and Rhythm: Normal rate and regular rhythm.     Heart sounds: No murmur. No friction rub. No gallop.   Pulmonary:     Effort:  Pulmonary effort is normal.  No respiratory distress.     Breath sounds: Normal breath sounds. No wheezing or rales.  Abdominal:     General: Bowel sounds are normal.     Palpations: Abdomen is soft.     Tenderness: There is no abdominal tenderness.  Musculoskeletal:     Cervical back: Neck supple.     Right lower leg: No edema.     Left lower leg: No edema.  Skin:    General: Skin is warm and dry.  Neurological:     Mental Status: He is alert and oriented to person, place, and time.  Psychiatric:        Mood and Affect: Mood normal.        Behavior: Behavior normal.        Thought Content: Thought content normal.        Judgment: Judgment normal.      UC Treatments / Results  Labs (all labs ordered are listed, but only abnormal results are displayed) Labs Reviewed  NOVEL CORONAVIRUS, NAA (HOSP ORDER, SEND-OUT TO REF LAB; TAT 18-24 HRS)  POC SARS CORONAVIRUS 2 AG -  ED    EKG   Radiology No results found.  Procedures Procedures (including critical care time)  Medications Ordered in UC Medications - No data to display  Initial Impression / Assessment and Plan / UC Course  I have reviewed the triage vital signs and the nursing notes.  Pertinent labs & imaging results that were available during my care of the patient were reviewed by me and considered in my medical decision making (see chart for details).     #Exposure to Covid #nausea #cough - Rapid covid done due to high risk and multiple co-morbidity, this was negative. Covid PCR sent. Believe he is at his baseline. He has treatment for nausea with zofran. Has close follow up with oncology team. Lungs sound clear on my exam and he is not complaining of worsening shortness of breath. Strict ED precautions discussed.    Final Clinical Impressions(s) / UC Diagnoses   Final diagnoses:  Exposure to COVID-19 virus  Encounter for laboratory testing for COVID-19 virus  Nausea without vomiting  Cough     Discharge Instructions      Continue to Follow up with all of your current appointments as scheduled.  Go directly to the Emergency Department or call 911 if you have severe chest pain, shortness of breath, severe diarrhea or feel as though you might pass out.   If your Covid-19 test is positive, you will receive a phone call from [facility] regarding your results. Negative test results are not called. Both positive and negative results area always visible on MyChart. If you do not have a MyChart account, sign up instructions are in your discharge papers.   Persons who are directed to care for themselves at home may discontinue isolation under the following conditions:  . At least 10 days have passed since symptom onset and . At least 24 hours have passed without running a fever (this means without the use of fever-reducing medications) and . Other symptoms have improved.  Persons infected with COVID-19 who never develop symptoms may discontinue isolation and other precautions 10 days after the date of their first positive COVID-19 test.     ED Prescriptions    None     PDMP not reviewed this encounter.   Purnell Shoemaker, PA-C 10/03/19 1129

## 2019-10-04 LAB — NOVEL CORONAVIRUS, NAA (HOSP ORDER, SEND-OUT TO REF LAB; TAT 18-24 HRS): SARS-CoV-2, NAA: NOT DETECTED

## 2019-10-05 ENCOUNTER — Encounter: Payer: Self-pay | Admitting: Hematology

## 2019-10-07 ENCOUNTER — Inpatient Hospital Stay: Payer: Medicare Other | Attending: Hematology | Admitting: Nurse Practitioner

## 2019-10-07 ENCOUNTER — Encounter: Payer: Self-pay | Admitting: Nurse Practitioner

## 2019-10-07 ENCOUNTER — Inpatient Hospital Stay: Payer: Medicare Other

## 2019-10-07 ENCOUNTER — Other Ambulatory Visit: Payer: Self-pay

## 2019-10-07 VITALS — BP 130/75 | HR 78 | Temp 97.9°F | Resp 18 | Ht 67.0 in | Wt 148.8 lb

## 2019-10-07 DIAGNOSIS — R109 Unspecified abdominal pain: Secondary | ICD-10-CM | POA: Insufficient documentation

## 2019-10-07 DIAGNOSIS — E109 Type 1 diabetes mellitus without complications: Secondary | ICD-10-CM | POA: Diagnosis not present

## 2019-10-07 DIAGNOSIS — Z95828 Presence of other vascular implants and grafts: Secondary | ICD-10-CM

## 2019-10-07 DIAGNOSIS — R05 Cough: Secondary | ICD-10-CM | POA: Insufficient documentation

## 2019-10-07 DIAGNOSIS — C221 Intrahepatic bile duct carcinoma: Secondary | ICD-10-CM

## 2019-10-07 DIAGNOSIS — I2510 Atherosclerotic heart disease of native coronary artery without angina pectoris: Secondary | ICD-10-CM | POA: Insufficient documentation

## 2019-10-07 DIAGNOSIS — K5900 Constipation, unspecified: Secondary | ICD-10-CM | POA: Diagnosis not present

## 2019-10-07 DIAGNOSIS — Z5111 Encounter for antineoplastic chemotherapy: Secondary | ICD-10-CM | POA: Insufficient documentation

## 2019-10-07 DIAGNOSIS — R5383 Other fatigue: Secondary | ICD-10-CM | POA: Insufficient documentation

## 2019-10-07 DIAGNOSIS — B181 Chronic viral hepatitis B without delta-agent: Secondary | ICD-10-CM | POA: Insufficient documentation

## 2019-10-07 DIAGNOSIS — G4700 Insomnia, unspecified: Secondary | ICD-10-CM | POA: Insufficient documentation

## 2019-10-07 DIAGNOSIS — Z5189 Encounter for other specified aftercare: Secondary | ICD-10-CM | POA: Diagnosis not present

## 2019-10-07 DIAGNOSIS — R110 Nausea: Secondary | ICD-10-CM | POA: Insufficient documentation

## 2019-10-07 DIAGNOSIS — Z79899 Other long term (current) drug therapy: Secondary | ICD-10-CM | POA: Insufficient documentation

## 2019-10-07 DIAGNOSIS — E785 Hyperlipidemia, unspecified: Secondary | ICD-10-CM | POA: Insufficient documentation

## 2019-10-07 DIAGNOSIS — I10 Essential (primary) hypertension: Secondary | ICD-10-CM | POA: Insufficient documentation

## 2019-10-07 DIAGNOSIS — Z794 Long term (current) use of insulin: Secondary | ICD-10-CM | POA: Insufficient documentation

## 2019-10-07 DIAGNOSIS — E860 Dehydration: Secondary | ICD-10-CM | POA: Diagnosis not present

## 2019-10-07 LAB — CMP (CANCER CENTER ONLY)
ALT: 16 U/L (ref 0–44)
AST: 14 U/L — ABNORMAL LOW (ref 15–41)
Albumin: 3.4 g/dL — ABNORMAL LOW (ref 3.5–5.0)
Alkaline Phosphatase: 70 U/L (ref 38–126)
Anion gap: 8 (ref 5–15)
BUN: 19 mg/dL (ref 8–23)
CO2: 25 mmol/L (ref 22–32)
Calcium: 8.8 mg/dL — ABNORMAL LOW (ref 8.9–10.3)
Chloride: 100 mmol/L (ref 98–111)
Creatinine: 1.37 mg/dL — ABNORMAL HIGH (ref 0.61–1.24)
GFR, Est AFR Am: 59 mL/min — ABNORMAL LOW (ref >60)
GFR, Estimated: 51 mL/min — ABNORMAL LOW (ref >60)
Glucose, Bld: 181 mg/dL — ABNORMAL HIGH (ref 70–99)
Potassium: 4.3 mmol/L (ref 3.5–5.1)
Sodium: 133 mmol/L — ABNORMAL LOW (ref 135–145)
Total Bilirubin: 0.3 mg/dL (ref 0.3–1.2)
Total Protein: 6.8 g/dL (ref 6.5–8.1)

## 2019-10-07 LAB — CBC WITH DIFFERENTIAL (CANCER CENTER ONLY)
Abs Immature Granulocytes: 0.06 10*3/uL (ref 0.00–0.07)
Basophils Absolute: 0 10*3/uL (ref 0.0–0.1)
Basophils Relative: 0 %
Eosinophils Absolute: 0 10*3/uL (ref 0.0–0.5)
Eosinophils Relative: 0 %
HCT: 27.9 % — ABNORMAL LOW (ref 39.0–52.0)
Hemoglobin: 9.2 g/dL — ABNORMAL LOW (ref 13.0–17.0)
Immature Granulocytes: 1 %
Lymphocytes Relative: 22 %
Lymphs Abs: 1.8 10*3/uL (ref 0.7–4.0)
MCH: 30.1 pg (ref 26.0–34.0)
MCHC: 33 g/dL (ref 30.0–36.0)
MCV: 91.2 fL (ref 80.0–100.0)
Monocytes Absolute: 1.5 10*3/uL — ABNORMAL HIGH (ref 0.1–1.0)
Monocytes Relative: 19 %
Neutro Abs: 4.6 10*3/uL (ref 1.7–7.7)
Neutrophils Relative %: 58 %
Platelet Count: 271 10*3/uL (ref 150–400)
RBC: 3.06 MIL/uL — ABNORMAL LOW (ref 4.22–5.81)
RDW: 16.6 % — ABNORMAL HIGH (ref 11.5–15.5)
WBC Count: 7.9 10*3/uL (ref 4.0–10.5)
nRBC: 0 % (ref 0.0–0.2)

## 2019-10-07 LAB — MAGNESIUM: Magnesium: 1.9 mg/dL (ref 1.7–2.4)

## 2019-10-07 MED ORDER — HEPARIN SOD (PORK) LOCK FLUSH 100 UNIT/ML IV SOLN
500.0000 [IU] | Freq: Once | INTRAVENOUS | Status: AC | PRN
  Administered 2019-10-07: 500 [IU]
  Filled 2019-10-07: qty 5

## 2019-10-07 MED ORDER — SODIUM CHLORIDE 0.9 % IV SOLN
Freq: Once | INTRAVENOUS | Status: AC
  Filled 2019-10-07: qty 1000

## 2019-10-07 MED ORDER — PALONOSETRON HCL INJECTION 0.25 MG/5ML
INTRAVENOUS | Status: AC
  Filled 2019-10-07: qty 5

## 2019-10-07 MED ORDER — SODIUM CHLORIDE 0.9% FLUSH
10.0000 mL | INTRAVENOUS | Status: DC | PRN
  Administered 2019-10-07: 10 mL
  Filled 2019-10-07: qty 10

## 2019-10-07 MED ORDER — SODIUM CHLORIDE 0.9 % IV SOLN
Freq: Once | INTRAVENOUS | Status: AC
  Filled 2019-10-07: qty 5

## 2019-10-07 MED ORDER — PALONOSETRON HCL INJECTION 0.25 MG/5ML
0.2500 mg | Freq: Once | INTRAVENOUS | Status: AC
  Administered 2019-10-07: 0.25 mg via INTRAVENOUS

## 2019-10-07 MED ORDER — SODIUM CHLORIDE 0.9 % IV SOLN
Freq: Once | INTRAVENOUS | Status: AC
  Filled 2019-10-07: qty 250

## 2019-10-07 MED ORDER — SODIUM CHLORIDE 0.9 % IV SOLN
800.0000 mg/m2 | Freq: Once | INTRAVENOUS | Status: AC
  Administered 2019-10-07: 1444 mg via INTRAVENOUS
  Filled 2019-10-07: qty 37.98

## 2019-10-07 MED ORDER — LORAZEPAM 0.5 MG PO TABS: 0.2500 mg | ORAL_TABLET | Freq: Three times a day (TID) | ORAL | 0 refills | Status: DC | PRN

## 2019-10-07 MED ORDER — SODIUM CHLORIDE 0.9 % IV SOLN
25.0000 mg/m2 | Freq: Once | INTRAVENOUS | Status: AC
  Administered 2019-10-07: 45 mg via INTRAVENOUS
  Filled 2019-10-07: qty 45

## 2019-10-07 NOTE — Progress Notes (Signed)
Christopher Welch   Telephone:([phone]) [phone] Fax:(336) [phone]   Clinic Follow up Note   Patient Care Team: [name], DO as PCP - General (Family Medicine) 10/07/2019  CHIEF COMPLAINT: Follow-up extrahepatic cholangiocarcinoma  SUMMARY OF ONCOLOGIC HISTORY: Oncology History  Cholangiocarcinoma ([place])  01/14/2019 Imaging   US Abdomen 01/14/19  IMPRESSION: There is significant intrahepatic and extrahepatic biliary dilatation present, concerning for distal common bile duct obstruction. Further evaluation with CT scan or MRCP is recommended. Correlation with liver function tests is recommended as well. These results will be called to the ordering clinician or representative by the Radiologist Assistant, and communication documented in the PACS or zVision Dashboard.   Probable large amount of sludge seen within gallbladder lumen with mild gallbladder wall thickening. However, no cholelithiasis, pericholecystic fluid or sonographic Murphy's sign is noted.   4.2 cm septated cyst seen in upper pole of right kidney consistent with Bosniak type 2 lesion. Follow-up ultrasound in 1 year is recommended to ensure stability.   01/20/2019 Imaging   MRI abdomen 01/20/19  IMPRESSION: 1. Findings are highly concerning for central tumor in the biliary tract at the confluence of the common hepatic duct, cystic duct and common bile ducts. Further clinical evaluation for potential cholangiocarcinoma is strongly recommended. 2. Mild ductal dilatation of the main pancreatic duct throughout the distal body and tail of the pancreas where there is also some associated side branch ectasia. This may suggest a pancreatic ductal stricture. No obstructing pancreatic neoplasm identified. 3. Aortic atherosclerosis.   01/21/2019 Initial Biopsy   Diagnosis 01/21/19  BILE DUCT BRUSHING (SPECIMEN 1 OF 1 COLLECTED 01/21/2019) ADENOCARCINOMA.   01/21/2019 Procedure   ERCP By Dr hung  01/21/19 IMRPESSION - The major papilla appeared normal. - A single localized biliary stricture was found in the middle third of the main bile duct. - The entire main bile duct and upper third of the main bile duct were dilated, secondary to a stricture. - A biliary sphincterotomy was performed. - Cells for cytology obtained in the middle third of the main bile duct. - One plastic stent was placed into the common bile duct.  EUS by Dr hung 01/21/19  IMPRESSION - There was dilation in the middle third of the main bile duct and in the upper third of the main bile duct which measured up to 15 mm. - There was a suggestion of a stricture in the middle third of the main bile duct. - No specimens collected.   02/03/2019 Imaging   CT CAP at [facility] 02/03/19  Impression:  1. There is mild intrahepatic and extrahepatic biliary ductal dilation and diffuse common bile duct wall thickening with stent in place. There is abnormal soft tissue measuring approximately 1.6 cm between the common hepatic artery, portal vein and common bile duct, worrisome for the known cholangiocarcinoma.  2. Periportal lymphadenopathy which may represent nodal metastasis.  4. Marked pancreatic atrophy and duct dilation involving the distal body and tail the pancreas where there is a coarse calcification. These findings are favored to represent stenosis from intraductal stones though an underlying stricture cannot be completely excluded.  5. Atypical symmetric prominent fat stranding of the bilateral lower abdominal wall. Correlate with surgical history or history of history of trauma.   03/08/2019 Initial Diagnosis   Cholangiocarcinoma ([place])   03/12/2019 -  Chemotherapy   gemcitabine and cisplatin on days 1 and 8 every 21 days starting 03/12/19. Abraxane added with cycle 2. Added GCSF [name]) to day 9 starting cycle 2.  03/19/2019 Cancer Staging   Staging form: Perihilar Bile Ducts, AJCC 8th Edition - Clinical:  Stage Unknown (cTX, cN0, cM0) - Signed by [name], MD on 03/19/2019   05/24/2019 Imaging   CT CAP W Contrast at [facility]  1.  Interval placement of covered metallic biliary stent. No progressive dilation of the biliary tree and resulting resolution of the prior main pancreatic duct dilation. 2.  Evidence of some subtle vascular contour deformity involving the hepatic artery, suspected to be from tumoral contact and/or posttreatment changes. 3.  New small right pleural effusion with overlying mild airspace disease. Secondary findings which may indicate a component of aspiration. 4.  No convincing evidence of new disseminated metastatic disease. 5.  Additional findings as discussed above.   06/18/2019 Imaging   CT AP IMPRESSION: 1.  No acute findings in the abdomen/pelvis. 2. Biliary stent in adequate position. No evidence of focal mass or adenopathy in the region of the pancreatic head or porta hepatis. 3. Moderate size right pleural effusion with associated right basilar atelectasis. 4. Possible single gallstone. Stable subcentimeter liver cyst. Stable right renal cysts. Small left inguinal hernia containing only peritoneal fat. 5. Aortic Atherosclerosis (ICD10-I70.0).   07/28/2019 Imaging   CT CAP IMPRESSION: 1. The patient is status post common bile duct stent placement, with unchanged stent position and patent appearance with pneumobilia. Primary cholangiocarcinoma is not discretely appreciated by CT. 2. No direct evidence of metastatic disease in the chest, abdomen, or pelvis. 3. Moderate right pleural effusion with associated atelectasis or consolidation, slightly improved compared to prior examination. 4. The pancreatic parenchyma is atrophic and calcified, particularly in the pancreatic tail. 5.  Coronary artery disease.  Aortic atherosclerosis     CURRENT THERAPY: gemcitabine and cisplatin on days 1 and 8 every 21 daysstarting 03/12/19. Abraxane added with cycle 2.AddedGCSF  ([name])to day 9 startingcycle 2.Chemo held since 06/03/19 due to poor toleration. Chemo restarted with Gem/Cis with C6 on 07/02/19.  INTERVAL HISTORY: Christopher Welch returns for follow-up and treatment as scheduled.  He was last seen on 12/24 and completed cycle 9 cis/gem.  He received [name] on 12/26.  He was seen by Dr. Carlis Abbott on 09/27/2019, who recommended restaging and televisit in 4 weeks to discuss results on 1/25. Pt went to urgent care on 10/03/2019 for Covid exposure although he was asymptomatic.  Tested negative by rapid and PCR tests.  Today, Christopher Welch presents to clinic by himself. He feels OK without specific complaints. Fatigue fluctuates but remains functional. Appetite is stable. Drinks 3 large classes of water per day. He has periodic nausea without vomiting daily, zofran and compazine help briefly. Nausea is worse on 3rd or 4th day after chemo. No c/d. Denies worsening abdominal pain. Takes percocet every 4-6 days if he has a pain flare. Dry cough is stable at baseline. No chest pain, dyspnea, fever, chills, leg swelling, or neuropathy.    MEDICAL HISTORY:  Past Medical History:  Diagnosis Date  . Diabetes mellitus type I, controlled ([place])   . Dyslipidemia   . ED (erectile dysfunction)   . Essential hypertension   . Gilbert's disease   . Hepatitis B carrier [facility])     SURGICAL HISTORY: Past Surgical History:  Procedure Laterality Date  . APPENDECTOMY  1964  . BILIARY BRUSHING  01/21/2019   Procedure: BILIARY BRUSHING;  Surgeon: Carol Ada, MD;  Location: [facility];  Service: Endoscopy;;  . BILIARY STENT PLACEMENT N/A 01/21/2019   Procedure: BILIARY STENT PLACEMENT;  Surgeon: Carol Ada, MD;  Location: [facility];  Service: Endoscopy;  Laterality: N/A;  . ENDOSCOPIC RETROGRADE CHOLANGIOPANCREATOGRAPHY (ERCP) WITH PROPOFOL N/A 01/21/2019   Procedure: ENDOSCOPIC RETROGRADE CHOLANGIOPANCREATOGRAPHY (ERCP) WITH PROPOFOL;  Surgeon: Carol Ada, MD;  Location: [facility];   Service: Endoscopy;  Laterality: N/A;  . ESOPHAGOGASTRODUODENOSCOPY (EGD) WITH PROPOFOL N/A 01/21/2019   Procedure: ESOPHAGOGASTRODUODENOSCOPY (EGD) WITH PROPOFOL;  Surgeon: Carol Ada, MD;  Location: [facility];  Service: Endoscopy;  Laterality: N/A;  . FEMORAL HERNIA REPAIR    . IR CHOLANGIOGRAM EXISTING TUBE  05/13/2019  . IR CHOLANGIOGRAM EXISTING TUBE  06/08/2019  . IR EXCHANGE BILIARY DRAIN  05/18/2019  . IR EXCHANGE BILIARY DRAIN  06/08/2019  . IR IMAGING GUIDED PORT INSERTION  03/08/2019  . IR PERC CHOLECYSTOSTOMY  05/04/2019  . LEFT HEART CATH AND CORONARY ANGIOGRAPHY N/A 05/19/2017   Procedure: LEFT HEART CATH AND CORONARY ANGIOGRAPHY;  Surgeon: Leonie Man, MD;  Location: [facility];  Service: Cardiovascular;  Laterality: N/A;  . SPHINCTEROTOMY  01/21/2019   Procedure: SPHINCTEROTOMY;  Surgeon: Carol Ada, MD;  Location: [facility];  Service: Endoscopy;;  . UPPER ESOPHAGEAL ENDOSCOPIC ULTRASOUND (EUS) N/A 01/21/2019   Procedure: UPPER ESOPHAGEAL ENDOSCOPIC ULTRASOUND (EUS);  Surgeon: Carol Ada, MD;  Location: [facility];  Service: Endoscopy;  Laterality: N/A;    I have reviewed the social history and family history with the patient and they are unchanged from previous note.  ALLERGIES:  is allergic to erythromycin.  MEDICATIONS:  Current Outpatient Medications  Medication Sig Dispense Refill  . acetaminophen (TYLENOL) 500 MG tablet Take 1,000 mg by mouth 2 (two) times daily as needed for moderate pain or headache.    Marland Kitchen amLODipine (NORVASC) 5 MG tablet Take 5 mg by mouth daily.     Marland Kitchen atenolol (TENORMIN) 25 MG tablet Take 25 mg by mouth daily. Once a day     . atorvastatin (LIPITOR) 10 MG tablet Take 10 mg by mouth daily.    . Blood Glucose Monitoring Suppl (ACCU-CHEK NANO SMARTVIEW) W/DEVICE KIT 1 kit by Does not apply route 2 (two) times daily. (Patient taking differently: 1 kit by Does not apply route See admin instructions. Test blood sugars 12x's daily) 1  kit 0  . glucose blood test strip Test 3 times a day. (Patient taking differently: 1 each by Other route See admin instructions. Test 12 times a day.) 300 each Prn  . insulin NPH Human (NOVOLIN N) 100 UNIT/ML injection Inject 20 Units into the skin at bedtime.     . insulin regular (NOVOLIN R) 100 units/mL injection Inject 20 Units into the skin 3 (three) times daily before meals.     . lidocaine-prilocaine (EMLA) cream Apply to affected area once 30 g 3  . lisinopril (ZESTRIL) 20 MG tablet Take 20 mg by mouth daily.     . magnesium oxide (MAG-OX) 400 MG tablet Take 1 tablet (400 mg total) by mouth daily. 30 tablet 2  . metFORMIN (GLUCOPHAGE) 1000 MG tablet Take 2,000 mg by mouth every evening.     . ondansetron (ZOFRAN) 8 MG tablet TAKE 1 TABLET BY MOUTH 2 TIMES DAY AS NEEDED. START ON 3RD DAY AFTER CHEMOTHERAPY 30 tablet 1  . oxycodone (OXY-IR) 5 MG capsule Take 1 capsule (5 mg total) by mouth every 6 (six) hours as needed for pain. 60 capsule 0  . prochlorperazine (COMPAZINE) 10 MG tablet TAKE 1 TABLET (10 MG TOTAL) BY MOUTH EVERY 6 (SIX) HOURS AS NEEDED (NAUSEA OR VOMITING). 30 tablet 1  .  zolpidem (AMBIEN) 5 MG tablet Take 1-2 tablets (5-10 mg total) by mouth at bedtime as needed for sleep. 60 tablet 0  . LORazepam (ATIVAN) 0.5 MG tablet Take 0.5-1 tablets (0.25-0.5 mg total) by mouth every 8 (eight) hours as needed (nausea and vomiting). 30 tablet 0   No current facility-administered medications for this visit.   Facility-Administered Medications Ordered in Other Visits  Medication Dose Route Frequency Provider Last Rate Last Admin  . heparin lock flush 100 unit/mL  500 Units Intracatheter Once PRN [name], MD      . sodium chloride flush (NS) 0.9 % injection 10 mL  10 mL Intracatheter PRN [name], MD        PHYSICAL EXAMINATION: ECOG PERFORMANCE STATUS: 1 - Symptomatic but completely ambulatory  Vitals:   10/07/19 0909  BP: 130/75  Pulse: 78  Resp: 18  Temp: 97.9 F (36.6 C)   SpO2: 98%   Filed Weights   10/07/19 0909  Weight: 148 lb 12.8 oz (67.5 kg)    GENERAL:alert, no distress and comfortable SKIN: no rash, pallor, or juandice  EYES:  sclera clear LUNGS: clear with normal breathing effort HEART: regular rate & rhythm, no lower extremity edema ABDOMEN:abdomen soft, non-tender and normal bowel sounds. No palpable mass  NEURO: alert & oriented x 3 with fluent speech, normal gait PAC without erythema   LABORATORY DATA:  I have reviewed the data as listed CBC Latest Ref Rng & Units 10/07/2019 09/23/2019 09/16/2019  WBC 4.0 - 10.5 K/uL 7.9 7.7 11.6(H)  Hemoglobin 13.0 - 17.0 g/dL 9.2(L) 9.5(L) 10.5(L)  Hematocrit 39.0 - 52.0 % 27.9(L) 27.8(L) 31.7(L)  Platelets 150 - 400 K/uL 271 194 299     CMP Latest Ref Rng & Units 10/07/2019 09/23/2019 09/16/2019  Glucose 70 - 99 mg/dL 181(H) 150(H) 158(H)  BUN 8 - 23 mg/dL 19 20 16   Creatinine 0.61 - 1.24 mg/dL 1.37(H) 1.13 1.36(H)  Sodium 135 - 145 mmol/L 133(L) 134(L) 135  Potassium 3.5 - 5.1 mmol/L 4.3 4.1 4.5  Chloride 98 - 111 mmol/L 100 100 99  CO2 22 - 32 mmol/L 25 24 26   Calcium 8.9 - 10.3 mg/dL 8.8(L) 8.6(L) 8.9  Total Protein 6.5 - 8.1 g/dL 6.8 6.6 6.9  Total Bilirubin 0.3 - 1.2 mg/dL 0.3 0.3 0.4  Alkaline Phos 38 - 126 U/L 70 57 77  AST 15 - 41 U/L 14(L) 16 16  ALT 0 - 44 U/L 16 16 11       RADIOGRAPHIC STUDIES: I have personally reviewed the radiological images as listed and agreed with the findings in the report. No results found.   ASSESSMENT & PLAN: Christopher Welch a 73 y.o.malewith   1. Extrahepatic cholangiocarcinoma, cTxN0M0 -He wasdiagnosedin 12/2018; presented withmalignant stricture and CBD obstruction required stenting. Brushing revealed adenocarcinoma. He underwent attempted whipple per Dr. Zenia Resides at [facility] which was aborted due to vascular invasion -He was referred to our clinic to initiate chemotherapy in the neoadjuvant setting. -He proceeded with neoadjuvantgemcitabine  and cisplatin on days 1 and 8 every 21 daysstarting 03/12/19. Abraxane added with cycle 2.AddedGCSF ([name])to day 9 startingcycle 2. -Hetolerated first few cycles very well without significant side effects;tumor marker CA 19.9dropped significantly as he started chemo, indicating good response to chemo treatment -his performance status decreased and abraxane was held with cycle 4  -remains able to tolerate gem/cis on days 1 and 8 q21 days well overall with stable disease. He experiences intermittent fatigue and nausea but able to recover well  -  continue f/u with Dr. Carlis Abbott at [facility], last visit on 12/28, next visit 1/25 after restaging  2. CINV, constipation -nausea worsened secondary to chemotherapy and recent constipation episode in 08/2019  -He has nausea without vomiting lately, no constipation. Nausea is intermittent but not completely controlled with zofran and compazine, will add low dose ativan PRN. If not improved may add short course decadron but will try to avoid due to DM  3. Acute cholecystitis -Hospitalizedon 05/03/19; s/ppercutaneous gallbladder drainage tube placed by IR8/5/20. -completed antibiotics -tube exchanged 8/18 due to leakagewhich has resolved, his abdominal pain improved until it began leaking again on 9/8. IR could not exchange the tube as the gallbladder was completely decompressed and it was removed -[place] on 9/18 showed no evidence of cholecystitis -no clinical evidence of recurrent cholecystitis today    4. Weight lossand low appetite -secondary to malignancyand chemo; he presented with 20 lbs weight loss  -Continue to f/u with dietician -His appetitehas dropped significantly when he had cholecystitis.  -continue supplements, he has gained some weight lately   5. DM -managed by PCP Dr. Theda Sers -he has had DM for 20 years, he is compliant with regimen and knows how to adjust BG -on insulin, he knows how to adjust dose based on his BG  level -uncontrolled BG likely contributing to decreased renal function -BG 181 today, improved lately   6. CAD, HL, HTN -followed by cardiologist Dr. Ellyn Hack -on amlodipine, atenolol, lisinopril, and statin -BP stable off lisinopril, Cr fluctuates  -monitoring   7. Social support -he is single, no children, he lives alone. He has a good friend whochecks on him -followed by SW  8.Insomnia -He is able to fall asleep but has trouble staying asleep -tried Z-quil and ambien but stopped due to lack of efficacy   Disposition:  Christopher Welch appears stable. He completed 9 cycles cisplatin and gemcitabine on days 1, 8 q21 days. He tolerates chemo moderately well with intermittent fatigue and nausea that is not completely controlled. Will add low dose ativan to compazine and zofran. We reviewed potential side effects especially sedation when used with percocet. He does not take Ambien due to lack of efficacy; I cautioned him to avoid with these other meds. I informed him not to drive after taking these. He understands.   CBC and CMP reviewed, adequate for treatment. Cr slightly increased but at his baseline. I encouraged him to double his liquid intake. CA 19-9 on 09/16/19 was slightly above normal but stable at 38, level is pending from today.   Overall adequate to proceed with cycle 10 day 1 cis/gem today. He will return next week for day 8. He is being referred for restaging CT CAP after this cycle. Per Dr. Ainsley Spinner request will use water for oral contrast. F/u with [facility] on 1/25. F/u with [place] in 3 weeks for next cycle.   No problem-specific Assessment & Plan notes found for this encounter.   Orders Placed This Encounter  Procedures  . CT Abdomen Pelvis W Contrast    Standing Status:   Future    Standing Expiration Date:   10/06/2020    Order Specific Question:   ** REASON FOR EXAM (FREE TEXT)    Answer:   cholangiocarcinoma, restage s/p 10 chemo cycles    Order Specific Question:   If  indicated for the ordered procedure, I authorize the administration of contrast media per Radiology protocol    Answer:   Yes    Order Specific Question:  Preferred imaging location?    Answer:   [facility]    Order Specific Question:   Is Oral Contrast requested for this exam?    Answer:   No oral contrast    Order Specific Question:   Reason for No Oral Contrast    Answer:   Other    Order Specific Question:   Please answer why no oral contrast is requested    Answer:   per Dr. Carlis Abbott [facility] surgery, please use water for oral contrast    Order Specific Question:   Radiology Contrast Protocol - do NOT remove file path    Answer:   \\[email or web address]  . CT Chest W Contrast    Standing Status:   Future    Standing Expiration Date:   10/06/2020    Order Specific Question:   ** REASON FOR EXAM (FREE TEXT)    Answer:   cholangiocarcinoma, restage s/p 10 cycles of chemo    Order Specific Question:   If indicated for the ordered procedure, I authorize the administration of contrast media per Radiology protocol    Answer:   Yes    Order Specific Question:   Preferred imaging location?    Answer:   [facility]    Order Specific Question:   Radiology Contrast Protocol - do NOT remove file path    Answer:   \\[email or web address]   All questions were answered. The patient knows to call the clinic with any problems, questions or concerns. No barriers to learning was detected.     Alla Feeling, NP 10/07/19

## 2019-10-07 NOTE — Patient Instructions (Signed)
[facility] Discharge Instructions for Patients Receiving Chemotherapy  Today you received the following chemotherapy agents Gemcitabine (GEMZAR) & Cisplatin (PLATINOL).  To help prevent nausea and vomiting after your treatment, we encourage you to take your nausea medication as prescribed.   If you develop nausea and vomiting that is not controlled by your nausea medication, call the clinic.   BELOW ARE SYMPTOMS THAT SHOULD BE REPORTED IMMEDIATELY:  *FEVER GREATER THAN 100.5 F  *CHILLS WITH OR WITHOUT FEVER  NAUSEA AND VOMITING THAT IS NOT CONTROLLED WITH YOUR NAUSEA MEDICATION  *UNUSUAL SHORTNESS OF BREATH  *UNUSUAL BRUISING OR BLEEDING  TENDERNESS IN MOUTH AND THROAT WITH OR WITHOUT PRESENCE OF ULCERS  *URINARY PROBLEMS  *BOWEL PROBLEMS  UNUSUAL RASH Items with * indicate a potential emergency and should be followed up as soon as possible.  Feel free to call the clinic should you have any questions or concerns. The clinic phone number is [phone].  Please show the CHEMO ALERT CARD at check-in to the Emergency Department and triage nurse.  Coronavirus (COVID-19) Are you at risk?  Are you at risk for the Coronavirus (COVID-19)?  To be considered HIGH RISK for Coronavirus (COVID-19), you have to meet the following criteria:  . Traveled to [place], [place], [place], [place] or [place]; or in the [place] to [place], [place], [place], or [place]; and have fever, cough, and shortness of breath within the last 2 weeks of travel OR . Been in close contact with a person diagnosed with COVID-19 within the last 2 weeks and have fever, cough, and shortness of breath . IF YOU DO NOT MEET THESE CRITERIA, YOU ARE CONSIDERED LOW RISK FOR COVID-19.  What to do if you are HIGH RISK for COVID-19?  . If you are having a medical emergency, call 911. . Seek medical care right away. Before you go to a doctor's office, urgent care or emergency department, call  ahead and tell them about your recent travel, contact with someone diagnosed with COVID-19, and your symptoms. You should receive instructions from your physician's office regarding next steps of care.  . When you arrive at healthcare provider, tell the healthcare staff immediately you have returned from visiting [place], [place], [place], [place] or [place]; or traveled in the [place] to [place], [place], [place], or [place]; in the last two weeks or you have been in close contact with a person diagnosed with COVID-19 in the last 2 weeks.   . Tell the health care staff about your symptoms: fever, cough and shortness of breath. . After you have been seen by a medical provider, you will be either: o Tested for (COVID-19) and discharged home on quarantine except to seek medical care if symptoms worsen, and asked to  - Stay home and avoid contact with others until you get your results (4-5 days)  - Avoid travel on public transportation if possible (such as bus, train, or airplane) or o Sent to the Emergency Department by EMS for evaluation, COVID-19 testing, and possible admission depending on your condition and test results.  What to do if you are LOW RISK for COVID-19?  Reduce your risk of any infection by using the same precautions used for avoiding the common cold or flu:  . Wash your hands often with soap and warm water for at least 20 seconds.  If soap and water are not readily available, use an alcohol-based hand sanitizer with at least 60% alcohol.  .   If coughing or sneezing, cover your mouth and nose by coughing or sneezing into the elbow areas of your shirt or coat, into a tissue or into your sleeve (not your hands). . Avoid shaking hands with others and consider head nods or verbal greetings only. . Avoid touching your eyes, nose, or mouth with unwashed hands.  . Avoid close contact with people who are sick. . Avoid places or events with large numbers of people in one location,  like concerts or sporting events. . Carefully consider travel plans you have or are making. . If you are planning any travel outside or inside the [place], visit the CDC's Travelers' Health webpage for the latest health notices. . If you have some symptoms but not all symptoms, continue to monitor at home and seek medical attention if your symptoms worsen. . If you are having a medical emergency, call 911.   ADDITIONAL HEALTHCARE OPTIONS FOR PATIENTS  [facility] / e-Visit: [email or web address]         [facility]: [phone]  [facility]: [phone]                   [facility]: [phone]    

## 2019-10-08 ENCOUNTER — Telehealth: Payer: Self-pay

## 2019-10-08 ENCOUNTER — Telehealth: Payer: Self-pay | Admitting: Nurse Practitioner

## 2019-10-08 LAB — CANCER ANTIGEN 19-9: CA 19-9: 51 U/mL — ABNORMAL HIGH (ref 0–35)

## 2019-10-08 NOTE — Telephone Encounter (Signed)
Scheduled appt per 1/7 los 

## 2019-10-08 NOTE — Telephone Encounter (Signed)
Mr Lardie called to request port access for ct scan on 10/20/2019.  LOS sent to scheduling.

## 2019-10-09 ENCOUNTER — Encounter: Payer: Self-pay | Admitting: Hematology

## 2019-10-13 ENCOUNTER — Encounter: Payer: Self-pay | Admitting: Hematology

## 2019-10-14 ENCOUNTER — Inpatient Hospital Stay: Payer: Medicare Other

## 2019-10-14 ENCOUNTER — Other Ambulatory Visit: Payer: Self-pay

## 2019-10-14 VITALS — BP 150/77 | HR 81 | Temp 97.2°F | Resp 18 | Ht 67.0 in | Wt 149.2 lb

## 2019-10-14 DIAGNOSIS — B181 Chronic viral hepatitis B without delta-agent: Secondary | ICD-10-CM | POA: Diagnosis not present

## 2019-10-14 DIAGNOSIS — I2510 Atherosclerotic heart disease of native coronary artery without angina pectoris: Secondary | ICD-10-CM | POA: Diagnosis not present

## 2019-10-14 DIAGNOSIS — C221 Intrahepatic bile duct carcinoma: Secondary | ICD-10-CM

## 2019-10-14 DIAGNOSIS — R05 Cough: Secondary | ICD-10-CM | POA: Diagnosis not present

## 2019-10-14 DIAGNOSIS — I10 Essential (primary) hypertension: Secondary | ICD-10-CM | POA: Diagnosis not present

## 2019-10-14 DIAGNOSIS — E860 Dehydration: Secondary | ICD-10-CM | POA: Diagnosis not present

## 2019-10-14 DIAGNOSIS — Z95828 Presence of other vascular implants and grafts: Secondary | ICD-10-CM

## 2019-10-14 LAB — CMP (CANCER CENTER ONLY)
ALT: 19 U/L (ref 0–44)
AST: 21 U/L (ref 15–41)
Albumin: 3.4 g/dL — ABNORMAL LOW (ref 3.5–5.0)
Alkaline Phosphatase: 70 U/L (ref 38–126)
Anion gap: 13 (ref 5–15)
BUN: 22 mg/dL (ref 8–23)
CO2: 23 mmol/L (ref 22–32)
Calcium: 8.8 mg/dL — ABNORMAL LOW (ref 8.9–10.3)
Chloride: 99 mmol/L (ref 98–111)
Creatinine: 1.47 mg/dL — ABNORMAL HIGH (ref 0.61–1.24)
GFR, Est AFR Am: 54 mL/min — ABNORMAL LOW (ref >60)
GFR, Estimated: 47 mL/min — ABNORMAL LOW (ref >60)
Glucose, Bld: 188 mg/dL — ABNORMAL HIGH (ref 70–99)
Potassium: 4 mmol/L (ref 3.5–5.1)
Sodium: 135 mmol/L (ref 135–145)
Total Bilirubin: 0.3 mg/dL (ref 0.3–1.2)
Total Protein: 6.9 g/dL (ref 6.5–8.1)

## 2019-10-14 LAB — MAGNESIUM: Magnesium: 1.7 mg/dL (ref 1.7–2.4)

## 2019-10-14 LAB — CBC WITH DIFFERENTIAL (CANCER CENTER ONLY)
Abs Immature Granulocytes: 0.07 10*3/uL (ref 0.00–0.07)
Basophils Absolute: 0 10*3/uL (ref 0.0–0.1)
Basophils Relative: 1 %
Eosinophils Absolute: 0 10*3/uL (ref 0.0–0.5)
Eosinophils Relative: 0 %
HCT: 26.2 % — ABNORMAL LOW (ref 39.0–52.0)
Hemoglobin: 8.9 g/dL — ABNORMAL LOW (ref 13.0–17.0)
Immature Granulocytes: 1 %
Lymphocytes Relative: 24 %
Lymphs Abs: 1.5 10*3/uL (ref 0.7–4.0)
MCH: 31.6 pg (ref 26.0–34.0)
MCHC: 34 g/dL (ref 30.0–36.0)
MCV: 92.9 fL (ref 80.0–100.0)
Monocytes Absolute: 0.7 10*3/uL (ref 0.1–1.0)
Monocytes Relative: 11 %
Neutro Abs: 3.9 10*3/uL (ref 1.7–7.7)
Neutrophils Relative %: 63 %
Platelet Count: 263 10*3/uL (ref 150–400)
RBC: 2.82 MIL/uL — ABNORMAL LOW (ref 4.22–5.81)
RDW: 15.8 % — ABNORMAL HIGH (ref 11.5–15.5)
WBC Count: 6.2 10*3/uL (ref 4.0–10.5)
nRBC: 0 % (ref 0.0–0.2)

## 2019-10-14 MED ORDER — SODIUM CHLORIDE 0.9 % IV SOLN
Freq: Once | INTRAVENOUS | Status: AC
  Filled 2019-10-14: qty 1000

## 2019-10-14 MED ORDER — SODIUM CHLORIDE 0.9 % IV SOLN
25.0000 mg/m2 | Freq: Once | INTRAVENOUS | Status: AC
  Administered 2019-10-14: 45 mg via INTRAVENOUS
  Filled 2019-10-14: qty 45

## 2019-10-14 MED ORDER — PALONOSETRON HCL INJECTION 0.25 MG/5ML
0.2500 mg | Freq: Once | INTRAVENOUS | Status: AC
  Administered 2019-10-14: 0.25 mg via INTRAVENOUS

## 2019-10-14 MED ORDER — SODIUM CHLORIDE 0.9% FLUSH
10.0000 mL | INTRAVENOUS | Status: DC | PRN
  Administered 2019-10-14: 10 mL
  Filled 2019-10-14: qty 10

## 2019-10-14 MED ORDER — PALONOSETRON HCL INJECTION 0.25 MG/5ML
INTRAVENOUS | Status: AC
  Filled 2019-10-14: qty 5

## 2019-10-14 MED ORDER — SODIUM CHLORIDE 0.9 % IV SOLN
Freq: Once | INTRAVENOUS | Status: AC
  Filled 2019-10-14: qty 5

## 2019-10-14 MED ORDER — HEPARIN SOD (PORK) LOCK FLUSH 100 UNIT/ML IV SOLN
500.0000 [IU] | Freq: Once | INTRAVENOUS | Status: AC | PRN
  Administered 2019-10-14: 500 [IU]
  Filled 2019-10-14: qty 5

## 2019-10-14 MED ORDER — SODIUM CHLORIDE 0.9 % IV SOLN
Freq: Once | INTRAVENOUS | Status: AC
  Filled 2019-10-14: qty 250

## 2019-10-14 MED ORDER — SODIUM CHLORIDE 0.9 % IV SOLN
800.0000 mg/m2 | Freq: Once | INTRAVENOUS | Status: AC
  Administered 2019-10-14: 1444 mg via INTRAVENOUS
  Filled 2019-10-14: qty 37.98

## 2019-10-14 NOTE — Patient Instructions (Signed)
[facility] Discharge Instructions for Patients Receiving Chemotherapy  Today you received the following chemotherapy agents:  Gemcitibine (Gemzar), Cisplatin  To help prevent nausea and vomiting after your treatment, we encourage you to take your nausea medication as prescribed.   If you develop nausea and vomiting that is not controlled by your nausea medication, call the clinic.   BELOW ARE SYMPTOMS THAT SHOULD BE REPORTED IMMEDIATELY:  *FEVER GREATER THAN 100.5 F  *CHILLS WITH OR WITHOUT FEVER  NAUSEA AND VOMITING THAT IS NOT CONTROLLED WITH YOUR NAUSEA MEDICATION  *UNUSUAL SHORTNESS OF BREATH  *UNUSUAL BRUISING OR BLEEDING  TENDERNESS IN MOUTH AND THROAT WITH OR WITHOUT PRESENCE OF ULCERS  *URINARY PROBLEMS  *BOWEL PROBLEMS  UNUSUAL RASH Items with * indicate a potential emergency and should be followed up as soon as possible.  Feel free to call the clinic should you have any questions or concerns. The clinic phone number is [phone].  Please show the Sar[facility] at check-in to the Emergency Department and triage nurse.

## 2019-10-15 ENCOUNTER — Encounter: Payer: Self-pay | Admitting: Hematology

## 2019-10-16 ENCOUNTER — Inpatient Hospital Stay: Payer: Medicare Other

## 2019-10-16 VITALS — BP 135/75 | HR 72 | Temp 97.5°F | Resp 18

## 2019-10-16 DIAGNOSIS — B181 Chronic viral hepatitis B without delta-agent: Secondary | ICD-10-CM | POA: Diagnosis not present

## 2019-10-16 DIAGNOSIS — R05 Cough: Secondary | ICD-10-CM | POA: Diagnosis not present

## 2019-10-16 DIAGNOSIS — I2510 Atherosclerotic heart disease of native coronary artery without angina pectoris: Secondary | ICD-10-CM | POA: Diagnosis not present

## 2019-10-16 DIAGNOSIS — I10 Essential (primary) hypertension: Secondary | ICD-10-CM | POA: Diagnosis not present

## 2019-10-16 DIAGNOSIS — E860 Dehydration: Secondary | ICD-10-CM | POA: Diagnosis not present

## 2019-10-16 DIAGNOSIS — C221 Intrahepatic bile duct carcinoma: Secondary | ICD-10-CM | POA: Diagnosis not present

## 2019-10-16 MED ORDER — PEGFILGRASTIM-CBQV 6 MG/0.6ML ~~LOC~~ SOSY
6.0000 mg | PREFILLED_SYRINGE | Freq: Once | SUBCUTANEOUS | Status: AC
  Administered 2019-10-16: 6 mg via SUBCUTANEOUS

## 2019-10-16 NOTE — Patient Instructions (Signed)

## 2019-10-18 NOTE — Progress Notes (Addendum)
[name]   [phone] [phone]   Clinic Follow up Note   Patient Care Team: [name], DO as PCP - General (Family Medicine)   I connected with Christopher Welch on 10/22/2019 at  1:40 PM EST by telephone visit and verified that I am speaking with the correct person using two identifiers.  I discussed the limitations, risks, security and privacy concerns of performing an evaluation and management service by telephone and the availability of in person appointments. I also discussed with the patient that there may be a patient responsible charge related to this service. The patient expressed understanding and agreed to proceed.    Patient's location:  His home  Provider's location:  My Office   CHIEF COMPLAINT: F/u of ExtrahepaticCholangiocarcinoma  SUMMARY OF ONCOLOGIC HISTORY: Oncology History  Cholangiocarcinoma ([place])  01/14/2019 Imaging   US Abdomen 01/14/19  IMPRESSION: There is significant intrahepatic and extrahepatic biliary dilatation present, concerning for distal common bile duct obstruction. Further evaluation with CT scan or MRCP is recommended. Correlation with liver function tests is recommended as well. These results will be called to the ordering clinician or representative by the Radiologist Assistant, and communication documented in the PACS or zVision Dashboard.   Probable large amount of sludge seen within gallbladder lumen with mild gallbladder wall thickening. However, no cholelithiasis, pericholecystic fluid or sonographic Murphy's sign is noted.   4.2 cm septated cyst seen in upper pole of right kidney consistent with Bosniak type 2 lesion. Follow-up ultrasound in 1 year is recommended to ensure stability.   01/20/2019 Imaging   MRI abdomen 01/20/19  IMPRESSION: 1. Findings are highly concerning for central tumor in the biliary tract at the confluence of the common hepatic duct, cystic duct and common bile  ducts. Further clinical evaluation for potential cholangiocarcinoma is strongly recommended. 2. Mild ductal dilatation of the main pancreatic duct throughout the distal body and tail of the pancreas where there is also some associated side branch ectasia. This may suggest a pancreatic ductal stricture. No obstructing pancreatic neoplasm identified. 3. Aortic atherosclerosis.   01/21/2019 Initial Biopsy   Diagnosis 01/21/19  BILE DUCT BRUSHING (SPECIMEN 1 OF 1 COLLECTED 01/21/2019) ADENOCARCINOMA.   01/21/2019 Procedure   ERCP By Dr hung 01/21/19 IMRPESSION - The major papilla appeared normal. - A single localized biliary stricture was found in the middle third of the main bile duct. - The entire main bile duct and upper third of the main bile duct were dilated, secondary to a stricture. - A biliary sphincterotomy was performed. - Cells for cytology obtained in the middle third of the main bile duct. - One plastic stent was placed into the common bile duct.  EUS by Dr hung 01/21/19  IMPRESSION - There was dilation in the middle third of the main bile duct and in the upper third of the main bile duct which measured up to 15 mm. - There was a suggestion of a stricture in the middle third of the main bile duct. - No specimens collected.   02/03/2019 Imaging   CT CAP at [facility] 02/03/19  Impression:  1. There is mild intrahepatic and extrahepatic biliary ductal dilation and diffuse common bile duct wall thickening with stent in place. There is abnormal soft tissue measuring approximately 1.6 cm between the common hepatic artery, portal vein and common bile duct, worrisome for the known cholangiocarcinoma.  2. Periportal lymphadenopathy which may represent nodal metastasis.  4. Marked pancreatic atrophy and duct dilation involving the  distal body and tail the pancreas where there is a coarse calcification. These findings are favored to represent stenosis from intraductal stones though  an underlying stricture cannot be completely excluded.  5. Atypical symmetric prominent fat stranding of the bilateral lower abdominal wall. Correlate with surgical history or history of history of trauma.   03/08/2019 Initial Diagnosis   Cholangiocarcinoma ([place])   03/12/2019 -  Chemotherapy   gemcitabine and cisplatin on days 1 and 8 every 21 days starting 03/12/19. Abraxane added with cycle 2. Added GCSF [name]) to day 9 starting cycle 2.    03/19/2019 Cancer Staging   Staging form: Perihilar Bile Ducts, AJCC 8th Edition - Clinical: Stage Unknown (cTX, cN0, cM0) - Signed by [name], MD on 03/19/2019   05/24/2019 Imaging   CT CAP W Contrast at [facility]  1.  Interval placement of covered metallic biliary stent. No progressive dilation of the biliary tree and resulting resolution of the prior main pancreatic duct dilation. 2.  Evidence of some subtle vascular contour deformity involving the hepatic artery, suspected to be from tumoral contact and/or posttreatment changes. 3.  New small right pleural effusion with overlying mild airspace disease. Secondary findings which may indicate a component of aspiration. 4.  No convincing evidence of new disseminated metastatic disease. 5.  Additional findings as discussed above.   06/18/2019 Imaging   CT AP IMPRESSION: 1.  No acute findings in the abdomen/pelvis. 2. Biliary stent in adequate position. No evidence of focal mass or adenopathy in the region of the pancreatic head or porta hepatis. 3. Moderate size right pleural effusion with associated right basilar atelectasis. 4. Possible single gallstone. Stable subcentimeter liver cyst. Stable right renal cysts. Small left inguinal hernia containing only peritoneal fat. 5. Aortic Atherosclerosis (ICD10-I70.0).   07/28/2019 Imaging   CT CAP IMPRESSION: 1. The patient is status post common bile duct stent placement, with unchanged stent position and patent appearance with pneumobilia. Primary  cholangiocarcinoma is not discretely appreciated by CT. 2. No direct evidence of metastatic disease in the chest, abdomen, or pelvis. 3. Moderate right pleural effusion with associated atelectasis or consolidation, slightly improved compared to prior examination. 4. The pancreatic parenchyma is atrophic and calcified, particularly in the pancreatic tail. 5.  Coronary artery disease.  Aortic atherosclerosis   10/20/2019 PET scan   IMPRESSION: 1. Redemonstrated post treatment and post stenting appearance of a central cholangiocarcinoma, which is not directly appreciated by CT. Common bile duct stent remains in position with post stenting pneumobilia. 2. Slight interval increase in a moderate to large right pleural effusion with associated atelectasis or consolidation. There may be some pleural nodularity about the right hemidiaphragm which appears new compared to prior examination (series 2, image 40), highly suspicious for pleural metastatic disease. 3. No direct evidence of metastatic disease in the chest, abdomen, or pelvis. 4. Coronary artery disease.  Aortic Atherosclerosis (ICD10-I70.0).        CURRENT THERAPY:  Gemcitabine and cisplatin on days 1 and 8 every 21 daysstarting 03/12/19. Abraxane added with cycle 2.AddedGCSF ([name])to day 9 startingcycle 2.Chemo held since 06/03/19 due to poor toleration. Chemo restarted with Gem/Cis with C6 on 07/02/19.  INTERVAL HISTORY:  Christopher Welch is here for a follow up.  He notes he has been feeling weak. He is still able to do what he needs. He notes neuropathy with numbness only of b/l fingers an toes now. He notes still having adequate dexterity. He feels he is eating and drinking adequately. He notes his  SOB has progressed when he is walking a distance. He note she still has abdominal pain. He rarely uses oxycodone but needs refill. He takes it a few days a week.    REVIEW OF SYSTEMS:   Constitutional: Denies fevers, chills or  abnormal weight loss (+) Fatigue  Eyes: Denies blurriness of vision Ears, nose, mouth, throat, and face: Denies mucositis or sore throat Respiratory: Denies dyspnea or wheezes (+) increased SOB  Cardiovascular: Denies palpitation, chest discomfort or lower extremity swelling Gastrointestinal:  Denies nausea, heartburn or change in bowel habits (+) Abdominal pain  Skin: Denies abnormal skin rashes Lymphatics: Denies new lymphadenopathy or easy bruising Neurological: (+) Numbness of b/l fingers and toes  Behavioral/Psych: Mood is stable, no new changes  All other systems were reviewed with the patient and are negative.  MEDICAL HISTORY:  Past Medical History:  Diagnosis Date  . Diabetes mellitus type I, controlled ([place])   . Dyslipidemia   . ED (erectile dysfunction)   . Essential hypertension   . Gilbert's disease   . Hepatitis B carrier [facility])     SURGICAL HISTORY: Past Surgical History:  Procedure Laterality Date  . APPENDECTOMY  1964  . BILIARY BRUSHING  01/21/2019   Procedure: BILIARY BRUSHING;  Surgeon: Carol Ada, MD;  Location: [facility];  Service: Endoscopy;;  . BILIARY STENT PLACEMENT N/A 01/21/2019   Procedure: BILIARY STENT PLACEMENT;  Surgeon: Carol Ada, MD;  Location: [facility]OS[facility];  Service: Endoscopy;  Laterality: N/A;  . ENDOSCOPIC RETROGRADE CHOLANGIOPANCREATOGRAPHY (ERCP) WITH PROPOFOL N/A 01/21/2019   Procedure: ENDOSCOPIC RETROGRADE CHOLANGIOPANCREATOGRAPHY (ERCP) WITH PROPOFOL;  Surgeon: Carol Ada, MD;  Location: [facility];  Service: Endoscopy;  Laterality: N/A;  . ESOPHAGOGASTRODUODENOSCOPY (EGD) WITH PROPOFOL N/A 01/21/2019   Procedure: ESOPHAGOGASTRODUODENOSCOPY (EGD) WITH PROPOFOL;  Surgeon: Carol Ada, MD;  Location: [facility];  Service: Endoscopy;  Laterality: N/A;  . FEMORAL HERNIA REPAIR    . IR CHOLANGIOGRAM EXISTING TUBE  05/13/2019  . IR CHOLANGIOGRAM EXISTING TUBE  06/08/2019  . IR EXCHANGE BILIARY DRAIN  05/18/2019  . IR EXCHANGE  BILIARY DRAIN  06/08/2019  . IR IMAGING GUIDED PORT INSERTION  03/08/2019  . IR PERC CHOLECYSTOSTOMY  05/04/2019  . LEFT HEART CATH AND CORONARY ANGIOGRAPHY N/A 05/19/2017   Procedure: LEFT HEART CATH AND CORONARY ANGIOGRAPHY;  Surgeon: Leonie Man, MD;  Location: [facility];  Service: Cardiovascular;  Laterality: N/A;  . SPHINCTEROTOMY  01/21/2019   Procedure: SPHINCTEROTOMY;  Surgeon: Carol Ada, MD;  Location: [facility];  Service: Endoscopy;;  . UPPER ESOPHAGEAL ENDOSCOPIC ULTRASOUND (EUS) N/A 01/21/2019   Procedure: UPPER ESOPHAGEAL ENDOSCOPIC ULTRASOUND (EUS);  Surgeon: Carol Ada, MD;  Location: [facility];  Service: Endoscopy;  Laterality: N/A;    I have reviewed the social history and family history with the patient and they are unchanged from previous note.  ALLERGIES:  is allergic to erythromycin.  MEDICATIONS:  Current Outpatient Medications  Medication Sig Dispense Refill  . acetaminophen (TYLENOL) 500 MG tablet Take 1,000 mg by mouth 2 (two) times daily as needed for moderate pain or headache.    Marland Kitchen amLODipine (NORVASC) 5 MG tablet Take 5 mg by mouth daily.     Marland Kitchen atenolol (TENORMIN) 25 MG tablet Take 25 mg by mouth daily. Once a day     . atorvastatin (LIPITOR) 10 MG tablet Take 10 mg by mouth daily.    . Blood Glucose Monitoring Suppl (ACCU-CHEK NANO SMARTVIEW) W/DEVICE KIT 1 kit by Does not apply route 2 (two) times daily. (  Patient taking differently: 1 kit by Does not apply route See admin instructions. Test blood sugars 12x's daily) 1 kit 0  . glucose blood test strip Test 3 times a day. (Patient taking differently: 1 each by Other route See admin instructions. Test 12 times a day.) 300 each Prn  . insulin NPH Human (NOVOLIN N) 100 UNIT/ML injection Inject 20 Units into the skin at bedtime.     . insulin regular (NOVOLIN R) 100 units/mL injection Inject 20 Units into the skin 3 (three) times daily before meals.     . lidocaine-prilocaine (EMLA) cream Apply to  affected area once 30 g 3  . lisinopril (ZESTRIL) 20 MG tablet Take 20 mg by mouth daily.     Marland Kitchen LORazepam (ATIVAN) 0.5 MG tablet Take 0.5-1 tablets (0.25-0.5 mg total) by mouth every 8 (eight) hours as needed (nausea and vomiting). 30 tablet 0  . magnesium oxide (MAG-OX) 400 MG tablet Take 1 tablet (400 mg total) by mouth daily. 30 tablet 2  . metFORMIN (GLUCOPHAGE) 1000 MG tablet Take 2,000 mg by mouth every evening.     . ondansetron (ZOFRAN) 8 MG tablet TAKE 1 TABLET BY MOUTH 2 TIMES DAY AS NEEDED. START ON 3RD DAY AFTER CHEMOTHERAPY 30 tablet 1  . oxycodone (OXY-IR) 5 MG capsule Take 1 capsule (5 mg total) by mouth every 6 (six) hours as needed for pain (severe pain). 20 capsule 0  . prochlorperazine (COMPAZINE) 10 MG tablet TAKE 1 TABLET (10 MG TOTAL) BY MOUTH EVERY 6 (SIX) HOURS AS NEEDED (NAUSEA OR VOMITING). 30 tablet 1  . zolpidem (AMBIEN) 5 MG tablet Take 1-2 tablets (5-10 mg total) by mouth at bedtime as needed for sleep. 60 tablet 0   No current facility-administered medications for this visit.    PHYSICAL EXAMINATION: ECOG PERFORMANCE STATUS: 2 - Symptomatic, <50% confined to bed  No vitals taken today, Exam not performed today   LABORATORY DATA:  I have reviewed the data as listed CBC Latest Ref Rng & Units 10/20/2019 10/14/2019 10/07/2019  WBC 4.0 - 10.5 K/uL 10.0 6.2 7.9  Hemoglobin 13.0 - 17.0 g/dL 8.4(L) 8.9(L) 9.2(L)  Hematocrit 39.0 - 52.0 % 24.7(L) 26.2(L) 27.9(L)  Platelets 150 - 400 K/uL 88(L) 263 271     CMP Latest Ref Rng & Units 10/20/2019 10/14/2019 10/07/2019  Glucose 70 - 99 mg/dL 170(H) 188(H) 181(H)  BUN 8 - 23 mg/dL 24(H) 22 19  Creatinine 0.61 - 1.24 mg/dL 1.40(H) 1.47(H) 1.37(H)  Sodium 135 - 145 mmol/L 133(L) 135 133(L)  Potassium 3.5 - 5.1 mmol/L 4.4 4.0 4.3  Chloride 98 - 111 mmol/L 98 99 100  CO2 22 - 32 mmol/L _0 Calcium 8.9 - 10.3 mg/dL 9.0 8.8(L) 8.8(L)  Total Protein 6.5 - 8.1 g/dL 7.0 6.9 6.8  Total Bilirubin 0.3 - 1.2 mg/dL 0.4 0.3 0.3   Alkaline Phos 38 - 126 U/L 100 70 70  AST 15 - 41 U/L 17 21 14(L)  ALT 0 - 44 U/L _1 RADIOGRAPHIC STUDIES: I have personally reviewed the radiological images as listed and agreed with the findings in the report. No results found.   ASSESSMENT & PLAN:  Christopher Welch is a 74 y.o. male with    1. Extrahepatic cholangiocarcinoma, cTxN0M0 -He wasdiagnosedin 12/2018. His work up revealed malignant stricture and CBD obstruction required stenting. Brushing revealed adenocarcinoma. His extrahepatic cholangiocarcinoma was not resectable due to vascular invasion(surgeon - Dr. Zenia Resides at [facility]) -  Hehas started first line chemogemcitabine and cisplatin on days 1 and 8 every 21 dayson6/12/20. Abraxane added with cycle 2.AddedGCSF ([name])to day 9 startingcycle 2.Abraxane stopped after C4 and chemo held 06/03/19-07/01/09 due to poor toleration/hospitalization. He has improved with dose reduction. -I personally reviewed and discussed his CT CAP from 10/20/19 with pt which shows moderate right pleural effusion which has increased from before, and nodularity along the pleura, suspicious for pleural metastatic cyst. No disease progression in liver.  -He has progressed SOB lately. I discussed with worsened pleural effusion, there is concern this is malignant. I recommend thoracentesis with cytology. If cytology negative will recommend PET scan for further evaluation. He is agreeable.    2.History of acute cholecystitis -He washospitalizedon 05/03/19.  -He had a percutaneous gallbladder drainage tube placed by IR8/5/20.It was removed on 9/8 due to leakage  -Hiscurrent abdominal pain is probably related to the procedure, I encouraged him to use pain medication as needed -No clinical high concern for acute cholecystitis we will continue to monitoring clinically  3.Right-sided pleural effusion -He underwent right thoracentesis with 2.1 is removedon 06/23/2019, cytologynegative.The  etiology is unclear, certainly malignancy is ahigh possibility. He was treated with course of antibiotics -His SOB has progressed lately and he has worsened right pleural effusion on 10/20/19 CT scan.  -Will repeat thoracentesis and Cytology next week   4. Weight lossand low appetite -secondary to malignancyand chemo, he presented with 20 lbs weight loss  -Continue to f/u with dietician -His appetitehas dropped significantly with recent cholecystitisand secondary to chemo due to nausea.  -I encouragedhim tocontinuenutritional supplement (carnation breakfast and Boost)to 3-5 bottles aday. -With chemo dose reduction (no Abraxane) he has been able to maintain weight.   5. DM -not well controlled -on insulin, managed by PCP Dr. Theda Sers -Due to elevated BG secondary to chemo pre-meds, he has been needing to increase his insulin but working on controlling it.  6. CAD, HL, HTN -on amlodipine, atenolol, lisinopril, and statin. Continue to follow up with cardiologist Dr. Ellyn Hack -controlled and stable   PLAN: -I refilled oxycodone today  -CT CAP reviewed  -thoracentesis with cytology next week.  I will call with results. If cytology negative, will get a PET scan for further evaluation  -lab, flush, Gem/cis in 2 weeks     No problem-specific Assessment & Plan notes found for this encounter.   Orders Placed This Encounter  Procedures  . IR THORACENTESIS ASP PLEURAL SPACE W/IMG GUIDE    Standing Status:   Future    Standing Expiration Date:   12/19/2020    Order Specific Question:   Are labs required for specimen collection?    Answer:   Yes    Order Specific Question:   Lab orders requested (DO NOT place separate lab orders, these will be automatically ordered during procedure specimen collection):    Answer:   Cytology - Non Pap    Order Specific Question:   Reason for Exam (SYMPTOM  OR DIAGNOSIS REQUIRED)    Answer:   symptom relieve and rule out malignant  effusion    Order Specific Question:   Preferred Imaging Location?    Answer:   [facility]    Order Specific Question:   Release to patient    Answer:   Immediate   I discussed the assessment and treatment plan with the patient. The patient was provided an opportunity to ask questions and all were answered. The patient agreed with the plan and demonstrated an understanding of the  instructions.  The patient was advised to call back or seek an in-person evaluation if the symptoms worsen or if the condition fails to improve as anticipated.   The total time spent in the appointment was 30 minutes.    [name], MD 10/22/2019   I, Joslyn Devon, am acting as scribe for [name], MD.   I have reviewed the above documentation for accuracy and completeness, and I agree with the above.

## 2019-10-20 ENCOUNTER — Ambulatory Visit (HOSPITAL_COMMUNITY)
Admission: RE | Admit: 2019-10-20 | Discharge: 2019-10-20 | Disposition: A | Discharge status: Home / Self Care | Payer: Medicare Other | Source: Ambulatory Visit | Attending: Nurse Practitioner | Admitting: Nurse Practitioner

## 2019-10-20 ENCOUNTER — Other Ambulatory Visit: Payer: Self-pay

## 2019-10-20 ENCOUNTER — Inpatient Hospital Stay: Payer: Medicare Other

## 2019-10-20 ENCOUNTER — Encounter (HOSPITAL_COMMUNITY): Payer: Self-pay

## 2019-10-20 VITALS — BP 147/78 | HR 92 | Resp 17

## 2019-10-20 DIAGNOSIS — E860 Dehydration: Secondary | ICD-10-CM

## 2019-10-20 DIAGNOSIS — R05 Cough: Secondary | ICD-10-CM | POA: Diagnosis not present

## 2019-10-20 DIAGNOSIS — C221 Intrahepatic bile duct carcinoma: Secondary | ICD-10-CM

## 2019-10-20 DIAGNOSIS — I2510 Atherosclerotic heart disease of native coronary artery without angina pectoris: Secondary | ICD-10-CM | POA: Diagnosis not present

## 2019-10-20 DIAGNOSIS — Z95828 Presence of other vascular implants and grafts: Secondary | ICD-10-CM

## 2019-10-20 DIAGNOSIS — I10 Essential (primary) hypertension: Secondary | ICD-10-CM | POA: Diagnosis not present

## 2019-10-20 DIAGNOSIS — B181 Chronic viral hepatitis B without delta-agent: Secondary | ICD-10-CM | POA: Diagnosis not present

## 2019-10-20 LAB — CBC WITH DIFFERENTIAL (CANCER CENTER ONLY)
Abs Immature Granulocytes: 0.1 10*3/uL — ABNORMAL HIGH (ref 0.00–0.07)
Basophils Absolute: 0 10*3/uL (ref 0.0–0.1)
Basophils Relative: 0 %
Eosinophils Absolute: 0 10*3/uL (ref 0.0–0.5)
Eosinophils Relative: 0 %
HCT: 24.7 % — ABNORMAL LOW (ref 39.0–52.0)
Hemoglobin: 8.4 g/dL — ABNORMAL LOW (ref 13.0–17.0)
Immature Granulocytes: 1 %
Lymphocytes Relative: 15 %
Lymphs Abs: 1.5 10*3/uL (ref 0.7–4.0)
MCH: 31 pg (ref 26.0–34.0)
MCHC: 34 g/dL (ref 30.0–36.0)
MCV: 91.1 fL (ref 80.0–100.0)
Monocytes Absolute: 0.8 10*3/uL (ref 0.1–1.0)
Monocytes Relative: 8 %
Neutro Abs: 7.6 10*3/uL (ref 1.7–7.7)
Neutrophils Relative %: 76 %
Platelet Count: 88 10*3/uL — ABNORMAL LOW (ref 150–400)
RBC: 2.71 MIL/uL — ABNORMAL LOW (ref 4.22–5.81)
RDW: 15.6 % — ABNORMAL HIGH (ref 11.5–15.5)
WBC Count: 10 10*3/uL (ref 4.0–10.5)
nRBC: 0 % (ref 0.0–0.2)

## 2019-10-20 LAB — CMP (CANCER CENTER ONLY)
ALT: 23 U/L (ref 0–44)
AST: 17 U/L (ref 15–41)
Albumin: 3.5 g/dL (ref 3.5–5.0)
Alkaline Phosphatase: 100 U/L (ref 38–126)
Anion gap: 10 (ref 5–15)
BUN: 24 mg/dL — ABNORMAL HIGH (ref 8–23)
CO2: 25 mmol/L (ref 22–32)
Calcium: 9 mg/dL (ref 8.9–10.3)
Chloride: 98 mmol/L (ref 98–111)
Creatinine: 1.4 mg/dL — ABNORMAL HIGH (ref 0.61–1.24)
GFR, Est AFR Am: 58 mL/min — ABNORMAL LOW (ref >60)
GFR, Estimated: 50 mL/min — ABNORMAL LOW (ref >60)
Glucose, Bld: 170 mg/dL — ABNORMAL HIGH (ref 70–99)
Potassium: 4.4 mmol/L (ref 3.5–5.1)
Sodium: 133 mmol/L — ABNORMAL LOW (ref 135–145)
Total Bilirubin: 0.4 mg/dL (ref 0.3–1.2)
Total Protein: 7 g/dL (ref 6.5–8.1)

## 2019-10-20 LAB — MAGNESIUM: Magnesium: 1.7 mg/dL (ref 1.7–2.4)

## 2019-10-20 MED ORDER — SODIUM CHLORIDE 0.9% FLUSH
10.0000 mL | INTRAVENOUS | Status: DC | PRN
  Administered 2019-10-20: 10:00:00 10 mL
  Filled 2019-10-20: qty 10

## 2019-10-20 MED ORDER — IOHEXOL 300 MG/ML  SOLN
100.0000 mL | Freq: Once | INTRAMUSCULAR | Status: AC | PRN
  Administered 2019-10-20: 11:00:00 100 mL via INTRAVENOUS

## 2019-10-20 MED ORDER — SODIUM CHLORIDE 0.9 % IV SOLN
INTRAVENOUS | Status: AC
  Filled 2019-10-20 (×2): qty 250

## 2019-10-20 MED ORDER — SODIUM CHLORIDE 0.9% FLUSH
10.0000 mL | INTRAVENOUS | Status: DC | PRN
  Administered 2019-10-20: 14:00:00 10 mL
  Filled 2019-10-20: qty 10

## 2019-10-20 MED ORDER — HEPARIN SOD (PORK) LOCK FLUSH 100 UNIT/ML IV SOLN
500.0000 [IU] | Freq: Once | INTRAVENOUS | Status: AC | PRN
  Administered 2019-10-20: 14:00:00 500 [IU]
  Filled 2019-10-20: qty 5

## 2019-10-20 MED ORDER — SODIUM CHLORIDE (PF) 0.9 % IJ SOLN
INTRAMUSCULAR | Status: AC
  Filled 2019-10-20: qty 50

## 2019-10-20 NOTE — Patient Instructions (Signed)
Rehydration, Adult Rehydration is the replacement of body fluids and salts and minerals (electrolytes) that are lost during dehydration. Dehydration is when there is not enough fluid or water in the body. This happens when you lose more fluids than you take in. Common causes of dehydration include:  Vomiting.  Diarrhea.  Excessive sweating, such as from heat exposure or exercise.  Taking medicines that cause the body to lose excess fluid (diuretics).  Impaired kidney function.  Not drinking enough fluid.  Certain illnesses or infections.  Certain poorly controlled long-term (chronic) illnesses, such as diabetes, heart disease, and kidney disease.  Symptoms of mild dehydration may include thirst, dry lips and mouth, dry skin, and dizziness. Symptoms of severe dehydration may include increased heart rate, confusion, fainting, and not urinating. You can rehydrate by drinking certain fluids or getting fluids through an IV tube, as told by your health care provider. What are the risks? Generally, rehydration is safe. However, one problem that can happen is taking in too much fluid (overhydration). This is rare. If overhydration happens, it can cause an electrolyte imbalance, kidney failure, or a decrease in salt (sodium) levels in the body. How to rehydrate Follow instructions from your health care provider for rehydration. The kind of fluid you should drink and the amount you should drink depend on your condition.  If directed by your health care provider, drink an oral rehydration solution (ORS). This is a drink designed to treat dehydration that is found in pharmacies and retail stores. ? Make an ORS by following instructions on the package. ? Start by drinking small amounts, about  cup (120 mL) every 5-10 minutes. ? Slowly increase how much you drink until you have taken the amount recommended by your health care provider.  Drink enough clear fluids to keep your urine clear or pale  yellow. If you were instructed to drink an ORS, finish the ORS first, then start slowly drinking other clear fluids. Drink fluids such as: ? Water. Do not drink only water. Doing that can lead to having too little sodium in your body (hyponatremia). ? Ice chips. ? Fruit juice that you have added water to (diluted juice). ? Low-calorie sports drinks.  If you are severely dehydrated, your health care provider may recommend that you receive fluids through an IV tube in the hospital.  Do not take sodium tablets. Doing that can lead to the condition of having too much sodium in your body (hypernatremia). Eating while you rehydrate Follow instructions from your health care provider about what to eat while you rehydrate. Your health care provider may recommend that you slowly begin eating regular foods in small amounts.  Eat foods that contain a healthy balance of electrolytes, such as bananas, oranges, potatoes, tomatoes, and spinach.  Avoid foods that are greasy or contain a lot of fat or sugar.  In some cases, you may get nutrition through a feeding tube that is passed through your nose and into your stomach (nasogastric tube, or NG tube). This may be done if you have uncontrolled vomiting or diarrhea. Beverages to avoid Certain beverages may make dehydration worse. While you rehydrate, avoid:  Alcohol.  Caffeine.  Drinks that contain a lot of sugar. These include: ? High-calorie sports drinks. ? Fruit juice that is not diluted. ? Soda.  Check nutrition labels to see how much sugar or caffeine a beverage contains. Signs of dehydration recovery You may be recovering from dehydration if:  You are urinating more often than before you started   rehydrating.  Your urine is clear or pale yellow.  Your energy level improves.  You vomit less frequently.  You have diarrhea less frequently.  Your appetite improves or returns to normal.  You feel less dizzy or less light-headed.  Your  skin tone and color start to look more normal. Contact a health care provider if:  You continue to have symptoms of mild dehydration, such as: ? Thirst. ? Dry lips. ? Slightly dry mouth. ? Dry, warm skin. ? Dizziness.  You continue to vomit or have diarrhea. Get help right away if:  You have symptoms of dehydration that get worse.  You feel: ? Confused. ? Weak. ? Like you are going to faint.  You have not urinated in 6-8 hours.  You have very dark urine.  You have trouble breathing.  Your heart rate while sitting still is over 100 beats a minute.  You cannot drink fluids without vomiting.  You have vomiting or diarrhea that: ? Gets worse. ? Does not go away.  You have a fever. This information is not intended to replace advice given to you by your health care provider. Make sure you discuss any questions you have with your health care provider. Document Revised: 08/29/2017 Document Reviewed: 11/10/2015 Elsevier Patient Education  2020 [employer]  Coronavirus (COVID-19) Are you at risk?  Are you at risk for the Coronavirus (COVID-19)?  To be considered HIGH RISK for Coronavirus (COVID-19), you have to meet the following criteria:  . Traveled to [place], [place], [place], [place] or [place]; or in the [place] to [place], [place], [place], or [place]; and have fever, cough, and shortness of breath within the last 2 weeks of travel OR . Been in close contact with a person diagnosed with COVID-19 within the last 2 weeks and have fever, cough, and shortness of breath . IF YOU DO NOT MEET THESE CRITERIA, YOU ARE CONSIDERED LOW RISK FOR COVID-19.  What to do if you are HIGH RISK for COVID-19?  . If you are having a medical emergency, call 911. . Seek medical care right away. Before you go to a doctor's office, urgent care or emergency department, call ahead and tell them about your recent travel, contact with someone diagnosed with COVID-19, and your  symptoms. You should receive instructions from your physician's office regarding next steps of care.  . When you arrive at healthcare provider, tell the healthcare staff immediately you have returned from visiting [place], [place], [place], [place] or [place]; or traveled in the [place] to [place], [place], [place], or [place]; in the last two weeks or you have been in close contact with a person diagnosed with COVID-19 in the last 2 weeks.   . Tell the health care staff about your symptoms: fever, cough and shortness of breath. . After you have been seen by a medical provider, you will be either: o Tested for (COVID-19) and discharged home on quarantine except to seek medical care if symptoms worsen, and asked to  - Stay home and avoid contact with others until you get your results (4-5 days)  - Avoid travel on public transportation if possible (such as bus, train, or airplane) or o Sent to the Emergency Department by EMS for evaluation, COVID-19 testing, and possible admission depending on your condition and test results.  What to do if you are LOW RISK for COVID-19?  Reduce your risk of any infection by using the same precautions used for avoiding   the common cold or flu:  . Wash your hands often with soap and warm water for at least 20 seconds.  If soap and water are not readily available, use an alcohol-based hand sanitizer with at least 60% alcohol.  . If coughing or sneezing, cover your mouth and nose by coughing or sneezing into the elbow areas of your shirt or coat, into a tissue or into your sleeve (not your hands). . Avoid shaking hands with others and consider head nods or verbal greetings only. . Avoid touching your eyes, nose, or mouth with unwashed hands.  . Avoid close contact with people who are sick. . Avoid places or events with large numbers of people in one location, like concerts or sporting events. . Carefully consider travel plans you have or are making. . If you  are planning any travel outside or inside the [place], visit the CDC's Travelers' Health webpage for the latest health notices. . If you have some symptoms but not all symptoms, continue to monitor at home and seek medical attention if your symptoms worsen. . If you are having a medical emergency, call 911.   ADDITIONAL HEALTHCARE OPTIONS FOR PATIENTS  [facility] / e-Visit: [email or web address]         [facility]: [phone]   Urgent Care: [phone]                   [facility]: [phone]   

## 2019-10-20 NOTE — Progress Notes (Signed)
Power port 20 G needle used for chest PAC, remained accessed for radiology scan.  Alerted Taylor in radiology dept of port accessed and to leave pt accessed at end of scan for IVF appt afterwards, verbalized understanding.

## 2019-10-20 NOTE — Patient Instructions (Signed)

## 2019-10-22 ENCOUNTER — Inpatient Hospital Stay (HOSPITAL_BASED_OUTPATIENT_CLINIC_OR_DEPARTMENT_OTHER): Payer: Medicare Other | Admitting: Hematology

## 2019-10-22 ENCOUNTER — Encounter: Payer: Self-pay | Admitting: Hematology

## 2019-10-22 DIAGNOSIS — C221 Intrahepatic bile duct carcinoma: Secondary | ICD-10-CM | POA: Diagnosis not present

## 2019-10-22 MED ORDER — OXYCODONE HCL 5 MG PO CAPS: 5.0000 mg | ORAL_CAPSULE | Freq: Four times a day (QID) | ORAL | 0 refills | Status: DC | PRN

## 2019-10-22 NOTE — Addendum Note (Signed)
Addended by: [name] on: 10/22/2019 11:37 PM   Modules accepted: Orders

## 2019-10-25 ENCOUNTER — Telehealth: Payer: Self-pay | Admitting: Hematology

## 2019-10-25 DIAGNOSIS — C250 Malignant neoplasm of head of pancreas: Secondary | ICD-10-CM | POA: Diagnosis not present

## 2019-10-25 DIAGNOSIS — E109 Type 1 diabetes mellitus without complications: Secondary | ICD-10-CM | POA: Diagnosis not present

## 2019-10-25 DIAGNOSIS — C221 Intrahepatic bile duct carcinoma: Secondary | ICD-10-CM | POA: Diagnosis not present

## 2019-10-25 NOTE — Telephone Encounter (Signed)
Per 1/22 los thoracentesis next week

## 2019-10-27 ENCOUNTER — Other Ambulatory Visit (HOSPITAL_COMMUNITY)
Admission: RE | Admit: 2019-10-27 | Discharge: 2019-10-27 | Disposition: A | Discharge status: Home / Self Care | Payer: Medicare Other | Source: Ambulatory Visit | Attending: Hematology | Admitting: Hematology

## 2019-10-27 DIAGNOSIS — Z01812 Encounter for preprocedural laboratory examination: Secondary | ICD-10-CM | POA: Insufficient documentation

## 2019-10-27 DIAGNOSIS — Z20822 Contact with and (suspected) exposure to covid-19: Secondary | ICD-10-CM | POA: Diagnosis not present

## 2019-10-27 LAB — SARS CORONAVIRUS 2 (TAT 6-24 HRS): SARS Coronavirus 2: NEGATIVE

## 2019-10-28 ENCOUNTER — Other Ambulatory Visit: Payer: Medicare Other

## 2019-10-28 ENCOUNTER — Ambulatory Visit: Payer: Medicare Other | Admitting: Hematology

## 2019-10-28 ENCOUNTER — Ambulatory Visit: Payer: Medicare Other

## 2019-10-29 ENCOUNTER — Other Ambulatory Visit: Payer: Self-pay

## 2019-10-29 ENCOUNTER — Ambulatory Visit (HOSPITAL_COMMUNITY)
Admission: RE | Admit: 2019-10-29 | Discharge: 2019-10-29 | Disposition: A | Discharge status: Home / Self Care | Payer: Medicare Other | Source: Ambulatory Visit | Attending: Hematology | Admitting: Hematology

## 2019-10-29 ENCOUNTER — Other Ambulatory Visit: Payer: Self-pay | Admitting: Hematology

## 2019-10-29 DIAGNOSIS — C221 Intrahepatic bile duct carcinoma: Secondary | ICD-10-CM

## 2019-10-29 DIAGNOSIS — Z9889 Other specified postprocedural states: Secondary | ICD-10-CM

## 2019-10-29 DIAGNOSIS — J910 Malignant pleural effusion: Secondary | ICD-10-CM | POA: Diagnosis not present

## 2019-10-29 DIAGNOSIS — J948 Other specified pleural conditions: Secondary | ICD-10-CM | POA: Diagnosis not present

## 2019-10-29 DIAGNOSIS — J90 Pleural effusion, not elsewhere classified: Secondary | ICD-10-CM | POA: Diagnosis not present

## 2019-10-29 HISTORY — PX: IR THORACENTESIS ASP PLEURAL SPACE W/IMG GUIDE: IMG5380

## 2019-10-29 MED ORDER — LIDOCAINE HCL (PF) 1 % IJ SOLN
INTRAMUSCULAR | Status: DC | PRN
  Administered 2019-10-29: 10 mL

## 2019-10-29 MED ORDER — LIDOCAINE HCL 1 % IJ SOLN
INTRAMUSCULAR | Status: AC
  Filled 2019-10-29: qty 20

## 2019-10-29 NOTE — Procedures (Signed)
PROCEDURE SUMMARY:  Successful US guided right diagnostic and therapeutic thoracentesis. Yielded 1.3 liters of yellow fluid. Pt tolerated procedure well. No immediate complications.  Specimen was sent for labs. CXR ordered.  EBL < 5 mL  Docia Barrier PA-C 10/29/2019 10:08 AM

## 2019-11-01 DIAGNOSIS — E119 Type 2 diabetes mellitus without complications: Secondary | ICD-10-CM | POA: Diagnosis not present

## 2019-11-01 DIAGNOSIS — H2513 Age-related nuclear cataract, bilateral: Secondary | ICD-10-CM | POA: Diagnosis not present

## 2019-11-01 DIAGNOSIS — H35033 Hypertensive retinopathy, bilateral: Secondary | ICD-10-CM | POA: Diagnosis not present

## 2019-11-01 LAB — CYTOLOGY - NON PAP

## 2019-11-03 NOTE — Progress Notes (Addendum)
[place]   Telephone:(336) [phone] Fax:(336) [phone]   Clinic Follow up Note   Patient Care Team: [name], DO as PCP - General (Family Medicine) 11/04/2019  CHIEF COMPLAINT: F/u cholangiocarcinoma   SUMMARY OF ONCOLOGIC HISTORY: Oncology History  Cholangiocarcinoma ([place])  01/14/2019 Imaging   US Abdomen 01/14/19  IMPRESSION: There is significant intrahepatic and extrahepatic biliary dilatation present, concerning for distal common bile duct obstruction. Further evaluation with CT scan or MRCP is recommended. Correlation with liver function tests is recommended as well. These results will be called to the ordering clinician or representative by the Radiologist Assistant, and communication documented in the PACS or zVision Dashboard.   Probable large amount of sludge seen within gallbladder lumen with mild gallbladder wall thickening. However, no cholelithiasis, pericholecystic fluid or sonographic Murphy's sign is noted.   4.2 cm septated cyst seen in upper pole of right kidney consistent with Bosniak type 2 lesion. Follow-up ultrasound in 1 year is recommended to ensure stability.   01/20/2019 Imaging   MRI abdomen 01/20/19  IMPRESSION: 1. Findings are highly concerning for central tumor in the biliary tract at the confluence of the common hepatic duct, cystic duct and common bile ducts. Further clinical evaluation for potential cholangiocarcinoma is strongly recommended. 2. Mild ductal dilatation of the main pancreatic duct throughout the distal body and tail of the pancreas where there is also some associated side branch ectasia. This may suggest a pancreatic ductal stricture. No obstructing pancreatic neoplasm identified. 3. Aortic atherosclerosis.   01/21/2019 Initial Biopsy   Diagnosis 01/21/19  BILE DUCT BRUSHING (SPECIMEN 1 OF 1 COLLECTED 01/21/2019) ADENOCARCINOMA.   01/21/2019 Procedure   ERCP By Dr hung 01/21/19 IMRPESSION - The major  papilla appeared normal. - A single localized biliary stricture was found in the middle third of the main bile duct. - The entire main bile duct and upper third of the main bile duct were dilated, secondary to a stricture. - A biliary sphincterotomy was performed. - Cells for cytology obtained in the middle third of the main bile duct. - One plastic stent was placed into the common bile duct.  EUS by Dr hung 01/21/19  IMPRESSION - There was dilation in the middle third of the main bile duct and in the upper third of the main bile duct which measured up to 15 mm. - There was a suggestion of a stricture in the middle third of the main bile duct. - No specimens collected.   02/03/2019 Imaging   CT CAP at [facility] 02/03/19  Impression:  1. There is mild intrahepatic and extrahepatic biliary ductal dilation and diffuse common bile duct wall thickening with stent in place. There is abnormal soft tissue measuring approximately 1.6 cm between the common hepatic artery, portal vein and common bile duct, worrisome for the known cholangiocarcinoma.  2. Periportal lymphadenopathy which may represent nodal metastasis.  4. Marked pancreatic atrophy and duct dilation involving the distal body and tail the pancreas where there is a coarse calcification. These findings are favored to represent stenosis from intraductal stones though an underlying stricture cannot be completely excluded.  5. Atypical symmetric prominent fat stranding of the bilateral lower abdominal wall. Correlate with surgical history or history of history of trauma.   03/08/2019 Initial Diagnosis   Cholangiocarcinoma ([place])   03/12/2019 -  Chemotherapy   gemcitabine and cisplatin on days 1 and 8 every 21 days starting 03/12/19. Abraxane added with cycle 2. Added GCSF [name]) to day 9 starting cycle 2.  03/19/2019 Cancer Staging   Staging form: Perihilar Bile Ducts, AJCC 8th Edition - Clinical: Stage Unknown (cTX, cN0, cM0) -  Signed by [name], MD on 03/19/2019   05/24/2019 Imaging   CT CAP W Contrast at [facility]  1.  Interval placement of covered metallic biliary stent. No progressive dilation of the biliary tree and resulting resolution of the prior main pancreatic duct dilation. 2.  Evidence of some subtle vascular contour deformity involving the hepatic artery, suspected to be from tumoral contact and/or posttreatment changes. 3.  New small right pleural effusion with overlying mild airspace disease. Secondary findings which may indicate a component of aspiration. 4.  No convincing evidence of new disseminated metastatic disease. 5.  Additional findings as discussed above.   06/18/2019 Imaging   CT AP IMPRESSION: 1.  No acute findings in the abdomen/pelvis. 2. Biliary stent in adequate position. No evidence of focal mass or adenopathy in the region of the pancreatic head or porta hepatis. 3. Moderate size right pleural effusion with associated right basilar atelectasis. 4. Possible single gallstone. Stable subcentimeter liver cyst. Stable right renal cysts. Small left inguinal hernia containing only peritoneal fat. 5. Aortic Atherosclerosis (ICD10-I70.0).   07/28/2019 Imaging   CT CAP IMPRESSION: 1. The patient is status post common bile duct stent placement, with unchanged stent position and patent appearance with pneumobilia. Primary cholangiocarcinoma is not discretely appreciated by CT. 2. No direct evidence of metastatic disease in the chest, abdomen, or pelvis. 3. Moderate right pleural effusion with associated atelectasis or consolidation, slightly improved compared to prior examination. 4. The pancreatic parenchyma is atrophic and calcified, particularly in the pancreatic tail. 5.  Coronary artery disease.  Aortic atherosclerosis   10/20/2019 PET scan   IMPRESSION: 1. Redemonstrated post treatment and post stenting appearance of a central cholangiocarcinoma, which is not directly appreciated by  CT. Common bile duct stent remains in position with post stenting pneumobilia. 2. Slight interval increase in a moderate to large right pleural effusion with associated atelectasis or consolidation. There may be some pleural nodularity about the right hemidiaphragm which appears new compared to prior examination (series 2, image 40), highly suspicious for pleural metastatic disease. 3. No direct evidence of metastatic disease in the chest, abdomen, or pelvis. 4. Coronary artery disease.  Aortic Atherosclerosis (ICD10-I70.0).     10/29/2019 Pathology Results   Thoracentesis FINAL MICROSCOPIC DIAGNOSIS:  - Reactive mesothelial cells present      CURRENT THERAPY:  Gemcitabine and cisplatin on days 1 and 8 every 21 daysstarting 03/12/19. Abraxane added with cycle 2.AddedGCSF ([name])to day 9 startingcycle 2.Chemo held since 06/03/19 due to poor toleration. Chemo restarted with Gem/Cis with C6 on 07/02/19.  INTERVAL HISTORY: Mr. Ohlin returns for f/u as scheduled. He completed cycle 10 chemo on 10/14/19 with [name] on 1/16. He underwent thoracentesis on 1/29 for cytology which showed reactive mesothelial cells. He feels much better after 1 L removed in thoracentesis, no cough, chest pain, or dyspnea. Energy and appetite are "fine." He remains up, active, and independent at home. Has "very little" n/v, zofran and compazine have been more effective lately. Denies constipation or diarrhea. No fever, chills, leg swelling, neuropathy, or abdominal pain. Has not take oxycodone in quite some time.     MEDICAL HISTORY:  Past Medical History:  Diagnosis Date  . Diabetes mellitus type I, controlled ([place])   . Dyslipidemia   . ED (erectile dysfunction)   . Essential hypertension   . Gilbert's disease   . Hepatitis B carrier (  [place])     SURGICAL HISTORY: Past Surgical History:  Procedure Laterality Date  . APPENDECTOMY  1964  . BILIARY BRUSHING  01/21/2019   Procedure: BILIARY BRUSHING;   Surgeon: Carol Ada, MD;  Location: [facility];  Service: Endoscopy;;  . BILIARY STENT PLACEMENT N/A 01/21/2019   Procedure: BILIARY STENT PLACEMENT;  Surgeon: Carol Ada, MD;  Location: [facility];  Service: Endoscopy;  Laterality: N/A;  . ENDOSCOPIC RETROGRADE CHOLANGIOPANCREATOGRAPHY (ERCP) WITH PROPOFOL N/A 01/21/2019   Procedure: ENDOSCOPIC RETROGRADE CHOLANGIOPANCREATOGRAPHY (ERCP) WITH PROPOFOL;  Surgeon: Carol Ada, MD;  Location: [facility];  Service: Endoscopy;  Laterality: N/A;  . ESOPHAGOGASTRODUODENOSCOPY (EGD) WITH PROPOFOL N/A 01/21/2019   Procedure: ESOPHAGOGASTRODUODENOSCOPY (EGD) WITH PROPOFOL;  Surgeon: Carol Ada, MD;  Location: [facility];  Service: Endoscopy;  Laterality: N/A;  . FEMORAL HERNIA REPAIR    . IR CHOLANGIOGRAM EXISTING TUBE  05/13/2019  . IR CHOLANGIOGRAM EXISTING TUBE  06/08/2019  . IR EXCHANGE BILIARY DRAIN  05/18/2019  . IR EXCHANGE BILIARY DRAIN  06/08/2019  . IR IMAGING GUIDED PORT INSERTION  03/08/2019  . IR PERC CHOLECYSTOSTOMY  05/04/2019  . IR THORACENTESIS ASP PLEURAL SPACE W/IMG GUIDE  10/29/2019  . LEFT HEART CATH AND CORONARY ANGIOGRAPHY N/A 05/19/2017   Procedure: LEFT HEART CATH AND CORONARY ANGIOGRAPHY;  Surgeon: Leonie Man, MD;  Location: [facility];  Service: Cardiovascular;  Laterality: N/A;  . SPHINCTEROTOMY  01/21/2019   Procedure: SPHINCTEROTOMY;  Surgeon: Carol Ada, MD;  Location: [facility];  Service: Endoscopy;;  . UPPER ESOPHAGEAL ENDOSCOPIC ULTRASOUND (EUS) N/A 01/21/2019   Procedure: UPPER ESOPHAGEAL ENDOSCOPIC ULTRASOUND (EUS);  Surgeon: Carol Ada, MD;  Location: [facility];  Service: Endoscopy;  Laterality: N/A;    I have reviewed the social history and family history with the patient and they are unchanged from previous note.  ALLERGIES:  is allergic to erythromycin.  MEDICATIONS:  Current Outpatient Medications  Medication Sig Dispense Refill  . acetaminophen (TYLENOL) 500 MG tablet Take  1,000 mg by mouth 2 (two) times daily as needed for moderate pain or headache.    Marland Kitchen amLODipine (NORVASC) 5 MG tablet Take 5 mg by mouth daily.     Marland Kitchen atenolol (TENORMIN) 25 MG tablet Take 25 mg by mouth daily. Once a day     . atorvastatin (LIPITOR) 10 MG tablet Take 10 mg by mouth daily.    . Blood Glucose Monitoring Suppl (ACCU-CHEK NANO SMARTVIEW) W/DEVICE KIT 1 kit by Does not apply route 2 (two) times daily. (Patient taking differently: 1 kit by Does not apply route See admin instructions. Test blood sugars 12x's daily) 1 kit 0  . glucose blood test strip Test 3 times a day. (Patient taking differently: 1 each by Other route See admin instructions. Test 12 times a day.) 300 each Prn  . insulin NPH Human (NOVOLIN N) 100 UNIT/ML injection Inject 20 Units into the skin at bedtime.     . insulin regular (NOVOLIN R) 100 units/mL injection Inject 20 Units into the skin 3 (three) times daily before meals.     . lidocaine-prilocaine (EMLA) cream Apply to affected area once 30 g 3  . lisinopril (ZESTRIL) 20 MG tablet Take 20 mg by mouth daily.     Marland Kitchen LORazepam (ATIVAN) 0.5 MG tablet Take 0.5-1 tablets (0.25-0.5 mg total) by mouth every 8 (eight) hours as needed (nausea and vomiting). 30 tablet 0  . magnesium oxide (MAG-OX) 400 MG tablet Take 1 tablet (400 mg total) by mouth daily. 30 tablet  2  . metFORMIN (GLUCOPHAGE) 1000 MG tablet Take 2,000 mg by mouth every evening.     . ondansetron (ZOFRAN) 8 MG tablet TAKE 1 TABLET BY MOUTH 2 TIMES DAY AS NEEDED. START ON 3RD DAY AFTER CHEMOTHERAPY 30 tablet 1  . prochlorperazine (COMPAZINE) 10 MG tablet TAKE 1 TABLET (10 MG TOTAL) BY MOUTH EVERY 6 (SIX) HOURS AS NEEDED (NAUSEA OR VOMITING). 30 tablet 1  . zolpidem (AMBIEN) 5 MG tablet Take 1-2 tablets (5-10 mg total) by mouth at bedtime as needed for sleep. 60 tablet 0  . oxycodone (OXY-IR) 5 MG capsule Take 1 capsule (5 mg total) by mouth every 6 (six) hours as needed for pain (severe pain). 20 capsule 0   No  current facility-administered medications for this visit.   Facility-Administered Medications Ordered in Other Visits  Medication Dose Route Frequency Provider Last Rate Last Admin  . 0.9 %  sodium chloride infusion  1,000 mL Intravenous Once [name], MD      . gemcitabine (GEMZAR) 1,444 mg in sodium chloride 0.9 % 250 mL chemo infusion  800 mg/m2 (Treatment Plan Recorded) Intravenous Once [name], MD      . heparin lock flush 100 unit/mL  500 Units Intracatheter Once PRN [name], MD      . sodium chloride flush (NS) 0.9 % injection 10 mL  10 mL Intracatheter PRN [name], MD        PHYSICAL EXAMINATION: ECOG PERFORMANCE STATUS: 1 - Symptomatic but completely ambulatory  Vitals:   11/04/19 0828  BP: 124/66  Pulse: 74  Resp: 18  Temp: 98 F (36.7 C)  SpO2: 97%   Filed Weights   11/04/19 0828  Weight: 144 lb 8 oz (65.5 kg)    GENERAL:alert, no distress and comfortable SKIN: no rash  EYES:  sclera clear LYMPH:  no palpable cervical or supraclavicular lymphadenopathy LUNGS: clear with normal breathing effort HEART: regular rate & rhythm, no lower extremity edema ABDOMEN:abdomen soft, non-tender and normal bowel sounds. No hepatomegaly  NEURO: alert & oriented x 3 with fluent speech, no focal motor/sensory deficits PAC without erythema   LABORATORY DATA:  I have reviewed the data as listed CBC Latest Ref Rng & Units 11/04/2019 10/20/2019 10/14/2019  WBC 4.0 - 10.5 K/uL 8.9 10.0 6.2  Hemoglobin 13.0 - 17.0 g/dL 9.2(L) 8.4(L) 8.9(L)  Hematocrit 39.0 - 52.0 % 27.7(L) 24.7(L) 26.2(L)  Platelets 150 - 400 K/uL 408(H) 88(L) 263     CMP Latest Ref Rng & Units 11/04/2019 10/20/2019 10/14/2019  Glucose 70 - 99 mg/dL 219(H) 170(H) 188(H)  BUN 8 - 23 mg/dL 27(H) 24(H) 22  Creatinine 0.61 - 1.24 mg/dL 1.78(H) 1.40(H) 1.47(H)  Sodium 135 - 145 mmol/L 133(L) 133(L) 135  Potassium 3.5 - 5.1 mmol/L 4.8 4.4 4.0  Chloride 98 - 111 mmol/L 98 98 99  CO2 22 - 32 mmol/L 27 25 23   Calcium  8.9 - 10.3 mg/dL 9.1 9.0 8.8(L)  Total Protein 6.5 - 8.1 g/dL 6.8 7.0 6.9  Total Bilirubin 0.3 - 1.2 mg/dL 0.3 0.4 0.3  Alkaline Phos 38 - 126 U/L 53 100 70  AST 15 - 41 U/L 18 17 21   ALT 0 - 44 U/L 17 23 19       RADIOGRAPHIC STUDIES: I have personally reviewed the radiological images as listed and agreed with the findings in the report. No results found.   ASSESSMENT & PLAN: Christopher Welch a 74 y.o.malewith   1. Extrahepatic cholangiocarcinoma, cTxN0M0 -He wasdiagnosedin  12/2018; presented withmalignant stricture and CBD obstruction required stenting. Brushing revealed adenocarcinoma. He underwent attempted whipple per Dr. Zenia Resides at [facility] which was aborted due to vascular invasion -now followed by Dr. Carlis Abbott at [facility], last visit on 1/25 -He was referred to our clinic to initiate chemotherapy in the neoadjuvant setting. -He proceeded with neoadjuvantgemcitabine and cisplatin on days 1 and 8 every 21 daysstarting 03/12/19. Abraxane added with cycle 2.AddedGCSF ([name])to day 9 startingcycle 2. -Hetolerated first few cycles very well without significant side effects;tumor marker CA 19.9dropped significantly as he started chemo, indicating good response to chemo treatment -his performance status decreasedandabraxane was held with cycle 4  -remains able to tolerate gem/cis on days 1 and 8 q21 days well overall with intermittent fatigue and nausea but able to recover well  -restaging CT CAP on 10/20/19 showed stable disease in the liver but with increased right pleural effusion and nodularity along the pleura, concerncing for pleural metastasis.  -He underwent diagnostic and therapeutic thoracentesis on 10/29/19, path showed reactive mesothelial cells   2. CINV, constipation -nausea worsened secondary to chemotherapy and recent constipation episode in 08/2019  -low dose ativan was added for increased n/v which he has not required lately. zofran and compazine are  adequate -will try to avoid decadron due to DM  3. Acute cholecystitis -Hospitalizedon 05/03/19; s/ppercutaneous gallbladder drainage tube placed by IR8/5/20. -completed antibiotics -tube exchanged 8/18 due to leakagewhich has resolved, his abdominal pain improved until it began leaking again on 9/8. IR could not exchange the tube as the gallbladder was completely decompressed and it was removed -[place] on 9/18 showed no evidence of cholecystitis -no clinical evidence of recurrent cholecystitis today    4. Weight lossand low appetite -secondary to malignancyand chemo; he presented with 20 lbs weight loss  -Continue to f/u with dietician -His appetitehas dropped significantly when he had cholecystitis.  -continue supplements, weight continues to fluctuate   5. DM -managed by PCP Dr. Theda Sers -he has had DM for 20 years, he is compliant with regimen and knows how to adjust BG -on insulin, he knows how to adjust dose based on his BG level  6. CAD, HL, HTN -followed by cardiologist Dr. Ellyn Hack -on amlodipine, atenolol, lisinopril, and statin -BP stable   7. Social support -he is single, no children, he lives alone. He has a good friend whochecks on him -followed by SW, declined any needs or intervention today  8.Insomnia -He is able to fall asleep but has trouble staying asleep -tried Z-quil and ambien but stopped due to lack of efficacy   Disposition:  Mr. Bircher appears stable. He completed 10 cycles of gemcitabine and cisplatin, tolerating well overall. Dyspnea improved after 1 L removed s/p right thoracentesis on 10/29/19. I reviewed cytology report with him which shows reactive mesothelial cells. However, due to recurrent nature and CT findings, this is still concerning for malignancy. We are recommending further exploration with PET scan, he agrees.   Labs reviewed, CBC is stable. Mild thrombocytosis is likely reactive. Mg is normal. CA 19-9 is pending. He has  worsening renal function on CMP, Cr up to 1.78 with Crcl 35. The patient was seen with Dr. Burr Medico who recommends to hold cisplatin today and next week due to renal toxicity. He will proceed with hydration fluids and single agent gemcitabine today and next week day 8. F/u in 3 weeks to review PET scan and next cycle.   Orders Placed This Encounter  Procedures  . NM PET Image Initial (PI) Skull  Base To Thigh    Standing Status:   Future    Standing Expiration Date:   11/03/2020    Order Specific Question:   ** REASON FOR EXAM (FREE TEXT)    Answer:   evaluate pleural nodularity, r/o metastatic disease    Order Specific Question:   If indicated for the ordered procedure, I authorize the administration of a radiopharmaceutical per Radiology protocol    Answer:   Yes    Order Specific Question:   Preferred imaging location?    Answer:   [facility]    Order Specific Question:   Radiology Contrast Protocol - do NOT remove file path    Answer:   [email or web address]   All questions were answered. The patient knows to call the clinic with any problems, questions or concerns. No barriers to learning was detected.     Alla Feeling, NP 11/04/19   Addendum  I have seen the patient, examined him. I agree with the assessment and and plan and have edited the notes.   Although his recently cytology was negative, I still suspect his pleural effusion could be malignant.  I recommend PET scan for further evaluation.  Lab reviewed, creatinine has further increased to 1.78 today, will hold cisplatin today, and proceed with single agent gemcitabine.  We will give 1 L normal saline for hydration today.   I will call him after his PET scan.  [name]  11/04/2019

## 2019-11-04 ENCOUNTER — Inpatient Hospital Stay (HOSPITAL_BASED_OUTPATIENT_CLINIC_OR_DEPARTMENT_OTHER): Payer: Medicare Other | Admitting: Nurse Practitioner

## 2019-11-04 ENCOUNTER — Inpatient Hospital Stay: Payer: Medicare Other | Attending: Hematology

## 2019-11-04 ENCOUNTER — Inpatient Hospital Stay: Payer: Medicare Other

## 2019-11-04 ENCOUNTER — Encounter: Payer: Self-pay | Admitting: Nurse Practitioner

## 2019-11-04 ENCOUNTER — Other Ambulatory Visit: Payer: Medicare Other

## 2019-11-04 ENCOUNTER — Other Ambulatory Visit: Payer: Self-pay

## 2019-11-04 VITALS — BP 124/66 | HR 74 | Temp 98.0°F | Resp 18 | Ht 67.0 in | Wt 144.5 lb

## 2019-11-04 DIAGNOSIS — C782 Secondary malignant neoplasm of pleura: Secondary | ICD-10-CM | POA: Diagnosis not present

## 2019-11-04 DIAGNOSIS — E785 Hyperlipidemia, unspecified: Secondary | ICD-10-CM | POA: Insufficient documentation

## 2019-11-04 DIAGNOSIS — Z79899 Other long term (current) drug therapy: Secondary | ICD-10-CM | POA: Diagnosis not present

## 2019-11-04 DIAGNOSIS — E109 Type 1 diabetes mellitus without complications: Secondary | ICD-10-CM | POA: Diagnosis not present

## 2019-11-04 DIAGNOSIS — Z452 Encounter for adjustment and management of vascular access device: Secondary | ICD-10-CM | POA: Diagnosis not present

## 2019-11-04 DIAGNOSIS — C221 Intrahepatic bile duct carcinoma: Secondary | ICD-10-CM

## 2019-11-04 DIAGNOSIS — Z95828 Presence of other vascular implants and grafts: Secondary | ICD-10-CM

## 2019-11-04 DIAGNOSIS — B181 Chronic viral hepatitis B without delta-agent: Secondary | ICD-10-CM | POA: Diagnosis not present

## 2019-11-04 DIAGNOSIS — Z794 Long term (current) use of insulin: Secondary | ICD-10-CM | POA: Insufficient documentation

## 2019-11-04 DIAGNOSIS — G4700 Insomnia, unspecified: Secondary | ICD-10-CM | POA: Insufficient documentation

## 2019-11-04 DIAGNOSIS — Z5111 Encounter for antineoplastic chemotherapy: Secondary | ICD-10-CM | POA: Diagnosis not present

## 2019-11-04 DIAGNOSIS — I10 Essential (primary) hypertension: Secondary | ICD-10-CM | POA: Insufficient documentation

## 2019-11-04 DIAGNOSIS — R634 Abnormal weight loss: Secondary | ICD-10-CM | POA: Diagnosis not present

## 2019-11-04 DIAGNOSIS — I2510 Atherosclerotic heart disease of native coronary artery without angina pectoris: Secondary | ICD-10-CM | POA: Diagnosis not present

## 2019-11-04 DIAGNOSIS — J90 Pleural effusion, not elsewhere classified: Secondary | ICD-10-CM | POA: Insufficient documentation

## 2019-11-04 DIAGNOSIS — Z5189 Encounter for other specified aftercare: Secondary | ICD-10-CM | POA: Diagnosis present

## 2019-11-04 LAB — CMP (CANCER CENTER ONLY)
ALT: 17 U/L (ref 0–44)
AST: 18 U/L (ref 15–41)
Albumin: 3.3 g/dL — ABNORMAL LOW (ref 3.5–5.0)
Alkaline Phosphatase: 53 U/L (ref 38–126)
Anion gap: 8 (ref 5–15)
BUN: 27 mg/dL — ABNORMAL HIGH (ref 8–23)
CO2: 27 mmol/L (ref 22–32)
Calcium: 9.1 mg/dL (ref 8.9–10.3)
Chloride: 98 mmol/L (ref 98–111)
Creatinine: 1.78 mg/dL — ABNORMAL HIGH (ref 0.61–1.24)
GFR, Est AFR Am: 43 mL/min — ABNORMAL LOW (ref >60)
GFR, Estimated: 37 mL/min — ABNORMAL LOW (ref >60)
Glucose, Bld: 219 mg/dL — ABNORMAL HIGH (ref 70–99)
Potassium: 4.8 mmol/L (ref 3.5–5.1)
Sodium: 133 mmol/L — ABNORMAL LOW (ref 135–145)
Total Bilirubin: 0.3 mg/dL (ref 0.3–1.2)
Total Protein: 6.8 g/dL (ref 6.5–8.1)

## 2019-11-04 LAB — CBC WITH DIFFERENTIAL (CANCER CENTER ONLY)
Abs Immature Granulocytes: 0.07 10*3/uL (ref 0.00–0.07)
Basophils Absolute: 0.1 10*3/uL (ref 0.0–0.1)
Basophils Relative: 1 %
Eosinophils Absolute: 0 10*3/uL (ref 0.0–0.5)
Eosinophils Relative: 0 %
HCT: 27.7 % — ABNORMAL LOW (ref 39.0–52.0)
Hemoglobin: 9.2 g/dL — ABNORMAL LOW (ref 13.0–17.0)
Immature Granulocytes: 1 %
Lymphocytes Relative: 20 %
Lymphs Abs: 1.8 10*3/uL (ref 0.7–4.0)
MCH: 31.2 pg (ref 26.0–34.0)
MCHC: 33.2 g/dL (ref 30.0–36.0)
MCV: 93.9 fL (ref 80.0–100.0)
Monocytes Absolute: 1.6 10*3/uL — ABNORMAL HIGH (ref 0.1–1.0)
Monocytes Relative: 18 %
Neutro Abs: 5.4 10*3/uL (ref 1.7–7.7)
Neutrophils Relative %: 60 %
Platelet Count: 408 10*3/uL — ABNORMAL HIGH (ref 150–400)
RBC: 2.95 MIL/uL — ABNORMAL LOW (ref 4.22–5.81)
RDW: 15.5 % (ref 11.5–15.5)
WBC Count: 8.9 10*3/uL (ref 4.0–10.5)
nRBC: 0 % (ref 0.0–0.2)

## 2019-11-04 LAB — MAGNESIUM: Magnesium: 2.1 mg/dL (ref 1.7–2.4)

## 2019-11-04 MED ORDER — PALONOSETRON HCL INJECTION 0.25 MG/5ML
0.2500 mg | Freq: Once | INTRAVENOUS | Status: AC
  Administered 2019-11-04: 0.25 mg via INTRAVENOUS

## 2019-11-04 MED ORDER — SODIUM CHLORIDE 0.9 % IV SOLN
800.0000 mg/m2 | Freq: Once | INTRAVENOUS | Status: AC
  Administered 2019-11-04: 1444 mg via INTRAVENOUS
  Filled 2019-11-04: qty 37.98

## 2019-11-04 MED ORDER — HEPARIN SOD (PORK) LOCK FLUSH 100 UNIT/ML IV SOLN
500.0000 [IU] | Freq: Once | INTRAVENOUS | Status: AC | PRN
  Administered 2019-11-04: 500 [IU]
  Filled 2019-11-04: qty 5

## 2019-11-04 MED ORDER — SODIUM CHLORIDE 0.9 % IV SOLN
Freq: Once | INTRAVENOUS | Status: AC
  Filled 2019-11-04: qty 250

## 2019-11-04 MED ORDER — SODIUM CHLORIDE 0.9 % IV SOLN
INTRAVENOUS | Status: DC
  Filled 2019-11-04: qty 250

## 2019-11-04 MED ORDER — SODIUM CHLORIDE 0.9% FLUSH
10.0000 mL | INTRAVENOUS | Status: DC | PRN
  Administered 2019-11-04: 10 mL
  Filled 2019-11-04: qty 10

## 2019-11-04 MED ORDER — SODIUM CHLORIDE 0.9% FLUSH
10.0000 mL | INTRAVENOUS | Status: DC | PRN
  Administered 2019-11-04: 12:00:00 10 mL
  Filled 2019-11-04: qty 10

## 2019-11-04 MED ORDER — PALONOSETRON HCL INJECTION 0.25 MG/5ML
INTRAVENOUS | Status: AC
  Filled 2019-11-04: qty 5

## 2019-11-04 MED ORDER — SODIUM CHLORIDE 0.9 % IV SOLN
1000.0000 mL | Freq: Once | INTRAVENOUS | Status: DC
  Filled 2019-11-04: qty 1000

## 2019-11-04 NOTE — Patient Instructions (Signed)
[facility] Discharge Instructions for Patients Receiving Chemotherapy  Today you received the following chemotherapy agents:  Gemcitibine (Gemzar), Cisplatin  To help prevent nausea and vomiting after your treatment, we encourage you to take your nausea medication as prescribed.   If you develop nausea and vomiting that is not controlled by your nausea medication, call the clinic.   BELOW ARE SYMPTOMS THAT SHOULD BE REPORTED IMMEDIATELY:  *FEVER GREATER THAN 100.5 F  *CHILLS WITH OR WITHOUT FEVER  NAUSEA AND VOMITING THAT IS NOT CONTROLLED WITH YOUR NAUSEA MEDICATION  *UNUSUAL SHORTNESS OF BREATH  *UNUSUAL BRUISING OR BLEEDING  TENDERNESS IN MOUTH AND THROAT WITH OR WITHOUT PRESENCE OF ULCERS  *URINARY PROBLEMS  *BOWEL PROBLEMS  UNUSUAL RASH Items with * indicate a potential emergency and should be followed up as soon as possible.  Feel free to call the clinic should you have any questions or concerns. The clinic phone number is [phone].  Please show the [place] at check-in to the Emergency Department and triage nurse.

## 2019-11-04 NOTE — Progress Notes (Signed)
Per Dr. Burr Medico, okay to treat today with elevated serum creatinine. Cisplatin will be held today. Patient to receive gemcitabine only today along with fluids.   Demetrius Charity, PharmD, [facility] Pharmacist Pharmacy Phone: [phone] 11/04/2019

## 2019-11-05 ENCOUNTER — Telehealth: Payer: Self-pay | Admitting: Nurse Practitioner

## 2019-11-05 ENCOUNTER — Encounter: Payer: Self-pay | Admitting: Hematology

## 2019-11-05 LAB — CANCER ANTIGEN 19-9: CA 19-9: 39 U/mL — ABNORMAL HIGH (ref 0–35)

## 2019-11-05 NOTE — Telephone Encounter (Signed)
Scheduled appt per 2/4 los.  Left a VM of the appt date and time.

## 2019-11-08 DIAGNOSIS — E7800 Pure hypercholesterolemia, unspecified: Secondary | ICD-10-CM | POA: Diagnosis not present

## 2019-11-08 DIAGNOSIS — I10 Essential (primary) hypertension: Secondary | ICD-10-CM | POA: Diagnosis not present

## 2019-11-08 DIAGNOSIS — E119 Type 2 diabetes mellitus without complications: Secondary | ICD-10-CM | POA: Diagnosis not present

## 2019-11-08 DIAGNOSIS — C221 Intrahepatic bile duct carcinoma: Secondary | ICD-10-CM | POA: Diagnosis not present

## 2019-11-09 ENCOUNTER — Telehealth: Payer: Self-pay

## 2019-11-09 ENCOUNTER — Telehealth: Payer: Self-pay | Admitting: Cardiology

## 2019-11-09 ENCOUNTER — Other Ambulatory Visit: Payer: Self-pay | Admitting: Hematology

## 2019-11-09 NOTE — Telephone Encounter (Signed)
-----   Message from [name], MD sent at 11/08/2019  8:05 PM EST ----- Regarding: RE: Gemzar 11/12/19 Thanks Ginna, I will change his premeds to compazine alone, and sign the order  Santiago Glad, let kim know that he does not need GCSF with this cycle. Please check if his chemo and PET schedule will work on 2/17, and schedule a virtual visit with me on 2/18.   Thanks  Krista Blue ----- Message ----- From: Tora Kindred, [facility] Sent: 11/08/2019   9:12 AM EST To: Alla Feeling, NP, [name], MD Subject: Frederic Jericho 11/12/19                                 Good morning, Mr. Wilbur is coming 11/12/19 for C11D8.   From the LOS on 2/4, it looks like you only plan for Gemzar on 11/12/19.  I have changed the orders to 'unsigned' for the Cisplatin, Emend + Dex & the Cisplatin hydration fluids so that they're not released by RN in error. If he needs these that day, they will need to be signed. If you'd like me to remove them, I can - let me know.  If he is only getting Gemzar, what premed will he need, Compazine? Last week when he got Gemzar alone, he got a dose of Aloxi.  I read a message from pt to Santiago Glad, RN asking about his G-CSF inj.  It looks like it has been removed from Day 9 (given the above plan, makes sense).  Thanks for reviewing - let me know how I can help. Vergia Alcon

## 2019-11-09 NOTE — Telephone Encounter (Signed)
Patient goes to [facility] now.

## 2019-11-09 NOTE — Telephone Encounter (Signed)
I let Christopher Welch know he will not be receiving GCSF this cycle.  He verbalized understanding.

## 2019-11-12 ENCOUNTER — Other Ambulatory Visit: Payer: Self-pay | Admitting: Hematology

## 2019-11-12 ENCOUNTER — Other Ambulatory Visit: Payer: Self-pay

## 2019-11-12 ENCOUNTER — Inpatient Hospital Stay: Payer: Medicare Other

## 2019-11-12 VITALS — BP 139/68 | HR 78 | Temp 98.6°F | Resp 18

## 2019-11-12 DIAGNOSIS — C221 Intrahepatic bile duct carcinoma: Secondary | ICD-10-CM

## 2019-11-12 DIAGNOSIS — Z95828 Presence of other vascular implants and grafts: Secondary | ICD-10-CM

## 2019-11-12 DIAGNOSIS — C782 Secondary malignant neoplasm of pleura: Secondary | ICD-10-CM | POA: Diagnosis not present

## 2019-11-12 DIAGNOSIS — Z5111 Encounter for antineoplastic chemotherapy: Secondary | ICD-10-CM | POA: Diagnosis not present

## 2019-11-12 DIAGNOSIS — B181 Chronic viral hepatitis B without delta-agent: Secondary | ICD-10-CM | POA: Diagnosis not present

## 2019-11-12 DIAGNOSIS — J90 Pleural effusion, not elsewhere classified: Secondary | ICD-10-CM | POA: Diagnosis not present

## 2019-11-12 DIAGNOSIS — E785 Hyperlipidemia, unspecified: Secondary | ICD-10-CM | POA: Diagnosis not present

## 2019-11-12 LAB — CBC WITH DIFFERENTIAL (CANCER CENTER ONLY)
Abs Immature Granulocytes: 0.05 10*3/uL (ref 0.00–0.07)
Basophils Absolute: 0 10*3/uL (ref 0.0–0.1)
Basophils Relative: 0 %
Eosinophils Absolute: 0.1 10*3/uL (ref 0.0–0.5)
Eosinophils Relative: 1 %
HCT: 25.5 % — ABNORMAL LOW (ref 39.0–52.0)
Hemoglobin: 8.5 g/dL — ABNORMAL LOW (ref 13.0–17.0)
Immature Granulocytes: 1 %
Lymphocytes Relative: 22 %
Lymphs Abs: 2 10*3/uL (ref 0.7–4.0)
MCH: 30.9 pg (ref 26.0–34.0)
MCHC: 33.3 g/dL (ref 30.0–36.0)
MCV: 92.7 fL (ref 80.0–100.0)
Monocytes Absolute: 1.6 10*3/uL — ABNORMAL HIGH (ref 0.1–1.0)
Monocytes Relative: 18 %
Neutro Abs: 5.1 10*3/uL (ref 1.7–7.7)
Neutrophils Relative %: 58 %
Platelet Count: 219 10*3/uL (ref 150–400)
RBC: 2.75 MIL/uL — ABNORMAL LOW (ref 4.22–5.81)
RDW: 15 % (ref 11.5–15.5)
WBC Count: 8.8 10*3/uL (ref 4.0–10.5)
nRBC: 0 % (ref 0.0–0.2)

## 2019-11-12 LAB — CMP (CANCER CENTER ONLY)
ALT: 13 U/L (ref 0–44)
AST: 15 U/L (ref 15–41)
Albumin: 3.1 g/dL — ABNORMAL LOW (ref 3.5–5.0)
Alkaline Phosphatase: 53 U/L (ref 38–126)
Anion gap: 10 (ref 5–15)
BUN: 21 mg/dL (ref 8–23)
CO2: 25 mmol/L (ref 22–32)
Calcium: 8.7 mg/dL — ABNORMAL LOW (ref 8.9–10.3)
Chloride: 98 mmol/L (ref 98–111)
Creatinine: 1.19 mg/dL (ref 0.61–1.24)
GFR, Est AFR Am: >60 mL/min (ref >60)
GFR, Estimated: >60 mL/min (ref >60)
Glucose, Bld: 130 mg/dL — ABNORMAL HIGH (ref 70–99)
Potassium: 4.3 mmol/L (ref 3.5–5.1)
Sodium: 133 mmol/L — ABNORMAL LOW (ref 135–145)
Total Bilirubin: 0.2 mg/dL — ABNORMAL LOW (ref 0.3–1.2)
Total Protein: 6.8 g/dL (ref 6.5–8.1)

## 2019-11-12 LAB — MAGNESIUM: Magnesium: 2 mg/dL (ref 1.7–2.4)

## 2019-11-12 MED ORDER — SODIUM CHLORIDE 0.9 % IV SOLN
800.0000 mg/m2 | Freq: Once | INTRAVENOUS | Status: AC
  Administered 2019-11-12: 13:00:00 1444 mg via INTRAVENOUS
  Filled 2019-11-12: qty 37.98

## 2019-11-12 MED ORDER — PROCHLORPERAZINE MALEATE 10 MG PO TABS: 10.0000 mg | ORAL_TABLET | Freq: Once | ORAL | Status: DC

## 2019-11-12 MED ORDER — SODIUM CHLORIDE 0.9 % IV SOLN
INTRAVENOUS | Status: DC
  Filled 2019-11-12: qty 250

## 2019-11-12 MED ORDER — SODIUM CHLORIDE 0.9% FLUSH
10.0000 mL | INTRAVENOUS | Status: DC | PRN
  Administered 2019-11-12: 10 mL
  Filled 2019-11-12: qty 10

## 2019-11-12 MED ORDER — SODIUM CHLORIDE 0.9 % IV SOLN
Freq: Once | INTRAVENOUS | Status: DC
  Filled 2019-11-12: qty 250

## 2019-11-12 MED ORDER — HEPARIN SOD (PORK) LOCK FLUSH 100 UNIT/ML IV SOLN
500.0000 [IU] | Freq: Once | INTRAVENOUS | Status: AC | PRN
  Administered 2019-11-12: 14:00:00 500 [IU]
  Filled 2019-11-12: qty 5

## 2019-11-12 MED ORDER — SODIUM CHLORIDE 0.9% FLUSH
10.0000 mL | INTRAVENOUS | Status: DC | PRN
  Administered 2019-11-12: 14:00:00 10 mL
  Filled 2019-11-12: qty 10

## 2019-11-12 NOTE — Progress Notes (Signed)
NS 500 mL/hr over 2 hours = 1 L total today per Dr. Burr Medico.  Kennith Center, Pharm.D., CPP 11/12/2019@11 :47 AM

## 2019-11-12 NOTE — Patient Instructions (Signed)
[facility] Discharge Instructions for Patients Receiving Chemotherapy  Today you received the following chemotherapy agents:  Gemcitibine (Gemzar)  To help prevent nausea and vomiting after your treatment, we encourage you to take your nausea medication as prescribed.   If you develop nausea and vomiting that is not controlled by your nausea medication, call the clinic.   BELOW ARE SYMPTOMS THAT SHOULD BE REPORTED IMMEDIATELY:  *FEVER GREATER THAN 100.5 F  *CHILLS WITH OR WITHOUT FEVER  NAUSEA AND VOMITING THAT IS NOT CONTROLLED WITH YOUR NAUSEA MEDICATION  *UNUSUAL SHORTNESS OF BREATH  *UNUSUAL BRUISING OR BLEEDING  TENDERNESS IN MOUTH AND THROAT WITH OR WITHOUT PRESENCE OF ULCERS  *URINARY PROBLEMS  *BOWEL PROBLEMS  UNUSUAL RASH Items with * indicate a potential emergency and should be followed up as soon as possible.  Feel free to call the clinic should you have any questions or concerns. The clinic phone number is [phone].  Please show the [place] at check-in to the Emergency Department and triage nurse.

## 2019-11-12 NOTE — Patient Instructions (Signed)

## 2019-11-16 ENCOUNTER — Other Ambulatory Visit: Payer: Self-pay | Admitting: Hematology

## 2019-11-17 ENCOUNTER — Other Ambulatory Visit: Payer: Self-pay

## 2019-11-17 ENCOUNTER — Inpatient Hospital Stay: Payer: Medicare Other

## 2019-11-17 ENCOUNTER — Ambulatory Visit (HOSPITAL_COMMUNITY)
Admission: RE | Admit: 2019-11-17 | Discharge: 2019-11-17 | Disposition: A | Discharge status: Home / Self Care | Payer: Medicare Other | Source: Ambulatory Visit | Attending: Nurse Practitioner | Admitting: Nurse Practitioner

## 2019-11-17 ENCOUNTER — Ambulatory Visit (HOSPITAL_COMMUNITY): Payer: Medicare Other

## 2019-11-17 DIAGNOSIS — J90 Pleural effusion, not elsewhere classified: Secondary | ICD-10-CM | POA: Diagnosis not present

## 2019-11-17 DIAGNOSIS — I2510 Atherosclerotic heart disease of native coronary artery without angina pectoris: Secondary | ICD-10-CM | POA: Insufficient documentation

## 2019-11-17 DIAGNOSIS — E785 Hyperlipidemia, unspecified: Secondary | ICD-10-CM | POA: Diagnosis not present

## 2019-11-17 DIAGNOSIS — C221 Intrahepatic bile duct carcinoma: Secondary | ICD-10-CM | POA: Diagnosis not present

## 2019-11-17 DIAGNOSIS — I700 Atherosclerosis of aorta: Secondary | ICD-10-CM | POA: Diagnosis not present

## 2019-11-17 DIAGNOSIS — B181 Chronic viral hepatitis B without delta-agent: Secondary | ICD-10-CM | POA: Diagnosis not present

## 2019-11-17 DIAGNOSIS — Z5111 Encounter for antineoplastic chemotherapy: Secondary | ICD-10-CM | POA: Diagnosis not present

## 2019-11-17 DIAGNOSIS — Z95828 Presence of other vascular implants and grafts: Secondary | ICD-10-CM

## 2019-11-17 DIAGNOSIS — C782 Secondary malignant neoplasm of pleura: Secondary | ICD-10-CM | POA: Diagnosis not present

## 2019-11-17 LAB — GLUCOSE, CAPILLARY: Glucose-Capillary: 261 mg/dL — ABNORMAL HIGH (ref 70–99)

## 2019-11-17 IMAGING — XA IR CHOLANGIOGRAM VIA EXIST CATHETER
2 series · 6 of 6 positions shown · non-contrast
Comparison: none

INDICATION: 73-year-old male with a history of chronic cholecystitis and
cholangiocarcinoma. He is not an operative candidate. He has an
indwelling percutaneous cholecystostomy tube which frequently
becomes malpositioned. It is currently leaking. He presents for
cholangiogram and potential tube exchange

[Series 2: fl - angio · 4 of 103 frames shown]
[frame 3/103]
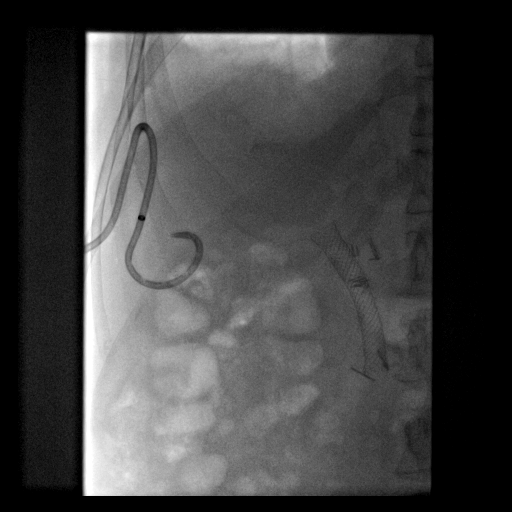
[frame 16/103]
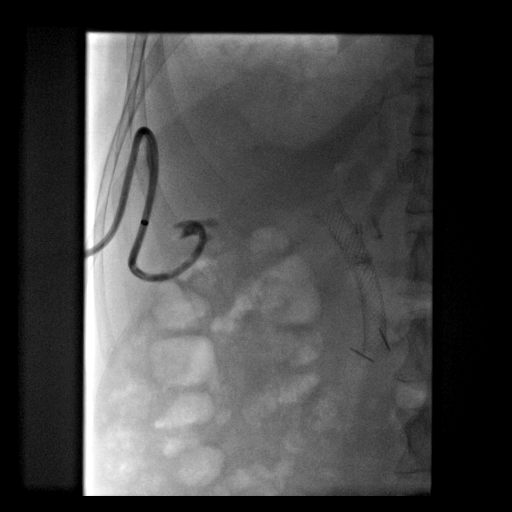
[frame 52/103]
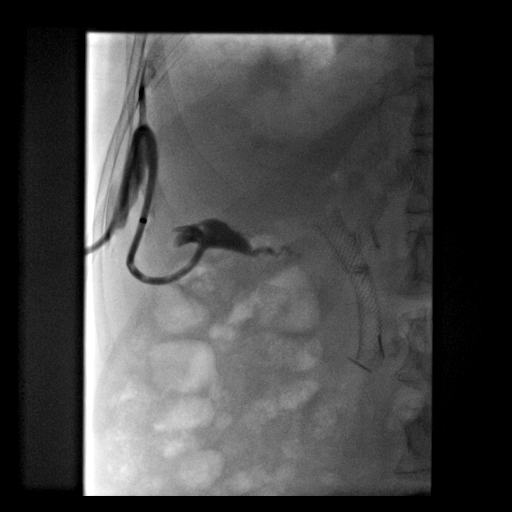
[frame 88/103]
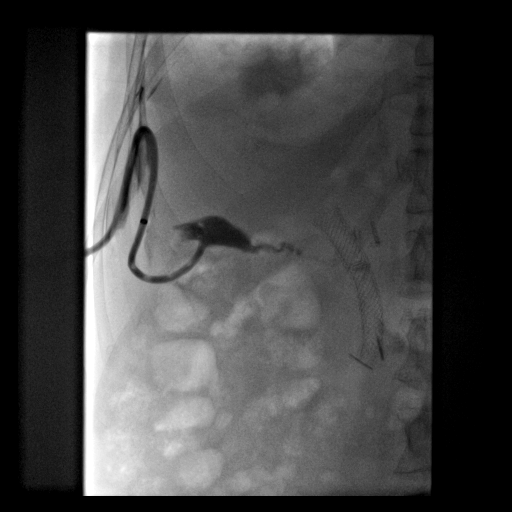

[Series 300: ir exchange biliary drain · 2 of 2 slices shown]
[im 1/2]
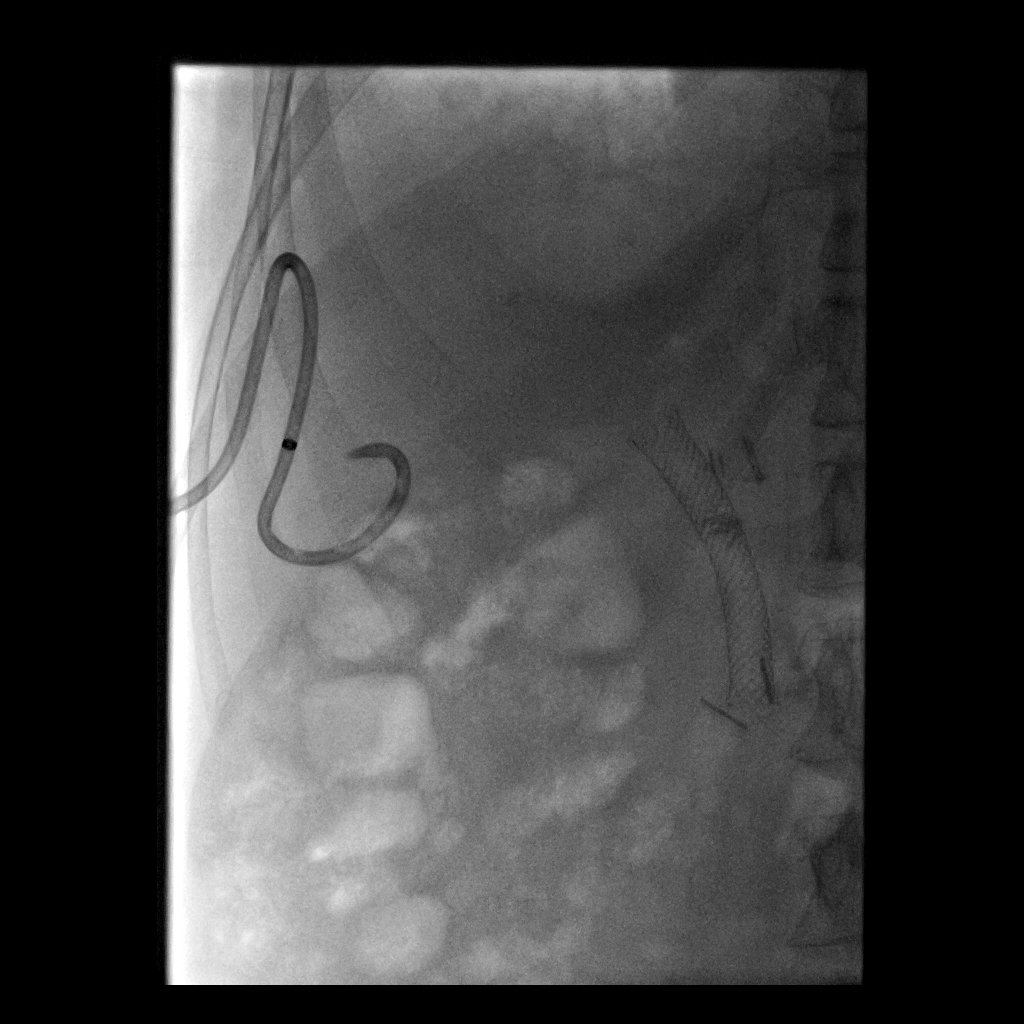
[im 2/2]
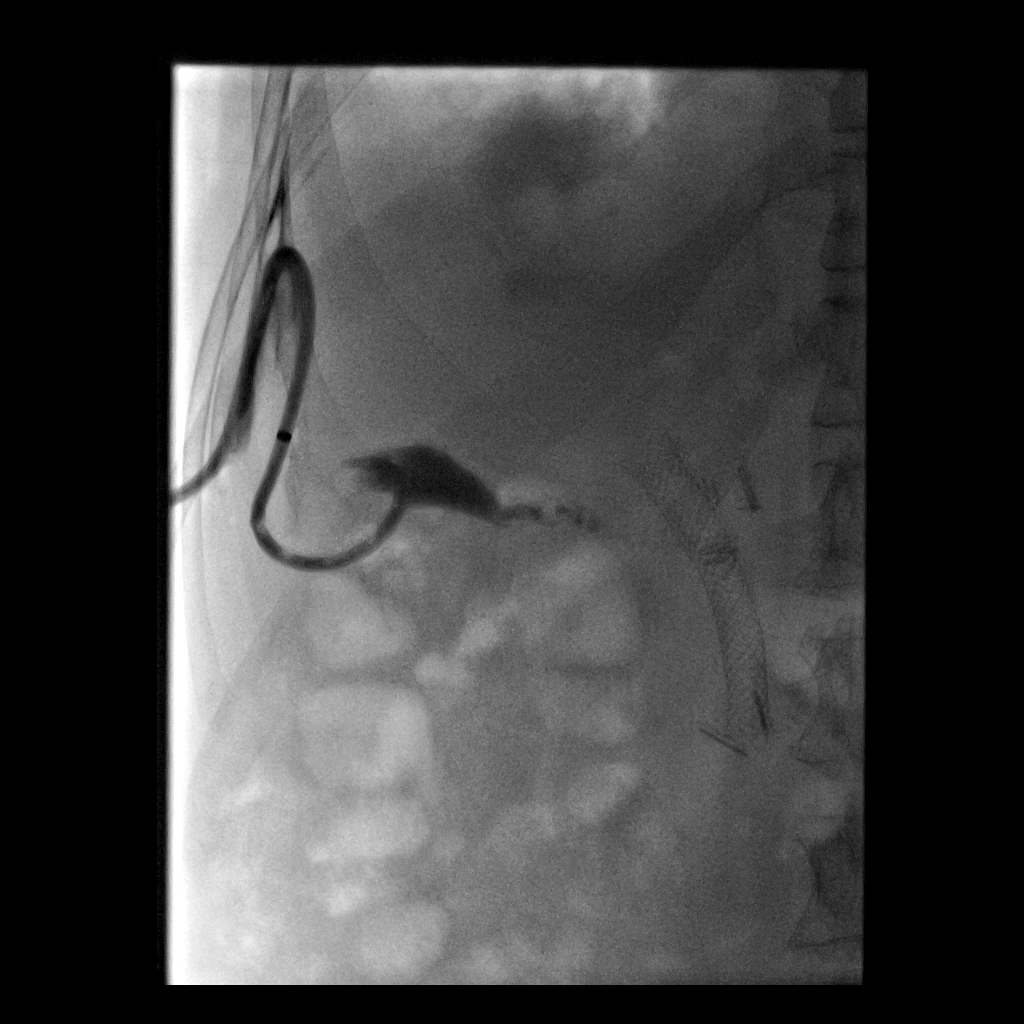

[6 of 6 positions shown; findings below may reference images not displayed]

EXAM:
CHOLANGIOGRAM VIA EXISTING CATHETER; IR EXCHANGE BILARY DRAIN

MEDICATIONS:
None

ANESTHESIA/SEDATION:
None

FLUOROSCOPY TIME:  Fluoroscopy Time: 0 minutes 12 seconds (28 mGy).

COMPLICATIONS:
None immediate.

PROCEDURE:
Informed written consent was obtained from the patient after a
thorough discussion of the procedural risks, benefits and
alternatives. All questions were addressed. Maximal Sterile Barrier
Technique was utilized including caps, mask, sterile gowns, sterile
gloves, sterile drape, hand hygiene and skin antiseptic. A timeout
was performed prior to the initiation of the procedure.

Initial fluoroscopic imaging demonstrates that the cholecystostomy
tube has nearly completely pulled out of the gallbladder. There is
redundant catheter just inside the ribcage heading cephalad toward
the diaphragm before making an acute angle and heading inferior to
the hepatic entry site. A gentle hand injection of contrast material
was performed demonstrating that just the tip of the cholecystostomy
tube remains within the gallbladder lumen.

The decision was made to proceed with tube exchange and attempted
rescue of this nearly displaced catheter.

The existing catheter was cut and a Bentson wire advanced through
the catheter as the catheter was pulled back slightly to reduce the
redundancy. The Bentson wire was successfully navigated into the
gallbladder lumen. However, as the tube was removed over the wire,
it became evident that the retention suture was a tangled with the
tube in the peritoneal space. When the tube was finally successfully
removed, wire access to the gallbladder lumen had been lost due to
the redundancy and manipulation.

An angled 5 French catheter was advanced over the wire and efforts
were made to salvage the tube access. This was unsuccessful.
Additional hand injection of contrast was performed through the 5
French catheter in an effort to identify the path. This was
unsuccessful. The gallbladder is collapsed.

Further attempts at tube exchange were aborted.
IMPRESSION: 1. Cholangiogram demonstrates a collapsed gallbladder and a nearly
completely displaced percutaneous cholecystostomy tube with
significant redundancy of the tube in the peritoneal space between
the liver edge and ribcage.
2. Unsuccessful attempted rescue/exchange of the percutaneous
cholecystostomy tube.

PLAN:
Given that the gallbladder is completely decompressed and the tube
could not be successfully exchanged today, we made the decision to
not place a new tube. We will see how this gentleman does. If he
develops recurrent symptoms of cholecystitis, we will ultimately
have to place a new tube. However, if his cholecystitis has
resolved, he may not require tube placement in the future.

I discussed this with the patient at length. We gave him a card with
a number to call should he develop recurrent symptoms in the future
to facilitate him coming in to have a new cholecystostomy tube
placed.

## 2019-11-17 MED ORDER — FLUDEOXYGLUCOSE F - 18 (FDG) INJECTION
8.2000 | Freq: Once | INTRAVENOUS | Status: AC
  Administered 2019-11-17: 14:00:00 8.2 via INTRAVENOUS

## 2019-11-17 MED ORDER — SODIUM CHLORIDE 0.9% FLUSH
10.0000 mL | INTRAVENOUS | Status: DC | PRN
  Administered 2019-11-17: 13:00:00 10 mL
  Filled 2019-11-17: qty 10

## 2019-11-22 DIAGNOSIS — R918 Other nonspecific abnormal finding of lung field: Secondary | ICD-10-CM | POA: Diagnosis not present

## 2019-11-22 DIAGNOSIS — C250 Malignant neoplasm of head of pancreas: Secondary | ICD-10-CM | POA: Diagnosis not present

## 2019-11-24 NOTE — Progress Notes (Signed)
Christopher Welch   Telephone:(336) [phone] Fax:([phone]) [phone]   Clinic Follow up Note   Patient Care Team: [name], DO as PCP - General (Family Medicine)  Date of Service:  11/26/2019   CHIEF COMPLAINT: F/u of ExtrahepaticCholangiocarcinoma  SUMMARY OF ONCOLOGIC HISTORY: Oncology History  Cholangiocarcinoma ([place])  01/14/2019 Imaging   US Abdomen 01/14/19  IMPRESSION: There is significant intrahepatic and extrahepatic biliary dilatation present, concerning for distal common bile duct obstruction. Further evaluation with CT scan or MRCP is recommended. Correlation with liver function tests is recommended as well. These results will be called to the ordering clinician or representative by the Radiologist Assistant, and communication documented in the PACS or zVision Dashboard.   Probable large amount of sludge seen within gallbladder lumen with mild gallbladder wall thickening. However, no cholelithiasis, pericholecystic fluid or sonographic Murphy's sign is noted.   4.2 cm septated cyst seen in upper pole of right kidney consistent with Bosniak type 2 lesion. Follow-up ultrasound in 1 year is recommended to ensure stability.   01/20/2019 Imaging   MRI abdomen 01/20/19  IMPRESSION: 1. Findings are highly concerning for central tumor in the biliary tract at the confluence of the common hepatic duct, cystic duct and common bile ducts. Further clinical evaluation for potential cholangiocarcinoma is strongly recommended. 2. Mild ductal dilatation of the main pancreatic duct throughout the distal body and tail of the pancreas where there is also some associated side branch ectasia. This may suggest a pancreatic ductal stricture. No obstructing pancreatic neoplasm identified. 3. Aortic atherosclerosis.   01/21/2019 Initial Biopsy   Diagnosis 01/21/19  BILE DUCT BRUSHING (SPECIMEN 1 OF 1 COLLECTED 01/21/2019) ADENOCARCINOMA.   01/21/2019 Procedure   ERCP By Dr hung  01/21/19 IMRPESSION - The major papilla appeared normal. - A single localized biliary stricture was found in the middle third of the main bile duct. - The entire main bile duct and upper third of the main bile duct were dilated, secondary to a stricture. - A biliary sphincterotomy was performed. - Cells for cytology obtained in the middle third of the main bile duct. - One plastic stent was placed into the common bile duct.  EUS by Dr hung 01/21/19  IMPRESSION - There was dilation in the middle third of the main bile duct and in the upper third of the main bile duct which measured up to 15 mm. - There was a suggestion of a stricture in the middle third of the main bile duct. - No specimens collected.   02/03/2019 Imaging   CT CAP at [facility] 02/03/19  Impression:  1. There is mild intrahepatic and extrahepatic biliary ductal dilation and diffuse common bile duct wall thickening with stent in place. There is abnormal soft tissue measuring approximately 1.6 cm between the common hepatic artery, portal vein and common bile duct, worrisome for the known cholangiocarcinoma.  2. Periportal lymphadenopathy which may represent nodal metastasis.  4. Marked pancreatic atrophy and duct dilation involving the distal body and tail the pancreas where there is a coarse calcification. These findings are favored to represent stenosis from intraductal stones though an underlying stricture cannot be completely excluded.  5. Atypical symmetric prominent fat stranding of the bilateral lower abdominal wall. Correlate with surgical history or history of history of trauma.   03/08/2019 Initial Diagnosis   Cholangiocarcinoma ([place])   03/12/2019 -  Chemotherapy   gemcitabine and cisplatin on days 1 and 8 every 21 days starting 03/12/19. Abraxane added with cycle 2. Added GCSF [name])  to day 9 starting cycle 2.    03/19/2019 Cancer Staging   Staging form: Perihilar Bile Ducts, AJCC 8th Edition - Clinical:  Stage Unknown (cTX, cN0, cM0) - Signed by [name], MD on 03/19/2019   05/24/2019 Imaging   CT CAP W Contrast at [facility]  1.  Interval placement of covered metallic biliary stent. No progressive dilation of the biliary tree and resulting resolution of the prior main pancreatic duct dilation. 2.  Evidence of some subtle vascular contour deformity involving the hepatic artery, suspected to be from tumoral contact and/or posttreatment changes. 3.  New small right pleural effusion with overlying mild airspace disease. Secondary findings which may indicate a component of aspiration. 4.  No convincing evidence of new disseminated metastatic disease. 5.  Additional findings as discussed above.   06/18/2019 Imaging   CT AP IMPRESSION: 1.  No acute findings in the abdomen/pelvis. 2. Biliary stent in adequate position. No evidence of focal mass or adenopathy in the region of the pancreatic head or porta hepatis. 3. Moderate size right pleural effusion with associated right basilar atelectasis. 4. Possible single gallstone. Stable subcentimeter liver cyst. Stable right renal cysts. Small left inguinal hernia containing only peritoneal fat. 5. Aortic Atherosclerosis (ICD10-I70.0).   07/28/2019 Imaging   CT CAP IMPRESSION: 1. The patient is status post common bile duct stent placement, with unchanged stent position and patent appearance with pneumobilia. Primary cholangiocarcinoma is not discretely appreciated by CT. 2. No direct evidence of metastatic disease in the chest, abdomen, or pelvis. 3. Moderate right pleural effusion with associated atelectasis or consolidation, slightly improved compared to prior examination. 4. The pancreatic parenchyma is atrophic and calcified, particularly in the pancreatic tail. 5.  Coronary artery disease.  Aortic atherosclerosis   10/20/2019 PET scan   IMPRESSION: 1. Redemonstrated post treatment and post stenting appearance of a central cholangiocarcinoma,  which is not directly appreciated by CT. Common bile duct stent remains in position with post stenting pneumobilia. 2. Slight interval increase in a moderate to large right pleural effusion with associated atelectasis or consolidation. There may be some pleural nodularity about the right hemidiaphragm which appears new compared to prior examination (series 2, image 40), highly suspicious for pleural metastatic disease. 3. No direct evidence of metastatic disease in the chest, abdomen, or pelvis. 4. Coronary artery disease.  Aortic Atherosclerosis (ICD10-I70.0).     10/29/2019 Pathology Results   Thoracentesis FINAL MICROSCOPIC DIAGNOSIS:  - Reactive mesothelial cells present    11/17/2019 PET scan   IMPRESSION: 1. Low-level hypermetabolism within the right pleural space, without focal abnormality to strongly suggest pleural metastasis. 2. Hypermetabolism along the course of the common duct stent, nonspecific and most likely reactive. 3. Otherwise, no evidence of hypermetabolic metastasis. 4. Small to moderate right pleural effusion with developing loculation superiorly. 5. Coronary artery atherosclerosis. Aortic Atherosclerosis (ICD10-I70.0). 6.  Possible constipation.        CURRENT THERAPY:  Gemcitabine and cisplatin on days 1 and 8 every 21 daysstarting 03/12/19. Abraxane added with cycle 2.AddedGCSF ([name])to day 9 startingcycle 2.Chemo held since 06/03/19 due to poor toleration. Chemo restarted with Gem/Cis with C6 on 07/02/19.  INTERVAL HISTORY:  Christopher Welch is here for a follow up. He presents to the clinic alone. He denies any pain. He notes his constipation has currently resolved. He notes he spoke with Dr. Carlis Abbott on 2/22. They reviewed his scan and notes surgery is not recommended given his performance status. He overall is still managing chemo well. He notes  in the last few days he has been able to eat more. He is open to appetite stimulant. He only mild  improvement with  Gemcitabine alone. He notes he is not taking anything to help him sleep. He is trying to manage this himself. He notes only having occasional severe mid abdominal pain once a week.   He plans to get his first Watson vaccination on 2/28.     REVIEW OF SYSTEMS:   Constitutional: Denies fevers, chills or abnormal weight loss Eyes: Denies blurriness of vision Ears, nose, mouth, throat, and face: Denies mucositis or sore throat Respiratory: Denies cough, dyspnea or wheezes Cardiovascular: Denies palpitation, chest discomfort or lower extremity swelling Gastrointestinal:  Denies nausea, heartburn or change in bowel habits (+) occasional mid abdominal pain  Skin: Denies abnormal skin rashes Lymphatics: Denies new lymphadenopathy or easy bruising Neurological:Denies numbness, tingling or new weaknesses Behavioral/Psych: Mood is stable, no new changes  All other systems were reviewed with the patient and are negative.  MEDICAL HISTORY:  Past Medical History:  Diagnosis Date  . Diabetes mellitus type I, controlled ([place])   . Dyslipidemia   . ED (erectile dysfunction)   . Essential hypertension   . Gilbert's disease   . Hepatitis B carrier [place])     SURGICAL HISTORY: Past Surgical History:  Procedure Laterality Date  . APPENDECTOMY  1964  . BILIARY BRUSHING  01/21/2019   Procedure: BILIARY BRUSHING;  Surgeon: Carol Ada, MD;  Location: [facility];  Service: Endoscopy;;  . BILIARY STENT PLACEMENT N/A 01/21/2019   Procedure: BILIARY STENT PLACEMENT;  Surgeon: Carol Ada, MD;  Location: [facility];  Service: Endoscopy;  Laterality: N/A;  . ENDOSCOPIC RETROGRADE CHOLANGIOPANCREATOGRAPHY (ERCP) WITH PROPOFOL N/A 01/21/2019   Procedure: ENDOSCOPIC RETROGRADE CHOLANGIOPANCREATOGRAPHY (ERCP) WITH PROPOFOL;  Surgeon: Carol Ada, MD;  Location: [facility];  Service: Endoscopy;  Laterality: N/A;  . ESOPHAGOGASTRODUODENOSCOPY (EGD) WITH PROPOFOL N/A 01/21/2019    Procedure: ESOPHAGOGASTRODUODENOSCOPY (EGD) WITH PROPOFOL;  Surgeon: Carol Ada, MD;  Location: [facility];  Service: Endoscopy;  Laterality: N/A;  . FEMORAL HERNIA REPAIR    . IR CHOLANGIOGRAM EXISTING TUBE  05/13/2019  . IR CHOLANGIOGRAM EXISTING TUBE  06/08/2019  . IR EXCHANGE BILIARY DRAIN  05/18/2019  . IR EXCHANGE BILIARY DRAIN  06/08/2019  . IR IMAGING GUIDED PORT INSERTION  03/08/2019  . IR PERC CHOLECYSTOSTOMY  05/04/2019  . IR THORACENTESIS ASP PLEURAL SPACE W/IMG GUIDE  10/29/2019  . LEFT HEART CATH AND CORONARY ANGIOGRAPHY N/A 05/19/2017   Procedure: LEFT HEART CATH AND CORONARY ANGIOGRAPHY;  Surgeon: Leonie Man, MD;  Location: [facility];  Service: Cardiovascular;  Laterality: N/A;  . SPHINCTEROTOMY  01/21/2019   Procedure: SPHINCTEROTOMY;  Surgeon: Carol Ada, MD;  Location: [facility];  Service: Endoscopy;;  . UPPER ESOPHAGEAL ENDOSCOPIC ULTRASOUND (EUS) N/A 01/21/2019   Procedure: UPPER ESOPHAGEAL ENDOSCOPIC ULTRASOUND (EUS);  Surgeon: Carol Ada, MD;  Location: [facility];  Service: Endoscopy;  Laterality: N/A;    I have reviewed the social history and family history with the patient and they are unchanged from previous note.  ALLERGIES:  is allergic to erythromycin.  MEDICATIONS:  Current Outpatient Medications  Medication Sig Dispense Refill  . acetaminophen (TYLENOL) 500 MG tablet Take 1,000 mg by mouth 2 (two) times daily as needed for moderate pain or headache.    Marland Kitchen amLODipine (NORVASC) 5 MG tablet Take 5 mg by mouth daily.     Marland Kitchen atenolol (TENORMIN) 25 MG tablet Take 25 mg by mouth daily. Once  a day     . atorvastatin (LIPITOR) 10 MG tablet Take 10 mg by mouth daily.    . Blood Glucose Monitoring Suppl (ACCU-CHEK NANO SMARTVIEW) W/DEVICE KIT 1 kit by Does not apply route 2 (two) times daily. (Patient taking differently: 1 kit by Does not apply route See admin instructions. Test blood sugars 12x's daily) 1 kit 0  . clonazePAM (KLONOPIN) 0.5 MG tablet      . glucose blood test strip Test 3 times a day. (Patient taking differently: 1 each by Other route See admin instructions. Test 12 times a day.) 300 each Prn  . insulin NPH Human (NOVOLIN N) 100 UNIT/ML injection Inject 20 Units into the skin at bedtime.     . insulin regular (NOVOLIN R) 100 units/mL injection Inject 20 Units into the skin 3 (three) times daily before meals.     . lidocaine-prilocaine (EMLA) cream Apply to affected area once 30 g 3  . lisinopril (ZESTRIL) 20 MG tablet Take 20 mg by mouth daily.     Marland Kitchen LORazepam (ATIVAN) 0.5 MG tablet Take 0.5-1 tablets (0.25-0.5 mg total) by mouth every 8 (eight) hours as needed (nausea and vomiting). 30 tablet 0  . magnesium oxide (MAG-OX) 400 MG tablet TAKE 1 TABLET BY MOUTH EVERY DAY 90 tablet 1  . metFORMIN (GLUCOPHAGE) 1000 MG tablet Take 2,000 mg by mouth every evening.     . ondansetron (ZOFRAN) 8 MG tablet TAKE 1 TABLET BY MOUTH 2 TIMES DAY AS NEEDED. START ON 3RD DAY AFTER CHEMOTHERAPY 30 tablet 1  . oxycodone (OXY-IR) 5 MG capsule Take 1 capsule (5 mg total) by mouth every 6 (six) hours as needed for pain (severe pain). 20 capsule 0  . prochlorperazine (COMPAZINE) 10 MG tablet TAKE 1 TABLET (10 MG TOTAL) BY MOUTH EVERY 6 (SIX) HOURS AS NEEDED (NAUSEA OR VOMITING). 30 tablet 1  . zolpidem (AMBIEN) 5 MG tablet Take 1-2 tablets (5-10 mg total) by mouth at bedtime as needed for sleep. 60 tablet 0  . mirtazapine (REMERON) 7.5 MG tablet Take 1 tablet (7.5 mg total) by mouth at bedtime. 30 tablet 0   No current facility-administered medications for this visit.    PHYSICAL EXAMINATION: ECOG PERFORMANCE STATUS: 2 - Symptomatic, <50% confined to bed  Vitals:   11/26/19 0840  BP: 139/77  Pulse: 74  Resp: 16  Temp: 98.2 F (36.8 C)  SpO2: 100%   Filed Weights   11/26/19 0840  Weight: 143 lb (64.9 kg)   Due to COVID19 we will limit examination to appearance. Patient had no complaints.  GENERAL:alert, no distress and comfortable SKIN:  skin color normal, no rashes or significant lesions EYES: normal, Conjunctiva are pink and non-injected, sclera clear  NEURO: alert & oriented x 3 with fluent speech   LABORATORY DATA:  I have reviewed the data as listed CBC Latest Ref Rng & Units 11/26/2019 11/12/2019 11/04/2019  WBC 4.0 - 10.5 K/uL 7.8 8.8 8.9  Hemoglobin 13.0 - 17.0 g/dL 8.6(L) 8.5(L) 9.2(L)  Hematocrit 39.0 - 52.0 % 25.7(L) 25.5(L) 27.7(L)  Platelets 150 - 400 K/uL 403(H) 219 408(H)     CMP Latest Ref Rng & Units 11/26/2019 11/12/2019 11/04/2019  Glucose 70 - 99 mg/dL 127(H) 130(H) 219(H)  BUN 8 - 23 mg/dL 17 21 27(H)  Creatinine 0.61 - 1.24 mg/dL 1.25(H) 1.19 1.78(H)  Sodium 135 - 145 mmol/L 138 133(L) 133(L)  Potassium 3.5 - 5.1 mmol/L 4.3 4.3 4.8  Chloride 98 - 111 mmol/L 101 98  98  CO2 22 - 32 mmol/L 25 25 27   Calcium 8.9 - 10.3 mg/dL 9.1 8.7(L) 9.1  Total Protein 6.5 - 8.1 g/dL 7.1 6.8 6.8  Total Bilirubin 0.3 - 1.2 mg/dL 0.4 0.2(L) 0.3  Alkaline Phos 38 - 126 U/L 69 53 53  AST 15 - 41 U/L 19 15 18   ALT 0 - 44 U/L 20 13 17     Thoracentesis 10/29/19 IMPRESSION: Successful ultrasound guided right thoracentesis yielding 1.3 liters of pleural fluid. FINAL MICROSCOPIC DIAGNOSIS:  - Reactive mesothelial cells present    RADIOGRAPHIC STUDIES: I have personally reviewed the radiological images as listed and agreed with the findings in the report. No results found.   ASSESSMENT & PLAN:  Christopher Welch is a 74 y.o. male with    1. Extrahepatic cholangiocarcinoma, cTxN0M0 -He wasdiagnosedin 12/2018. His work up revealed malignant stricture and CBD obstruction required stenting. Brushing revealed adenocarcinoma. His extrahepatic cholangiocarcinoma was not resectable due to vascular invasion(surgeon - Dr. Zenia Resides at [facility] started first line chemogemcitabine and cisplatin on days 1 and 8 every 21 dayson6/12/20. Abraxane added with cycle 2.AddedGCSF ([name])to day 9 startingcycle 2.Abraxane stopped  after C4 and chemo held9/3/20-10/2/10 due to poor toleration/hospitalization. He has improved with dose reduction. Cisplatin held starting with C12 due to decreased kidney function.  -Given negative cytology from 10/29/19 Thoracentesis he proceeded with PET scan. We reviewed and discussed his PET from 11/17/19 which shows right pleural effusion. No evidence of hypermetabolic metastasis, although plural metastasis is not ruled out.  -He met with surgeon Dr. Carlis Abbott on 2/22 who does not recommend surgery at this time given his decreased performance status. Will continue with same chemotherapy regimen to control his disease.   -His Cr has much improved last week, will resume cisplatin this cycle  -Labs reviewed, and adequate to proceed with C12D1 Gemcitabine and Cisplatin. Proceed with Day 8 next week  -F/u in 3 weeks with next cycle -He will proceed with first COVID19 vaccine on 2/28.    2.History of acute cholecystitis -He washospitalizedon 05/03/19.  -He had a percutaneous gallbladder drainage tube placed by IR8/5/20.It was removed on 9/8 due to leakage  -Hiscurrent abdominal pain is probably related to the procedure, I encouraged him to use pain medication as needed -No clinical high concern for acute cholecystitis we will continue to monitoring clinically  3.Right-sided pleural effusion -He underwent right thoracentesis with 2.1 is removedon 06/23/2019, cytologynegative.The etiology is unclear, certainly malignancy is ahigh possibility. He was treated with course of antibiotics -His SOB has progressed lately and he has worsened right pleural effusion on 10/20/19 CT scan.  -He had a Thoracentesis on 10/29/19 with 1.3L removed. Cytology showed reactive mesothelial cells, no malignancy. PET from 11/17/19 is not definitve for metastasis but still suspected, will monitor.   4. Weight lossand low appetite -secondary to malignancyand chemo, he presented with 20 lbs weight loss  -Continue  to f/u with dietician -His appetitehas dropped significantly with recent cholecystitisand secondary to chemo due to nausea.  -I encouragedhim tocontinuenutritional supplement (carnation breakfast and Boost)to 3-5 bottles aday. -With chemo dose reduction (no Abraxane)he has been able to maintain weight. -To help stimulate his low appetite and help his sleep I recommend Mirtazapine 7.86m. He is willing to try (11/26/19)  5. DM -not well controlled -on insulin,managed by PCP Dr. CTheda Sers-Due to elevated BG secondary to chemo pre-meds, he has been needing to increase his insulin but working on controlling it.  6. CAD, HL, HTN -on amlodipine, atenolol,  lisinopril, and statin. Continue tofollow up withcardiologist Dr. Ellyn Hack -controlled and stable  7. Mid abdominal pain  -Occurs about once a week -I encouraged him to make sure he has BM and avoid constipation with stool softeners.  -I also encouraged him to use antiacids to help with bloating.  -Will monitor for progression   PLAN: -I called in Mirtazapine today  -PET scan reviewed, stable disease  -Labs reviewed and adequate to proceed with C12D1 Gemcitabine and Cisplatin today  -Lab, flush, f/u and chemo in 1, 3, 4 weeks  -F/u in 3 weeks    No problem-specific Assessment & Plan notes found for this encounter.   No orders of the defined types were placed in this encounter.  All questions were answered. The patient knows to call the clinic with any problems, questions or concerns. No barriers to learning was detected. The total time spent in the appointment was 30 minutes.     [name], MD 11/26/2019   I, Joslyn Devon, am acting as scribe for [name], MD.   I have reviewed the above documentation for accuracy and completeness, and I agree with the above.

## 2019-11-26 ENCOUNTER — Inpatient Hospital Stay: Payer: Medicare Other

## 2019-11-26 ENCOUNTER — Other Ambulatory Visit: Payer: Self-pay

## 2019-11-26 ENCOUNTER — Encounter: Payer: Self-pay | Admitting: Hematology

## 2019-11-26 ENCOUNTER — Inpatient Hospital Stay (HOSPITAL_BASED_OUTPATIENT_CLINIC_OR_DEPARTMENT_OTHER): Payer: Medicare Other | Admitting: Hematology

## 2019-11-26 VITALS — BP 139/77 | HR 74 | Temp 98.2°F | Resp 16 | Ht 67.0 in | Wt 143.0 lb

## 2019-11-26 DIAGNOSIS — C221 Intrahepatic bile duct carcinoma: Secondary | ICD-10-CM

## 2019-11-26 DIAGNOSIS — J90 Pleural effusion, not elsewhere classified: Secondary | ICD-10-CM | POA: Diagnosis not present

## 2019-11-26 DIAGNOSIS — Z95828 Presence of other vascular implants and grafts: Secondary | ICD-10-CM

## 2019-11-26 DIAGNOSIS — E785 Hyperlipidemia, unspecified: Secondary | ICD-10-CM | POA: Diagnosis not present

## 2019-11-26 DIAGNOSIS — B181 Chronic viral hepatitis B without delta-agent: Secondary | ICD-10-CM | POA: Diagnosis not present

## 2019-11-26 DIAGNOSIS — Z5111 Encounter for antineoplastic chemotherapy: Secondary | ICD-10-CM | POA: Diagnosis not present

## 2019-11-26 DIAGNOSIS — C782 Secondary malignant neoplasm of pleura: Secondary | ICD-10-CM | POA: Diagnosis not present

## 2019-11-26 LAB — CMP (CANCER CENTER ONLY)
ALT: 20 U/L (ref 0–44)
AST: 19 U/L (ref 15–41)
Albumin: 3.2 g/dL — ABNORMAL LOW (ref 3.5–5.0)
Alkaline Phosphatase: 69 U/L (ref 38–126)
Anion gap: 12 (ref 5–15)
BUN: 17 mg/dL (ref 8–23)
CO2: 25 mmol/L (ref 22–32)
Calcium: 9.1 mg/dL (ref 8.9–10.3)
Chloride: 101 mmol/L (ref 98–111)
Creatinine: 1.25 mg/dL — ABNORMAL HIGH (ref 0.61–1.24)
GFR, Est AFR Am: >60 mL/min (ref >60)
GFR, Estimated: 57 mL/min — ABNORMAL LOW (ref >60)
Glucose, Bld: 127 mg/dL — ABNORMAL HIGH (ref 70–99)
Potassium: 4.3 mmol/L (ref 3.5–5.1)
Sodium: 138 mmol/L (ref 135–145)
Total Bilirubin: 0.4 mg/dL (ref 0.3–1.2)
Total Protein: 7.1 g/dL (ref 6.5–8.1)

## 2019-11-26 LAB — CBC WITH DIFFERENTIAL (CANCER CENTER ONLY)
Abs Immature Granulocytes: 0.02 10*3/uL (ref 0.00–0.07)
Basophils Absolute: 0 10*3/uL (ref 0.0–0.1)
Basophils Relative: 0 %
Eosinophils Absolute: 0.1 10*3/uL (ref 0.0–0.5)
Eosinophils Relative: 1 %
HCT: 25.7 % — ABNORMAL LOW (ref 39.0–52.0)
Hemoglobin: 8.6 g/dL — ABNORMAL LOW (ref 13.0–17.0)
Immature Granulocytes: 0 %
Lymphocytes Relative: 27 %
Lymphs Abs: 2.1 10*3/uL (ref 0.7–4.0)
MCH: 30.5 pg (ref 26.0–34.0)
MCHC: 33.5 g/dL (ref 30.0–36.0)
MCV: 91.1 fL (ref 80.0–100.0)
Monocytes Absolute: 1.3 10*3/uL — ABNORMAL HIGH (ref 0.1–1.0)
Monocytes Relative: 16 %
Neutro Abs: 4.3 10*3/uL (ref 1.7–7.7)
Neutrophils Relative %: 56 %
Platelet Count: 403 10*3/uL — ABNORMAL HIGH (ref 150–400)
RBC: 2.82 MIL/uL — ABNORMAL LOW (ref 4.22–5.81)
RDW: 14.6 % (ref 11.5–15.5)
WBC Count: 7.8 10*3/uL (ref 4.0–10.5)
nRBC: 0 % (ref 0.0–0.2)

## 2019-11-26 LAB — MAGNESIUM: Magnesium: 2 mg/dL (ref 1.7–2.4)

## 2019-11-26 MED ORDER — PALONOSETRON HCL INJECTION 0.25 MG/5ML
0.2500 mg | Freq: Once | INTRAVENOUS | Status: AC
  Administered 2019-11-26: 0.25 mg via INTRAVENOUS

## 2019-11-26 MED ORDER — SODIUM CHLORIDE 0.9 % IV SOLN
Freq: Once | INTRAVENOUS | Status: AC
  Filled 2019-11-26: qty 5

## 2019-11-26 MED ORDER — HEPARIN SOD (PORK) LOCK FLUSH 100 UNIT/ML IV SOLN
500.0000 [IU] | Freq: Once | INTRAVENOUS | Status: AC | PRN
  Administered 2019-11-26: 500 [IU]
  Filled 2019-11-26: qty 5

## 2019-11-26 MED ORDER — SODIUM CHLORIDE 0.9 % IV SOLN
Freq: Once | INTRAVENOUS | Status: AC
  Filled 2019-11-26: qty 250

## 2019-11-26 MED ORDER — PALONOSETRON HCL INJECTION 0.25 MG/5ML
INTRAVENOUS | Status: AC
  Filled 2019-11-26: qty 5

## 2019-11-26 MED ORDER — HEPARIN SOD (PORK) LOCK FLUSH 100 UNIT/ML IV SOLN
500.0000 [IU] | Freq: Once | INTRAVENOUS | Status: DC | PRN
  Filled 2019-11-26: qty 5

## 2019-11-26 MED ORDER — SODIUM CHLORIDE 0.9% FLUSH
10.0000 mL | INTRAVENOUS | Status: DC | PRN
  Filled 2019-11-26: qty 10

## 2019-11-26 MED ORDER — SODIUM CHLORIDE 0.9 % IV SOLN
800.0000 mg/m2 | Freq: Once | INTRAVENOUS | Status: AC
  Administered 2019-11-26: 1444 mg via INTRAVENOUS
  Filled 2019-11-26: qty 37.98

## 2019-11-26 MED ORDER — SODIUM CHLORIDE 0.9% FLUSH
10.0000 mL | INTRAVENOUS | Status: DC | PRN
  Administered 2019-11-26: 10 mL
  Filled 2019-11-26: qty 10

## 2019-11-26 MED ORDER — SODIUM CHLORIDE 0.9 % IV SOLN
Freq: Once | INTRAVENOUS | Status: AC
  Filled 2019-11-26: qty 1000

## 2019-11-26 MED ORDER — SODIUM CHLORIDE 0.9 % IV SOLN
25.0000 mg/m2 | Freq: Once | INTRAVENOUS | Status: AC
  Administered 2019-11-26: 45 mg via INTRAVENOUS
  Filled 2019-11-26: qty 45

## 2019-11-26 MED ORDER — MIRTAZAPINE 7.5 MG PO TABS: 7.5000 mg | ORAL_TABLET | Freq: Every day | ORAL | 0 refills | Status: DC

## 2019-11-26 MED ORDER — SODIUM CHLORIDE 0.9 % IV SOLN
Freq: Once | INTRAVENOUS | Status: DC
  Filled 2019-11-26: qty 250

## 2019-11-26 NOTE — Patient Instructions (Signed)

## 2019-11-26 NOTE — Patient Instructions (Signed)
[facility] Discharge Instructions for Patients Receiving Chemotherapy  Today you received the following chemotherapy agents:  Gemcitibine (Gemzar), Cisplatin  To help prevent nausea and vomiting after your treatment, we encourage you to take your nausea medication as prescribed.   If you develop nausea and vomiting that is not controlled by your nausea medication, call the clinic.   BELOW ARE SYMPTOMS THAT SHOULD BE REPORTED IMMEDIATELY:  *FEVER GREATER THAN 100.5 F  *CHILLS WITH OR WITHOUT FEVER  NAUSEA AND VOMITING THAT IS NOT CONTROLLED WITH YOUR NAUSEA MEDICATION  *UNUSUAL SHORTNESS OF BREATH  *UNUSUAL BRUISING OR BLEEDING  TENDERNESS IN MOUTH AND THROAT WITH OR WITHOUT PRESENCE OF ULCERS  *URINARY PROBLEMS  *BOWEL PROBLEMS  UNUSUAL RASH Items with * indicate a potential emergency and should be followed up as soon as possible.  Feel free to call the clinic should you have any questions or concerns. The clinic phone number is [phone].  Please show the [facility] at check-in to the Emergency Department and triage nurse.

## 2019-11-27 LAB — CANCER ANTIGEN 19-9: CA 19-9: 41 U/mL — ABNORMAL HIGH (ref 0–35)

## 2019-11-28 ENCOUNTER — Ambulatory Visit: Payer: Medicare Other | Attending: Internal Medicine

## 2019-11-28 DIAGNOSIS — Z23 Encounter for immunization: Secondary | ICD-10-CM

## 2019-11-28 NOTE — Progress Notes (Signed)
   Covid-19 Vaccination Clinic  Name:  Christopher Welch    MRN: [id number] DOB: October 21, 1945  11/28/2019  [name] was observed post Covid-19 immunization for 15 minutes without incidence. He was provided with Vaccine Information Sheet and instruction to access the V-Safe system.   [name] was instructed to call 911 with any severe reactions post vaccine: Marland Kitchen Difficulty breathing  . Swelling of your face and throat  . A fast heartbeat  . A bad rash all over your body  . Dizziness and weakness    Immunizations Administered    Name Date Dose VIS Date Route   Pfizer COVID-19 Vaccine 11/28/2019 12:04 PM 0.3 mL 09/10/2019 Intramuscular   Manufacturer: [employer]   Lot: [id number]   [place]: [id number]

## 2019-11-29 ENCOUNTER — Telehealth: Payer: Self-pay | Admitting: Hematology

## 2019-11-29 ENCOUNTER — Encounter: Payer: Self-pay | Admitting: Hematology

## 2019-11-29 NOTE — Telephone Encounter (Signed)
Scheduled appt per 2/26 los.  Patient will get an updated appt calendar at his next scheduled appt.

## 2019-12-02 ENCOUNTER — Other Ambulatory Visit: Payer: Self-pay

## 2019-12-02 ENCOUNTER — Encounter: Payer: Self-pay | Admitting: Hematology

## 2019-12-02 ENCOUNTER — Inpatient Hospital Stay: Payer: Medicare Other

## 2019-12-02 ENCOUNTER — Inpatient Hospital Stay: Payer: Medicare Other | Attending: Hematology

## 2019-12-02 VITALS — BP 133/74 | HR 73 | Temp 98.0°F | Resp 16

## 2019-12-02 DIAGNOSIS — Z5189 Encounter for other specified aftercare: Secondary | ICD-10-CM | POA: Insufficient documentation

## 2019-12-02 DIAGNOSIS — G4700 Insomnia, unspecified: Secondary | ICD-10-CM | POA: Insufficient documentation

## 2019-12-02 DIAGNOSIS — Z79899 Other long term (current) drug therapy: Secondary | ICD-10-CM | POA: Insufficient documentation

## 2019-12-02 DIAGNOSIS — I2510 Atherosclerotic heart disease of native coronary artery without angina pectoris: Secondary | ICD-10-CM | POA: Diagnosis not present

## 2019-12-02 DIAGNOSIS — Z794 Long term (current) use of insulin: Secondary | ICD-10-CM | POA: Insufficient documentation

## 2019-12-02 DIAGNOSIS — J90 Pleural effusion, not elsewhere classified: Secondary | ICD-10-CM | POA: Diagnosis not present

## 2019-12-02 DIAGNOSIS — C782 Secondary malignant neoplasm of pleura: Secondary | ICD-10-CM | POA: Insufficient documentation

## 2019-12-02 DIAGNOSIS — C221 Intrahepatic bile duct carcinoma: Secondary | ICD-10-CM

## 2019-12-02 DIAGNOSIS — E1040 Type 1 diabetes mellitus with diabetic neuropathy, unspecified: Secondary | ICD-10-CM | POA: Insufficient documentation

## 2019-12-02 DIAGNOSIS — E785 Hyperlipidemia, unspecified: Secondary | ICD-10-CM | POA: Diagnosis not present

## 2019-12-02 DIAGNOSIS — B181 Chronic viral hepatitis B without delta-agent: Secondary | ICD-10-CM | POA: Insufficient documentation

## 2019-12-02 DIAGNOSIS — R0609 Other forms of dyspnea: Secondary | ICD-10-CM | POA: Diagnosis not present

## 2019-12-02 DIAGNOSIS — K5900 Constipation, unspecified: Secondary | ICD-10-CM | POA: Insufficient documentation

## 2019-12-02 DIAGNOSIS — I10 Essential (primary) hypertension: Secondary | ICD-10-CM | POA: Insufficient documentation

## 2019-12-02 DIAGNOSIS — Z5111 Encounter for antineoplastic chemotherapy: Secondary | ICD-10-CM | POA: Insufficient documentation

## 2019-12-02 DIAGNOSIS — Z452 Encounter for adjustment and management of vascular access device: Secondary | ICD-10-CM | POA: Diagnosis present

## 2019-12-02 LAB — CMP (CANCER CENTER ONLY)
ALT: 15 U/L (ref 0–44)
AST: 16 U/L (ref 15–41)
Albumin: 3.2 g/dL — ABNORMAL LOW (ref 3.5–5.0)
Alkaline Phosphatase: 63 U/L (ref 38–126)
Anion gap: 8 (ref 5–15)
BUN: 17 mg/dL (ref 8–23)
CO2: 26 mmol/L (ref 22–32)
Calcium: 8.9 mg/dL (ref 8.9–10.3)
Chloride: 99 mmol/L (ref 98–111)
Creatinine: 1.21 mg/dL (ref 0.61–1.24)
GFR, Est AFR Am: >60 mL/min (ref >60)
GFR, Estimated: 59 mL/min — ABNORMAL LOW (ref >60)
Glucose, Bld: 106 mg/dL — ABNORMAL HIGH (ref 70–99)
Potassium: 4.2 mmol/L (ref 3.5–5.1)
Sodium: 133 mmol/L — ABNORMAL LOW (ref 135–145)
Total Bilirubin: 0.4 mg/dL (ref 0.3–1.2)
Total Protein: 7.1 g/dL (ref 6.5–8.1)

## 2019-12-02 LAB — CBC WITH DIFFERENTIAL (CANCER CENTER ONLY)
Abs Immature Granulocytes: 0.02 10*3/uL (ref 0.00–0.07)
Basophils Absolute: 0 10*3/uL (ref 0.0–0.1)
Basophils Relative: 1 %
Eosinophils Absolute: 0 10*3/uL (ref 0.0–0.5)
Eosinophils Relative: 1 %
HCT: 27 % — ABNORMAL LOW (ref 39.0–52.0)
Hemoglobin: 8.9 g/dL — ABNORMAL LOW (ref 13.0–17.0)
Immature Granulocytes: 0 %
Lymphocytes Relative: 38 %
Lymphs Abs: 1.8 10*3/uL (ref 0.7–4.0)
MCH: 30.4 pg (ref 26.0–34.0)
MCHC: 33 g/dL (ref 30.0–36.0)
MCV: 92.2 fL (ref 80.0–100.0)
Monocytes Absolute: 0.4 10*3/uL (ref 0.1–1.0)
Monocytes Relative: 8 %
Neutro Abs: 2.4 10*3/uL (ref 1.7–7.7)
Neutrophils Relative %: 52 %
Platelet Count: 319 10*3/uL (ref 150–400)
RBC: 2.93 MIL/uL — ABNORMAL LOW (ref 4.22–5.81)
RDW: 14.4 % (ref 11.5–15.5)
WBC Count: 4.7 10*3/uL (ref 4.0–10.5)
nRBC: 0 % (ref 0.0–0.2)

## 2019-12-02 LAB — MAGNESIUM: Magnesium: 1.9 mg/dL (ref 1.7–2.4)

## 2019-12-02 MED ORDER — SODIUM CHLORIDE 0.9 % IV SOLN
25.0000 mg/m2 | Freq: Once | INTRAVENOUS | Status: AC
  Administered 2019-12-02: 13:00:00 45 mg via INTRAVENOUS
  Filled 2019-12-02: qty 45

## 2019-12-02 MED ORDER — SODIUM CHLORIDE 0.9 % IV SOLN
800.0000 mg/m2 | Freq: Once | INTRAVENOUS | Status: AC
  Administered 2019-12-02: 1444 mg via INTRAVENOUS
  Filled 2019-12-02: qty 37.98

## 2019-12-02 MED ORDER — PALONOSETRON HCL INJECTION 0.25 MG/5ML
0.2500 mg | Freq: Once | INTRAVENOUS | Status: AC
  Administered 2019-12-02: 0.25 mg via INTRAVENOUS

## 2019-12-02 MED ORDER — SODIUM CHLORIDE 0.9% FLUSH
10.0000 mL | INTRAVENOUS | Status: DC | PRN
  Administered 2019-12-02: 10 mL
  Filled 2019-12-02: qty 10

## 2019-12-02 MED ORDER — SODIUM CHLORIDE 0.9 % IV SOLN
Freq: Once | INTRAVENOUS | Status: AC
  Filled 2019-12-02: qty 5

## 2019-12-02 MED ORDER — HEPARIN SOD (PORK) LOCK FLUSH 100 UNIT/ML IV SOLN
500.0000 [IU] | Freq: Once | INTRAVENOUS | Status: AC | PRN
  Administered 2019-12-02: 16:00:00 500 [IU]
  Filled 2019-12-02: qty 5

## 2019-12-02 MED ORDER — SODIUM CHLORIDE 0.9 % IV SOLN
Freq: Once | INTRAVENOUS | Status: AC
  Filled 2019-12-02: qty 1000

## 2019-12-02 MED ORDER — PALONOSETRON HCL INJECTION 0.25 MG/5ML
INTRAVENOUS | Status: AC
  Filled 2019-12-02: qty 5

## 2019-12-02 MED ORDER — SODIUM CHLORIDE 0.9 % IV SOLN
Freq: Once | INTRAVENOUS | Status: AC
  Filled 2019-12-02: qty 250

## 2019-12-02 NOTE — Patient Instructions (Signed)
[facility] Discharge Instructions for Patients Receiving Chemotherapy  Today you received the following chemotherapy agents: gemcitabine and cisplatin.  To help prevent nausea and vomiting after your treatment, we encourage you to take your nausea medication as directed.   If you develop nausea and vomiting that is not controlled by your nausea medication, call the clinic.   BELOW ARE SYMPTOMS THAT SHOULD BE REPORTED IMMEDIATELY:  *FEVER GREATER THAN 100.5 F  *CHILLS WITH OR WITHOUT FEVER  NAUSEA AND VOMITING THAT IS NOT CONTROLLED WITH YOUR NAUSEA MEDICATION  *UNUSUAL SHORTNESS OF BREATH  *UNUSUAL BRUISING OR BLEEDING  TENDERNESS IN MOUTH AND THROAT WITH OR WITHOUT PRESENCE OF ULCERS  *URINARY PROBLEMS  *BOWEL PROBLEMS  UNUSUAL RASH Items with * indicate a potential emergency and should be followed up as soon as possible.  Feel free to call the clinic should you have any questions or concerns. The clinic phone number is [phone].  Please show the CHEMO ALERT CARD at check-in to the Emergency Department and triage nurse.   

## 2019-12-04 ENCOUNTER — Inpatient Hospital Stay: Payer: Medicare Other

## 2019-12-04 ENCOUNTER — Other Ambulatory Visit: Payer: Self-pay

## 2019-12-04 VITALS — BP 148/82 | HR 82 | Temp 97.8°F | Resp 17

## 2019-12-04 DIAGNOSIS — K5900 Constipation, unspecified: Secondary | ICD-10-CM | POA: Diagnosis not present

## 2019-12-04 DIAGNOSIS — J90 Pleural effusion, not elsewhere classified: Secondary | ICD-10-CM | POA: Diagnosis not present

## 2019-12-04 DIAGNOSIS — I2510 Atherosclerotic heart disease of native coronary artery without angina pectoris: Secondary | ICD-10-CM | POA: Diagnosis not present

## 2019-12-04 DIAGNOSIS — C221 Intrahepatic bile duct carcinoma: Secondary | ICD-10-CM | POA: Diagnosis not present

## 2019-12-04 DIAGNOSIS — R0609 Other forms of dyspnea: Secondary | ICD-10-CM | POA: Diagnosis not present

## 2019-12-04 DIAGNOSIS — C782 Secondary malignant neoplasm of pleura: Secondary | ICD-10-CM | POA: Diagnosis not present

## 2019-12-04 MED ORDER — PEGFILGRASTIM-CBQV 6 MG/0.6ML ~~LOC~~ SOSY
6.0000 mg | PREFILLED_SYRINGE | Freq: Once | SUBCUTANEOUS | Status: AC
  Administered 2019-12-04: 6 mg via SUBCUTANEOUS

## 2019-12-04 NOTE — Patient Instructions (Signed)

## 2019-12-09 ENCOUNTER — Other Ambulatory Visit: Payer: Medicare Other

## 2019-12-09 ENCOUNTER — Ambulatory Visit: Payer: Medicare Other

## 2019-12-09 ENCOUNTER — Ambulatory Visit: Payer: Medicare Other | Admitting: Nurse Practitioner

## 2019-12-15 NOTE — Progress Notes (Signed)
[place]   [phone] [phone]   Clinic Follow up Note   Patient Care Team: [name], DO as PCP - General (Family Medicine) 12/16/2019  CHIEF COMPLAINT: F/u cholangiocarcinoma   SUMMARY OF ONCOLOGIC HISTORY: Oncology History  Cholangiocarcinoma ([place])  01/14/2019 Imaging   US Abdomen 01/14/19  IMPRESSION: There is significant intrahepatic and extrahepatic biliary dilatation present, concerning for distal common bile duct obstruction. Further evaluation with CT scan or MRCP is recommended. Correlation with liver function tests is recommended as well. These results will be called to the ordering clinician or representative by the Radiologist Assistant, and communication documented in the PACS or zVision Dashboard.   Probable large amount of sludge seen within gallbladder lumen with mild gallbladder wall thickening. However, no cholelithiasis, pericholecystic fluid or sonographic Murphy's sign is noted.   4.2 cm septated cyst seen in upper pole of right kidney consistent with Bosniak type 2 lesion. Follow-up ultrasound in 1 year is recommended to ensure stability.   01/20/2019 Imaging   MRI abdomen 01/20/19  IMPRESSION: 1. Findings are highly concerning for central tumor in the biliary tract at the confluence of the common hepatic duct, cystic duct and common bile ducts. Further clinical evaluation for potential cholangiocarcinoma is strongly recommended. 2. Mild ductal dilatation of the main pancreatic duct throughout the distal body and tail of the pancreas where there is also some associated side branch ectasia. This may suggest a pancreatic ductal stricture. No obstructing pancreatic neoplasm identified. 3. Aortic atherosclerosis.   01/21/2019 Initial Biopsy   Diagnosis 01/21/19  BILE DUCT BRUSHING (SPECIMEN 1 OF 1 COLLECTED 01/21/2019) ADENOCARCINOMA.   01/21/2019 Procedure   ERCP By Dr hung 01/21/19 IMRPESSION - The major  papilla appeared normal. - A single localized biliary stricture was found in the middle third of the main bile duct. - The entire main bile duct and upper third of the main bile duct were dilated, secondary to a stricture. - A biliary sphincterotomy was performed. - Cells for cytology obtained in the middle third of the main bile duct. - One plastic stent was placed into the common bile duct.  EUS by Dr hung 01/21/19  IMPRESSION - There was dilation in the middle third of the main bile duct and in the upper third of the main bile duct which measured up to 15 mm. - There was a suggestion of a stricture in the middle third of the main bile duct. - No specimens collected.   02/03/2019 Imaging   CT CAP at [facility] 02/03/19  Impression:  1. There is mild intrahepatic and extrahepatic biliary ductal dilation and diffuse common bile duct wall thickening with stent in place. There is abnormal soft tissue measuring approximately 1.6 cm between the common hepatic artery, portal vein and common bile duct, worrisome for the known cholangiocarcinoma.  2. Periportal lymphadenopathy which may represent nodal metastasis.  4. Marked pancreatic atrophy and duct dilation involving the distal body and tail the pancreas where there is a coarse calcification. These findings are favored to represent stenosis from intraductal stones though an underlying stricture cannot be completely excluded.  5. Atypical symmetric prominent fat stranding of the bilateral lower abdominal wall. Correlate with surgical history or history of history of trauma.   03/08/2019 Initial Diagnosis   Cholangiocarcinoma ([place])   03/12/2019 -  Chemotherapy   gemcitabine and cisplatin on days 1 and 8 every 21 days starting 03/12/19. Abraxane added with cycle 2. Added GCSF [name]) to day 9 starting cycle 2.  03/19/2019 Cancer Staging   Staging form: Perihilar Bile Ducts, AJCC 8th Edition - Clinical: Stage Unknown (cTX, cN0, cM0) -  Signed by [name], MD on 03/19/2019   05/24/2019 Imaging   CT CAP W Contrast at [facility]  1.  Interval placement of covered metallic biliary stent. No progressive dilation of the biliary tree and resulting resolution of the prior main pancreatic duct dilation. 2.  Evidence of some subtle vascular contour deformity involving the hepatic artery, suspected to be from tumoral contact and/or posttreatment changes. 3.  New small right pleural effusion with overlying mild airspace disease. Secondary findings which may indicate a component of aspiration. 4.  No convincing evidence of new disseminated metastatic disease. 5.  Additional findings as discussed above.   06/18/2019 Imaging   CT AP IMPRESSION: 1.  No acute findings in the abdomen/pelvis. 2. Biliary stent in adequate position. No evidence of focal mass or adenopathy in the region of the pancreatic head or porta hepatis. 3. Moderate size right pleural effusion with associated right basilar atelectasis. 4. Possible single gallstone. Stable subcentimeter liver cyst. Stable right renal cysts. Small left inguinal hernia containing only peritoneal fat. 5. Aortic Atherosclerosis (ICD10-I70.0).   07/28/2019 Imaging   CT CAP IMPRESSION: 1. The patient is status post common bile duct stent placement, with unchanged stent position and patent appearance with pneumobilia. Primary cholangiocarcinoma is not discretely appreciated by CT. 2. No direct evidence of metastatic disease in the chest, abdomen, or pelvis. 3. Moderate right pleural effusion with associated atelectasis or consolidation, slightly improved compared to prior examination. 4. The pancreatic parenchyma is atrophic and calcified, particularly in the pancreatic tail. 5.  Coronary artery disease.  Aortic atherosclerosis   10/20/2019 PET scan   IMPRESSION: 1. Redemonstrated post treatment and post stenting appearance of a central cholangiocarcinoma, which is not directly appreciated by  CT. Common bile duct stent remains in position with post stenting pneumobilia. 2. Slight interval increase in a moderate to large right pleural effusion with associated atelectasis or consolidation. There may be some pleural nodularity about the right hemidiaphragm which appears new compared to prior examination (series 2, image 40), highly suspicious for pleural metastatic disease. 3. No direct evidence of metastatic disease in the chest, abdomen, or pelvis. 4. Coronary artery disease.  Aortic Atherosclerosis (ICD10-I70.0).     10/29/2019 Pathology Results   Thoracentesis FINAL MICROSCOPIC DIAGNOSIS:  - Reactive mesothelial cells present    11/17/2019 PET scan   IMPRESSION: 1. Low-level hypermetabolism within the right pleural space, without focal abnormality to strongly suggest pleural metastasis. 2. Hypermetabolism along the course of the common duct stent, nonspecific and most likely reactive. 3. Otherwise, no evidence of hypermetabolic metastasis. 4. Small to moderate right pleural effusion with developing loculation superiorly. 5. Coronary artery atherosclerosis. Aortic Atherosclerosis (ICD10-I70.0). 6.  Possible constipation.       CURRENT THERAPY:  Gemcitabine and cisplatin on days 1 and 8 every 21 daysstarting 03/12/19. Abraxane added with cycle 2.AddedGCSF ([name])to day 9 startingcycle 2.Chemo held since 06/03/19 due to poor toleration. Chemo restarted with Gem/Cis with C6 on 07/02/19.  INTERVAL HISTORY: Mr. Mccormac returns for f/u and treatment as scheduled. He completed cycle 12 gemcitabine and cisplatin on 12/02/19 with [name] on 3/6. He feels well today, energy and appetite improve with each day after chemo. Last 3 days have been really good. Mirtazapine has helped appetite. Not much nausea, resolves with eating. Mild constipation is managed. Has mild dyspnea on exertion which is stable for him. No significant cough,  chest pain, fever, chills. He does not think  pleural fluid is reaccumulating. Neuropathy is stable lately, overall better from when he was diagnosed with diabetic neuropathy. Hearing is a little worse but he manages well. Has rare mid abdominal gripping pain that occurs once every couple of weeks, not new or worse, and does not require medication. He talks briefly about how he is satisfied with his life he believes has been well lived.    MEDICAL HISTORY:  Past Medical History:  Diagnosis Date  . Diabetes mellitus type I, controlled ([place])   . Dyslipidemia   . ED (erectile dysfunction)   . Essential hypertension   . Gilbert's disease   . Hepatitis B carrier [facility])     SURGICAL HISTORY: Past Surgical History:  Procedure Laterality Date  . APPENDECTOMY  1964  . BILIARY BRUSHING  01/21/2019   Procedure: BILIARY BRUSHING;  Surgeon: Carol Ada, MD;  Location: [facility] ENDOSCOPY;  Service: Endoscopy;;  . BILIARY STENT PLACEMENT N/A 01/21/2019   Procedure: BILIARY STENT PLACEMENT;  Surgeon: Carol Ada, MD;  Location: [facility] ENDOSCOPY;  Service: Endoscopy;  Laterality: N/A;  . ENDOSCOPIC RETROGRADE CHOLANGIOPANCREATOGRAPHY (ERCP) WITH PROPOFOL N/A 01/21/2019   Procedure: ENDOSCOPIC RETROGRADE CHOLANGIOPANCREATOGRAPHY (ERCP) WITH PROPOFOL;  Surgeon: Carol Ada, MD;  Location: [facility];  Service: Endoscopy;  Laterality: N/A;  . ESOPHAGOGASTRODUODENOSCOPY (EGD) WITH PROPOFOL N/A 01/21/2019   Procedure: ESOPHAGOGASTRODUODENOSCOPY (EGD) WITH PROPOFOL;  Surgeon: Carol Ada, MD;  Location: [facility];  Service: Endoscopy;  Laterality: N/A;  . FEMORAL HERNIA REPAIR    . IR CHOLANGIOGRAM EXISTING TUBE  05/13/2019  . IR CHOLANGIOGRAM EXISTING TUBE  06/08/2019  . IR EXCHANGE BILIARY DRAIN  05/18/2019  . IR EXCHANGE BILIARY DRAIN  06/08/2019  . IR IMAGING GUIDED PORT INSERTION  03/08/2019  . IR PERC CHOLECYSTOSTOMY  05/04/2019  . IR THORACENTESIS ASP PLEURAL SPACE W/IMG GUIDE  10/29/2019  . LEFT HEART CATH AND CORONARY ANGIOGRAPHY N/A 05/19/2017    Procedure: LEFT HEART CATH AND CORONARY ANGIOGRAPHY;  Surgeon: Leonie Man, MD;  Location: [facility];  Service: Cardiovascular;  Laterality: N/A;  . SPHINCTEROTOMY  01/21/2019   Procedure: SPHINCTEROTOMY;  Surgeon: Carol Ada, MD;  Location: [facility];  Service: Endoscopy;;  . UPPER ESOPHAGEAL ENDOSCOPIC ULTRASOUND (EUS) N/A 01/21/2019   Procedure: UPPER ESOPHAGEAL ENDOSCOPIC ULTRASOUND (EUS);  Surgeon: Carol Ada, MD;  Location: [facility];  Service: Endoscopy;  Laterality: N/A;    I have reviewed the social history and family history with the patient and they are unchanged from previous note.  ALLERGIES:  is allergic to erythromycin.  MEDICATIONS:  Current Outpatient Medications  Medication Sig Dispense Refill  . acetaminophen (TYLENOL) 500 MG tablet Take 1,000 mg by mouth 2 (two) times daily as needed for moderate pain or headache.    Marland Kitchen amLODipine (NORVASC) 5 MG tablet Take 5 mg by mouth daily.     Marland Kitchen atenolol (TENORMIN) 25 MG tablet Take 25 mg by mouth daily. Once a day     . atorvastatin (LIPITOR) 10 MG tablet Take 10 mg by mouth daily.    . Blood Glucose Monitoring Suppl (ACCU-CHEK NANO SMARTVIEW) W/DEVICE KIT 1 kit by Does not apply route 2 (two) times daily. (Patient taking differently: 1 kit by Does not apply route See admin instructions. Test blood sugars 12x's daily) 1 kit 0  . clonazePAM (KLONOPIN) 0.5 MG tablet     . glucose blood test strip Test 3 times a day. (Patient taking differently: 1 each by Other route See  admin instructions. Test 12 times a day.) 300 each Prn  . insulin NPH Human (NOVOLIN N) 100 UNIT/ML injection Inject 20 Units into the skin at bedtime.     . insulin regular (NOVOLIN R) 100 units/mL injection Inject 20 Units into the skin 3 (three) times daily before meals.     . lidocaine-prilocaine (EMLA) cream Apply to affected area once 30 g 3  . lisinopril (ZESTRIL) 20 MG tablet Take 20 mg by mouth daily.     Marland Kitchen LORazepam (ATIVAN) 0.5 MG  tablet Take 0.5-1 tablets (0.25-0.5 mg total) by mouth every 8 (eight) hours as needed (nausea and vomiting). 30 tablet 0  . magnesium oxide (MAG-OX) 400 MG tablet TAKE 1 TABLET BY MOUTH EVERY DAY 90 tablet 1  . metFORMIN (GLUCOPHAGE) 1000 MG tablet Take 2,000 mg by mouth every evening.     . ondansetron (ZOFRAN) 8 MG tablet TAKE 1 TABLET BY MOUTH 2 TIMES DAY AS NEEDED. START ON 3RD DAY AFTER CHEMOTHERAPY 30 tablet 1  . oxycodone (OXY-IR) 5 MG capsule Take 1 capsule (5 mg total) by mouth every 6 (six) hours as needed for pain (severe pain). 20 capsule 0  . prochlorperazine (COMPAZINE) 10 MG tablet TAKE 1 TABLET (10 MG TOTAL) BY MOUTH EVERY 6 (SIX) HOURS AS NEEDED (NAUSEA OR VOMITING). 30 tablet 1  . zolpidem (AMBIEN) 5 MG tablet Take 1-2 tablets (5-10 mg total) by mouth at bedtime as needed for sleep. 60 tablet 0  . mirtazapine (REMERON) 7.5 MG tablet Take 1 tablet (7.5 mg total) by mouth at bedtime. 30 tablet 0   No current facility-administered medications for this visit.   Facility-Administered Medications Ordered in Other Visits  Medication Dose Route Frequency Provider Last Rate Last Admin  . gemcitabine (GEMZAR) 1,444 mg in sodium chloride 0.9 % 250 mL chemo infusion  800 mg/m2 (Treatment Plan Recorded) Intravenous Once [name], MD      . heparin lock flush 100 unit/mL  500 Units Intracatheter Once PRN [name], MD      . sodium chloride flush (NS) 0.9 % injection 10 mL  10 mL Intracatheter PRN [name], MD        PHYSICAL EXAMINATION: ECOG PERFORMANCE STATUS: 1 - Symptomatic but completely ambulatory  Vitals:   12/16/19 0829  BP: (!) 147/70  Pulse: 70  Resp: 18  Temp: 97.8 F (36.6 C)  SpO2: 98%   Filed Weights   12/16/19 0829  Weight: 145 lb 1.6 oz (65.8 kg)    GENERAL:alert, no distress and comfortable SKIN: no rash  EYES: sclera clear LUNGS: clear with normal breathing effort HEART: regular rate & rhythm NEURO: alert & oriented x 3 with fluent speech,normal  gait PAC without erythema   LABORATORY DATA:  I have reviewed the data as listed CBC Latest Ref Rng & Units 12/16/2019 12/02/2019 11/26/2019  WBC 4.0 - 10.5 K/uL 6.4 4.7 7.8  Hemoglobin 13.0 - 17.0 g/dL 8.1(L) 8.9(L) 8.6(L)  Hematocrit 39.0 - 52.0 % 24.8(L) 27.0(L) 25.7(L)  Platelets 150 - 400 K/uL 240 319 403(H)     CMP Latest Ref Rng & Units 12/16/2019 12/02/2019 11/26/2019  Glucose 70 - 99 mg/dL 148(H) 106(H) 127(H)  BUN 8 - 23 mg/dL 17 17 17   Creatinine 0.61 - 1.24 mg/dL 1.25(H) 1.21 1.25(H)  Sodium 135 - 145 mmol/L 135 133(L) 138  Potassium 3.5 - 5.1 mmol/L 4.3 4.2 4.3  Chloride 98 - 111 mmol/L 101 99 101  CO2 22 - 32 mmol/L 25 26 25  Calcium 8.9 - 10.3 mg/dL 9.0 8.9 9.1  Total Protein 6.5 - 8.1 g/dL 6.9 7.1 7.1  Total Bilirubin 0.3 - 1.2 mg/dL 0.3 0.4 0.4  Alkaline Phos 38 - 126 U/L 61 63 69  AST 15 - 41 U/L 16 16 19   ALT 0 - 44 U/L 9 15 20       RADIOGRAPHIC STUDIES: I have personally reviewed the radiological images as listed and agreed with the findings in the report. No results found.   ASSESSMENT & PLAN: Christopher Welch a 74y.o.malewith   1. Extrahepatic cholangiocarcinoma, cTxN0M0 -He wasdiagnosedin 12/2018; presented withmalignant stricture and CBD obstruction required stenting. Brushing revealed adenocarcinoma. He underwent attempted whipple per Dr. Zenia Resides at [facility] which was aborted due to vascular invasion -now followed by Dr. Carlis Abbott at [facility] -He was referred to our clinic to initiate chemotherapy in the neoadjuvant setting. -He proceeded with neoadjuvantgemcitabine and cisplatin on days 1 and 8 every 21 daysstarting 03/12/19. Abraxane added with cycle 2.AddedGCSF ([name])to day 9 startingcycle 2. -Hetolerated first few cycles very well without significant side effects;tumor marker CA 19.9dropped significantly as he started chemo, indicating good response to chemo treatment -his performance status decreasedandabraxane was held with cycle 4  -remains  able to tolerate gem/cis on days 1 and 8 q21 days well overall with intermittent fatigue and nausea but able to recover well  -restaging CT CAP on 10/20/19 showed stable disease in the liver but with increased right pleural effusion and nodularity along the pleura, concerncing for pleural metastasis.  -He underwent diagnostic and therapeutic thoracentesis on 10/29/19, path showed reactive mesothelial cells  -PET from 11/17/19 shows right pleural effusion. No evidence of hypermetabolic metastasis, although plural mets not ruled out.  -Per Dr. Carlis Abbott on 2/22 he is not a surgical candidate due to decreased PS.  -He continue cisplatin and gemcitabine 2 weeks on and 1 week off with [name]   2.CINV, constipation -nausea worsened secondary to chemotherapy and recent constipation episode in 08/2019  -low dose ativan was added for increased n/v if zofran and compazine are adequate -will try to avoid po decadron due to DM -mild lately, well controlled   3. H/o Acute cholecystitis -Hospitalizedon 05/03/19; s/ppercutaneous gallbladder drainage tube placed by IR8/5/20. -completed antibiotics -tube exchanged 05/18/19 due to leakagewhich has resolved, his abdominal pain improved until it began leaking again on 9/8. IR could not exchange the tube as the gallbladder was completely decompressed and it was removed -[place] on 06/18/19 showed no evidence of cholecystitis -no clinical evidence of recurrent cholecystitis today  4. Weight lossand low appetite -secondary to malignancyand chemo;he presented with 20 lbs weight loss  -Continue to f/u with dietician -His appetitehas dropped significantly when he hadcholecystitis.  -continue nutrition supplements -Recently started Mirtazapine which is helping   5. DM -managed by PCP Dr. Theda Sers -he has had DM for 20 years, he is compliant with regimen and knows how to adjust BG -on insulin, he knows how to adjust dose based on his BG level -BG <200 lately    6. CAD, HL, HTN -followed by cardiologist Dr. Ellyn Hack -on amlodipine, atenolol, lisinopril, and statin -BP stable 147/70 today  7. Social support -he is single, no children, he lives alone.He has a good friend whochecks on him -followed by SW  8.Insomnia -He is able to fall asleep but has trouble staying asleep -tried Z-quil and ambien but stopped due to lack of efficacy -Recently started mirtazapine which is helping    Disposition:  Mr. Alessio appears stable. He  completed cycle 12 cisplatin and gemcitabine. He continues to tolerate treatment well except fatigue and mild constipation and nausea. He is able to recover well. Labs reviewed. CBC shows Hgb 8.1, he is not symptomatic. His mild DOE is likely from pleural effusion. CMP stable, Cr better lately. CA 19-9 is pending from today, has been stable lately.   He will proceed with cycle 13 day 1 cisplatin and gemcitabine today, no dosage adjustments. He will return for lab and possible RBC transfusion  3/22 or 3/23, then for day 8 chemo on 3/25 and [name] on day 9. F/u in 3 weeks with next cycle.    Orders Placed This Encounter  Procedures  . Sample to Blood Bank    Standing Status:   Future    Standing Expiration Date:   12/15/2020   All questions were answered. The patient knows to call the clinic with any problems, questions or concerns. No barriers to learning was detected.     Alla Feeling, NP 12/16/19

## 2019-12-16 ENCOUNTER — Other Ambulatory Visit: Payer: Self-pay

## 2019-12-16 ENCOUNTER — Inpatient Hospital Stay: Payer: Medicare Other

## 2019-12-16 ENCOUNTER — Encounter: Payer: Self-pay | Admitting: Nurse Practitioner

## 2019-12-16 ENCOUNTER — Inpatient Hospital Stay (HOSPITAL_BASED_OUTPATIENT_CLINIC_OR_DEPARTMENT_OTHER): Payer: Medicare Other | Admitting: Nurse Practitioner

## 2019-12-16 VITALS — BP 147/70 | HR 70 | Temp 97.8°F | Resp 18 | Ht 67.0 in | Wt 145.1 lb

## 2019-12-16 DIAGNOSIS — C221 Intrahepatic bile duct carcinoma: Secondary | ICD-10-CM

## 2019-12-16 DIAGNOSIS — J90 Pleural effusion, not elsewhere classified: Secondary | ICD-10-CM | POA: Diagnosis not present

## 2019-12-16 DIAGNOSIS — I2510 Atherosclerotic heart disease of native coronary artery without angina pectoris: Secondary | ICD-10-CM | POA: Diagnosis not present

## 2019-12-16 DIAGNOSIS — C782 Secondary malignant neoplasm of pleura: Secondary | ICD-10-CM | POA: Diagnosis not present

## 2019-12-16 DIAGNOSIS — K5900 Constipation, unspecified: Secondary | ICD-10-CM | POA: Diagnosis not present

## 2019-12-16 DIAGNOSIS — R0609 Other forms of dyspnea: Secondary | ICD-10-CM | POA: Diagnosis not present

## 2019-12-16 DIAGNOSIS — Z95828 Presence of other vascular implants and grafts: Secondary | ICD-10-CM

## 2019-12-16 LAB — CMP (CANCER CENTER ONLY)
ALT: 9 U/L (ref 0–44)
AST: 16 U/L (ref 15–41)
Albumin: 3.2 g/dL — ABNORMAL LOW (ref 3.5–5.0)
Alkaline Phosphatase: 61 U/L (ref 38–126)
Anion gap: 9 (ref 5–15)
BUN: 17 mg/dL (ref 8–23)
CO2: 25 mmol/L (ref 22–32)
Calcium: 9 mg/dL (ref 8.9–10.3)
Chloride: 101 mmol/L (ref 98–111)
Creatinine: 1.25 mg/dL — ABNORMAL HIGH (ref 0.61–1.24)
GFR, Est AFR Am: >60 mL/min (ref >60)
GFR, Estimated: 57 mL/min — ABNORMAL LOW (ref >60)
Glucose, Bld: 148 mg/dL — ABNORMAL HIGH (ref 70–99)
Potassium: 4.3 mmol/L (ref 3.5–5.1)
Sodium: 135 mmol/L (ref 135–145)
Total Bilirubin: 0.3 mg/dL (ref 0.3–1.2)
Total Protein: 6.9 g/dL (ref 6.5–8.1)

## 2019-12-16 LAB — CBC WITH DIFFERENTIAL (CANCER CENTER ONLY)
Abs Immature Granulocytes: 0.05 10*3/uL (ref 0.00–0.07)
Basophils Absolute: 0 10*3/uL (ref 0.0–0.1)
Basophils Relative: 0 %
Eosinophils Absolute: 0 10*3/uL (ref 0.0–0.5)
Eosinophils Relative: 1 %
HCT: 24.8 % — ABNORMAL LOW (ref 39.0–52.0)
Hemoglobin: 8.1 g/dL — ABNORMAL LOW (ref 13.0–17.0)
Immature Granulocytes: 1 %
Lymphocytes Relative: 28 %
Lymphs Abs: 1.8 10*3/uL (ref 0.7–4.0)
MCH: 29.3 pg (ref 26.0–34.0)
MCHC: 32.7 g/dL (ref 30.0–36.0)
MCV: 89.9 fL (ref 80.0–100.0)
Monocytes Absolute: 1.4 10*3/uL — ABNORMAL HIGH (ref 0.1–1.0)
Monocytes Relative: 23 %
Neutro Abs: 3 10*3/uL (ref 1.7–7.7)
Neutrophils Relative %: 47 %
Platelet Count: 240 10*3/uL (ref 150–400)
RBC: 2.76 MIL/uL — ABNORMAL LOW (ref 4.22–5.81)
RDW: 15.3 % (ref 11.5–15.5)
WBC Count: 6.4 10*3/uL (ref 4.0–10.5)
nRBC: 0 % (ref 0.0–0.2)

## 2019-12-16 LAB — MAGNESIUM: Magnesium: 1.9 mg/dL (ref 1.7–2.4)

## 2019-12-16 MED ORDER — SODIUM CHLORIDE 0.9 % IV SOLN
25.0000 mg/m2 | Freq: Once | INTRAVENOUS | Status: AC
  Administered 2019-12-16: 45 mg via INTRAVENOUS
  Filled 2019-12-16: qty 45

## 2019-12-16 MED ORDER — SODIUM CHLORIDE 0.9 % IV SOLN
Freq: Once | INTRAVENOUS | Status: AC
  Filled 2019-12-16: qty 250

## 2019-12-16 MED ORDER — SODIUM CHLORIDE 0.9 % IV SOLN
Freq: Once | INTRAVENOUS | Status: AC
  Filled 2019-12-16: qty 1000

## 2019-12-16 MED ORDER — PALONOSETRON HCL INJECTION 0.25 MG/5ML
0.2500 mg | Freq: Once | INTRAVENOUS | Status: AC
  Administered 2019-12-16: 0.25 mg via INTRAVENOUS

## 2019-12-16 MED ORDER — SODIUM CHLORIDE 0.9 % IV SOLN
800.0000 mg/m2 | Freq: Once | INTRAVENOUS | Status: AC
  Administered 2019-12-16: 1444 mg via INTRAVENOUS
  Filled 2019-12-16: qty 37.98

## 2019-12-16 MED ORDER — SODIUM CHLORIDE 0.9 % IV SOLN
Freq: Once | INTRAVENOUS | Status: AC
  Filled 2019-12-16: qty 5

## 2019-12-16 MED ORDER — SODIUM CHLORIDE 0.9% FLUSH
10.0000 mL | INTRAVENOUS | Status: DC | PRN
  Administered 2019-12-16: 10 mL
  Filled 2019-12-16: qty 10

## 2019-12-16 MED ORDER — PALONOSETRON HCL INJECTION 0.25 MG/5ML
INTRAVENOUS | Status: AC
  Filled 2019-12-16: qty 5

## 2019-12-16 MED ORDER — HEPARIN SOD (PORK) LOCK FLUSH 100 UNIT/ML IV SOLN
500.0000 [IU] | Freq: Once | INTRAVENOUS | Status: AC | PRN
  Administered 2019-12-16: 500 [IU]
  Filled 2019-12-16: qty 5

## 2019-12-16 NOTE — Patient Instructions (Signed)
[facility] Discharge Instructions for Patients Receiving Chemotherapy  Today you received the following chemotherapy agents: Cisplatin, Gemcitabine  To help prevent nausea and vomiting after your treatment, we encourage you to take your nausea medication as directed by your MD.   If you develop nausea and vomiting that is not controlled by your nausea medication, call the clinic.   BELOW ARE SYMPTOMS THAT SHOULD BE REPORTED IMMEDIATELY:  *FEVER GREATER THAN 100.5 F  *CHILLS WITH OR WITHOUT FEVER  NAUSEA AND VOMITING THAT IS NOT CONTROLLED WITH YOUR NAUSEA MEDICATION  *UNUSUAL SHORTNESS OF BREATH  *UNUSUAL BRUISING OR BLEEDING  TENDERNESS IN MOUTH AND THROAT WITH OR WITHOUT PRESENCE OF ULCERS  *URINARY PROBLEMS  *BOWEL PROBLEMS  UNUSUAL RASH Items with * indicate a potential emergency and should be followed up as soon as possible.  Feel free to call the clinic should you have any questions or concerns. The clinic phone number is [phone].  Please show the [facility] at check-in to the Emergency Department and triage nurse.  Coronavirus (COVID-19) Are you at risk?  Are you at risk for the Coronavirus (COVID-19)?  To be considered HIGH RISK for Coronavirus (COVID-19), you have to meet the following criteria:  . Traveled to [place], [place], [place], [place] or [place]; or in the [place] to [place], [place], [place], or [place]; and have fever, cough, and shortness of breath within the last 2 weeks of travel OR . Been in close contact with a person diagnosed with COVID-19 within the last 2 weeks and have fever, cough, and shortness of breath . IF YOU DO NOT MEET THESE CRITERIA, YOU ARE CONSIDERED LOW RISK FOR COVID-19.  What to do if you are HIGH RISK for COVID-19?  Marland Kitchen If you are having a medical emergency, call 911. . Seek medical care right away. Before you go to a doctor's office, urgent care or emergency department, call ahead and  tell them about your recent travel, contact with someone diagnosed with COVID-19, and your symptoms. You should receive instructions from your physician's office regarding next steps of care.  . When you arrive at healthcare provider, tell the healthcare staff immediately you have returned from visiting [place], [place], [place], [place] or [place]; or traveled in the [place] to [place], [place], [place], or [place]; in the last two weeks or you have been in close contact with a person diagnosed with COVID-19 in the last 2 weeks.   . Tell the health care staff about your symptoms: fever, cough and shortness of breath. . After you have been seen by a medical provider, you will be either: o Tested for (COVID-19) and discharged home on quarantine except to seek medical care if symptoms worsen, and asked to  - Stay home and avoid contact with others until you get your results (4-5 days)  - Avoid travel on public transportation if possible (such as bus, train, or airplane) or o Sent to the Emergency Department by EMS for evaluation, COVID-19 testing, and possible admission depending on your condition and test results.  What to do if you are LOW RISK for COVID-19?  Reduce your risk of any infection by using the same precautions used for avoiding the common cold or flu:  Marland Kitchen Wash your hands often with soap and warm water for at least 20 seconds.  If soap and water are not readily available, use an alcohol-based hand sanitizer with at least 60% alcohol.  Marland Kitchen  If coughing or sneezing, cover your mouth and nose by coughing or sneezing into the elbow areas of your shirt or coat, into a tissue or into your sleeve (not your hands). . Avoid shaking hands with others and consider head nods or verbal greetings only. . Avoid touching your eyes, nose, or mouth with unwashed hands.  . Avoid close contact with people who are sick. . Avoid places or events with large numbers of people in one location, like  concerts or sporting events. . Carefully consider travel plans you have or are making. . If you are planning any travel outside or inside the [place], visit the CDC's Travelers' Health webpage for the latest health notices. . If you have some symptoms but not all symptoms, continue to monitor at home and seek medical attention if your symptoms worsen. . If you are having a medical emergency, call 911.   ADDITIONAL HEALTHCARE OPTIONS FOR PATIENTS  [facility] / e-Visit: [email or web address]         [facility]: [phone]  [facility]: [phone]                   [facility]: [phone]  

## 2019-12-17 ENCOUNTER — Telehealth: Payer: Self-pay | Admitting: Nurse Practitioner

## 2019-12-17 LAB — CANCER ANTIGEN 19-9: CA 19-9: 44 U/mL — ABNORMAL HIGH (ref 0–35)

## 2019-12-17 NOTE — Progress Notes (Signed)
Pharmacist Chemotherapy Monitoring - Follow Up Assessment    I verify that I have reviewed each item in the below checklist:  . Regimen for the patient is scheduled for the appropriate day and plan matches scheduled date. Marland Kitchen Appropriate non-routine labs are ordered dependent on drug ordered. . If applicable, additional medications reviewed and ordered per protocol based on lifetime cumulative doses and/or treatment regimen.   Plan for follow-up and/or issues identified: No . I-vent associated with next due treatment: No . MD and/or nursing notified: No  Kennith Center, Pharm.D., CPP 12/17/2019@12 :27 PM

## 2019-12-17 NOTE — Telephone Encounter (Signed)
Scheduled appt per 3/18 los.  Spoke with pt and he is aware of the appt date and time.

## 2019-12-18 ENCOUNTER — Other Ambulatory Visit: Payer: Self-pay | Admitting: Hematology

## 2019-12-20 ENCOUNTER — Other Ambulatory Visit: Payer: Self-pay

## 2019-12-20 ENCOUNTER — Inpatient Hospital Stay: Payer: Medicare Other

## 2019-12-20 VITALS — BP 162/83 | HR 66 | Temp 98.1°F | Resp 17

## 2019-12-20 DIAGNOSIS — C221 Intrahepatic bile duct carcinoma: Secondary | ICD-10-CM

## 2019-12-20 DIAGNOSIS — I2510 Atherosclerotic heart disease of native coronary artery without angina pectoris: Secondary | ICD-10-CM | POA: Diagnosis not present

## 2019-12-20 DIAGNOSIS — D6481 Anemia due to antineoplastic chemotherapy: Secondary | ICD-10-CM

## 2019-12-20 DIAGNOSIS — K5900 Constipation, unspecified: Secondary | ICD-10-CM | POA: Diagnosis not present

## 2019-12-20 DIAGNOSIS — C782 Secondary malignant neoplasm of pleura: Secondary | ICD-10-CM | POA: Diagnosis not present

## 2019-12-20 DIAGNOSIS — R0609 Other forms of dyspnea: Secondary | ICD-10-CM | POA: Diagnosis not present

## 2019-12-20 DIAGNOSIS — J90 Pleural effusion, not elsewhere classified: Secondary | ICD-10-CM | POA: Diagnosis not present

## 2019-12-20 DIAGNOSIS — T451X5A Adverse effect of antineoplastic and immunosuppressive drugs, initial encounter: Secondary | ICD-10-CM

## 2019-12-20 LAB — CMP (CANCER CENTER ONLY)
ALT: 13 U/L (ref 0–44)
AST: 16 U/L (ref 15–41)
Albumin: 3.2 g/dL — ABNORMAL LOW (ref 3.5–5.0)
Alkaline Phosphatase: 57 U/L (ref 38–126)
Anion gap: 8 (ref 5–15)
BUN: 21 mg/dL (ref 8–23)
CO2: 27 mmol/L (ref 22–32)
Calcium: 8.9 mg/dL (ref 8.9–10.3)
Chloride: 101 mmol/L (ref 98–111)
Creatinine: 1.16 mg/dL (ref 0.61–1.24)
GFR, Est AFR Am: >60 mL/min (ref >60)
GFR, Estimated: >60 mL/min (ref >60)
Glucose, Bld: 81 mg/dL (ref 70–99)
Potassium: 4.1 mmol/L (ref 3.5–5.1)
Sodium: 136 mmol/L (ref 135–145)
Total Bilirubin: 0.6 mg/dL (ref 0.3–1.2)
Total Protein: 6.7 g/dL (ref 6.5–8.1)

## 2019-12-20 LAB — CBC WITH DIFFERENTIAL (CANCER CENTER ONLY)
Abs Immature Granulocytes: 0.01 10*3/uL (ref 0.00–0.07)
Basophils Absolute: 0 10*3/uL (ref 0.0–0.1)
Basophils Relative: 0 %
Eosinophils Absolute: 0 10*3/uL (ref 0.0–0.5)
Eosinophils Relative: 0 %
HCT: 25.6 % — ABNORMAL LOW (ref 39.0–52.0)
Hemoglobin: 8.2 g/dL — ABNORMAL LOW (ref 13.0–17.0)
Immature Granulocytes: 0 %
Lymphocytes Relative: 25 %
Lymphs Abs: 1.3 10*3/uL (ref 0.7–4.0)
MCH: 29.4 pg (ref 26.0–34.0)
MCHC: 32 g/dL (ref 30.0–36.0)
MCV: 91.8 fL (ref 80.0–100.0)
Monocytes Absolute: 0.3 10*3/uL (ref 0.1–1.0)
Monocytes Relative: 6 %
Neutro Abs: 3.5 10*3/uL (ref 1.7–7.7)
Neutrophils Relative %: 69 %
Platelet Count: 314 10*3/uL (ref 150–400)
RBC: 2.79 MIL/uL — ABNORMAL LOW (ref 4.22–5.81)
RDW: 15.3 % (ref 11.5–15.5)
WBC Count: 5.1 10*3/uL (ref 4.0–10.5)
nRBC: 0 % (ref 0.0–0.2)

## 2019-12-20 LAB — SAMPLE TO BLOOD BANK

## 2019-12-20 LAB — MAGNESIUM: Magnesium: 1.8 mg/dL (ref 1.7–2.4)

## 2019-12-20 LAB — ABO/RH: ABO/RH(D): A POS

## 2019-12-20 LAB — PREPARE RBC (CROSSMATCH)

## 2019-12-20 MED ORDER — SODIUM CHLORIDE 0.9% FLUSH
10.0000 mL | INTRAVENOUS | Status: AC | PRN
  Administered 2019-12-20: 10 mL
  Filled 2019-12-20: qty 10

## 2019-12-20 MED ORDER — SODIUM CHLORIDE 0.9% IV SOLUTION
250.0000 mL | Freq: Once | INTRAVENOUS | Status: AC
  Filled 2019-12-20: qty 250

## 2019-12-20 MED ORDER — HEPARIN SOD (PORK) LOCK FLUSH 100 UNIT/ML IV SOLN
500.0000 [IU] | Freq: Every day | INTRAVENOUS | Status: AC | PRN
  Administered 2019-12-20: 500 [IU]
  Filled 2019-12-20: qty 5

## 2019-12-20 NOTE — Addendum Note (Signed)
Addended by: Scot Dock on: 12/20/2019 11:08 AM   Modules accepted: Orders

## 2019-12-20 NOTE — Addendum Note (Signed)
Addended by: Scot Dock on: 12/20/2019 09:40 AM   Modules accepted: Orders

## 2019-12-20 NOTE — Addendum Note (Signed)
Addended by: Smiley Houseman F on: 12/20/2019 11:17 AM   Modules accepted: Orders

## 2019-12-20 NOTE — Patient Instructions (Signed)

## 2019-12-21 LAB — TYPE AND SCREEN
ABO/RH(D): A POS
Antibody Screen: NEGATIVE
Unit division: 0

## 2019-12-21 LAB — BPAM RBC
Blood Product Expiration Date: 202103262359
ISSUE DATE / TIME: 202103221106
Unit Type and Rh: 6200

## 2019-12-22 ENCOUNTER — Other Ambulatory Visit: Payer: Self-pay

## 2019-12-22 DIAGNOSIS — C221 Intrahepatic bile duct carcinoma: Secondary | ICD-10-CM

## 2019-12-23 ENCOUNTER — Ambulatory Visit (HOSPITAL_BASED_OUTPATIENT_CLINIC_OR_DEPARTMENT_OTHER): Payer: Medicare Other | Admitting: Hematology

## 2019-12-23 ENCOUNTER — Inpatient Hospital Stay: Payer: Medicare Other

## 2019-12-23 ENCOUNTER — Encounter: Payer: Self-pay | Admitting: Hematology

## 2019-12-23 ENCOUNTER — Other Ambulatory Visit: Payer: Self-pay | Admitting: Hematology

## 2019-12-23 ENCOUNTER — Telehealth: Payer: Self-pay | Admitting: Hematology

## 2019-12-23 ENCOUNTER — Other Ambulatory Visit: Payer: Self-pay

## 2019-12-23 VITALS — BP 155/87 | HR 75 | Temp 100.5°F | Resp 18

## 2019-12-23 DIAGNOSIS — C782 Secondary malignant neoplasm of pleura: Secondary | ICD-10-CM | POA: Diagnosis not present

## 2019-12-23 DIAGNOSIS — C221 Intrahepatic bile duct carcinoma: Secondary | ICD-10-CM

## 2019-12-23 DIAGNOSIS — R509 Fever, unspecified: Secondary | ICD-10-CM

## 2019-12-23 DIAGNOSIS — Z95828 Presence of other vascular implants and grafts: Secondary | ICD-10-CM

## 2019-12-23 DIAGNOSIS — R0609 Other forms of dyspnea: Secondary | ICD-10-CM | POA: Diagnosis not present

## 2019-12-23 DIAGNOSIS — J90 Pleural effusion, not elsewhere classified: Secondary | ICD-10-CM | POA: Diagnosis not present

## 2019-12-23 DIAGNOSIS — I2510 Atherosclerotic heart disease of native coronary artery without angina pectoris: Secondary | ICD-10-CM | POA: Diagnosis not present

## 2019-12-23 DIAGNOSIS — K5900 Constipation, unspecified: Secondary | ICD-10-CM | POA: Diagnosis not present

## 2019-12-23 LAB — CBC WITH DIFFERENTIAL (CANCER CENTER ONLY)
Abs Immature Granulocytes: 0.06 10*3/uL (ref 0.00–0.07)
Basophils Absolute: 0 10*3/uL (ref 0.0–0.1)
Basophils Relative: 0 %
Eosinophils Absolute: 0 10*3/uL (ref 0.0–0.5)
Eosinophils Relative: 0 %
HCT: 29 % — ABNORMAL LOW (ref 39.0–52.0)
Hemoglobin: 9.6 g/dL — ABNORMAL LOW (ref 13.0–17.0)
Immature Granulocytes: 1 %
Lymphocytes Relative: 15 %
Lymphs Abs: 1.1 10*3/uL (ref 0.7–4.0)
MCH: 29.6 pg (ref 26.0–34.0)
MCHC: 33.1 g/dL (ref 30.0–36.0)
MCV: 89.5 fL (ref 80.0–100.0)
Monocytes Absolute: 0.9 10*3/uL (ref 0.1–1.0)
Monocytes Relative: 12 %
Neutro Abs: 5.5 10*3/uL (ref 1.7–7.7)
Neutrophils Relative %: 72 %
Platelet Count: 218 10*3/uL (ref 150–400)
RBC: 3.24 MIL/uL — ABNORMAL LOW (ref 4.22–5.81)
RDW: 15.3 % (ref 11.5–15.5)
WBC Count: 7.7 10*3/uL (ref 4.0–10.5)
nRBC: 0 % (ref 0.0–0.2)

## 2019-12-23 LAB — MAGNESIUM: Magnesium: 1.8 mg/dL (ref 1.7–2.4)

## 2019-12-23 LAB — CMP (CANCER CENTER ONLY)
ALT: 28 U/L (ref 0–44)
AST: 34 U/L (ref 15–41)
Albumin: 3.3 g/dL — ABNORMAL LOW (ref 3.5–5.0)
Alkaline Phosphatase: 76 U/L (ref 38–126)
Anion gap: 10 (ref 5–15)
BUN: 17 mg/dL (ref 8–23)
CO2: 26 mmol/L (ref 22–32)
Calcium: 9.1 mg/dL (ref 8.9–10.3)
Chloride: 100 mmol/L (ref 98–111)
Creatinine: 1.3 mg/dL — ABNORMAL HIGH (ref 0.61–1.24)
GFR, Est AFR Am: >60 mL/min (ref >60)
GFR, Estimated: 55 mL/min — ABNORMAL LOW (ref >60)
Glucose, Bld: 130 mg/dL — ABNORMAL HIGH (ref 70–99)
Potassium: 4.2 mmol/L (ref 3.5–5.1)
Sodium: 136 mmol/L (ref 135–145)
Total Bilirubin: 0.5 mg/dL (ref 0.3–1.2)
Total Protein: 7.2 g/dL (ref 6.5–8.1)

## 2019-12-23 LAB — URINALYSIS, COMPLETE (UACMP) WITH MICROSCOPIC
Bilirubin Urine: NEGATIVE
Glucose, UA: 50 mg/dL — AB
Hgb urine dipstick: NEGATIVE
Ketones, ur: NEGATIVE mg/dL
Leukocytes,Ua: NEGATIVE
Nitrite: NEGATIVE
Protein, ur: NEGATIVE mg/dL
Specific Gravity, Urine: 1.014 (ref 1.005–1.030)
pH: 7 (ref 5.0–8.0)

## 2019-12-23 MED ORDER — SODIUM CHLORIDE 0.9% FLUSH
10.0000 mL | INTRAVENOUS | Status: DC | PRN
  Administered 2019-12-23: 10 mL
  Filled 2019-12-23: qty 10

## 2019-12-23 MED ORDER — LEVOFLOXACIN 750 MG PO TABS: 750.0000 mg | ORAL_TABLET | Freq: Every day | ORAL | 0 refills | Status: DC

## 2019-12-23 MED ORDER — SODIUM CHLORIDE 0.9 % IV SOLN
INTRAVENOUS | Status: AC
  Filled 2019-12-23 (×2): qty 250

## 2019-12-23 MED ORDER — HEPARIN SOD (PORK) LOCK FLUSH 100 UNIT/ML IV SOLN
500.0000 [IU] | Freq: Once | INTRAVENOUS | Status: AC | PRN
  Administered 2019-12-23: 500 [IU]
  Filled 2019-12-23: qty 5

## 2019-12-23 NOTE — Progress Notes (Signed)
[facility]   Telephone:(336) [phone] Fax:(336) [phone]   Clinic Follow up Note   Patient Care Team: [name], DO as PCP - General (Family Medicine)  Date of Service:  12/23/2019  CHIEF COMPLAINT: Body aches and fever   SUMMARY OF ONCOLOGIC HISTORY: Oncology History  Cholangiocarcinoma ([place])  01/14/2019 Imaging   US Abdomen 01/14/19  IMPRESSION: There is significant intrahepatic and extrahepatic biliary dilatation present, concerning for distal common bile duct obstruction. Further evaluation with CT scan or MRCP is recommended. Correlation with liver function tests is recommended as well. These results will be called to the ordering clinician or representative by the Radiologist Assistant, and communication documented in the PACS or zVision Dashboard.   Probable large amount of sludge seen within gallbladder lumen with mild gallbladder wall thickening. However, no cholelithiasis, pericholecystic fluid or sonographic Murphy's sign is noted.   4.2 cm septated cyst seen in upper pole of right kidney consistent with Bosniak type 2 lesion. Follow-up ultrasound in 1 year is recommended to ensure stability.   01/20/2019 Imaging   MRI abdomen 01/20/19  IMPRESSION: 1. Findings are highly concerning for central tumor in the biliary tract at the confluence of the common hepatic duct, cystic duct and common bile ducts. Further clinical evaluation for potential cholangiocarcinoma is strongly recommended. 2. Mild ductal dilatation of the main pancreatic duct throughout the distal body and tail of the pancreas where there is also some associated side branch ectasia. This may suggest a pancreatic ductal stricture. No obstructing pancreatic neoplasm identified. 3. Aortic atherosclerosis.   01/21/2019 Initial Biopsy   Diagnosis 01/21/19  BILE DUCT BRUSHING (SPECIMEN 1 OF 1 COLLECTED 01/21/2019) ADENOCARCINOMA.   01/21/2019 Procedure   ERCP By Dr hung  01/21/19 IMRPESSION - The major papilla appeared normal. - A single localized biliary stricture was found in the middle third of the main bile duct. - The entire main bile duct and upper third of the main bile duct were dilated, secondary to a stricture. - A biliary sphincterotomy was performed. - Cells for cytology obtained in the middle third of the main bile duct. - One plastic stent was placed into the common bile duct.  EUS by Dr hung 01/21/19  IMPRESSION - There was dilation in the middle third of the main bile duct and in the upper third of the main bile duct which measured up to 15 mm. - There was a suggestion of a stricture in the middle third of the main bile duct. - No specimens collected.   02/03/2019 Imaging   CT CAP [facility] 02/03/19  Impression:  1. There is mild intrahepatic and extrahepatic biliary ductal dilation and diffuse common bile duct wall thickening with stent in place. There is abnormal soft tissue measuring approximately 1.6 cm between the common hepatic artery, portal vein and common bile duct, worrisome for the known cholangiocarcinoma.  2. Periportal lymphadenopathy which may represent nodal metastasis.  4. Marked pancreatic atrophy and duct dilation involving the distal body and tail the pancreas where there is a coarse calcification. These findings are favored to represent stenosis from intraductal stones though an underlying stricture cannot be completely excluded.  5. Atypical symmetric prominent fat stranding of the bilateral lower abdominal wall. Correlate with surgical history or history of history of trauma.   03/08/2019 Initial Diagnosis   Cholangiocarcinoma ([place])   03/12/2019 -  Chemotherapy   gemcitabine and cisplatin on days 1 and 8 every 21 days starting 03/12/19. Abraxane added with cycle 2. Added GCSF (/  [name]) to day 9 starting cycle 2.    03/19/2019 Cancer Staging   Staging form: Perihilar Bile Ducts, AJCC 8th Edition - Clinical:  Stage Unknown (cTX, cN0, cM0) - Signed by [name], MD on 03/19/2019   05/24/2019 Imaging   CT CAP W Contrast at [facility]  1.  Interval placement of covered metallic biliary stent. No progressive dilation of the biliary tree and resulting resolution of the prior main pancreatic duct dilation. 2.  Evidence of some subtle vascular contour deformity involving the hepatic artery, suspected to be from tumoral contact and/or posttreatment changes. 3.  New small right pleural effusion with overlying mild airspace disease. Secondary findings which may indicate a component of aspiration. 4.  No convincing evidence of new disseminated metastatic disease. 5.  Additional findings as discussed above.   06/18/2019 Imaging   CT AP IMPRESSION: 1.  No acute findings in the abdomen/pelvis. 2. Biliary stent in adequate position. No evidence of focal mass or adenopathy in the region of the pancreatic head or porta hepatis. 3. Moderate size right pleural effusion with associated right basilar atelectasis. 4. Possible single gallstone. Stable subcentimeter liver cyst. Stable right renal cysts. Small left inguinal hernia containing only peritoneal fat. 5. Aortic Atherosclerosis (ICD10-I70.0).   07/28/2019 Imaging   CT CAP IMPRESSION: 1. The patient is status post common bile duct stent placement, with unchanged stent position and patent appearance with pneumobilia. Primary cholangiocarcinoma is not discretely appreciated by CT. 2. No direct evidence of metastatic disease in the chest, abdomen, or pelvis. 3. Moderate right pleural effusion with associated atelectasis or consolidation, slightly improved compared to prior examination. 4. The pancreatic parenchyma is atrophic and calcified, particularly in the pancreatic tail. 5.  Coronary artery disease.  Aortic atherosclerosis   10/20/2019 PET scan   IMPRESSION: 1. Redemonstrated post treatment and post stenting appearance of a central cholangiocarcinoma,  which is not directly appreciated by CT. Common bile duct stent remains in position with post stenting pneumobilia. 2. Slight interval increase in a moderate to large right pleural effusion with associated atelectasis or consolidation. There may be some pleural nodularity about the right hemidiaphragm which appears new compared to prior examination (series 2, image 40), highly suspicious for pleural metastatic disease. 3. No direct evidence of metastatic disease in the chest, abdomen, or pelvis. 4. Coronary artery disease.  Aortic Atherosclerosis (ICD10-I70.0).     10/29/2019 Pathology Results   Thoracentesis FINAL MICROSCOPIC DIAGNOSIS:  - Reactive mesothelial cells present    11/17/2019 PET scan   IMPRESSION: 1. Low-level hypermetabolism within the right pleural space, without focal abnormality to strongly suggest pleural metastasis. 2. Hypermetabolism along the course of the common duct stent, nonspecific and most likely reactive. 3. Otherwise, no evidence of hypermetabolic metastasis. 4. Small to moderate right pleural effusion with developing loculation superiorly. 5. Coronary artery atherosclerosis. Aortic Atherosclerosis (ICD10-I70.0). 6.  Possible constipation.        CURRENT THERAPY:  gemcitabine and cisplatin on days 1 and 8 every 21 days starting 03/12/19. Abraxane added with cycle 2. Added GCSF [name]) to day 9 starting cycle 2.   INTERVAL HISTORY:  Christopher Welch is here for a follow up. He woke up this morning with body aches and cough and mild congestion. This has been occasional lately. His check in temp was 93F and repeated in infusion room was 102F. He notes mild nausea and denies headaches or sore throat and stable mild SOB. He denies LE edema. He notes he had carnation essential breakfast which  he was able to keep down this morning. He notes he had his first COVID19 vaccine already and plans to get the second on 3/30.   He notes his last cycle chemo went  stable and well.    REVIEW OF SYSTEMS:   Constitutional: (+) Fever (+) body aches  Eyes: Denies blurriness of vision Ears, nose, mouth, throat, and face: Denies mucositis or sore throat Respiratory: Denies or wheezes (+) Cough with mild congestion  Cardiovascular: Denies palpitation, chest discomfort or lower extremity swelling Gastrointestinal:  Denies heartburn or change in bowel habits (+) nasuea Skin: Denies abnormal skin rashes Lymphatics: Denies new lymphadenopathy or easy bruising Neurological:Denies numbness, tingling or new weaknesses Behavioral/Psych: Mood is stable, no new changes  All other systems were reviewed with the patient and are negative.  MEDICAL HISTORY:  Past Medical History:  Diagnosis Date  . Diabetes mellitus type I, controlled ([facility])   . Dyslipidemia   . ED (erectile dysfunction)   . Essential hypertension   . Gilbert's disease   . Hepatitis B carrier [facility])     SURGICAL HISTORY: Past Surgical History:  Procedure Laterality Date  . APPENDECTOMY  1964  . BILIARY BRUSHING  01/21/2019   Procedure: BILIARY BRUSHING;  Surgeon: Carol Ada, MD;  Location: [facility];  Service: Endoscopy;;  . BILIARY STENT PLACEMENT N/A 01/21/2019   Procedure: BILIARY STENT PLACEMENT;  Surgeon: Carol Ada, MD;  Location: [facility];  Service: Endoscopy;  Laterality: N/A;  . ENDOSCOPIC RETROGRADE CHOLANGIOPANCREATOGRAPHY (ERCP) WITH PROPOFOL N/A 01/21/2019   Procedure: ENDOSCOPIC RETROGRADE CHOLANGIOPANCREATOGRAPHY (ERCP) WITH PROPOFOL;  Surgeon: Carol Ada, MD;  Location: [facility];  Service: Endoscopy;  Laterality: N/A;  . ESOPHAGOGASTRODUODENOSCOPY (EGD) WITH PROPOFOL N/A 01/21/2019   Procedure: ESOPHAGOGASTRODUODENOSCOPY (EGD) WITH PROPOFOL;  Surgeon: Carol Ada, MD;  Location: [facility];  Service: Endoscopy;  Laterality: N/A;  . FEMORAL HERNIA REPAIR    . IR CHOLANGIOGRAM EXISTING TUBE  05/13/2019  . IR CHOLANGIOGRAM EXISTING TUBE  06/08/2019  . IR  EXCHANGE BILIARY DRAIN  05/18/2019  . IR EXCHANGE BILIARY DRAIN  06/08/2019  . IR IMAGING GUIDED PORT INSERTION  03/08/2019  . IR PERC CHOLECYSTOSTOMY  05/04/2019  . IR THORACENTESIS ASP PLEURAL SPACE W/IMG GUIDE  10/29/2019  . LEFT HEART CATH AND CORONARY ANGIOGRAPHY N/A 05/19/2017   Procedure: LEFT HEART CATH AND CORONARY ANGIOGRAPHY;  Surgeon: Leonie Man, MD;  Location: [facility];  Service: Cardiovascular;  Laterality: N/A;  . SPHINCTEROTOMY  01/21/2019   Procedure: SPHINCTEROTOMY;  Surgeon: Carol Ada, MD;  Location: [facility];  Service: Endoscopy;;  . UPPER ESOPHAGEAL ENDOSCOPIC ULTRASOUND (EUS) N/A 01/21/2019   Procedure: UPPER ESOPHAGEAL ENDOSCOPIC ULTRASOUND (EUS);  Surgeon: Carol Ada, MD;  Location: [facility];  Service: Endoscopy;  Laterality: N/A;    I have reviewed the social history and family history with the patient and they are unchanged from previous note.  ALLERGIES:  is allergic to erythromycin.  MEDICATIONS:  Current Outpatient Medications  Medication Sig Dispense Refill  . acetaminophen (TYLENOL) 500 MG tablet Take 1,000 mg by mouth 2 (two) times daily as needed for moderate pain or headache.    Marland Kitchen amLODipine (NORVASC) 5 MG tablet Take 5 mg by mouth daily.     Marland Kitchen atenolol (TENORMIN) 25 MG tablet Take 25 mg by mouth daily. Once a day     . atorvastatin (LIPITOR) 10 MG tablet Take 10 mg by mouth daily.    . Blood Glucose Monitoring Suppl (ACCU-CHEK NANO SMARTVIEW) W/DEVICE KIT 1 kit by  Does not apply route 2 (two) times daily. (Patient taking differently: 1 kit by Does not apply route See admin instructions. Test blood sugars 12x's daily) 1 kit 0  . clonazePAM (KLONOPIN) 0.5 MG tablet     . glucose blood test strip Test 3 times a day. (Patient taking differently: 1 each by Other route See admin instructions. Test 12 times a day.) 300 each Prn  . insulin NPH Human (NOVOLIN N) 100 UNIT/ML injection Inject 20 Units into the skin at bedtime.     . insulin  regular (NOVOLIN R) 100 units/mL injection Inject 20 Units into the skin 3 (three) times daily before meals.     . lidocaine-prilocaine (EMLA) cream Apply to affected area once 30 g 3  . lisinopril (ZESTRIL) 20 MG tablet Take 20 mg by mouth daily.     Marland Kitchen LORazepam (ATIVAN) 0.5 MG tablet Take 0.5-1 tablets (0.25-0.5 mg total) by mouth every 8 (eight) hours as needed (nausea and vomiting). 30 tablet 0  . magnesium oxide (MAG-OX) 400 MG tablet TAKE 1 TABLET BY MOUTH EVERY DAY 90 tablet 1  . metFORMIN (GLUCOPHAGE) 1000 MG tablet Take 2,000 mg by mouth every evening.     . mirtazapine (REMERON) 7.5 MG tablet TAKE 1 TABLET (7.5 MG TOTAL) BY MOUTH AT BEDTIME. 90 tablet 1  . ondansetron (ZOFRAN) 8 MG tablet TAKE 1 TABLET BY MOUTH 2 TIMES DAY AS NEEDED. START ON 3RD DAY AFTER CHEMOTHERAPY 30 tablet 1  . oxycodone (OXY-IR) 5 MG capsule Take 1 capsule (5 mg total) by mouth every 6 (six) hours as needed for pain (severe pain). 20 capsule 0  . prochlorperazine (COMPAZINE) 10 MG tablet TAKE 1 TABLET (10 MG TOTAL) BY MOUTH EVERY 6 (SIX) HOURS AS NEEDED (NAUSEA OR VOMITING). 30 tablet 1  . zolpidem (AMBIEN) 5 MG tablet Take 1-2 tablets (5-10 mg total) by mouth at bedtime as needed for sleep. 60 tablet 0   No current facility-administered medications for this visit.   Facility-Administered Medications Ordered in Other Visits  Medication Dose Route Frequency Provider Last Rate Last Admin  . 0.9 %  sodium chloride infusion (Manually program via Guardrails IV Fluids)  250 mL Intravenous Once [name], MD      . 0.9 %  sodium chloride infusion   Intravenous Continuous [name], MD 500 mL/hr at 12/23/19 0934 New Bag at 12/23/19 0934  . sodium chloride flush (NS) 0.9 % injection 10 mL  10 mL Intracatheter PRN [name], MD   10 mL at 12/23/19 0755    PHYSICAL EXAMINATION: ECOG PERFORMANCE STATUS: 2 - Symptomatic, <50% confined to bed Blood pressure 155/87, heart rate 93, respirate 18, temperature 100.2, pulse ox  99% on room air GENERAL:alert, no distress and comfortable SKIN: skin color, texture, turgor are normal, no rashes or significant lesions EYES: normal, Conjunctiva are pink and non-injected, sclera clear  NECK: supple, thyroid normal size, non-tender, without nodularity LYMPH:  no palpable lymphadenopathy in the cervical, axillary  LUNGS: clear to auscultation and percussion (+) lower left breath sounds  HEART: regular rate & rhythm and no murmurs and no lower extremity edema ABDOMEN:abdomen soft, non-tender and normal bowel sounds Musculoskeletal:no cyanosis of digits and no clubbing  NEURO: alert & oriented x 3 with fluent speech, no focal motor/sensory deficits  LABORATORY DATA:  I have reviewed the data as listed CBC Latest Ref Rng & Units 12/23/2019 12/20/2019 12/16/2019  WBC 4.0 - 10.5 K/uL 7.7 5.1 6.4  Hemoglobin 13.0 - 17.0 g/dL  9.6(L) 8.2(L) 8.1(L)  Hematocrit 39.0 - 52.0 % 29.0(L) 25.6(L) 24.8(L)  Platelets 150 - 400 K/uL 218 314 240     CMP Latest Ref Rng & Units 12/23/2019 12/20/2019 12/16/2019  Glucose 70 - 99 mg/dL 130(H) 81 148(H)  BUN 8 - 23 mg/dL 17 21 17   Creatinine 0.61 - 1.24 mg/dL 1.30(H) 1.16 1.25(H)  Sodium 135 - 145 mmol/L 136 136 135  Potassium 3.5 - 5.1 mmol/L 4.2 4.1 4.3  Chloride 98 - 111 mmol/L 100 101 101  CO2 22 - 32 mmol/L 26 27 25   Calcium 8.9 - 10.3 mg/dL 9.1 8.9 9.0  Total Protein 6.5 - 8.1 g/dL 7.2 6.7 6.9  Total Bilirubin 0.3 - 1.2 mg/dL 0.5 0.6 0.3  Alkaline Phos 38 - 126 U/L 76 57 61  AST 15 - 41 U/L 34 16 16  ALT 0 - 44 U/L 28 13 9       RADIOGRAPHIC STUDIES: I have personally reviewed the radiological images as listed and agreed with the findings in the report. No results found.   ASSESSMENT & PLAN:  Christopher Welch is a 74 y.o. male with   1. Fever and Body aches, cough   -This morning he woke up with new body aches and continued occasional cough with mild congestion which he has had lately.  -He ate and was able to keep it down but  has been nauseous this morning.  -His check in temp was 31F but his temp in infusion room was 100.57F. He does have lowered breath sounds on the left on exam today (12/23/19) secondary to pleural effusion, otherwise negative exam -He notes he had first COVID19 vaccine and plans to have the second on 3/30  -He denies any other chest, abdominal issues or headaches.  -I will hold chemo treatment today, give IV fluids, do blood and urine cultures and I recommend he get tested for COVID today   2. Extrahepatic cholangiocarcinoma, cTxN0M0 -on chemo cisplatin and gemcitabine, last dose one week ago -will check on him again early next week, if he recovers well, will restart chemo next week    PLAN:  -Give IV Fluids NS 500ML over one hour  -Labs with blood and urine culture  -COVID19 Test tomorrow (at [facility], appointment made for him  -will call in levofloxacin 724m daily for 7 days while we are waiting for test results    No problem-specific Assessment & Plan notes found for this encounter.   No orders of the defined types were placed in this encounter.  All questions were answered. The patient knows to call the clinic with any problems, questions or concerns. No barriers to learning was detected. The total time spent in the appointment was 30 minutes.     YTruitt Merle MD 12/23/2019   I, AJoslyn Devon am acting as scribe for YTruitt Merle MD.   I have reviewed the above documentation for accuracy and completeness, and I agree with the above.

## 2019-12-23 NOTE — Progress Notes (Signed)
Pt reports that he started having chills & generally not feeling well this morning.  Oral temp 100.2  Dr. Burr Medico informed, is coming to see pt., ordering blood cultures.

## 2019-12-23 NOTE — Telephone Encounter (Signed)
Added appt with dr Burr Medico for today per 3/25 sch msg

## 2019-12-23 NOTE — Progress Notes (Signed)
Appointment mad at [facility] for [place] testing on 12/24/2019 at [place].  Provided this to Mr Parks.

## 2019-12-23 NOTE — Patient Instructions (Signed)
Blood Culture Test Why am I having this test? A blood culture test is performed to see if you have an infection in your blood (septicemia). This test may be ordered if you have fever, chills, nausea, or fatigue, and your health care provider suspects septicemia. What is being tested? Your sample will be tested for the presence of bacteria or fungi that can cause septicemia. What kind of sample is taken? At least two blood samples from two different veins are required for this test. The blood samples are usually collected by inserting a needle into a blood vessel. Two samples are taken because:  There is a better chance of finding the infection with more than one sample.  There is a better chance of ruling out a false-positive result. Despite good cleaning, germs can remain on the skin where the blood is collected. This will result in a false-positive blood culture. How do I prepare for this test? Tell your health care provider if you are taking antibiotic medicine. It is recommended that blood samples be collected before starting this medicine. If blood cultures are performed while you are on an antibiotic, the blood samples should be collected shortly before you take a dose of the medicine. How are the results reported? Your test results will be reported as either positive or negative. For this test, a normal finding is a negative blood culture. A false-positive result can occur. A false positive is incorrect because it indicates that a condition is present when it is not. A false-negative result can occur. A false negative is incorrect because it indicates that a condition is not present when it is. What do the results mean? A positive blood culture test may mean that you have septicemia. Septicemia can indicate a serious infection. Talk with your health care provider about what your results mean. Questions to ask your health care provider Ask your health care provider, or the department that  is doing the test:  When will my results be ready?  How will I get my results?  What are my treatment options?  What other tests do I need?  What are my next steps? Summary  A blood culture test is performed to see if you have an infection in your blood (septicemia).  At least two blood samples from two different veins are required for this test. This gives a better chance of finding an infection and ruling out a false-positive result.  The normal result for this test is a negative blood culture. A positive result may mean that you have septicemia.  Talk with your health care provider about what your results mean. This information is not intended to replace advice given to you by your health care provider. Make sure you discuss any questions you have with your health care provider. Document Revised: 05/27/2017 Document Reviewed: 05/27/2017 Elsevier Patient Education  2020 [employer].   Urine Culture and Sensitivity Testing Why am I having this test? A urine culture is a test to see if bacteria or yeast grow from your urine sample. Normally, urine is mostly germ-free. Bacteria or yeast in the urine may cause a urinary tract infection (UTI). You may have this test:  If you have symptoms of a UTI, such as: ? Frequent urination or passing small amounts of urine frequently. ? Burning or pain when urinating. ? Needing to urinate urgently.  If you are pregnant. Pregnant women are at increased risk for UTIs and are screened for UTIs routinely. What is being tested? This  test checks whether any of the following are present in your urine:  Bacteria.  Yeast. What kind of sample is taken? A urine sample is required for this test. The urine must be collected in a way that prevents the bacteria that is always on the skin (normal flora) from getting into the sample. There are two ways to do this:  The clean-catch method. This is the most common way of getting a clean urine sample. You  may collect a clean-catch sample at home or at the lab. Your health care provider may give you sterile wipes to clean your vagina or penis to prepare for collecting a clean-catch sample.  Inserting a small, thin tube (catheter) into the part of the body that drains urine from the bladder (urethra). This method allows urine to be collected directly from the bladder. ? In women, the urethral opening is just above the vaginal opening. ? In men, the urethra opens at the tip of the penis. How do I prepare for this test?  Do not urinate for about an hour before collecting the sample.  Drink a Camera of water about 20 minutes before collecting the sample. What happens during the test? Your urine sample will be placed onto plates that contain a substance that encourages bacteria and yeast to grow (agar plates). These plates will be kept at body temperature for 24-48 hours to see if bacteria, yeast, or other germs grow. Then, the plates will be examined under a microscope. Any bacteria or yeast that grows from the culture will be tested against a variety of medicines to find the medicine that works best (sensitivity testing). For a UTI caused by bacteria, several types of antibiotic medicines may be tested. How are the results reported? Your culture test results will be reported as either positive or negative. Your sensitivity test results will be reported as a list of medicines that can be used to treat your infection. What do the results mean? A positive test result means that bacteria or yeast grew from your urine sample. This may mean that you have a UTI, and you may need to start taking antibiotic or antifungal medicines based on your sensitivity test results. A negative test result means that no or few bacteria or yeast grew from your sample after 24-48 hours. This means that it is less likely that you have a UTI. If you still have symptoms, your test may be repeated. A contaminated test result means  that many different types of bacteria or yeast grew in your urine sample, and your sample most likely has normal flora in it. This sample cannot be used to make a diagnosis, and your test may need to be repeated. Talk with your health care provider about what your results mean. Questions to ask your health care provider Ask your health care provider, or the department that is doing the test:  When will my results be ready?  How will I get my results?  What are my treatment options?  What other tests do I need?  What are my next steps? Summary  A urine culture is a test to see if bacteria or yeast grow from your urine sample.  A urine sample may be collected using the clean-catch method or a urinary catheter.  Your urine sample will be placed onto plates that contain a substance that encourages bacteria and yeast to grow.  Any bacteria or yeast that grows from the culture will be tested against a variety of medicines to  find the medicine that works best (sensitivity testing). This information is not intended to replace advice given to you by your health care provider. Make sure you discuss any questions you have with your health care provider. Document Revised: 05/29/2017 Document Reviewed: 05/29/2017 Elsevier Patient Education  [place] IN AN ANTIBIOTIC FOR YOU TODAY.  PLEASE PICK UP YOUR PRESCRIPTION TODAY AND TAKE AS DIRECTED.  YOU WILL BE CALLED WITH CULTURE RESULTS.

## 2019-12-24 ENCOUNTER — Ambulatory Visit: Payer: Medicare Other | Attending: Internal Medicine

## 2019-12-24 DIAGNOSIS — Z20822 Contact with and (suspected) exposure to covid-19: Secondary | ICD-10-CM | POA: Diagnosis not present

## 2019-12-24 LAB — URINE CULTURE: Culture: NO GROWTH

## 2019-12-25 ENCOUNTER — Ambulatory Visit: Payer: Medicare Other

## 2019-12-25 ENCOUNTER — Encounter: Payer: Self-pay | Admitting: Hematology

## 2019-12-25 LAB — SARS-COV-2, NAA 2 DAY TAT

## 2019-12-25 LAB — NOVEL CORONAVIRUS, NAA: SARS-CoV-2, NAA: NOT DETECTED

## 2019-12-28 ENCOUNTER — Ambulatory Visit: Payer: Medicare Other | Attending: Internal Medicine

## 2019-12-28 DIAGNOSIS — Z23 Encounter for immunization: Secondary | ICD-10-CM

## 2019-12-28 LAB — CULTURE, BLOOD (SINGLE)
Culture: NO GROWTH
Culture: NO GROWTH

## 2019-12-28 NOTE — Progress Notes (Signed)
   [facility]  Name:  Christopher Welch    MRN: [id number] DOB: November 28, 1945  12/28/2019  Mr. Christopher Welch was observed post Covid-19 immunization for 15 minutes without incident. He was provided with Vaccine Information Sheet and instruction to access the V-Safe system.   Mr. Christopher Welch was instructed to call 911 with any severe reactions post vaccine: Marland Kitchen Difficulty breathing  . Swelling of face and throat  . A fast heartbeat  . A bad rash all over body  . Dizziness and weakness   Immunizations Administered    Name Date Dose VIS Date Route   Pfizer COVID-19 Vaccine 12/28/2019  1:56 PM 0.3 mL 09/10/2019 Intramuscular   Manufacturer: [place]   Lot: [id number]   [place]: [id number]

## 2019-12-30 NOTE — Progress Notes (Signed)
Christopher Welch   [phone] [phone]   Clinic Follow up Note   Patient Care Team: [name], DO as PCP - General (Family Medicine)  Date of Service:  12/31/2019  CHIEF COMPLAINT: F/u of ExtrahepaticCholangiocarcinoma  SUMMARY OF ONCOLOGIC HISTORY: Oncology History  Cholangiocarcinoma ([place])  01/14/2019 Imaging   US Abdomen 01/14/19  IMPRESSION: There is significant intrahepatic and extrahepatic biliary dilatation present, concerning for distal common bile duct obstruction. Further evaluation with CT scan or MRCP is recommended. Correlation with liver function tests is recommended as well. These results will be called to the ordering clinician or representative by the Radiologist Assistant, and communication documented in the PACS or zVision Dashboard.   Probable large amount of sludge seen within gallbladder lumen with mild gallbladder wall thickening. However, no cholelithiasis, pericholecystic fluid or sonographic Murphy's sign is noted.   4.2 cm septated cyst seen in upper pole of right kidney consistent with Bosniak type 2 lesion. Follow-up ultrasound in 1 year is recommended to ensure stability.   01/20/2019 Imaging   MRI abdomen 01/20/19  IMPRESSION: 1. Findings are highly concerning for central tumor in the biliary tract at the confluence of the common hepatic duct, cystic duct and common bile ducts. Further clinical evaluation for potential cholangiocarcinoma is strongly recommended. 2. Mild ductal dilatation of the main pancreatic duct throughout the distal body and tail of the pancreas where there is also some associated side branch ectasia. This may suggest a pancreatic ductal stricture. No obstructing pancreatic neoplasm identified. 3. Aortic atherosclerosis.   01/21/2019 Initial Biopsy   Diagnosis 01/21/19  BILE DUCT BRUSHING (SPECIMEN 1 OF 1 COLLECTED 01/21/2019) ADENOCARCINOMA.   01/21/2019 Procedure   ERCP By Dr hung  01/21/19 IMRPESSION - The major papilla appeared normal. - A single localized biliary stricture was found in the middle third of the main bile duct. - The entire main bile duct and upper third of the main bile duct were dilated, secondary to a stricture. - A biliary sphincterotomy was performed. - Cells for cytology obtained in the middle third of the main bile duct. - One plastic stent was placed into the common bile duct.  EUS by Dr hung 01/21/19  IMPRESSION - There was dilation in the middle third of the main bile duct and in the upper third of the main bile duct which measured up to 15 mm. - There was a suggestion of a stricture in the middle third of the main bile duct. - No specimens collected.   02/03/2019 Imaging   CT CAP at [facility] 02/03/19  Impression:  1. There is mild intrahepatic and extrahepatic biliary ductal dilation and diffuse common bile duct wall thickening with stent in place. There is abnormal soft tissue measuring approximately 1.6 cm between the common hepatic artery, portal vein and common bile duct, worrisome for the known cholangiocarcinoma.  2. Periportal lymphadenopathy which may represent nodal metastasis.  4. Marked pancreatic atrophy and duct dilation involving the distal body and tail the pancreas where there is a coarse calcification. These findings are favored to represent stenosis from intraductal stones though an underlying stricture cannot be completely excluded.  5. Atypical symmetric prominent fat stranding of the bilateral lower abdominal wall. Correlate with surgical history or history of history of trauma.   03/08/2019 Initial Diagnosis   Cholangiocarcinoma ([place])   03/12/2019 -  Chemotherapy   Gemcitabine and cisplatin on days 1 and 8 every 21 days starting 03/12/19. Abraxane added with cycle 2. Added GCSF [name]) to  day 9 starting cycle 2. Chemo held since 06/03/19 due to poor toleration. Chemo restarted with Gem/Cis with C6 on 07/02/19.     03/19/2019 Cancer Staging   Staging form: Perihilar Bile Ducts, AJCC 8th Edition - Clinical: Stage Unknown (cTX, cN0, cM0) - Signed by [name], MD on 03/19/2019   05/24/2019 Imaging   CT CAP W Contrast at [facility]  1.  Interval placement of covered metallic biliary stent. No progressive dilation of the biliary tree and resulting resolution of the prior main pancreatic duct dilation. 2.  Evidence of some subtle vascular contour deformity involving the hepatic artery, suspected to be from tumoral contact and/or posttreatment changes. 3.  New small right pleural effusion with overlying mild airspace disease. Secondary findings which may indicate a component of aspiration. 4.  No convincing evidence of new disseminated metastatic disease. 5.  Additional findings as discussed above.   06/18/2019 Imaging   CT AP IMPRESSION: 1.  No acute findings in the abdomen/pelvis. 2. Biliary stent in adequate position. No evidence of focal mass or adenopathy in the region of the pancreatic head or porta hepatis. 3. Moderate size right pleural effusion with associated right basilar atelectasis. 4. Possible single gallstone. Stable subcentimeter liver cyst. Stable right renal cysts. Small left inguinal hernia containing only peritoneal fat. 5. Aortic Atherosclerosis (ICD10-I70.0).   07/28/2019 Imaging   CT CAP IMPRESSION: 1. The patient is status post common bile duct stent placement, with unchanged stent position and patent appearance with pneumobilia. Primary cholangiocarcinoma is not discretely appreciated by CT. 2. No direct evidence of metastatic disease in the chest, abdomen, or pelvis. 3. Moderate right pleural effusion with associated atelectasis or consolidation, slightly improved compared to prior examination. 4. The pancreatic parenchyma is atrophic and calcified, particularly in the pancreatic tail. 5.  Coronary artery disease.  Aortic atherosclerosis   10/20/2019 PET scan   IMPRESSION: 1.  Redemonstrated post treatment and post stenting appearance of a central cholangiocarcinoma, which is not directly appreciated by CT. Common bile duct stent remains in position with post stenting pneumobilia. 2. Slight interval increase in a moderate to large right pleural effusion with associated atelectasis or consolidation. There may be some pleural nodularity about the right hemidiaphragm which appears new compared to prior examination (series 2, image 40), highly suspicious for pleural metastatic disease. 3. No direct evidence of metastatic disease in the chest, abdomen, or pelvis. 4. Coronary artery disease.  Aortic Atherosclerosis (ICD10-I70.0).     10/29/2019 Pathology Results   Thoracentesis FINAL MICROSCOPIC DIAGNOSIS:  - Reactive mesothelial cells present    11/17/2019 PET scan   IMPRESSION: 1. Low-level hypermetabolism within the right pleural space, without focal abnormality to strongly suggest pleural metastasis. 2. Hypermetabolism along the course of the common duct stent, nonspecific and most likely reactive. 3. Otherwise, no evidence of hypermetabolic metastasis. 4. Small to moderate right pleural effusion with developing loculation superiorly. 5. Coronary artery atherosclerosis. Aortic Atherosclerosis (ICD10-I70.0). 6.  Possible constipation.        CURRENT THERAPY:  Gemcitabine and cisplatin on days 1 and 8 every 21 daysstarting 03/12/19. Abraxane added with cycle 2.AddedGCSF ([name])to day 9 startingcycle 2.   INTERVAL HISTORY:  ZIGMOND TRELA is here for a follow up. He presents to the clinic alone. He notes he feels better this week. He denies anymore fever after last visit. He took tylenol and antibiotics. He was able to complete antibiotics. He denies diarrhea. He notes his temp at home is rarely over 51F. He notes he is  interested in clinical trail.     REVIEW OF SYSTEMS:   Constitutional: Denies fevers, chills or abnormal weight loss Eyes:  Denies blurriness of vision Ears, nose, mouth, throat, and face: Denies mucositis or sore throat Respiratory: Denies cough, dyspnea or wheezes Cardiovascular: Denies palpitation, chest discomfort or lower extremity swelling Gastrointestinal:  Denies nausea, heartburn or change in bowel habits Skin: Denies abnormal skin rashes Lymphatics: Denies new lymphadenopathy or easy bruising Neurological:Denies numbness, tingling or new weaknesses Behavioral/Psych: Mood is stable, no new changes  All other systems were reviewed with the patient and are negative.  MEDICAL HISTORY:  Past Medical History:  Diagnosis Date  . Diabetes mellitus type I, controlled ([place])   . Dyslipidemia   . ED (erectile dysfunction)   . Essential hypertension   . Gilbert's disease   . Hepatitis B carrier [facility])     SURGICAL HISTORY: Past Surgical History:  Procedure Laterality Date  . APPENDECTOMY  1964  . BILIARY BRUSHING  01/21/2019   Procedure: BILIARY BRUSHING;  Surgeon: Carol Ada, MD;  Location: [facility]OSCOPY;  Service: Endoscopy;;  . BILIARY STENT PLACEMENT N/A 01/21/2019   Procedure: BILIARY STENT PLACEMENT;  Surgeon: Carol Ada, MD;  Location: [facility];  Service: Endoscopy;  Laterality: N/A;  . ENDOSCOPIC RETROGRADE CHOLANGIOPANCREATOGRAPHY (ERCP) WITH PROPOFOL N/A 01/21/2019   Procedure: ENDOSCOPIC RETROGRADE CHOLANGIOPANCREATOGRAPHY (ERCP) WITH PROPOFOL;  Surgeon: Carol Ada, MD;  Location: [facility];  Service: Endoscopy;  Laterality: N/A;  . ESOPHAGOGASTRODUODENOSCOPY (EGD) WITH PROPOFOL N/A 01/21/2019   Procedure: ESOPHAGOGASTRODUODENOSCOPY (EGD) WITH PROPOFOL;  Surgeon: Carol Ada, MD;  Location: [facility];  Service: Endoscopy;  Laterality: N/A;  . FEMORAL HERNIA REPAIR    . IR CHOLANGIOGRAM EXISTING TUBE  05/13/2019  . IR CHOLANGIOGRAM EXISTING TUBE  06/08/2019  . IR EXCHANGE BILIARY DRAIN  05/18/2019  . IR EXCHANGE BILIARY DRAIN  06/08/2019  . IR IMAGING GUIDED PORT INSERTION  03/08/2019   . IR PERC CHOLECYSTOSTOMY  05/04/2019  . IR THORACENTESIS ASP PLEURAL SPACE W/IMG GUIDE  10/29/2019  . LEFT HEART CATH AND CORONARY ANGIOGRAPHY N/A 05/19/2017   Procedure: LEFT HEART CATH AND CORONARY ANGIOGRAPHY;  Surgeon: Leonie Man, MD;  Location: [place] [facility] LAB;  Service: Cardiovascular;  Laterality: N/A;  . SPHINCTEROTOMY  01/21/2019   Procedure: SPHINCTEROTOMY;  Surgeon: Carol Ada, MD;  Location: [facility];  Service: Endoscopy;;  . UPPER ESOPHAGEAL ENDOSCOPIC ULTRASOUND (EUS) N/A 01/21/2019   Procedure: UPPER ESOPHAGEAL ENDOSCOPIC ULTRASOUND (EUS);  Surgeon: Carol Ada, MD;  Location: [facility];  Service: Endoscopy;  Laterality: N/A;    I have reviewed the social history and family history with the patient and they are unchanged from previous note.  ALLERGIES:  is allergic to erythromycin.  MEDICATIONS:  Current Outpatient Medications  Medication Sig Dispense Refill  . acetaminophen (TYLENOL) 500 MG tablet Take 1,000 mg by mouth 2 (two) times daily as needed for moderate pain or headache.    Marland Kitchen amLODipine (NORVASC) 5 MG tablet Take 5 mg by mouth daily.     Marland Kitchen atenolol (TENORMIN) 25 MG tablet Take 25 mg by mouth daily. Once a day     . atorvastatin (LIPITOR) 10 MG tablet Take 10 mg by mouth daily.    . Blood Glucose Monitoring Suppl (ACCU-CHEK NANO SMARTVIEW) W/DEVICE KIT 1 kit by Does not apply route 2 (two) times daily. (Patient taking differently: 1 kit by Does not apply route See admin instructions. Test blood sugars 12x's daily) 1 kit 0  . clonazePAM (KLONOPIN) 0.5 MG tablet     .  glucose blood test strip Test 3 times a day. (Patient taking differently: 1 each by Other route See admin instructions. Test 12 times a day.) 300 each Prn  . insulin NPH Human (NOVOLIN N) 100 UNIT/ML injection Inject 20 Units into the skin at bedtime.     . insulin regular (NOVOLIN R) 100 units/mL injection Inject 20 Units into the skin 3 (three) times daily before meals.     Marland Kitchen  levofloxacin (LEVAQUIN) 750 MG tablet Take 1 tablet (750 mg total) by mouth daily. 7 tablet 0  . lidocaine-prilocaine (EMLA) cream Apply to affected area once 30 g 3  . lisinopril (ZESTRIL) 20 MG tablet Take 20 mg by mouth daily.     Marland Kitchen LORazepam (ATIVAN) 0.5 MG tablet Take 0.5-1 tablets (0.25-0.5 mg total) by mouth every 8 (eight) hours as needed (nausea and vomiting). 30 tablet 0  . magnesium oxide (MAG-OX) 400 MG tablet TAKE 1 TABLET BY MOUTH EVERY DAY 90 tablet 1  . metFORMIN (GLUCOPHAGE) 1000 MG tablet Take 2,000 mg by mouth every evening.     . mirtazapine (REMERON) 7.5 MG tablet TAKE 1 TABLET (7.5 MG TOTAL) BY MOUTH AT BEDTIME. 90 tablet 1  . ondansetron (ZOFRAN) 8 MG tablet TAKE 1 TABLET BY MOUTH 2 TIMES DAY AS NEEDED. START ON 3RD DAY AFTER CHEMOTHERAPY 30 tablet 1  . oxycodone (OXY-IR) 5 MG capsule Take 1 capsule (5 mg total) by mouth every 6 (six) hours as needed for pain (severe pain). 20 capsule 0  . prochlorperazine (COMPAZINE) 10 MG tablet TAKE 1 TABLET (10 MG TOTAL) BY MOUTH EVERY 6 (SIX) HOURS AS NEEDED (NAUSEA OR VOMITING). 30 tablet 1  . zolpidem (AMBIEN) 5 MG tablet Take 1-2 tablets (5-10 mg total) by mouth at bedtime as needed for sleep. 60 tablet 0   No current facility-administered medications for this visit.   Facility-Administered Medications Ordered in Other Visits  Medication Dose Route Frequency Provider Last Rate Last Admin  . 0.9 %  sodium chloride infusion (Manually program via Guardrails IV Fluids)  250 mL Intravenous Once [name], MD      . gemcitabine (GEMZAR) 1,444 mg in sodium chloride 0.9 % 250 mL chemo infusion  800 mg/m2 (Treatment Plan Recorded) Intravenous Once [name], MD 576 mL/hr at 12/31/19 1201 1,444 mg at 12/31/19 1201  . heparin lock flush 100 unit/mL  500 Units Intracatheter Once PRN [name], MD      . sodium chloride flush (NS) 0.9 % injection 10 mL  10 mL Intracatheter PRN [name], MD        PHYSICAL EXAMINATION: ECOG PERFORMANCE  STATUS: 2 - Symptomatic, <50% confined to bed  Vitals:   12/31/19 1005  BP: 132/65  Pulse: 80  Resp: 20  Temp: 98.7 F (37.1 C)  SpO2: 100%   Filed Weights   12/31/19 1005  Weight: 144 lb 4.8 oz (65.5 kg)    Due to COVID19 we will limit examination to appearance. Patient had no complaints.  GENERAL:alert, no distress and comfortable SKIN: skin color normal, no rashes or significant lesions EYES: normal, Conjunctiva are pink and non-injected, sclera clear  NEURO: alert & oriented x 3 with fluent speech   LABORATORY DATA:  I have reviewed the data as listed CBC Latest Ref Rng & Units 12/31/2019 12/23/2019 12/20/2019  WBC 4.0 - 10.5 K/uL 6.9 7.7 5.1  Hemoglobin 13.0 - 17.0 g/dL 9.0(L) 9.6(L) 8.2(L)  Hematocrit 39.0 - 52.0 % 28.7(L) 29.0(L) 25.6(L)  Platelets 150 - 400  K/uL 268 218 314     CMP Latest Ref Rng & Units 12/31/2019 12/23/2019 12/20/2019  Glucose 70 - 99 mg/dL 232(H) 130(H) 81  BUN 8 - 23 mg/dL 19 17 21   Creatinine 0.61 - 1.24 mg/dL 1.51(H) 1.30(H) 1.16  Sodium 135 - 145 mmol/L 134(L) 136 136  Potassium 3.5 - 5.1 mmol/L 4.4 4.2 4.1  Chloride 98 - 111 mmol/L 101 100 101  CO2 22 - 32 mmol/L 24 26 27   Calcium 8.9 - 10.3 mg/dL 9.1 9.1 8.9  Total Protein 6.5 - 8.1 g/dL 7.0 7.2 6.7  Total Bilirubin 0.3 - 1.2 mg/dL 0.3 0.5 0.6  Alkaline Phos 38 - 126 U/L 59 76 57  AST 15 - 41 U/L 15 34 16  ALT 0 - 44 U/L 10 28 13       RADIOGRAPHIC STUDIES: I have personally reviewed the radiological images as listed and agreed with the findings in the report. No results found.   ASSESSMENT & PLAN:  NAIM MURTHA is a 74 y.o. male with    1. Extrahepatic cholangiocarcinoma, cTxN0M0 -He wasdiagnosedin 12/2018. His work up revealed malignant stricture and CBD obstruction required stenting. Brushing revealed adenocarcinoma. His extrahepatic cholangiocarcinoma was not resectable due to vascular invasion(surgeon - Dr. Zenia Resides at [place] started first line chemogemcitabine and  cisplatin on days 1 and 8 every 21 dayson6/12/20. Abraxane added with cycle 2.AddedGCSF ([name])to day 9 startingcycle 2.Abraxane stopped after C4 and chemo held9/3/20-10/2/10 due to poor toleration/hospitalization. He has improved with dose reduction. Cisplatin held starting with C12 due to decreased kidney function.  -He met with surgeon Dr. Carlis Abbott on 2/22 who does not recommend surgery at this time given his decreased performance status and technically difficult resection. Will continue with same chemotherapy regimen to control his disease.   -He is doing better this week after fever and body aches last week. No recurrent fever.  -Labs reviewed and adequate to proceed with C14D1 Gemcitabine, will hold on cisplatin due to elevated Cr  -F/u next week    2. Fever and Body aches, cough   -On 12/23/19 he woke up with new body aches and continued occasional cough with mild congestion which he has had lately. He also had nausea  -That day his check in temp was 56F but his temp in infusion room was 100.13F. He does have lowered breath sounds on the left on exam (12/23/19) secondary to pleural effusion, otherwise negative exam -He notes he had first COVID19 vaccine on 2/28 and second on 3/30. He denied any major side effects.  -He was recently treated with blood transfusion and IV Fluids.  -His 12/24/19 COVID test was negative. Blood and Urine culture form 12/23/19 overall negative.  -He feels better this week. He has no further fever after last visit. He took Tylenol and completed short course antibiotics.    3.History of acute cholecystitis -He washospitalizedon 05/03/19.  -He had a percutaneous gallbladder drainage tube placed by IR8/5/20.It was removed on 9/8 due to leakage  -Hiscurrent abdominal pain is probably related to the procedure, I encouraged him to use pain medication as needed -No clinical high concern for acute cholecystitis we will continue to monitoring clinically   4.Right-sided pleural effusion -He underwent right thoracentesis with 2.1 is removedon 06/23/2019, cytologynegative.The etiology is unclear, certainly malignancy is ahigh possibility. Hewas treated withcourse of antibiotics -His SOB has progressed lately and he has worsened right pleural effusion on 10/20/19 CT scan.  -He had a Thoracentesis on 10/29/19 with 1.3L removed. Cytology  showed reactive mesothelial cells, no malignancy. PET from 11/17/19 is not definitve for metastasis but still suspected, will monitor.   5. Weight lossand low appetite -secondary to malignancyand chemo, he presented with 20 lbs weight loss  -Continue to f/u with dietician -His appetitehas dropped significantly with recent cholecystitisand secondary to chemo due to nausea.  -I encouragedhim tocontinuenutritional supplement (carnation breakfast and Boost)to 3-5 bottles aday. -With chemo dose reduction (no Abraxane)he has been able to maintain weight. -To help stimulate his low appetite and help his sleep I recommend Mirtazapine 7.68m. He is willing to try (11/26/19)  6. DM -not well controlled -on insulin,managed by PCP Dr. CTheda Sers-Due to elevated BG secondary to chemo pre-meds, he has been needing to increase his insulin but working on controlling it.  7. CAD, HL, HTN -on amlodipine, atenolol, lisinopril, and statin. Continue tofollow up withcardiologist Dr. HEllyn Hack-controlled and stable  8. Mid abdominal pain  -Occurs about once a week -I encouraged him to make sure he has BM and avoid constipation with stool softeners.  -I also encouraged him to use antiacids to help with bloating.  -Will monitor for progression  9. Elevated Cr -his cr has been fluctuating lately -will give NS 1L for hydration today   PLAN: -Labs reviewed and adequate to proceed with C14D1 Gemcitabine, will hold off Cisplatin today due to his elevated Cr  -Lab, flush, f/u and chemo in 1, 3, 4 weeks  -F/u  in 1 and 3 weeks     No problem-specific Assessment & Plan notes found for this encounter.   No orders of the defined types were placed in this encounter.  All questions were answered. The patient knows to call the clinic with any problems, questions or concerns. No barriers to learning was detected. The total time spent in the appointment was 30 minutes.     YTruitt Merle MD 12/31/2019   I, AJoslyn Devon am acting as scribe for YTruitt Merle MD.   I have reviewed the above documentation for accuracy and completeness, and I agree with the above.

## 2019-12-31 ENCOUNTER — Inpatient Hospital Stay: Payer: Medicare Other

## 2019-12-31 ENCOUNTER — Other Ambulatory Visit: Payer: Self-pay

## 2019-12-31 ENCOUNTER — Inpatient Hospital Stay (HOSPITAL_BASED_OUTPATIENT_CLINIC_OR_DEPARTMENT_OTHER): Payer: Medicare Other | Admitting: Hematology

## 2019-12-31 ENCOUNTER — Telehealth: Payer: Self-pay | Admitting: Physician Assistant

## 2019-12-31 ENCOUNTER — Inpatient Hospital Stay: Payer: Medicare Other | Attending: Hematology

## 2019-12-31 ENCOUNTER — Encounter: Payer: Self-pay | Admitting: Hematology

## 2019-12-31 VITALS — BP 132/65 | HR 80 | Temp 98.7°F | Resp 20 | Ht 67.0 in | Wt 144.3 lb

## 2019-12-31 DIAGNOSIS — C221 Intrahepatic bile duct carcinoma: Secondary | ICD-10-CM

## 2019-12-31 DIAGNOSIS — I10 Essential (primary) hypertension: Secondary | ICD-10-CM | POA: Insufficient documentation

## 2019-12-31 DIAGNOSIS — R944 Abnormal results of kidney function studies: Secondary | ICD-10-CM | POA: Diagnosis not present

## 2019-12-31 DIAGNOSIS — E1140 Type 2 diabetes mellitus with diabetic neuropathy, unspecified: Secondary | ICD-10-CM | POA: Diagnosis not present

## 2019-12-31 DIAGNOSIS — J90 Pleural effusion, not elsewhere classified: Secondary | ICD-10-CM | POA: Insufficient documentation

## 2019-12-31 DIAGNOSIS — M546 Pain in thoracic spine: Secondary | ICD-10-CM | POA: Diagnosis not present

## 2019-12-31 DIAGNOSIS — E1165 Type 2 diabetes mellitus with hyperglycemia: Secondary | ICD-10-CM | POA: Insufficient documentation

## 2019-12-31 DIAGNOSIS — I2510 Atherosclerotic heart disease of native coronary artery without angina pectoris: Secondary | ICD-10-CM | POA: Diagnosis not present

## 2019-12-31 DIAGNOSIS — Z794 Long term (current) use of insulin: Secondary | ICD-10-CM | POA: Insufficient documentation

## 2019-12-31 DIAGNOSIS — C240 Malignant neoplasm of extrahepatic bile duct: Secondary | ICD-10-CM | POA: Diagnosis not present

## 2019-12-31 DIAGNOSIS — Z5111 Encounter for antineoplastic chemotherapy: Secondary | ICD-10-CM | POA: Insufficient documentation

## 2019-12-31 LAB — CMP (CANCER CENTER ONLY)
ALT: 10 U/L (ref 0–44)
AST: 15 U/L (ref 15–41)
Albumin: 3.1 g/dL — ABNORMAL LOW (ref 3.5–5.0)
Alkaline Phosphatase: 59 U/L (ref 38–126)
Anion gap: 9 (ref 5–15)
BUN: 19 mg/dL (ref 8–23)
CO2: 24 mmol/L (ref 22–32)
Calcium: 9.1 mg/dL (ref 8.9–10.3)
Chloride: 101 mmol/L (ref 98–111)
Creatinine: 1.51 mg/dL — ABNORMAL HIGH (ref 0.61–1.24)
GFR, Est AFR Am: 53 mL/min — ABNORMAL LOW (ref >60)
GFR, Estimated: 45 mL/min — ABNORMAL LOW (ref >60)
Glucose, Bld: 232 mg/dL — ABNORMAL HIGH (ref 70–99)
Potassium: 4.4 mmol/L (ref 3.5–5.1)
Sodium: 134 mmol/L — ABNORMAL LOW (ref 135–145)
Total Bilirubin: 0.3 mg/dL (ref 0.3–1.2)
Total Protein: 7 g/dL (ref 6.5–8.1)

## 2019-12-31 LAB — CBC WITH DIFFERENTIAL (CANCER CENTER ONLY)
Abs Immature Granulocytes: 0.02 10*3/uL (ref 0.00–0.07)
Basophils Absolute: 0 10*3/uL (ref 0.0–0.1)
Basophils Relative: 0 %
Eosinophils Absolute: 0.1 10*3/uL (ref 0.0–0.5)
Eosinophils Relative: 1 %
HCT: 28.7 % — ABNORMAL LOW (ref 39.0–52.0)
Hemoglobin: 9 g/dL — ABNORMAL LOW (ref 13.0–17.0)
Immature Granulocytes: 0 %
Lymphocytes Relative: 29 %
Lymphs Abs: 2 10*3/uL (ref 0.7–4.0)
MCH: 28.6 pg (ref 26.0–34.0)
MCHC: 31.4 g/dL (ref 30.0–36.0)
MCV: 91.1 fL (ref 80.0–100.0)
Monocytes Absolute: 1.6 10*3/uL — ABNORMAL HIGH (ref 0.1–1.0)
Monocytes Relative: 23 %
Neutro Abs: 3.2 10*3/uL (ref 1.7–7.7)
Neutrophils Relative %: 47 %
Platelet Count: 268 10*3/uL (ref 150–400)
RBC: 3.15 MIL/uL — ABNORMAL LOW (ref 4.22–5.81)
RDW: 15.6 % — ABNORMAL HIGH (ref 11.5–15.5)
WBC Count: 6.9 10*3/uL (ref 4.0–10.5)
nRBC: 0 % (ref 0.0–0.2)

## 2019-12-31 LAB — MAGNESIUM: Magnesium: 1.9 mg/dL (ref 1.7–2.4)

## 2019-12-31 MED ORDER — SODIUM CHLORIDE 0.9 % IV SOLN
Freq: Once | INTRAVENOUS | Status: AC
  Filled 2019-12-31: qty 250

## 2019-12-31 MED ORDER — HEPARIN SOD (PORK) LOCK FLUSH 100 UNIT/ML IV SOLN
500.0000 [IU] | Freq: Once | INTRAVENOUS | Status: AC | PRN
  Administered 2019-12-31: 500 [IU]
  Filled 2019-12-31: qty 5

## 2019-12-31 MED ORDER — PROCHLORPERAZINE MALEATE 10 MG PO TABS
10.0000 mg | ORAL_TABLET | Freq: Once | ORAL | Status: AC
  Administered 2019-12-31: 10 mg via ORAL

## 2019-12-31 MED ORDER — SODIUM CHLORIDE 0.9 % IV SOLN
800.0000 mg/m2 | Freq: Once | INTRAVENOUS | Status: AC
  Administered 2019-12-31: 1444 mg via INTRAVENOUS
  Filled 2019-12-31: qty 37.98

## 2019-12-31 MED ORDER — PROCHLORPERAZINE MALEATE 10 MG PO TABS
ORAL_TABLET | ORAL | Status: AC
  Filled 2019-12-31: qty 1

## 2019-12-31 MED ORDER — SODIUM CHLORIDE 0.9% FLUSH
10.0000 mL | INTRAVENOUS | Status: DC | PRN
  Administered 2019-12-31: 10 mL
  Filled 2019-12-31: qty 10

## 2019-12-31 NOTE — Progress Notes (Signed)
SCr = 1.51 today (CrCl = 40 mL/min) Dr. Burr Medico requested no Cisplatin today.  Gemzar only. IV hydration will change to NS 1 L over 2 hrs. Premeds adjusted - Compazine today.  MD will monitor for changes in SCr and +/- Cisplatin as indicated/tolerated.  Kennith Center, Pharm.D., CPP 12/31/2019@10 :52 AM

## 2019-12-31 NOTE — Telephone Encounter (Signed)
Scheduled per 04/02 los, patient has been called and voicemail was left.

## 2019-12-31 NOTE — Patient Instructions (Signed)
[facility] Discharge Instructions for Patients Receiving Chemotherapy  Today you received the following chemotherapy agents:  Gemcitibine (Gemzar)  To help prevent nausea and vomiting after your treatment, we encourage you to take your nausea medication as prescribed.   If you develop nausea and vomiting that is not controlled by your nausea medication, call the clinic.   BELOW ARE SYMPTOMS THAT SHOULD BE REPORTED IMMEDIATELY:  *FEVER GREATER THAN 100.5 F  *CHILLS WITH OR WITHOUT FEVER  NAUSEA AND VOMITING THAT IS NOT CONTROLLED WITH YOUR NAUSEA MEDICATION  *UNUSUAL SHORTNESS OF BREATH  *UNUSUAL BRUISING OR BLEEDING  TENDERNESS IN MOUTH AND THROAT WITH OR WITHOUT PRESENCE OF ULCERS  *URINARY PROBLEMS  *BOWEL PROBLEMS  UNUSUAL RASH Items with * indicate a potential emergency and should be followed up as soon as possible.  Feel free to call the clinic should you have any questions or concerns. The clinic phone number is [phone].  Please show the [place] at check-in to the Emergency Department and triage nurse.

## 2020-01-03 NOTE — Progress Notes (Signed)
[place]   [phone] Fax:([phone]   Clinic Follow up Note   Patient Care Team: [name], DO as PCP - General (Family Medicine)  Date of Service:  01/06/2020  CHIEF COMPLAINT: F/u of ExtrahepaticCholangiocarcinoma  SUMMARY OF ONCOLOGIC HISTORY: Oncology History  Cholangiocarcinoma ([place])  01/14/2019 Imaging   US Abdomen 01/14/19  IMPRESSION: There is significant intrahepatic and extrahepatic biliary dilatation present, concerning for distal common bile duct obstruction. Further evaluation with CT scan or MRCP is recommended. Correlation with liver function tests is recommended as well. These results will be called to the ordering clinician or representative by the Radiologist Assistant, and communication documented in the PACS or zVision Dashboard.   Probable large amount of sludge seen within gallbladder lumen with mild gallbladder wall thickening. However, no cholelithiasis, pericholecystic fluid or sonographic Murphy's sign is noted.   4.2 cm septated cyst seen in upper pole of right kidney consistent with Bosniak type 2 lesion. Follow-up ultrasound in 1 year is recommended to ensure stability.   01/20/2019 Imaging   MRI abdomen 01/20/19  IMPRESSION: 1. Findings are highly concerning for central tumor in the biliary tract at the confluence of the common hepatic duct, cystic duct and common bile ducts. Further clinical evaluation for potential cholangiocarcinoma is strongly recommended. 2. Mild ductal dilatation of the main pancreatic duct throughout the distal body and tail of the pancreas where there is also some associated side branch ectasia. This may suggest a pancreatic ductal stricture. No obstructing pancreatic neoplasm identified. 3. Aortic atherosclerosis.   01/21/2019 Initial Biopsy   Diagnosis 01/21/19  BILE DUCT BRUSHING (SPECIMEN 1 OF 1 COLLECTED 01/21/2019) ADENOCARCINOMA.   01/21/2019 Procedure   ERCP By Dr hung  01/21/19 IMRPESSION - The major papilla appeared normal. - A single localized biliary stricture was found in the middle third of the main bile duct. - The entire main bile duct and upper third of the main bile duct were dilated, secondary to a stricture. - A biliary sphincterotomy was performed. - Cells for cytology obtained in the middle third of the main bile duct. - One plastic stent was placed into the common bile duct.  EUS by Dr hung 01/21/19  IMPRESSION - There was dilation in the middle third of the main bile duct and in the upper third of the main bile duct which measured up to 15 mm. - There was a suggestion of a stricture in the middle third of the main bile duct. - No specimens collected.   02/03/2019 Imaging   CT CAP at [facility] 02/03/19  Impression:  1. There is mild intrahepatic and extrahepatic biliary ductal dilation and diffuse common bile duct wall thickening with stent in place. There is abnormal soft tissue measuring approximately 1.6 cm between the common hepatic artery, portal vein and common bile duct, worrisome for the known cholangiocarcinoma.  2. Periportal lymphadenopathy which may represent nodal metastasis.  4. Marked pancreatic atrophy and duct dilation involving the distal body and tail the pancreas where there is a coarse calcification. These findings are favored to represent stenosis from intraductal stones though an underlying stricture cannot be completely excluded.  5. Atypical symmetric prominent fat stranding of the bilateral lower abdominal wall. Correlate with surgical history or history of history of trauma.   03/08/2019 Initial Diagnosis   Cholangiocarcinoma ([place])   03/12/2019 -  Chemotherapy   Gemcitabine and cisplatin on days 1 and 8 every 21 days starting 03/12/19. Abraxane added with cycle 2. Added GCSF [name]) to  day 9 starting cycle 2. Chemo held since 06/03/19 due to poor toleration. Chemo restarted with Gem/Cis with C6 on 07/02/19.     03/19/2019 Cancer Staging   Staging form: Perihilar Bile Ducts, AJCC 8th Edition - Clinical: Stage Unknown (cTX, cN0, cM0) - Signed by [name], MD on 03/19/2019   05/24/2019 Imaging   CT CAP W Contrast at [facility]  1.  Interval placement of covered metallic biliary stent. No progressive dilation of the biliary tree and resulting resolution of the prior main pancreatic duct dilation. 2.  Evidence of some subtle vascular contour deformity involving the hepatic artery, suspected to be from tumoral contact and/or posttreatment changes. 3.  New small right pleural effusion with overlying mild airspace disease. Secondary findings which may indicate a component of aspiration. 4.  No convincing evidence of new disseminated metastatic disease. 5.  Additional findings as discussed above.   06/18/2019 Imaging   CT AP IMPRESSION: 1.  No acute findings in the abdomen/pelvis. 2. Biliary stent in adequate position. No evidence of focal mass or adenopathy in the region of the pancreatic head or porta hepatis. 3. Moderate size right pleural effusion with associated right basilar atelectasis. 4. Possible single gallstone. Stable subcentimeter liver cyst. Stable right renal cysts. Small left inguinal hernia containing only peritoneal fat. 5. Aortic Atherosclerosis (ICD10-I70.0).   07/28/2019 Imaging   CT CAP IMPRESSION: 1. The patient is status post common bile duct stent placement, with unchanged stent position and patent appearance with pneumobilia. Primary cholangiocarcinoma is not discretely appreciated by CT. 2. No direct evidence of metastatic disease in the chest, abdomen, or pelvis. 3. Moderate right pleural effusion with associated atelectasis or consolidation, slightly improved compared to prior examination. 4. The pancreatic parenchyma is atrophic and calcified, particularly in the pancreatic tail. 5.  Coronary artery disease.  Aortic atherosclerosis   10/20/2019 PET scan   IMPRESSION: 1.  Redemonstrated post treatment and post stenting appearance of a central cholangiocarcinoma, which is not directly appreciated by CT. Common bile duct stent remains in position with post stenting pneumobilia. 2. Slight interval increase in a moderate to large right pleural effusion with associated atelectasis or consolidation. There may be some pleural nodularity about the right hemidiaphragm which appears new compared to prior examination (series 2, image 40), highly suspicious for pleural metastatic disease. 3. No direct evidence of metastatic disease in the chest, abdomen, or pelvis. 4. Coronary artery disease.  Aortic Atherosclerosis (ICD10-I70.0).     10/29/2019 Pathology Results   Thoracentesis FINAL MICROSCOPIC DIAGNOSIS:  - Reactive mesothelial cells present    11/17/2019 PET scan   IMPRESSION: 1. Low-level hypermetabolism within the right pleural space, without focal abnormality to strongly suggest pleural metastasis. 2. Hypermetabolism along the course of the common duct stent, nonspecific and most likely reactive. 3. Otherwise, no evidence of hypermetabolic metastasis. 4. Small to moderate right pleural effusion with developing loculation superiorly. 5. Coronary artery atherosclerosis. Aortic Atherosclerosis (ICD10-I70.0). 6.  Possible constipation.        CURRENT THERAPY:  Gemcitabine and cisplatin on days 1 and 8 every 21 daysstarting 03/12/19. Abraxane added with cycle 2.AddedGCSF ([name])to day 9 startingcycle 2.  INTERVAL HISTORY:  Christopher Welch is here for a follow up and treatment. He presents to the clinic alone. He notes he is doing better. He has been having mid abdominal and new squeezing back pain which he has had intermittently for a week. This may wake him up at night and pain improves when laying down. His back pain  can be 8/10 which is worse when he is up and moving. He notes he has been doing his own home chores and yard work as needed. He notes  he overall has better energy compared to recently. He notes in one of his imaging showed something about his pancreas that he wanted to follow up on. He notes when in clinic his BG is harder to manage and after treatment it will be higher which he has to manage. He notes his last A1c was 5.5 which his endocrinologist said was related to his anemia so his true level is unknown currently.     REVIEW OF SYSTEMS:   Constitutional: Denies fevers, chills or abnormal weight loss Eyes: Denies blurriness of vision Ears, nose, mouth, throat, and face: Denies mucositis or sore throat Respiratory: Denies cough, dyspnea or wheezes Cardiovascular: Denies palpitation, chest discomfort or lower extremity swelling Gastrointestinal:  Denies nausea, heartburn or change in bowel habits (+) Mid to lower abdominal pain  Skin: Denies abnormal skin rashes MSK: (+) Upper back pain  Lymphatics: Denies new lymphadenopathy or easy bruising Neurological:Denies numbness, tingling or new weaknesses Behavioral/Psych: Mood is stable, no new changes  All other systems were reviewed with the patient and are negative.  MEDICAL HISTORY:  Past Medical History:  Diagnosis Date  . Diabetes mellitus type I, controlled ([place])   . Dyslipidemia   . ED (erectile dysfunction)   . Essential hypertension   . Gilbert's disease   . Hepatitis B carrier [facility])     SURGICAL HISTORY: Past Surgical History:  Procedure Laterality Date  . APPENDECTOMY  1964  . BILIARY BRUSHING  01/21/2019   Procedure: BILIARY BRUSHING;  Surgeon: Carol Ada, MD;  Location: [facility];  Service: Endoscopy;;  . BILIARY STENT PLACEMENT N/A 01/21/2019   Procedure: BILIARY STENT PLACEMENT;  Surgeon: Carol Ada, MD;  Location: [facility];  Service: Endoscopy;  Laterality: N/A;  . ENDOSCOPIC RETROGRADE CHOLANGIOPANCREATOGRAPHY (ERCP) WITH PROPOFOL N/A 01/21/2019   Procedure: ENDOSCOPIC RETROGRADE CHOLANGIOPANCREATOGRAPHY (ERCP) WITH PROPOFOL;  Surgeon:  Carol Ada, MD;  Location: [facility];  Service: Endoscopy;  Laterality: N/A;  . ESOPHAGOGASTRODUODENOSCOPY (EGD) WITH PROPOFOL N/A 01/21/2019   Procedure: ESOPHAGOGASTRODUODENOSCOPY (EGD) WITH PROPOFOL;  Surgeon: Carol Ada, MD;  Location: [facility];  Service: Endoscopy;  Laterality: N/A;  . FEMORAL HERNIA REPAIR    . IR CHOLANGIOGRAM EXISTING TUBE  05/13/2019  . IR CHOLANGIOGRAM EXISTING TUBE  06/08/2019  . IR EXCHANGE BILIARY DRAIN  05/18/2019  . IR EXCHANGE BILIARY DRAIN  06/08/2019  . IR IMAGING GUIDED PORT INSERTION  03/08/2019  . IR PERC CHOLECYSTOSTOMY  05/04/2019  . IR THORACENTESIS ASP PLEURAL SPACE W/IMG GUIDE  10/29/2019  . LEFT HEART CATH AND CORONARY ANGIOGRAPHY N/A 05/19/2017   Procedure: LEFT HEART CATH AND CORONARY ANGIOGRAPHY;  Surgeon: Leonie Man, MD;  Location: [facility];  Service: Cardiovascular;  Laterality: N/A;  . SPHINCTEROTOMY  01/21/2019   Procedure: SPHINCTEROTOMY;  Surgeon: Carol Ada, MD;  Location: [facility];  Service: Endoscopy;;  . UPPER ESOPHAGEAL ENDOSCOPIC ULTRASOUND (EUS) N/A 01/21/2019   Procedure: UPPER ESOPHAGEAL ENDOSCOPIC ULTRASOUND (EUS);  Surgeon: Carol Ada, MD;  Location: [facility];  Service: Endoscopy;  Laterality: N/A;    I have reviewed the social history and family history with the patient and they are unchanged from previous note.  ALLERGIES:  is allergic to erythromycin.  MEDICATIONS:  Current Outpatient Medications  Medication Sig Dispense Refill  . acetaminophen (TYLENOL) 500 MG tablet Take 1,000 mg by mouth 2 (two)  times daily as needed for moderate pain or headache.    Marland Kitchen amLODipine (NORVASC) 5 MG tablet Take 5 mg by mouth daily.     Marland Kitchen atenolol (TENORMIN) 25 MG tablet Take 25 mg by mouth daily. Once a day     . atorvastatin (LIPITOR) 10 MG tablet Take 10 mg by mouth daily.    . Blood Glucose Monitoring Suppl (ACCU-CHEK NANO SMARTVIEW) W/DEVICE KIT 1 kit by Does not apply route 2 (two) times daily. (Patient  taking differently: 1 kit by Does not apply route See admin instructions. Test blood sugars 12x's daily) 1 kit 0  . clonazePAM (KLONOPIN) 0.5 MG tablet     . glucose blood test strip Test 3 times a day. (Patient taking differently: 1 each by Other route See admin instructions. Test 12 times a day.) 300 each Prn  . insulin NPH Human (NOVOLIN N) 100 UNIT/ML injection Inject 20 Units into the skin at bedtime.     . insulin regular (NOVOLIN R) 100 units/mL injection Inject 20 Units into the skin 3 (three) times daily before meals.     Marland Kitchen levofloxacin (LEVAQUIN) 750 MG tablet Take 1 tablet (750 mg total) by mouth daily. 7 tablet 0  . lidocaine-prilocaine (EMLA) cream Apply to affected area once 30 g 3  . lisinopril (ZESTRIL) 20 MG tablet Take 20 mg by mouth daily.     Marland Kitchen LORazepam (ATIVAN) 0.5 MG tablet Take 0.5-1 tablets (0.25-0.5 mg total) by mouth every 8 (eight) hours as needed (nausea and vomiting). 30 tablet 0  . magnesium oxide (MAG-OX) 400 MG tablet TAKE 1 TABLET BY MOUTH EVERY DAY 90 tablet 1  . metFORMIN (GLUCOPHAGE) 1000 MG tablet Take 2,000 mg by mouth every evening.     . mirtazapine (REMERON) 7.5 MG tablet TAKE 1 TABLET (7.5 MG TOTAL) BY MOUTH AT BEDTIME. 90 tablet 1  . ondansetron (ZOFRAN) 8 MG tablet TAKE 1 TABLET BY MOUTH 2 TIMES DAY AS NEEDED. START ON 3RD DAY AFTER CHEMOTHERAPY 30 tablet 1  . oxycodone (OXY-IR) 5 MG capsule Take 1 capsule (5 mg total) by mouth every 6 (six) hours as needed for pain (severe pain). 20 capsule 0  . prochlorperazine (COMPAZINE) 10 MG tablet TAKE 1 TABLET (10 MG TOTAL) BY MOUTH EVERY 6 (SIX) HOURS AS NEEDED (NAUSEA OR VOMITING). 30 tablet 1  . zolpidem (AMBIEN) 5 MG tablet Take 1-2 tablets (5-10 mg total) by mouth at bedtime as needed for sleep. 60 tablet 0   No current facility-administered medications for this visit.   Facility-Administered Medications Ordered in Other Visits  Medication Dose Route Frequency Provider Last Rate Last Admin  . 0.9 %   sodium chloride infusion (Manually program via Guardrails IV Fluids)  250 mL Intravenous Once [name], MD      . heparin lock flush 100 unit/mL  500 Units Intracatheter Once PRN [name], MD      . sodium chloride flush (NS) 0.9 % injection 10 mL  10 mL Intracatheter PRN [name], MD        PHYSICAL EXAMINATION: ECOG PERFORMANCE STATUS: 2 - Symptomatic, <50% confined to bed  Vitals:   01/06/20 0841  BP: 118/67  Pulse: 74  Resp: 18  Temp: 97.6 F (36.4 C)  SpO2: 100%   Filed Weights   01/06/20 0841  Weight: 144 lb (65.3 kg)    GENERAL:alert, no distress and comfortable SKIN: skin color, texture, turgor are normal, no rashes or significant lesions EYES: normal, Conjunctiva are pink  and non-injected, sclera clear  NECK: supple, thyroid normal size, non-tender, without nodularity LYMPH:  no palpable lymphadenopathy in the cervical, axillary  LUNGS: clear percussion (+) Right lung rales sounds HEART: regular rate & rhythm and no murmurs and no lower extremity edema ABDOMEN:abdomen soft, non-tender and normal bowel sounds Musculoskeletal:no cyanosis of digits and no clubbing (+) Tenderness of upper mid spine NEURO: alert & oriented x 3 with fluent speech, no focal motor/sensory deficits  LABORATORY DATA:  I have reviewed the data as listed CBC Latest Ref Rng & Units 01/06/2020 12/31/2019 12/23/2019  WBC 4.0 - 10.5 K/uL 3.7(L) 6.9 7.7  Hemoglobin 13.0 - 17.0 g/dL 8.2(L) 9.0(L) 9.6(L)  Hematocrit 39.0 - 52.0 % 25.3(L) 28.7(L) 29.0(L)  Platelets 150 - 400 K/uL 255 268 218     CMP Latest Ref Rng & Units 01/06/2020 12/31/2019 12/23/2019  Glucose 70 - 99 mg/dL 106(H) 232(H) 130(H)  BUN 8 - 23 mg/dL _0 Creatinine 0.61 - 1.24 mg/dL 1.54(H) 1.51(H) 1.30(H)  Sodium 135 - 145 mmol/L 135 134(L) 136  Potassium 3.5 - 5.1 mmol/L 4.4 4.4 4.2  Chloride 98 - 111 mmol/L 102 101 100  CO2 22 - 32 mmol/L _1 Calcium 8.9 - 10.3 mg/dL 8.8(L) 9.1 9.1  Total Protein 6.5 - 8.1 g/dL 7.0 7.0  7.2  Total Bilirubin 0.3 - 1.2 mg/dL 0.2(L) 0.3 0.5  Alkaline Phos 38 - 126 U/L 49 59 76  AST 15 - 41 U/L 18 15 34  ALT 0 - 44 U/L _2 RADIOGRAPHIC STUDIES: I have personally reviewed the radiological images as listed and agreed with the findings in the report. No results found.   ASSESSMENT & PLAN:  SENAY SISTRUNK is a 74 y.o. male with   1. Extrahepatic cholangiocarcinoma, cTxN0M0 -He wasdiagnosedin 12/2018. His work up revealed malignant stricture and CBD obstruction required stenting. Brushing revealed adenocarcinoma. His extrahepatic cholangiocarcinoma was not resectable due to vascular invasion(surgeon - Dr. Zenia Resides at [place] started first line chemogemcitabine and cisplatin on days 1 and 8 every 21 dayson6/12/20. Abraxane added with cycle 2.AddedGCSF ([name])to day 9 startingcycle 2.Abraxane stopped after C4 and chemo held9/3/20-10/2/10 due to poor toleration/hospitalization. He has improved with dose reduction.Cisplatin held starting with C12 due to decreased kidney function. -He met with surgeon Dr. Carlis Abbott on 11/22/19 who does not recommend surgery at this time given his decreased performance status and technically difficult resection. Will continue with same chemotherapy regimen to control his disease. -He continues to feel better and improving energy lately. Cisplatin was held last week (C14D1) given elevated Cr. Labs reviewed, WBC 3.7, Hg 8.2, ANC 1.4, Cr 1.54. Overall adequate to proceed with C14D8 Gem alone today.  -due to his persistent renal issue, will cancel cisplatin and continue gemcitabine alone  -Plan to repeat staging scan after C15.  -F/u in 2 weeks before C15   2.History of acute cholecystitis -He washospitalizedon 05/03/19.  -He had a percutaneous gallbladder drainage tube placed by IR8/5/20.It was removed on 9/8 due to leakage  -Hiscurrent abdominal pain is probably related to the procedure, I encouraged him to use pain  medication as needed -No clinical high concern for acute cholecystitis we will continue to monitoring clinically   3.Right-sided pleural effusion -He underwent right thoracentesis with 2.1 is removedon 06/23/2019, cytologynegative.The etiology is unclear, certainly malignancy is ahigh possibility. Hewas treated withcourse of antibiotics -His SOB has progressed lately and he has worsened right pleural effusion on 10/20/19  CT scan. -He had a Thoracentesis on 10/29/19 with 1.3L removed. Cytology showed reactive mesothelial cells, no malignancy.PET from 11/17/19 is not definitve for metastasis but still suspected, will monitor.  4. Weight lossand low appetite -secondary to malignancyand chemo, he presented with 20 lbs weight loss  -Continue to f/u with dietician -His appetitehas dropped significantly with recent cholecystitisand secondary to chemo due to nausea.  -I encouragedhim tocontinuenutritional supplement (carnation breakfast and Boost)to 3-5 bottles aday. -With chemo dose reduction (no Abraxane)he has been able to maintain weight. -He will continue Mirtazapine, will increase to 31m HS  5. DM -not well controlled -on insulin,managed by PCP Dr. CTheda Sers-Due to elevated BG secondary to chemo pre-meds, he has been needing to increase his insulin but working on controlling it.  6. CAD, HL, HTN -on amlodipine, atenolol, lisinopril, and statin. Continue tofollow up withcardiologist Dr. HEllyn Hack-controlled and stable  7. Mid abdominal pain and upper back pain -intermittent, back pain is new in the past week  -I encouraged him to use heating pad on back  -he does not want to have X-ray of his spine for now  -Will monitor for progression.   8. Elevated Cr -his cr has been fluctuating lately -Cisplatin held with last cycle (C14D1) -Cr at 1.54 today, will give 1L NS     PLAN: -Labs reviewed and adequate to proceed with C14D8 chemo with gemcitabine  alone -NS 1L over 2 hrs for hydration  -Lab, flush, f/u and chemo in 2, 3 weeks  -F/u in 2 weeks, will order restaging scan next visit    No problem-specific Assessment & Plan notes found for this encounter.   No orders of the defined types were placed in this encounter.  All questions were answered. The patient knows to call the clinic with any problems, questions or concerns. No barriers to learning was detected. The total time spent in the appointment was 30 minutes.     YTruitt Merle MD 01/06/2020   I, AJoslyn Devon am acting as scribe for YTruitt Merle MD.   I have reviewed the above documentation for accuracy and completeness, and I agree with the above.

## 2020-01-06 ENCOUNTER — Inpatient Hospital Stay: Payer: Medicare Other

## 2020-01-06 ENCOUNTER — Inpatient Hospital Stay (HOSPITAL_BASED_OUTPATIENT_CLINIC_OR_DEPARTMENT_OTHER): Payer: Medicare Other | Admitting: Hematology

## 2020-01-06 ENCOUNTER — Encounter: Payer: Self-pay | Admitting: Hematology

## 2020-01-06 ENCOUNTER — Other Ambulatory Visit: Payer: Self-pay

## 2020-01-06 VITALS — BP 118/67 | HR 74 | Temp 97.6°F | Resp 18 | Ht 67.0 in | Wt 144.0 lb

## 2020-01-06 DIAGNOSIS — Z95828 Presence of other vascular implants and grafts: Secondary | ICD-10-CM

## 2020-01-06 DIAGNOSIS — C221 Intrahepatic bile duct carcinoma: Secondary | ICD-10-CM

## 2020-01-06 DIAGNOSIS — E1140 Type 2 diabetes mellitus with diabetic neuropathy, unspecified: Secondary | ICD-10-CM

## 2020-01-06 DIAGNOSIS — I2510 Atherosclerotic heart disease of native coronary artery without angina pectoris: Secondary | ICD-10-CM | POA: Diagnosis not present

## 2020-01-06 DIAGNOSIS — E1165 Type 2 diabetes mellitus with hyperglycemia: Secondary | ICD-10-CM | POA: Diagnosis not present

## 2020-01-06 DIAGNOSIS — Z5111 Encounter for antineoplastic chemotherapy: Secondary | ICD-10-CM | POA: Diagnosis not present

## 2020-01-06 DIAGNOSIS — C240 Malignant neoplasm of extrahepatic bile duct: Secondary | ICD-10-CM | POA: Diagnosis not present

## 2020-01-06 DIAGNOSIS — Z794 Long term (current) use of insulin: Secondary | ICD-10-CM | POA: Diagnosis not present

## 2020-01-06 DIAGNOSIS — I10 Essential (primary) hypertension: Secondary | ICD-10-CM | POA: Diagnosis not present

## 2020-01-06 DIAGNOSIS — J90 Pleural effusion, not elsewhere classified: Secondary | ICD-10-CM | POA: Diagnosis not present

## 2020-01-06 LAB — CBC WITH DIFFERENTIAL (CANCER CENTER ONLY)
Abs Immature Granulocytes: 0.03 10*3/uL (ref 0.00–0.07)
Basophils Absolute: 0 10*3/uL (ref 0.0–0.1)
Basophils Relative: 1 %
Eosinophils Absolute: 0.1 10*3/uL (ref 0.0–0.5)
Eosinophils Relative: 1 %
HCT: 25.3 % — ABNORMAL LOW (ref 39.0–52.0)
Hemoglobin: 8.2 g/dL — ABNORMAL LOW (ref 13.0–17.0)
Immature Granulocytes: 1 %
Lymphocytes Relative: 43 %
Lymphs Abs: 1.7 10*3/uL (ref 0.7–4.0)
MCH: 29.3 pg (ref 26.0–34.0)
MCHC: 32.4 g/dL (ref 30.0–36.0)
MCV: 90.4 fL (ref 80.0–100.0)
Monocytes Absolute: 0.5 10*3/uL (ref 0.1–1.0)
Monocytes Relative: 15 %
Neutro Abs: 1.4 10*3/uL — ABNORMAL LOW (ref 1.7–7.7)
Neutrophils Relative %: 39 %
Platelet Count: 255 10*3/uL (ref 150–400)
RBC: 2.8 MIL/uL — ABNORMAL LOW (ref 4.22–5.81)
RDW: 15.7 % — ABNORMAL HIGH (ref 11.5–15.5)
WBC Count: 3.7 10*3/uL — ABNORMAL LOW (ref 4.0–10.5)
nRBC: 0 % (ref 0.0–0.2)

## 2020-01-06 LAB — CMP (CANCER CENTER ONLY)
ALT: 12 U/L (ref 0–44)
AST: 18 U/L (ref 15–41)
Albumin: 3 g/dL — ABNORMAL LOW (ref 3.5–5.0)
Alkaline Phosphatase: 49 U/L (ref 38–126)
Anion gap: 8 (ref 5–15)
BUN: 19 mg/dL (ref 8–23)
CO2: 25 mmol/L (ref 22–32)
Calcium: 8.8 mg/dL — ABNORMAL LOW (ref 8.9–10.3)
Chloride: 102 mmol/L (ref 98–111)
Creatinine: 1.54 mg/dL — ABNORMAL HIGH (ref 0.61–1.24)
GFR, Est AFR Am: 51 mL/min — ABNORMAL LOW (ref >60)
GFR, Estimated: 44 mL/min — ABNORMAL LOW (ref >60)
Glucose, Bld: 106 mg/dL — ABNORMAL HIGH (ref 70–99)
Potassium: 4.4 mmol/L (ref 3.5–5.1)
Sodium: 135 mmol/L (ref 135–145)
Total Bilirubin: 0.2 mg/dL — ABNORMAL LOW (ref 0.3–1.2)
Total Protein: 7 g/dL (ref 6.5–8.1)

## 2020-01-06 LAB — MAGNESIUM: Magnesium: 2.3 mg/dL (ref 1.7–2.4)

## 2020-01-06 MED ORDER — SODIUM CHLORIDE 0.9% FLUSH
10.0000 mL | INTRAVENOUS | Status: DC | PRN
  Administered 2020-01-06: 12:00:00 10 mL
  Filled 2020-01-06: qty 10

## 2020-01-06 MED ORDER — HEPARIN SOD (PORK) LOCK FLUSH 100 UNIT/ML IV SOLN
500.0000 [IU] | Freq: Once | INTRAVENOUS | Status: AC | PRN
  Administered 2020-01-06: 500 [IU]
  Filled 2020-01-06: qty 5

## 2020-01-06 MED ORDER — SODIUM CHLORIDE 0.9 % IV SOLN
800.0000 mg/m2 | Freq: Once | INTRAVENOUS | Status: AC
  Administered 2020-01-06: 1444 mg via INTRAVENOUS
  Filled 2020-01-06: qty 37.98

## 2020-01-06 MED ORDER — PROCHLORPERAZINE MALEATE 10 MG PO TABS
10.0000 mg | ORAL_TABLET | Freq: Once | ORAL | Status: AC
  Administered 2020-01-06: 10:00:00 10 mg via ORAL

## 2020-01-06 MED ORDER — SODIUM CHLORIDE 0.9 % IV SOLN
Freq: Once | INTRAVENOUS | Status: AC
  Filled 2020-01-06: qty 250

## 2020-01-06 MED ORDER — PROCHLORPERAZINE MALEATE 10 MG PO TABS
ORAL_TABLET | ORAL | Status: AC
  Filled 2020-01-06: qty 1

## 2020-01-06 MED ORDER — SODIUM CHLORIDE 0.9% FLUSH
10.0000 mL | INTRAVENOUS | Status: DC | PRN
  Administered 2020-01-06: 10 mL
  Filled 2020-01-06: qty 10

## 2020-01-06 NOTE — Patient Instructions (Signed)
[facility] Discharge Instructions for Patients Receiving Chemotherapy  Today you received the following chemotherapy agents:  Gemcitibine (Gemzar)  To help prevent nausea and vomiting after your treatment, we encourage you to take your nausea medication as prescribed.   If you develop nausea and vomiting that is not controlled by your nausea medication, call the clinic.   BELOW ARE SYMPTOMS THAT SHOULD BE REPORTED IMMEDIATELY:  *FEVER GREATER THAN 100.5 F  *CHILLS WITH OR WITHOUT FEVER  NAUSEA AND VOMITING THAT IS NOT CONTROLLED WITH YOUR NAUSEA MEDICATION  *UNUSUAL SHORTNESS OF BREATH  *UNUSUAL BRUISING OR BLEEDING  TENDERNESS IN MOUTH AND THROAT WITH OR WITHOUT PRESENCE OF ULCERS  *URINARY PROBLEMS  *BOWEL PROBLEMS  UNUSUAL RASH Items with * indicate a potential emergency and should be followed up as soon as possible.  Feel free to call the clinic should you have any questions or concerns. The clinic phone number is [phone].  Please show the [place] at check-in to the Emergency Department and triage nurse.

## 2020-01-06 NOTE — Progress Notes (Signed)
Per Dr. Burr Medico, okay to treat today with SCr and ANC today. Patient to receive fluids and gemcitabine alone today (no cisplatin). No D9 GCSF. MD updated orders.   Demetrius Charity, PharmD, BCPS, [place] [facility] Pharmacist Pharmacy Phone: [phone] 01/06/2020

## 2020-01-07 LAB — CANCER ANTIGEN 19-9: CA 19-9: 57 U/mL — ABNORMAL HIGH (ref 0–35)

## 2020-01-13 ENCOUNTER — Other Ambulatory Visit: Payer: Medicare Other

## 2020-01-13 ENCOUNTER — Ambulatory Visit: Payer: Medicare Other

## 2020-01-14 NOTE — Progress Notes (Signed)
Pharmacist Chemotherapy Monitoring - Follow Up Assessment    I verify that I have reviewed each item in the below checklist:  . Regimen for the patient is scheduled for the appropriate day and plan matches scheduled date. Marland Kitchen Appropriate non-routine labs are ordered dependent on drug ordered. . If applicable, additional medications reviewed and ordered per protocol based on lifetime cumulative doses and/or treatment regimen.   Plan for follow-up and/or issues identified: No . I-vent associated with next due treatment: No . MD and/or nursing notified: No   Kennith Center, Pharm.D., CPP 01/14/2020@11 :25 AM

## 2020-01-19 NOTE — Progress Notes (Signed)
[place] OFFICE PROGRESS NOTE  Christopher Morning, DO [address] [place] [address]  DIAGNOSIS: F/u of ExtrahepaticCholangiocarcinoma  Oncology History  Cholangiocarcinoma [facility])  01/14/2019 Imaging   US Abdomen 01/14/19  IMPRESSION: There is significant intrahepatic and extrahepatic biliary dilatation present, concerning for distal common bile duct obstruction. Further evaluation with CT scan or MRCP is recommended. Correlation with liver function tests is recommended as well. These results will be called to the ordering clinician or representative by the Radiologist Assistant, and communication documented in the PACS or zVision Dashboard.   Probable large amount of sludge seen within gallbladder lumen with mild gallbladder wall thickening. However, no cholelithiasis, pericholecystic fluid or sonographic Murphy's sign is noted.   4.2 cm septated cyst seen in upper pole of right kidney consistent with Bosniak type 2 lesion. Follow-up ultrasound in 1 year is recommended to ensure stability.   01/20/2019 Imaging   MRI abdomen 01/20/19  IMPRESSION: 1. Findings are highly concerning for central tumor in the biliary tract at the confluence of the common hepatic duct, cystic duct and common bile ducts. Further clinical evaluation for potential cholangiocarcinoma is strongly recommended. 2. Mild ductal dilatation of the main pancreatic duct throughout the distal body and tail of the pancreas where there is also some associated side branch ectasia. This may suggest a pancreatic ductal stricture. No obstructing pancreatic neoplasm identified. 3. Aortic atherosclerosis.   01/21/2019 Initial Biopsy   Diagnosis 01/21/19  BILE DUCT BRUSHING (SPECIMEN 1 OF 1 COLLECTED 01/21/2019) ADENOCARCINOMA.   01/21/2019 Procedure   ERCP By Dr hung 01/21/19 IMRPESSION - The major papilla appeared normal. - A single localized biliary stricture was found in the middle  third of the main bile duct. - The entire main bile duct and upper third of the main bile duct were dilated, secondary to a stricture. - A biliary sphincterotomy was performed. - Cells for cytology obtained in the middle third of the main bile duct. - One plastic stent was placed into the common bile duct.  EUS by Dr hung 01/21/19  IMPRESSION - There was dilation in the middle third of the main bile duct and in the upper third of the main bile duct which measured up to 15 mm. - There was a suggestion of a stricture in the middle third of the main bile duct. - No specimens collected.   02/03/2019 Imaging   CT CAP at [facility] 02/03/19  Impression:  1. There is mild intrahepatic and extrahepatic biliary ductal dilation and diffuse common bile duct wall thickening with stent in place. There is abnormal soft tissue measuring approximately 1.6 cm between the common hepatic artery, portal vein and common bile duct, worrisome for the known cholangiocarcinoma.  2. Periportal lymphadenopathy which may represent nodal metastasis.  4. Marked pancreatic atrophy and duct dilation involving the distal body and tail the pancreas where there is a coarse calcification. These findings are favored to represent stenosis from intraductal stones though an underlying stricture cannot be completely excluded.  5. Atypical symmetric prominent fat stranding of the bilateral lower abdominal wall. Correlate with surgical history or history of history of trauma.   03/08/2019 Initial Diagnosis   Cholangiocarcinoma ([place])   03/12/2019 -  Chemotherapy   Gemcitabine and cisplatin on days 1 and 8 every 21 days starting 03/12/19. Abraxane added with cycle 2. Added GCSF ([name]) to day 9 starting cycle 2. Chemo held since 06/03/19 due to poor toleration. Chemo restarted with Gem/Cis with C6 on 07/02/19.  03/19/2019 Cancer Staging   Staging form: Perihilar Bile Ducts, AJCC 8th Edition - Clinical: Stage Unknown (cTX, cN0, cM0)  - Signed by [name], MD on 03/19/2019   05/24/2019 Imaging   CT CAP W Contrast at [facility]  1.  Interval placement of covered metallic biliary stent. No progressive dilation of the biliary tree and resulting resolution of the prior main pancreatic duct dilation. 2.  Evidence of some subtle vascular contour deformity involving the hepatic artery, suspected to be from tumoral contact and/or posttreatment changes. 3.  New small right pleural effusion with overlying mild airspace disease. Secondary findings which may indicate a component of aspiration. 4.  No convincing evidence of new disseminated metastatic disease. 5.  Additional findings as discussed above.   06/18/2019 Imaging   CT AP IMPRESSION: 1.  No acute findings in the abdomen/pelvis. 2. Biliary stent in adequate position. No evidence of focal mass or adenopathy in the region of the pancreatic head or porta hepatis. 3. Moderate size right pleural effusion with associated right basilar atelectasis. 4. Possible single gallstone. Stable subcentimeter liver cyst. Stable right renal cysts. Small left inguinal hernia containing only peritoneal fat. 5. Aortic Atherosclerosis (ICD10-I70.0).   07/28/2019 Imaging   CT CAP IMPRESSION: 1. The patient is status post common bile duct stent placement, with unchanged stent position and patent appearance with pneumobilia. Primary cholangiocarcinoma is not discretely appreciated by CT. 2. No direct evidence of metastatic disease in the chest, abdomen, or pelvis. 3. Moderate right pleural effusion with associated atelectasis or consolidation, slightly improved compared to prior examination. 4. The pancreatic parenchyma is atrophic and calcified, particularly in the pancreatic tail. 5.  Coronary artery disease.  Aortic atherosclerosis   10/20/2019 PET scan   IMPRESSION: 1. Redemonstrated post treatment and post stenting appearance of a central cholangiocarcinoma, which is not directly appreciated  by CT. Common bile duct stent remains in position with post stenting pneumobilia. 2. Slight interval increase in a moderate to large right pleural effusion with associated atelectasis or consolidation. There may be some pleural nodularity about the right hemidiaphragm which appears new compared to prior examination (series 2, image 40), highly suspicious for pleural metastatic disease. 3. No direct evidence of metastatic disease in the chest, abdomen, or pelvis. 4. Coronary artery disease.  Aortic Atherosclerosis (ICD10-I70.0).     10/29/2019 Pathology Results   Thoracentesis FINAL MICROSCOPIC DIAGNOSIS:  - Reactive mesothelial cells present    11/17/2019 PET scan   IMPRESSION: 1. Low-level hypermetabolism within the right pleural space, without focal abnormality to strongly suggest pleural metastasis. 2. Hypermetabolism along the course of the common duct stent, nonspecific and most likely reactive. 3. Otherwise, no evidence of hypermetabolic metastasis. 4. Small to moderate right pleural effusion with developing loculation superiorly. 5. Coronary artery atherosclerosis. Aortic Atherosclerosis (ICD10-I70.0). 6.  Possible constipation.       CURRENT THERAPY: Gemcitabine and cisplatin on days 1 and 8 every 21 daysstarting 03/12/19. Abraxane added with cycle 2.AddedGCSF ([name])to day 9 startingcycle 2.Cisplatin removed starting from cycle 13 due to renal insufficiency.   INTERVAL HISTORY: Christopher Welch 74 y.o. male returns to the clinic for a follow up visit. The patient is feeling well today without any concerning complaints. He feels more energetic since cisplatin was removed from his treatment plan due to renal insufficiency. He states he tries to stay hydrated by drinking 1/2 a gallon of water 6x per day. Denies any fever, chills, or weight loss. He reports "some" night swears 2-3x per week which he  states is unchanged. His appetite is better since starting on remeron. His  weight is stable compared to his prior visit. Denies any chest pain, cough, or hemoptysis but reports mild dyspnea on exertion. Denies any nausea, vomiting, diarrhea, or constipation. Denies abdominal pain. Denies jaundice or itching. Denies any headache or visual changes. At his last visit he endorsed back pain. He denies any recent back pain and states that has resolved. The patient is here today for evaluation prior to starting cycle # 15   MEDICAL HISTORY: Past Medical History:  Diagnosis Date  . Diabetes mellitus type I, controlled ([place])   . Dyslipidemia   . ED (erectile dysfunction)   . Essential hypertension   . Gilbert's disease   . Hepatitis B carrier ([place])     ALLERGIES:  is allergic to erythromycin.  MEDICATIONS:  Current Outpatient Medications  Medication Sig Dispense Refill  . acetaminophen (TYLENOL) 500 MG tablet Take 1,000 mg by mouth 2 (two) times daily as needed for moderate pain or headache.    Marland Kitchen amLODipine (NORVASC) 5 MG tablet Take 5 mg by mouth daily.     Marland Kitchen atenolol (TENORMIN) 25 MG tablet Take 25 mg by mouth daily. Once a day     . atorvastatin (LIPITOR) 10 MG tablet Take 10 mg by mouth daily.    . Blood Glucose Monitoring Suppl (ACCU-CHEK NANO SMARTVIEW) W/DEVICE KIT 1 kit by Does not apply route 2 (two) times daily. (Patient taking differently: 1 kit by Does not apply route See admin instructions. Test blood sugars 12x's daily) 1 kit 0  . clonazePAM (KLONOPIN) 0.5 MG tablet     . glucose blood test strip Test 3 times a day. (Patient taking differently: 1 each by Other route See admin instructions. Test 12 times a day.) 300 each Prn  . insulin NPH Human (NOVOLIN N) 100 UNIT/ML injection Inject 20 Units into the skin at bedtime.     . insulin regular (NOVOLIN R) 100 units/mL injection Inject 20 Units into the skin 3 (three) times daily before meals.     Marland Kitchen levofloxacin (LEVAQUIN) 750 MG tablet Take 1 tablet (750 mg total) by mouth daily. 7 tablet 0  .  lidocaine-prilocaine (EMLA) cream Apply to affected area once 30 g 3  . lisinopril (ZESTRIL) 20 MG tablet Take 20 mg by mouth daily.     Marland Kitchen LORazepam (ATIVAN) 0.5 MG tablet Take 0.5-1 tablets (0.25-0.5 mg total) by mouth every 8 (eight) hours as needed (nausea and vomiting). 30 tablet 0  . magnesium oxide (MAG-OX) 400 MG tablet TAKE 1 TABLET BY MOUTH EVERY DAY 90 tablet 1  . metFORMIN (GLUCOPHAGE) 1000 MG tablet Take 2,000 mg by mouth every evening.     . mirtazapine (REMERON) 7.5 MG tablet TAKE 1 TABLET (7.5 MG TOTAL) BY MOUTH AT BEDTIME. 90 tablet 1  . ondansetron (ZOFRAN) 8 MG tablet TAKE 1 TABLET BY MOUTH 2 TIMES DAY AS NEEDED. START ON 3RD DAY AFTER CHEMOTHERAPY 30 tablet 1  . oxycodone (OXY-IR) 5 MG capsule Take 1 capsule (5 mg total) by mouth every 6 (six) hours as needed for pain (severe pain). 20 capsule 0  . prochlorperazine (COMPAZINE) 10 MG tablet TAKE 1 TABLET (10 MG TOTAL) BY MOUTH EVERY 6 (SIX) HOURS AS NEEDED (NAUSEA OR VOMITING). 30 tablet 1  . zolpidem (AMBIEN) 5 MG tablet Take 1-2 tablets (5-10 mg total) by mouth at bedtime as needed for sleep. 60 tablet 0   No current facility-administered medications for this visit.  Facility-Administered Medications Ordered in Other Visits  Medication Dose Route Frequency Provider Last Rate Last Admin  . 0.9 %  sodium chloride infusion (Manually program via Guardrails IV Fluids)  250 mL Intravenous Once [name], MD      . gemcitabine (GEMZAR) 1,444 mg in sodium chloride 0.9 % 250 mL chemo infusion  800 mg/m2 (Treatment Plan Recorded) Intravenous Once [name], MD      . heparin lock flush 100 unit/mL  500 Units Intracatheter Once PRN [name], MD      . sodium chloride flush (NS) 0.9 % injection 10 mL  10 mL Intracatheter PRN [name], MD        SURGICAL HISTORY:  Past Surgical History:  Procedure Laterality Date  . APPENDECTOMY  1964  . BILIARY BRUSHING  01/21/2019   Procedure: BILIARY BRUSHING;  Surgeon: Carol Ada, MD;   Location: [facility];  Service: Endoscopy;;  . BILIARY STENT PLACEMENT N/A 01/21/2019   Procedure: BILIARY STENT PLACEMENT;  Surgeon: Carol Ada, MD;  Location: [facility];  Service: Endoscopy;  Laterality: N/A;  . ENDOSCOPIC RETROGRADE CHOLANGIOPANCREATOGRAPHY (ERCP) WITH PROPOFOL N/A 01/21/2019   Procedure: ENDOSCOPIC RETROGRADE CHOLANGIOPANCREATOGRAPHY (ERCP) WITH PROPOFOL;  Surgeon: Carol Ada, MD;  Location: [facility];  Service: Endoscopy;  Laterality: N/A;  . ESOPHAGOGASTRODUODENOSCOPY (EGD) WITH PROPOFOL N/A 01/21/2019   Procedure: ESOPHAGOGASTRODUODENOSCOPY (EGD) WITH PROPOFOL;  Surgeon: Carol Ada, MD;  Location: [facility];  Service: Endoscopy;  Laterality: N/A;  . FEMORAL HERNIA REPAIR    . IR CHOLANGIOGRAM EXISTING TUBE  05/13/2019  . IR CHOLANGIOGRAM EXISTING TUBE  06/08/2019  . IR EXCHANGE BILIARY DRAIN  05/18/2019  . IR EXCHANGE BILIARY DRAIN  06/08/2019  . IR IMAGING GUIDED PORT INSERTION  03/08/2019  . IR PERC CHOLECYSTOSTOMY  05/04/2019  . IR THORACENTESIS ASP PLEURAL SPACE W/IMG GUIDE  10/29/2019  . LEFT HEART CATH AND CORONARY ANGIOGRAPHY N/A 05/19/2017   Procedure: LEFT HEART CATH AND CORONARY ANGIOGRAPHY;  Surgeon: Leonie Man, MD;  Location: [place] [facility];  Service: Cardiovascular;  Laterality: N/A;  . SPHINCTEROTOMY  01/21/2019   Procedure: SPHINCTEROTOMY;  Surgeon: Carol Ada, MD;  Location: [facility];  Service: Endoscopy;;  . UPPER ESOPHAGEAL ENDOSCOPIC ULTRASOUND (EUS) N/A 01/21/2019   Procedure: UPPER ESOPHAGEAL ENDOSCOPIC ULTRASOUND (EUS);  Surgeon: Carol Ada, MD;  Location: [facility];  Service: Endoscopy;  Laterality: N/A;    REVIEW OF SYSTEMS:   Review of Systems  Constitutional: Negative for appetite change, chills, fatigue, fever and unexpected weight change.  HENT:   Negative for mouth sores, nosebleeds, sore throat and trouble swallowing.   Eyes: Negative for eye problems and icterus.  Respiratory: Negative for cough, hemoptysis,  shortness of breath and wheezing.  Cardiovascular: Negative for chest pain and leg swelling.  Gastrointestinal: Negative for abdominal pain, constipation, diarrhea, nausea and vomiting.  Genitourinary: Negative for bladder incontinence, difficulty urinating, dysuria, frequency and hematuria.   Musculoskeletal: Negative for back pain, gait problem, neck pain and neck stiffness.  Skin: Negative for itching and rash.  Neurological: Negative for dizziness, extremity weakness, gait problem, headaches, light-headedness and seizures.  Hematological: Negative for adenopathy. Does not bruise/bleed easily.  Psychiatric/Behavioral: Negative for confusion, depression and sleep disturbance. The patient is not nervous/anxious.     PHYSICAL EXAMINATION:  Blood pressure 134/74, pulse 85, temperature 98.7 F (37.1 C), temperature source Temporal, resp. rate 18, height 5' 7" (1.702 m), weight 145 lb 1.6 oz (65.8 kg), SpO2 99 %.  ECOG PERFORMANCE STATUS: 1 - Symptomatic but completely  ambulatory  Physical Exam  Constitutional: Oriented to person, place, and time and well-developed, well-nourished, and in no distress.  HENT:  Head: Normocephalic and atraumatic.  Mouth/Throat: Oropharynx is clear and moist. No oropharyngeal exudate.  Eyes: Conjunctivae are normal. Right eye exhibits no discharge. Left eye exhibits no discharge. No scleral icterus.  Neck: Normal range of motion. Neck supple.  Cardiovascular: Normal rate, regular rhythm, normal heart sounds and intact distal pulses.   Pulmonary/Chest: Effort normal. Mild decrease in breath sounds at base of right lung. No respiratory distress. No wheezes. No rales.  Abdominal: Soft. Bowel sounds are normal. Exhibits no distension and no mass. There is no tenderness.  Musculoskeletal: Normal range of motion. Exhibits no edema.  Lymphadenopathy:    No cervical adenopathy.  Neurological: Alert and oriented to person, place, and time. Exhibits normal muscle tone.  Gait normal. Coordination normal.  Skin: Skin is warm and dry. No rash noted. Not diaphoretic. No erythema. No pallor.  Psychiatric: Mood, memory and judgment normal.  Vitals reviewed.  LABORATORY DATA: Lab Results  Component Value Date   WBC 7.0 01/20/2020   HGB 8.7 (L) 01/20/2020   HCT 27.3 (L) 01/20/2020   MCV 91.6 01/20/2020   PLT 290 01/20/2020      Chemistry      Component Value Date/Time   NA 139 01/20/2020 0817   NA 141 05/14/2017 0809   K 4.3 01/20/2020 0817   CL 105 01/20/2020 0817   CO2 27 01/20/2020 0817   BUN 15 01/20/2020 0817   BUN 11 05/14/2017 0809   CREATININE 1.37 (H) 01/20/2020 0817   CREATININE 1.16 12/05/2011 1117      Component Value Date/Time   CALCIUM 9.3 01/20/2020 0817   ALKPHOS 54 01/20/2020 0817   AST 16 01/20/2020 0817   ALT 9 01/20/2020 0817   BILITOT 0.4 01/20/2020 0817       RADIOGRAPHIC STUDIES:  No results found.  ASSESSMENT & PLAN:  EESHAN VERBRUGGE is a 74 y.o. male with   1. Extrahepatic cholangiocarcinoma, cTxN0M0 -He wasdiagnosedin 12/2018. His work up revealed malignant stricture and CBD obstruction required stenting. Brushing revealed adenocarcinoma. His extrahepatic cholangiocarcinoma was not resectable due to vascular invasion(surgeon - Dr. Zenia Resides at [place] started first line chemogemcitabine and cisplatin on days 1 and 8 every 21 dayson6/12/20. Abraxane added with cycle 2.AddedGCSF ([name])to day 9 startingcycle 2.Abraxane stopped after C4 and chemo held9/3/20-10/2/10 due to poor toleration/hospitalization. He has improved with dose reduction.Cisplatin held starting with C12 due to decreased kidney function. -He met with surgeon Dr. Carlis Abbott on 11/22/19 who does not recommend surgery at this time given his decreased performance statusand technically difficult resection. Will continue with same chemotherapy regimen to control his disease. -He continues to feel better and improving energy lately. Cisplatin  was held starting from cycle 13 given elevated Cr. Labs reviewed today (4/22), WBC 7.0, Hg 8.7, ANC 3.3 Cr 1.37. Overall adequate to proceed with cycle #15 Gem alone today.  -due to his persistent renal issue, will cancel cisplatin and continue gemcitabine alone  -Plan to repeat staging scan after C15. Orders placed for restaging CT scan today. I put a comment on his CT order to hold IV contrast if GFR is <50. The patient prefers to receive his IV contrast via his port-a-cath. He will call us to make a lab and flush appointment the day of his CT scan once it is scheduled by radiology. The target scan date will be the week of 5/3-5/7. He is  aware of this. -F/u in 3 weeks before C16  2.History of acute cholecystitis -He washospitalizedon 05/03/19.  -He had a percutaneous gallbladder drainage tube placed by IR8/5/20.It was removed on 9/8 due to leakage  -Hiscurrent abdominal pain is probably related to the procedure, I encouraged him to use pain medication as needed -No clinical high concern for acute cholecystitis we will continue to monitoring clinically   3.Right-sided pleural effusion -He underwent right thoracentesis with 2.1 is removedon 06/23/2019, cytologynegative.The etiology is unclear, certainly malignancy is ahigh possibility. Hewas treated withcourse of antibiotics -His SOB has progressed lately and he has worsened right pleural effusion on 10/20/19 CT scan. -He had a Thoracentesis on 10/29/19 with 1.3L removed. Cytology showed reactive mesothelial cells, no malignancy.PET from 11/17/19 is not definitve for metastasis but still suspected, will monitor.  4. Weight lossand low appetite -secondary to malignancyand chemo, he presented with 20 lbs weight loss  -Continue to f/u with dietician -His appetitehas dropped significantly with recent cholecystitisand secondary to chemo due to nausea.  -Was encouragedtocontinuenutritional supplement (carnation breakfast and  Boost)to 3-5 bottles aday. -With chemo dose reduction (no Abraxane)he has been able to maintain weight. -He will continue Mirtazapine, 23m HS  5. DM -not well controlled -on insulin,managed by PCP Dr. CTheda Sers-Due to elevated BG secondary to chemo pre-meds, he has been needing to increase his insulin but working on controlling it.  6. CAD, HL, HTN -on amlodipine, atenolol, lisinopril, and statin. Continue tofollow up withcardiologist Dr. HEllyn Hack-controlled and stable  7. Mid abdominal pain and upper back pain -intermittent, back pain is new starting from cycle #14 -Was previously encouraged him to use heating pad on back  -he does not want to have X-ray of his spine. -Will monitor for progression. Due for restaging CT scan before cycle #16. He states his symptoms have resolved at this time (4/22).  8. Elevated Cr -his cr has been fluctuating lately -Cisplatin removed from treatment plan -Cr at 1.37 today.  PLAN: -Labs reviewed and adequate to proceed with C15D1 chemo with gemcitabine alone -NS 1L over 2 hrs for hydration  -Lab, flush, and chemo on 4/29 -Labs, flush, f/u, and infusion on 5/13 and 5/20 -Will order restaging CT scan of chest, abdomen, and pelvis before cycle #16. Note written in CT order to hold IV contrast if GFR <50. Patient will call the clinic to schedule a lab and flush on the day of his scan as he prefers to have his IV contrast administered via port.   Orders Placed This Encounter  Procedures  . CT Chest W Contrast    Please hold IV contrast if GFR <50    Standing Status:   Future    Standing Expiration Date:   01/19/2021    Order Specific Question:   ** REASON FOR EXAM (FREE TEXT)    Answer:   Restagin Cholangiocarcinoma    Order Specific Question:   If indicated for the ordered procedure, I authorize the administration of contrast media per Radiology protocol    Answer:   Yes    Order Specific Question:   [facility]?     [facility]   Order Specific Question:   Radiology Contrast Protocol - do NOT remove file path    Answer:   _0 [email or web address]  . CT Abdomen Pelvis W Contrast    Please hold IV contrast if GFR <50    Standing Status:   Future    Standing Expiration Date:  01/19/2021    Order Specific Question:   ** REASON FOR EXAM (FREE TEXT)    Answer:   Restaging Cholangiocarcinoma    Order Specific Question:   If indicated for the ordered procedure, I authorize the administration of contrast media per Radiology protocol    Answer:   Yes    Order Specific Question:   Preferred imaging location?    Answer:   [facility]    Order Specific Question:   Is Oral Contrast requested for this exam?    Answer:   No oral contrast    Order Specific Question:   Reason for No Oral Contrast    Answer:   Other    Order Specific Question:   Please answer why no oral contrast is requested    Answer:   Not needed    Order Specific Question:   Radiology Contrast Protocol - do NOT remove file path    Answer:   _0 [email or web address]     [place], PA-C 01/20/20

## 2020-01-20 ENCOUNTER — Inpatient Hospital Stay: Payer: Medicare Other

## 2020-01-20 ENCOUNTER — Other Ambulatory Visit: Payer: Self-pay

## 2020-01-20 ENCOUNTER — Inpatient Hospital Stay (HOSPITAL_BASED_OUTPATIENT_CLINIC_OR_DEPARTMENT_OTHER): Payer: Medicare Other | Admitting: Physician Assistant

## 2020-01-20 ENCOUNTER — Telehealth: Payer: Self-pay | Admitting: Physician Assistant

## 2020-01-20 VITALS — BP 134/74 | HR 85 | Temp 98.7°F | Resp 18 | Ht 67.0 in | Wt 145.1 lb

## 2020-01-20 DIAGNOSIS — C221 Intrahepatic bile duct carcinoma: Secondary | ICD-10-CM

## 2020-01-20 DIAGNOSIS — I10 Essential (primary) hypertension: Secondary | ICD-10-CM | POA: Diagnosis not present

## 2020-01-20 DIAGNOSIS — C240 Malignant neoplasm of extrahepatic bile duct: Secondary | ICD-10-CM | POA: Diagnosis not present

## 2020-01-20 DIAGNOSIS — Z5111 Encounter for antineoplastic chemotherapy: Secondary | ICD-10-CM | POA: Diagnosis not present

## 2020-01-20 DIAGNOSIS — Z95828 Presence of other vascular implants and grafts: Secondary | ICD-10-CM

## 2020-01-20 DIAGNOSIS — I2510 Atherosclerotic heart disease of native coronary artery without angina pectoris: Secondary | ICD-10-CM | POA: Diagnosis not present

## 2020-01-20 DIAGNOSIS — E1165 Type 2 diabetes mellitus with hyperglycemia: Secondary | ICD-10-CM | POA: Diagnosis not present

## 2020-01-20 DIAGNOSIS — J90 Pleural effusion, not elsewhere classified: Secondary | ICD-10-CM | POA: Diagnosis not present

## 2020-01-20 LAB — CMP (CANCER CENTER ONLY)
ALT: 9 U/L (ref 0–44)
AST: 16 U/L (ref 15–41)
Albumin: 3.3 g/dL — ABNORMAL LOW (ref 3.5–5.0)
Alkaline Phosphatase: 54 U/L (ref 38–126)
Anion gap: 7 (ref 5–15)
BUN: 15 mg/dL (ref 8–23)
CO2: 27 mmol/L (ref 22–32)
Calcium: 9.3 mg/dL (ref 8.9–10.3)
Chloride: 105 mmol/L (ref 98–111)
Creatinine: 1.37 mg/dL — ABNORMAL HIGH (ref 0.61–1.24)
GFR, Est AFR Am: 59 mL/min — ABNORMAL LOW (ref >60)
GFR, Estimated: 51 mL/min — ABNORMAL LOW (ref >60)
Glucose, Bld: 111 mg/dL — ABNORMAL HIGH (ref 70–99)
Potassium: 4.3 mmol/L (ref 3.5–5.1)
Sodium: 139 mmol/L (ref 135–145)
Total Bilirubin: 0.4 mg/dL (ref 0.3–1.2)
Total Protein: 7.4 g/dL (ref 6.5–8.1)

## 2020-01-20 LAB — CBC WITH DIFFERENTIAL (CANCER CENTER ONLY)
Abs Immature Granulocytes: 0.02 10*3/uL (ref 0.00–0.07)
Basophils Absolute: 0 10*3/uL (ref 0.0–0.1)
Basophils Relative: 0 %
Eosinophils Absolute: 0.1 10*3/uL (ref 0.0–0.5)
Eosinophils Relative: 2 %
HCT: 27.3 % — ABNORMAL LOW (ref 39.0–52.0)
Hemoglobin: 8.7 g/dL — ABNORMAL LOW (ref 13.0–17.0)
Immature Granulocytes: 0 %
Lymphocytes Relative: 32 %
Lymphs Abs: 2.2 10*3/uL (ref 0.7–4.0)
MCH: 29.2 pg (ref 26.0–34.0)
MCHC: 31.9 g/dL (ref 30.0–36.0)
MCV: 91.6 fL (ref 80.0–100.0)
Monocytes Absolute: 1.3 10*3/uL — ABNORMAL HIGH (ref 0.1–1.0)
Monocytes Relative: 19 %
Neutro Abs: 3.3 10*3/uL (ref 1.7–7.7)
Neutrophils Relative %: 47 %
Platelet Count: 290 10*3/uL (ref 150–400)
RBC: 2.98 MIL/uL — ABNORMAL LOW (ref 4.22–5.81)
RDW: 17.6 % — ABNORMAL HIGH (ref 11.5–15.5)
WBC Count: 7 10*3/uL (ref 4.0–10.5)
nRBC: 0 % (ref 0.0–0.2)

## 2020-01-20 LAB — MAGNESIUM: Magnesium: 2.1 mg/dL (ref 1.7–2.4)

## 2020-01-20 MED ORDER — HEPARIN SOD (PORK) LOCK FLUSH 100 UNIT/ML IV SOLN
500.0000 [IU] | Freq: Once | INTRAVENOUS | Status: AC | PRN
  Administered 2020-01-20: 500 [IU]
  Filled 2020-01-20: qty 5

## 2020-01-20 MED ORDER — SODIUM CHLORIDE 0.9 % IV SOLN
Freq: Once | INTRAVENOUS | Status: AC
  Filled 2020-01-20: qty 250

## 2020-01-20 MED ORDER — PROCHLORPERAZINE MALEATE 10 MG PO TABS
ORAL_TABLET | ORAL | Status: AC
  Filled 2020-01-20: qty 1

## 2020-01-20 MED ORDER — SODIUM CHLORIDE 0.9 % IV SOLN
800.0000 mg/m2 | Freq: Once | INTRAVENOUS | Status: AC
  Administered 2020-01-20: 1444 mg via INTRAVENOUS
  Filled 2020-01-20: qty 37.98

## 2020-01-20 MED ORDER — SODIUM CHLORIDE 0.9% FLUSH
10.0000 mL | INTRAVENOUS | Status: DC | PRN
  Administered 2020-01-20: 10 mL
  Filled 2020-01-20: qty 10

## 2020-01-20 MED ORDER — PROCHLORPERAZINE MALEATE 10 MG PO TABS
10.0000 mg | ORAL_TABLET | Freq: Once | ORAL | Status: AC
  Administered 2020-01-20: 10 mg via ORAL

## 2020-01-20 NOTE — Telephone Encounter (Signed)
Scheduled appt per 4/22 los - Rn in tx area aware of appts added and will give pt updated schedule before he leaves today '

## 2020-01-20 NOTE — Patient Instructions (Signed)
[facility] °Discharge Instructions for Patients Receiving Chemotherapy ° °Today you received the following chemotherapy agents Gemzar ° °To help prevent nausea and vomiting after your treatment, we encourage you to take your nausea medication as directed. °  °If you develop nausea and vomiting that is not controlled by your nausea medication, call the clinic.  ° °BELOW ARE SYMPTOMS THAT SHOULD BE REPORTED IMMEDIATELY: °· *FEVER GREATER THAN 100.5 F °· *CHILLS WITH OR WITHOUT FEVER °· NAUSEA AND VOMITING THAT IS NOT CONTROLLED WITH YOUR NAUSEA MEDICATION °· *UNUSUAL SHORTNESS OF BREATH °· *UNUSUAL BRUISING OR BLEEDING °· TENDERNESS IN MOUTH AND THROAT WITH OR WITHOUT PRESENCE OF ULCERS °· *URINARY PROBLEMS °· *BOWEL PROBLEMS °· UNUSUAL RASH °Items with * indicate a potential emergency and should be followed up as soon as possible. ° °Feel free to call the clinic should you have any questions or concerns. The clinic phone number is [phone]. ° °Please show the CHEMO ALERT CARD at check-in to the Emergency Department and triage nurse. ° ° °

## 2020-01-21 ENCOUNTER — Encounter: Payer: Self-pay | Admitting: Hematology

## 2020-01-21 NOTE — Progress Notes (Signed)
Pharmacist Chemotherapy Monitoring - Follow Up Assessment    I verify that I have reviewed each item in the below checklist:  . Regimen for the patient is scheduled for the appropriate day and plan matches scheduled date. Marland Kitchen Appropriate non-routine labs are ordered dependent on drug ordered. . If applicable, additional medications reviewed and ordered per protocol based on lifetime cumulative doses and/or treatment regimen.   Plan for follow-up and/or issues identified: No . I-vent associated with next due treatment: No . MD and/or nursing notified: No  Wasil Wolke D 01/21/2020 11:35 AM

## 2020-01-27 ENCOUNTER — Ambulatory Visit: Payer: Medicare Other | Admitting: Hematology

## 2020-01-27 ENCOUNTER — Other Ambulatory Visit: Payer: Self-pay

## 2020-01-27 ENCOUNTER — Inpatient Hospital Stay: Payer: Medicare Other

## 2020-01-27 VITALS — BP 149/78 | HR 60 | Temp 98.5°F | Resp 20

## 2020-01-27 DIAGNOSIS — J90 Pleural effusion, not elsewhere classified: Secondary | ICD-10-CM | POA: Diagnosis not present

## 2020-01-27 DIAGNOSIS — C221 Intrahepatic bile duct carcinoma: Secondary | ICD-10-CM

## 2020-01-27 DIAGNOSIS — Z5111 Encounter for antineoplastic chemotherapy: Secondary | ICD-10-CM | POA: Diagnosis not present

## 2020-01-27 DIAGNOSIS — E1165 Type 2 diabetes mellitus with hyperglycemia: Secondary | ICD-10-CM | POA: Diagnosis not present

## 2020-01-27 DIAGNOSIS — I10 Essential (primary) hypertension: Secondary | ICD-10-CM | POA: Diagnosis not present

## 2020-01-27 DIAGNOSIS — I2510 Atherosclerotic heart disease of native coronary artery without angina pectoris: Secondary | ICD-10-CM | POA: Diagnosis not present

## 2020-01-27 DIAGNOSIS — C240 Malignant neoplasm of extrahepatic bile duct: Secondary | ICD-10-CM | POA: Diagnosis not present

## 2020-01-27 DIAGNOSIS — Z95828 Presence of other vascular implants and grafts: Secondary | ICD-10-CM

## 2020-01-27 LAB — CBC WITH DIFFERENTIAL (CANCER CENTER ONLY)
Abs Immature Granulocytes: 0.02 10*3/uL (ref 0.00–0.07)
Basophils Absolute: 0 10*3/uL (ref 0.0–0.1)
Basophils Relative: 1 %
Eosinophils Absolute: 0 10*3/uL (ref 0.0–0.5)
Eosinophils Relative: 0 %
HCT: 26.1 % — ABNORMAL LOW (ref 39.0–52.0)
Hemoglobin: 8.4 g/dL — ABNORMAL LOW (ref 13.0–17.0)
Immature Granulocytes: 0 %
Lymphocytes Relative: 39 %
Lymphs Abs: 2 10*3/uL (ref 0.7–4.0)
MCH: 29.4 pg (ref 26.0–34.0)
MCHC: 32.2 g/dL (ref 30.0–36.0)
MCV: 91.3 fL (ref 80.0–100.0)
Monocytes Absolute: 0.8 10*3/uL (ref 0.1–1.0)
Monocytes Relative: 15 %
Neutro Abs: 2.4 10*3/uL (ref 1.7–7.7)
Neutrophils Relative %: 45 %
Platelet Count: 286 10*3/uL (ref 150–400)
RBC: 2.86 MIL/uL — ABNORMAL LOW (ref 4.22–5.81)
RDW: 17.2 % — ABNORMAL HIGH (ref 11.5–15.5)
WBC Count: 5.3 10*3/uL (ref 4.0–10.5)
nRBC: 0 % (ref 0.0–0.2)

## 2020-01-27 LAB — CMP (CANCER CENTER ONLY)
ALT: 14 U/L (ref 0–44)
AST: 19 U/L (ref 15–41)
Albumin: 3.2 g/dL — ABNORMAL LOW (ref 3.5–5.0)
Alkaline Phosphatase: 54 U/L (ref 38–126)
Anion gap: 10 (ref 5–15)
BUN: 18 mg/dL (ref 8–23)
CO2: 27 mmol/L (ref 22–32)
Calcium: 9.4 mg/dL (ref 8.9–10.3)
Chloride: 105 mmol/L (ref 98–111)
Creatinine: 1.35 mg/dL — ABNORMAL HIGH (ref 0.61–1.24)
GFR, Est AFR Am: >60 mL/min (ref >60)
GFR, Estimated: 52 mL/min — ABNORMAL LOW (ref >60)
Glucose, Bld: 55 mg/dL — ABNORMAL LOW (ref 70–99)
Potassium: 4 mmol/L (ref 3.5–5.1)
Sodium: 142 mmol/L (ref 135–145)
Total Bilirubin: 0.4 mg/dL (ref 0.3–1.2)
Total Protein: 7.4 g/dL (ref 6.5–8.1)

## 2020-01-27 LAB — MAGNESIUM: Magnesium: 2.1 mg/dL (ref 1.7–2.4)

## 2020-01-27 MED ORDER — PROCHLORPERAZINE MALEATE 10 MG PO TABS
10.0000 mg | ORAL_TABLET | Freq: Once | ORAL | Status: AC
  Administered 2020-01-27: 09:00:00 10 mg via ORAL

## 2020-01-27 MED ORDER — SODIUM CHLORIDE 0.9% FLUSH
10.0000 mL | INTRAVENOUS | Status: DC | PRN
  Administered 2020-01-27: 11:00:00 10 mL
  Filled 2020-01-27: qty 10

## 2020-01-27 MED ORDER — HEPARIN SOD (PORK) LOCK FLUSH 100 UNIT/ML IV SOLN
500.0000 [IU] | Freq: Once | INTRAVENOUS | Status: AC | PRN
  Administered 2020-01-27: 500 [IU]
  Filled 2020-01-27: qty 5

## 2020-01-27 MED ORDER — PROCHLORPERAZINE MALEATE 10 MG PO TABS
ORAL_TABLET | ORAL | Status: AC
  Filled 2020-01-27: qty 1

## 2020-01-27 MED ORDER — SODIUM CHLORIDE 0.9% FLUSH
10.0000 mL | INTRAVENOUS | Status: DC | PRN
  Filled 2020-01-27: qty 10

## 2020-01-27 MED ORDER — SODIUM CHLORIDE 0.9 % IV SOLN
800.0000 mg/m2 | Freq: Once | INTRAVENOUS | Status: AC
  Administered 2020-01-27: 1444 mg via INTRAVENOUS
  Filled 2020-01-27: qty 37.98

## 2020-01-27 MED ORDER — SODIUM CHLORIDE 0.9 % IV SOLN
Freq: Once | INTRAVENOUS | Status: AC
  Filled 2020-01-27: qty 250

## 2020-01-27 NOTE — Patient Instructions (Signed)
[facility] °Discharge Instructions for Patients Receiving Chemotherapy ° °Today you received the following chemotherapy agents Gemzar ° °To help prevent nausea and vomiting after your treatment, we encourage you to take your nausea medication as directed. °  °If you develop nausea and vomiting that is not controlled by your nausea medication, call the clinic.  ° °BELOW ARE SYMPTOMS THAT SHOULD BE REPORTED IMMEDIATELY: °· *FEVER GREATER THAN 100.5 F °· *CHILLS WITH OR WITHOUT FEVER °· NAUSEA AND VOMITING THAT IS NOT CONTROLLED WITH YOUR NAUSEA MEDICATION °· *UNUSUAL SHORTNESS OF BREATH °· *UNUSUAL BRUISING OR BLEEDING °· TENDERNESS IN MOUTH AND THROAT WITH OR WITHOUT PRESENCE OF ULCERS °· *URINARY PROBLEMS °· *BOWEL PROBLEMS °· UNUSUAL RASH °Items with * indicate a potential emergency and should be followed up as soon as possible. ° °Feel free to call the clinic should you have any questions or concerns. The clinic phone number is [phone]. ° °Please show the CHEMO ALERT CARD at check-in to the Emergency Department and triage nurse. ° ° °

## 2020-01-28 LAB — CANCER ANTIGEN 19-9: CA 19-9: 52 U/mL — ABNORMAL HIGH (ref 0–35)

## 2020-02-01 ENCOUNTER — Encounter: Payer: Self-pay | Admitting: Hematology

## 2020-02-02 ENCOUNTER — Encounter: Payer: Self-pay | Admitting: Hematology

## 2020-02-02 NOTE — Telephone Encounter (Signed)
Dr. Burr Medico has competed the dental form.  Christopher Welch is to have a CBC the day before his dental procedure.  He is not having the procedure on 5/18 but will let us know the date.

## 2020-02-03 ENCOUNTER — Inpatient Hospital Stay: Payer: Medicare Other | Attending: Hematology

## 2020-02-03 ENCOUNTER — Encounter: Payer: Self-pay | Admitting: Hematology

## 2020-02-03 ENCOUNTER — Other Ambulatory Visit: Payer: Self-pay

## 2020-02-03 ENCOUNTER — Ambulatory Visit (HOSPITAL_COMMUNITY)
Admission: RE | Admit: 2020-02-03 | Discharge: 2020-02-03 | Disposition: A | Discharge status: Home / Self Care | Payer: Medicare Other | Source: Ambulatory Visit | Attending: Physician Assistant | Admitting: Physician Assistant

## 2020-02-03 DIAGNOSIS — I2510 Atherosclerotic heart disease of native coronary artery without angina pectoris: Secondary | ICD-10-CM | POA: Insufficient documentation

## 2020-02-03 DIAGNOSIS — G4700 Insomnia, unspecified: Secondary | ICD-10-CM | POA: Insufficient documentation

## 2020-02-03 DIAGNOSIS — Z8719 Personal history of other diseases of the digestive system: Secondary | ICD-10-CM | POA: Insufficient documentation

## 2020-02-03 DIAGNOSIS — E1040 Type 1 diabetes mellitus with diabetic neuropathy, unspecified: Secondary | ICD-10-CM | POA: Insufficient documentation

## 2020-02-03 DIAGNOSIS — C240 Malignant neoplasm of extrahepatic bile duct: Secondary | ICD-10-CM | POA: Insufficient documentation

## 2020-02-03 DIAGNOSIS — Z794 Long term (current) use of insulin: Secondary | ICD-10-CM | POA: Insufficient documentation

## 2020-02-03 DIAGNOSIS — Z5111 Encounter for antineoplastic chemotherapy: Secondary | ICD-10-CM | POA: Insufficient documentation

## 2020-02-03 DIAGNOSIS — I10 Essential (primary) hypertension: Secondary | ICD-10-CM | POA: Insufficient documentation

## 2020-02-03 DIAGNOSIS — I700 Atherosclerosis of aorta: Secondary | ICD-10-CM | POA: Diagnosis not present

## 2020-02-03 DIAGNOSIS — C221 Intrahepatic bile duct carcinoma: Secondary | ICD-10-CM | POA: Diagnosis not present

## 2020-02-03 DIAGNOSIS — J90 Pleural effusion, not elsewhere classified: Secondary | ICD-10-CM | POA: Diagnosis not present

## 2020-02-03 DIAGNOSIS — K5900 Constipation, unspecified: Secondary | ICD-10-CM | POA: Insufficient documentation

## 2020-02-03 DIAGNOSIS — R202 Paresthesia of skin: Secondary | ICD-10-CM | POA: Insufficient documentation

## 2020-02-03 IMAGING — DX DG ABDOMEN 2V
3 series · 3 of 3 positions shown · non-contrast
Comparison: July 28, 2019.

CLINICAL DATA: Vomiting.

EXAM:
ABDOMEN - 2 VIEW

[abdomen supine (1 of 2)]
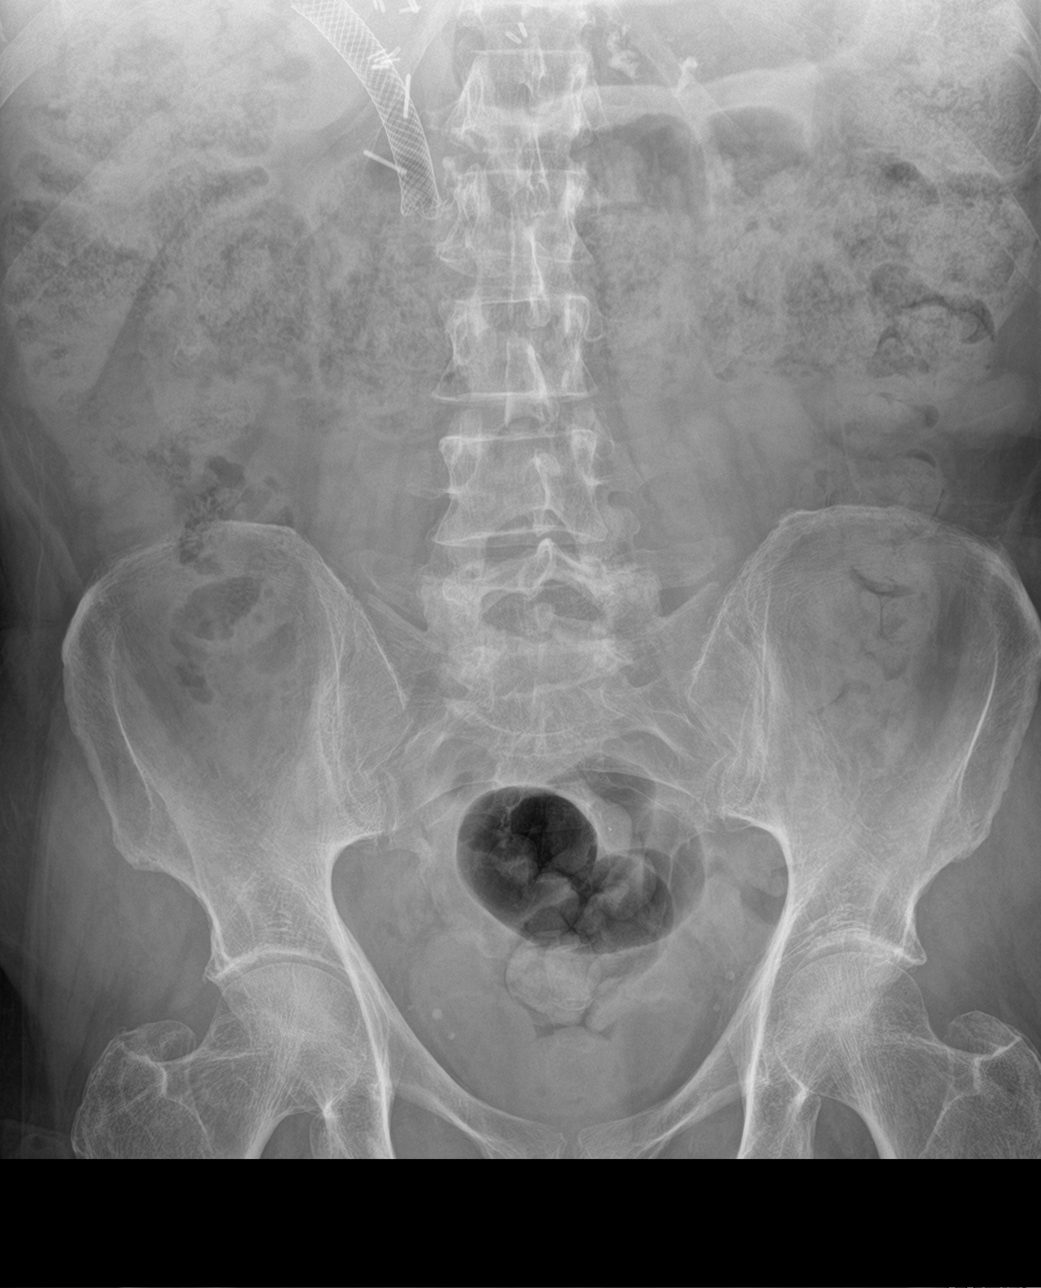

[abdomen supine (2 of 2)]
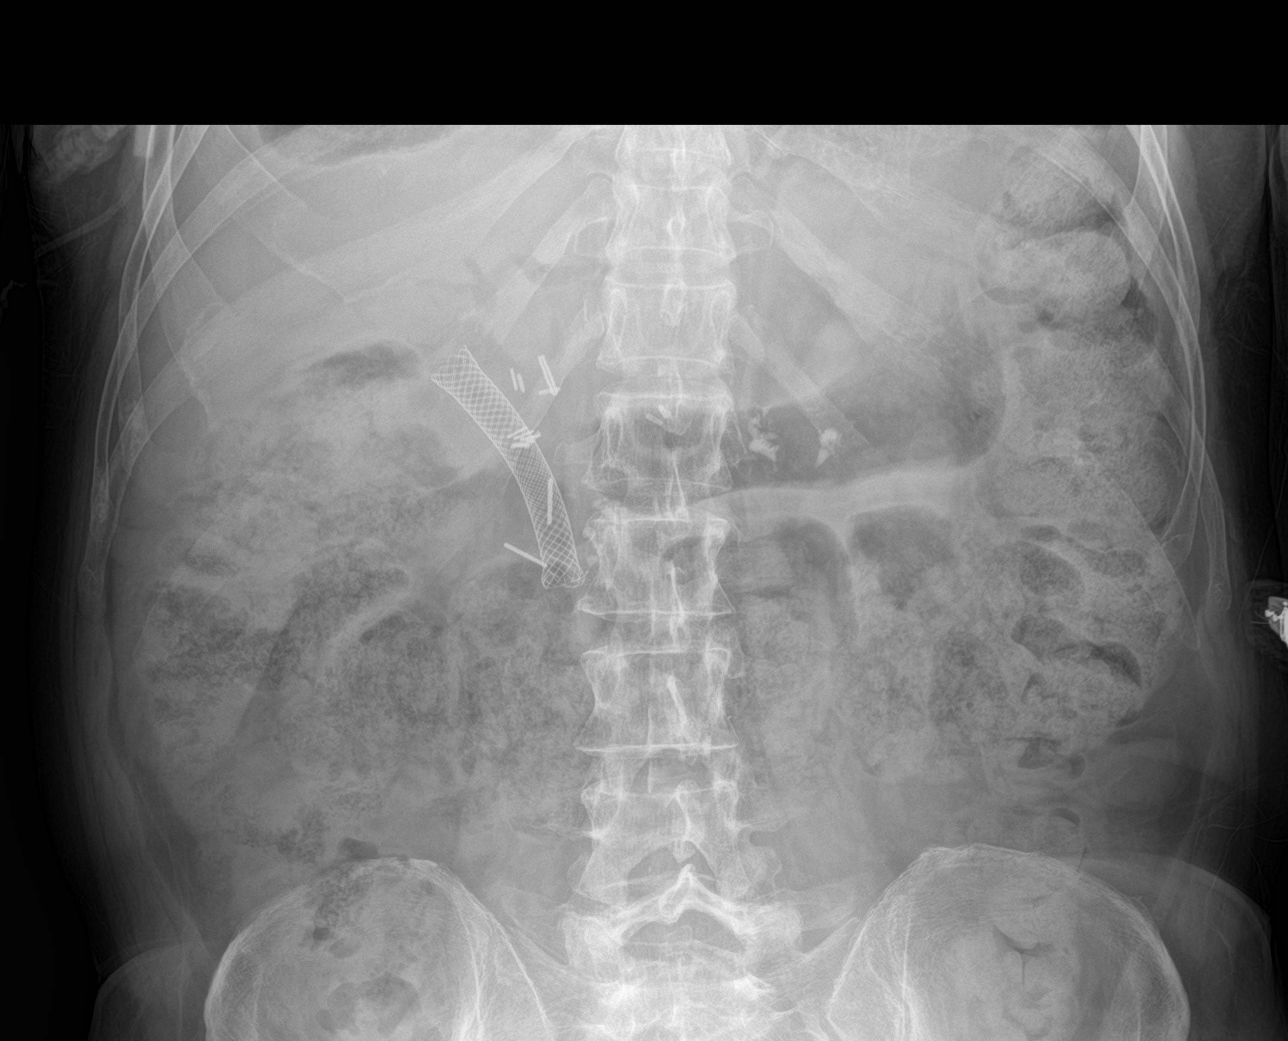

[abdomen erect]
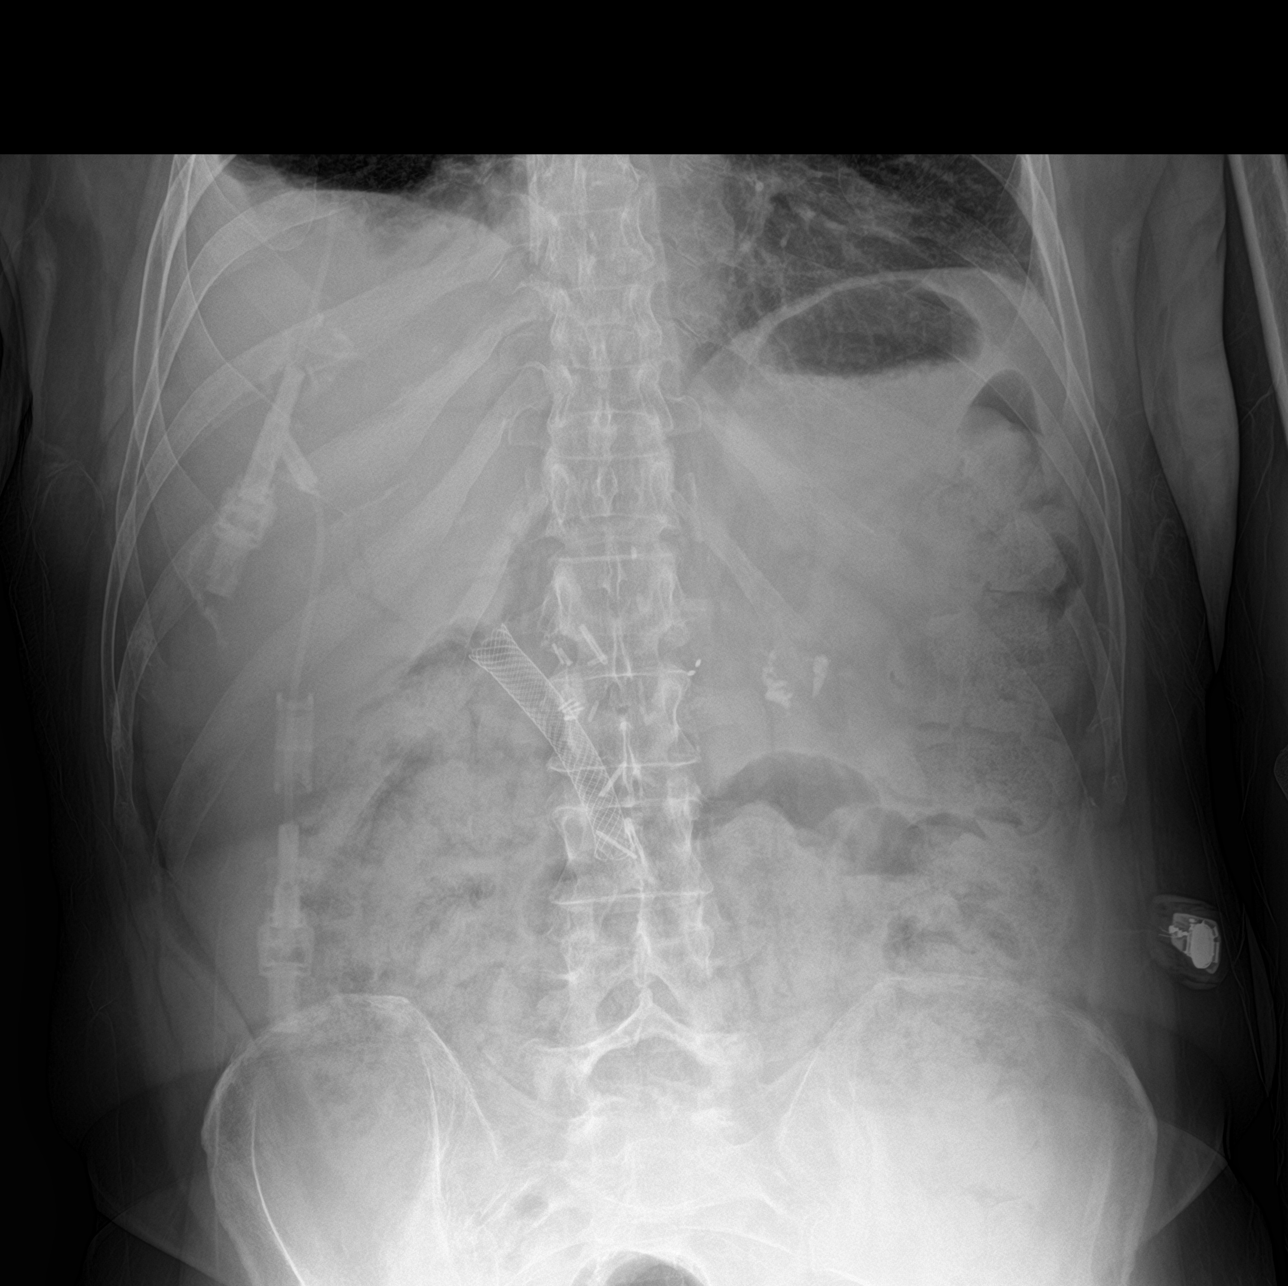

[3 of 3 positions shown; findings below may reference images not displayed]

FINDINGS: No abnormal bowel dilatation is noted. Large amount of stool seen
throughout the colon. Biliary stent is noted. Surgical clips are
noted in right upper quadrant. No free air is noted.
IMPRESSION: Large amount of stool seen throughout the colon suggesting
constipation. No abnormal bowel dilatation is noted. Biliary stent
is noted.

## 2020-02-03 MED ORDER — HEPARIN SOD (PORK) LOCK FLUSH 100 UNIT/ML IV SOLN
INTRAVENOUS | Status: AC
  Filled 2020-02-03: qty 5

## 2020-02-03 MED ORDER — SODIUM CHLORIDE (PF) 0.9 % IJ SOLN
INTRAMUSCULAR | Status: AC
  Filled 2020-02-03: qty 50

## 2020-02-03 MED ORDER — HEPARIN SOD (PORK) LOCK FLUSH 100 UNIT/ML IV SOLN
500.0000 [IU] | Freq: Once | INTRAVENOUS | Status: AC
  Administered 2020-02-03: 500 [IU] via INTRAVENOUS

## 2020-02-03 MED ORDER — IOHEXOL 300 MG/ML  SOLN
100.0000 mL | Freq: Once | INTRAMUSCULAR | Status: AC | PRN
  Administered 2020-02-03: 11:00:00 100 mL via INTRAVENOUS

## 2020-02-04 ENCOUNTER — Other Ambulatory Visit: Payer: Self-pay | Admitting: Hematology

## 2020-02-04 ENCOUNTER — Encounter: Payer: Self-pay | Admitting: Hematology

## 2020-02-04 NOTE — Progress Notes (Signed)
Pharmacist Chemotherapy Monitoring - Follow Up Assessment    I verify that I have reviewed each item in the below checklist:  . Regimen for the patient is scheduled for the appropriate day and plan matches scheduled date. Marland Kitchen Appropriate non-routine labs are ordered dependent on drug ordered. . If applicable, additional medications reviewed and ordered per protocol based on lifetime cumulative doses and/or treatment regimen.   Plan for follow-up and/or issues identified: Yes . I-vent associated with next due treatment: Yes . MD and/or nursing notified: Yes  Britt Boozer 02/04/2020 3:59 PM

## 2020-02-07 NOTE — Progress Notes (Signed)
[facility]   [phone] [phone]   Clinic Follow up Note   Patient Care Team: [name], DO as PCP - General (Family Medicine) 02/10/2020  CHIEF COMPLAINT: F/u extrahepatic cholangiocarcinoma   SUMMARY OF ONCOLOGIC HISTORY: Oncology History  Cholangiocarcinoma ([place])  01/14/2019 Imaging   US Abdomen 01/14/19  IMPRESSION: There is significant intrahepatic and extrahepatic biliary dilatation present, concerning for distal common bile duct obstruction. Further evaluation with CT scan or MRCP is recommended. Correlation with liver function tests is recommended as well. These results will be called to the ordering clinician or representative by the Radiologist Assistant, and communication documented in the PACS or zVision Dashboard.   Probable large amount of sludge seen within gallbladder lumen with mild gallbladder wall thickening. However, no cholelithiasis, pericholecystic fluid or sonographic Murphy's sign is noted.   4.2 cm septated cyst seen in upper pole of right kidney consistent with Bosniak type 2 lesion. Follow-up ultrasound in 1 year is recommended to ensure stability.   01/20/2019 Imaging   MRI abdomen 01/20/19  IMPRESSION: 1. Findings are highly concerning for central tumor in the biliary tract at the confluence of the common hepatic duct, cystic duct and common bile ducts. Further clinical evaluation for potential cholangiocarcinoma is strongly recommended. 2. Mild ductal dilatation of the main pancreatic duct throughout the distal body and tail of the pancreas where there is also some associated side branch ectasia. This may suggest a pancreatic ductal stricture. No obstructing pancreatic neoplasm identified. 3. Aortic atherosclerosis.   01/21/2019 Initial Biopsy   Diagnosis 01/21/19  BILE DUCT BRUSHING (SPECIMEN 1 OF 1 COLLECTED 01/21/2019) ADENOCARCINOMA.   01/21/2019 Procedure   ERCP By Dr hung 01/21/19 IMRPESSION -  The major papilla appeared normal. - A single localized biliary stricture was found in the middle third of the main bile duct. - The entire main bile duct and upper third of the main bile duct were dilated, secondary to a stricture. - A biliary sphincterotomy was performed. - Cells for cytology obtained in the middle third of the main bile duct. - One plastic stent was placed into the common bile duct.  EUS by Dr hung 01/21/19  IMPRESSION - There was dilation in the middle third of the main bile duct and in the upper third of the main bile duct which measured up to 15 mm. - There was a suggestion of a stricture in the middle third of the main bile duct. - No specimens collected.   02/03/2019 Imaging   CT CAP at [facility] 02/03/19  Impression:  1. There is mild intrahepatic and extrahepatic biliary ductal dilation and diffuse common bile duct wall thickening with stent in place. There is abnormal soft tissue measuring approximately 1.6 cm between the common hepatic artery, portal vein and common bile duct, worrisome for the known cholangiocarcinoma.  2. Periportal lymphadenopathy which may represent nodal metastasis.  4. Marked pancreatic atrophy and duct dilation involving the distal body and tail the pancreas where there is a coarse calcification. These findings are favored to represent stenosis from intraductal stones though an underlying stricture cannot be completely excluded.  5. Atypical symmetric prominent fat stranding of the bilateral lower abdominal wall. Correlate with surgical history or history of history of trauma.   03/08/2019 Initial Diagnosis   Cholangiocarcinoma ([place])   03/12/2019 -  Chemotherapy   Gemcitabine and cisplatin on days 1 and 8 every 21 days starting 03/12/19. Abraxane added with cycle 2. Added GCSF ([name]) to day 9 starting cycle  2. Chemo held since 06/03/19 due to poor toleration. Chemo restarted with Gem/Cis with C6 on 07/02/19.    03/19/2019 Cancer Staging     Staging form: Perihilar Bile Ducts, AJCC 8th Edition - Clinical: Stage Unknown (cTX, cN0, cM0) - Signed by [name], MD on 03/19/2019   05/24/2019 Imaging   CT CAP W Contrast at [facility]  1.  Interval placement of covered metallic biliary stent. No progressive dilation of the biliary tree and resulting resolution of the prior main pancreatic duct dilation. 2.  Evidence of some subtle vascular contour deformity involving the hepatic artery, suspected to be from tumoral contact and/or posttreatment changes. 3.  New small right pleural effusion with overlying mild airspace disease. Secondary findings which may indicate a component of aspiration. 4.  No convincing evidence of new disseminated metastatic disease. 5.  Additional findings as discussed above.   06/18/2019 Imaging   CT AP IMPRESSION: 1.  No acute findings in the abdomen/pelvis. 2. Biliary stent in adequate position. No evidence of focal mass or adenopathy in the region of the pancreatic head or porta hepatis. 3. Moderate size right pleural effusion with associated right basilar atelectasis. 4. Possible single gallstone. Stable subcentimeter liver cyst. Stable right renal cysts. Small left inguinal hernia containing only peritoneal fat. 5. Aortic Atherosclerosis (ICD10-I70.0).   07/28/2019 Imaging   CT CAP IMPRESSION: 1. The patient is status post common bile duct stent placement, with unchanged stent position and patent appearance with pneumobilia. Primary cholangiocarcinoma is not discretely appreciated by CT. 2. No direct evidence of metastatic disease in the chest, abdomen, or pelvis. 3. Moderate right pleural effusion with associated atelectasis or consolidation, slightly improved compared to prior examination. 4. The pancreatic parenchyma is atrophic and calcified, particularly in the pancreatic tail. 5.  Coronary artery disease.  Aortic atherosclerosis   10/20/2019 PET scan   IMPRESSION: 1. Redemonstrated post  treatment and post stenting appearance of a central cholangiocarcinoma, which is not directly appreciated by CT. Common bile duct stent remains in position with post stenting pneumobilia. 2. Slight interval increase in a moderate to large right pleural effusion with associated atelectasis or consolidation. There may be some pleural nodularity about the right hemidiaphragm which appears new compared to prior examination (series 2, image 40), highly suspicious for pleural metastatic disease. 3. No direct evidence of metastatic disease in the chest, abdomen, or pelvis. 4. Coronary artery disease.  Aortic Atherosclerosis (ICD10-I70.0).     10/29/2019 Pathology Results   Thoracentesis FINAL MICROSCOPIC DIAGNOSIS:  - Reactive mesothelial cells present    11/17/2019 PET scan   IMPRESSION: 1. Low-level hypermetabolism within the right pleural space, without focal abnormality to strongly suggest pleural metastasis. 2. Hypermetabolism along the course of the common duct stent, nonspecific and most likely reactive. 3. Otherwise, no evidence of hypermetabolic metastasis. 4. Small to moderate right pleural effusion with developing loculation superiorly. 5. Coronary artery atherosclerosis. Aortic Atherosclerosis (ICD10-I70.0). 6.  Possible constipation.     02/03/2020 Imaging   CT CAP IMPRESSION: 1. Stable indistinct circumferential biliary wall thickening in the proximal CBD, without discrete biliary mass. Stable well-positioned CBD stent with intrahepatic pneumobilia indicating patency. 2. No findings of metastatic disease in the abdomen or pelvis. 3. Small dependent right pleural effusion is decreased. Mild diffuse smooth right pleural thickening is similar to slightly increased, nonspecific, cannot exclude malignant pleural effusion. No discrete pleural nodularity. 4. Tiny clustered tree-in-bud type nodules in the anterior left upper lobe, slightly increased, nonspecific, more  likely inflammatory. Recommend attention on  follow-up chest CT in 3 months. 5. Chronic findings include: Chronic pancreatitis. Coronary atherosclerosis. Aortic Atherosclerosis (ICD10-I70.0).       CURRENT THERAPY:  Gemcitabine and cisplatin on days 1 and 8 every 21 daysstarting 03/12/19. Abraxane added with cycle 2.AddedGCSF ([name])to day 9 startingcycle 2.Cisplatin removed starting from cycle 13 due to renal insufficiency.   INTERVAL HISTORY: Mr. Peacock returns for f/u and treatment as scheduled. He completed cycle 15 day 8 on 01/27/20. He feels better on single agent gemcitabine, less "labored breathing" and more energy, he feels like doing more. Appetite remains adequate. He has 3 decayed teeth that will be extracted next week on 5/18, he will have labs 5/17 for dental clearance. Denies mucositis. He manages mild constipation with stool softener and additional laxative if needed. Denies n/v/d. Mild tingling in fingers and toes is stable, able to function well and play piano a little, though he is less interested in it now. Has occasional rare mid abdominal pain the he manages with tylenol mostly, sometimes needs something stronger but ran out of oxycodone.   Otherwise, he denies bleeding, rash, fever, chills, cough, chest pain, leg edema or new concerns.    MEDICAL HISTORY:  Past Medical History:  Diagnosis Date  . Diabetes mellitus type I, controlled ([place])   . Dyslipidemia   . ED (erectile dysfunction)   . Essential hypertension   . Gilbert's disease   . Hepatitis B carrier [facility])     SURGICAL HISTORY: Past Surgical History:  Procedure Laterality Date  . APPENDECTOMY  1964  . BILIARY BRUSHING  01/21/2019   Procedure: BILIARY BRUSHING;  Surgeon: Carol Ada, MD;  Location: [facility] ENDOSCOPY;  Service: Endoscopy;;  . BILIARY STENT PLACEMENT N/A 01/21/2019   Procedure: BILIARY STENT PLACEMENT;  Surgeon: Carol Ada, MD;  Location: [facility] ENDOSCOPY;  Service: Endoscopy;  Laterality:  N/A;  . ENDOSCOPIC RETROGRADE CHOLANGIOPANCREATOGRAPHY (ERCP) WITH PROPOFOL N/A 01/21/2019   Procedure: ENDOSCOPIC RETROGRADE CHOLANGIOPANCREATOGRAPHY (ERCP) WITH PROPOFOL;  Surgeon: Carol Ada, MD;  Location: [facility];  Service: Endoscopy;  Laterality: N/A;  . ESOPHAGOGASTRODUODENOSCOPY (EGD) WITH PROPOFOL N/A 01/21/2019   Procedure: ESOPHAGOGASTRODUODENOSCOPY (EGD) WITH PROPOFOL;  Surgeon: Carol Ada, MD;  Location: [facility]OS[facility];  Service: Endoscopy;  Laterality: N/A;  . FEMORAL HERNIA REPAIR    . IR CHOLANGIOGRAM EXISTING TUBE  05/13/2019  . IR CHOLANGIOGRAM EXISTING TUBE  06/08/2019  . IR EXCHANGE BILIARY DRAIN  05/18/2019  . IR EXCHANGE BILIARY DRAIN  06/08/2019  . IR IMAGING GUIDED PORT INSERTION  03/08/2019  . IR PERC CHOLECYSTOSTOMY  05/04/2019  . IR THORACENTESIS ASP PLEURAL SPACE W/IMG GUIDE  10/29/2019  . LEFT HEART CATH AND CORONARY ANGIOGRAPHY N/A 05/19/2017   Procedure: LEFT HEART CATH AND CORONARY ANGIOGRAPHY;  Surgeon: Leonie Man, MD;  Location: [facility] LAB;  Service: Cardiovascular;  Laterality: N/A;  . SPHINCTEROTOMY  01/21/2019   Procedure: SPHINCTEROTOMY;  Surgeon: Carol Ada, MD;  Location: [facility];  Service: Endoscopy;;  . UPPER ESOPHAGEAL ENDOSCOPIC ULTRASOUND (EUS) N/A 01/21/2019   Procedure: UPPER ESOPHAGEAL ENDOSCOPIC ULTRASOUND (EUS);  Surgeon: Carol Ada, MD;  Location: [facility];  Service: Endoscopy;  Laterality: N/A;    I have reviewed the social history and family history with the patient and they are unchanged from previous note.  ALLERGIES:  is allergic to erythromycin.  MEDICATIONS:  Current Outpatient Medications  Medication Sig Dispense Refill  . acetaminophen (TYLENOL) 500 MG tablet Take 1,000 mg by mouth 2 (two) times daily as needed for moderate pain or headache.    Marland Kitchen  amLODipine (NORVASC) 5 MG tablet Take 5 mg by mouth daily.     Marland Kitchen atenolol (TENORMIN) 25 MG tablet Take 25 mg by mouth daily. Once a day     . atorvastatin  (LIPITOR) 10 MG tablet Take 10 mg by mouth daily.    . Blood Glucose Monitoring Suppl (ACCU-CHEK NANO SMARTVIEW) W/DEVICE KIT 1 kit by Does not apply route 2 (two) times daily. (Patient taking differently: 1 kit by Does not apply route See admin instructions. Test blood sugars 12x's daily) 1 kit 0  . glucose blood test strip Test 3 times a day. (Patient taking differently: 1 each by Other route See admin instructions. Test 12 times a day.) 300 each Prn  . insulin NPH Human (NOVOLIN N) 100 UNIT/ML injection Inject 20 Units into the skin at bedtime.     . insulin regular (NOVOLIN R) 100 units/mL injection Inject 20 Units into the skin 3 (three) times daily before meals.     . lidocaine-prilocaine (EMLA) cream Apply to affected area once 30 g 3  . lisinopril (ZESTRIL) 20 MG tablet Take 20 mg by mouth daily.     Marland Kitchen LORazepam (ATIVAN) 0.5 MG tablet Take 0.5-1 tablets (0.25-0.5 mg total) by mouth every 8 (eight) hours as needed (nausea and vomiting). 30 tablet 0  . magnesium oxide (MAG-OX) 400 MG tablet TAKE 1 TABLET BY MOUTH EVERY DAY 90 tablet 1  . mirtazapine (REMERON) 7.5 MG tablet TAKE 1 TABLET (7.5 MG TOTAL) BY MOUTH AT BEDTIME. 90 tablet 1  . ondansetron (ZOFRAN) 8 MG tablet TAKE 1 TABLET BY MOUTH 2 TIMES DAY AS NEEDED. START ON 3RD DAY AFTER CHEMOTHERAPY 30 tablet 1  . prochlorperazine (COMPAZINE) 10 MG tablet TAKE 1 TABLET (10 MG TOTAL) BY MOUTH EVERY 6 (SIX) HOURS AS NEEDED (NAUSEA OR VOMITING). 30 tablet 1  . zolpidem (AMBIEN) 5 MG tablet Take 1-2 tablets (5-10 mg total) by mouth at bedtime as needed for sleep. 60 tablet 0  . clonazePAM (KLONOPIN) 0.5 MG tablet     . levofloxacin (LEVAQUIN) 750 MG tablet Take 1 tablet (750 mg total) by mouth daily. 7 tablet 0  . metFORMIN (GLUCOPHAGE) 1000 MG tablet Take 2,000 mg by mouth every evening.     Marland Kitchen oxycodone (OXY-IR) 5 MG capsule Take 1 capsule (5 mg total) by mouth every 6 (six) hours as needed for pain (severe pain). 20 capsule 0   Current  Facility-Administered Medications  Medication Dose Route Frequency Provider Last Rate Last Admin  . 0.9 %  sodium chloride infusion   Intravenous Once Alla Feeling, NP 500 mL/hr at 02/10/20 3818 New Bag at 02/10/20 0906   Facility-Administered Medications Ordered in Other Visits  Medication Dose Route Frequency Provider Last Rate Last Admin  . 0.9 %  sodium chloride infusion (Manually program via Guardrails IV Fluids)  250 mL Intravenous Once [name], MD      . gemcitabine (GEMZAR) 1,444 mg in sodium chloride 0.9 % 250 mL chemo infusion  800 mg/m2 (Treatment Plan Recorded) Intravenous Once [name], MD      . heparin lock flush 100 unit/mL  500 Units Intracatheter Once PRN [name], MD      . sodium chloride flush (NS) 0.9 % injection 10 mL  10 mL Intracatheter PRN [name], MD        PHYSICAL EXAMINATION: ECOG PERFORMANCE STATUS: 1 - Symptomatic but completely ambulatory  Vitals:   02/10/20 0817  BP: (!) 142/77  Pulse: 89  Resp:  20  Temp: 98.3 F (36.8 C)  SpO2: 99%   Filed Weights   02/10/20 0817  Weight: 151 lb 3.2 oz (68.6 kg)    GENERAL:alert, no distress and comfortable SKIN: no rash  EYES:  sclera clear OROPHARYNX: no thrush or ulcers LUNGS: clear with normal breathing effort HEART: regular rate & rhythm, no lower extremity edema NEURO: alert & oriented x 3 with fluent speech, normal gait PAC without erythema   LABORATORY DATA:  I have reviewed the data as listed CBC Latest Ref Rng & Units 02/10/2020 01/27/2020 01/20/2020  WBC 4.0 - 10.5 K/uL 9.6 5.3 7.0  Hemoglobin 13.0 - 17.0 g/dL 8.7(L) 8.4(L) 8.7(L)  Hematocrit 39.0 - 52.0 % 26.9(L) 26.1(L) 27.3(L)  Platelets 150 - 400 K/uL 353 286 290     CMP Latest Ref Rng & Units 02/10/2020 01/27/2020 01/20/2020  Glucose 70 - 99 mg/dL 160(H) 55(L) 111(H)  BUN 8 - 23 mg/dL 16 18 15   Creatinine 0.61 - 1.24 mg/dL 1.63(H) 1.35(H) 1.37(H)  Sodium 135 - 145 mmol/L 138 142 139  Potassium 3.5 - 5.1 mmol/L 4.8 4.0 4.3   Chloride 98 - 111 mmol/L 103 105 105  CO2 22 - 32 mmol/L 23 27 27   Calcium 8.9 - 10.3 mg/dL 9.0 9.4 9.3  Total Protein 6.5 - 8.1 g/dL 7.2 7.4 7.4  Total Bilirubin 0.3 - 1.2 mg/dL 0.4 0.4 0.4  Alkaline Phos 38 - 126 U/L 60 54 54  AST 15 - 41 U/L 19 19 16   ALT 0 - 44 U/L 12 14 9       RADIOGRAPHIC STUDIES: I have personally reviewed the radiological images as listed and agreed with the findings in the report. No results found.   ASSESSMENT & PLAN: NAHEIM BURGEN a 74y.o.malewith   1. Extrahepatic cholangiocarcinoma, cTxN0M0 -He wasdiagnosedin 12/2018; presented withmalignant stricture and CBD obstruction required stenting. Brushing revealed adenocarcinoma. He underwent attempted whipple [facility] Zenia Resides [facility] which was aborted due to vascular invasion - [facility] -He was referred to our clinic to initiate chemotherapy in the neoadjuvant setting. -He proceeded with neoadjuvantgemcitabine and cisplatin on days 1 and 8 every 21 daysstarting 03/12/19. Abraxane added with cycle 2.AddedGCSF ([name])to day 9 startingcycle 2. -Hetolerated first few cycles very well without significant side effects;tumor marker CA 19.9dropped significantly as he started chemo, indicating good response to chemo treatment -his performance status decreasedandabraxane was held with cycle 4  -restaging CT CAP on 10/20/19 showed stable disease in the liverbut with increased right pleural effusion and nodularity along the pleura, concerncing for pleural metastasis.  -He underwent diagnostic and therapeutic thoracentesis on 10/29/19, path showed reactive mesothelial cells -PET from 11/17/19 shows right pleural effusion. No evidence of hypermetabolic metastasis, although plural mets not ruled out.  -Per Dr. Carlis Abbott on 2/22 he is not a surgical candidate due to decreased PS.  -He continued cisplatin and gemcitabine 2 weeks on/1 week off with [name] with total 13 cycles, tolerated  mostly well except increased fatigue. He received single agent gemcitabine for cycle 14 and 15 -CT CAP from 5/6 shows stable disease, continue single agent gemcitabine   2.CINV, constipation -nausea worsened secondary to chemotherapy and recent constipation episode in 08/2019  -low dose ativan was added for increased n/v if zofran and compazine are adequate -will try to avoid podecadrondue to DM -mild lately, well controlled   3. H/o Acute cholecystitis -Hospitalizedon 05/03/19; s/ppercutaneous gallbladder drainage tube placed by IR8/5/20. -completed antibiotics -tube exchanged 05/18/19 due to leakagewhich  has resolved, his abdominal pain improved until it began leaking again on 9/8. IR could not exchange the tube as the gallbladder was completely decompressed and it was removed -[place] on 06/18/19 showed no evidence of cholecystitis -no clinical evidence of recurrent cholecystitis today  4. Weight lossand low appetite -secondary to malignancyand chemo;he presented with 20 lbs weight loss  -Continue to f/u with dieticianand supplements  -His appetitehas dropped significantly when he hadcholecystitis.  -Recently started Mirtazapine which is helping   5. DM -managed by PCP Dr. Theda Sers -he has had DM for 20 years, he is compliant with regimen and knows how to adjust BG -on insulin, he knows how to adjust dose based on his BG level -stable   6. CAD, HL, HTN -followed by cardiologist Dr. Ellyn Hack -on amlodipine, atenolol, lisinopril, and statin -stable  7. Social support -he is single, no children, he lives alone.He has a good friend whochecks on him -followed by SW  8.Insomnia -He is able to fall asleep but has trouble staying asleep -tried Z-quil and ambien but stopped due to lack of efficacy -Recently started mirtazapine which is helping   Disposition:  Mr. Risdon appears stable. He has completed 15 cycles of chemotherapy. He tolerates single agent  gemcitabine for past 2 cycles better, with improved fatigue and exertional dyspnea. He is able to recover well. He has minimal pain, mostly controlled with tylenol but not always. I refilled his oxycodone.   I reviewed his restaging CT CAP from 02/04/20 which shows stable biliary wall thickening, stable CBD stent, decreased right pleural effusion, and no evidence of progressive disease. There are tiny clustered tree-in-bud nodules in left lobe whoch are felt more likely inflammatory. Will monitor on future imaging. Overall stable disease. I reviewed with Dr. Burr Medico and we are recommending to continue single agent gemcitabine on days 1 and 8 every 21 days.   Labs reviewed, CA 19-9 is stable lately. CBC is stable, CMP shows worsening renal function Cr 1.63. He will get additional 500 cc NS with treatment today. I encouraged him to increase po liquids. Overall adequate to proceed with cycle 16 day 1 today.   He will return for lab on 5/17 for dental clearance, we will fax the form to his office before scheduled extraction on 5/18. He will then return for f/u and day 8 next week.   All questions were answered. The patient knows to call the clinic with any problems, questions or concerns. No barriers to learning was detected. Total encounter time was 30 minutes.      Alla Feeling, NP 02/10/20

## 2020-02-10 ENCOUNTER — Encounter: Payer: Self-pay | Admitting: Nurse Practitioner

## 2020-02-10 ENCOUNTER — Inpatient Hospital Stay: Payer: Medicare Other

## 2020-02-10 ENCOUNTER — Other Ambulatory Visit: Payer: Self-pay

## 2020-02-10 ENCOUNTER — Inpatient Hospital Stay (HOSPITAL_BASED_OUTPATIENT_CLINIC_OR_DEPARTMENT_OTHER): Payer: Medicare Other | Admitting: Nurse Practitioner

## 2020-02-10 ENCOUNTER — Ambulatory Visit: Payer: Medicare Other | Admitting: Hematology

## 2020-02-10 VITALS — BP 142/77 | HR 89 | Temp 98.3°F | Resp 20 | Ht 67.0 in | Wt 151.2 lb

## 2020-02-10 DIAGNOSIS — G4700 Insomnia, unspecified: Secondary | ICD-10-CM | POA: Diagnosis not present

## 2020-02-10 DIAGNOSIS — C240 Malignant neoplasm of extrahepatic bile duct: Secondary | ICD-10-CM | POA: Diagnosis present

## 2020-02-10 DIAGNOSIS — K5900 Constipation, unspecified: Secondary | ICD-10-CM | POA: Diagnosis not present

## 2020-02-10 DIAGNOSIS — C221 Intrahepatic bile duct carcinoma: Secondary | ICD-10-CM

## 2020-02-10 DIAGNOSIS — E1040 Type 1 diabetes mellitus with diabetic neuropathy, unspecified: Secondary | ICD-10-CM | POA: Diagnosis not present

## 2020-02-10 DIAGNOSIS — Z794 Long term (current) use of insulin: Secondary | ICD-10-CM | POA: Diagnosis not present

## 2020-02-10 DIAGNOSIS — Z8719 Personal history of other diseases of the digestive system: Secondary | ICD-10-CM | POA: Diagnosis not present

## 2020-02-10 DIAGNOSIS — I2510 Atherosclerotic heart disease of native coronary artery without angina pectoris: Secondary | ICD-10-CM | POA: Diagnosis not present

## 2020-02-10 DIAGNOSIS — I10 Essential (primary) hypertension: Secondary | ICD-10-CM | POA: Diagnosis not present

## 2020-02-10 DIAGNOSIS — R202 Paresthesia of skin: Secondary | ICD-10-CM | POA: Diagnosis not present

## 2020-02-10 DIAGNOSIS — Z5111 Encounter for antineoplastic chemotherapy: Secondary | ICD-10-CM | POA: Diagnosis present

## 2020-02-10 LAB — CMP (CANCER CENTER ONLY)
ALT: 12 U/L (ref 0–44)
AST: 19 U/L (ref 15–41)
Albumin: 3.3 g/dL — ABNORMAL LOW (ref 3.5–5.0)
Alkaline Phosphatase: 60 U/L (ref 38–126)
Anion gap: 12 (ref 5–15)
BUN: 16 mg/dL (ref 8–23)
CO2: 23 mmol/L (ref 22–32)
Calcium: 9 mg/dL (ref 8.9–10.3)
Chloride: 103 mmol/L (ref 98–111)
Creatinine: 1.63 mg/dL — ABNORMAL HIGH (ref 0.61–1.24)
GFR, Est AFR Am: 48 mL/min — ABNORMAL LOW (ref >60)
GFR, Estimated: 41 mL/min — ABNORMAL LOW (ref >60)
Glucose, Bld: 160 mg/dL — ABNORMAL HIGH (ref 70–99)
Potassium: 4.8 mmol/L (ref 3.5–5.1)
Sodium: 138 mmol/L (ref 135–145)
Total Bilirubin: 0.4 mg/dL (ref 0.3–1.2)
Total Protein: 7.2 g/dL (ref 6.5–8.1)

## 2020-02-10 LAB — MAGNESIUM: Magnesium: 1.9 mg/dL (ref 1.7–2.4)

## 2020-02-10 LAB — CBC WITH DIFFERENTIAL (CANCER CENTER ONLY)
Abs Immature Granulocytes: 0.03 10*3/uL (ref 0.00–0.07)
Basophils Absolute: 0 10*3/uL (ref 0.0–0.1)
Basophils Relative: 0 %
Eosinophils Absolute: 0.1 10*3/uL (ref 0.0–0.5)
Eosinophils Relative: 1 %
HCT: 26.9 % — ABNORMAL LOW (ref 39.0–52.0)
Hemoglobin: 8.7 g/dL — ABNORMAL LOW (ref 13.0–17.0)
Immature Granulocytes: 0 %
Lymphocytes Relative: 21 %
Lymphs Abs: 2 10*3/uL (ref 0.7–4.0)
MCH: 30.3 pg (ref 26.0–34.0)
MCHC: 32.3 g/dL (ref 30.0–36.0)
MCV: 93.7 fL (ref 80.0–100.0)
Monocytes Absolute: 1.5 10*3/uL — ABNORMAL HIGH (ref 0.1–1.0)
Monocytes Relative: 16 %
Neutro Abs: 5.9 10*3/uL (ref 1.7–7.7)
Neutrophils Relative %: 62 %
Platelet Count: 353 10*3/uL (ref 150–400)
RBC: 2.87 MIL/uL — ABNORMAL LOW (ref 4.22–5.81)
RDW: 18.4 % — ABNORMAL HIGH (ref 11.5–15.5)
WBC Count: 9.6 10*3/uL (ref 4.0–10.5)
nRBC: 0 % (ref 0.0–0.2)

## 2020-02-10 MED ORDER — PROCHLORPERAZINE MALEATE 10 MG PO TABS
10.0000 mg | ORAL_TABLET | Freq: Once | ORAL | Status: AC
  Administered 2020-02-10: 10 mg via ORAL

## 2020-02-10 MED ORDER — SODIUM CHLORIDE 0.9 % IV SOLN
Freq: Once | INTRAVENOUS | Status: AC
  Filled 2020-02-10: qty 250

## 2020-02-10 MED ORDER — SODIUM CHLORIDE 0.9% FLUSH
10.0000 mL | INTRAVENOUS | Status: DC | PRN
  Administered 2020-02-10: 10 mL
  Filled 2020-02-10: qty 10

## 2020-02-10 MED ORDER — OXYCODONE HCL 5 MG PO CAPS: 5.0000 mg | ORAL_CAPSULE | Freq: Four times a day (QID) | ORAL | 0 refills | Status: DC | PRN

## 2020-02-10 MED ORDER — HEPARIN SOD (PORK) LOCK FLUSH 100 UNIT/ML IV SOLN
500.0000 [IU] | Freq: Once | INTRAVENOUS | Status: AC | PRN
  Administered 2020-02-10: 500 [IU]
  Filled 2020-02-10: qty 5

## 2020-02-10 MED ORDER — SODIUM CHLORIDE 0.9 % IV SOLN
800.0000 mg/m2 | Freq: Once | INTRAVENOUS | Status: AC
  Administered 2020-02-10: 1444 mg via INTRAVENOUS
  Filled 2020-02-10: qty 37.98

## 2020-02-10 MED ORDER — PROCHLORPERAZINE MALEATE 10 MG PO TABS
ORAL_TABLET | ORAL | Status: AC
  Filled 2020-02-10: qty 1

## 2020-02-10 NOTE — Progress Notes (Signed)
Per Cira Rue, NP, ok to treat with creatinine 1.63 today. Will add additional IVF per her orders.

## 2020-02-10 NOTE — Patient Instructions (Signed)
[facility] Discharge Instructions for Patients Receiving Chemotherapy  Today you received the following chemotherapy agents: gemcitabine.  To help prevent nausea and vomiting after your treatment, we encourage you to take your nausea medication as directed.   If you develop nausea and vomiting that is not controlled by your nausea medication, call the clinic.   BELOW ARE SYMPTOMS THAT SHOULD BE REPORTED IMMEDIATELY:  *FEVER GREATER THAN 100.5 F  *CHILLS WITH OR WITHOUT FEVER  NAUSEA AND VOMITING THAT IS NOT CONTROLLED WITH YOUR NAUSEA MEDICATION  *UNUSUAL SHORTNESS OF BREATH  *UNUSUAL BRUISING OR BLEEDING  TENDERNESS IN MOUTH AND THROAT WITH OR WITHOUT PRESENCE OF ULCERS  *URINARY PROBLEMS  *BOWEL PROBLEMS  UNUSUAL RASH Items with * indicate a potential emergency and should be followed up as soon as possible.  Feel free to call the clinic should you have any questions or concerns. The clinic phone number is [phone].  Please show the CHEMO ALERT CARD at check-in to the Emergency Department and triage nurse.   

## 2020-02-10 NOTE — Addendum Note (Signed)
Addended by: Alla Feeling on: 02/10/2020 09:55 AM   Modules accepted: Orders

## 2020-02-11 ENCOUNTER — Telehealth: Payer: Self-pay | Admitting: Nurse Practitioner

## 2020-02-11 NOTE — Telephone Encounter (Signed)
Scheduled appt per 5/13 los.  Pt will get a print out at their next scheduled appt that was already scheduled.

## 2020-02-11 NOTE — Progress Notes (Signed)
Pharmacist Chemotherapy Monitoring - Follow Up Assessment    I verify that I have reviewed each item in the below checklist:  . Regimen for the patient is scheduled for the appropriate day and plan matches scheduled date. Marland Kitchen Appropriate non-routine labs are ordered dependent on drug ordered. . If applicable, additional medications reviewed and ordered per protocol based on lifetime cumulative doses and/or treatment regimen.   Plan for follow-up and/or issues identified: No . I-vent associated with next due treatment: No . MD and/or nursing notified: No  Mauro Arps D 02/11/2020 9:18 AM

## 2020-02-14 ENCOUNTER — Inpatient Hospital Stay: Payer: Medicare Other

## 2020-02-14 ENCOUNTER — Other Ambulatory Visit: Payer: Self-pay

## 2020-02-14 ENCOUNTER — Encounter: Payer: Self-pay | Admitting: Hematology

## 2020-02-14 DIAGNOSIS — Z5111 Encounter for antineoplastic chemotherapy: Secondary | ICD-10-CM | POA: Diagnosis not present

## 2020-02-14 DIAGNOSIS — Z95828 Presence of other vascular implants and grafts: Secondary | ICD-10-CM

## 2020-02-14 DIAGNOSIS — C221 Intrahepatic bile duct carcinoma: Secondary | ICD-10-CM

## 2020-02-14 LAB — CMP (CANCER CENTER ONLY)
ALT: 29 U/L (ref 0–44)
AST: 32 U/L (ref 15–41)
Albumin: 3 g/dL — ABNORMAL LOW (ref 3.5–5.0)
Alkaline Phosphatase: 52 U/L (ref 38–126)
Anion gap: 9 (ref 5–15)
BUN: 24 mg/dL — ABNORMAL HIGH (ref 8–23)
CO2: 24 mmol/L (ref 22–32)
Calcium: 9 mg/dL (ref 8.9–10.3)
Chloride: 103 mmol/L (ref 98–111)
Creatinine: 1.42 mg/dL — ABNORMAL HIGH (ref 0.61–1.24)
GFR, Est AFR Am: 57 mL/min — ABNORMAL LOW (ref >60)
GFR, Estimated: 49 mL/min — ABNORMAL LOW (ref >60)
Glucose, Bld: 169 mg/dL — ABNORMAL HIGH (ref 70–99)
Potassium: 4.2 mmol/L (ref 3.5–5.1)
Sodium: 136 mmol/L (ref 135–145)
Total Bilirubin: 0.5 mg/dL (ref 0.3–1.2)
Total Protein: 7.1 g/dL (ref 6.5–8.1)

## 2020-02-14 LAB — CBC WITH DIFFERENTIAL (CANCER CENTER ONLY)
Abs Immature Granulocytes: 0.02 10*3/uL (ref 0.00–0.07)
Basophils Absolute: 0 10*3/uL (ref 0.0–0.1)
Basophils Relative: 1 %
Eosinophils Absolute: 0.1 10*3/uL (ref 0.0–0.5)
Eosinophils Relative: 2 %
HCT: 24.5 % — ABNORMAL LOW (ref 39.0–52.0)
Hemoglobin: 7.9 g/dL — ABNORMAL LOW (ref 13.0–17.0)
Immature Granulocytes: 0 %
Lymphocytes Relative: 27 %
Lymphs Abs: 1.3 10*3/uL (ref 0.7–4.0)
MCH: 29.7 pg (ref 26.0–34.0)
MCHC: 32.2 g/dL (ref 30.0–36.0)
MCV: 92.1 fL (ref 80.0–100.0)
Monocytes Absolute: 0.2 10*3/uL (ref 0.1–1.0)
Monocytes Relative: 4 %
Neutro Abs: 3.2 10*3/uL (ref 1.7–7.7)
Neutrophils Relative %: 66 %
Platelet Count: 322 10*3/uL (ref 150–400)
RBC: 2.66 MIL/uL — ABNORMAL LOW (ref 4.22–5.81)
RDW: 17.6 % — ABNORMAL HIGH (ref 11.5–15.5)
WBC Count: 4.8 10*3/uL (ref 4.0–10.5)
nRBC: 0 % (ref 0.0–0.2)

## 2020-02-14 LAB — MAGNESIUM: Magnesium: 2.1 mg/dL (ref 1.7–2.4)

## 2020-02-14 MED ORDER — HEPARIN SOD (PORK) LOCK FLUSH 100 UNIT/ML IV SOLN
500.0000 [IU] | Freq: Once | INTRAVENOUS | Status: AC | PRN
  Administered 2020-02-14: 500 [IU]
  Filled 2020-02-14: qty 5

## 2020-02-14 MED ORDER — SODIUM CHLORIDE 0.9% FLUSH
10.0000 mL | INTRAVENOUS | Status: DC | PRN
  Administered 2020-02-14: 10 mL
  Filled 2020-02-14: qty 10

## 2020-02-14 NOTE — Patient Instructions (Signed)

## 2020-02-15 LAB — CANCER ANTIGEN 19-9: CA 19-9: 72 U/mL — ABNORMAL HIGH (ref 0–35)

## 2020-02-16 NOTE — Progress Notes (Signed)
[place]   [phone] [phone]   Clinic Follow up Note   Patient Care Team: [name], DO as PCP - General (Family Medicine) 02/17/2020  CHIEF COMPLAINT: F/u extrahepatic cholangiocarcinoma   SUMMARY OF ONCOLOGIC HISTORY: Oncology History  Cholangiocarcinoma ([place])  01/14/2019 Imaging   US Abdomen 01/14/19  IMPRESSION: There is significant intrahepatic and extrahepatic biliary dilatation present, concerning for distal common bile duct obstruction. Further evaluation with CT scan or MRCP is recommended. Correlation with liver function tests is recommended as well. These results will be called to the ordering clinician or representative by the Radiologist Assistant, and communication documented in the PACS or zVision Dashboard.   Probable large amount of sludge seen within gallbladder lumen with mild gallbladder wall thickening. However, no cholelithiasis, pericholecystic fluid or sonographic Murphy's sign is noted.   4.2 cm septated cyst seen in upper pole of right kidney consistent with Bosniak type 2 lesion. Follow-up ultrasound in 1 year is recommended to ensure stability.   01/20/2019 Imaging   MRI abdomen 01/20/19  IMPRESSION: 1. Findings are highly concerning for central tumor in the biliary tract at the confluence of the common hepatic duct, cystic duct and common bile ducts. Further clinical evaluation for potential cholangiocarcinoma is strongly recommended. 2. Mild ductal dilatation of the main pancreatic duct throughout the distal body and tail of the pancreas where there is also some associated side branch ectasia. This may suggest a pancreatic ductal stricture. No obstructing pancreatic neoplasm identified. 3. Aortic atherosclerosis.   01/21/2019 Initial Biopsy   Diagnosis 01/21/19  BILE DUCT BRUSHING (SPECIMEN 1 OF 1 COLLECTED 01/21/2019) ADENOCARCINOMA.   01/21/2019 Procedure   ERCP By Dr hung 01/21/19 IMRPESSION -  The major papilla appeared normal. - A single localized biliary stricture was found in the middle third of the main bile duct. - The entire main bile duct and upper third of the main bile duct were dilated, secondary to a stricture. - A biliary sphincterotomy was performed. - Cells for cytology obtained in the middle third of the main bile duct. - One plastic stent was placed into the common bile duct.  EUS by Dr hung 01/21/19  IMPRESSION - There was dilation in the middle third of the main bile duct and in the upper third of the main bile duct which measured up to 15 mm. - There was a suggestion of a stricture in the middle third of the main bile duct. - No specimens collected.   02/03/2019 Imaging   CT CAP at [facility] 02/03/19  Impression:  1. There is mild intrahepatic and extrahepatic biliary ductal dilation and diffuse common bile duct wall thickening with stent in place. There is abnormal soft tissue measuring approximately 1.6 cm between the common hepatic artery, portal vein and common bile duct, worrisome for the known cholangiocarcinoma.  2. Periportal lymphadenopathy which may represent nodal metastasis.  4. Marked pancreatic atrophy and duct dilation involving the distal body and tail the pancreas where there is a coarse calcification. These findings are favored to represent stenosis from intraductal stones though an underlying stricture cannot be completely excluded.  5. Atypical symmetric prominent fat stranding of the bilateral lower abdominal wall. Correlate with surgical history or history of history of trauma.   03/08/2019 Initial Diagnosis   Cholangiocarcinoma ([place])   03/12/2019 -  Chemotherapy   Gemcitabine and cisplatin on days 1 and 8 every 21 days starting 03/12/19. Abraxane added with cycle 2. Added GCSF ([name]) to day 9 starting cycle  2. Chemo held since 06/03/19 due to poor toleration. Chemo restarted with Gem/Cis with C6 on 07/02/19.    03/19/2019 Cancer Staging     Staging form: Perihilar Bile Ducts, AJCC 8th Edition - Clinical: Stage Unknown (cTX, cN0, cM0) - Signed by [name], MD on 03/19/2019   05/24/2019 Imaging   CT CAP W Contrast at [facility]  1.  Interval placement of covered metallic biliary stent. No progressive dilation of the biliary tree and resulting resolution of the prior main pancreatic duct dilation. 2.  Evidence of some subtle vascular contour deformity involving the hepatic artery, suspected to be from tumoral contact and/or posttreatment changes. 3.  New small right pleural effusion with overlying mild airspace disease. Secondary findings which may indicate a component of aspiration. 4.  No convincing evidence of new disseminated metastatic disease. 5.  Additional findings as discussed above.   06/18/2019 Imaging   CT AP IMPRESSION: 1.  No acute findings in the abdomen/pelvis. 2. Biliary stent in adequate position. No evidence of focal mass or adenopathy in the region of the pancreatic head or porta hepatis. 3. Moderate size right pleural effusion with associated right basilar atelectasis. 4. Possible single gallstone. Stable subcentimeter liver cyst. Stable right renal cysts. Small left inguinal hernia containing only peritoneal fat. 5. Aortic Atherosclerosis (ICD10-I70.0).   07/28/2019 Imaging   CT CAP IMPRESSION: 1. The patient is status post common bile duct stent placement, with unchanged stent position and patent appearance with pneumobilia. Primary cholangiocarcinoma is not discretely appreciated by CT. 2. No direct evidence of metastatic disease in the chest, abdomen, or pelvis. 3. Moderate right pleural effusion with associated atelectasis or consolidation, slightly improved compared to prior examination. 4. The pancreatic parenchyma is atrophic and calcified, particularly in the pancreatic tail. 5.  Coronary artery disease.  Aortic atherosclerosis   10/20/2019 PET scan   IMPRESSION: 1. Redemonstrated post  treatment and post stenting appearance of a central cholangiocarcinoma, which is not directly appreciated by CT. Common bile duct stent remains in position with post stenting pneumobilia. 2. Slight interval increase in a moderate to large right pleural effusion with associated atelectasis or consolidation. There may be some pleural nodularity about the right hemidiaphragm which appears new compared to prior examination (series 2, image 40), highly suspicious for pleural metastatic disease. 3. No direct evidence of metastatic disease in the chest, abdomen, or pelvis. 4. Coronary artery disease.  Aortic Atherosclerosis (ICD10-I70.0).     10/29/2019 Pathology Results   Thoracentesis FINAL MICROSCOPIC DIAGNOSIS:  - Reactive mesothelial cells present    11/17/2019 PET scan   IMPRESSION: 1. Low-level hypermetabolism within the right pleural space, without focal abnormality to strongly suggest pleural metastasis. 2. Hypermetabolism along the course of the common duct stent, nonspecific and most likely reactive. 3. Otherwise, no evidence of hypermetabolic metastasis. 4. Small to moderate right pleural effusion with developing loculation superiorly. 5. Coronary artery atherosclerosis. Aortic Atherosclerosis (ICD10-I70.0). 6.  Possible constipation.     02/03/2020 Imaging   CT CAP IMPRESSION: 1. Stable indistinct circumferential biliary wall thickening in the proximal CBD, without discrete biliary mass. Stable well-positioned CBD stent with intrahepatic pneumobilia indicating patency. 2. No findings of metastatic disease in the abdomen or pelvis. 3. Small dependent right pleural effusion is decreased. Mild diffuse smooth right pleural thickening is similar to slightly increased, nonspecific, cannot exclude malignant pleural effusion. No discrete pleural nodularity. 4. Tiny clustered tree-in-bud type nodules in the anterior left upper lobe, slightly increased, nonspecific, more  likely inflammatory. Recommend attention on  follow-up chest CT in 3 months. 5. Chronic findings include: Chronic pancreatitis. Coronary atherosclerosis. Aortic Atherosclerosis (ICD10-I70.0).       CURRENT THERAPY:  Gemcitabine and cisplatin on days 1 and 8 every 21 daysstarting 03/12/19. Abraxane added with cycle 2.AddedGCSF ([name])to day 9 startingcycle 2.Cisplatin removed starting from cycle13due to renal insufficiency.   INTERVAL HISTORY: Christopher Welch returns for f/u and day 8 chemo as scheduled. He completed C16D1 gemcitabine on 02/10/20. He had low energy and appetite and felt "yucky" on days 2 and 3, then feels better. He had 2 teeth extracted on 5/18, on amoxacillin TID. He has been taking oxycodone for oral pain q6-7 hours since Tuesday which is helping. Now just a mild throb. He is eating soft diet. Bowels have not moved since 5/16, he has not taken anything yet. Denies n/v. Denies other mucositis. Denies fever, chills, cough, chest pain, or worsening DOE. Neuropathy at baseline. His "usual" pain which occurs post prandial is unchanged. Urine is lighter lately and stool is normal color.    MEDICAL HISTORY:  Past Medical History:  Diagnosis Date  . Diabetes mellitus type I, controlled ([name])   . Dyslipidemia   . ED (erectile dysfunction)   . Essential hypertension   . Gilbert's disease   . Hepatitis B carrier [facility])     SURGICAL HISTORY: Past Surgical History:  Procedure Laterality Date  . APPENDECTOMY  1964  . BILIARY BRUSHING  01/21/2019   Procedure: BILIARY BRUSHING;  Surgeon: Carol Ada, MD;  Location: [facility];  Service: Endoscopy;;  . BILIARY STENT PLACEMENT N/A 01/21/2019   Procedure: BILIARY STENT PLACEMENT;  Surgeon: Carol Ada, MD;  Location: [facility];  Service: Endoscopy;  Laterality: N/A;  . ENDOSCOPIC RETROGRADE CHOLANGIOPANCREATOGRAPHY (ERCP) WITH PROPOFOL N/A 01/21/2019   Procedure: ENDOSCOPIC RETROGRADE CHOLANGIOPANCREATOGRAPHY (ERCP) WITH  PROPOFOL;  Surgeon: Carol Ada, MD;  Location: [facility];  Service: Endoscopy;  Laterality: N/A;  . ESOPHAGOGASTRODUODENOSCOPY (EGD) WITH PROPOFOL N/A 01/21/2019   Procedure: ESOPHAGOGASTRODUODENOSCOPY (EGD) WITH PROPOFOL;  Surgeon: Carol Ada, MD;  Location: [facility];  Service: Endoscopy;  Laterality: N/A;  . FEMORAL HERNIA REPAIR    . IR CHOLANGIOGRAM EXISTING TUBE  05/13/2019  . IR CHOLANGIOGRAM EXISTING TUBE  06/08/2019  . IR EXCHANGE BILIARY DRAIN  05/18/2019  . IR EXCHANGE BILIARY DRAIN  06/08/2019  . IR IMAGING GUIDED PORT INSERTION  03/08/2019  . IR PERC CHOLECYSTOSTOMY  05/04/2019  . IR THORACENTESIS ASP PLEURAL SPACE W/IMG GUIDE  10/29/2019  . LEFT HEART CATH AND CORONARY ANGIOGRAPHY N/A 05/19/2017   Procedure: LEFT HEART CATH AND CORONARY ANGIOGRAPHY;  Surgeon: Leonie Man, MD;  Location: [place] CV LAB;  Service: Cardiovascular;  Laterality: N/A;  . SPHINCTEROTOMY  01/21/2019   Procedure: SPHINCTEROTOMY;  Surgeon: Carol Ada, MD;  Location: [facility];  Service: Endoscopy;;  . UPPER ESOPHAGEAL ENDOSCOPIC ULTRASOUND (EUS) N/A 01/21/2019   Procedure: UPPER ESOPHAGEAL ENDOSCOPIC ULTRASOUND (EUS);  Surgeon: Carol Ada, MD;  Location: [facility];  Service: Endoscopy;  Laterality: N/A;    I have reviewed the social history and family history with the patient and they are unchanged from previous note.  ALLERGIES:  is allergic to erythromycin.  MEDICATIONS:  Current Outpatient Medications  Medication Sig Dispense Refill  . acetaminophen (TYLENOL) 500 MG tablet Take 1,000 mg by mouth 2 (two) times daily as needed for moderate pain or headache.    Marland Kitchen amLODipine (NORVASC) 5 MG tablet Take 5 mg by mouth daily.     Marland Kitchen atenolol (TENORMIN) 25 MG tablet  Take 25 mg by mouth daily. Once a day     . atorvastatin (LIPITOR) 10 MG tablet Take 10 mg by mouth daily.    . Blood Glucose Monitoring Suppl (ACCU-CHEK NANO SMARTVIEW) W/DEVICE KIT 1 kit by Does not apply route 2 (two) times  daily. (Patient taking differently: 1 kit by Does not apply route See admin instructions. Test blood sugars 12x's daily) 1 kit 0  . glucose blood test strip Test 3 times a day. (Patient taking differently: 1 each by Other route See admin instructions. Test 12 times a day.) 300 each Prn  . insulin NPH Human (NOVOLIN N) 100 UNIT/ML injection Inject 20 Units into the skin at bedtime.     . insulin regular (NOVOLIN R) 100 units/mL injection Inject 20 Units into the skin 3 (three) times daily before meals.     Marland Kitchen levofloxacin (LEVAQUIN) 750 MG tablet Take 1 tablet (750 mg total) by mouth daily. 7 tablet 0  . lidocaine-prilocaine (EMLA) cream Apply to affected area once 30 g 3  . lisinopril (ZESTRIL) 20 MG tablet Take 20 mg by mouth daily.     Marland Kitchen LORazepam (ATIVAN) 0.5 MG tablet Take 0.5-1 tablets (0.25-0.5 mg total) by mouth every 8 (eight) hours as needed (nausea and vomiting). 30 tablet 0  . magnesium oxide (MAG-OX) 400 MG tablet TAKE 1 TABLET BY MOUTH EVERY DAY 90 tablet 1  . mirtazapine (REMERON) 7.5 MG tablet TAKE 1 TABLET (7.5 MG TOTAL) BY MOUTH AT BEDTIME. 90 tablet 1  . ondansetron (ZOFRAN) 8 MG tablet TAKE 1 TABLET BY MOUTH 2 TIMES DAY AS NEEDED. START ON 3RD DAY AFTER CHEMOTHERAPY 30 tablet 1  . oxycodone (OXY-IR) 5 MG capsule Take 1 capsule (5 mg total) by mouth every 6 (six) hours as needed for pain (severe pain). 20 capsule 0  . prochlorperazine (COMPAZINE) 10 MG tablet TAKE 1 TABLET (10 MG TOTAL) BY MOUTH EVERY 6 (SIX) HOURS AS NEEDED (NAUSEA OR VOMITING). 30 tablet 1  . zolpidem (AMBIEN) 5 MG tablet Take 1-2 tablets (5-10 mg total) by mouth at bedtime as needed for sleep. 60 tablet 0  . clonazePAM (KLONOPIN) 0.5 MG tablet     . metFORMIN (GLUCOPHAGE) 1000 MG tablet Take 2,000 mg by mouth every evening.      Current Facility-Administered Medications  Medication Dose Route Frequency Provider Last Rate Last Admin  . 0.9 %  sodium chloride infusion   Intravenous Once Alla Feeling, NP 500  mL/hr at 02/17/20 4174 New Bag at 02/17/20 0904   Facility-Administered Medications Ordered in Other Visits  Medication Dose Route Frequency Provider Last Rate Last Admin  . 0.9 %  sodium chloride infusion (Manually program via Guardrails IV Fluids)  250 mL Intravenous Once [name], MD      . heparin lock flush 100 unit/mL  500 Units Intracatheter Once PRN [name], MD      . sodium chloride flush (NS) 0.9 % injection 10 mL  10 mL Intracatheter PRN [name], MD        PHYSICAL EXAMINATION: ECOG PERFORMANCE STATUS: 1 - Symptomatic but completely ambulatory  Vitals:   02/17/20 0819  BP: 137/67  Pulse: 89  Resp: 16  Temp: (!) 97.5 F (36.4 C)  SpO2: 99%   Filed Weights   02/17/20 0819  Weight: 148 lb 12.8 oz (67.5 kg)    GENERAL:alert, no distress and comfortable SKIN: no rash on exposed skin  EYES:  sclera clear OROPHARYNX: s/p 2 upper right lateral  extractions, sutures intact, no erythema, edema or signs of infection  LUNGS: clear with normal breathing effort HEART: regular rate & rhythm, no lower extremity edema ABDOMEN: soft, nontender with positive bowel sounds  NEURO: alert & oriented x 3 with fluent speech, normal gait PAC without erythema  Exam limited to visualization due to covid19 precautions and no concerns   LABORATORY DATA:  I have reviewed the data as listed CBC Latest Ref Rng & Units 02/17/2020 02/14/2020 02/10/2020  WBC 4.0 - 10.5 K/uL 5.2 4.8 9.6  Hemoglobin 13.0 - 17.0 g/dL 8.3(L) 7.9(L) 8.7(L)  Hematocrit 39.0 - 52.0 % 25.7(L) 24.5(L) 26.9(L)  Platelets 150 - 400 K/uL 286 322 353     CMP Latest Ref Rng & Units 02/17/2020 02/14/2020 02/10/2020  Glucose 70 - 99 mg/dL 282(H) 169(H) 160(H)  BUN 8 - 23 mg/dL 29(H) 24(H) 16  Creatinine 0.61 - 1.24 mg/dL 1.64(H) 1.42(H) 1.63(H)  Sodium 135 - 145 mmol/L 134(L) 136 138  Potassium 3.5 - 5.1 mmol/L 4.8 4.2 4.8  Chloride 98 - 111 mmol/L 98 103 103  CO2 22 - 32 mmol/L 26 24 23   Calcium 8.9 - 10.3 mg/dL 9.8 9.0  9.0  Total Protein 6.5 - 8.1 g/dL 7.3 7.1 7.2  Total Bilirubin 0.3 - 1.2 mg/dL 0.6 0.5 0.4  Alkaline Phos 38 - 126 U/L 175(H) 52 60  AST 15 - 41 U/L 195(HH) 32 19  ALT 0 - 44 U/L 91(H) 29 12      RADIOGRAPHIC STUDIES: I have personally reviewed the radiological images as listed and agreed with the findings in the report. No results found.   ASSESSMENT & PLAN: Christopher Welch a 74y.o.malewith   1. Acute transaminitis  -S/p cycle 16 day 1 chemo (gemcitabine) on 5/13, LFTs have been normal historically. Also normal on 5/17 when he returned for dental clearance.  -Today he has marked transaminitis AST 195, ALT 91, ALK phos 175; normal bili. No change in baseline abdominal pain. Physical exam is benign, not concerning for acute cholangitis -this is possibly medication induced, related to gemcitabine, tylenol, or possibly amoxicillin, pharmacy feels this is less likely -He is being referred for stat abd [place] to evaluate biliary stent  -Hold chemo, will hydrate today, avoid hepatotoxic meds, and r/s lab and chemo next week if LFTs improve -F/u next week    2. Extrahepatic cholangiocarcinoma, cTxN0M0 -He wasdiagnosedin 12/2018; presented withmalignant stricture and CBD obstruction required stenting. Brushing revealed adenocarcinoma. He underwent attempted [facility] which was aborted due to vascular invasion - [facility]Dr[facility]is [facility] -He was referred to our clinic to initiate chemotherapy in the neoadjuvant setting. -He proceeded with neoadjuvantgemcitabine and cisplatin on days 1 and 8 every 21 daysstarting 03/12/19. Abraxane added with cycle 2.AddedGCSF ([name])to day 9 startingcycle 2. -Hetolerated first few cycles very well without significant side effects;tumor marker CA 19.9dropped significantly as he started chemo, indicating good response to chemo treatment -his performance status decreasedandabraxane was held with cycle 4  -restaging CT CAP  on 10/20/19 showed stable disease in the liverbut with increased right pleural effusion and nodularity along the pleura, concerncing for pleural metastasis.  -He underwent diagnostic and therapeutic thoracentesis on 10/29/19, path showed reactive mesothelial cells -PET from 11/17/19 shows right pleural effusion. No evidence of hypermetabolic metastasis, although plural mets not ruled out.  -Per Dr. Carlis Abbott on 2/22 he is not a surgical candidate due to decreased PS.  -He continued cisplatin and gemcitabine 2 weeks on/1 week off  with Udenycawith total 13 cycles, tolerated mostly well except increased fatigue. He received single agent gemcitabine for cycle 14 and 15 -CT CAP from 5/6 shows stable disease, continue single agent gemcitabine. Tolerating well with mild fatigue and low appetite on days 2-3, able to recover well   3.CINV, constipation -nausea worsened secondary to chemotherapy and recent constipation episode in 08/2019, now mild -low dose ativan was added for increased n/vifzofran and compazine are adequate -will try to avoidpodecadrondue to DM -constipation worsened this week, likely due to increased oxycodone use for oral pain from extractions  -He will begin bowel regimen with colace and miralax BID until regular BM, then PRN.  4.H/oAcute cholecystitis -Hospitalizedon 05/03/19; s/ppercutaneous gallbladder drainage tube placed by IR8/5/20. -completed antibiotics -tube exchanged 8/18/20due to leakagewhich has resolved, his abdominal pain improved until it began leaking again on 9/8. IR could not exchange the tube as the gallbladder was completely decompressed and it was removed -[place] on 9/18/20showed no evidence of cholecystitis -no clinical evidence of recurrent cholecystitis today  5. Weight lossand low appetite -secondary to malignancyand chemo;he presented with 20 lbs weight loss  -Continue to f/u with dieticianand supplements  -His appetitehas dropped  significantly when he hadcholecystitis.  -Recently started Mirtazapine which is helping  6. DM -managed by PCP Dr. Theda Sers -he has had DM for 20 years, he is compliant with regimen and knows how to adjust BG -on insulin, he knows how to adjust dose based on his BG level -stable   7. CAD, HL, HTN -followed by cardiologist Dr. Ellyn Hack -on amlodipine, atenolol, lisinopril, and statin -may hold statin if LFTs do not improve  -stable  8. Social support -he is single, no children, he lives alone.He has a good friend whochecks on him -followed by SW  9.Insomnia -He is able to fall asleep but has trouble staying asleep -tried Z-quil and ambien but stopped due to lack of efficacy -Recently started mirtazapine which is helping   Disposition: Christopher Welch appears stable. He completed another cycle of single agent gemcitabine. He tolerates treatment well overall except low energy and appetite for 2-3 days. He is able to recover and function well.   He underwent 2 upper right oral extractions this week by his dentist at [facility]. He tolerated procedure well, taking amoxicillin TID as prescribed and oxycodone q6-7 hours PRN for oral pain. He later told my nurse he has been taking tylenol q6h for 4 days. No other new meds or supplements. Tolerating soft diet. He is constipated, likely due to increased opioids use. I recommend to wean down oxycodone and take colace and miralax BID until normal BM, then PRN. He agrees to start today.   He has marked transaminitis today, AST 195, ALT 91, ALK phos 175; normal bili. CBC stable. We are referring him for stat abdominal US to evaluate biliary stent. If negative, the transaminitis is likely medication related, more likely chemo vs tylenol rather than amoxicillin. He has been advised to stop tylenol and hepatotoxic agents.  We will hold chemo today due to transaminitis. He will receive 1 L NS over 2 hours. He will return for [place] on 5/21 at 7  am. We will repeat labs and postpone chemo to next week if labs improve. F/u next week. I reviewed the plan with Dr. Burr Medico and pharmacy.     Orders Placed This Encounter  Procedures  . US Abdomen Limited    Standing Status:   Future    Standing Expiration Date:   02/16/2021  Order Specific Question:   Reason for Exam (SYMPTOM  OR DIAGNOSIS REQUIRED)    Answer:   transaminitis, cholangiocarcinoma, on chemo. evaluate CBD stent    Order Specific Question:   Preferred imaging location?    Answer:   [facility]    Order Specific Question:   Call Results- Best Contact Number?    Answer:   [phone]....Marland Kitchenhold patient   All questions were answered. The patient knows to call the clinic with any problems, questions or concerns. No barriers to learning was detected. Total encounter time was 30 minutes.      Alla Feeling, NP 02/17/20

## 2020-02-17 ENCOUNTER — Inpatient Hospital Stay (HOSPITAL_BASED_OUTPATIENT_CLINIC_OR_DEPARTMENT_OTHER): Payer: Medicare Other | Admitting: Nurse Practitioner

## 2020-02-17 ENCOUNTER — Telehealth: Payer: Self-pay

## 2020-02-17 ENCOUNTER — Inpatient Hospital Stay: Payer: Medicare Other

## 2020-02-17 ENCOUNTER — Other Ambulatory Visit: Payer: Self-pay

## 2020-02-17 ENCOUNTER — Encounter: Payer: Self-pay | Admitting: Nurse Practitioner

## 2020-02-17 VITALS — BP 137/67 | HR 89 | Temp 97.5°F | Resp 16 | Ht 67.0 in | Wt 148.8 lb

## 2020-02-17 DIAGNOSIS — Z95828 Presence of other vascular implants and grafts: Secondary | ICD-10-CM

## 2020-02-17 DIAGNOSIS — Z5111 Encounter for antineoplastic chemotherapy: Secondary | ICD-10-CM | POA: Diagnosis not present

## 2020-02-17 DIAGNOSIS — C221 Intrahepatic bile duct carcinoma: Secondary | ICD-10-CM | POA: Diagnosis not present

## 2020-02-17 DIAGNOSIS — R7401 Elevation of levels of liver transaminase levels: Secondary | ICD-10-CM

## 2020-02-17 LAB — CBC WITH DIFFERENTIAL (CANCER CENTER ONLY)
Abs Immature Granulocytes: 0.03 10*3/uL (ref 0.00–0.07)
Basophils Absolute: 0 10*3/uL (ref 0.0–0.1)
Basophils Relative: 1 %
Eosinophils Absolute: 0 10*3/uL (ref 0.0–0.5)
Eosinophils Relative: 1 %
HCT: 25.7 % — ABNORMAL LOW (ref 39.0–52.0)
Hemoglobin: 8.3 g/dL — ABNORMAL LOW (ref 13.0–17.0)
Immature Granulocytes: 1 %
Lymphocytes Relative: 31 %
Lymphs Abs: 1.6 10*3/uL (ref 0.7–4.0)
MCH: 30 pg (ref 26.0–34.0)
MCHC: 32.3 g/dL (ref 30.0–36.0)
MCV: 92.8 fL (ref 80.0–100.0)
Monocytes Absolute: 0.8 10*3/uL (ref 0.1–1.0)
Monocytes Relative: 16 %
Neutro Abs: 2.6 10*3/uL (ref 1.7–7.7)
Neutrophils Relative %: 50 %
Platelet Count: 286 10*3/uL (ref 150–400)
RBC: 2.77 MIL/uL — ABNORMAL LOW (ref 4.22–5.81)
RDW: 17.5 % — ABNORMAL HIGH (ref 11.5–15.5)
WBC Count: 5.2 10*3/uL (ref 4.0–10.5)
nRBC: 0 % (ref 0.0–0.2)

## 2020-02-17 LAB — CMP (CANCER CENTER ONLY)
ALT: 91 U/L — ABNORMAL HIGH (ref 0–44)
AST: 195 U/L (ref 15–41)
Albumin: 3.1 g/dL — ABNORMAL LOW (ref 3.5–5.0)
Alkaline Phosphatase: 175 U/L — ABNORMAL HIGH (ref 38–126)
Anion gap: 10 (ref 5–15)
BUN: 29 mg/dL — ABNORMAL HIGH (ref 8–23)
CO2: 26 mmol/L (ref 22–32)
Calcium: 9.8 mg/dL (ref 8.9–10.3)
Chloride: 98 mmol/L (ref 98–111)
Creatinine: 1.64 mg/dL — ABNORMAL HIGH (ref 0.61–1.24)
GFR, Est AFR Am: 48 mL/min — ABNORMAL LOW (ref >60)
GFR, Estimated: 41 mL/min — ABNORMAL LOW (ref >60)
Glucose, Bld: 282 mg/dL — ABNORMAL HIGH (ref 70–99)
Potassium: 4.8 mmol/L (ref 3.5–5.1)
Sodium: 134 mmol/L — ABNORMAL LOW (ref 135–145)
Total Bilirubin: 0.6 mg/dL (ref 0.3–1.2)
Total Protein: 7.3 g/dL (ref 6.5–8.1)

## 2020-02-17 MED ORDER — SODIUM CHLORIDE 0.9% FLUSH
10.0000 mL | INTRAVENOUS | Status: DC | PRN
  Administered 2020-02-17: 10 mL
  Filled 2020-02-17: qty 10

## 2020-02-17 MED ORDER — HEPARIN SOD (PORK) LOCK FLUSH 100 UNIT/ML IV SOLN
500.0000 [IU] | Freq: Once | INTRAVENOUS | Status: AC | PRN
  Administered 2020-02-17: 500 [IU]
  Filled 2020-02-17: qty 5

## 2020-02-17 MED ORDER — FAMOTIDINE 20 MG PO TABS
20.0000 mg | ORAL_TABLET | Freq: Once | ORAL | Status: AC
  Administered 2020-02-17: 20 mg via ORAL

## 2020-02-17 MED ORDER — TRAMADOL HCL 50 MG PO TABS
ORAL_TABLET | ORAL | Status: AC
  Filled 2020-02-17: qty 1

## 2020-02-17 MED ORDER — TRAMADOL HCL 50 MG PO TABS
50.0000 mg | ORAL_TABLET | Freq: Once | ORAL | Status: AC
  Administered 2020-02-17: 50 mg via ORAL

## 2020-02-17 MED ORDER — SODIUM CHLORIDE 0.9 % IV SOLN
Freq: Once | INTRAVENOUS | Status: AC
  Filled 2020-02-17: qty 250

## 2020-02-17 MED ORDER — FAMOTIDINE 20 MG PO TABS
ORAL_TABLET | ORAL | Status: AC
  Filled 2020-02-17: qty 1

## 2020-02-17 NOTE — Patient Instructions (Signed)

## 2020-02-17 NOTE — Progress Notes (Signed)
Critical Value:  AST: [place], NP notified

## 2020-02-17 NOTE — Telephone Encounter (Signed)
Per Cira Rue NP called and scheduled patient for STAT ultrasound [phone]. They were unable to fit him in today but was able to schedule him for tomorrow 02/18/20 @10 :30am. He is to arrive by 10:15am and to have nothing to eat or drink after midnight. Wrote down appointment date, time, location and instructions for patient and gave it to him in infusion. Patient is aware of appointment date, time, and instructions.

## 2020-02-18 ENCOUNTER — Telehealth: Payer: Self-pay | Admitting: Hematology

## 2020-02-18 ENCOUNTER — Ambulatory Visit (HOSPITAL_COMMUNITY)
Admission: RE | Admit: 2020-02-18 | Discharge: 2020-02-18 | Disposition: A | Discharge status: Home / Self Care | Payer: Medicare Other | Source: Ambulatory Visit | Attending: Nurse Practitioner | Admitting: Nurse Practitioner

## 2020-02-18 DIAGNOSIS — R7401 Elevation of levels of liver transaminase levels: Secondary | ICD-10-CM

## 2020-02-18 NOTE — Telephone Encounter (Signed)
Scheduled appt per 5/20 los.  Left a vm of the appt date and time.

## 2020-02-21 ENCOUNTER — Ambulatory Visit (HOSPITAL_COMMUNITY)
Admission: RE | Admit: 2020-02-21 | Discharge: 2020-02-21 | Disposition: A | Discharge status: Home / Self Care | Payer: Medicare Other | Source: Ambulatory Visit | Attending: Nurse Practitioner | Admitting: Nurse Practitioner

## 2020-02-21 ENCOUNTER — Other Ambulatory Visit: Payer: Self-pay

## 2020-02-21 DIAGNOSIS — R7401 Elevation of levels of liver transaminase levels: Secondary | ICD-10-CM | POA: Insufficient documentation

## 2020-02-21 DIAGNOSIS — K7689 Other specified diseases of liver: Secondary | ICD-10-CM | POA: Diagnosis not present

## 2020-02-24 ENCOUNTER — Encounter: Payer: Self-pay | Admitting: Nurse Practitioner

## 2020-02-24 ENCOUNTER — Inpatient Hospital Stay: Payer: Medicare Other

## 2020-02-24 ENCOUNTER — Other Ambulatory Visit: Payer: Self-pay

## 2020-02-24 ENCOUNTER — Inpatient Hospital Stay (HOSPITAL_BASED_OUTPATIENT_CLINIC_OR_DEPARTMENT_OTHER): Payer: Medicare Other | Admitting: Nurse Practitioner

## 2020-02-24 VITALS — BP 110/64 | HR 88 | Temp 97.8°F | Resp 20 | Ht 67.0 in | Wt 156.9 lb

## 2020-02-24 DIAGNOSIS — C221 Intrahepatic bile duct carcinoma: Secondary | ICD-10-CM | POA: Diagnosis not present

## 2020-02-24 DIAGNOSIS — Z5111 Encounter for antineoplastic chemotherapy: Secondary | ICD-10-CM | POA: Diagnosis not present

## 2020-02-24 DIAGNOSIS — Z95828 Presence of other vascular implants and grafts: Secondary | ICD-10-CM

## 2020-02-24 LAB — CBC WITH DIFFERENTIAL (CANCER CENTER ONLY)
Abs Immature Granulocytes: 0.04 10*3/uL (ref 0.00–0.07)
Basophils Absolute: 0 10*3/uL (ref 0.0–0.1)
Basophils Relative: 1 %
Eosinophils Absolute: 0.1 10*3/uL (ref 0.0–0.5)
Eosinophils Relative: 1 %
HCT: 27.9 % — ABNORMAL LOW (ref 39.0–52.0)
Hemoglobin: 9.1 g/dL — ABNORMAL LOW (ref 13.0–17.0)
Immature Granulocytes: 1 %
Lymphocytes Relative: 29 %
Lymphs Abs: 2.3 10*3/uL (ref 0.7–4.0)
MCH: 30.5 pg (ref 26.0–34.0)
MCHC: 32.6 g/dL (ref 30.0–36.0)
MCV: 93.6 fL (ref 80.0–100.0)
Monocytes Absolute: 1.3 10*3/uL — ABNORMAL HIGH (ref 0.1–1.0)
Monocytes Relative: 16 %
Neutro Abs: 4.3 10*3/uL (ref 1.7–7.7)
Neutrophils Relative %: 52 %
Platelet Count: 311 10*3/uL (ref 150–400)
RBC: 2.98 MIL/uL — ABNORMAL LOW (ref 4.22–5.81)
RDW: 17.5 % — ABNORMAL HIGH (ref 11.5–15.5)
WBC Count: 8.1 10*3/uL (ref 4.0–10.5)
nRBC: 0 % (ref 0.0–0.2)

## 2020-02-24 LAB — CMP (CANCER CENTER ONLY)
ALT: 29 U/L (ref 0–44)
AST: 18 U/L (ref 15–41)
Albumin: 3.4 g/dL — ABNORMAL LOW (ref 3.5–5.0)
Alkaline Phosphatase: 94 U/L (ref 38–126)
Anion gap: 11 (ref 5–15)
BUN: 34 mg/dL — ABNORMAL HIGH (ref 8–23)
CO2: 24 mmol/L (ref 22–32)
Calcium: 9.6 mg/dL (ref 8.9–10.3)
Chloride: 99 mmol/L (ref 98–111)
Creatinine: 1.87 mg/dL — ABNORMAL HIGH (ref 0.61–1.24)
GFR, Est AFR Am: 41 mL/min — ABNORMAL LOW (ref >60)
GFR, Estimated: 35 mL/min — ABNORMAL LOW (ref >60)
Glucose, Bld: 270 mg/dL — ABNORMAL HIGH (ref 70–99)
Potassium: 4.4 mmol/L (ref 3.5–5.1)
Sodium: 134 mmol/L — ABNORMAL LOW (ref 135–145)
Total Bilirubin: 0.4 mg/dL (ref 0.3–1.2)
Total Protein: 7.4 g/dL (ref 6.5–8.1)

## 2020-02-24 LAB — MAGNESIUM: Magnesium: 2 mg/dL (ref 1.7–2.4)

## 2020-02-24 MED ORDER — SODIUM CHLORIDE 0.9 % IV SOLN
Freq: Once | INTRAVENOUS | Status: AC
  Filled 2020-02-24: qty 250

## 2020-02-24 MED ORDER — SODIUM CHLORIDE 0.9 % IV SOLN
800.0000 mg/m2 | Freq: Once | INTRAVENOUS | Status: AC
  Administered 2020-02-24: 1444 mg via INTRAVENOUS
  Filled 2020-02-24: qty 37.98

## 2020-02-24 MED ORDER — PROCHLORPERAZINE MALEATE 10 MG PO TABS
ORAL_TABLET | ORAL | Status: AC
  Filled 2020-02-24: qty 1

## 2020-02-24 MED ORDER — SODIUM CHLORIDE 0.9% FLUSH
10.0000 mL | INTRAVENOUS | Status: DC | PRN
  Administered 2020-02-24: 10 mL
  Filled 2020-02-24: qty 10

## 2020-02-24 MED ORDER — HEPARIN SOD (PORK) LOCK FLUSH 100 UNIT/ML IV SOLN
500.0000 [IU] | Freq: Once | INTRAVENOUS | Status: AC | PRN
  Administered 2020-02-24: 500 [IU]
  Filled 2020-02-24: qty 5

## 2020-02-24 MED ORDER — PROCHLORPERAZINE MALEATE 10 MG PO TABS
10.0000 mg | ORAL_TABLET | Freq: Once | ORAL | Status: AC
  Administered 2020-02-24: 10 mg via ORAL

## 2020-02-24 MED ORDER — SODIUM CHLORIDE 0.9 % IV SOLN
Freq: Once | INTRAVENOUS | Status: DC
  Filled 2020-02-24: qty 250

## 2020-02-24 NOTE — Patient Instructions (Signed)

## 2020-02-24 NOTE — Patient Instructions (Signed)
Cancer Center Discharge Instructions for Patients Receiving Chemotherapy  Today you received the following chemotherapy agents: gemcitabine.  To help prevent nausea and vomiting after your treatment, we encourage you to take your nausea medication as directed.   If you develop nausea and vomiting that is not controlled by your nausea medication, call the clinic.   BELOW ARE SYMPTOMS THAT SHOULD BE REPORTED IMMEDIATELY:  *FEVER GREATER THAN 100.5 F  *CHILLS WITH OR WITHOUT FEVER  NAUSEA AND VOMITING THAT IS NOT CONTROLLED WITH YOUR NAUSEA MEDICATION  *UNUSUAL SHORTNESS OF BREATH  *UNUSUAL BRUISING OR BLEEDING  TENDERNESS IN MOUTH AND THROAT WITH OR WITHOUT PRESENCE OF ULCERS  *URINARY PROBLEMS  *BOWEL PROBLEMS  UNUSUAL RASH Items with * indicate a potential emergency and should be followed up as soon as possible.  Feel free to call the clinic should you have any questions or concerns. The clinic phone number is [phone].  Please show the CHEMO ALERT CARD at check-in to the Emergency Department and triage nurse.   

## 2020-02-24 NOTE — Progress Notes (Signed)
[place]   [phone] [phone]   Clinic Follow up Note   Patient Care Team: [name], DO as PCP - General (Family Medicine) 02/24/2020  CHIEF COMPLAINT: F/u extrahepatic cholangiocarcinoma   SUMMARY OF ONCOLOGIC HISTORY: Oncology History  Cholangiocarcinoma ([place])  01/14/2019 Imaging   US Abdomen 01/14/19  IMPRESSION: There is significant intrahepatic and extrahepatic biliary dilatation present, concerning for distal common bile duct obstruction. Further evaluation with CT scan or MRCP is recommended. Correlation with liver function tests is recommended as well. These results will be called to the ordering clinician or representative by the Radiologist Assistant, and communication documented in the PACS or zVision Dashboard.   Probable large amount of sludge seen within gallbladder lumen with mild gallbladder wall thickening. However, no cholelithiasis, pericholecystic fluid or sonographic Murphy's sign is noted.   4.2 cm septated cyst seen in upper pole of right kidney consistent with Bosniak type 2 lesion. Follow-up ultrasound in 1 year is recommended to ensure stability.   01/20/2019 Imaging   MRI abdomen 01/20/19  IMPRESSION: 1. Findings are highly concerning for central tumor in the biliary tract at the confluence of the common hepatic duct, cystic duct and common bile ducts. Further clinical evaluation for potential cholangiocarcinoma is strongly recommended. 2. Mild ductal dilatation of the main pancreatic duct throughout the distal body and tail of the pancreas where there is also some associated side branch ectasia. This may suggest a pancreatic ductal stricture. No obstructing pancreatic neoplasm identified. 3. Aortic atherosclerosis.   01/21/2019 Initial Biopsy   Diagnosis 01/21/19  BILE DUCT BRUSHING (SPECIMEN 1 OF 1 COLLECTED 01/21/2019) ADENOCARCINOMA.   01/21/2019 Procedure   ERCP By Dr hung 01/21/19 IMRPESSION -  The major papilla appeared normal. - A single localized biliary stricture was found in the middle third of the main bile duct. - The entire main bile duct and upper third of the main bile duct were dilated, secondary to a stricture. - A biliary sphincterotomy was performed. - Cells for cytology obtained in the middle third of the main bile duct. - One plastic stent was placed into the common bile duct.  EUS by Dr hung 01/21/19  IMPRESSION - There was dilation in the middle third of the main bile duct and in the upper third of the main bile duct which measured up to 15 mm. - There was a suggestion of a stricture in the middle third of the main bile duct. - No specimens collected.   02/03/2019 Imaging   CT CAP at [facility] 02/03/19  Impression:  1. There is mild intrahepatic and extrahepatic biliary ductal dilation and diffuse common bile duct wall thickening with stent in place. There is abnormal soft tissue measuring approximately 1.6 cm between the common hepatic artery, portal vein and common bile duct, worrisome for the known cholangiocarcinoma.  2. Periportal lymphadenopathy which may represent nodal metastasis.  4. Marked pancreatic atrophy and duct dilation involving the distal body and tail the pancreas where there is a coarse calcification. These findings are favored to represent stenosis from intraductal stones though an underlying stricture cannot be completely excluded.  5. Atypical symmetric prominent fat stranding of the bilateral lower abdominal wall. Correlate with surgical history or history of history of trauma.   03/08/2019 Initial Diagnosis   Cholangiocarcinoma ([place])   03/12/2019 -  Chemotherapy   Gemcitabine and cisplatin on days 1 and 8 every 21 days starting 03/12/19. Abraxane added with cycle 2. Added GCSF ([name]) to day 9 starting cycle  2. Chemo held since 06/03/19 due to poor toleration. Chemo restarted with Gem/Cis with C6 on 07/02/19.    03/19/2019 Cancer Staging     Staging form: Perihilar Bile Ducts, AJCC 8th Edition - Clinical: Stage Unknown (cTX, cN0, cM0) - Signed by [name], MD on 03/19/2019   05/24/2019 Imaging   CT CAP [facility] at [facility]  1.  Interval placement of covered metallic biliary stent. No progressive dilation of the biliary tree and resulting resolution of the prior main pancreatic duct dilation. 2.  Evidence of some subtle vascular contour deformity involving the hepatic artery, suspected to be from tumoral contact and/or posttreatment changes. 3.  New small right pleural effusion with overlying mild airspace disease. Secondary findings which may indicate a component of aspiration. 4.  No convincing evidence of new disseminated metastatic disease. 5.  Additional findings as discussed above.   06/18/2019 Imaging   CT AP IMPRESSION: 1.  No acute findings in the abdomen/pelvis. 2. Biliary stent in adequate position. No evidence of focal mass or adenopathy in the region of the pancreatic head or porta hepatis. 3. Moderate size right pleural effusion with associated right basilar atelectasis. 4. Possible single gallstone. Stable subcentimeter liver cyst. Stable right renal cysts. Small left inguinal hernia containing only peritoneal fat. 5. Aortic Atherosclerosis (ICD10-I70.0).   07/28/2019 Imaging   CT CAP IMPRESSION: 1. The patient is status post common bile duct stent placement, with unchanged stent position and patent appearance with pneumobilia. Primary cholangiocarcinoma is not discretely appreciated by CT. 2. No direct evidence of metastatic disease in the chest, abdomen, or pelvis. 3. Moderate right pleural effusion with associated atelectasis or consolidation, slightly improved compared to prior examination. 4. The pancreatic parenchyma is atrophic and calcified, particularly in the pancreatic tail. 5.  Coronary artery disease.  Aortic atherosclerosis   10/20/2019 PET scan   IMPRESSION: 1. Redemonstrated post  treatment and post stenting appearance of a central cholangiocarcinoma, which is not directly appreciated by CT. Common bile duct stent remains in position with post stenting pneumobilia. 2. Slight interval increase in a moderate to large right pleural effusion with associated atelectasis or consolidation. There may be some pleural nodularity about the right hemidiaphragm which appears new compared to prior examination (series 2, image 40), highly suspicious for pleural metastatic disease. 3. No direct evidence of metastatic disease in the chest, abdomen, or pelvis. 4. Coronary artery disease.  Aortic Atherosclerosis (ICD10-I70.0).     10/29/2019 Pathology Results   Thoracentesis FINAL MICROSCOPIC DIAGNOSIS:  - Reactive mesothelial cells present    11/17/2019 PET scan   IMPRESSION: 1. Low-level hypermetabolism within the right pleural space, without focal abnormality to strongly suggest pleural metastasis. 2. Hypermetabolism along the course of the common duct stent, nonspecific and most likely reactive. 3. Otherwise, no evidence of hypermetabolic metastasis. 4. Small to moderate right pleural effusion with developing loculation superiorly. 5. Coronary artery atherosclerosis. Aortic Atherosclerosis (ICD10-I70.0). 6.  Possible constipation.     02/03/2020 Imaging   CT CAP IMPRESSION: 1. Stable indistinct circumferential biliary wall thickening in the proximal CBD, without discrete biliary mass. Stable well-positioned CBD stent with intrahepatic pneumobilia indicating patency. 2. No findings of metastatic disease in the abdomen or pelvis. 3. Small dependent right pleural effusion is decreased. Mild diffuse smooth right pleural thickening is similar to slightly increased, nonspecific, cannot exclude malignant pleural effusion. No discrete pleural nodularity. 4. Tiny clustered tree-in-bud type nodules in the anterior left upper lobe, slightly increased, nonspecific, more  likely inflammatory. Recommend attention on  follow-up chest CT in 3 months. 5. Chronic findings include: Chronic pancreatitis. Coronary atherosclerosis. Aortic Atherosclerosis (ICD10-I70.0).       CURRENT THERAPY:  Gemcitabine and cisplatin on days 1 and 8 every 21 daysstarting 03/12/19. Abraxane added with cycle 2.AddedGCSF ([name])to day 9 startingcycle 2.Cisplatin removed starting from cycle13due to renal insufficiency.  INTERVAL HISTORY: Christopher Welch returns for f/u and treatment as scheduled. He was seen 02/17/20 for C16D8 chemo which was held due to transaminitis. He underwent abd [place] which showed a distended gallbladder, no stones, negative sonographic murphy's sign. The CBD stent was in place. Today he feels well. He had 2 days of mid abdominal and RUQ pain this week and increased oxycodone to 2 per day. Not taking tylenol or NSAIDs. Pain resolved an hour after pain meds. Denies pain today. Bowels normal with stool softener and miralax, no n/v. Eating and drinking well. Teeth still little sore but getting better. He completed amoxicilin 5/26. He thought some abd pain may be related to antibiotics. Denies signs of juandice or dark urine. Denies fever, chills, cough, chest pain, dyspnea, leg edema, or new concerns.    MEDICAL HISTORY:  Past Medical History:  Diagnosis Date  . Diabetes mellitus type I, controlled ([place])   . Dyslipidemia   . ED (erectile dysfunction)   . Essential hypertension   . Gilbert's disease   . Hepatitis B carrier [facility])     SURGICAL HISTORY: Past Surgical History:  Procedure Laterality Date  . APPENDECTOMY  1964  . BILIARY BRUSHING  01/21/2019   Procedure: BILIARY BRUSHING;  Surgeon: Carol Ada, MD;  Location: [facility];  Service: Endoscopy;;  . BILIARY STENT PLACEMENT N/A 01/21/2019   Procedure: BILIARY STENT PLACEMENT;  Surgeon: Carol Ada, MD;  Location: [facility];  Service: Endoscopy;  Laterality: N/A;  . ENDOSCOPIC RETROGRADE  CHOLANGIOPANCREATOGRAPHY (ERCP) WITH PROPOFOL N/A 01/21/2019   Procedure: ENDOSCOPIC RETROGRADE CHOLANGIOPANCREATOGRAPHY (ERCP) WITH PROPOFOL;  Surgeon: Carol Ada, MD;  Location: [facility];  Service: Endoscopy;  Laterality: N/A;  . ESOPHAGOGASTRODUODENOSCOPY (EGD) WITH PROPOFOL N/A 01/21/2019   Procedure: ESOPHAGOGASTRODUODENOSCOPY (EGD) WITH PROPOFOL;  Surgeon: Carol Ada, MD;  Location: [facility];  Service: Endoscopy;  Laterality: N/A;  . FEMORAL HERNIA REPAIR    . IR CHOLANGIOGRAM EXISTING TUBE  05/13/2019  . IR CHOLANGIOGRAM EXISTING TUBE  06/08/2019  . IR EXCHANGE BILIARY DRAIN  05/18/2019  . IR EXCHANGE BILIARY DRAIN  06/08/2019  . IR IMAGING GUIDED PORT INSERTION  03/08/2019  . IR PERC CHOLECYSTOSTOMY  05/04/2019  . IR THORACENTESIS ASP PLEURAL SPACE W/IMG GUIDE  10/29/2019  . LEFT HEART CATH AND CORONARY ANGIOGRAPHY N/A 05/19/2017   Procedure: LEFT HEART CATH AND CORONARY ANGIOGRAPHY;  Surgeon: Leonie Man, MD;  Location: [facility];  Service: Cardiovascular;  Laterality: N/A;  . SPHINCTEROTOMY  01/21/2019   Procedure: SPHINCTEROTOMY;  Surgeon: Carol Ada, MD;  Location: [facility];  Service: Endoscopy;;  . UPPER ESOPHAGEAL ENDOSCOPIC ULTRASOUND (EUS) N/A 01/21/2019   Procedure: UPPER ESOPHAGEAL ENDOSCOPIC ULTRASOUND (EUS);  Surgeon: Carol Ada, MD;  Location: [facility];  Service: Endoscopy;  Laterality: N/A;    I have reviewed the social history and family history with the patient and they are unchanged from previous note.  ALLERGIES:  is allergic to erythromycin.  MEDICATIONS:  Current Outpatient Medications  Medication Sig Dispense Refill  . acetaminophen (TYLENOL) 500 MG tablet Take 1,000 mg by mouth 2 (two) times daily as needed for moderate pain or headache.    Marland Kitchen amLODipine (NORVASC) 5 MG  tablet Take 5 mg by mouth daily.     Marland Kitchen atenolol (TENORMIN) 25 MG tablet Take 25 mg by mouth daily. Once a day     . atorvastatin (LIPITOR) 10 MG tablet Take 10 mg by  mouth daily.    . Blood Glucose Monitoring Suppl (ACCU-CHEK NANO SMARTVIEW) W/DEVICE KIT 1 kit by Does not apply route 2 (two) times daily. (Patient taking differently: 1 kit by Does not apply route See admin instructions. Test blood sugars 12x's daily) 1 kit 0  . glucose blood test strip Test 3 times a day. (Patient taking differently: 1 each by Other route See admin instructions. Test 12 times a day.) 300 each Prn  . insulin NPH Human (NOVOLIN N) 100 UNIT/ML injection Inject 20 Units into the skin at bedtime.     . insulin regular (NOVOLIN R) 100 units/mL injection Inject 20 Units into the skin 3 (three) times daily before meals.     . lidocaine-prilocaine (EMLA) cream Apply to affected area once 30 g 3  . lisinopril (ZESTRIL) 20 MG tablet Take 20 mg by mouth daily.     . magnesium oxide (MAG-OX) 400 MG tablet TAKE 1 TABLET BY MOUTH EVERY DAY 90 tablet 1  . mirtazapine (REMERON) 7.5 MG tablet TAKE 1 TABLET (7.5 MG TOTAL) BY MOUTH AT BEDTIME. 90 tablet 1  . ondansetron (ZOFRAN) 8 MG tablet TAKE 1 TABLET BY MOUTH 2 TIMES DAY AS NEEDED. START ON 3RD DAY AFTER CHEMOTHERAPY 30 tablet 1  . oxycodone (OXY-IR) 5 MG capsule Take 1 capsule (5 mg total) by mouth every 6 (six) hours as needed for pain (severe pain). 20 capsule 0  . prochlorperazine (COMPAZINE) 10 MG tablet TAKE 1 TABLET (10 MG TOTAL) BY MOUTH EVERY 6 (SIX) HOURS AS NEEDED (NAUSEA OR VOMITING). 30 tablet 1  . zolpidem (AMBIEN) 5 MG tablet Take 1-2 tablets (5-10 mg total) by mouth at bedtime as needed for sleep. 60 tablet 0  . clonazePAM (KLONOPIN) 0.5 MG tablet     . LORazepam (ATIVAN) 0.5 MG tablet Take 0.5-1 tablets (0.25-0.5 mg total) by mouth every 8 (eight) hours as needed (nausea and vomiting). 30 tablet 0  . metFORMIN (GLUCOPHAGE) 1000 MG tablet Take 2,000 mg by mouth every evening.      Current Facility-Administered Medications  Medication Dose Route Frequency Provider Last Rate Last Admin  . 0.9 %  sodium chloride infusion    Intravenous Once Alla Feeling, NP       Facility-Administered Medications Ordered in Other Visits  Medication Dose Route Frequency Provider Last Rate Last Admin  . 0.9 %  sodium chloride infusion (Manually program via Guardrails IV Fluids)  250 mL Intravenous Once [name], MD      . gemcitabine (GEMZAR) 1,444 mg in sodium chloride 0.9 % 250 mL chemo infusion  800 mg/m2 (Treatment Plan Recorded) Intravenous Once [name], MD      . heparin lock flush 100 unit/mL  500 Units Intracatheter Once PRN [name], MD      . sodium chloride flush (NS) 0.9 % injection 10 mL  10 mL Intracatheter PRN [name], MD        PHYSICAL EXAMINATION: ECOG PERFORMANCE STATUS: 1 - Symptomatic but completely ambulatory  Vitals:   02/24/20 0820  BP: 110/64  Pulse: 88  Resp: 20  Temp: 97.8 F (36.6 C)  SpO2: 100%   Filed Weights   02/24/20 0820  Weight: 156 lb 14.4 oz (71.2 kg)    GENERAL:alert,  no distress and comfortable SKIN: no rash  EYES:  sclera anicteric  OROPHARYNX: no thrush or ulcers. Granulation tissue noted to right upper extraction site. No surrounding erythema or drainage NECK: without mass LUNGS: clear with normal breathing effort HEART: regular rate & rhythm, no lower extremity edema ABDOMEN: abdomen soft, non-tender and normal bowel sounds NEURO: alert & oriented x 3 with fluent speech, normal gait PAC without erythema   LABORATORY DATA:  I have reviewed the data as listed CBC Latest Ref Rng & Units 02/24/2020 02/17/2020 02/14/2020  WBC 4.0 - 10.5 K/uL 8.1 5.2 4.8  Hemoglobin 13.0 - 17.0 g/dL 9.1(L) 8.3(L) 7.9(L)  Hematocrit 39.0 - 52.0 % 27.9(L) 25.7(L) 24.5(L)  Platelets 150 - 400 K/uL 311 286 322     CMP Latest Ref Rng & Units 02/24/2020 02/17/2020 02/14/2020  Glucose 70 - 99 mg/dL 270(H) 282(H) 169(H)  BUN 8 - 23 mg/dL 34(H) 29(H) 24(H)  Creatinine 0.61 - 1.24 mg/dL 1.87(H) 1.64(H) 1.42(H)  Sodium 135 - 145 mmol/L 134(L) 134(L) 136  Potassium 3.5 - 5.1 mmol/L 4.4 4.8  4.2  Chloride 98 - 111 mmol/L 99 98 103  CO2 22 - 32 mmol/L 24 26 24   Calcium 8.9 - 10.3 mg/dL 9.6 9.8 9.0  Total Protein 6.5 - 8.1 g/dL 7.4 7.3 7.1  Total Bilirubin 0.3 - 1.2 mg/dL 0.4 0.6 0.5  Alkaline Phos 38 - 126 U/L 94 175(H) 52  AST 15 - 41 U/L 18 195(HH) 32  ALT 0 - 44 U/L 29 91(H) 29      RADIOGRAPHIC STUDIES: I have personally reviewed the radiological images as listed and agreed with the findings in the report. No results found.   ASSESSMENT & PLAN: Christopher Welch a 74y.o.malewith   1. Extrahepatic cholangiocarcinoma, cTxN0M0 -He wasdiagnosedin 12/2018; presented withmalignant stricture and CBD obstruction required stenting. Brushing revealed adenocarcinoma. He underwent attempted whipple per Dr. Zenia Resides at [facility] which was aborted due to vascular invasion - [facility]Dr. Carlis [facility] -He was referred to our clinic to initiate chemotherapy in the neoadjuvant setting. -He proceeded with neoadjuvantgemcitabine and cisplatin on days 1 and 8 every 21 daysstarting 03/12/19. Abraxane added with cycle 2.AddedGCSF ([name])to day 9 startingcycle 2. -Hetolerated first few cycles very well without significant side effects;tumor marker CA 19.9dropped significantly as he started chemo, indicating good response to chemo treatment -his performance status decreasedandabraxane was held with cycle 4  -restaging CT CAP on 10/20/19 showed stable disease in the liverbut with increased right pleural effusion and nodularity along the pleura, concerncing for pleural metastasis.  -He underwent diagnostic and therapeutic thoracentesis on 10/29/19, path showed reactive mesothelial cells -PET from 11/17/19 shows right pleural effusion. No evidence of hypermetabolic metastasis, although plural mets not ruled out.  -Per Dr. Carlis Abbott on 2/22 he is not a surgical candidate due to decreased PS.  -He continuedcisplatin and gemcitabine 2 weeks on/1 week off with Udenycawith total 13 cycles,  tolerated mostly well except increased fatigue. He received single agent gemcitabine for cycle 14 and 15 -CT CAP from 02/03/20 shows stable disease, continue single agent gemcitabine. Tolerating well with mild fatigue and low appetite on days 2-3, able to recover well  -C16 day 8 cancelled due to transaminitis. He has recovered well. Labs normalized and CBC/CMP adequate to proceed with treatment C17D1 gemcitabine today.  -Return for day 8 next week   2.CINV, constipation -nausea worsened secondary to chemotherapy and recent constipation episode in 08/2019, now mild -low dose ativan was added for increased n/vifzofran  and compazine are adequate -will try to avoidpodecadrondue to DM -constipation worsened this week, likely due to increased oxycodone use for oral pain from extractions  -He will begin bowel regimen with colace and miralax BID until regular BM, then PRN. -Stable, controlled   3.H/oAcute cholecystitis -Hospitalizedon 05/03/19; s/ppercutaneous gallbladder drainage tube placed by IR8/5/20. -completed antibiotics -tube exchanged 8/18/20due to leakagewhich has resolved, his abdominal pain improved until it began leaking again on 9/8. IR could not exchange the tube as the gallbladder was completely decompressed and it was removed -[place] on 9/18/20showed no evidence of cholecystitis -He had marked transaminitis on 5/20, with normal bili. No clinical signs of juandice or cholangitis. US showed distended gall bladder.  -LFTs normalized today. No concern for cholecystitis    4. Weight lossand low appetite -secondary to malignancyand chemo;he presented with 20 lbs weight loss  -Continue to f/u with dieticianand supplements -His appetitehas dropped significantly when he hadcholecystitis.  -Recently started Mirtazapine which is helping  5. DM -managed by PCP Dr. Theda Sers -he has had DM for 20 years, he is compliant with regimen and knows how to adjust BG -on insulin,  he knows how to adjust dose based on his BG level -uncontrolled lately due to poor diet secondary to oral extractions.  -his worsening renal dysfunction is likely related to uncontrolled DM, poor hydration, and medication. I strongly encouraged him to keep BG <200   6. CAD, HL, HTN -followed by cardiologist Dr. Ellyn Hack -on amlodipine, atenolol, and statin. Lisinopril on hold lately for stable BP and renal dysfunction -may hold statin if LFTs worsen, continue for now -stable  7. Social support -he is single, no children, he lives alone.He has a good friend whochecks on him -followed by SW  8.Insomnia -He is able to fall asleep but has trouble staying asleep -tried Z-quil and ambien but stopped due to lack of efficacy -Mirtazapine is helping   Disposition:  Christopher Welch appears stable. Abd [place] on 5/21 showed distended gallbladder, stent in place. His transaminitis was likely related to medication and has now resolved. However, SCr has increased. Possibly due to amoxicillin which he completed 5/26 and inadequate hydration. His BG has been higher lately but not eating much due to dental extractions. He remains off lisinopril and not taking NSAIDs. His renal dysfunction is likely related to medication, dehydration, and uncontrolled DM. He will receive 1 L NS today with chemo. I encouraged him to increase oral hydration at home. He will continue holding lisinopril and nephrotoxic agents. I strongly recommend to control his DM tighter, to keep BG <200. CBC stable.  Otherwise he is doing well. He will proceed with cycle 17 day 1 gemcitabine today. He will return for C17 day 8 chemo next week, then off a week. F/u in 3 weeks with next cycle. I reviewed the plan with Dr. Burr Medico, pharmacy, and nursing.  All questions were answered. The patient knows to call the clinic with any problems, questions or concerns. No barriers to learning was detected. Total encounter time was 30 minutes.      Alla Feeling, NP 02/24/20

## 2020-02-25 ENCOUNTER — Telehealth: Payer: Self-pay | Admitting: Hematology

## 2020-02-25 ENCOUNTER — Other Ambulatory Visit: Payer: Self-pay | Admitting: Nurse Practitioner

## 2020-02-25 ENCOUNTER — Other Ambulatory Visit: Payer: Self-pay | Admitting: Hematology

## 2020-02-25 MED ORDER — OXYCODONE HCL 5 MG PO CAPS: 5.0000 mg | ORAL_CAPSULE | Freq: Four times a day (QID) | ORAL | 0 refills | Status: DC | PRN

## 2020-02-25 NOTE — Telephone Encounter (Signed)
Scheduled appt per 5/27 los.  Was not able to leave a vm of the patient phone  Left a vm of the appt date and time, on the pt next point of conatct

## 2020-02-25 NOTE — Telephone Encounter (Signed)
Refill request

## 2020-02-25 NOTE — Progress Notes (Signed)
Pharmacist Chemotherapy Monitoring - Follow Up Assessment    I verify that I have reviewed each item in the below checklist:  . Regimen for the patient is scheduled for the appropriate day and plan matches scheduled date. Marland Kitchen Appropriate non-routine labs are ordered dependent on drug ordered. . If applicable, additional medications reviewed and ordered per protocol based on lifetime cumulative doses and/or treatment regimen.   Plan for follow-up and/or issues identified: No . I-vent associated with next due treatment: No . MD and/or nursing notified: No  Bethanie Bloxom D 02/25/2020 4:18 PM

## 2020-03-02 ENCOUNTER — Inpatient Hospital Stay: Payer: Medicare Other

## 2020-03-02 ENCOUNTER — Other Ambulatory Visit: Payer: Medicare Other

## 2020-03-02 ENCOUNTER — Other Ambulatory Visit: Payer: Self-pay

## 2020-03-02 ENCOUNTER — Ambulatory Visit: Payer: Medicare Other | Admitting: Hematology

## 2020-03-02 ENCOUNTER — Ambulatory Visit: Payer: Medicare Other

## 2020-03-02 ENCOUNTER — Inpatient Hospital Stay: Payer: Medicare Other | Attending: Hematology

## 2020-03-02 VITALS — BP 127/67 | HR 70 | Temp 98.7°F | Resp 18 | Ht 67.0 in | Wt 149.2 lb

## 2020-03-02 DIAGNOSIS — Z794 Long term (current) use of insulin: Secondary | ICD-10-CM | POA: Diagnosis not present

## 2020-03-02 DIAGNOSIS — I129 Hypertensive chronic kidney disease with stage 1 through stage 4 chronic kidney disease, or unspecified chronic kidney disease: Secondary | ICD-10-CM | POA: Diagnosis not present

## 2020-03-02 DIAGNOSIS — N183 Chronic kidney disease, stage 3 unspecified: Secondary | ICD-10-CM | POA: Insufficient documentation

## 2020-03-02 DIAGNOSIS — E1022 Type 1 diabetes mellitus with diabetic chronic kidney disease: Secondary | ICD-10-CM | POA: Diagnosis not present

## 2020-03-02 DIAGNOSIS — E1040 Type 1 diabetes mellitus with diabetic neuropathy, unspecified: Secondary | ICD-10-CM | POA: Insufficient documentation

## 2020-03-02 DIAGNOSIS — Z5111 Encounter for antineoplastic chemotherapy: Secondary | ICD-10-CM | POA: Insufficient documentation

## 2020-03-02 DIAGNOSIS — C221 Intrahepatic bile duct carcinoma: Secondary | ICD-10-CM

## 2020-03-02 DIAGNOSIS — R634 Abnormal weight loss: Secondary | ICD-10-CM | POA: Insufficient documentation

## 2020-03-02 DIAGNOSIS — C240 Malignant neoplasm of extrahepatic bile duct: Secondary | ICD-10-CM | POA: Diagnosis present

## 2020-03-02 DIAGNOSIS — J90 Pleural effusion, not elsewhere classified: Secondary | ICD-10-CM | POA: Insufficient documentation

## 2020-03-02 DIAGNOSIS — I2510 Atherosclerotic heart disease of native coronary artery without angina pectoris: Secondary | ICD-10-CM | POA: Insufficient documentation

## 2020-03-02 DIAGNOSIS — Z8719 Personal history of other diseases of the digestive system: Secondary | ICD-10-CM | POA: Diagnosis not present

## 2020-03-02 LAB — CBC WITH DIFFERENTIAL (CANCER CENTER ONLY)
Abs Immature Granulocytes: 0.02 10*3/uL (ref 0.00–0.07)
Basophils Absolute: 0 10*3/uL (ref 0.0–0.1)
Basophils Relative: 1 %
Eosinophils Absolute: 0 10*3/uL (ref 0.0–0.5)
Eosinophils Relative: 1 %
HCT: 24.1 % — ABNORMAL LOW (ref 39.0–52.0)
Hemoglobin: 8 g/dL — ABNORMAL LOW (ref 13.0–17.0)
Immature Granulocytes: 1 %
Lymphocytes Relative: 44 %
Lymphs Abs: 1.7 10*3/uL (ref 0.7–4.0)
MCH: 30.7 pg (ref 26.0–34.0)
MCHC: 33.2 g/dL (ref 30.0–36.0)
MCV: 92.3 fL (ref 80.0–100.0)
Monocytes Absolute: 0.7 10*3/uL (ref 0.1–1.0)
Monocytes Relative: 18 %
Neutro Abs: 1.3 10*3/uL — ABNORMAL LOW (ref 1.7–7.7)
Neutrophils Relative %: 35 %
Platelet Count: 276 10*3/uL (ref 150–400)
RBC: 2.61 MIL/uL — ABNORMAL LOW (ref 4.22–5.81)
RDW: 16.2 % — ABNORMAL HIGH (ref 11.5–15.5)
WBC Count: 3.8 10*3/uL — ABNORMAL LOW (ref 4.0–10.5)
nRBC: 0.5 % — ABNORMAL HIGH (ref 0.0–0.2)

## 2020-03-02 LAB — CMP (CANCER CENTER ONLY)
ALT: 17 U/L (ref 0–44)
AST: 20 U/L (ref 15–41)
Albumin: 3.2 g/dL — ABNORMAL LOW (ref 3.5–5.0)
Alkaline Phosphatase: 72 U/L (ref 38–126)
Anion gap: 9 (ref 5–15)
BUN: 15 mg/dL (ref 8–23)
CO2: 24 mmol/L (ref 22–32)
Calcium: 8.9 mg/dL (ref 8.9–10.3)
Chloride: 102 mmol/L (ref 98–111)
Creatinine: 1.41 mg/dL — ABNORMAL HIGH (ref 0.61–1.24)
GFR, Est AFR Am: 57 mL/min — ABNORMAL LOW (ref >60)
GFR, Estimated: 49 mL/min — ABNORMAL LOW (ref >60)
Glucose, Bld: 98 mg/dL (ref 70–99)
Potassium: 3.9 mmol/L (ref 3.5–5.1)
Sodium: 135 mmol/L (ref 135–145)
Total Bilirubin: 0.4 mg/dL (ref 0.3–1.2)
Total Protein: 7.1 g/dL (ref 6.5–8.1)

## 2020-03-02 LAB — MAGNESIUM: Magnesium: 1.9 mg/dL (ref 1.7–2.4)

## 2020-03-02 MED ORDER — SODIUM CHLORIDE 0.9 % IV SOLN
Freq: Once | INTRAVENOUS | Status: AC
  Filled 2020-03-02: qty 250

## 2020-03-02 MED ORDER — HEPARIN SOD (PORK) LOCK FLUSH 100 UNIT/ML IV SOLN
500.0000 [IU] | Freq: Once | INTRAVENOUS | Status: AC | PRN
  Administered 2020-03-02: 500 [IU]
  Filled 2020-03-02: qty 5

## 2020-03-02 MED ORDER — SODIUM CHLORIDE 0.9% FLUSH
10.0000 mL | INTRAVENOUS | Status: DC | PRN
  Administered 2020-03-02: 10 mL
  Filled 2020-03-02: qty 10

## 2020-03-02 MED ORDER — PROCHLORPERAZINE MALEATE 10 MG PO TABS
ORAL_TABLET | ORAL | Status: AC
  Filled 2020-03-02: qty 1

## 2020-03-02 MED ORDER — PROCHLORPERAZINE MALEATE 10 MG PO TABS
10.0000 mg | ORAL_TABLET | Freq: Once | ORAL | Status: AC
  Administered 2020-03-02: 10 mg via ORAL

## 2020-03-02 MED ORDER — SODIUM CHLORIDE 0.9 % IV SOLN
800.0000 mg/m2 | Freq: Once | INTRAVENOUS | Status: AC
  Administered 2020-03-02: 1444 mg via INTRAVENOUS
  Filled 2020-03-02: qty 37.98

## 2020-03-02 NOTE — Progress Notes (Signed)
Received okay to treat for ANC of 1.3 from Dr. Burr Medico.

## 2020-03-02 NOTE — Patient Instructions (Signed)
[facility] Discharge Instructions for Patients Receiving Chemotherapy  Today you received the following chemotherapy agents Gemzar To help prevent nausea and vomiting after your treatment, we encourage you to take your nausea medication as prescribed.  If you develop nausea and vomiting that is not controlled by your nausea medication, call the clinic.   BELOW ARE SYMPTOMS THAT SHOULD BE REPORTED IMMEDIATELY:  *FEVER GREATER THAN 100.5 F  *CHILLS WITH OR WITHOUT FEVER  NAUSEA AND VOMITING THAT IS NOT CONTROLLED WITH YOUR NAUSEA MEDICATION  *UNUSUAL SHORTNESS OF BREATH  *UNUSUAL BRUISING OR BLEEDING  TENDERNESS IN MOUTH AND THROAT WITH OR WITHOUT PRESENCE OF ULCERS  *URINARY PROBLEMS  *BOWEL PROBLEMS  UNUSUAL RASH Items with * indicate a potential emergency and should be followed up as soon as possible.  Feel free to call the clinic should you have any questions or concerns. The clinic phone number is [phone].  Please show the CHEMO ALERT CARD at check-in to the Emergency Department and triage nurse.   

## 2020-03-09 ENCOUNTER — Other Ambulatory Visit: Payer: Medicare Other

## 2020-03-09 ENCOUNTER — Ambulatory Visit: Payer: Medicare Other

## 2020-03-09 ENCOUNTER — Ambulatory Visit: Payer: Medicare Other | Admitting: Hematology

## 2020-03-14 NOTE — Progress Notes (Signed)
[place]   [phone] [phone]   Clinic Follow up Note   Patient Care Team: [name], DO as PCP - General (Family Medicine)  Date of Service:  03/16/2020  CHIEF COMPLAINT: F/u of ExtrahepaticCholangiocarcinoma  SUMMARY OF ONCOLOGIC HISTORY: Oncology History  Cholangiocarcinoma ([place])  01/14/2019 Imaging   US Abdomen 01/14/19  IMPRESSION: There is significant intrahepatic and extrahepatic biliary dilatation present, concerning for distal common bile duct obstruction. Further evaluation with CT scan or MRCP is recommended. Correlation with liver function tests is recommended as well. These results will be called to the ordering clinician or representative by the Radiologist Assistant, and communication documented in the PACS or zVision Dashboard.   Probable large amount of sludge seen within gallbladder lumen with mild gallbladder wall thickening. However, no cholelithiasis, pericholecystic fluid or sonographic Murphy's sign is noted.   4.2 cm septated cyst seen in upper pole of right kidney consistent with Bosniak type 2 lesion. Follow-up ultrasound in 1 year is recommended to ensure stability.   01/20/2019 Imaging   MRI abdomen 01/20/19  IMPRESSION: 1. Findings are highly concerning for central tumor in the biliary tract at the confluence of the common hepatic duct, cystic duct and common bile ducts. Further clinical evaluation for potential cholangiocarcinoma is strongly recommended. 2. Mild ductal dilatation of the main pancreatic duct throughout the distal body and tail of the pancreas where there is also some associated side branch ectasia. This may suggest a pancreatic ductal stricture. No obstructing pancreatic neoplasm identified. 3. Aortic atherosclerosis.   01/21/2019 Initial Biopsy   Diagnosis 01/21/19  BILE DUCT BRUSHING (SPECIMEN 1 OF 1 COLLECTED 01/21/2019) ADENOCARCINOMA.   01/21/2019 Procedure   ERCP By Dr hung  01/21/19 IMRPESSION - The major papilla appeared normal. - A single localized biliary stricture was found in the middle third of the main bile duct. - The entire main bile duct and upper third of the main bile duct were dilated, secondary to a stricture. - A biliary sphincterotomy was performed. - Cells for cytology obtained in the middle third of the main bile duct. - One plastic stent was placed into the common bile duct.  EUS by Dr hung 01/21/19  IMPRESSION - There was dilation in the middle third of the main bile duct and in the upper third of the main bile duct which measured up to 15 mm. - There was a suggestion of a stricture in the middle third of the main bile duct. - No specimens collected.   02/03/2019 Imaging   CT CAP at [facility] 02/03/19  Impression:  1. There is mild intrahepatic and extrahepatic biliary ductal dilation and diffuse common bile duct wall thickening with stent in place. There is abnormal soft tissue measuring approximately 1.6 cm between the common hepatic artery, portal vein and common bile duct, worrisome for the known cholangiocarcinoma.  2. Periportal lymphadenopathy which may represent nodal metastasis.  4. Marked pancreatic atrophy and duct dilation involving the distal body and tail the pancreas where there is a coarse calcification. These findings are favored to represent stenosis from intraductal stones though an underlying stricture cannot be completely excluded.  5. Atypical symmetric prominent fat stranding of the bilateral lower abdominal wall. Correlate with surgical history or history of history of trauma.   03/08/2019 Initial Diagnosis   Cholangiocarcinoma ([place])   03/12/2019 -  Chemotherapy   Gemcitabine and cisplatin on days 1 and 8 every 21 days starting 03/12/19. Abraxane added with cycle 2. Added GCSF [name]) to  day 9 starting cycle 2.  Abraxane stopped after C4 and chemo held 06/03/19-07/02/19 due to poor toleration/hospitalization.  Cisplatin removed starting from cycle 13 due to renal insufficiency.    03/19/2019 Cancer Staging   Staging form: Perihilar Bile Ducts, AJCC 8th Edition - Clinical: Stage Unknown (cTX, cN0, cM0) - Signed by [name], MD on 03/19/2019   05/24/2019 Imaging   CT CAP W [facility] at [facility]  1.  Interval placement of covered metallic biliary stent. No progressive dilation of the biliary tree and resulting resolution of the prior main pancreatic duct dilation. 2.  Evidence of some subtle vascular contour deformity involving the hepatic artery, suspected to be from tumoral contact and/or posttreatment changes. 3.  New small right pleural effusion with overlying mild airspace disease. Secondary findings which may indicate a component of aspiration. 4.  No convincing evidence of new disseminated metastatic disease. 5.  Additional findings as discussed above.   06/18/2019 Imaging   CT AP IMPRESSION: 1.  No acute findings in the abdomen/pelvis. 2. Biliary stent in adequate position. No evidence of focal mass or adenopathy in the region of the pancreatic head or porta hepatis. 3. Moderate size right pleural effusion with associated right basilar atelectasis. 4. Possible single gallstone. Stable subcentimeter liver cyst. Stable right renal cysts. Small left inguinal hernia containing only peritoneal fat. 5. Aortic Atherosclerosis (ICD10-I70.0).   07/28/2019 Imaging   CT CAP IMPRESSION: 1. The patient is status post common bile duct stent placement, with unchanged stent position and patent appearance with pneumobilia. Primary cholangiocarcinoma is not discretely appreciated by CT. 2. No direct evidence of metastatic disease in the chest, abdomen, or pelvis. 3. Moderate right pleural effusion with associated atelectasis or consolidation, slightly improved compared to prior examination. 4. The pancreatic parenchyma is atrophic and calcified, particularly in the pancreatic tail. 5.  Coronary artery  disease.  Aortic atherosclerosis   10/20/2019 PET scan   IMPRESSION: 1. Redemonstrated post treatment and post stenting appearance of a central cholangiocarcinoma, which is not directly appreciated by CT. Common bile duct stent remains in position with post stenting pneumobilia. 2. Slight interval increase in a moderate to large right pleural effusion with associated atelectasis or consolidation. There may be some pleural nodularity about the right hemidiaphragm which appears new compared to prior examination (series 2, image 40), highly suspicious for pleural metastatic disease. 3. No direct evidence of metastatic disease in the chest, abdomen, or pelvis. 4. Coronary artery disease.  Aortic Atherosclerosis (ICD10-I70.0).     10/29/2019 Pathology Results   Thoracentesis FINAL MICROSCOPIC DIAGNOSIS:  - Reactive mesothelial cells present    11/17/2019 PET scan   IMPRESSION: 1. Low-level hypermetabolism within the right pleural space, without focal abnormality to strongly suggest pleural metastasis. 2. Hypermetabolism along the course of the common duct stent, nonspecific and most likely reactive. 3. Otherwise, no evidence of hypermetabolic metastasis. 4. Small to moderate right pleural effusion with developing loculation superiorly. 5. Coronary artery atherosclerosis. Aortic Atherosclerosis (ICD10-I70.0). 6.  Possible constipation.     02/03/2020 Imaging   CT CAP IMPRESSION: 1. Stable indistinct circumferential biliary wall thickening in the proximal CBD, without discrete biliary mass. Stable well-positioned CBD stent with intrahepatic pneumobilia indicating patency. 2. No findings of metastatic disease in the abdomen or pelvis. 3. Small dependent right pleural effusion is decreased. Mild diffuse smooth right pleural thickening is similar to slightly increased, nonspecific, cannot exclude malignant pleural effusion. No discrete pleural nodularity. 4. Tiny clustered tree-in-bud  type nodules in the anterior left upper  lobe, slightly increased, nonspecific, more likely inflammatory. Recommend attention on follow-up chest CT in 3 months. 5. Chronic findings include: Chronic pancreatitis. Coronary atherosclerosis. Aortic Atherosclerosis (ICD10-I70.0).     02/21/2020 Imaging   US Abdomen  IMPRESSION: Mildly distended gallbladder without visible wall thickening or stones.   Common bile duct stent in place. Slight prominence of intrahepatic biliary ducts in the left hepatic lobe.   Increased echotexture of the liver suggesting fatty infiltration.      CURRENT THERAPY:  Gemcitabine and cisplatin on days 1 and 8 every 21 daysstarting 03/12/19. Abraxane added with cycle 2.AddedGCSF ([name])to day 9 startingcycle 2. Abraxane stopped after C4 and chemo held9/3/20-10/2/20 due to poor toleration/hospitalization. Cisplatin removed starting from cycle13due to renal insufficiency.  INTERVAL HISTORY:  Christopher Welch is here for a follow up and treatment. He presents to the clinic alone. He has been doing well. He notes he still gets tires fast from treatment. He notes he is taking multivitamin. His breathing is stable, he has mild labored breathing after long walks. He notes the last 3 days he had more energy being off treatment 2 weeks. He notes [facility] and [facility] has not noted available clinical trails for him. He notes with teeth extractions he has been on liquid diet and lost some weight.     REVIEW OF SYSTEMS:  Constitutional: Denies fevers, chills or abnormal weight loss (+) Fatigue  Eyes: Denies blurriness of vision Ears, nose, mouth, throat, and face: Denies mucositis or sore throat Respiratory: Denies cough, dyspnea or wheezes Cardiovascular: Denies palpitation, chest discomfort or lower extremity swelling Gastrointestinal:  Denies nausea, heartburn or change in bowel habits Skin: Denies abnormal skin rashes Lymphatics: Denies new lymphadenopathy or easy  bruising Neurological:Denies numbness, tingling or new weaknesses Behavioral/Psych: Mood is stable, no new changes  All other systems were reviewed with the patient and are negative.  MEDICAL HISTORY:  Past Medical History:  Diagnosis Date  . Diabetes mellitus type I, controlled ([place])   . Dyslipidemia   . ED (erectile dysfunction)   . Essential hypertension   . Gilbert's disease   . Hepatitis B carrier [facility])     SURGICAL HISTORY: Past Surgical History:  Procedure Laterality Date  . APPENDECTOMY  1964  . BILIARY BRUSHING  01/21/2019   Procedure: BILIARY BRUSHING;  Surgeon: Carol Ada, MD;  Location: [facility];  Service: Endoscopy;;  . BILIARY STENT PLACEMENT N/A 01/21/2019   Procedure: BILIARY STENT PLACEMENT;  Surgeon: Carol Ada, MD;  Location: [facility];  Service: Endoscopy;  Laterality: N/A;  . ENDOSCOPIC RETROGRADE CHOLANGIOPANCREATOGRAPHY (ERCP) WITH PROPOFOL N/A 01/21/2019   Procedure: ENDOSCOPIC RETROGRADE CHOLANGIOPANCREATOGRAPHY (ERCP) WITH PROPOFOL;  Surgeon: Carol Ada, MD;  Location: [facility];  Service: Endoscopy;  Laterality: N/A;  . ESOPHAGOGASTRODUODENOSCOPY (EGD) WITH PROPOFOL N/A 01/21/2019   Procedure: ESOPHAGOGASTRODUODENOSCOPY (EGD) WITH PROPOFOL;  Surgeon: Carol Ada, MD;  Location: [facility];  Service: Endoscopy;  Laterality: N/A;  . FEMORAL HERNIA REPAIR    . IR CHOLANGIOGRAM EXISTING TUBE  05/13/2019  . IR CHOLANGIOGRAM EXISTING TUBE  06/08/2019  . IR EXCHANGE BILIARY DRAIN  05/18/2019  . IR EXCHANGE BILIARY DRAIN  06/08/2019  . IR IMAGING GUIDED PORT INSERTION  03/08/2019  . IR PERC CHOLECYSTOSTOMY  05/04/2019  . IR THORACENTESIS ASP PLEURAL SPACE W/IMG GUIDE  10/29/2019  . LEFT HEART CATH AND CORONARY ANGIOGRAPHY N/A 05/19/2017   Procedure: LEFT HEART CATH AND CORONARY ANGIOGRAPHY;  Surgeon: Leonie Man, MD;  Location: [place] [facility];  Service: Cardiovascular;  Laterality:  N/A;  . SPHINCTEROTOMY  01/21/2019   Procedure: SPHINCTEROTOMY;   Surgeon: Carol Ada, MD;  Location: [facility];  Service: Endoscopy;;  . UPPER ESOPHAGEAL ENDOSCOPIC ULTRASOUND (EUS) N/A 01/21/2019   Procedure: UPPER ESOPHAGEAL ENDOSCOPIC ULTRASOUND (EUS);  Surgeon: Carol Ada, MD;  Location: [facility];  Service: Endoscopy;  Laterality: N/A;    I have reviewed the social history and family history with the patient and they are unchanged from previous note.  ALLERGIES:  is allergic to erythromycin.  MEDICATIONS:  Current Outpatient Medications  Medication Sig Dispense Refill  . acetaminophen (TYLENOL) 500 MG tablet Take 1,000 mg by mouth 2 (two) times daily as needed for moderate pain or headache.    Marland Kitchen amLODipine (NORVASC) 5 MG tablet Take 5 mg by mouth daily.     Marland Kitchen atenolol (TENORMIN) 25 MG tablet Take 25 mg by mouth daily. Once a day     . atorvastatin (LIPITOR) 10 MG tablet Take 10 mg by mouth daily.    . Blood Glucose Monitoring Suppl (ACCU-CHEK NANO SMARTVIEW) W/DEVICE KIT 1 kit by Does not apply route 2 (two) times daily. (Patient taking differently: 1 kit by Does not apply route See admin instructions. Test blood sugars 12x's daily) 1 kit 0  . clonazePAM (KLONOPIN) 0.5 MG tablet     . glucose blood test strip Test 3 times a day. (Patient taking differently: 1 each by Other route See admin instructions. Test 12 times a day.) 300 each Prn  . insulin NPH Human (NOVOLIN N) 100 UNIT/ML injection Inject 20 Units into the skin at bedtime.     . insulin regular (NOVOLIN R) 100 units/mL injection Inject 20 Units into the skin 3 (three) times daily before meals.     . lidocaine-prilocaine (EMLA) cream Apply to affected area once 30 g 3  . lisinopril (ZESTRIL) 20 MG tablet Take 20 mg by mouth daily.     Marland Kitchen LORazepam (ATIVAN) 0.5 MG tablet Take 0.5-1 tablets (0.25-0.5 mg total) by mouth every 8 (eight) hours as needed (nausea and vomiting). 30 tablet 0  . magnesium oxide (MAG-OX) 400 MG tablet TAKE 1 TABLET BY MOUTH EVERY DAY 90 tablet 1  . metFORMIN  (GLUCOPHAGE) 1000 MG tablet Take 2,000 mg by mouth every evening.     . mirtazapine (REMERON) 7.5 MG tablet TAKE 1 TABLET (7.5 MG TOTAL) BY MOUTH AT BEDTIME. 90 tablet 1  . ondansetron (ZOFRAN) 8 MG tablet TAKE 1 TABLET BY MOUTH 2 TIMES DAY AS NEEDED. START ON 3RD DAY AFTER CHEMOTHERAPY 30 tablet 1  . oxycodone (OXY-IR) 5 MG capsule Take 1 capsule (5 mg total) by mouth every 6 (six) hours as needed for pain (severe pain). 40 capsule 0  . prochlorperazine (COMPAZINE) 10 MG tablet TAKE 1 TABLET (10 MG TOTAL) BY MOUTH EVERY 6 (SIX) HOURS AS NEEDED (NAUSEA OR VOMITING). 30 tablet 1  . zolpidem (AMBIEN) 5 MG tablet Take 1-2 tablets (5-10 mg total) by mouth at bedtime as needed for sleep. 60 tablet 0   No current facility-administered medications for this visit.   Facility-Administered Medications Ordered in Other Visits  Medication Dose Route Frequency Provider Last Rate Last Admin  . 0.9 %  sodium chloride infusion (Manually program via Guardrails IV Fluids)  250 mL Intravenous Once [name], MD        PHYSICAL EXAMINATION: ECOG PERFORMANCE STATUS: 2 - Symptomatic, <50% confined to bed  Vitals:   03/16/20 0829  BP: 122/68  Pulse: 78  Resp: 17  Temp:  97.7 F (36.5 C)  SpO2: 99%   Filed Weights   03/16/20 0829  Weight: 145 lb 9.6 oz (66 kg)    GENERAL:alert, no distress and comfortable SKIN: skin color, texture, turgor are normal, no rashes or significant lesions EYES: normal, Conjunctiva are pink and non-injected, sclera clear  NECK: supple, thyroid normal size, non-tender, without nodularity LYMPH:  no palpable lymphadenopathy in the cervical, axillary  LUNGS: clear to auscultation and percussion with normal breathing effort (+) Very small Pleural effusion HEART: regular rate & rhythm and no murmurs and no lower extremity edema ABDOMEN:abdomen soft, non-tender and normal bowel sounds Musculoskeletal:no cyanosis of digits and no clubbing  NEURO: alert & oriented x 3 with fluent  speech, no focal motor/sensory deficits  LABORATORY DATA:  I have reviewed the data as listed CBC Latest Ref Rng & Units 03/16/2020 03/02/2020 02/24/2020  WBC 4.0 - 10.5 K/uL 8.0 3.8(L) 8.1  Hemoglobin 13.0 - 17.0 g/dL 8.9(L) 8.0(L) 9.1(L)  Hematocrit 39 - 52 % 26.7(L) 24.1(L) 27.9(L)  Platelets 150 - 400 K/uL 333 276 311     CMP Latest Ref Rng & Units 03/16/2020 03/02/2020 02/24/2020  Glucose 70 - 99 mg/dL 193(H) 98 270(H)  BUN 8 - 23 mg/dL 22 15 34(H)  Creatinine 0.61 - 1.24 mg/dL 1.60(H) 1.41(H) 1.87(H)  Sodium 135 - 145 mmol/L 140 135 134(L)  Potassium 3.5 - 5.1 mmol/L 4.3 3.9 4.4  Chloride 98 - 111 mmol/L 106 102 99  CO2 22 - 32 mmol/L _0 Calcium 8.9 - 10.3 mg/dL 9.3 8.9 9.6  Total Protein 6.5 - 8.1 g/dL 7.3 7.1 7.4  Total Bilirubin 0.3 - 1.2 mg/dL 0.5 0.4 0.4  Alkaline Phos 38 - 126 U/L 62 72 94  AST 15 - 41 U/L _1 ALT 0 - 44 U/L _2 RADIOGRAPHIC STUDIES: I have personally reviewed the radiological images as listed and agreed with the findings in the report. No results found.   ASSESSMENT & PLAN:  DIMITRY HOLSWORTH is a 74 y.o. Welch with    1. Extrahepatic cholangiocarcinoma, cTxN0Mx -He wasdiagnosedin 12/2018. His work up revealed malignant stricture and CBD obstruction required stenting. Brushing revealed adenocarcinoma. His extrahepatic cholangiocarcinoma was not resectable due to vascular invasion(surgeon - Dr. Zenia Resides at [facility] started first line chemogemcitabine and cisplatin on days 1 and 8 every 21 dayson6/12/20. Abraxane added with cycle 2.AddedGCSF ([name])to day 9 startingcycle 2.Abraxane stopped after C4 and chemo held9/3/20-10/2/20 due to poor toleration/hospitalization. He has improved with dose reduction.Cisplatin held starting with C13 due to decreased kidney function. -He met with surgeon Dr. Carlis Abbott on 11/22/19 who does not recommend surgery at this time given his decreased performance statusand technically difficult  resection. Will continue with same chemotherapy regimen to control his disease. -CT CAP from 02/03/20 shows stable disease, will continue single agent gemcitabine for now.  -S/p C17 he still is mainly effected by fatigue. He has stable breathing with mild SOB with moderate exertion.  -Labs reviewed, Hg 8.9. Overall adequate to proceed with C18D1 Gemcitabine today and D8 next week. -He is looking into clinical trails but nothing is available to him yet. I discussed the option of changing single agent Gemcitabine to every 2 weeks giving him more time to recover.  -I also discussed the option of target radiation to reduce tumor burden and control his disease so he can take a longer chemo break. I discussed given his aggressive disease, his response to  RT may vary and by only treating primary tumor any microscopic disease may progress when he is off chemo. We also discussed if his pleural effusion is related to his cancer, he will likely get worse when he is off chemo.  If he has cancer progression, I would restart chemo.  -He is planning a family vacation and high school reunion in August and this October. He is interested in RT so he can have longer treatment break to travel. I will set up consult with Dr Lisbeth Renshaw in 2 weeks to discuss this further. If he needs another cycle before proceeding with RT, will do every 2 weeks. I will also discuss his treatment options in GI Tumor Board in 2 weeks.  -F/u in 3 weeks    2.History of acute cholecystitis -He washospitalizedon 05/03/19.  -He had a percutaneous gallbladder drainage tube placed by IR8/5/20.It was removed on 9/8 due to leakage  -Hiscurrent abdominal pain is probably related to the procedure, I encouraged him to use pain medication as needed -No clinical high concern for acute cholecystitis we will continue to monitoring clinically   3.Right-sided pleural effusion -He underwent right thoracentesis with 2.1 is removedon 06/23/2019,  cytologynegative.The etiology is unclear, certainly malignancy is ahigh possibility. Hewas treated withcourse of antibiotics -His SOB has progressed lately and he has worsened right pleural effusion on 10/20/19 CT scan. -He had a Thoracentesis on 10/29/19 with 1.3L removed. Cytology showed reactive mesothelial cells, no malignancy.PET from 11/17/19 is not definitve for metastasis but still suspected, will monitor. -His breathing is stable. His pleural effusions appears very small on exam today (03/16/20)  4. Weight lossand low appetite -secondary to malignancyand chemo;he presented with 20 lbs weight loss  -Continue to f/u with dieticianand supplements -His appetitehas dropped significantly when he hadcholecystitis.  -Recently started Mirtazapine which is helping -His weight is fluctuating lately. He recently lost weight due to teeth extraction and liquid diet.   5. CAD, HL, HTN, DM -on amlodipine, atenolol, lisinopril, and statin. Continue tofollow up withcardiologist Dr. Ellyn Hack and PCP Dr Theda Sers.  -Heart function controlled and stable. DM not well controlled.    6. CKD stage III -his cr has been fluctuating lately -Cisplatin d/c after C13   PLAN: -Labs reviewed and adequate to proceed with C18D1 gemcitabine, will slightly reduce dose to 700 mg/m due to his fatigue -Lab, flush, and chemo in 1 and 3 weeks  -f/u in 3 weeks  -Tumor board discussion in 2 weeks, and will refer to radiation oncology Dr. Lisbeth Renshaw if RT will be offered    No problem-specific Assessment & Plan notes found for this encounter.   No orders of the defined types were placed in this encounter.  All questions were answered. The patient knows to call the clinic with any problems, questions or concerns. No barriers to learning was detected. The total time spent in the appointment was 30 minutes.     [name], MD 03/16/2020   I, Joslyn Devon, am acting as scribe for [name], MD.   I  have reviewed the above documentation for accuracy and completeness, and I agree with the above.

## 2020-03-16 ENCOUNTER — Inpatient Hospital Stay: Payer: Medicare Other

## 2020-03-16 ENCOUNTER — Encounter: Payer: Self-pay | Admitting: Hematology

## 2020-03-16 ENCOUNTER — Inpatient Hospital Stay (HOSPITAL_BASED_OUTPATIENT_CLINIC_OR_DEPARTMENT_OTHER): Payer: Medicare Other | Admitting: Hematology

## 2020-03-16 ENCOUNTER — Other Ambulatory Visit: Payer: Self-pay

## 2020-03-16 VITALS — BP 122/68 | HR 78 | Temp 97.7°F | Resp 17 | Ht 67.0 in | Wt 145.6 lb

## 2020-03-16 DIAGNOSIS — C221 Intrahepatic bile duct carcinoma: Secondary | ICD-10-CM

## 2020-03-16 DIAGNOSIS — Z5111 Encounter for antineoplastic chemotherapy: Secondary | ICD-10-CM | POA: Diagnosis not present

## 2020-03-16 DIAGNOSIS — Z95828 Presence of other vascular implants and grafts: Secondary | ICD-10-CM

## 2020-03-16 LAB — CMP (CANCER CENTER ONLY)
ALT: 15 U/L (ref 0–44)
AST: 18 U/L (ref 15–41)
Albumin: 3.4 g/dL — ABNORMAL LOW (ref 3.5–5.0)
Alkaline Phosphatase: 62 U/L (ref 38–126)
Anion gap: 9 (ref 5–15)
BUN: 22 mg/dL (ref 8–23)
CO2: 25 mmol/L (ref 22–32)
Calcium: 9.3 mg/dL (ref 8.9–10.3)
Chloride: 106 mmol/L (ref 98–111)
Creatinine: 1.6 mg/dL — ABNORMAL HIGH (ref 0.61–1.24)
GFR, Est AFR Am: 49 mL/min — ABNORMAL LOW (ref >60)
GFR, Estimated: 42 mL/min — ABNORMAL LOW (ref >60)
Glucose, Bld: 193 mg/dL — ABNORMAL HIGH (ref 70–99)
Potassium: 4.3 mmol/L (ref 3.5–5.1)
Sodium: 140 mmol/L (ref 135–145)
Total Bilirubin: 0.5 mg/dL (ref 0.3–1.2)
Total Protein: 7.3 g/dL (ref 6.5–8.1)

## 2020-03-16 LAB — CBC WITH DIFFERENTIAL (CANCER CENTER ONLY)
Abs Immature Granulocytes: 0.04 10*3/uL (ref 0.00–0.07)
Basophils Absolute: 0 10*3/uL (ref 0.0–0.1)
Basophils Relative: 0 %
Eosinophils Absolute: 0.1 10*3/uL (ref 0.0–0.5)
Eosinophils Relative: 2 %
HCT: 26.7 % — ABNORMAL LOW (ref 39.0–52.0)
Hemoglobin: 8.9 g/dL — ABNORMAL LOW (ref 13.0–17.0)
Immature Granulocytes: 1 %
Lymphocytes Relative: 23 %
Lymphs Abs: 1.9 10*3/uL (ref 0.7–4.0)
MCH: 30.9 pg (ref 26.0–34.0)
MCHC: 33.3 g/dL (ref 30.0–36.0)
MCV: 92.7 fL (ref 80.0–100.0)
Monocytes Absolute: 1.5 10*3/uL — ABNORMAL HIGH (ref 0.1–1.0)
Monocytes Relative: 19 %
Neutro Abs: 4.4 10*3/uL (ref 1.7–7.7)
Neutrophils Relative %: 55 %
Platelet Count: 333 10*3/uL (ref 150–400)
RBC: 2.88 MIL/uL — ABNORMAL LOW (ref 4.22–5.81)
RDW: 16.8 % — ABNORMAL HIGH (ref 11.5–15.5)
WBC Count: 8 10*3/uL (ref 4.0–10.5)
nRBC: 0 % (ref 0.0–0.2)

## 2020-03-16 LAB — MAGNESIUM: Magnesium: 2 mg/dL (ref 1.7–2.4)

## 2020-03-16 MED ORDER — PROCHLORPERAZINE MALEATE 10 MG PO TABS
10.0000 mg | ORAL_TABLET | Freq: Once | ORAL | Status: AC
  Administered 2020-03-16: 10 mg via ORAL

## 2020-03-16 MED ORDER — SODIUM CHLORIDE 0.9% FLUSH
10.0000 mL | INTRAVENOUS | Status: DC | PRN
  Filled 2020-03-16: qty 10

## 2020-03-16 MED ORDER — SODIUM CHLORIDE 0.9 % IV SOLN
Freq: Once | INTRAVENOUS | Status: AC
  Filled 2020-03-16: qty 250

## 2020-03-16 MED ORDER — SODIUM CHLORIDE 0.9 % IV SOLN
700.0000 mg/m2 | Freq: Once | INTRAVENOUS | Status: AC
  Administered 2020-03-16: 1254 mg via INTRAVENOUS
  Filled 2020-03-16: qty 32.98

## 2020-03-16 MED ORDER — HEPARIN SOD (PORK) LOCK FLUSH 100 UNIT/ML IV SOLN
500.0000 [IU] | Freq: Once | INTRAVENOUS | Status: AC | PRN
  Administered 2020-03-16: 500 [IU]
  Filled 2020-03-16: qty 5

## 2020-03-16 MED ORDER — PROCHLORPERAZINE MALEATE 10 MG PO TABS
ORAL_TABLET | ORAL | Status: AC
  Filled 2020-03-16: qty 1

## 2020-03-16 MED ORDER — SODIUM CHLORIDE 0.9% FLUSH
10.0000 mL | INTRAVENOUS | Status: DC | PRN
  Administered 2020-03-16: 10 mL
  Filled 2020-03-16: qty 10

## 2020-03-16 NOTE — Patient Instructions (Signed)
[facility] Discharge Instructions for Patients Receiving Chemotherapy  Today you received the following chemotherapy agents: gemcitabine.  To help prevent nausea and vomiting after your treatment, we encourage you to take your nausea medication as directed.   If you develop nausea and vomiting that is not controlled by your nausea medication, call the clinic.   BELOW ARE SYMPTOMS THAT SHOULD BE REPORTED IMMEDIATELY:  *FEVER GREATER THAN 100.5 F  *CHILLS WITH OR WITHOUT FEVER  NAUSEA AND VOMITING THAT IS NOT CONTROLLED WITH YOUR NAUSEA MEDICATION  *UNUSUAL SHORTNESS OF BREATH  *UNUSUAL BRUISING OR BLEEDING  TENDERNESS IN MOUTH AND THROAT WITH OR WITHOUT PRESENCE OF ULCERS  *URINARY PROBLEMS  *BOWEL PROBLEMS  UNUSUAL RASH Items with * indicate a potential emergency and should be followed up as soon as possible.  Feel free to call the clinic should you have any questions or concerns. The clinic phone number is [phone].  Please show the CHEMO ALERT CARD at check-in to the Emergency Department and triage nurse.   

## 2020-03-16 NOTE — Progress Notes (Signed)
Per Dr Burr Medico, ok to treat with creatinine 1.6 today.

## 2020-03-17 ENCOUNTER — Telehealth: Payer: Self-pay | Admitting: Hematology

## 2020-03-17 LAB — CANCER ANTIGEN 19-9: CA 19-9: 88 U/mL — ABNORMAL HIGH (ref 0–35)

## 2020-03-17 NOTE — Telephone Encounter (Signed)
Scheduled appt per 6/17 los.  Pt will get an updated appt calendar at their next scheduled appt.

## 2020-03-23 ENCOUNTER — Inpatient Hospital Stay: Payer: Medicare Other

## 2020-03-23 ENCOUNTER — Other Ambulatory Visit: Payer: Self-pay

## 2020-03-23 ENCOUNTER — Ambulatory Visit (HOSPITAL_BASED_OUTPATIENT_CLINIC_OR_DEPARTMENT_OTHER): Payer: Medicare Other | Admitting: Medical

## 2020-03-23 VITALS — BP 134/73 | HR 66 | Resp 16 | Wt 149.4 lb

## 2020-03-23 DIAGNOSIS — Z95828 Presence of other vascular implants and grafts: Secondary | ICD-10-CM

## 2020-03-23 DIAGNOSIS — C221 Intrahepatic bile duct carcinoma: Secondary | ICD-10-CM

## 2020-03-23 DIAGNOSIS — Z5111 Encounter for antineoplastic chemotherapy: Secondary | ICD-10-CM | POA: Diagnosis not present

## 2020-03-23 LAB — MAGNESIUM: Magnesium: 2.1 mg/dL (ref 1.7–2.4)

## 2020-03-23 LAB — CBC WITH DIFFERENTIAL (CANCER CENTER ONLY)
Abs Immature Granulocytes: 0.07 10*3/uL (ref 0.00–0.07)
Basophils Absolute: 0 10*3/uL (ref 0.0–0.1)
Basophils Relative: 1 %
Eosinophils Absolute: 0 10*3/uL (ref 0.0–0.5)
Eosinophils Relative: 1 %
HCT: 26.6 % — ABNORMAL LOW (ref 39.0–52.0)
Hemoglobin: 8.6 g/dL — ABNORMAL LOW (ref 13.0–17.0)
Immature Granulocytes: 1 %
Lymphocytes Relative: 38 %
Lymphs Abs: 2 10*3/uL (ref 0.7–4.0)
MCH: 30.3 pg (ref 26.0–34.0)
MCHC: 32.3 g/dL (ref 30.0–36.0)
MCV: 93.7 fL (ref 80.0–100.0)
Monocytes Absolute: 1.1 10*3/uL — ABNORMAL HIGH (ref 0.1–1.0)
Monocytes Relative: 21 %
Neutro Abs: 2 10*3/uL (ref 1.7–7.7)
Neutrophils Relative %: 38 %
Platelet Count: 305 10*3/uL (ref 150–400)
RBC: 2.84 MIL/uL — ABNORMAL LOW (ref 4.22–5.81)
RDW: 15.9 % — ABNORMAL HIGH (ref 11.5–15.5)
WBC Count: 5.3 10*3/uL (ref 4.0–10.5)
nRBC: 0 % (ref 0.0–0.2)

## 2020-03-23 LAB — CMP (CANCER CENTER ONLY)
ALT: 21 U/L (ref 0–44)
AST: 28 U/L (ref 15–41)
Albumin: 3.3 g/dL — ABNORMAL LOW (ref 3.5–5.0)
Alkaline Phosphatase: 57 U/L (ref 38–126)
Anion gap: 9 (ref 5–15)
BUN: 14 mg/dL (ref 8–23)
CO2: 26 mmol/L (ref 22–32)
Calcium: 9.4 mg/dL (ref 8.9–10.3)
Chloride: 105 mmol/L (ref 98–111)
Creatinine: 1.41 mg/dL — ABNORMAL HIGH (ref 0.61–1.24)
GFR, Est AFR Am: 57 mL/min — ABNORMAL LOW (ref >60)
GFR, Estimated: 49 mL/min — ABNORMAL LOW (ref >60)
Glucose, Bld: 166 mg/dL — ABNORMAL HIGH (ref 70–99)
Potassium: 4.4 mmol/L (ref 3.5–5.1)
Sodium: 140 mmol/L (ref 135–145)
Total Bilirubin: 0.5 mg/dL (ref 0.3–1.2)
Total Protein: 7.2 g/dL (ref 6.5–8.1)

## 2020-03-23 MED ORDER — PROCHLORPERAZINE MALEATE 10 MG PO TABS
10.0000 mg | ORAL_TABLET | Freq: Once | ORAL | Status: AC
  Administered 2020-03-23: 10 mg via ORAL

## 2020-03-23 MED ORDER — SODIUM CHLORIDE 0.9 % IV SOLN
700.0000 mg/m2 | Freq: Once | INTRAVENOUS | Status: AC
  Administered 2020-03-23: 1254 mg via INTRAVENOUS
  Filled 2020-03-23: qty 32.98

## 2020-03-23 MED ORDER — SODIUM CHLORIDE 0.9 % IV SOLN
Freq: Once | INTRAVENOUS | Status: AC
  Filled 2020-03-23: qty 250

## 2020-03-23 MED ORDER — PROCHLORPERAZINE MALEATE 10 MG PO TABS
ORAL_TABLET | ORAL | Status: AC
  Filled 2020-03-23: qty 1

## 2020-03-23 MED ORDER — SODIUM CHLORIDE 0.9% FLUSH
10.0000 mL | INTRAVENOUS | Status: DC | PRN
  Administered 2020-03-23: 10 mL
  Filled 2020-03-23: qty 10

## 2020-03-23 MED ORDER — HEPARIN SOD (PORK) LOCK FLUSH 100 UNIT/ML IV SOLN
500.0000 [IU] | Freq: Once | INTRAVENOUS | Status: AC | PRN
  Administered 2020-03-23: 500 [IU]
  Filled 2020-03-23: qty 5

## 2020-03-23 NOTE — Patient Instructions (Signed)
[facility] Discharge Instructions for Patients Receiving Chemotherapy  Today you received the following chemotherapy agents: gemcitabine.  To help prevent nausea and vomiting after your treatment, we encourage you to take your nausea medication as directed.   If you develop nausea and vomiting that is not controlled by your nausea medication, call the clinic.   BELOW ARE SYMPTOMS THAT SHOULD BE REPORTED IMMEDIATELY:  *FEVER GREATER THAN 100.5 F  *CHILLS WITH OR WITHOUT FEVER  NAUSEA AND VOMITING THAT IS NOT CONTROLLED WITH YOUR NAUSEA MEDICATION  *UNUSUAL SHORTNESS OF BREATH  *UNUSUAL BRUISING OR BLEEDING  TENDERNESS IN MOUTH AND THROAT WITH OR WITHOUT PRESENCE OF ULCERS  *URINARY PROBLEMS  *BOWEL PROBLEMS  UNUSUAL RASH Items with * indicate a potential emergency and should be followed up as soon as possible.  Feel free to call the clinic should you have any questions or concerns. The clinic phone number is [phone].  Please show the CHEMO ALERT CARD at check-in to the Emergency Department and triage nurse.   

## 2020-03-24 NOTE — Progress Notes (Signed)
Christopher Welch was seen in the infusion room today.  He reports that he had right upper quadrant pain after his last cycle of chemotherapy.  He had to take oxycodone IR for his abdominal pain.  He was offered a refill of this medication but states that he has enough at home.  He was told to continue using this as needed.  He expressed understanding and agreement with this plan.  Sandi Mealy, MHS, PA-C Physician Assistant

## 2020-03-29 ENCOUNTER — Other Ambulatory Visit: Payer: Self-pay

## 2020-03-31 NOTE — Progress Notes (Signed)
Christopher Welch   Telephone:([phone] Fax:([phone]) [phone]   Clinic Follow up Note   Patient Care Team: [name], DO as PCP - General (Family Medicine)  Date of Service:  04/07/2020  CHIEF COMPLAINT: F/u of ExtrahepaticCholangiocarcinoma  SUMMARY OF ONCOLOGIC HISTORY: Oncology History  Cholangiocarcinoma ([place])  01/14/2019 Imaging   US Abdomen 01/14/19  IMPRESSION: There is significant intrahepatic and extrahepatic biliary dilatation present, concerning for distal common bile duct obstruction. Further evaluation with CT scan or MRCP is recommended. Correlation with liver function tests is recommended as well. These results will be called to the ordering clinician or representative by the Radiologist Assistant, and communication documented in the PACS or zVision Dashboard.   Probable large amount of sludge seen within gallbladder lumen with mild gallbladder wall thickening. However, no cholelithiasis, pericholecystic fluid or sonographic Murphy's sign is noted.   4.2 cm septated cyst seen in upper pole of right kidney consistent with Bosniak type 2 lesion. Follow-up ultrasound in 1 year is recommended to ensure stability.   01/20/2019 Imaging   MRI abdomen 01/20/19  IMPRESSION: 1. Findings are highly concerning for central tumor in the biliary tract at the confluence of the common hepatic duct, cystic duct and common bile ducts. Further clinical evaluation for potential cholangiocarcinoma is strongly recommended. 2. Mild ductal dilatation of the main pancreatic duct throughout the distal body and tail of the pancreas where there is also some associated side branch ectasia. This may suggest a pancreatic ductal stricture. No obstructing pancreatic neoplasm identified. 3. Aortic atherosclerosis.   01/21/2019 Initial Biopsy   Diagnosis 01/21/19  BILE DUCT BRUSHING (SPECIMEN 1 OF 1 COLLECTED 01/21/2019) ADENOCARCINOMA.   01/21/2019 Procedure   ERCP By Dr hung  01/21/19 IMRPESSION - The major papilla appeared normal. - A single localized biliary stricture was found in the middle third of the main bile duct. - The entire main bile duct and upper third of the main bile duct were dilated, secondary to a stricture. - A biliary sphincterotomy was performed. - Cells for cytology obtained in the middle third of the main bile duct. - One plastic stent was placed into the common bile duct.  EUS by Dr hung 01/21/19  IMPRESSION - There was dilation in the middle third of the main bile duct and in the upper third of the main bile duct which measured up to 15 mm. - There was a suggestion of a stricture in the middle third of the main bile duct. - No specimens collected.   02/03/2019 Imaging   CT CAP at [facility] 02/03/19  Impression:  1. There is mild intrahepatic and extrahepatic biliary ductal dilation and diffuse common bile duct wall thickening with stent in place. There is abnormal soft tissue measuring approximately 1.6 cm between the common hepatic artery, portal vein and common bile duct, worrisome for the known cholangiocarcinoma.  2. Periportal lymphadenopathy which may represent nodal metastasis.  4. Marked pancreatic atrophy and duct dilation involving the distal body and tail the pancreas where there is a coarse calcification. These findings are favored to represent stenosis from intraductal stones though an underlying stricture cannot be completely excluded.  5. Atypical symmetric prominent fat stranding of the bilateral lower abdominal wall. Correlate with surgical history or history of history of trauma.   03/08/2019 Initial Diagnosis   Cholangiocarcinoma ([place])   03/12/2019 -  Chemotherapy   Gemcitabine and cisplatin on days 1 and 8 every 21 days starting 03/12/19. Abraxane added with cycle 2. Added GCSF [name]) to  day 9 starting cycle 2.  Abraxane stopped after C4 and chemo held 06/03/19-07/02/19 due to poor toleration/hospitalization.  Cisplatin removed starting from cycle 13 due to renal insufficiency.    03/19/2019 Cancer Staging   Staging form: Perihilar Bile Ducts, AJCC 8th Edition - Clinical: Stage Unknown (cTX, cN0, cM0) - Signed by [name], MD on 03/19/2019   05/24/2019 Imaging   CT CAP W Contrast at [facility]  1.  Interval placement of covered metallic biliary stent. No progressive dilation of the biliary tree and resulting resolution of the prior main pancreatic duct dilation. 2.  Evidence of some subtle vascular contour deformity involving the hepatic artery, suspected to be from tumoral contact and/or posttreatment changes. 3.  New small right pleural effusion with overlying mild airspace disease. Secondary findings which may indicate a component of aspiration. 4.  No convincing evidence of new disseminated metastatic disease. 5.  Additional findings as discussed above.   06/18/2019 Imaging   CT AP IMPRESSION: 1.  No acute findings in the abdomen/pelvis. 2. Biliary stent in adequate position. No evidence of focal mass or adenopathy in the region of the pancreatic head or porta hepatis. 3. Moderate size right pleural effusion with associated right basilar atelectasis. 4. Possible single gallstone. Stable subcentimeter liver cyst. Stable right renal cysts. Small left inguinal hernia containing only peritoneal fat. 5. Aortic Atherosclerosis (ICD10-I70.0).   07/28/2019 Imaging   CT CAP IMPRESSION: 1. The patient is status post common bile duct stent placement, with unchanged stent position and patent appearance with pneumobilia. Primary cholangiocarcinoma is not discretely appreciated by CT. 2. No direct evidence of metastatic disease in the chest, abdomen, or pelvis. 3. Moderate right pleural effusion with associated atelectasis or consolidation, slightly improved compared to prior examination. 4. The pancreatic parenchyma is atrophic and calcified, particularly in the pancreatic tail. 5.  Coronary artery  disease.  Aortic atherosclerosis   10/20/2019 PET scan   IMPRESSION: 1. Redemonstrated post treatment and post stenting appearance of a central cholangiocarcinoma, which is not directly appreciated by CT. Common bile duct stent remains in position with post stenting pneumobilia. 2. Slight interval increase in a moderate to large right pleural effusion with associated atelectasis or consolidation. There may be some pleural nodularity about the right hemidiaphragm which appears new compared to prior examination (series 2, image 40), highly suspicious for pleural metastatic disease. 3. No direct evidence of metastatic disease in the chest, abdomen, or pelvis. 4. Coronary artery disease.  Aortic Atherosclerosis (ICD10-I70.0).     10/29/2019 Pathology Results   Thoracentesis FINAL MICROSCOPIC DIAGNOSIS:  - Reactive mesothelial cells present    11/17/2019 PET scan   IMPRESSION: 1. Low-level hypermetabolism within the right pleural space, without focal abnormality to strongly suggest pleural metastasis. 2. Hypermetabolism along the course of the common duct stent, nonspecific and most likely reactive. 3. Otherwise, no evidence of hypermetabolic metastasis. 4. Small to moderate right pleural effusion with developing loculation superiorly. 5. Coronary artery atherosclerosis. Aortic Atherosclerosis (ICD10-I70.0). 6.  Possible constipation.     02/03/2020 Imaging   CT CAP IMPRESSION: 1. Stable indistinct circumferential biliary wall thickening in the proximal CBD, without discrete biliary mass. Stable well-positioned CBD stent with intrahepatic pneumobilia indicating patency. 2. No findings of metastatic disease in the abdomen or pelvis. 3. Small dependent right pleural effusion is decreased. Mild diffuse smooth right pleural thickening is similar to slightly increased, nonspecific, cannot exclude malignant pleural effusion. No discrete pleural nodularity. 4. Tiny clustered tree-in-bud  type nodules in the anterior left upper  lobe, slightly increased, nonspecific, more likely inflammatory. Recommend attention on follow-up chest CT in 3 months. 5. Chronic findings include: Chronic pancreatitis. Coronary atherosclerosis. Aortic Atherosclerosis (ICD10-I70.0).     02/21/2020 Imaging   US Abdomen  IMPRESSION: Mildly distended gallbladder without visible wall thickening or stones.   Common bile duct stent in place. Slight prominence of intrahepatic biliary ducts in the left hepatic lobe.   Increased echotexture of the liver suggesting fatty infiltration.      CURRENT THERAPY:  Gemcitabine and cisplatin on days 1 and 8 every 21 daysstarting 03/12/19. Abraxane added with cycle 2.AddedGCSF ([name])to day 9 startingcycle 2. Abraxane stopped after C4 and chemo held9/3/20-10/2/20 due to poor toleration/hospitalization. Cisplatin removed starting from cycle13due to renal insufficiency.  INTERVAL HISTORY:  Christopher Welch is here for a follow up and treatment. He presents to the clinic alone. He notes his mid  abdominal pain has worsened in the past week along with more back and leg pain. He also notes having struggle ambulating due to leg weakness. He notes he has to walk very slow. He notes he has been losing weight. He notes his SOB is stable currently. He notes his appetite is low but tried to make himself eat 3 meals a day and protein shakes. He notes night sweats but no fever. For pain he has been using Oxycodone but tries to not take it, which can get 5-6/10. Oxycodone takes 40 minutes to take effect and will last several hours for him.     REVIEW OF SYSTEMS:   Constitutional: Denies fevers, chills (+) weight loss (+) Low appetite (+) Night sweats Eyes: Denies blurriness of vision Ears, nose, mouth, throat, and face: Denies mucositis or sore throat Respiratory: Denies cough, wheezes (+) Stable SOB upon mild exertion Cardiovascular: Denies palpitation, chest  discomfort or lower extremity swelling Gastrointestinal:  Denies nausea, heartburn or change in bowel habits (+) Mid abdominal pain  Skin: Denies abnormal skin rashes MSK: (+) Back pain and upper leg pain Lymphatics: Denies new lymphadenopathy or easy bruising Neurological:Denies numbness, tingling (+) LE weaknesses Behavioral/Psych: Mood is stable, no new changes  All other systems were reviewed with the patient and are negative.  MEDICAL HISTORY:  Past Medical History:  Diagnosis Date  . Diabetes mellitus type I, controlled ([place])   . Dyslipidemia   . ED (erectile dysfunction)   . Essential hypertension   . Gilbert's disease   . Hepatitis B carrier [facility])     SURGICAL HISTORY: Past Surgical History:  Procedure Laterality Date  . APPENDECTOMY  1964  . BILIARY BRUSHING  01/21/2019   Procedure: BILIARY BRUSHING;  Surgeon: Carol Ada, MD;  Location: [facility];  Service: Endoscopy;;  . BILIARY STENT PLACEMENT N/A 01/21/2019   Procedure: BILIARY STENT PLACEMENT;  Surgeon: Carol Ada, MD;  Location: [facility];  Service: Endoscopy;  Laterality: N/A;  . ENDOSCOPIC RETROGRADE CHOLANGIOPANCREATOGRAPHY (ERCP) WITH PROPOFOL N/A 01/21/2019   Procedure: ENDOSCOPIC RETROGRADE CHOLANGIOPANCREATOGRAPHY (ERCP) WITH PROPOFOL;  Surgeon: Carol Ada, MD;  Location: [facility];  Service: Endoscopy;  Laterality: N/A;  . ESOPHAGOGASTRODUODENOSCOPY (EGD) WITH PROPOFOL N/A 01/21/2019   Procedure: ESOPHAGOGASTRODUODENOSCOPY (EGD) WITH PROPOFOL;  Surgeon: Carol Ada, MD;  Location: [facility];  Service: Endoscopy;  Laterality: N/A;  . FEMORAL HERNIA REPAIR    . IR CHOLANGIOGRAM EXISTING TUBE  05/13/2019  . IR CHOLANGIOGRAM EXISTING TUBE  06/08/2019  . IR EXCHANGE BILIARY DRAIN  05/18/2019  . IR EXCHANGE BILIARY DRAIN  06/08/2019  . IR IMAGING GUIDED PORT INSERTION  03/08/2019  .  IR PERC CHOLECYSTOSTOMY  05/04/2019  . IR THORACENTESIS ASP PLEURAL SPACE W/IMG GUIDE  10/29/2019  . LEFT HEART CATH AND  CORONARY ANGIOGRAPHY N/A 05/19/2017   Procedure: LEFT HEART CATH AND CORONARY ANGIOGRAPHY;  Surgeon: Leonie Man, MD;  Location: [facility];  Service: Cardiovascular;  Laterality: N/A;  . SPHINCTEROTOMY  01/21/2019   Procedure: SPHINCTEROTOMY;  Surgeon: Carol Ada, MD;  Location: [facility];  Service: Endoscopy;;  . UPPER ESOPHAGEAL ENDOSCOPIC ULTRASOUND (EUS) N/A 01/21/2019   Procedure: UPPER ESOPHAGEAL ENDOSCOPIC ULTRASOUND (EUS);  Surgeon: Carol Ada, MD;  Location: [facility];  Service: Endoscopy;  Laterality: N/A;    I have reviewed the social history and family history with the patient and they are unchanged from previous note.  ALLERGIES:  is allergic to erythromycin.  MEDICATIONS:  Current Outpatient Medications  Medication Sig Dispense Refill  . acetaminophen (TYLENOL) 500 MG tablet Take 1,000 mg by mouth 2 (two) times daily as needed for moderate pain or headache.    Marland Kitchen amLODipine (NORVASC) 5 MG tablet Take 5 mg by mouth daily.     Marland Kitchen atenolol (TENORMIN) 25 MG tablet Take 25 mg by mouth daily. Once a day     . atorvastatin (LIPITOR) 10 MG tablet Take 10 mg by mouth daily.    . Blood Glucose Monitoring Suppl (ACCU-CHEK NANO SMARTVIEW) W/DEVICE KIT 1 kit by Does not apply route 2 (two) times daily. (Patient taking differently: 1 kit by Does not apply route See admin instructions. Test blood sugars 12x's daily) 1 kit 0  . clonazePAM (KLONOPIN) 0.5 MG tablet     . glucose blood test strip Test 3 times a day. (Patient taking differently: 1 each by Other route See admin instructions. Test 12 times a day.) 300 each Prn  . insulin NPH Human (NOVOLIN N) 100 UNIT/ML injection Inject 20 Units into the skin at bedtime.     . insulin regular (NOVOLIN R) 100 units/mL injection Inject 20 Units into the skin 3 (three) times daily before meals.     . lidocaine-prilocaine (EMLA) cream Apply to affected area once 30 g 3  . lisinopril (ZESTRIL) 20 MG tablet Take 20 mg by mouth daily.      Marland Kitchen LORazepam (ATIVAN) 0.5 MG tablet Take 0.5-1 tablets (0.25-0.5 mg total) by mouth every 8 (eight) hours as needed (nausea and vomiting). 30 tablet 0  . magnesium oxide (MAG-OX) 400 MG tablet TAKE 1 TABLET BY MOUTH EVERY DAY 90 tablet 1  . metFORMIN (GLUCOPHAGE) 1000 MG tablet Take 2,000 mg by mouth every evening.     . mirtazapine (REMERON) 7.5 MG tablet TAKE 1 TABLET (7.5 MG TOTAL) BY MOUTH AT BEDTIME. 90 tablet 1  . ondansetron (ZOFRAN) 8 MG tablet TAKE 1 TABLET BY MOUTH 2 TIMES DAY AS NEEDED. START ON 3RD DAY AFTER CHEMOTHERAPY 30 tablet 1  . oxycodone (OXY-IR) 5 MG capsule Take 1 capsule (5 mg total) by mouth every 6 (six) hours as needed for pain (severe pain). 40 capsule 0  . pantoprazole (PROTONIX) 40 MG tablet Take 1 tablet (40 mg total) by mouth daily. 30 tablet 0  . prochlorperazine (COMPAZINE) 10 MG tablet TAKE 1 TABLET (10 MG TOTAL) BY MOUTH EVERY 6 (SIX) HOURS AS NEEDED (NAUSEA OR VOMITING). 30 tablet 1  . zolpidem (AMBIEN) 5 MG tablet Take 1-2 tablets (5-10 mg total) by mouth at bedtime as needed for sleep. 60 tablet 0   No current facility-administered medications for this visit.   Facility-Administered Medications Ordered  in Other Visits  Medication Dose Route Frequency Provider Last Rate Last Admin  . 0.9 %  sodium chloride infusion (Manually program via Guardrails IV Fluids)  250 mL Intravenous Once [name], MD        PHYSICAL EXAMINATION: ECOG PERFORMANCE STATUS: 3 - Symptomatic, >50% confined to bed  Vitals:   04/07/20 0817  BP: 112/61  Pulse: 80  Resp: 20  Temp: 98.1 F (36.7 C)  SpO2: 100%   Filed Weights   04/07/20 0817  Weight: 147 lb 12.8 oz (67 kg)    GENERAL:alert, no distress and comfortable SKIN: skin color, texture, turgor are normal, no rashes or significant lesions EYES: normal, Conjunctiva are pink and non-injected, sclera clear  NECK: supple, thyroid normal size, non-tender, without nodularity LYMPH:  no palpable lymphadenopathy in the  cervical, axillary  LUNGS: clear to auscultation and percussion (+) Very mild pleural effusion HEART: regular rate & rhythm and no murmurs and no lower extremity edema ABDOMEN:abdomen soft, non-tender and normal bowel sounds Musculoskeletal:no cyanosis of digits and no clubbing  NEURO: alert & oriented x 3 with fluent speech, no focal motor/sensory deficits EXAM DONE IN WHEELCHAIR  LABORATORY DATA:  I have reviewed the data as listed CBC Latest Ref Rng & Units 04/07/2020 03/23/2020 03/16/2020  WBC 4.0 - 10.5 K/uL 7.4 5.3 8.0  Hemoglobin 13.0 - 17.0 g/dL 9.2(L) 8.6(L) 8.9(L)  Hematocrit 39 - 52 % 28.5(L) 26.6(L) 26.7(L)  Platelets 150 - 400 K/uL 270 305 333     CMP Latest Ref Rng & Units 04/07/2020 03/23/2020 03/16/2020  Glucose 70 - 99 mg/dL 271(H) 166(H) 193(H)  BUN 8 - 23 mg/dL 27(H) 14 22  Creatinine 0.61 - 1.24 mg/dL 1.79(H) 1.41(H) 1.60(H)  Sodium 135 - 145 mmol/L 137 140 140  Potassium 3.5 - 5.1 mmol/L 4.5 4.4 4.3  Chloride 98 - 111 mmol/L 103 105 106  CO2 22 - 32 mmol/L _0 Calcium 8.9 - 10.3 mg/dL 9.7 9.4 9.3  Total Protein 6.5 - 8.1 g/dL 7.6 7.2 7.3  Total Bilirubin 0.3 - 1.2 mg/dL 0.6 0.5 0.5  Alkaline Phos 38 - 126 U/L 62 57 62  AST 15 - 41 U/L _1 ALT 0 - 44 U/L _2 RADIOGRAPHIC STUDIES: I have personally reviewed the radiological images as listed and agreed with the findings in the report. No results found.   ASSESSMENT & PLAN:  Christopher Welch is a 74 y.o. male with   1. Extrahepatic cholangiocarcinoma, cTxN0Mx -He wasdiagnosedin 12/2018. His work up revealed malignant stricture and CBD obstruction required stenting. Brushing revealed adenocarcinoma. His extrahepatic cholangiocarcinoma was not resectable due to vascular invasion(surgeon - Dr. Zenia Resides at [place] started first line chemogemcitabine and cisplatin on days 1 and 8 every 21 dayson6/12/20. Abraxane added with cycle 2.AddedGCSF ([name])to day 9 startingcycle 2.Abraxane  stopped after C4 and chemo held9/3/20-10/2/20 due to poor toleration/hospitalization. He has improved with dose reduction.Cisplatin held starting with C13 due to decreased kidney function. -He met with surgeon Dr. Carlis Abbott on 2/22/21who does not recommend surgery at this time given his decreased performance statusand technically difficult resection. Will continue with same chemotherapy regimen to control his disease. -CT CAP from 5/6/21shows stable disease, will continue single agent gemcitabine for now.  -He is looking into clinical trails but nothing is available to him yet. I discussed the option of changing single agent Gemcitabine to every 2 weeks giving him more time to recover. Will consider  after next scan. -I previously discussed the option of target radiation to reduce tumor burden and control his disease so he can take a longer chemo break. I discussed this with Dr. Lisbeth Renshaw who notes he can proceed based on next scan.  -S/p C18 he has experienced increased mid abdominal pain that radiates to his back and upper legs leading to LE weakness and slower ambulation. He has more fatigue and stable SOB.  -Labs reviewed, Hg 9.2, BG 271, BUN 27, Cr 1.79. Given his increased pain and decreased performance status this week, will hold C19 chemo this week. He is agreeable.  -Will repeat CT CAP next week.  -F/u next week    2. Mid abdominal pain  -Secondary to #1 he has had mild abdominal pain, and rarely needed oxycodone.  -In the past week he has had worsened mid abdominal pain that radiates to back and upper legs and leads to LE weakness. He has no signs of infection or Jaundice.  -I discussed it is fine to increase Oxycodone use to 1-2 times daily for pain 5-6/10 or higher.  -I discussed etiology of this pain is unclear. I encouraged him to increase Doculax as constipation can be related and pain medication use can exacerbate his constipation. He is agreeable.  -I will also call in Protonix for  possible irritation.    3.History of acute cholecystitis -He washospitalizedon 05/03/19.  -He had a percutaneous gallbladder drainage tube placed by IR8/5/20.It was removed on 9/8 due to leakage  -Hiscurrent abdominal pain is probably related to the procedure, I encouraged him to use pain medication as needed -No clinical high concern for acute cholecystitis we will continue to monitoring clinically   3.Right-sided pleural effusion -He underwent right thoracentesis with 2.1 is removedon 06/23/2019, cytologynegative.The etiology is unclear, certainly malignancy is ahigh possibility. Hewas treated withcourse of antibiotics -His SOB had progressed and he has worsened right pleural effusion on 10/20/19 CT scan. -He had a Thoracentesis on 10/29/19 with 1.3L removed. Cytology showed reactive mesothelial cells, no malignancy.PET from 11/17/19 is not definitve for metastasis but still suspected, will monitor. -His breathing and SOB is stable. His pleural effusions remain very small on exam (04/07/20).   4. Weight lossand low appetite -secondary to malignancyand chemo;he presented with 20 lbs weight loss  -Continue to f/u with dieticianand supplements -His appetitehas dropped significantly when he hadcholecystitis.  -Recently started Mirtazapine which is helping -His weight is fluctuating lately, but overall trending down. He has little to no appetite but makes sure he eats small 3 meals and Protein shakes. He will drink moderately with each meal.    5. CAD, HL, HTN, DM -on amlodipine, atenolol, lisinopril, and statin. Continue tofollow up withcardiologist Dr. Ellyn Hack and PCP Dr Theda Sers.  -Heart function controlled and stable. DM not well controlled.    6. CKD stage III -his cr has been fluctuating lately -Cisplatin d/c after C13   PLAN: -No chemo today due to his poor PS  -Will give IV Fluids today  -I called in Protonix today  -phone visit next week after  CT CAP wo contrast (w oral contrast) a few days before.    No problem-specific Assessment & Plan notes found for this encounter.   Orders Placed This Encounter  Procedures  . CT Abdomen Pelvis Wo Contrast    Standing Status:   Future    Standing Expiration Date:   04/07/2021    Order Specific Question:   Preferred imaging location?    Answer:  [facility]    Order Specific Question:   Release to patient    Answer:   Immediate    Order Specific Question:   Is Oral Contrast requested for this exam?    Answer:   Yes, Per Radiology protocol    Order Specific Question:   Radiology Contrast Protocol - do NOT remove file path    Answer:   \\[email or web address]  . CT Chest Wo Contrast    Standing Status:   Future    Standing Expiration Date:   04/07/2021    Order Specific Question:   Preferred imaging location?    Answer:   [facility]    Order Specific Question:   Radiology Contrast Protocol - do NOT remove file path    Answer:   \\[email or web address]   All questions were answered. The patient knows to call the clinic with any problems, questions or concerns. No barriers to learning was detected. The total time spent in the appointment was 30 minutes.     [name], MD 04/07/2020   I, Joslyn Devon, am acting as scribe for [name], MD.   I have reviewed the above documentation for accuracy and completeness, and I agree with the above.

## 2020-04-07 ENCOUNTER — Other Ambulatory Visit: Payer: Self-pay

## 2020-04-07 ENCOUNTER — Inpatient Hospital Stay: Payer: Medicare Other

## 2020-04-07 ENCOUNTER — Encounter: Payer: Self-pay | Admitting: Hematology

## 2020-04-07 ENCOUNTER — Inpatient Hospital Stay: Payer: Medicare Other | Attending: Hematology

## 2020-04-07 ENCOUNTER — Inpatient Hospital Stay (HOSPITAL_BASED_OUTPATIENT_CLINIC_OR_DEPARTMENT_OTHER): Payer: Medicare Other | Admitting: Hematology

## 2020-04-07 VITALS — BP 112/61 | HR 80 | Temp 98.1°F | Resp 20 | Ht 67.0 in | Wt 147.8 lb

## 2020-04-07 DIAGNOSIS — E1040 Type 1 diabetes mellitus with diabetic neuropathy, unspecified: Secondary | ICD-10-CM | POA: Insufficient documentation

## 2020-04-07 DIAGNOSIS — Z794 Long term (current) use of insulin: Secondary | ICD-10-CM | POA: Insufficient documentation

## 2020-04-07 DIAGNOSIS — N183 Chronic kidney disease, stage 3 unspecified: Secondary | ICD-10-CM | POA: Insufficient documentation

## 2020-04-07 DIAGNOSIS — C221 Intrahepatic bile duct carcinoma: Secondary | ICD-10-CM

## 2020-04-07 DIAGNOSIS — I129 Hypertensive chronic kidney disease with stage 1 through stage 4 chronic kidney disease, or unspecified chronic kidney disease: Secondary | ICD-10-CM | POA: Insufficient documentation

## 2020-04-07 DIAGNOSIS — R531 Weakness: Secondary | ICD-10-CM | POA: Diagnosis not present

## 2020-04-07 DIAGNOSIS — R61 Generalized hyperhidrosis: Secondary | ICD-10-CM | POA: Diagnosis not present

## 2020-04-07 DIAGNOSIS — Z95828 Presence of other vascular implants and grafts: Secondary | ICD-10-CM

## 2020-04-07 DIAGNOSIS — J90 Pleural effusion, not elsewhere classified: Secondary | ICD-10-CM | POA: Diagnosis not present

## 2020-04-07 DIAGNOSIS — K5900 Constipation, unspecified: Secondary | ICD-10-CM | POA: Diagnosis not present

## 2020-04-07 DIAGNOSIS — I2510 Atherosclerotic heart disease of native coronary artery without angina pectoris: Secondary | ICD-10-CM | POA: Diagnosis not present

## 2020-04-07 DIAGNOSIS — E1022 Type 1 diabetes mellitus with diabetic chronic kidney disease: Secondary | ICD-10-CM | POA: Insufficient documentation

## 2020-04-07 DIAGNOSIS — Z8719 Personal history of other diseases of the digestive system: Secondary | ICD-10-CM | POA: Insufficient documentation

## 2020-04-07 DIAGNOSIS — C240 Malignant neoplasm of extrahepatic bile duct: Secondary | ICD-10-CM | POA: Diagnosis not present

## 2020-04-07 DIAGNOSIS — R109 Unspecified abdominal pain: Secondary | ICD-10-CM | POA: Insufficient documentation

## 2020-04-07 DIAGNOSIS — R634 Abnormal weight loss: Secondary | ICD-10-CM | POA: Insufficient documentation

## 2020-04-07 LAB — CBC WITH DIFFERENTIAL (CANCER CENTER ONLY)
Abs Immature Granulocytes: 0.02 10*3/uL (ref 0.00–0.07)
Basophils Absolute: 0 10*3/uL (ref 0.0–0.1)
Basophils Relative: 0 %
Eosinophils Absolute: 0.1 10*3/uL (ref 0.0–0.5)
Eosinophils Relative: 2 %
HCT: 28.5 % — ABNORMAL LOW (ref 39.0–52.0)
Hemoglobin: 9.2 g/dL — ABNORMAL LOW (ref 13.0–17.0)
Immature Granulocytes: 0 %
Lymphocytes Relative: 31 %
Lymphs Abs: 2.3 10*3/uL (ref 0.7–4.0)
MCH: 30.3 pg (ref 26.0–34.0)
MCHC: 32.3 g/dL (ref 30.0–36.0)
MCV: 93.8 fL (ref 80.0–100.0)
Monocytes Absolute: 1.3 10*3/uL — ABNORMAL HIGH (ref 0.1–1.0)
Monocytes Relative: 17 %
Neutro Abs: 3.7 10*3/uL (ref 1.7–7.7)
Neutrophils Relative %: 50 %
Platelet Count: 270 10*3/uL (ref 150–400)
RBC: 3.04 MIL/uL — ABNORMAL LOW (ref 4.22–5.81)
RDW: 15.8 % — ABNORMAL HIGH (ref 11.5–15.5)
WBC Count: 7.4 10*3/uL (ref 4.0–10.5)
nRBC: 0 % (ref 0.0–0.2)

## 2020-04-07 LAB — CMP (CANCER CENTER ONLY)
ALT: 17 U/L (ref 0–44)
AST: 21 U/L (ref 15–41)
Albumin: 3.6 g/dL (ref 3.5–5.0)
Alkaline Phosphatase: 62 U/L (ref 38–126)
Anion gap: 10 (ref 5–15)
BUN: 27 mg/dL — ABNORMAL HIGH (ref 8–23)
CO2: 24 mmol/L (ref 22–32)
Calcium: 9.7 mg/dL (ref 8.9–10.3)
Chloride: 103 mmol/L (ref 98–111)
Creatinine: 1.79 mg/dL — ABNORMAL HIGH (ref 0.61–1.24)
GFR, Est AFR Am: 43 mL/min — ABNORMAL LOW (ref >60)
GFR, Estimated: 37 mL/min — ABNORMAL LOW (ref >60)
Glucose, Bld: 271 mg/dL — ABNORMAL HIGH (ref 70–99)
Potassium: 4.5 mmol/L (ref 3.5–5.1)
Sodium: 137 mmol/L (ref 135–145)
Total Bilirubin: 0.6 mg/dL (ref 0.3–1.2)
Total Protein: 7.6 g/dL (ref 6.5–8.1)

## 2020-04-07 LAB — MAGNESIUM: Magnesium: 2.1 mg/dL (ref 1.7–2.4)

## 2020-04-07 MED ORDER — PANTOPRAZOLE SODIUM 40 MG PO TBEC
40.0000 mg | DELAYED_RELEASE_TABLET | Freq: Every day | ORAL | 0 refills | Status: DC
Start: 2020-04-07 — End: 2020-05-01

## 2020-04-07 MED ORDER — SODIUM CHLORIDE 0.9% FLUSH
10.0000 mL | INTRAVENOUS | Status: DC | PRN
  Administered 2020-04-07: 10 mL
  Filled 2020-04-07: qty 10

## 2020-04-07 MED ORDER — SODIUM CHLORIDE 0.9 % IV SOLN
INTRAVENOUS | Status: AC
  Filled 2020-04-07 (×2): qty 250

## 2020-04-08 LAB — CANCER ANTIGEN 19-9: CA 19-9: 111 U/mL — ABNORMAL HIGH (ref 0–35)

## 2020-04-10 ENCOUNTER — Telehealth: Payer: Self-pay | Admitting: Hematology

## 2020-04-10 NOTE — Telephone Encounter (Signed)
Scheduled per 7/9 los. Pt is aware of appt time and date. °

## 2020-04-12 ENCOUNTER — Other Ambulatory Visit: Payer: Self-pay

## 2020-04-12 ENCOUNTER — Ambulatory Visit (HOSPITAL_COMMUNITY)
Admission: RE | Admit: 2020-04-12 | Discharge: 2020-04-12 | Disposition: A | Discharge status: Home / Self Care | Payer: Medicare Other | Source: Ambulatory Visit | Attending: Hematology | Admitting: Hematology

## 2020-04-12 ENCOUNTER — Encounter (HOSPITAL_COMMUNITY): Payer: Self-pay

## 2020-04-12 DIAGNOSIS — C221 Intrahepatic bile duct carcinoma: Secondary | ICD-10-CM | POA: Diagnosis not present

## 2020-04-12 DIAGNOSIS — R0602 Shortness of breath: Secondary | ICD-10-CM | POA: Diagnosis not present

## 2020-04-12 NOTE — Progress Notes (Signed)
[place]   [phone] [phone]   Clinic Follow up Note   Patient Care Team: [name], DO as PCP - General (Family Medicine)   I connected with Christopher Welch on 04/14/2020 at  9:00 AM EDT by telephone visit and verified that I am speaking with the correct person using two identifiers.  I discussed the limitations, risks, security and privacy concerns of performing an evaluation and management service by telephone and the availability of in person appointments. I also discussed with the patient that there may be a patient responsible charge related to this service. The patient expressed understanding and agreed to proceed.   Other persons participating in the visit and their role in the encounter:  None  Patient's location:  His home  Provider's location:  My Office   CHIEF COMPLAINT: F/u of ExtrahepaticCholangiocarcinoma  SUMMARY OF ONCOLOGIC HISTORY: Oncology History  Cholangiocarcinoma ([place])  01/14/2019 Imaging   US Abdomen 01/14/19  IMPRESSION: There is significant intrahepatic and extrahepatic biliary dilatation present, concerning for distal common bile duct obstruction. Further evaluation with CT scan or MRCP is recommended. Correlation with liver function tests is recommended as well. These results will be called to the ordering clinician or representative by the Radiologist Assistant, and communication documented in the PACS or zVision Dashboard.   Probable large amount of sludge seen within gallbladder lumen with mild gallbladder wall thickening. However, no cholelithiasis, pericholecystic fluid or sonographic Murphy's sign is noted.   4.2 cm septated cyst seen in upper pole of right kidney consistent with Bosniak type 2 lesion. Follow-up ultrasound in 1 year is recommended to ensure stability.   01/20/2019 Imaging   MRI abdomen 01/20/19  IMPRESSION: 1. Findings are highly concerning for central tumor in the  biliary tract at the confluence of the common hepatic duct, cystic duct and common bile ducts. Further clinical evaluation for potential cholangiocarcinoma is strongly recommended. 2. Mild ductal dilatation of the main pancreatic duct throughout the distal body and tail of the pancreas where there is also some associated side branch ectasia. This may suggest a pancreatic ductal stricture. No obstructing pancreatic neoplasm identified. 3. Aortic atherosclerosis.   01/21/2019 Initial Biopsy   Diagnosis 01/21/19  BILE DUCT BRUSHING (SPECIMEN 1 OF 1 COLLECTED 01/21/2019) ADENOCARCINOMA.   01/21/2019 Procedure   ERCP By Dr hung 01/21/19 IMRPESSION - The major papilla appeared normal. - A single localized biliary stricture was found in the middle third of the main bile duct. - The entire main bile duct and upper third of the main bile duct were dilated, secondary to a stricture. - A biliary sphincterotomy was performed. - Cells for cytology obtained in the middle third of the main bile duct. - One plastic stent was placed into the common bile duct.  EUS by Dr hung 01/21/19  IMPRESSION - There was dilation in the middle third of the main bile duct and in the upper third of the main bile duct which measured up to 15 mm. - There was a suggestion of a stricture in the middle third of the main bile duct. - No specimens collected.   02/03/2019 Imaging   CT CAP at [facility] 02/03/19  Impression:  1. There is mild intrahepatic and extrahepatic biliary ductal dilation and diffuse common bile duct wall thickening with stent in place. There is abnormal soft tissue measuring approximately 1.6 cm between the common hepatic artery, portal vein and common bile duct, worrisome for the known cholangiocarcinoma.  2. Periportal lymphadenopathy which  may represent nodal metastasis.  4. Marked pancreatic atrophy and duct dilation involving the distal body and tail the pancreas where there is a coarse  calcification. These findings are favored to represent stenosis from intraductal stones though an underlying stricture cannot be completely excluded.  5. Atypical symmetric prominent fat stranding of the bilateral lower abdominal wall. Correlate with surgical history or history of history of trauma.   03/08/2019 Initial Diagnosis   Cholangiocarcinoma (HCC)   03/12/2019 -  Chemotherapy   Gemcitabine and cisplatin on days 1 and 8 every 21 days starting 03/12/19. Abraxane added with cycle 2. Added GCSF ([name]) to day 9 starting cycle 2.  Abraxane stopped after C4 and chemo held 06/03/19-07/02/19 due to poor toleration/hospitalization. Cisplatin removed starting from cycle 13 due to renal insufficiency.    03/19/2019 Cancer Staging   Staging form: Perihilar Bile Ducts, AJCC 8th Edition - Clinical: Stage Unknown (cTX, cN0, cM0) - Signed by Malachy Mood, MD on 03/19/2019   05/24/2019 Imaging   CT CAP W Contrast at [facility]  1.  Interval placement of covered metallic biliary stent. No progressive dilation of the biliary tree and resulting resolution of the prior main pancreatic duct dilation. 2.  Evidence of some subtle vascular contour deformity involving the hepatic artery, suspected to be from tumoral contact and/or posttreatment changes. 3.  New small right pleural effusion with overlying mild airspace disease. Secondary findings which may indicate a component of aspiration. 4.  No convincing evidence of new disseminated metastatic disease. 5.  Additional findings as discussed above.   06/18/2019 Imaging   CT AP IMPRESSION: 1.  No acute findings in the abdomen/pelvis. 2. Biliary stent in adequate position. No evidence of focal mass or adenopathy in the region of the pancreatic head or porta hepatis. 3. Moderate size right pleural effusion with associated right basilar atelectasis. 4. Possible single gallstone. Stable subcentimeter liver cyst. Stable right renal cysts. Small left inguinal hernia  containing only peritoneal fat. 5. Aortic Atherosclerosis (ICD10-I70.0).   07/28/2019 Imaging   CT CAP IMPRESSION: 1. The patient is status post common bile duct stent placement, with unchanged stent position and patent appearance with pneumobilia. Primary cholangiocarcinoma is not discretely appreciated by CT. 2. No direct evidence of metastatic disease in the chest, abdomen, or pelvis. 3. Moderate right pleural effusion with associated atelectasis or consolidation, slightly improved compared to prior examination. 4. The pancreatic parenchyma is atrophic and calcified, particularly in the pancreatic tail. 5.  Coronary artery disease.  Aortic atherosclerosis   10/20/2019 PET scan   IMPRESSION: 1. Redemonstrated post treatment and post stenting appearance of a central cholangiocarcinoma, which is not directly appreciated by CT. Common bile duct stent remains in position with post stenting pneumobilia. 2. Slight interval increase in a moderate to large right pleural effusion with associated atelectasis or consolidation. There may be some pleural nodularity about the right hemidiaphragm which appears new compared to prior examination (series 2, image 40), highly suspicious for pleural metastatic disease. 3. No direct evidence of metastatic disease in the chest, abdomen, or pelvis. 4. Coronary artery disease.  Aortic Atherosclerosis (ICD10-I70.0).     10/29/2019 Pathology Results   Thoracentesis FINAL MICROSCOPIC DIAGNOSIS:  - Reactive mesothelial cells present    11/17/2019 PET scan   IMPRESSION: 1. Low-level hypermetabolism within the right pleural space, without focal abnormality to strongly suggest pleural metastasis. 2. Hypermetabolism along the course of the common duct stent, nonspecific and most likely reactive. 3. Otherwise, no evidence of hypermetabolic metastasis. 4. Small to  moderate right pleural effusion with developing loculation superiorly. 5. Coronary artery  atherosclerosis. Aortic Atherosclerosis (ICD10-I70.0). 6.  Possible constipation.     02/03/2020 Imaging   CT CAP IMPRESSION: 1. Stable indistinct circumferential biliary wall thickening in the proximal CBD, without discrete biliary mass. Stable well-positioned CBD stent with intrahepatic pneumobilia indicating patency. 2. No findings of metastatic disease in the abdomen or pelvis. 3. Small dependent right pleural effusion is decreased. Mild diffuse smooth right pleural thickening is similar to slightly increased, nonspecific, cannot exclude malignant pleural effusion. No discrete pleural nodularity. 4. Tiny clustered tree-in-bud type nodules in the anterior left upper lobe, slightly increased, nonspecific, more likely inflammatory. Recommend attention on follow-up chest CT in 3 months. 5. Chronic findings include: Chronic pancreatitis. Coronary atherosclerosis. Aortic Atherosclerosis (ICD10-I70.0).     02/21/2020 Imaging   US Abdomen  IMPRESSION: Mildly distended gallbladder without visible wall thickening or stones.   Common bile duct stent in place. Slight prominence of intrahepatic biliary ducts in the left hepatic lobe.   Increased echotexture of the liver suggesting fatty infiltration.   04/12/2020 Imaging   CT CAP W contrast  IMPRESSION: 1. Enlarging periportal lymph nodes and new duodenal wall thickening are worrisome for disease progression. An infectious or inflammatory process is not excluded. Soft tissue thickening surrounding the lower common bile duct with wall stent in place, stable. 2. Chronic-appearing moderate right pleural effusion, stable. 3. Stable pulmonary nodularity. Continued attention on follow-up is recommended. 4. Fair amount of stool in the colon is indicative of constipation. 5. Chronic pancreatitis. 6. Aortic atherosclerosis (ICD10-I70.0). Coronary artery calcification.      CURRENT THERAPY:  Gemcitabine and cisplatin on days 1 and 8  every 21 daysstarting 03/12/19. Abraxane added with cycle 2.AddedGCSF ([name])to day 9 startingcycle 2. Abraxane stopped after C4 and chemo held9/3/20-10/2/20 due to poor toleration/hospitalization.Cisplatin removed starting from cycle13due to renal insufficiency.  INTERVAL HISTORY:  Christopher Welch is here for a follow up. He notes stomach has improved since last week but still has discomfort after eating. He denies N&V. He notes his appetite is low but he has been making himself eat. For pain he is taking Oxycodone '5mg'$  once a day. He also notes his upper leg pain is controlled with this. He is able to get around the house. He makes himself get up and do things at home. He notes his last endoscopy in 12/2018 with Dr Benson Norway was almost $800 because he brought in more people that his insurance did not cover.    REVIEW OF SYSTEMS:   Constitutional: Denies fevers, chills or abnormal weight loss Eyes: Denies blurriness of vision Ears, nose, mouth, throat, and face: Denies mucositis or sore throat Respiratory: Denies cough, dyspnea or wheezes Cardiovascular: Denies palpitation, chest discomfort or lower extremity swelling Gastrointestinal:  Denies nausea, heartburn or change in bowel habits (+) Improved abdominal pain  Skin: Denies abnormal skin rashes Lymphatics: Denies new lymphadenopathy or easy bruising Neurological:Denies numbness, tingling or new weaknesses Behavioral/Psych: Mood is stable, no new changes  All other systems were reviewed with the patient and are negative.  MEDICAL HISTORY:  Past Medical History:  Diagnosis Date   Diabetes mellitus type I, controlled ([place])    Dyslipidemia    ED (erectile dysfunction)    Essential hypertension    Gilbert's disease    Hepatitis B carrier ([place])     SURGICAL HISTORY: Past Surgical History:  Procedure Laterality Date   APPENDECTOMY  1964   BILIARY BRUSHING  01/21/2019   Procedure: BILIARY  BRUSHING;  Surgeon: Carol Ada,  MD;  Location: [facility];  Service: Endoscopy;;   BILIARY STENT PLACEMENT N/A 01/21/2019   Procedure: BILIARY STENT PLACEMENT;  Surgeon: Carol Ada, MD;  Location: [facility];  Service: Endoscopy;  Laterality: N/A;   ENDOSCOPIC RETROGRADE CHOLANGIOPANCREATOGRAPHY (ERCP) WITH PROPOFOL N/A 01/21/2019   Procedure: ENDOSCOPIC RETROGRADE CHOLANGIOPANCREATOGRAPHY (ERCP) WITH PROPOFOL;  Surgeon: Carol Ada, MD;  Location: [facility];  Service: Endoscopy;  Laterality: N/A;   ESOPHAGOGASTRODUODENOSCOPY (EGD) WITH PROPOFOL N/A 01/21/2019   Procedure: ESOPHAGOGASTRODUODENOSCOPY (EGD) WITH PROPOFOL;  Surgeon: Carol Ada, MD;  Location: [facility];  Service: Endoscopy;  Laterality: N/A;   FEMORAL HERNIA REPAIR     IR CHOLANGIOGRAM EXISTING TUBE  05/13/2019   IR CHOLANGIOGRAM EXISTING TUBE  06/08/2019   IR EXCHANGE BILIARY DRAIN  05/18/2019   IR EXCHANGE BILIARY DRAIN  06/08/2019   IR IMAGING GUIDED PORT INSERTION  03/08/2019   IR PERC CHOLECYSTOSTOMY  05/04/2019   IR THORACENTESIS ASP PLEURAL SPACE W/IMG GUIDE  10/29/2019   LEFT HEART CATH AND CORONARY ANGIOGRAPHY N/A 05/19/2017   Procedure: LEFT HEART CATH AND CORONARY ANGIOGRAPHY;  Surgeon: Leonie Man, MD;  Location: [facility];  Service: Cardiovascular;  Laterality: N/A;   SPHINCTEROTOMY  01/21/2019   Procedure: SPHINCTEROTOMY;  Surgeon: Carol Ada, MD;  Location: [facility];  Service: Endoscopy;;   UPPER ESOPHAGEAL ENDOSCOPIC ULTRASOUND (EUS) N/A 01/21/2019   Procedure: UPPER ESOPHAGEAL ENDOSCOPIC ULTRASOUND (EUS);  Surgeon: Carol Ada, MD;  Location: [facility];  Service: Endoscopy;  Laterality: N/A;    I have reviewed the social history and family history with the patient and they are unchanged from previous note.  ALLERGIES:  is allergic to erythromycin.  MEDICATIONS:  Current Outpatient Medications  Medication Sig Dispense Refill   acetaminophen (TYLENOL) 500 MG tablet Take 1,000 mg by mouth 2 (two)  times daily as needed for moderate pain or headache.     amLODipine (NORVASC) 5 MG tablet Take 5 mg by mouth daily.      atenolol (TENORMIN) 25 MG tablet Take 25 mg by mouth daily. Once a day      atorvastatin (LIPITOR) 10 MG tablet Take 10 mg by mouth daily.     Blood Glucose Monitoring Suppl (ACCU-CHEK NANO SMARTVIEW) W/DEVICE KIT 1 kit by Does not apply route 2 (two) times daily. (Patient taking differently: 1 kit by Does not apply route See admin instructions. Test blood sugars 12x's daily) 1 kit 0   clonazePAM (KLONOPIN) 0.5 MG tablet      glucose blood test strip Test 3 times a day. (Patient taking differently: 1 each by Other route See admin instructions. Test 12 times a day.) 300 each Prn   insulin NPH Human (NOVOLIN N) 100 UNIT/ML injection Inject 20 Units into the skin at bedtime.      insulin regular (NOVOLIN R) 100 units/mL injection Inject 20 Units into the skin 3 (three) times daily before meals.      lidocaine-prilocaine (EMLA) cream Apply to affected area once 30 g 3   lisinopril (ZESTRIL) 20 MG tablet Take 20 mg by mouth daily.      LORazepam (ATIVAN) 0.5 MG tablet Take 0.5-1 tablets (0.25-0.5 mg total) by mouth every 8 (eight) hours as needed (nausea and vomiting). 30 tablet 0   magnesium oxide (MAG-OX) 400 MG tablet TAKE 1 TABLET BY MOUTH EVERY DAY 90 tablet 1   metFORMIN (GLUCOPHAGE) 1000 MG tablet Take 2,000 mg by mouth every evening.      mirtazapine (  REMERON) 7.5 MG tablet TAKE 1 TABLET (7.5 MG TOTAL) BY MOUTH AT BEDTIME. 90 tablet 1   ondansetron (ZOFRAN) 8 MG tablet TAKE 1 TABLET BY MOUTH 2 TIMES DAY AS NEEDED. START ON 3RD DAY AFTER CHEMOTHERAPY 30 tablet 1   oxycodone (OXY-IR) 5 MG capsule Take 1 capsule (5 mg total) by mouth every 6 (six) hours as needed for pain (severe pain). 40 capsule 0   pantoprazole (PROTONIX) 40 MG tablet Take 1 tablet (40 mg total) by mouth daily. 30 tablet 0   prochlorperazine (COMPAZINE) 10 MG tablet TAKE 1 TABLET (10 MG  TOTAL) BY MOUTH EVERY 6 (SIX) HOURS AS NEEDED (NAUSEA OR VOMITING). 30 tablet 1   zolpidem (AMBIEN) 5 MG tablet Take 1-2 tablets (5-10 mg total) by mouth at bedtime as needed for sleep. 60 tablet 0   No current facility-administered medications for this visit.   Facility-Administered Medications Ordered in Other Visits  Medication Dose Route Frequency Provider Last Rate Last Admin   0.9 %  sodium chloride infusion (Manually program via Guardrails IV Fluids)  250 mL Intravenous Once [name], MD        PHYSICAL EXAMINATION: ECOG PERFORMANCE STATUS: 2 - Symptomatic, <50% confined to bed  No vitals taken today, Exam not performed today   LABORATORY DATA:  I have reviewed the data as listed CBC Latest Ref Rng & Units 04/07/2020 03/23/2020 03/16/2020  WBC 4.0 - 10.5 K/uL 7.4 5.3 8.0  Hemoglobin 13.0 - 17.0 g/dL 9.2(L) 8.6(L) 8.9(L)  Hematocrit 39 - 52 % 28.5(L) 26.6(L) 26.7(L)  Platelets 150 - 400 K/uL 270 305 333     CMP Latest Ref Rng & Units 04/07/2020 03/23/2020 03/16/2020  Glucose 70 - 99 mg/dL 271(H) 166(H) 193(H)  BUN 8 - 23 mg/dL 27(H) 14 22  Creatinine 0.61 - 1.24 mg/dL 1.79(H) 1.41(H) 1.60(H)  Sodium 135 - 145 mmol/L 137 140 140  Potassium 3.5 - 5.1 mmol/L 4.5 4.4 4.3  Chloride 98 - 111 mmol/L 103 105 106  CO2 22 - 32 mmol/L _0 Calcium 8.9 - 10.3 mg/dL 9.7 9.4 9.3  Total Protein 6.5 - 8.1 g/dL 7.6 7.2 7.3  Total Bilirubin 0.3 - 1.2 mg/dL 0.6 0.5 0.5  Alkaline Phos 38 - 126 U/L 62 57 62  AST 15 - 41 U/L _1 ALT 0 - 44 U/L _2 RADIOGRAPHIC STUDIES: I have personally reviewed the radiological images as listed and agreed with the findings in the report. No results found.   ASSESSMENT & PLAN:  Christopher Welch is a 74 y.o. male with    1. Extrahepatic cholangiocarcinoma, cTxN0Mx -He wasdiagnosedin 12/2018. His work up revealed malignant stricture and CBD obstruction required stenting. Brushing revealed adenocarcinoma. His extrahepatic  cholangiocarcinoma was not resectable due to vascular invasion(surgeon - Dr. Zenia Resides at [place] started first line chemogemcitabine and cisplatin on days 1 and 8 every 21 dayson6/12/20. Abraxane added with cycle 2.AddedGCSF ([name])to day 9 startingcycle 2.Abraxane stopped after C4 and chemo held9/3/20-10/2/20 due to poor toleration/hospitalization. He has improved with dose reduction.Cisplatin held starting with C13due to decreased kidney function. He is on single agent Cisplatin for now.  -He met with surgeon Dr. Carlis Abbott on 2/22/21who does not recommend surgery at this time given his decreased performance statusand technically difficult resection. Will continue with same chemotherapy regimen to control his disease. -We discussed his CT CAP from 04/12/20 which showed Enlarging periportal lymph nodes and new duodenal wall thickening  are worrisome for disease progression or Inflammation. I discussed the option of endoscopy for further evaluation and possible tumor biopsy for molecular testing. I will discuss this with Dr Benson Norway.  -I discussed continuing chemo with change of regimen to FOLFOX due to the concern of cancer progression, or proceed with palliative radiation to reduce tumor burdenand control his diseaseso he can take a longer chemo break. I discussed if the duodenal thickening if from inflammation, RT can exacerbate this. He is interested in RT, will refer him to rad/onc Dr. Lisbeth Renshaw. We discussed case in GI conference last week.  -F/u in 2 weeks    2. abdominal pain  -Secondary to #1 he has had mild abdominal pain, and rarely needed oxycodone.  -In the past week he has had worsened mid abdominal pain that radiates to back and upper legs and leads to LE weakness. He has no signs of infection or Jaundice.  -I discussed etiology of this pain is unclear. I encouraged him to increase Doculax as constipation can be related and pain medication use can exacerbate his constipation. He is  agreeable.  -I will also call in Protonix for possible irritation.  -His pain did flare last week but has improved this week. Based on duodenal thickening on 04/12/20 CT scan, this could be related to disease progression. Pain currently controlled on Oxycodone 59m once daily.    3.History of acute cholecystitis -He washospitalizedon 05/03/19.  -He had a percutaneous gallbladder drainage tube placed by IR8/5/20.It was removed on 9/8 due to leakage  -Hiscurrent abdominal pain is probably related to the procedure, I encouraged him to use pain medication as needed -No clinical high concern for acute cholecystitis we will continue to monitoring clinically   3.Right-sided pleural effusion -He underwent right thoracentesis with 2.1 is removedon 06/23/2019, cytologynegative.The etiology is unclear, certainly malignancy is ahigh possibility. Hewas treated withcourse of antibiotics -His SOB had progressed and he has worsened right pleural effusion on 10/20/19 CT scan. -He had a Thoracentesis on 10/29/19 with 1.3L removed. Cytology showed reactive mesothelial cells, no malignancy.PET from 11/17/19 is not definitve for metastasis but still suspected, will monitor. -His breathing and SOB is stable.   4. Weight lossand low appetite -secondary to malignancyand chemo;he presented with 20 lbs weight loss  -Continue to f/u with dieticianand supplements -His appetitehas dropped significantly when he hadcholecystitis.  -Recently started Mirtazapine which is helping -His weight is fluctuating lately, but overall trending down. He has little to no appetite but makes sure he eats small 3 meals and Protein shakes. He will drink moderately with each meal.   5. CAD, HL, HTN, DM -on amlodipine, atenolol, lisinopril, and statin. Continue tofollow up withcardiologist Dr. HSherryle LisPCP Dr CTheda Sers -Heart functioncontrolled and stable. DM not well controlled.   6.CKD stage  III -his cr has been fluctuating lately -Cisplatind/c after C13   PLAN: -will discuss with Dr HBenson Norwayabout repeating Endoscopy  -rad/onc referral  -Lab and F/u in 2 weeks  -will hold chemo for now    No problem-specific Assessment & Plan notes found for this encounter.   No orders of the defined types were placed in this encounter.  I discussed the assessment and treatment plan with the patient. The patient was provided an opportunity to ask questions and all were answered. The patient agreed with the plan and demonstrated an understanding of the instructions.  The patient was advised to call back or seek an in-person evaluation if the symptoms worsen or if the condition fails to  improve as anticipated.  The total time spent in the appointment was 25 minutes.    [name], MD 04/14/2020   I, Joslyn Devon, am acting as scribe for [name], MD.   I have reviewed the above documentation for accuracy and completeness, and I agree with the above.

## 2020-04-14 ENCOUNTER — Encounter: Payer: Self-pay | Admitting: Hematology

## 2020-04-14 ENCOUNTER — Inpatient Hospital Stay (HOSPITAL_BASED_OUTPATIENT_CLINIC_OR_DEPARTMENT_OTHER): Payer: Medicare Other | Admitting: Hematology

## 2020-04-14 DIAGNOSIS — C221 Intrahepatic bile duct carcinoma: Secondary | ICD-10-CM | POA: Diagnosis not present

## 2020-04-14 MED ORDER — OXYCODONE HCL 5 MG PO CAPS: 5.0000 mg | ORAL_CAPSULE | Freq: Four times a day (QID) | ORAL | 0 refills | Status: DC | PRN

## 2020-04-17 ENCOUNTER — Telehealth: Payer: Self-pay | Admitting: Hematology

## 2020-04-17 NOTE — Telephone Encounter (Signed)
Scheduled per 7/16 los. Pt is aware of appt time and date.

## 2020-04-24 NOTE — Progress Notes (Signed)
[place]   Telephone:([phone] Fax:([phone]) [phone]   Clinic Follow up Note   Patient Care Team: [name], DO as PCP - General (Family Medicine)  Date of Service:  04/28/2020  CHIEF COMPLAINT: F/u of ExtrahepaticCholangiocarcinoma  SUMMARY OF ONCOLOGIC HISTORY: Oncology History Overview Note  Cancer Staging [facility]) Staging form: Perihilar Bile Ducts, AJCC 8th Edition - Clinical: Stage Unknown (cTX, cN0, cM0) - Signed by [name], MD on 03/19/2019    Cholangiocarcinoma ([place])  01/14/2019 Imaging   US Abdomen 01/14/19  IMPRESSION: There is significant intrahepatic and extrahepatic biliary dilatation present, concerning for distal common bile duct obstruction. Further evaluation with CT scan or MRCP is recommended. Correlation with liver function tests is recommended as well. These results will be called to the ordering clinician or representative by the Radiologist Assistant, and communication documented in the PACS or zVision Dashboard.   Probable large amount of sludge seen within gallbladder lumen with mild gallbladder wall thickening. However, no cholelithiasis, pericholecystic fluid or sonographic Murphy's sign is noted.   4.2 cm septated cyst seen in upper pole of right kidney consistent with Bosniak type 2 lesion. Follow-up ultrasound in 1 year is recommended to ensure stability.   01/20/2019 Imaging   MRI abdomen 01/20/19  IMPRESSION: 1. Findings are highly concerning for central tumor in the biliary tract at the confluence of the common hepatic duct, cystic duct and common bile ducts. Further clinical evaluation for potential cholangiocarcinoma is strongly recommended. 2. Mild ductal dilatation of the main pancreatic duct throughout the distal body and tail of the pancreas where there is also some associated side branch ectasia. This may suggest a pancreatic ductal stricture. No obstructing pancreatic neoplasm  identified. 3. Aortic atherosclerosis.   01/21/2019 Initial Biopsy   Diagnosis 01/21/19  BILE DUCT BRUSHING (SPECIMEN 1 OF 1 COLLECTED 01/21/2019) ADENOCARCINOMA.   01/21/2019 Procedure   ERCP By Dr hung 01/21/19 IMRPESSION - The major papilla appeared normal. - A single localized biliary stricture was found in the middle third of the main bile duct. - The entire main bile duct and upper third of the main bile duct were dilated, secondary to a stricture. - A biliary sphincterotomy was performed. - Cells for cytology obtained in the middle third of the main bile duct. - One plastic stent was placed into the common bile duct.  EUS by Dr hung 01/21/19  IMPRESSION - There was dilation in the middle third of the main bile duct and in the upper third of the main bile duct which measured up to 15 mm. - There was a suggestion of a stricture in the middle third of the main bile duct. - No specimens collected.   02/03/2019 Imaging   CT CAP at [facility] 02/03/19  Impression:  1. There is mild intrahepatic and extrahepatic biliary ductal dilation and diffuse common bile duct wall thickening with stent in place. There is abnormal soft tissue measuring approximately 1.6 cm between the common hepatic artery, portal vein and common bile duct, worrisome for the known cholangiocarcinoma.  2. Periportal lymphadenopathy which may represent nodal metastasis.  4. Marked pancreatic atrophy and duct dilation involving the distal body and tail the pancreas where there is a coarse calcification. These findings are favored to represent stenosis from intraductal stones though an underlying stricture cannot be completely excluded.  5. Atypical symmetric prominent fat stranding of the bilateral lower abdominal wall. Correlate with surgical history or history of history of trauma.   03/08/2019 Initial Diagnosis  Cholangiocarcinoma ([place])   03/12/2019 - 04/14/2020 Chemotherapy   Gemcitabine and cisplatin on days 1  and 8 every 21 days starting 03/12/19. Abraxane added with cycle 2. Added GCSF ([name]) to day 9 starting cycle 2.  Abraxane stopped after C4 and chemo held 06/03/19-07/02/19 due to poor toleration/hospitalization. Cisplatin removed starting from cycle 13 due to renal insufficiency. Single agent Gemcitabine held since 04/14/20 to proceed with Target Radiation and chemo break, due to possible disease progression.    03/19/2019 Cancer Staging   Staging form: Perihilar Bile Ducts, AJCC 8th Edition - Clinical: Stage Unknown (cTX, cN0, cM0) - Signed by [name], MD on 03/19/2019   05/24/2019 Imaging   CT CAP W Contrast at [facility]  1.  Interval placement of covered metallic biliary stent. No progressive dilation of the biliary tree and resulting resolution of the prior main pancreatic duct dilation. 2.  Evidence of some subtle vascular contour deformity involving the hepatic artery, suspected to be from tumoral contact and/or posttreatment changes. 3.  New small right pleural effusion with overlying mild airspace disease. Secondary findings which may indicate a component of aspiration. 4.  No convincing evidence of new disseminated metastatic disease. 5.  Additional findings as discussed above.   06/18/2019 Imaging   CT AP IMPRESSION: 1.  No acute findings in the abdomen/pelvis. 2. Biliary stent in adequate position. No evidence of focal mass or adenopathy in the region of the pancreatic head or porta hepatis. 3. Moderate size right pleural effusion with associated right basilar atelectasis. 4. Possible single gallstone. Stable subcentimeter liver cyst. Stable right renal cysts. Small left inguinal hernia containing only peritoneal fat. 5. Aortic Atherosclerosis (ICD10-I70.0).   07/28/2019 Imaging   CT CAP IMPRESSION: 1. The patient is status post common bile duct stent placement, with unchanged stent position and patent appearance with pneumobilia. Primary cholangiocarcinoma is not discretely  appreciated by CT. 2. No direct evidence of metastatic disease in the chest, abdomen, or pelvis. 3. Moderate right pleural effusion with associated atelectasis or consolidation, slightly improved compared to prior examination. 4. The pancreatic parenchyma is atrophic and calcified, particularly in the pancreatic tail. 5.  Coronary artery disease.  Aortic atherosclerosis   10/20/2019 PET scan   IMPRESSION: 1. Redemonstrated post treatment and post stenting appearance of a central cholangiocarcinoma, which is not directly appreciated by CT. Common bile duct stent remains in position with post stenting pneumobilia. 2. Slight interval increase in a moderate to large right pleural effusion with associated atelectasis or consolidation. There may be some pleural nodularity about the right hemidiaphragm which appears new compared to prior examination (series 2, image 40), highly suspicious for pleural metastatic disease. 3. No direct evidence of metastatic disease in the chest, abdomen, or pelvis. 4. Coronary artery disease.  Aortic Atherosclerosis (ICD10-I70.0).     10/29/2019 Pathology Results   Thoracentesis FINAL MICROSCOPIC DIAGNOSIS:  - Reactive mesothelial cells present    11/17/2019 PET scan   IMPRESSION: 1. Low-level hypermetabolism within the right pleural space, without focal abnormality to strongly suggest pleural metastasis. 2. Hypermetabolism along the course of the common duct stent, nonspecific and most likely reactive. 3. Otherwise, no evidence of hypermetabolic metastasis. 4. Small to moderate right pleural effusion with developing loculation superiorly. 5. Coronary artery atherosclerosis. Aortic Atherosclerosis (ICD10-I70.0). 6.  Possible constipation.     02/03/2020 Imaging   CT CAP IMPRESSION: 1. Stable indistinct circumferential biliary wall thickening in the proximal CBD, without discrete biliary mass. Stable well-positioned CBD stent with intrahepatic  pneumobilia indicating patency.  2. No findings of metastatic disease in the abdomen or pelvis. 3. Small dependent right pleural effusion is decreased. Mild diffuse smooth right pleural thickening is similar to slightly increased, nonspecific, cannot exclude malignant pleural effusion. No discrete pleural nodularity. 4. Tiny clustered tree-in-bud type nodules in the anterior left upper lobe, slightly increased, nonspecific, more likely inflammatory. Recommend attention on follow-up chest CT in 3 months. 5. Chronic findings include: Chronic pancreatitis. Coronary atherosclerosis. Aortic Atherosclerosis (ICD10-I70.0).     02/21/2020 Imaging   US Abdomen  IMPRESSION: Mildly distended gallbladder without visible wall thickening or stones.   Common bile duct stent in place. Slight prominence of intrahepatic biliary ducts in the left hepatic lobe.   Increased echotexture of the liver suggesting fatty infiltration.   04/12/2020 Imaging   CT CAP W contrast  IMPRESSION: 1. Enlarging periportal lymph nodes and new duodenal wall thickening are worrisome for disease progression. An infectious or inflammatory process is not excluded. Soft tissue thickening surrounding the lower common bile duct with wall stent in place, stable. 2. Chronic-appearing moderate right pleural effusion, stable. 3. Stable pulmonary nodularity. Continued attention on follow-up is recommended. 4. Fair amount of stool in the colon is indicative of constipation. 5. Chronic pancreatitis. 6. Aortic atherosclerosis (ICD10-I70.0). Coronary artery calcification.      CURRENT THERAPY:  PENDING consolidating Radiation with Dr Lisbeth Renshaw   INTERVAL HISTORY:  Christopher Welch is here for a follow up and treatment. He presents to the clinic alone. He notes he still has mid lower abdominal pain with lower back and leg aches daily. He has increased Oxycodone. He notes 1 day he mistakenly took 2 doses too 2 hours apart and was very  nauseous with vomiting the next day. He notes takes Oxycodone BID. He notes he has no appetite, but makes sure he eats. He denies fever or chills. He notes he has regular BMs. He takes Ducolax and has Miralax and Linzess.   He notes he does not want to do endoscopy. He has not talked to Rad Onc yet. He notes he is very d depressed lately as his support system is minimal. He has 1-2 family members who live out of state and do not reach them often. 93 of his friends and any church people who he knows have passed. He notes he has a Chief Strategy Officer at home.     REVIEW OF SYSTEMS:   Constitutional: Denies fevers, chills or abnormal weight loss Eyes: Denies blurriness of vision Ears, nose, mouth, throat, and face: Denies mucositis or sore throat Respiratory: Denies cough, dyspnea or wheezes Cardiovascular: Denies palpitation, chest discomfort or lower extremity swelling Gastrointestinal:  Denies heartburn or change in bowel habits (+) Abdominal pain (+) Nausea Skin: Denies abnormal skin rashes MSK: (+) lower Back and leg aches  Lymphatics: Denies new lymphadenopathy or easy bruising Neurological:Denies numbness, tingling or new weaknesses Behavioral/Psych: (+) Depressed All other systems were reviewed with the patient and are negative.  MEDICAL HISTORY:  Past Medical History:  Diagnosis Date  . Diabetes mellitus type I, controlled ([place])   . Dyslipidemia   . ED (erectile dysfunction)   . Essential hypertension   . Gilbert's disease   . Hepatitis B carrier [facility])     SURGICAL HISTORY: Past Surgical History:  Procedure Laterality Date  . APPENDECTOMY  1964  . BILIARY BRUSHING  01/21/2019   Procedure: BILIARY BRUSHING;  Surgeon: Carol Ada, MD;  Location: [facility];  Service: Endoscopy;;  . BILIARY STENT PLACEMENT N/A 01/21/2019   Procedure:  BILIARY STENT PLACEMENT;  Surgeon: Carol Ada, MD;  Location: [facility];  Service: Endoscopy;  Laterality: N/A;  . ENDOSCOPIC RETROGRADE  CHOLANGIOPANCREATOGRAPHY (ERCP) WITH PROPOFOL N/A 01/21/2019   Procedure: ENDOSCOPIC RETROGRADE CHOLANGIOPANCREATOGRAPHY (ERCP) WITH PROPOFOL;  Surgeon: Carol Ada, MD;  Location: [facility];  Service: Endoscopy;  Laterality: N/A;  . ESOPHAGOGASTRODUODENOSCOPY (EGD) WITH PROPOFOL N/A 01/21/2019   Procedure: ESOPHAGOGASTRODUODENOSCOPY (EGD) WITH PROPOFOL;  Surgeon: Carol Ada, MD;  Location: [facility];  Service: Endoscopy;  Laterality: N/A;  . FEMORAL HERNIA REPAIR    . IR CHOLANGIOGRAM EXISTING TUBE  05/13/2019  . IR CHOLANGIOGRAM EXISTING TUBE  06/08/2019  . IR EXCHANGE BILIARY DRAIN  05/18/2019  . IR EXCHANGE BILIARY DRAIN  06/08/2019  . IR IMAGING GUIDED PORT INSERTION  03/08/2019  . IR PERC CHOLECYSTOSTOMY  05/04/2019  . IR THORACENTESIS ASP PLEURAL SPACE W/IMG GUIDE  10/29/2019  . LEFT HEART CATH AND CORONARY ANGIOGRAPHY N/A 05/19/2017   Procedure: LEFT HEART CATH AND CORONARY ANGIOGRAPHY;  Surgeon: Leonie Man, MD;  Location: [facility];  Service: Cardiovascular;  Laterality: N/A;  . SPHINCTEROTOMY  01/21/2019   Procedure: SPHINCTEROTOMY;  Surgeon: Carol Ada, MD;  Location: [facility];  Service: Endoscopy;;  . UPPER ESOPHAGEAL ENDOSCOPIC ULTRASOUND (EUS) N/A 01/21/2019   Procedure: UPPER ESOPHAGEAL ENDOSCOPIC ULTRASOUND (EUS);  Surgeon: Carol Ada, MD;  Location: [facility];  Service: Endoscopy;  Laterality: N/A;    I have reviewed the social history and family history with the patient and they are unchanged from previous note.  ALLERGIES:  is allergic to erythromycin.  MEDICATIONS:  Current Outpatient Medications  Medication Sig Dispense Refill  . amLODipine (NORVASC) 5 MG tablet Take 5 mg by mouth daily.     Marland Kitchen atenolol (TENORMIN) 25 MG tablet Take 25 mg by mouth daily. Once a day     . atorvastatin (LIPITOR) 10 MG tablet Take 10 mg by mouth daily.    . Blood Glucose Monitoring Suppl (ACCU-CHEK NANO SMARTVIEW) W/DEVICE KIT 1 kit by Does not apply route 2 (two)  times daily. (Patient taking differently: 1 kit by Does not apply route See admin instructions. Test blood sugars 12x's daily) 1 kit 0  . clonazePAM (KLONOPIN) 0.5 MG tablet     . glucose blood test strip Test 3 times a day. (Patient taking differently: 1 each by Other route See admin instructions. Test 12 times a day.) 300 each Prn  . insulin NPH Human (NOVOLIN N) 100 UNIT/ML injection Inject 20 Units into the skin at bedtime.     . insulin regular (NOVOLIN R) 100 units/mL injection Inject 20 Units into the skin 3 (three) times daily before meals.     . lidocaine-prilocaine (EMLA) cream Apply to affected area once 30 g 3  . lisinopril (ZESTRIL) 20 MG tablet Take 20 mg by mouth daily.     . magnesium oxide (MAG-OX) 400 MG tablet TAKE 1 TABLET BY MOUTH EVERY DAY 90 tablet 1  . mirtazapine (REMERON) 7.5 MG tablet TAKE 1 TABLET (7.5 MG TOTAL) BY MOUTH AT BEDTIME. 90 tablet 1  . ondansetron (ZOFRAN) 8 MG tablet TAKE 1 TABLET BY MOUTH 2 TIMES DAY AS NEEDED. START ON 3RD DAY AFTER CHEMOTHERAPY 30 tablet 1  . oxycodone (OXY-IR) 5 MG capsule Take 1 capsule (5 mg total) by mouth every 6 (six) hours as needed for pain (severe pain). 60 capsule 0  . pantoprazole (PROTONIX) 40 MG tablet Take 1 tablet (40 mg total) by mouth daily. 30 tablet 0  .  prochlorperazine (COMPAZINE) 10 MG tablet TAKE 1 TABLET (10 MG TOTAL) BY MOUTH EVERY 6 (SIX) HOURS AS NEEDED (NAUSEA OR VOMITING). 30 tablet 1  . zolpidem (AMBIEN) 5 MG tablet Take 1-2 tablets (5-10 mg total) by mouth at bedtime as needed for sleep. 60 tablet 0  . acetaminophen (TYLENOL) 500 MG tablet Take 1,000 mg by mouth 2 (two) times daily as needed for moderate pain or headache.    . linaclotide (LINZESS) 145 MCG CAPS capsule Take 1 capsule (145 mcg total) by mouth daily before breakfast. 30 capsule 2  . LORazepam (ATIVAN) 0.5 MG tablet Take 0.5-1 tablets (0.25-0.5 mg total) by mouth every 8 (eight) hours as needed (nausea and vomiting). 30 tablet 0  . metFORMIN  (GLUCOPHAGE) 1000 MG tablet Take 2,000 mg by mouth every evening.      No current facility-administered medications for this visit.   Facility-Administered Medications Ordered in Other Visits  Medication Dose Route Frequency Provider Last Rate Last Admin  . 0.9 %  sodium chloride infusion (Manually program via Guardrails IV Fluids)  250 mL Intravenous Once [name], MD        PHYSICAL EXAMINATION: ECOG PERFORMANCE STATUS: 2 - Symptomatic, <50% confined to bed  Vitals:   04/28/20 0922  BP: 113/67  Pulse: 82  Resp: 18  Temp: 97.8 F (36.6 C)  SpO2: 98%   Filed Weights   04/28/20 0922  Weight: 146 lb 9.6 oz (66.5 kg)    GENERAL:alert, no distress and comfortable SKIN: skin color, texture, turgor are normal, no rashes or significant lesions EYES: normal, Conjunctiva are pink and non-injected, sclera clear  NECK: supple, thyroid normal size, non-tender, without nodularity LYMPH:  no palpable lymphadenopathy in the cervical, axillary  LUNGS: clear to auscultation and percussion with normal breathing effort (+) Mild pleural effusion  HEART: regular rate & rhythm and no murmurs and no lower extremity edema ABDOMEN:abdomen soft, non-tender and normal bowel sounds Musculoskeletal:no cyanosis of digits and no clubbing  NEURO: alert & oriented x 3 with fluent speech, no focal motor/sensory deficits  LABORATORY DATA:  I have reviewed the data as listed CBC Latest Ref Rng & Units 04/28/2020 04/07/2020 03/23/2020  WBC 4.0 - 10.5 K/uL 8.5 7.4 5.3  Hemoglobin 13.0 - 17.0 g/dL 9.9(L) 9.2(L) 8.6(L)  Hematocrit 39 - 52 % 29.6(L) 28.5(L) 26.6(L)  Platelets 150 - 400 K/uL 204 270 305     CMP Latest Ref Rng & Units 04/28/2020 04/07/2020 03/23/2020  Glucose 70 - 99 mg/dL 147(H) 271(H) 166(H)  BUN 8 - 23 mg/dL 25(H) 27(H) 14  Creatinine 0.61 - 1.24 mg/dL 1.55(H) 1.79(H) 1.41(H)  Sodium 135 - 145 mmol/L 138 137 140  Potassium 3.5 - 5.1 mmol/L 4.0 4.5 4.4  Chloride 98 - 111 mmol/L 105 103 105   CO2 22 - 32 mmol/L '23 24 26  '$ Calcium 8.9 - 10.3 mg/dL 10.4(H) 9.7 9.4  Total Protein 6.5 - 8.1 g/dL 7.7 7.6 7.2  Total Bilirubin 0.3 - 1.2 mg/dL 0.5 0.6 0.5  Alkaline Phos 38 - 126 U/L 113 62 57  AST 15 - 41 U/L '25 21 28  '$ ALT 0 - 44 U/L 36 17 21      RADIOGRAPHIC STUDIES: I have personally reviewed the radiological images as listed and agreed with the findings in the report. No results found.   ASSESSMENT & PLAN:  Christopher Welch is a 74 y.o. male with    1. Extrahepatic cholangiocarcinoma, cTxN0Mx -He wasdiagnosedin 12/2018. His work up revealed malignant  stricture and CBD obstruction required stenting. Brushing revealed adenocarcinoma. His extrahepatic cholangiocarcinoma was not resectable due to vascular invasion(surgeon - Dr. Zenia Resides at [facility] started first line chemogemcitabine and cisplatin on days 1 and 8 every 21 dayson6/12/20. Abraxane added with cycle 2.AddedGCSF ([name])to day 9 startingcycle 2.Abraxane stopped after C4 and chemo held9/3/20-10/2/20 due to poor toleration/hospitalization. He has improved with dose reduction.Cisplatin held starting with C13due to decreased kidney function. Single agent Gemcitabine held since 04/14/20 to proceed with chemo break.  -He met with surgeon Dr. Carlis Abbott on 2/22/21who does not recommend surgery at this time given his decreased performance statusand technically difficult resection. Will continue with treatment.  -His CT CAP from 04/12/20 showed Enlarging periportal lymph nodes and new duodenal wall thickening are worrisome for disease progression or Inflammation.  -I discussed the option of endoscopy for further evaluation and possible tumor biopsy for molecular testing with Dr Benson Norway. He declined.  -I again discussed continuing chemo with change of regimen to FOLFOX or proceed with palliative radiation to reduce tumor burdenand control his diseaseso he can take a longer chemo break. He is interested in RT. I have referred  him to rad/onc Dr. Lisbeth Renshaw but he has not been reached by him. I will check on this.  -Patient lives alone, very lonely, states most of his friends have passed away, and his only relative is his sister who lives out of state and not very close to him.  He does not feel it's important for him to live longer.  We discussed the quality of life, which is much more important for him now.  In this situation, all unlikely to restart chemotherapy due to his goa of care.  -I discussed the options of Palliative home care and Hospice home care and the benefits of both. He declined hospice and will think about palliative care. He notes he wants to continue coming to clinic because he gets to see people.  -f/u in 3 weeks.   2. Abdominal pain  -Secondary to #1 he has had mild abdominal pain, and rarely needed oxycodone.  -Pain worsened recently with mid abdominal pain that radiates to back and upper legs and leads to LE weakness. He has no signs of infection or Jaundice.  -I discussed etiology of this pain is unclear.  -He can increase Doculax as this can be related to constipation and he can take Protonix for possible irritation. Based on duodenal thickening on 04/12/20 CT scan, this could be related to disease progression. -His abdominal pain has increased recently. He also notes lower back and leg aches daily. He has increased oxycodone to '5mg'$  BID.  -I discussed his pain medication can lead to constipation. He has Ducolax, Miralax and a sample of Linzess. I refilled Linzess today (04/28/20)  3.History of acute cholecystitis -He washospitalizedon 05/03/19.  -He had a percutaneous gallbladder drainage tube placed by IR8/5/20.It was removed on 9/8 due to leakage  -No clinical high concern for acute cholecystitis we will continue to monitoring clinically  3.Right-sided pleural effusion -He underwent right thoracentesis with 2.1 is removedon 06/23/2019, cytologynegative.The etiology is unclear, certainly  malignancy is ahigh possibility. Hewas treated withcourse of antibiotics -His SOB hadprogressed and he has worsened right pleural effusion on 10/20/19 CT scan. -He had a Thoracentesis on 10/29/19 with 1.3L removed. Cytology showed reactive mesothelial cells, no malignancy.PET from 11/17/19 is not definitve for metastasis but still suspected, will monitor. -His breathingand SOBis stable.   4. Weight lossand low appetite -secondary to malignancyand chemo;he presented with  20 lbs weight loss  -Continue to f/u with dietician -His appetitehas dropped significantly when he hadcholecystitis.  -He has little to no appetite but makes sure he eats small 3 meals and Protein shakes. He will drink moderately with each meal. -His weight is stable lately. If 7.'5mg'$  Mirtazapine is not enough, I can increase dose for him  5. CAD, HL, HTN, DM -on amlodipine, atenolol, lisinopril, and statin. Continue tofollow up withcardiologist Dr. Sherryle Lis PCP Dr Theda Sers. -Heart functioncontrolled and stable. DM not well controlled.  6.CKD stage III -his cr has been fluctuating lately -Cisplatind/c after C13 -I encouraged him to drink more water.   7. Depression  -He notes he has minimal social support. He notes he has been feeling lonely and depressed lately being home alone.  -He has few remaining family members, friends, church members that are still living.  -He notes he is not active enough to travel to park.  -I discussed him speaking with our SW for counseling. He declined. -He notes he would like to still come to out clinic where there are people to talk to.   8. Goal of care discussion, DNR/DNI -We had a long conversation about, which is palliative patient is tolerating agreement. -I recommend DNR/DNI. He agreed -He notes he has Living will and POA set up. I recommend he bring [place] copy of his paperwork.  -I discussed with him proceeding with Palliative Care at home given he is  off chemo. He will think about it. He wants to continue to come to clinic.  -I do not plan to give him more chemo   PLAN: -I refilled Linzess and Oxycodone today  -I will check on Rad Onc Referral, messaged to Dr. Lisbeth Renshaw and Bryson Ha  -SW and chaplain referral for social support  -Lab, flush and F/u in 3 weeks    No problem-specific Assessment & Plan notes found for this encounter.   No orders of the defined types were placed in this encounter.  All questions were answered. The patient knows to call the clinic with any problems, questions or concerns. No barriers to learning was detected. The total time spent in the appointment was 30 minutes.     [name], MD 04/28/2020   I, Joslyn Devon, am acting as scribe for [name], MD.   I have reviewed the above documentation for accuracy and completeness, and I agree with the above.

## 2020-04-28 ENCOUNTER — Inpatient Hospital Stay: Payer: Medicare Other

## 2020-04-28 ENCOUNTER — Telehealth: Payer: Self-pay | Admitting: *Deleted

## 2020-04-28 ENCOUNTER — Other Ambulatory Visit: Payer: Self-pay

## 2020-04-28 ENCOUNTER — Inpatient Hospital Stay (HOSPITAL_BASED_OUTPATIENT_CLINIC_OR_DEPARTMENT_OTHER): Payer: Medicare Other | Admitting: Hematology

## 2020-04-28 ENCOUNTER — Encounter: Payer: Self-pay | Admitting: Hematology

## 2020-04-28 ENCOUNTER — Other Ambulatory Visit: Payer: Self-pay | Admitting: Hematology

## 2020-04-28 VITALS — BP 113/67 | HR 82 | Temp 97.8°F | Resp 18 | Ht 67.0 in | Wt 146.6 lb

## 2020-04-28 DIAGNOSIS — K5900 Constipation, unspecified: Secondary | ICD-10-CM | POA: Diagnosis not present

## 2020-04-28 DIAGNOSIS — C240 Malignant neoplasm of extrahepatic bile duct: Secondary | ICD-10-CM | POA: Diagnosis not present

## 2020-04-28 DIAGNOSIS — J90 Pleural effusion, not elsewhere classified: Secondary | ICD-10-CM | POA: Diagnosis not present

## 2020-04-28 DIAGNOSIS — R109 Unspecified abdominal pain: Secondary | ICD-10-CM | POA: Diagnosis not present

## 2020-04-28 DIAGNOSIS — C221 Intrahepatic bile duct carcinoma: Secondary | ICD-10-CM

## 2020-04-28 DIAGNOSIS — R531 Weakness: Secondary | ICD-10-CM | POA: Diagnosis not present

## 2020-04-28 DIAGNOSIS — Z95828 Presence of other vascular implants and grafts: Secondary | ICD-10-CM

## 2020-04-28 DIAGNOSIS — R61 Generalized hyperhidrosis: Secondary | ICD-10-CM | POA: Diagnosis not present

## 2020-04-28 LAB — CBC WITH DIFFERENTIAL (CANCER CENTER ONLY)
Abs Immature Granulocytes: 0.02 10*3/uL (ref 0.00–0.07)
Basophils Absolute: 0 10*3/uL (ref 0.0–0.1)
Basophils Relative: 1 %
Eosinophils Absolute: 0.3 10*3/uL (ref 0.0–0.5)
Eosinophils Relative: 4 %
HCT: 29.6 % — ABNORMAL LOW (ref 39.0–52.0)
Hemoglobin: 9.9 g/dL — ABNORMAL LOW (ref 13.0–17.0)
Immature Granulocytes: 0 %
Lymphocytes Relative: 28 %
Lymphs Abs: 2.3 10*3/uL (ref 0.7–4.0)
MCH: 30.4 pg (ref 26.0–34.0)
MCHC: 33.4 g/dL (ref 30.0–36.0)
MCV: 90.8 fL (ref 80.0–100.0)
Monocytes Absolute: 1 10*3/uL (ref 0.1–1.0)
Monocytes Relative: 12 %
Neutro Abs: 4.7 10*3/uL (ref 1.7–7.7)
Neutrophils Relative %: 55 %
Platelet Count: 204 10*3/uL (ref 150–400)
RBC: 3.26 MIL/uL — ABNORMAL LOW (ref 4.22–5.81)
RDW: 14.6 % (ref 11.5–15.5)
WBC Count: 8.5 10*3/uL (ref 4.0–10.5)
nRBC: 0 % (ref 0.0–0.2)

## 2020-04-28 LAB — CMP (CANCER CENTER ONLY)
ALT: 36 U/L (ref 0–44)
AST: 25 U/L (ref 15–41)
Albumin: 3.6 g/dL (ref 3.5–5.0)
Alkaline Phosphatase: 113 U/L (ref 38–126)
Anion gap: 10 (ref 5–15)
BUN: 25 mg/dL — ABNORMAL HIGH (ref 8–23)
CO2: 23 mmol/L (ref 22–32)
Calcium: 10.4 mg/dL — ABNORMAL HIGH (ref 8.9–10.3)
Chloride: 105 mmol/L (ref 98–111)
Creatinine: 1.55 mg/dL — ABNORMAL HIGH (ref 0.61–1.24)
GFR, Est AFR Am: 51 mL/min — ABNORMAL LOW (ref >60)
GFR, Estimated: 44 mL/min — ABNORMAL LOW (ref >60)
Glucose, Bld: 147 mg/dL — ABNORMAL HIGH (ref 70–99)
Potassium: 4 mmol/L (ref 3.5–5.1)
Sodium: 138 mmol/L (ref 135–145)
Total Bilirubin: 0.5 mg/dL (ref 0.3–1.2)
Total Protein: 7.7 g/dL (ref 6.5–8.1)

## 2020-04-28 MED ORDER — HEPARIN SOD (PORK) LOCK FLUSH 100 UNIT/ML IV SOLN
500.0000 [IU] | Freq: Once | INTRAVENOUS | Status: AC | PRN
  Administered 2020-04-28: 500 [IU]
  Filled 2020-04-28: qty 5

## 2020-04-28 MED ORDER — SODIUM CHLORIDE 0.9% FLUSH
10.0000 mL | INTRAVENOUS | Status: DC | PRN
  Administered 2020-04-28: 10 mL
  Filled 2020-04-28: qty 10

## 2020-04-28 MED ORDER — OXYCODONE HCL 5 MG PO CAPS: 5.0000 mg | ORAL_CAPSULE | Freq: Four times a day (QID) | ORAL | 0 refills | Status: DC | PRN

## 2020-04-28 MED ORDER — LINACLOTIDE 145 MCG PO CAPS: 145.0000 ug | ORAL_CAPSULE | Freq: Every day | ORAL | 2 refills | Status: DC

## 2020-04-28 NOTE — Telephone Encounter (Signed)
xxxx 

## 2020-04-29 LAB — CANCER ANTIGEN 19-9: CA 19-9: 369 U/mL — ABNORMAL HIGH (ref 0–35)

## 2020-04-30 ENCOUNTER — Other Ambulatory Visit: Payer: Self-pay | Admitting: Hematology

## 2020-05-03 ENCOUNTER — Encounter: Payer: Self-pay | Admitting: General Practice

## 2020-05-03 NOTE — Progress Notes (Signed)
Christopher Welch Spiritual Care Note  Spoke with [name] by phone for >30 minutes per referral from Dr Burr Medico for additional social, emotional, and spiritual support. [name] was very welcoming of pastoral call, sharing about sources of both grief and helpful perspective. 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 He works hard to identify times of higher energy and to maximize his enjoyment of what he is able to accomplish during those times (grocery runs, errands, yard work, Social research officer, government). Reading and TV supply additional routine and meaningful activity. We plan to follow up by phone next week to continue our pastoral conversation.   [place], [place], [facility] Pager [phone] Voicemail [phone]

## 2020-05-04 ENCOUNTER — Other Ambulatory Visit: Payer: Self-pay | Admitting: Hematology

## 2020-05-04 MED ORDER — OXYCODONE HCL 5 MG PO CAPS: 5.0000 mg | ORAL_CAPSULE | Freq: Four times a day (QID) | ORAL | 0 refills | Status: DC | PRN

## 2020-05-08 NOTE — Progress Notes (Signed)
GI Location of Tumor / Histology: Cholangiocarcinoma  Christopher Welch presented in April 2020 with epigastric pain and weight loss.  CT CAP 04/12/2020: Enlarging periportal lymph nodes and new duodenal wall thickening are worrisome for disease progression. An infectious or inflammatory process is not excluded. Soft tissue thickening surrounding the lower common bile duct with wall stent in place, stable.  Abdomen US 02/21/2020: Mildly distended gallbladder without visible wall thickening or stones.  Common bile duct stent in place. Slight prominence of intrahepatic biliary ducts in the left hepatic lobe.  Increased echotexture of the liver suggesting fatty infiltration.  CT CAP 02/03/2020: Stable indistinct circumferential biliary wall thickening in the proximal CBD, without discrete biliary mass. Stable well-positioned CBD stent with intrahepatic pneumobilia indicating patency. No findings of metastatic disease in the abdomen or pelvis. Small dependent right pleural effusion is decreased. Mild diffuse smooth right pleural thickening is similar to slightly increased, nonspecific, cannot exclude malignant pleural effusion. No discrete pleural nodularity.  PET 11/17/2019: Low-level hypermetabolism within the right pleural space, without focal abnormality to strongly suggest pleural metastasis. Hypermetabolism along the course of the common duct stent, nonspecific and most likely reactive.  Otherwise, no evidence of hypermetabolic metastasis.  PET 10/20/2019:Redemonstrated post treatment and post stenting appearance of a central cholangiocarcinoma, which is not directly appreciated by CT.  Common bile duct stent remains in position with post stenting pneumobilia. Slight interval increase in a moderate to large right pleural effusion with associated atelectasis or consolidation. There may be some pleural nodularity about the right hemidiaphragm which appears new compared to prior examination, highly suspicious  for pleural metastatic disease. No direct evidence of metastatic disease in the chest, abdomen, or pelvis.   Biopsies of   Past/Anticipated interventions by surgeon, if any:   Past/Anticipated interventions by medical oncology, if any:  Dr. Burr Medico 04/28/2020 -He wasdiagnosedin 12/2018. His work up revealed malignant stricture and CBD obstruction required stenting. Brushing revealed adenocarcinoma. His extrahepatic cholangiocarcinoma was not resectable due to vascular invasion(surgeon - Dr. Zenia Resides at [facility] started first line chemogemcitabine and cisplatin on days 1 and 8 every 21 dayson6/12/20. Abraxane added with cycle 2.AddedGCSF ([name])to day 9 startingcycle 2.Abraxane stopped after C4 and chemo held9/3/20-10/2/20 due to poor toleration/hospitalization. He has improved with dose reduction.Cisplatin held starting with C13due to decreased kidney function. Single agent Gemcitabine held since 04/14/20 to proceed with chemo break.  -He met with surgeon Dr. Carlis Abbott on 2/22/21who does not recommend surgery at this time given his decreased performance statusand technically difficult resection. Will continue with treatment.  -I discussed the option of endoscopy for further evaluation and possibletumorbiopsy for molecular testing with Dr Benson Norway. He declined.  -I again discussed continuingchemo with change of regimen to FOLFOX or proceed with palliative radiation to reduce tumor burdenand control his diseaseso he can take a longer chemo break.He is interested in RT. I have referred him to rad/onc Dr. Lisbeth Renshaw but he has not been reached by him. I will check on this.  -He does not feel it's important for him to live longer.  We discussed the quality of life, which is much more important for him now.  In this situation, all unlikely to restart chemotherapy due to his goal of care.    Weight changes, if any: Gradual weight loss.  He has lost 10 pounds in the last 3 months.  Bowel/Bladder  complaints, if any: No  Nausea / Vomiting, if any: Has nausea a couple days a week.  Pain issues, if any: He has  pain in his lower back bilateral legs, and stomach, especially after eating.    SAFETY ISSUES:  Prior radiation? No  Pacemaker/ICD? No  Possible current pregnancy? n/a  Is the patient on methotrexate? No  Current Complaints/Details:

## 2020-05-08 NOTE — Telephone Encounter (Signed)
It looks like this was already filled.  Please approve or deny.  Gardiner Rhyme, RN

## 2020-05-09 ENCOUNTER — Encounter: Payer: Self-pay | Admitting: Radiation Oncology

## 2020-05-09 ENCOUNTER — Other Ambulatory Visit: Payer: Self-pay

## 2020-05-09 ENCOUNTER — Ambulatory Visit
Admission: RE | Admit: 2020-05-09 | Discharge: 2020-05-09 | Disposition: A | Discharge status: Home / Self Care | Payer: Medicare Other | Source: Ambulatory Visit | Attending: Radiation Oncology | Admitting: Radiation Oncology

## 2020-05-09 DIAGNOSIS — E785 Hyperlipidemia, unspecified: Secondary | ICD-10-CM | POA: Diagnosis not present

## 2020-05-09 DIAGNOSIS — Z79899 Other long term (current) drug therapy: Secondary | ICD-10-CM | POA: Insufficient documentation

## 2020-05-09 DIAGNOSIS — K861 Other chronic pancreatitis: Secondary | ICD-10-CM | POA: Insufficient documentation

## 2020-05-09 DIAGNOSIS — K4020 Bilateral inguinal hernia, without obstruction or gangrene, not specified as recurrent: Secondary | ICD-10-CM | POA: Insufficient documentation

## 2020-05-09 DIAGNOSIS — J90 Pleural effusion, not elsewhere classified: Secondary | ICD-10-CM | POA: Diagnosis not present

## 2020-05-09 DIAGNOSIS — E109 Type 1 diabetes mellitus without complications: Secondary | ICD-10-CM | POA: Insufficient documentation

## 2020-05-09 DIAGNOSIS — Z9049 Acquired absence of other specified parts of digestive tract: Secondary | ICD-10-CM | POA: Insufficient documentation

## 2020-05-09 DIAGNOSIS — B181 Chronic viral hepatitis B without delta-agent: Secondary | ICD-10-CM | POA: Diagnosis not present

## 2020-05-09 DIAGNOSIS — N529 Male erectile dysfunction, unspecified: Secondary | ICD-10-CM | POA: Diagnosis not present

## 2020-05-09 DIAGNOSIS — C221 Intrahepatic bile duct carcinoma: Secondary | ICD-10-CM | POA: Insufficient documentation

## 2020-05-09 DIAGNOSIS — I700 Atherosclerosis of aorta: Secondary | ICD-10-CM | POA: Diagnosis not present

## 2020-05-09 DIAGNOSIS — I10 Essential (primary) hypertension: Secondary | ICD-10-CM | POA: Insufficient documentation

## 2020-05-09 DIAGNOSIS — G629 Polyneuropathy, unspecified: Secondary | ICD-10-CM | POA: Diagnosis not present

## 2020-05-09 NOTE — Progress Notes (Signed)
Radiation Oncology         [phone] ________________________________  Name: Christopher Welch        MRN: [id number]  Date of Service: 05/09/2020 DOB: 1946-03-12  GX:[id number], Hinton Dyer, DO  [name], MD     REFERRING PHYSICIAN: [name], MD   DIAGNOSIS: The encounter diagnosis was Cholangiocarcinoma [facility]).   HISTORY OF PRESENT ILLNESS: Christopher Welch is a 74 y.o. male seen at the request of Dr. Burr Medico for a history of extrahepatic cholangiocarcinoma.  The patient initially presented in April 2020, and imaging at that time revealed intrahepatic and extrahepatic biliary dilatation, MRI on 01/20/2019 showed central tumor in the biliary tract at the confluence of the common hepatic duct cystic duct and common bile duct.  Mild ductal dilatation of the main pancreatic duct throughout the distal past body and tail of the pancreas with branch ectasia was noted and brushings during ERCP on 01/21/2020 were consistent with adenocarcinoma.  Biliary sphincterotomy was performed at that time, and a plastic stent was placed.  Further imaging at [facility] revealed approximately 1.6 cm of soft tissue between the common hepatic artery portal vein and common bile duct, periportal adenopathy was identified, and he was started on systemic therapy.  He was not felt to be a surgical candidate due to the vascular involvement.  He initially was on gemcitabine and cisplatin, Abraxane was added, and GCSF was also added.  Abraxane had to be stopped after cycle 4 due to poor tolerance and hospitalization, with cycle #6 cisplatin was discontinued and the patient has been off single agent gemcitabine.  This was held due to potential options to break from chemotherapy and he is seen today to discuss after break with radiotherapy being the interval treatment.  His most recent CT imaging was on 04/12/2020 which showed enlargement of the periportal nodes and a new duodenal wall thickening concerning for disease progression.     PREVIOUS  RADIATION THERAPY: No   PAST MEDICAL HISTORY:  Past Medical History:  Diagnosis Date  . Diabetes mellitus type I, controlled ([place])   . Dyslipidemia   . ED (erectile dysfunction)   . Essential hypertension   . Gilbert's disease   . Hepatitis B carrier ([place])        PAST SURGICAL HISTORY: Past Surgical History:  Procedure Laterality Date  . APPENDECTOMY  1964  . BILIARY BRUSHING  01/21/2019   Procedure: BILIARY BRUSHING;  Surgeon: Carol Ada, MD;  Location: [facility];  Service: Endoscopy;;  . BILIARY STENT PLACEMENT N/A 01/21/2019   Procedure: BILIARY STENT PLACEMENT;  Surgeon: Carol Ada, MD;  Location: [facility];  Service: Endoscopy;  Laterality: N/A;  . ENDOSCOPIC RETROGRADE CHOLANGIOPANCREATOGRAPHY (ERCP) WITH PROPOFOL N/A 01/21/2019   Procedure: ENDOSCOPIC RETROGRADE CHOLANGIOPANCREATOGRAPHY (ERCP) WITH PROPOFOL;  Surgeon: Carol Ada, MD;  Location: [facility];  Service: Endoscopy;  Laterality: N/A;  . ESOPHAGOGASTRODUODENOSCOPY (EGD) WITH PROPOFOL N/A 01/21/2019   Procedure: ESOPHAGOGASTRODUODENOSCOPY (EGD) WITH PROPOFOL;  Surgeon: Carol Ada, MD;  Location: [facility];  Service: Endoscopy;  Laterality: N/A;  . FEMORAL HERNIA REPAIR    . IR CHOLANGIOGRAM EXISTING TUBE  05/13/2019  . IR CHOLANGIOGRAM EXISTING TUBE  06/08/2019  . IR EXCHANGE BILIARY DRAIN  05/18/2019  . IR EXCHANGE BILIARY DRAIN  06/08/2019  . IR IMAGING GUIDED PORT INSERTION  03/08/2019  . IR PERC CHOLECYSTOSTOMY  05/04/2019  . IR THORACENTESIS ASP PLEURAL SPACE W/IMG GUIDE  10/29/2019  . LEFT HEART CATH AND CORONARY ANGIOGRAPHY N/A 05/19/2017   Procedure: LEFT HEART  CATH AND CORONARY ANGIOGRAPHY;  Surgeon: Leonie Man, MD;  Location: [facility];  Service: Cardiovascular;  Laterality: N/A;  . SPHINCTEROTOMY  01/21/2019   Procedure: SPHINCTEROTOMY;  Surgeon: Carol Ada, MD;  Location: [facility];  Service: Endoscopy;;  . UPPER ESOPHAGEAL ENDOSCOPIC ULTRASOUND (EUS) N/A 01/21/2019    Procedure: UPPER ESOPHAGEAL ENDOSCOPIC ULTRASOUND (EUS);  Surgeon: Carol Ada, MD;  Location: [facility];  Service: Endoscopy;  Laterality: N/A;     FAMILY HISTORY:  Family History  Problem Relation Age of Onset  . Diabetes Father      SOCIAL HISTORY:  reports that he has never smoked. He has never used smokeless tobacco. He reports current alcohol use of about 7.0 standard drinks of alcohol per week. He reports that he does not use drugs. The patient is single and lives in [place]. He works two days a week at [employer] in town. 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   ALLERGIES: Erythromycin   MEDICATIONS:  Current Outpatient Medications  Medication Sig Dispense Refill  . amLODipine (NORVASC) 5 MG tablet Take 5 mg by mouth daily.     Marland Kitchen atenolol (TENORMIN) 25 MG tablet Take 25 mg by mouth daily. Once a day     . atorvastatin (LIPITOR) 10 MG tablet Take 10 mg by mouth daily.    . Blood Glucose Monitoring Suppl (ACCU-CHEK NANO SMARTVIEW) W/DEVICE KIT 1 kit by Does not apply route 2 (two) times daily. (Patient taking differently: 1 kit by Does not apply route See admin instructions. Test blood sugars 12x's daily) 1 kit 0  . clonazePAM (KLONOPIN) 0.5 MG tablet     . glucose blood test strip Test 3 times a day. (Patient taking differently: 1 each by Other route See admin instructions. Test 12 times a day.) 300 each Prn  . insulin NPH Human (NOVOLIN N) 100 UNIT/ML injection Inject 20 Units into the skin at bedtime.     . insulin regular (NOVOLIN R) 100 units/mL injection Inject 20 Units into the skin 3 (three) times daily before meals.     . lidocaine-prilocaine (EMLA) cream Apply to affected area once 30 g 3  . lisinopril (ZESTRIL) 20 MG tablet Take 20 mg by mouth daily.     . magnesium oxide (MAG-OX) 400 MG tablet TAKE 1 TABLET BY MOUTH EVERY DAY 90 tablet 1  . mirtazapine (REMERON) 7.5 MG tablet TAKE 1 TABLET (7.5 MG TOTAL)  BY MOUTH AT BEDTIME. 90 tablet 1  . ondansetron (ZOFRAN) 8 MG tablet TAKE 1 TABLET BY MOUTH 2 TIMES DAY AS NEEDED. START ON 3RD DAY AFTER CHEMOTHERAPY 30 tablet 1  . oxycodone (OXY-IR) 5 MG capsule Take 1 capsule (5 mg total) by mouth every 6 (six) hours as needed for pain (severe pain). 90 capsule 0  . pantoprazole (PROTONIX) 40 MG tablet TAKE 1 TABLET BY MOUTH EVERY DAY 30 tablet 0  . prochlorperazine (COMPAZINE) 10 MG tablet TAKE 1 TABLET (10 MG TOTAL) BY MOUTH EVERY 6 (SIX) HOURS AS NEEDED (NAUSEA OR VOMITING). 30 tablet 1  . zolpidem (AMBIEN) 5 MG tablet Take 1-2 tablets (5-10 mg total) by mouth at bedtime as needed for sleep. 60 tablet 0  . linaclotide (LINZESS) 145 MCG CAPS capsule Take 1 capsule (145 mcg total) by mouth daily before breakfast. (Patient not taking: Reported on 05/09/2020) 30 capsule 2   No current facility-administered medications for this encounter.   Facility-Administered Medications Ordered  in Other Encounters  Medication Dose Route Frequency Provider Last Rate Last Admin  . 0.9 %  sodium chloride infusion (Manually program via Guardrails IV Fluids)  250 mL Intravenous Once [name], MD         REVIEW OF SYSTEMS: On review of systems, the patient reports that he is doing okay. He does have episodes of abdominal pain and reports a poor appetite. He describes the pain as frequent but does not seem to keep him from being somewhat active. He does have pain that is described as sometimes shooting down from the low back and into his upper legs. He does not have weakness. He denies any chest pain, shortness of breath, cough, fevers, chills, night sweats, unintended weight changes. He denies any  bladder disturbances, but does struggle with chronic constipation as a result of medication, and denies current nausea or vomiting. He denies any new musculoskeletal or joint aches or pains. A complete review of systems is obtained and is otherwise negative.     PHYSICAL EXAM:  Wt  Readings from Last 3 Encounters:  05/09/20 145 lb 2 oz (65.8 kg)  04/28/20 146 lb 9.6 oz (66.5 kg)  04/07/20 147 lb 12.8 oz (67 kg)   Temp Readings from Last 3 Encounters:  05/09/20 98.1 F (36.7 C) (Oral)  04/28/20 97.8 F (36.6 C) (Temporal)  04/07/20 98.2 F (36.8 C) (Temporal)   BP Readings from Last 3 Encounters:  05/09/20 111/68  04/28/20 113/67  04/07/20 117/66   Pulse Readings from Last 3 Encounters:  05/09/20 82  04/28/20 82  04/07/20 68   Pain Assessment Pain Score: 6  Pain Loc: Abdomen (Stomach,lower back, bilateral legs')/10  In general this is a thin, elderly appearing caucasian male in no acute distress. He's alert and oriented x4 and appropriate throughout the examination. Cardiopulmonary assessment is negative for acute distress and he exhibits normal effort.   ECOG = 1  0 - Asymptomatic (Fully active, able to carry on all predisease activities without restriction)  1 - Symptomatic but completely ambulatory (Restricted in physically strenuous activity but ambulatory and able to carry out work of a light or sedentary nature. For example, light housework, office work)  2 - Symptomatic, <50% in bed during the day (Ambulatory and capable of all self care but unable to carry out any work activities. Up and about more than 50% of waking hours)  3 - Symptomatic, >50% in bed, but not bedbound (Capable of only limited self-care, confined to bed or chair 50% or more of waking hours)  4 - Bedbound (Completely disabled. Cannot carry on any self-care. Totally confined to bed or chair)  5 - Death   Eustace Pen MM, Creech RH, Tormey DC, et al. [phone]). "Toxicity and response criteria of the [facility]". [place] Oncol. 5 (6): 649-55    LABORATORY DATA:  Lab Results  Component Value Date   WBC 8.5 04/28/2020   HGB 9.9 (L) 04/28/2020   HCT 29.6 (L) 04/28/2020   MCV 90.8 04/28/2020   PLT 204 04/28/2020   Lab Results  Component Value Date    NA 138 04/28/2020   K 4.0 04/28/2020   CL 105 04/28/2020   CO2 23 04/28/2020   Lab Results  Component Value Date   ALT 36 04/28/2020   AST 25 04/28/2020   ALKPHOS 113 04/28/2020   BILITOT 0.5 04/28/2020      RADIOGRAPHY: CT Abdomen Pelvis Wo Contrast  Result Date: 04/12/2020 CLINICAL DATA:  Cholangiocarcinoma, chemotherapy in progress. Shortness of breath, abdominal pain, nausea, vomiting, diarrhea and constipation. EXAM: CT CHEST, ABDOMEN AND PELVIS WITHOUT CONTRAST TECHNIQUE: Multidetector CT imaging of the chest, abdomen and pelvis was performed following the standard protocol without IV contrast. COMPARISON:  02/03/2020. FINDINGS: CT CHEST FINDINGS Cardiovascular: Right IJ Port-A-Cath terminates in the right atrium. Atherosclerotic calcification of the aorta, aortic valve and coronary arteries. Heart size normal. No pericardial effusion. Pulmonic trunk is at the limits of in size. Mediastinum/Nodes: No pathologically enlarged mediastinal or axillary lymph nodes. Hilar regions are difficult to definitively evaluate without IV contrast. Esophagus is unremarkable. Lungs/Pleura: Peripheral pulmonary nodules measure up to 4 mm in the right upper lobe (6/38), stable. Moderate right pleural effusion with associated pleural thickening and right lower lobe compressive volume loss, stable. No new pulmonary parenchymal findings. Airway is unremarkable. Musculoskeletal: No worrisome lytic or sclerotic lesions. CT ABDOMEN PELVIS FINDINGS Hepatobiliary: Pneumobilia related to an indwelling common bile duct wall stent stable. Liver is otherwise unremarkable. Gallbladder is dilated. Soft tissue thickening surrounding the lower common bile duct appears similar to 02/03/2020. Lack of IV contrast is limiting Pancreas: Scattered calcifications.  No ductal dilatation. Spleen: Negative. Adrenals/Urinary Tract: Adrenal glands are unremarkable. Low-attenuation lesions in the right kidney measure up to 4.1 cm and are  likely cysts although definitive characterization is limited without post-contrast imaging. Kidneys are otherwise unremarkable. Ureters are decompressed. Bladder is low in volume. Stomach/Bowel: Stomach is unremarkable. There is wall thickening involving the descending duodenum, new from prior. Small bowel is otherwise unremarkable. A fair amount of stool is seen in the colon. Patient is reportedly status post appendectomy. Vascular/Lymphatic: Atherosclerotic calcification of the aorta without aneurysm. Porta hepatis lymph node measures 11 mm, increased from 7 mm. Portacaval lymph node measures 1.4 cm, increased from 6 mm 02/03/2020. Reproductive: Prostate is visualized. Other: Bilateral inguinal hernias contain fat. No free fluid. Mesenteries and peritoneum are unremarkable. Musculoskeletal: Degenerative changes in the spine. No worrisome lytic or sclerotic lesions. IMPRESSION: 1. Enlarging periportal lymph nodes and new duodenal wall thickening are worrisome for disease progression. An infectious or inflammatory process is not excluded. Soft tissue thickening surrounding the lower common bile duct with wall stent in place, stable. 2. Chronic-appearing moderate right pleural effusion, stable. 3. Stable pulmonary nodularity. Continued attention on follow-up is recommended. 4. Fair amount of stool in the colon is indicative of constipation. 5. Chronic pancreatitis. 6. Aortic atherosclerosis (ICD10-I70.0). Coronary artery calcification. Electronically Signed   By: Lorin Picket M.D.   On: 04/12/2020 09:26   CT Chest Wo Contrast  Result Date: 04/12/2020 CLINICAL DATA:  Cholangiocarcinoma, chemotherapy in progress. Shortness of breath, abdominal pain, nausea, vomiting, diarrhea and constipation. EXAM: CT CHEST, ABDOMEN AND PELVIS WITHOUT CONTRAST TECHNIQUE: Multidetector CT imaging of the chest, abdomen and pelvis was performed following the standard protocol without IV contrast. COMPARISON:  02/03/2020. FINDINGS:  CT CHEST FINDINGS Cardiovascular: Right IJ Port-A-Cath terminates in the right atrium. Atherosclerotic calcification of the aorta, aortic valve and coronary arteries. Heart size normal. No pericardial effusion. Pulmonic trunk is at the limits of in size. Mediastinum/Nodes: No pathologically enlarged mediastinal or axillary lymph nodes. Hilar regions are difficult to definitively evaluate without IV contrast. Esophagus is unremarkable. Lungs/Pleura: Peripheral pulmonary nodules measure up to 4 mm in the right upper lobe (6/38), stable. Moderate right pleural effusion with associated pleural thickening and right lower lobe compressive volume loss, stable. No new pulmonary parenchymal findings. Airway is unremarkable. Musculoskeletal: No worrisome lytic or sclerotic lesions. CT ABDOMEN PELVIS  FINDINGS Hepatobiliary: Pneumobilia related to an indwelling common bile duct wall stent stable. Liver is otherwise unremarkable. Gallbladder is dilated. Soft tissue thickening surrounding the lower common bile duct appears similar to 02/03/2020. Lack of IV contrast is limiting Pancreas: Scattered calcifications.  No ductal dilatation. Spleen: Negative. Adrenals/Urinary Tract: Adrenal glands are unremarkable. Low-attenuation lesions in the right kidney measure up to 4.1 cm and are likely cysts although definitive characterization is limited without post-contrast imaging. Kidneys are otherwise unremarkable. Ureters are decompressed. Bladder is low in volume. Stomach/Bowel: Stomach is unremarkable. There is wall thickening involving the descending duodenum, new from prior. Small bowel is otherwise unremarkable. A fair amount of stool is seen in the colon. Patient is reportedly status post appendectomy. Vascular/Lymphatic: Atherosclerotic calcification of the aorta without aneurysm. Porta hepatis lymph node measures 11 mm, increased from 7 mm. Portacaval lymph node measures 1.4 cm, increased from 6 mm 02/03/2020. Reproductive:  Prostate is visualized. Other: Bilateral inguinal hernias contain fat. No free fluid. Mesenteries and peritoneum are unremarkable. Musculoskeletal: Degenerative changes in the spine. No worrisome lytic or sclerotic lesions. IMPRESSION: 1. Enlarging periportal lymph nodes and new duodenal wall thickening are worrisome for disease progression. An infectious or inflammatory process is not excluded. Soft tissue thickening surrounding the lower common bile duct with wall stent in place, stable. 2. Chronic-appearing moderate right pleural effusion, stable. 3. Stable pulmonary nodularity. Continued attention on follow-up is recommended. 4. Fair amount of stool in the colon is indicative of constipation. 5. Chronic pancreatitis. 6. Aortic atherosclerosis (ICD10-I70.0). Coronary artery calcification. Electronically Signed   By: Lorin Picket M.D.   On: 04/12/2020 09:26       IMPRESSION/PLAN: 1. [id number] extrahepatic cholangiocarcinoma. Dr. Lisbeth Renshaw discusses the pathology findings and reviews the nature of cholangiocarcinomas and reviews his course. The patient is offered the option of palliative therapy with either a 2 week course of radiotherapy versus a more definitive course of 5 1/2 weeks though under the guidance that this would still be a palliative intention.  We discussed the risks, benefits, short, and long term effects of radiotherapy, and the patient is not quite sure how he would like to proceed. He is leaning more towards additional chemotherapy and would need to discuss this further with Dr. Burr Medico. We will follow up with him by phone in the next few days to see how he would like to proceed.  In a visit lasting 60 minutes, greater than 50% of the time was spent face to face discussing the patient's condition, in preparation for the discussion, and coordinating the patient's care.   The above documentation reflects my direct findings during this shared patient visit. Please see the separate note by Dr.  Lisbeth Renshaw on this date for the remainder of the patient's plan of care.    Carola Rhine, PAC

## 2020-05-11 ENCOUNTER — Encounter: Payer: Self-pay | Admitting: General Practice

## 2020-05-11 NOTE — Progress Notes (Signed)
[facility] Spiritual Care Note  Followed up with Christopher Welch by phone. He was in good spirits, reporting increased contact from friends and community, as well as having reached clarity about not doing radiation at this time (which he plans to share with Dr Lisbeth Renshaw and Bryson Ha Perkins/PA). He is hopeful that periodic chemo may continue to be an option. Yesterday he was very active with errands, and today he is resting; adjusting to this rhythm helps him continue to engage with meaning, purpose, and social connection. We plan to follow up by phone or on campus.   [place], [place], [facility] Pager [phone] Voicemail [phone]

## 2020-05-14 ENCOUNTER — Other Ambulatory Visit: Payer: Self-pay | Admitting: Hematology

## 2020-05-15 ENCOUNTER — Telehealth: Payer: Self-pay | Admitting: Radiation Oncology

## 2020-05-15 NOTE — Telephone Encounter (Signed)
I called to confirm with the patient that he did not wish to proceed with radiotherapy. I was unable to reach him, but he has an appointment with Dr. Burr Medico later this week. He has mentioned to our nursing team and to Lorrin Jackson he does not wish to proceed with radiation. We will follow along expectantly and see him back as needed.    Carola Rhine, PAC

## 2020-05-18 NOTE — Progress Notes (Signed)
Christopher Welch   Telephone:(336) [phone] Fax:([phone]) [phone]   Clinic Follow up Note   Patient Care Team: [name], DO as PCP - General (Family Medicine)  Date of Service:  05/19/2020  CHIEF COMPLAINT: F/u of ExtrahepaticCholangiocarcinoma  SUMMARY OF ONCOLOGIC HISTORY: Oncology History Overview Note  Cancer Staging Cholangiocarcinoma [facility]) Staging form: Perihilar Bile Ducts, AJCC 8th Edition - Clinical: Stage Unknown (cTX, cN0, cM0) - Signed by [name], MD on 03/19/2019    Cholangiocarcinoma ([place])  01/14/2019 Imaging   US Abdomen 01/14/19  IMPRESSION: There is significant intrahepatic and extrahepatic biliary dilatation present, concerning for distal common bile duct obstruction. Further evaluation with CT scan or MRCP is recommended. Correlation with liver function tests is recommended as well. These results will be called to the ordering clinician or representative by the Radiologist Assistant, and communication documented in the PACS or zVision Dashboard.   Probable large amount of sludge seen within gallbladder lumen with mild gallbladder wall thickening. However, no cholelithiasis, pericholecystic fluid or sonographic Murphy's sign is noted.   4.2 cm septated cyst seen in upper pole of right kidney consistent with Bosniak type 2 lesion. Follow-up ultrasound in 1 year is recommended to ensure stability.   01/20/2019 Imaging   MRI abdomen 01/20/19  IMPRESSION: 1. Findings are highly concerning for central tumor in the biliary tract at the confluence of the common hepatic duct, cystic duct and common bile ducts. Further clinical evaluation for potential cholangiocarcinoma is strongly recommended. 2. Mild ductal dilatation of the main pancreatic duct throughout the distal body and tail of the pancreas where there is also some associated side branch ectasia. This may suggest a pancreatic ductal stricture. No obstructing pancreatic neoplasm  identified. 3. Aortic atherosclerosis.   01/21/2019 Initial Biopsy   Diagnosis 01/21/19  BILE DUCT BRUSHING (SPECIMEN 1 OF 1 COLLECTED 01/21/2019) ADENOCARCINOMA.   01/21/2019 Procedure   ERCP By Dr hung 01/21/19 IMRPESSION - The major papilla appeared normal. - A single localized biliary stricture was found in the middle third of the main bile duct. - The entire main bile duct and upper third of the main bile duct were dilated, secondary to a stricture. - A biliary sphincterotomy was performed. - Cells for cytology obtained in the middle third of the main bile duct. - One plastic stent was placed into the common bile duct.  EUS by Dr hung 01/21/19  IMPRESSION - There was dilation in the middle third of the main bile duct and in the upper third of the main bile duct which measured up to 15 mm. - There was a suggestion of a stricture in the middle third of the main bile duct. - No specimens collected.   02/03/2019 Imaging   CT CAP at [facility] 02/03/19  Impression:  1. There is mild intrahepatic and extrahepatic biliary ductal dilation and diffuse common bile duct wall thickening with stent in place. There is abnormal soft tissue measuring approximately 1.6 cm between the common hepatic artery, portal vein and common bile duct, worrisome for the known cholangiocarcinoma.  2. Periportal lymphadenopathy which may represent nodal metastasis.  4. Marked pancreatic atrophy and duct dilation involving the distal body and tail the pancreas where there is a coarse calcification. These findings are favored to represent stenosis from intraductal stones though an underlying stricture cannot be completely excluded.  5. Atypical symmetric prominent fat stranding of the bilateral lower abdominal wall. Correlate with surgical history or history of history of trauma.   03/08/2019 Initial Diagnosis  Cholangiocarcinoma ([place])   03/12/2019 - 04/14/2020 Chemotherapy   Gemcitabine and cisplatin on days 1  and 8 every 21 days starting 03/12/19. Abraxane added with cycle 2. Added GCSF ([name]) to day 9 starting cycle 2.  Abraxane stopped after C4 and chemo held 06/03/19-07/02/19 due to poor toleration/hospitalization. Cisplatin removed starting from cycle 13 due to renal insufficiency. Single agent Gemcitabine held since 04/14/20 to proceed with Target Radiation and chemo break, due to possible disease progression.    03/19/2019 Cancer Staging   Staging form: Perihilar Bile Ducts, AJCC 8th Edition - Clinical: Stage Unknown (cTX, cN0, cM0) - Signed by [name], MD on 03/19/2019   05/24/2019 Imaging   CT CAP W Contrast at [facility]  1.  Interval placement of covered metallic biliary stent. No progressive dilation of the biliary tree and resulting resolution of the prior main pancreatic duct dilation. 2.  Evidence of some subtle vascular contour deformity involving the hepatic artery, suspected to be from tumoral contact and/or posttreatment changes. 3.  New small right pleural effusion with overlying mild airspace disease. Secondary findings which may indicate a component of aspiration. 4.  No convincing evidence of new disseminated metastatic disease. 5.  Additional findings as discussed above.   06/18/2019 Imaging   CT AP IMPRESSION: 1.  No acute findings in the abdomen/pelvis. 2. Biliary stent in adequate position. No evidence of focal mass or adenopathy in the region of the pancreatic head or porta hepatis. 3. Moderate size right pleural effusion with associated right basilar atelectasis. 4. Possible single gallstone. Stable subcentimeter liver cyst. Stable right renal cysts. Small left inguinal hernia containing only peritoneal fat. 5. Aortic Atherosclerosis (ICD10-I70.0).   07/28/2019 Imaging   CT CAP IMPRESSION: 1. The patient is status post common bile duct stent placement, with unchanged stent position and patent appearance with pneumobilia. Primary cholangiocarcinoma is not discretely  appreciated by CT. 2. No direct evidence of metastatic disease in the chest, abdomen, or pelvis. 3. Moderate right pleural effusion with associated atelectasis or consolidation, slightly improved compared to prior examination. 4. The pancreatic parenchyma is atrophic and calcified, particularly in the pancreatic tail. 5.  Coronary artery disease.  Aortic atherosclerosis   10/20/2019 PET scan   IMPRESSION: 1. Redemonstrated post treatment and post stenting appearance of a central cholangiocarcinoma, which is not directly appreciated by CT. Common bile duct stent remains in position with post stenting pneumobilia. 2. Slight interval increase in a moderate to large right pleural effusion with associated atelectasis or consolidation. There may be some pleural nodularity about the right hemidiaphragm which appears new compared to prior examination (series 2, image 40), highly suspicious for pleural metastatic disease. 3. No direct evidence of metastatic disease in the chest, abdomen, or pelvis. 4. Coronary artery disease.  Aortic Atherosclerosis (ICD10-I70.0).     10/29/2019 Pathology Results   Thoracentesis FINAL MICROSCOPIC DIAGNOSIS:  - Reactive mesothelial cells present    11/17/2019 PET scan   IMPRESSION: 1. Low-level hypermetabolism within the right pleural space, without focal abnormality to strongly suggest pleural metastasis. 2. Hypermetabolism along the course of the common duct stent, nonspecific and most likely reactive. 3. Otherwise, no evidence of hypermetabolic metastasis. 4. Small to moderate right pleural effusion with developing loculation superiorly. 5. Coronary artery atherosclerosis. Aortic Atherosclerosis (ICD10-I70.0). 6.  Possible constipation.     02/03/2020 Imaging   CT CAP IMPRESSION: 1. Stable indistinct circumferential biliary wall thickening in the proximal CBD, without discrete biliary mass. Stable well-positioned CBD stent with intrahepatic  pneumobilia indicating patency.  2. No findings of metastatic disease in the abdomen or pelvis. 3. Small dependent right pleural effusion is decreased. Mild diffuse smooth right pleural thickening is similar to slightly increased, nonspecific, cannot exclude malignant pleural effusion. No discrete pleural nodularity. 4. Tiny clustered tree-in-bud type nodules in the anterior left upper lobe, slightly increased, nonspecific, more likely inflammatory. Recommend attention on follow-up chest CT in 3 months. 5. Chronic findings include: Chronic pancreatitis. Coronary atherosclerosis. Aortic Atherosclerosis (ICD10-I70.0).     02/21/2020 Imaging   US Abdomen  IMPRESSION: Mildly distended gallbladder without visible wall thickening or stones.   Common bile duct stent in place. Slight prominence of intrahepatic biliary ducts in the left hepatic lobe.   Increased echotexture of the liver suggesting fatty infiltration.   04/12/2020 Imaging   CT CAP W contrast  IMPRESSION: 1. Enlarging periportal lymph nodes and new duodenal wall thickening are worrisome for disease progression. An infectious or inflammatory process is not excluded. Soft tissue thickening surrounding the lower common bile duct with wall stent in place, stable. 2. Chronic-appearing moderate right pleural effusion, stable. 3. Stable pulmonary nodularity. Continued attention on follow-up is recommended. 4. Fair amount of stool in the colon is indicative of constipation. 5. Chronic pancreatitis. 6. Aortic atherosclerosis (ICD10-I70.0). Coronary artery calcification.      CURRENT THERAPY:  Supportive Care   INTERVAL HISTORY:  Christopher Welch is here for a follow up. He presents to the clinic alone. He notes he is doing fairly but has stomach aches after eating. This is better with smaller meals. For his abdominal pain he is taking Oxycodone 57m twice a day and sometimes three times a day. He notes this mostly helps his pain.  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 His work is very flexible with his time and health.  He [place] with Dr [place] [place]and [place] He notes based on what this entailed but is not willing to proceed with daily treatment.    REVIEW OF SYSTEMS:   Constitutional: Denies fevers, chills or abnormal weight loss Eyes: Denies blurriness of vision Ears, nose, mouth, throat, and face: Denies mucositis or sore throat Respiratory: Denies cough, dyspnea or wheezes Cardiovascular: Denies palpitation, chest discomfort or lower extremity swelling Gastrointestinal:  Denies nausea, heartburn or change in bowel habits (+) Abdominal pain (4-5/10) Skin: Denies abnormal skin rashes Lymphatics: Denies new lymphadenopathy or easy bruising Neurological:Denies numbness, tingling or new weaknesses Behavioral/Psych: Mood is stable, no new changes  All other systems were reviewed with the patient and are negative.  MEDICAL HISTORY:  Past Medical History:  Diagnosis Date  . Diabetes mellitus type I, controlled ([place]   . Dyslipidemia   . ED (erectile dysfunction)   . Essential hypertension   . Gilbert's disease   . Hepatitis B carrier ([facility]     SURGICAL HISTORY: Past Surgical History:  Procedure Laterality Date  . APPENDECTOMY  1964  . BILIARY BRUSHING  01/21/2019   Procedure: BILIARY BRUSHING;  Surgeon: HCarol Ada MD;  Location: [facility];  Service: Endoscopy;;  . BILIARY STENT PLACEMENT N/A 01/21/2019   Procedure: BILIARY STENT PLACEMENT;  Surgeon: HCarol Ada MD;  Location: [facility];  Service: Endoscopy;  Laterality: N/A;  . ENDOSCOPIC RETROGRADE CHOLANGIOPANCREATOGRAPHY (ERCP) WITH PROPOFOL N/A 01/21/2019   Procedure: ENDOSCOPIC RETROGRADE CHOLANGIOPANCREATOGRAPHY (ERCP) WITH PROPOFOL;  Surgeon: HCarol Ada MD;  Location: [facility];  Service: Endoscopy;  Laterality: N/A;  . ESOPHAGOGASTRODUODENOSCOPY (EGD)  WITH PROPOFOL N/A 01/21/2019   Procedure: ESOPHAGOGASTRODUODENOSCOPY (EGD) WITH PROPOFOL;  Surgeon: Carol Ada, MD;  Location: [facility];  Service: Endoscopy;  Laterality: N/A;  . FEMORAL HERNIA REPAIR    . IR CHOLANGIOGRAM EXISTING TUBE  05/13/2019  . IR CHOLANGIOGRAM EXISTING TUBE  06/08/2019  . IR EXCHANGE BILIARY DRAIN  05/18/2019  . IR EXCHANGE BILIARY DRAIN  06/08/2019  . IR IMAGING GUIDED PORT INSERTION  03/08/2019  . IR PERC CHOLECYSTOSTOMY  05/04/2019  . IR THORACENTESIS ASP PLEURAL SPACE W/IMG GUIDE  10/29/2019  . LEFT HEART CATH AND CORONARY ANGIOGRAPHY N/A 05/19/2017   Procedure: LEFT HEART CATH AND CORONARY ANGIOGRAPHY;  Surgeon: Leonie Man, MD;  Location: [place] [facility];  Service: Cardiovascular;  Laterality: N/A;  . SPHINCTEROTOMY  01/21/2019   Procedure: SPHINCTEROTOMY;  Surgeon: Carol Ada, MD;  Location: [facility];  Service: Endoscopy;;  . UPPER ESOPHAGEAL ENDOSCOPIC ULTRASOUND (EUS) N/A 01/21/2019   Procedure: UPPER ESOPHAGEAL ENDOSCOPIC ULTRASOUND (EUS);  Surgeon: Carol Ada, MD;  Location: [facility];  Service: Endoscopy;  Laterality: N/A;    I have reviewed the social history and family history with the patient and they are unchanged from previous note.  ALLERGIES:  is allergic to erythromycin.  MEDICATIONS:  Current Outpatient Medications  Medication Sig Dispense Refill  . amLODipine (NORVASC) 5 MG tablet Take 5 mg by mouth daily.     Marland Kitchen atenolol (TENORMIN) 25 MG tablet Take 25 mg by mouth daily. Once a day     . atorvastatin (LIPITOR) 10 MG tablet Take 10 mg by mouth daily.    . Blood Glucose Monitoring Suppl (ACCU-CHEK NANO SMARTVIEW) W/DEVICE KIT 1 kit by Does not apply route 2 (two) times daily. (Patient taking differently: 1 kit by Does not apply route See admin instructions. Test blood sugars 12x's daily) 1 kit 0  . clonazePAM (KLONOPIN) 0.5 MG tablet     . glucose blood test strip Test 3 times a day. (Patient taking differently: 1 each by Other  route See admin instructions. Test 12 times a day.) 300 each Prn  . insulin NPH Human (NOVOLIN N) 100 UNIT/ML injection Inject 20 Units into the skin at bedtime.     . insulin regular (NOVOLIN R) 100 units/mL injection Inject 20 Units into the skin 3 (three) times daily before meals.     . lidocaine-prilocaine (EMLA) cream Apply to affected area once 30 g 3  . linaclotide (LINZESS) 145 MCG CAPS capsule Take 1 capsule (145 mcg total) by mouth daily before breakfast. (Patient not taking: Reported on 05/09/2020) 30 capsule 2  . lisinopril (ZESTRIL) 20 MG tablet Take 20 mg by mouth daily.     . magnesium oxide (MAG-OX) 400 MG tablet TAKE 1 TABLET BY MOUTH EVERY DAY 90 tablet 1  . mirtazapine (REMERON) 7.5 MG tablet TAKE 1 TABLET (7.5 MG TOTAL) BY MOUTH AT BEDTIME. 90 tablet 1  . ondansetron (ZOFRAN) 8 MG tablet TAKE 1 TABLET BY MOUTH 2 TIMES DAY AS NEEDED. START ON 3RD DAY AFTER CHEMOTHERAPY 30 tablet 1  . oxycodone (OXY-IR) 5 MG capsule Take 1 capsule (5 mg total) by mouth every 6 (six) hours as needed for pain (severe pain). 90 capsule 0  . pantoprazole (PROTONIX) 40 MG tablet TAKE 1 TABLET BY MOUTH EVERY DAY 90 tablet 1  . prochlorperazine (COMPAZINE) 10 MG tablet TAKE 1 TABLET (10 MG TOTAL) BY MOUTH EVERY 6 (SIX) HOURS AS NEEDED (NAUSEA OR VOMITING). 30 tablet 1  . zolpidem (AMBIEN) 5 MG  tablet Take 1-2 tablets (5-10 mg total) by mouth at bedtime as needed for sleep. 60 tablet 0   No current facility-administered medications for this visit.   Facility-Administered Medications Ordered in Other Visits  Medication Dose Route Frequency Provider Last Rate Last Admin  . 0.9 %  sodium chloride infusion (Manually program via Guardrails IV Fluids)  250 mL Intravenous Once [name], MD        PHYSICAL EXAMINATION: ECOG PERFORMANCE STATUS: 2 - Symptomatic, <50% confined to bed  Vitals:   05/19/20 0905  BP: 121/68  Pulse: 72  Resp: 18  Temp: 97.7 F (36.5 C)  SpO2: 99%   Filed Weights    05/19/20 0905  Weight: 146 lb 4.8 oz (66.4 kg)    Due to COVID19 we will limit examination to appearance. Patient had no complaints.  GENERAL:alert, no distress and comfortable SKIN: skin color normal, no rashes or significant lesions EYES: normal, Conjunctiva are pink and non-injected, sclera clear  NEURO: alert & oriented x 3 with fluent speech   LABORATORY DATA:  I have reviewed the data as listed CBC Latest Ref Rng & Units 05/19/2020 04/28/2020 04/07/2020  WBC 4.0 - 10.5 K/uL 6.6 8.5 7.4  Hemoglobin 13.0 - 17.0 g/dL 9.6(L) 9.9(L) 9.2(L)  Hematocrit 39 - 52 % 29.1(L) 29.6(L) 28.5(L)  Platelets 150 - 400 K/uL 183 204 270     CMP Latest Ref Rng & Units 05/19/2020 04/28/2020 04/07/2020  Glucose 70 - 99 mg/dL 108(H) 147(H) 271(H)  BUN 8 - 23 mg/dL 26(H) 25(H) 27(H)  Creatinine 0.61 - 1.24 mg/dL 1.41(H) 1.55(H) 1.79(H)  Sodium 135 - 145 mmol/L 138 138 137  Potassium 3.5 - 5.1 mmol/L 3.9 4.0 4.5  Chloride 98 - 111 mmol/L 105 105 103  CO2 22 - 32 mmol/L _0 Calcium 8.9 - 10.3 mg/dL 10.3 10.4(H) 9.7  Total Protein 6.5 - 8.1 g/dL 7.5 7.7 7.6  Total Bilirubin 0.3 - 1.2 mg/dL 1.0 0.5 0.6  Alkaline Phos 38 - 126 U/L 138(H) 113 62  AST 15 - 41 U/L 54(H) 25 21  ALT 0 - 44 U/L 59(H) 36 17      RADIOGRAPHIC STUDIES: I have personally reviewed the radiological images as listed and agreed with the findings in the report. No results found.   ASSESSMENT & PLAN:  HOLTON SIDMAN is a 74 y.o. male with    1. Extrahepatic cholangiocarcinoma, cTxN0Mx -He wasdiagnosedin 12/2018. His work up revealed malignant stricture and CBD obstruction required stenting. Brushing revealed adenocarcinoma. His extrahepatic cholangiocarcinoma was not resectable due to vascular invasion(surgeon - Dr. Zenia Resides at [facility] started first line chemogemcitabine and cisplatin on days 1 and 8 every 21 dayson6/12/20. Abraxane added with cycle 2.AddedGCSF ([name])to day 9 startingcycle 2.Abraxane stopped  after C4 and chemo held9/3/20-10/2/20 due to poor toleration/hospitalization. He has improved with dose reduction.Cisplatin held starting with C13due to decreased kidney function. Single agent Gemcitabine held since 04/14/20 to proceed with chemo break.  -He met with surgeon Dr. Carlis Abbott on 2/22/21who does not recommend surgery at this time given his decreased performance statusand technically difficult resection. Will continue with treatment.  -His CT CAP from 04/12/20 showedEnlarging periportal lymph nodes and new duodenal wall thickening are worrisome for disease progressionor Inflammation.  -I discussed the option of endoscopy for further evaluation and possibletumorbiopsy for molecular testing with Dr Benson Norway. He declined.  -He consulted with Dr Lisbeth Renshaw recently and declined RT, he does not think he can come here daily for radiation, he  is not interested in prolong his life -I discussed chemo options, including FOLFOX or single agents xeloda.  We discussed that the goal of therapy is palliative.  Patient is very clear that he is not interested in prolong his life, he is much more valued his quality of life.  After lengthy discussion, he declined chemo, which is very reasonable decision and I agree and support his decision. -I discussed the options of continuing supportive care with our clinic or with home hospice care.  Patient opted to continue follow-up with [place].  He is open to hospice, but does not feel he needs it now.  He is to enjoy his independent living, and works 7-8 hours a week  -F/u in 3-4 weeks.    2. Supportive Care: Abdominal pain, Low appetite, Weight loss  -Secondary to #1 -Based on duodenal thickening on 04/12/20 CT scan, his recently increased abdominal pain could be related to disease progression. -His abdominal pain is mainly only exacerbated by how much he eats. His pain is 4-5/10 lately and mostly controlled with Oxycodone 68m 2-3 tabs a day.  -To avoid constipation he has  Doculax, Miralax and Linzess. He can take Protonix for possible irritation.  -He continues to f/u with Dietician  -He has little to no appetite but makes sure he eats small 3 meals and Protein shakes. He will drink moderately with each meal. -His weight is stable lately. If 7.580mMirtazapine is not enough, I can increase dose for him -His performance status is currently fair and adequate. He takes care of himself, does limited hours at work and able to ambulate well.   3.History of acute cholecystitis -He washospitalizedon 05/03/19.  -He had a percutaneous gallbladder drainage tube placed by IR8/5/20.It was removed on 9/8 due to leakage   4.Right-sided pleural effusion -He underwent right thoracentesis with 2.1 is removedon 06/23/2019, cytologynegative.The etiology is unclear, certainly malignancy is ahigh possibility. Hewas treated withcourse of antibiotics -His SOB hadprogressed and he has worsened right pleural effusion on 10/20/19 CT scan. -He had a Thoracentesis on 10/29/19 with 1.3L removed. Cytology showed reactive mesothelial cells, no malignancy.PET from 11/17/19 is not definitve for metastasis but still suspected, will monitor. -His breathingand SOBis stable.   5. Comorbidities: CAD, HL, HTN, DM, CKD stage III -On amlodipine, atenolol, lisinopril, and statin. Continue tofollow up withcardiologist Dr. HaSherryle LisCP Dr CoTheda Sers 6. Depression, Social Support, Goal of Care Discussion with DNR/DNI -He notes he has minimal social support. He notes he has been feeling lonely and depressed lately being home alone.  -He has few remaining family members, friends, church members that are still living. He does feel like coming to our clinic and going to work allows him to see people, which he enjoys.  -He continues to f/u with our SW.  -He notes he would like to still come to out clinic where there are people to talk to.  -As he does not plan to do more treatment, he  understands his possible prognosis.  -I recommend DNR/DNI. He agreed -He has Living will and POA set up and gave up copy of paper work today (05/19/20). -He notes he is not ready for Hospice care at this time.    PLAN: -Patient declined palliative radiation or more chemotherapy  -Continue supportive care.  Lab and f/u in 3-4 weeks    No problem-specific Assessment & Plan notes found for this encounter.   No orders of the defined types were placed in this encounter.  All questions were  answered. The patient knows to call the clinic with any problems, questions or concerns. No barriers to learning was detected. The total time spent in the appointment was 30 minutes.     [name], MD 05/19/2020   I, Joslyn Devon, am acting as scribe for [name], MD.   I have reviewed the above documentation for accuracy and completeness, and I agree with the above.

## 2020-05-19 ENCOUNTER — Encounter: Payer: Self-pay | Admitting: Hematology

## 2020-05-19 ENCOUNTER — Inpatient Hospital Stay: Payer: Medicare Other | Attending: Hematology

## 2020-05-19 ENCOUNTER — Telehealth: Payer: Self-pay | Admitting: Hematology

## 2020-05-19 ENCOUNTER — Inpatient Hospital Stay: Payer: Medicare Other

## 2020-05-19 ENCOUNTER — Inpatient Hospital Stay (HOSPITAL_BASED_OUTPATIENT_CLINIC_OR_DEPARTMENT_OTHER): Payer: Medicare Other | Admitting: Hematology

## 2020-05-19 ENCOUNTER — Other Ambulatory Visit: Payer: Self-pay

## 2020-05-19 VITALS — BP 121/68 | HR 72 | Temp 97.7°F | Resp 18 | Ht 67.0 in | Wt 146.3 lb

## 2020-05-19 DIAGNOSIS — Z95828 Presence of other vascular implants and grafts: Secondary | ICD-10-CM

## 2020-05-19 DIAGNOSIS — I129 Hypertensive chronic kidney disease with stage 1 through stage 4 chronic kidney disease, or unspecified chronic kidney disease: Secondary | ICD-10-CM | POA: Insufficient documentation

## 2020-05-19 DIAGNOSIS — Z8719 Personal history of other diseases of the digestive system: Secondary | ICD-10-CM | POA: Diagnosis not present

## 2020-05-19 DIAGNOSIS — R634 Abnormal weight loss: Secondary | ICD-10-CM | POA: Insufficient documentation

## 2020-05-19 DIAGNOSIS — F329 Major depressive disorder, single episode, unspecified: Secondary | ICD-10-CM | POA: Diagnosis not present

## 2020-05-19 DIAGNOSIS — E1022 Type 1 diabetes mellitus with diabetic chronic kidney disease: Secondary | ICD-10-CM | POA: Diagnosis not present

## 2020-05-19 DIAGNOSIS — R109 Unspecified abdominal pain: Secondary | ICD-10-CM | POA: Insufficient documentation

## 2020-05-19 DIAGNOSIS — C221 Intrahepatic bile duct carcinoma: Secondary | ICD-10-CM

## 2020-05-19 DIAGNOSIS — N183 Chronic kidney disease, stage 3 unspecified: Secondary | ICD-10-CM | POA: Insufficient documentation

## 2020-05-19 DIAGNOSIS — C240 Malignant neoplasm of extrahepatic bile duct: Secondary | ICD-10-CM | POA: Insufficient documentation

## 2020-05-19 DIAGNOSIS — Z794 Long term (current) use of insulin: Secondary | ICD-10-CM | POA: Insufficient documentation

## 2020-05-19 DIAGNOSIS — I2510 Atherosclerotic heart disease of native coronary artery without angina pectoris: Secondary | ICD-10-CM | POA: Diagnosis not present

## 2020-05-19 DIAGNOSIS — E1040 Type 1 diabetes mellitus with diabetic neuropathy, unspecified: Secondary | ICD-10-CM | POA: Diagnosis not present

## 2020-05-19 LAB — CBC WITH DIFFERENTIAL (CANCER CENTER ONLY)
Abs Immature Granulocytes: 0.01 10*3/uL (ref 0.00–0.07)
Basophils Absolute: 0 10*3/uL (ref 0.0–0.1)
Basophils Relative: 1 %
Eosinophils Absolute: 0.2 10*3/uL (ref 0.0–0.5)
Eosinophils Relative: 3 %
HCT: 29.1 % — ABNORMAL LOW (ref 39.0–52.0)
Hemoglobin: 9.6 g/dL — ABNORMAL LOW (ref 13.0–17.0)
Immature Granulocytes: 0 %
Lymphocytes Relative: 33 %
Lymphs Abs: 2.2 10*3/uL (ref 0.7–4.0)
MCH: 28.6 pg (ref 26.0–34.0)
MCHC: 33 g/dL (ref 30.0–36.0)
MCV: 86.6 fL (ref 80.0–100.0)
Monocytes Absolute: 0.9 10*3/uL (ref 0.1–1.0)
Monocytes Relative: 13 %
Neutro Abs: 3.3 10*3/uL (ref 1.7–7.7)
Neutrophils Relative %: 50 %
Platelet Count: 183 10*3/uL (ref 150–400)
RBC: 3.36 MIL/uL — ABNORMAL LOW (ref 4.22–5.81)
RDW: 14.5 % (ref 11.5–15.5)
WBC Count: 6.6 10*3/uL (ref 4.0–10.5)
nRBC: 0 % (ref 0.0–0.2)

## 2020-05-19 LAB — CMP (CANCER CENTER ONLY)
ALT: 59 U/L — ABNORMAL HIGH (ref 0–44)
AST: 54 U/L — ABNORMAL HIGH (ref 15–41)
Albumin: 3.4 g/dL — ABNORMAL LOW (ref 3.5–5.0)
Alkaline Phosphatase: 138 U/L — ABNORMAL HIGH (ref 38–126)
Anion gap: 8 (ref 5–15)
BUN: 26 mg/dL — ABNORMAL HIGH (ref 8–23)
CO2: 25 mmol/L (ref 22–32)
Calcium: 10.3 mg/dL (ref 8.9–10.3)
Chloride: 105 mmol/L (ref 98–111)
Creatinine: 1.41 mg/dL — ABNORMAL HIGH (ref 0.61–1.24)
GFR, Est AFR Am: 57 mL/min — ABNORMAL LOW (ref >60)
GFR, Estimated: 49 mL/min — ABNORMAL LOW (ref >60)
Glucose, Bld: 108 mg/dL — ABNORMAL HIGH (ref 70–99)
Potassium: 3.9 mmol/L (ref 3.5–5.1)
Sodium: 138 mmol/L (ref 135–145)
Total Bilirubin: 1 mg/dL (ref 0.3–1.2)
Total Protein: 7.5 g/dL (ref 6.5–8.1)

## 2020-05-19 MED ORDER — HEPARIN SOD (PORK) LOCK FLUSH 100 UNIT/ML IV SOLN
500.0000 [IU] | Freq: Once | INTRAVENOUS | Status: AC | PRN
  Administered 2020-05-19: 500 [IU]
  Filled 2020-05-19: qty 5

## 2020-05-19 MED ORDER — SODIUM CHLORIDE 0.9% FLUSH
10.0000 mL | INTRAVENOUS | Status: DC | PRN
  Administered 2020-05-19: 10 mL
  Filled 2020-05-19: qty 10

## 2020-05-19 NOTE — Telephone Encounter (Signed)
Scheduled per 8/20 los. Pt prefers tues-wednesdays for appts and request YF. Pt is aware of appt time and date. No AVS or calendar needed.

## 2020-05-31 ENCOUNTER — Encounter: Payer: Self-pay | Admitting: Hematology

## 2020-05-31 ENCOUNTER — Other Ambulatory Visit: Payer: Self-pay | Admitting: Nurse Practitioner

## 2020-05-31 DIAGNOSIS — C221 Intrahepatic bile duct carcinoma: Secondary | ICD-10-CM

## 2020-06-01 ENCOUNTER — Encounter: Payer: Self-pay | Admitting: General Practice

## 2020-06-01 ENCOUNTER — Other Ambulatory Visit: Payer: Self-pay

## 2020-06-01 ENCOUNTER — Inpatient Hospital Stay: Payer: Medicare Other | Attending: Hematology

## 2020-06-01 VITALS — BP 111/66 | HR 70 | Temp 98.9°F | Resp 18 | Ht 67.0 in | Wt 146.5 lb

## 2020-06-01 DIAGNOSIS — I129 Hypertensive chronic kidney disease with stage 1 through stage 4 chronic kidney disease, or unspecified chronic kidney disease: Secondary | ICD-10-CM | POA: Insufficient documentation

## 2020-06-01 DIAGNOSIS — Z8719 Personal history of other diseases of the digestive system: Secondary | ICD-10-CM | POA: Diagnosis not present

## 2020-06-01 DIAGNOSIS — Z794 Long term (current) use of insulin: Secondary | ICD-10-CM | POA: Insufficient documentation

## 2020-06-01 DIAGNOSIS — C240 Malignant neoplasm of extrahepatic bile duct: Secondary | ICD-10-CM | POA: Insufficient documentation

## 2020-06-01 DIAGNOSIS — R634 Abnormal weight loss: Secondary | ICD-10-CM | POA: Insufficient documentation

## 2020-06-01 DIAGNOSIS — R0609 Other forms of dyspnea: Secondary | ICD-10-CM | POA: Insufficient documentation

## 2020-06-01 DIAGNOSIS — F329 Major depressive disorder, single episode, unspecified: Secondary | ICD-10-CM | POA: Insufficient documentation

## 2020-06-01 DIAGNOSIS — I2510 Atherosclerotic heart disease of native coronary artery without angina pectoris: Secondary | ICD-10-CM | POA: Insufficient documentation

## 2020-06-01 DIAGNOSIS — J90 Pleural effusion, not elsewhere classified: Secondary | ICD-10-CM | POA: Insufficient documentation

## 2020-06-01 DIAGNOSIS — C221 Intrahepatic bile duct carcinoma: Secondary | ICD-10-CM

## 2020-06-01 DIAGNOSIS — R109 Unspecified abdominal pain: Secondary | ICD-10-CM | POA: Insufficient documentation

## 2020-06-01 DIAGNOSIS — N183 Chronic kidney disease, stage 3 unspecified: Secondary | ICD-10-CM | POA: Diagnosis not present

## 2020-06-01 DIAGNOSIS — E1022 Type 1 diabetes mellitus with diabetic chronic kidney disease: Secondary | ICD-10-CM | POA: Diagnosis not present

## 2020-06-01 DIAGNOSIS — Z95828 Presence of other vascular implants and grafts: Secondary | ICD-10-CM

## 2020-06-01 MED ORDER — SODIUM CHLORIDE 0.9% FLUSH
10.0000 mL | INTRAVENOUS | Status: DC | PRN
  Administered 2020-06-01: 10 mL
  Filled 2020-06-01: qty 10

## 2020-06-01 MED ORDER — HEPARIN SOD (PORK) LOCK FLUSH 100 UNIT/ML IV SOLN
500.0000 [IU] | Freq: Once | INTRAVENOUS | Status: AC | PRN
  Administered 2020-06-01: 500 [IU]
  Filled 2020-06-01: qty 5

## 2020-06-01 MED ORDER — SODIUM CHLORIDE 0.9 % IV SOLN
Freq: Once | INTRAVENOUS | Status: AC
  Filled 2020-06-01: qty 250

## 2020-06-01 NOTE — Progress Notes (Signed)
[place] Spiritual Care Note  Reached Christopher Welch by phone for pastoral check-in, but he is waiting for another call from [facility] re appt for fluids. We plan to follow up by phone today or next week.   [place], [place], [facility] Pager [phone] Voicemail [phone]

## 2020-06-01 NOTE — Patient Instructions (Signed)

## 2020-06-05 ENCOUNTER — Other Ambulatory Visit: Payer: Self-pay | Admitting: Hematology

## 2020-06-06 ENCOUNTER — Encounter: Payer: Self-pay | Admitting: General Practice

## 2020-06-06 ENCOUNTER — Other Ambulatory Visit: Payer: Self-pay

## 2020-06-06 NOTE — Progress Notes (Signed)
[facility] Spiritual Care Note  Followed up with Christopher Welch by phone. He is noticing an improvement in how he feels (especially an increase in energy) since stopping chemo, which he is using to do simple activities that promote his quality of life. He is also grieving the passing of his elderly aunt last week. Provided spiritual, social, and emotional support. I hope to catch him after his appointment with Dr Burr Medico on 9/16 so that we can meet in person.   [place], [place], [facility] Pager [phone] Voicemail [phone]

## 2020-06-06 NOTE — Telephone Encounter (Signed)
Remeron refilled as requested

## 2020-06-08 ENCOUNTER — Encounter: Payer: Self-pay | Admitting: Hematology

## 2020-06-09 ENCOUNTER — Other Ambulatory Visit: Payer: Self-pay | Admitting: Hematology

## 2020-06-09 MED ORDER — OXYCODONE HCL 5 MG PO CAPS: 5.0000 mg | ORAL_CAPSULE | Freq: Four times a day (QID) | ORAL | 0 refills | Status: DC | PRN

## 2020-06-12 NOTE — Progress Notes (Signed)
[facility]   Telephone:([phone] Fax:([phone]   Clinic Follow up Note   Patient Care Team: Irena Reichmann, DO as PCP - General (Family Medicine) Malachy Mood, MD as Consulting Physician (Hematology) Dorothy Puffer, MD as Consulting Physician (Radiation Oncology) Ronny Bacon, PA-C as Physician Assistant (Radiation Oncology)  Date of Service:  06/15/2020  CHIEF COMPLAINT: F/u of ExtrahepaticCholangiocarcinoma  SUMMARY OF ONCOLOGIC HISTORY: Oncology History Overview Note  Cancer Staging Cholangiocarcinoma [facility]) Staging form: Perihilar Bile Ducts, AJCC 8th Edition - Clinical: Stage Unknown (cTX, cN0, cM0) - Signed by Malachy Mood, MD on 03/19/2019    Cholangiocarcinoma (HCC)  01/14/2019 Imaging   US Abdomen 01/14/19  IMPRESSION: There is significant intrahepatic and extrahepatic biliary dilatation present, concerning for distal common bile duct obstruction. Further evaluation with CT scan or MRCP is recommended. Correlation with liver function tests is recommended as well. These results will be called to the ordering clinician or representative by the Radiologist Assistant, and communication documented in the PACS or zVision Dashboard.   Probable large amount of sludge seen within gallbladder lumen with mild gallbladder wall thickening. However, no cholelithiasis, pericholecystic fluid or sonographic Murphy's sign is noted.   4.2 cm septated cyst seen in upper pole of right kidney consistent with Bosniak type 2 lesion. Follow-up ultrasound in 1 year is recommended to ensure stability.   01/20/2019 Imaging   MRI abdomen 01/20/19  IMPRESSION: 1. Findings are highly concerning for central tumor in the biliary tract at the confluence of the common hepatic duct, cystic duct and common bile ducts. Further clinical evaluation for potential cholangiocarcinoma is strongly recommended. 2. Mild ductal dilatation of the main pancreatic duct throughout  the distal body and tail of the pancreas where there is also some associated side branch ectasia. This may suggest a pancreatic ductal stricture. No obstructing pancreatic neoplasm identified. 3. Aortic atherosclerosis.   01/21/2019 Initial Biopsy   Diagnosis 01/21/19  BILE DUCT BRUSHING (SPECIMEN 1 OF 1 COLLECTED 01/21/2019) ADENOCARCINOMA.   01/21/2019 Procedure   ERCP By Dr hung 01/21/19 IMRPESSION - The major papilla appeared normal. - A single localized biliary stricture was found in the middle third of the main bile duct. - The entire main bile duct and upper third of the main bile duct were dilated, secondary to a stricture. - A biliary sphincterotomy was performed. - Cells for cytology obtained in the middle third of the main bile duct. - One plastic stent was placed into the common bile duct.  EUS by Dr hung 01/21/19  IMPRESSION - There was dilation in the middle third of the main bile duct and in the upper third of the main bile duct which measured up to 15 mm. - There was a suggestion of a stricture in the middle third of the main bile duct. - No specimens collected.   02/03/2019 Imaging   CT CAP [facility] 02/03/19  Impression:  1. There is mild intrahepatic and extrahepatic biliary ductal dilation and diffuse common bile duct wall thickening with stent in place. There is abnormal soft tissue measuring approximately 1.6 cm between the common hepatic artery, portal vein and common bile duct, worrisome for the known cholangiocarcinoma.  2. Periportal lymphadenopathy which may represent nodal metastasis.  4. Marked pancreatic atrophy and duct dilation involving the distal body and tail the pancreas where there is a coarse calcification. These findings are favored to represent stenosis from intraductal stones though an underlying stricture cannot be completely excluded.  5. Atypical symmetric prominent fat  stranding of the bilateral lower abdominal wall. Correlate with  surgical history or history of history of trauma.   03/08/2019 Initial Diagnosis   Cholangiocarcinoma ([place])   03/12/2019 - 04/14/2020 Chemotherapy   Gemcitabine and cisplatin on days 1 and 8 every 21 days starting 03/12/19. Abraxane added with cycle 2. Added GCSF ([name]) to day 9 starting cycle 2.  Abraxane stopped after C4 and chemo held 06/03/19-07/02/19 due to poor toleration/hospitalization. Cisplatin removed starting from cycle 13 due to renal insufficiency. Single agent Gemcitabine held since 04/14/20 to proceed with Target Radiation and chemo break, due to possible disease progression.    03/19/2019 Cancer Staging   Staging form: Perihilar Bile Ducts, AJCC 8th Edition - Clinical: Stage Unknown (cTX, cN0, cM0) - Signed by [name], MD on 03/19/2019   05/24/2019 Imaging   CT CAP W Contrast at [facility]  1.  Interval placement of covered metallic biliary stent. No progressive dilation of the biliary tree and resulting resolution of the prior main pancreatic duct dilation. 2.  Evidence of some subtle vascular contour deformity involving the hepatic artery, suspected to be from tumoral contact and/or posttreatment changes. 3.  New small right pleural effusion with overlying mild airspace disease. Secondary findings which may indicate a component of aspiration. 4.  No convincing evidence of new disseminated metastatic disease. 5.  Additional findings as discussed above.   06/18/2019 Imaging   CT AP IMPRESSION: 1.  No acute findings in the abdomen/pelvis. 2. Biliary stent in adequate position. No evidence of focal mass or adenopathy in the region of the pancreatic head or porta hepatis. 3. Moderate size right pleural effusion with associated right basilar atelectasis. 4. Possible single gallstone. Stable subcentimeter liver cyst. Stable right renal cysts. Small left inguinal hernia containing only peritoneal fat. 5. Aortic Atherosclerosis (ICD10-I70.0).   07/28/2019 Imaging   CT CAP  IMPRESSION: 1. The patient is status post common bile duct stent placement, with unchanged stent position and patent appearance with pneumobilia. Primary cholangiocarcinoma is not discretely appreciated by CT. 2. No direct evidence of metastatic disease in the chest, abdomen, or pelvis. 3. Moderate right pleural effusion with associated atelectasis or consolidation, slightly improved compared to prior examination. 4. The pancreatic parenchyma is atrophic and calcified, particularly in the pancreatic tail. 5.  Coronary artery disease.  Aortic atherosclerosis   10/20/2019 PET scan   IMPRESSION: 1. Redemonstrated post treatment and post stenting appearance of a central cholangiocarcinoma, which is not directly appreciated by CT. Common bile duct stent remains in position with post stenting pneumobilia. 2. Slight interval increase in a moderate to large right pleural effusion with associated atelectasis or consolidation. There may be some pleural nodularity about the right hemidiaphragm which appears new compared to prior examination (series 2, image 40), highly suspicious for pleural metastatic disease. 3. No direct evidence of metastatic disease in the chest, abdomen, or pelvis. 4. Coronary artery disease.  Aortic Atherosclerosis (ICD10-I70.0).     10/29/2019 Pathology Results   Thoracentesis FINAL MICROSCOPIC DIAGNOSIS:  - Reactive mesothelial cells present    11/17/2019 PET scan   IMPRESSION: 1. Low-level hypermetabolism within the right pleural space, without focal abnormality to strongly suggest pleural metastasis. 2. Hypermetabolism along the course of the common duct stent, nonspecific and most likely reactive. 3. Otherwise, no evidence of hypermetabolic metastasis. 4. Small to moderate right pleural effusion with developing loculation superiorly. 5. Coronary artery atherosclerosis. Aortic Atherosclerosis (ICD10-I70.0). 6.  Possible constipation.     02/03/2020 Imaging    CT CAP IMPRESSION:  1. Stable indistinct circumferential biliary wall thickening in the proximal CBD, without discrete biliary mass. Stable well-positioned CBD stent with intrahepatic pneumobilia indicating patency. 2. No findings of metastatic disease in the abdomen or pelvis. 3. Small dependent right pleural effusion is decreased. Mild diffuse smooth right pleural thickening is similar to slightly increased, nonspecific, cannot exclude malignant pleural effusion. No discrete pleural nodularity. 4. Tiny clustered tree-in-bud type nodules in the anterior left upper lobe, slightly increased, nonspecific, more likely inflammatory. Recommend attention on follow-up chest CT in 3 months. 5. Chronic findings include: Chronic pancreatitis. Coronary atherosclerosis. Aortic Atherosclerosis (ICD10-I70.0).     02/21/2020 Imaging   US Abdomen  IMPRESSION: Mildly distended gallbladder without visible wall thickening or stones.   Common bile duct stent in place. Slight prominence of intrahepatic biliary ducts in the left hepatic lobe.   Increased echotexture of the liver suggesting fatty infiltration.   04/12/2020 Imaging   CT CAP W contrast  IMPRESSION: 1. Enlarging periportal lymph nodes and new duodenal wall thickening are worrisome for disease progression. An infectious or inflammatory process is not excluded. Soft tissue thickening surrounding the lower common bile duct with wall stent in place, stable. 2. Chronic-appearing moderate right pleural effusion, stable. 3. Stable pulmonary nodularity. Continued attention on follow-up is recommended. 4. Fair amount of stool in the colon is indicative of constipation. 5. Chronic pancreatitis. 6. Aortic atherosclerosis (ICD10-I70.0). Coronary artery calcification.      CURRENT THERAPY:  Supportive Care   INTERVAL HISTORY:  Christopher Welch is here for a follow up. He presents to the clinic alone.  He is clinically stable, his epigastric  pain is controlled by oxycodone 3 times a day.  His energy level has been slightly better lately, he still works 4 hours a week, does house chores, shopping without difficulties.  He lives by himself in a condo, and his friend Jimmye Norman visits and stays with him overnight a few days a week.  Is mild dyspnea on exertion is stable, no cough or chest pain.  His weight has been stable, no other new complaints.  Review of systems otherwise negative.   MEDICAL HISTORY:  Past Medical History:  Diagnosis Date  . Diabetes mellitus type I, controlled ([place])   . Dyslipidemia   . ED (erectile dysfunction)   . Essential hypertension   . Gilbert's disease   . Hepatitis B carrier [facility])     SURGICAL HISTORY: Past Surgical History:  Procedure Laterality Date  . APPENDECTOMY  1964  . BILIARY BRUSHING  01/21/2019   Procedure: BILIARY BRUSHING;  Surgeon: Carol Ada, MD;  Location: [facility];  Service: Endoscopy;;  . BILIARY STENT PLACEMENT N/A 01/21/2019   Procedure: BILIARY STENT PLACEMENT;  Surgeon: Carol Ada, MD;  Location: [facility];  Service: Endoscopy;  Laterality: N/A;  . ENDOSCOPIC RETROGRADE CHOLANGIOPANCREATOGRAPHY (ERCP) WITH PROPOFOL N/A 01/21/2019   Procedure: ENDOSCOPIC RETROGRADE CHOLANGIOPANCREATOGRAPHY (ERCP) WITH PROPOFOL;  Surgeon: Carol Ada, MD;  Location: [facility];  Service: Endoscopy;  Laterality: N/A;  . ESOPHAGOGASTRODUODENOSCOPY (EGD) WITH PROPOFOL N/A 01/21/2019   Procedure: ESOPHAGOGASTRODUODENOSCOPY (EGD) WITH PROPOFOL;  Surgeon: Carol Ada, MD;  Location: [facility];  Service: Endoscopy;  Laterality: N/A;  . FEMORAL HERNIA REPAIR    . IR CHOLANGIOGRAM EXISTING TUBE  05/13/2019  . IR CHOLANGIOGRAM EXISTING TUBE  06/08/2019  . IR EXCHANGE BILIARY DRAIN  05/18/2019  . IR EXCHANGE BILIARY DRAIN  06/08/2019  . IR IMAGING GUIDED PORT INSERTION  03/08/2019  . IR PERC CHOLECYSTOSTOMY  05/04/2019  . IR THORACENTESIS  ASP PLEURAL SPACE W/IMG GUIDE  10/29/2019  . LEFT HEART  CATH AND CORONARY ANGIOGRAPHY N/A 05/19/2017   Procedure: LEFT HEART CATH AND CORONARY ANGIOGRAPHY;  Surgeon: Leonie Man, MD;  Location: [facility];  Service: Cardiovascular;  Laterality: N/A;  . SPHINCTEROTOMY  01/21/2019   Procedure: SPHINCTEROTOMY;  Surgeon: Carol Ada, MD;  Location: [facility];  Service: Endoscopy;;  . UPPER ESOPHAGEAL ENDOSCOPIC ULTRASOUND (EUS) N/A 01/21/2019   Procedure: UPPER ESOPHAGEAL ENDOSCOPIC ULTRASOUND (EUS);  Surgeon: Carol Ada, MD;  Location: [facility];  Service: Endoscopy;  Laterality: N/A;    I have reviewed the social history and family history with the patient and they are unchanged from previous note.  ALLERGIES:  is allergic to erythromycin.  MEDICATIONS:  Current Outpatient Medications  Medication Sig Dispense Refill  . amLODipine (NORVASC) 5 MG tablet Take 5 mg by mouth daily.     Marland Kitchen atenolol (TENORMIN) 25 MG tablet Take 25 mg by mouth daily. Once a day     . atorvastatin (LIPITOR) 10 MG tablet Take 10 mg by mouth daily.    . Blood Glucose Monitoring Suppl (ACCU-CHEK NANO SMARTVIEW) W/DEVICE KIT 1 kit by Does not apply route 2 (two) times daily. (Patient taking differently: 1 kit by Does not apply route See admin instructions. Test blood sugars 12x's daily) 1 kit 0  . clonazePAM (KLONOPIN) 0.5 MG tablet     . glucose blood test strip Test 3 times a day. (Patient taking differently: 1 each by Other route See admin instructions. Test 12 times a day.) 300 each Prn  . insulin NPH Human (NOVOLIN N) 100 UNIT/ML injection Inject 20 Units into the skin at bedtime.     . insulin regular (NOVOLIN R) 100 units/mL injection Inject 20 Units into the skin 3 (three) times daily before meals.     . lidocaine-prilocaine (EMLA) cream Apply to affected area once 30 g 3  . linaclotide (LINZESS) 145 MCG CAPS capsule Take 1 capsule (145 mcg total) by mouth daily before breakfast. (Patient not taking: Reported on 05/09/2020) 30 capsule 2  . lisinopril  (ZESTRIL) 20 MG tablet Take 20 mg by mouth daily.     . magnesium oxide (MAG-OX) 400 MG tablet TAKE 1 TABLET BY MOUTH EVERY DAY 90 tablet 1  . mirtazapine (REMERON) 7.5 MG tablet TAKE 1 TABLET (7.5 MG TOTAL) BY MOUTH AT BEDTIME. 90 tablet 1  . ondansetron (ZOFRAN) 8 MG tablet TAKE 1 TABLET BY MOUTH 2 TIMES DAY AS NEEDED. START ON 3RD DAY AFTER CHEMOTHERAPY 30 tablet 1  . oxycodone (OXY-IR) 5 MG capsule Take 1 capsule (5 mg total) by mouth every 6 (six) hours as needed for pain (severe pain). 90 capsule 0  . pantoprazole (PROTONIX) 40 MG tablet TAKE 1 TABLET BY MOUTH EVERY DAY 90 tablet 1  . prochlorperazine (COMPAZINE) 10 MG tablet TAKE 1 TABLET (10 MG TOTAL) BY MOUTH EVERY 6 (SIX) HOURS AS NEEDED (NAUSEA OR VOMITING). 30 tablet 1  . zolpidem (AMBIEN) 5 MG tablet Take 1-2 tablets (5-10 mg total) by mouth at bedtime as needed for sleep. 60 tablet 0   No current facility-administered medications for this visit.   Facility-Administered Medications Ordered in Other Visits  Medication Dose Route Frequency Provider Last Rate Last Admin  . 0.9 %  sodium chloride infusion (Manually program via Guardrails IV Fluids)  250 mL Intravenous Once [name], MD        PHYSICAL EXAMINATION: ECOG PERFORMANCE STATUS: 2 - Symptomatic, <50% confined  to bed  Vitals:   06/15/20 0821  BP: 122/65  Pulse: 76  Resp: 18  Temp: 97.7 F (36.5 C)  SpO2: 100%   Filed Weights   06/15/20 0821  Weight: 147 lb 14.4 oz (67.1 kg)    GENERAL:alert, no distress and comfortable SKIN: skin color, texture, turgor are normal, no rashes or significant lesions EYES: normal, Conjunctiva are pink and non-injected, sclera clear NECK: supple, thyroid normal size, non-tender, without nodularity LYMPH:  no palpable lymphadenopathy in the cervical, axillary  LUNGS: clear to auscultation and percussion with normal breathing effort HEART: regular rate & rhythm and no murmurs and no lower extremity edema ABDOMEN:abdomen soft,  non-tender and normal bowel sounds Musculoskeletal:no cyanosis of digits and no clubbing  NEURO: alert & oriented x 3 with fluent speech, no focal motor/sensory deficits  LABORATORY DATA:  I have reviewed the data as listed CBC Latest Ref Rng & Units 05/19/2020 04/28/2020 04/07/2020  WBC 4.0 - 10.5 K/uL 6.6 8.5 7.4  Hemoglobin 13.0 - 17.0 g/dL 9.6(L) 9.9(L) 9.2(L)  Hematocrit 39 - 52 % 29.1(L) 29.6(L) 28.5(L)  Platelets 150 - 400 K/uL 183 204 270     CMP Latest Ref Rng & Units 05/19/2020 04/28/2020 04/07/2020  Glucose 70 - 99 mg/dL 108(H) 147(H) 271(H)  BUN 8 - 23 mg/dL 26(H) 25(H) 27(H)  Creatinine 0.61 - 1.24 mg/dL 1.41(H) 1.55(H) 1.79(H)  Sodium 135 - 145 mmol/L 138 138 137  Potassium 3.5 - 5.1 mmol/L 3.9 4.0 4.5  Chloride 98 - 111 mmol/L 105 105 103  CO2 22 - 32 mmol/L $RemoveB'25 23 24  'MkkItTbd$ Calcium 8.9 - 10.3 mg/dL 10.3 10.4(H) 9.7  Total Protein 6.5 - 8.1 g/dL 7.5 7.7 7.6  Total Bilirubin 0.3 - 1.2 mg/dL 1.0 0.5 0.6  Alkaline Phos 38 - 126 U/L 138(H) 113 62  AST 15 - 41 U/L 54(H) 25 21  ALT 0 - 44 U/L 59(H) 36 17      RADIOGRAPHIC STUDIES: I have personally reviewed the radiological images as listed and agreed with the findings in the report. No results found.   ASSESSMENT & PLAN:  CHARLETON DEYOUNG is a 74 y.o. male with    1. Extrahepatic cholangiocarcinoma, cTxN0Mx -He wasdiagnosedin 12/2018. With extrahepatic adenocarcinoma. His extrahepatic cholangiocarcinoma was not resectable due to vascular invasion(surgeon - Dr. Zenia Resides at [facility]) -I initially started him with first line Gemcitabine, Cisplatin, Abraxane in 02/2020. Due to poor tolerance this was reduced to Single agent Gemcitabine after cycle 13 and ultimately stopped since 04/14/20 to proceed with chemo break. -Dr Carlis Abbott did not recommend surgery given his decreased performance statusand technically difficult resection.  -HisCT CAP from 04/12/20 showedEnlarging periportal lymph nodes and new duodenal wall thickening are worrisome  for disease progressionor Inflammation. He declined biopsy and target radiation to tumor.  -Patient at this point is very clear that he is not interested in prolong his life, he is much more valued his quality of life. After lengthy discussion, he declined further chemo and agreed to proceed with supportive care alone.  -He is clinically stable and doing well overall, continue supportive care. -We again reviewed the logistics of hospice, he is open to it, but does not want to enroll now.  He still functions well at home. -f/u in a month     2.Supportive Care: Abdominal pain, Low appetite, Weight loss  -Secondary to #1 -For pain he is on Oxycodone $RemoveBefo'5mg'cjQrtNiitFv$  3 tabs a day.  -To avoid constipation he has Doculax, Miralax and Linzess. He  can take Protonix forpossible irritation.  -Continue mirtazapine  -Weight has been stable lately.   4.Right-sided pleural effusion, Onset in 06/2019.The etiology is unclear -He has required Thoracentesis periodically. Cytologies have been negative for malignancy. -stable   5. Comorbidities: CAD, HL, HTN, DM, CKD stage III -On amlodipine, atenolol, lisinopril, and statin. Continue tofollow up withcardiologist Dr. Sherryle Lis PCP Dr Theda Sers.  6. Depression, Social Support, Goal of Care Discussion with DNR/DNI -He notes he has minimal social support. He has few remaining family members, friends, church members that are still living. He does feel like coming to our clinic and going to work allows him to see people, which he enjoys. He continues to f/u with our SW.  -I recommend DNR/DNI. He agreed. He has Living will and POA (his friend) set up. He notes he is not ready for Hospice care at this time.    PLAN: -Lab reviewed, continue supportive care -Lab and follow-up in 4 weeks   No problem-specific Assessment & Plan notes found for this encounter.   No orders of the defined types were placed in this encounter.  All questions were answered. The  patient knows to call the clinic with any problems, questions or concerns. No barriers to learning was detected. The total time spent in the appointment was 30 minutes.     [name], MD 06/15/2020   I, Joslyn Devon, am acting as scribe for [name], MD.   I have reviewed the above documentation for accuracy and completeness, and I agree with the above.

## 2020-06-15 ENCOUNTER — Other Ambulatory Visit: Payer: Self-pay

## 2020-06-15 ENCOUNTER — Encounter: Payer: Self-pay | Admitting: Hematology

## 2020-06-15 ENCOUNTER — Inpatient Hospital Stay: Payer: Medicare Other

## 2020-06-15 ENCOUNTER — Encounter: Payer: Self-pay | Admitting: General Practice

## 2020-06-15 ENCOUNTER — Inpatient Hospital Stay (HOSPITAL_BASED_OUTPATIENT_CLINIC_OR_DEPARTMENT_OTHER): Payer: Medicare Other | Admitting: Hematology

## 2020-06-15 ENCOUNTER — Telehealth: Payer: Self-pay | Admitting: Hematology

## 2020-06-15 VITALS — BP 122/65 | HR 76 | Temp 97.7°F | Resp 18 | Ht 67.0 in | Wt 147.9 lb

## 2020-06-15 DIAGNOSIS — R0609 Other forms of dyspnea: Secondary | ICD-10-CM | POA: Diagnosis not present

## 2020-06-15 DIAGNOSIS — J90 Pleural effusion, not elsewhere classified: Secondary | ICD-10-CM | POA: Diagnosis not present

## 2020-06-15 DIAGNOSIS — C221 Intrahepatic bile duct carcinoma: Secondary | ICD-10-CM

## 2020-06-15 DIAGNOSIS — R634 Abnormal weight loss: Secondary | ICD-10-CM | POA: Diagnosis not present

## 2020-06-15 DIAGNOSIS — Z95828 Presence of other vascular implants and grafts: Secondary | ICD-10-CM

## 2020-06-15 DIAGNOSIS — N183 Chronic kidney disease, stage 3 unspecified: Secondary | ICD-10-CM | POA: Diagnosis not present

## 2020-06-15 DIAGNOSIS — C240 Malignant neoplasm of extrahepatic bile duct: Secondary | ICD-10-CM | POA: Diagnosis not present

## 2020-06-15 DIAGNOSIS — R109 Unspecified abdominal pain: Secondary | ICD-10-CM | POA: Diagnosis not present

## 2020-06-15 LAB — CMP (CANCER CENTER ONLY)
ALT: 119 U/L — ABNORMAL HIGH (ref 0–44)
AST: 87 U/L — ABNORMAL HIGH (ref 15–41)
Albumin: 3.3 g/dL — ABNORMAL LOW (ref 3.5–5.0)
Alkaline Phosphatase: 229 U/L — ABNORMAL HIGH (ref 38–126)
Anion gap: 7 (ref 5–15)
BUN: 24 mg/dL — ABNORMAL HIGH (ref 8–23)
CO2: 26 mmol/L (ref 22–32)
Calcium: 9.7 mg/dL (ref 8.9–10.3)
Chloride: 104 mmol/L (ref 98–111)
Creatinine: 1.44 mg/dL — ABNORMAL HIGH (ref 0.61–1.24)
GFR, Est AFR Am: 56 mL/min — ABNORMAL LOW (ref >60)
GFR, Estimated: 48 mL/min — ABNORMAL LOW (ref >60)
Glucose, Bld: 132 mg/dL — ABNORMAL HIGH (ref 70–99)
Potassium: 4 mmol/L (ref 3.5–5.1)
Sodium: 137 mmol/L (ref 135–145)
Total Bilirubin: 0.9 mg/dL (ref 0.3–1.2)
Total Protein: 7.3 g/dL (ref 6.5–8.1)

## 2020-06-15 LAB — CBC WITH DIFFERENTIAL (CANCER CENTER ONLY)
Abs Immature Granulocytes: 0.02 10*3/uL (ref 0.00–0.07)
Basophils Absolute: 0 10*3/uL (ref 0.0–0.1)
Basophils Relative: 1 %
Eosinophils Absolute: 0.1 10*3/uL (ref 0.0–0.5)
Eosinophils Relative: 2 %
HCT: 28.5 % — ABNORMAL LOW (ref 39.0–52.0)
Hemoglobin: 9.2 g/dL — ABNORMAL LOW (ref 13.0–17.0)
Immature Granulocytes: 0 %
Lymphocytes Relative: 31 %
Lymphs Abs: 1.9 10*3/uL (ref 0.7–4.0)
MCH: 27.9 pg (ref 26.0–34.0)
MCHC: 32.3 g/dL (ref 30.0–36.0)
MCV: 86.4 fL (ref 80.0–100.0)
Monocytes Absolute: 0.9 10*3/uL (ref 0.1–1.0)
Monocytes Relative: 14 %
Neutro Abs: 3.2 10*3/uL (ref 1.7–7.7)
Neutrophils Relative %: 52 %
Platelet Count: 165 10*3/uL (ref 150–400)
RBC: 3.3 MIL/uL — ABNORMAL LOW (ref 4.22–5.81)
RDW: 14.9 % (ref 11.5–15.5)
WBC Count: 6.2 10*3/uL (ref 4.0–10.5)
nRBC: 0 % (ref 0.0–0.2)

## 2020-06-15 MED ORDER — SODIUM CHLORIDE 0.9% FLUSH
10.0000 mL | INTRAVENOUS | Status: DC | PRN
  Administered 2020-06-15: 10 mL
  Filled 2020-06-15: qty 10

## 2020-06-15 MED ORDER — HEPARIN SOD (PORK) LOCK FLUSH 100 UNIT/ML IV SOLN
500.0000 [IU] | Freq: Once | INTRAVENOUS | Status: AC | PRN
  Administered 2020-06-15: 500 [IU]
  Filled 2020-06-15: qty 5

## 2020-06-15 NOTE — Progress Notes (Signed)
[facility] Spiritual Care Note  Met Mr Fohl in person following appt with Dr Burr Medico, providing empathic listening and spiritual support as he shared and processed recent health updates. In particular, reflecting on his recent activities (chores, errands, walks, etc), he realized that he has been even busier than he realized, an indicator that he is indeed feeling better and more energetic than he was during chemo. He was pleased to notice that his activity level matches how he feels internally. We plan to connect by phone in the next two weeks for another pastoral check-in.   [place], [place], [facility] Pager [phone] Voicemail [phone]

## 2020-06-15 NOTE — Telephone Encounter (Signed)
Scheduled per 9/16 los. No avs or calendar needed to be printed. Pt is mychart active.

## 2020-07-11 NOTE — Progress Notes (Signed)
[place]   [phone] [phone]   Clinic Follow up Note   Patient Care Team: [name], DO as PCP - General (Family Medicine) [name], MD as Consulting Physician (Hematology) Kyung Rudd, MD as Consulting Physician (Radiation Oncology) Hayden Pedro, PA-C as Physician Assistant (Radiation Oncology)  Date of Service:  07/13/2020  CHIEF COMPLAINT: F/u of ExtrahepaticCholangiocarcinoma  SUMMARY OF ONCOLOGIC HISTORY: Oncology History Overview Note  Cancer Staging Cholangiocarcinoma [facility]) Staging form: Perihilar Bile Ducts, AJCC 8th Edition - Clinical: Stage Unknown (cTX, cN0, cM0) - Signed by [name], MD on 03/19/2019    Cholangiocarcinoma ([place])  01/14/2019 Imaging   US Abdomen 01/14/19  IMPRESSION: There is significant intrahepatic and extrahepatic biliary dilatation present, concerning for distal common bile duct obstruction. Further evaluation with CT scan or MRCP is recommended. Correlation with liver function tests is recommended as well. These results will be called to the ordering clinician or representative by the Radiologist Assistant, and communication documented in the PACS or zVision Dashboard.   Probable large amount of sludge seen within gallbladder lumen with mild gallbladder wall thickening. However, no cholelithiasis, pericholecystic fluid or sonographic Murphy's sign is noted.   4.2 cm septated cyst seen in upper pole of right kidney consistent with Bosniak type 2 lesion. Follow-up ultrasound in 1 year is recommended to ensure stability.   01/20/2019 Imaging   MRI abdomen 01/20/19  IMPRESSION: 1. Findings are highly concerning for central tumor in the biliary tract at the confluence of the common hepatic duct, cystic duct and common bile ducts. Further clinical evaluation for potential cholangiocarcinoma is strongly recommended. 2. Mild ductal dilatation of the main pancreatic duct throughout  the distal body and tail of the pancreas where there is also some associated side branch ectasia. This may suggest a pancreatic ductal stricture. No obstructing pancreatic neoplasm identified. 3. Aortic atherosclerosis.   01/21/2019 Initial Biopsy   Diagnosis 01/21/19  BILE DUCT BRUSHING (SPECIMEN 1 OF 1 COLLECTED 01/21/2019) ADENOCARCINOMA.   01/21/2019 Procedure   ERCP By Dr hung 01/21/19 IMRPESSION - The major papilla appeared normal. - A single localized biliary stricture was found in the middle third of the main bile duct. - The entire main bile duct and upper third of the main bile duct were dilated, secondary to a stricture. - A biliary sphincterotomy was performed. - Cells for cytology obtained in the middle third of the main bile duct. - One plastic stent was placed into the common bile duct.  EUS by Dr hung 01/21/19  IMPRESSION - There was dilation in the middle third of the main bile duct and in the upper third of the main bile duct which measured up to 15 mm. - There was a suggestion of a stricture in the middle third of the main bile duct. - No specimens collected.   02/03/2019 Imaging   CT CAP at [facility] 02/03/19  Impression:  1. There is mild intrahepatic and extrahepatic biliary ductal dilation and diffuse common bile duct wall thickening with stent in place. There is abnormal soft tissue measuring approximately 1.6 cm between the common hepatic artery, portal vein and common bile duct, worrisome for the known cholangiocarcinoma.  2. Periportal lymphadenopathy which may represent nodal metastasis.  4. Marked pancreatic atrophy and duct dilation involving the distal body and tail the pancreas where there is a coarse calcification. These findings are favored to represent stenosis from intraductal stones though an underlying stricture cannot be completely excluded.  5. Atypical symmetric prominent fat  stranding of the bilateral lower abdominal wall. Correlate with  surgical history or history of history of trauma.   03/08/2019 Initial Diagnosis   Cholangiocarcinoma ([place])   03/12/2019 - 04/14/2020 Chemotherapy   Gemcitabine and cisplatin on days 1 and 8 every 21 days starting 03/12/19. Abraxane added with cycle 2. Added GCSF ([name]) to day 9 starting cycle 2.  Abraxane stopped after C4 and chemo held 06/03/19-07/02/19 due to poor toleration/hospitalization. Cisplatin removed starting from cycle 13 due to renal insufficiency. Single agent Gemcitabine held since 04/14/20 to proceed with Target Radiation and chemo break, due to possible disease progression.    03/19/2019 Cancer Staging   Staging form: Perihilar Bile Ducts, AJCC 8th Edition - Clinical: Stage Unknown (cTX, cN0, cM0) - Signed by [name], MD on 03/19/2019   05/24/2019 Imaging   CT CAP W Contrast at [facility]  1.  Interval placement of covered metallic biliary stent. No progressive dilation of the biliary tree and resulting resolution of the prior main pancreatic duct dilation. 2.  Evidence of some subtle vascular contour deformity involving the hepatic artery, suspected to be from tumoral contact and/or posttreatment changes. 3.  New small right pleural effusion with overlying mild airspace disease. Secondary findings which may indicate a component of aspiration. 4.  No convincing evidence of new disseminated metastatic disease. 5.  Additional findings as discussed above.   06/18/2019 Imaging   CT AP IMPRESSION: 1.  No acute findings in the abdomen/pelvis. 2. Biliary stent in adequate position. No evidence of focal mass or adenopathy in the region of the pancreatic head or porta hepatis. 3. Moderate size right pleural effusion with associated right basilar atelectasis. 4. Possible single gallstone. Stable subcentimeter liver cyst. Stable right renal cysts. Small left inguinal hernia containing only peritoneal fat. 5. Aortic Atherosclerosis (ICD10-I70.0).   07/28/2019 Imaging   CT CAP  IMPRESSION: 1. The patient is status post common bile duct stent placement, with unchanged stent position and patent appearance with pneumobilia. Primary cholangiocarcinoma is not discretely appreciated by CT. 2. No direct evidence of metastatic disease in the chest, abdomen, or pelvis. 3. Moderate right pleural effusion with associated atelectasis or consolidation, slightly improved compared to prior examination. 4. The pancreatic parenchyma is atrophic and calcified, particularly in the pancreatic tail. 5.  Coronary artery disease.  Aortic atherosclerosis   10/20/2019 PET scan   IMPRESSION: 1. Redemonstrated post treatment and post stenting appearance of a central cholangiocarcinoma, which is not directly appreciated by CT. Common bile duct stent remains in position with post stenting pneumobilia. 2. Slight interval increase in a moderate to large right pleural effusion with associated atelectasis or consolidation. There may be some pleural nodularity about the right hemidiaphragm which appears new compared to prior examination (series 2, image 40), highly suspicious for pleural metastatic disease. 3. No direct evidence of metastatic disease in the chest, abdomen, or pelvis. 4. Coronary artery disease.  Aortic Atherosclerosis (ICD10-I70.0).     10/29/2019 Pathology Results   Thoracentesis FINAL MICROSCOPIC DIAGNOSIS:  - Reactive mesothelial cells present    11/17/2019 PET scan   IMPRESSION: 1. Low-level hypermetabolism within the right pleural space, without focal abnormality to strongly suggest pleural metastasis. 2. Hypermetabolism along the course of the common duct stent, nonspecific and most likely reactive. 3. Otherwise, no evidence of hypermetabolic metastasis. 4. Small to moderate right pleural effusion with developing loculation superiorly. 5. Coronary artery atherosclerosis. Aortic Atherosclerosis (ICD10-I70.0). 6.  Possible constipation.     02/03/2020 Imaging    CT CAP IMPRESSION:  1. Stable indistinct circumferential biliary wall thickening in the proximal CBD, without discrete biliary mass. Stable well-positioned CBD stent with intrahepatic pneumobilia indicating patency. 2. No findings of metastatic disease in the abdomen or pelvis. 3. Small dependent right pleural effusion is decreased. Mild diffuse smooth right pleural thickening is similar to slightly increased, nonspecific, cannot exclude malignant pleural effusion. No discrete pleural nodularity. 4. Tiny clustered tree-in-bud type nodules in the anterior left upper lobe, slightly increased, nonspecific, more likely inflammatory. Recommend attention on follow-up chest CT in 3 months. 5. Chronic findings include: Chronic pancreatitis. Coronary atherosclerosis. Aortic Atherosclerosis (ICD10-I70.0).     02/21/2020 Imaging   US Abdomen  IMPRESSION: Mildly distended gallbladder without visible wall thickening or stones.   Common bile duct stent in place. Slight prominence of intrahepatic biliary ducts in the left hepatic lobe.   Increased echotexture of the liver suggesting fatty infiltration.   04/12/2020 Imaging   CT CAP W contrast  IMPRESSION: 1. Enlarging periportal lymph nodes and new duodenal wall thickening are worrisome for disease progression. An infectious or inflammatory process is not excluded. Soft tissue thickening surrounding the lower common bile duct with wall stent in place, stable. 2. Chronic-appearing moderate right pleural effusion, stable. 3. Stable pulmonary nodularity. Continued attention on follow-up is recommended. 4. Fair amount of stool in the colon is indicative of constipation. 5. Chronic pancreatitis. 6. Aortic atherosclerosis (ICD10-I70.0). Coronary artery calcification.      CURRENT THERAPY:  Supportive Care  INTERVAL HISTORY:  Christopher Welch is here for a follow up. He presents to the clinic alone.  He has noticed worsening epigastric and  low back pain lately, increased oxycodone 1-2 tabs every 4-6 hours, average 7 tabs daily.  Pain overall is better controlled, which helped his appetite and energy. Appetite is fair, weight stable He is able to function well at home. No fever, chest discomfort or other new symptoms  All other systems were reviewed with the patient and are negative.  MEDICAL HISTORY:  Past Medical History:  Diagnosis Date  . Diabetes mellitus type I, controlled (HCC)   . Dyslipidemia   . ED (erectile dysfunction)   . Essential hypertension   . Gilbert's disease   . Hepatitis B carrier (HCC)     SURGICAL HISTORY: Past Surgical History:  Procedure Laterality Date  . APPENDECTOMY  1964  . BILIARY BRUSHING  01/21/2019   Procedure: BILIARY BRUSHING;  Surgeon: Hung, Patrick, MD;  Location: [facility];  Service: Endoscopy;;  . BILIARY STENT PLACEMENT N/A 01/21/2019   Procedure: BILIARY STENT PLACEMENT;  Surgeon: Hung, Patrick, MD;  Location: [facility];  Service: Endoscopy;  Laterality: N/A;  . ENDOSCOPIC RETROGRADE CHOLANGIOPANCREATOGRAPHY (ERCP) WITH PROPOFOL N/A 01/21/2019   Procedure: ENDOSCOPIC RETROGRADE CHOLANGIOPANCREATOGRAPHY (ERCP) WITH PROPOFOL;  Surgeon: Hung, Patrick, MD;  Location: [facility];  Service: Endoscopy;  Laterality: N/A;  . ESOPHAGOGASTRODUODENOSCOPY (EGD) WITH PROPOFOL N/A 01/21/2019   Procedure: ESOPHAGOGASTRODUODENOSCOPY (EGD) WITH PROPOFOL;  Surgeon: Hung, Patrick, MD;  Location: [facility];  Service: Endoscopy;  Laterality: N/A;  . FEMORAL HERNIA REPAIR    . IR CHOLANGIOGRAM EXISTING TUBE  05/13/2019  . IR CHOLANGIOGRAM EXISTING TUBE  06/08/2019  . IR EXCHANGE BILIARY DRAIN  05/18/2019  . IR EXCHANGE BILIARY DRAIN  06/08/2019  . IR IMAGING GUIDED PORT INSERTION  03/08/2019  . IR PERC CHOLECYSTOSTOMY  05/04/2019  . IR THORACENTESIS ASP PLEURAL SPACE W/IMG GUIDE  10/29/2019  . LEFT HEART CATH AND CORONARY ANGIOGRAPHY N/A 05/19/2017   Procedure: LEFT HEART CATH AND CORONARY    ANGIOGRAPHY;  Surgeon: Harding, David W, MD;  Location: [facility];  Service: Cardiovascular;  Laterality: N/A;  . SPHINCTEROTOMY  01/21/2019   Procedure: SPHINCTEROTOMY;  Surgeon: Hung, Patrick, MD;  Location: [facility];  Service: Endoscopy;;  . UPPER ESOPHAGEAL ENDOSCOPIC ULTRASOUND (EUS) N/A 01/21/2019   Procedure: UPPER ESOPHAGEAL ENDOSCOPIC ULTRASOUND (EUS);  Surgeon: Hung, Patrick, MD;  Location: [facility];  Service: Endoscopy;  Laterality: N/A;    I have reviewed the social history and family history with the patient and they are unchanged from previous note.  ALLERGIES:  is allergic to erythromycin.  MEDICATIONS:  Current Outpatient Medications  Medication Sig Dispense Refill  . amLODipine (NORVASC) 5 MG tablet Take 5 mg by mouth daily.     . atenolol (TENORMIN) 25 MG tablet Take 25 mg by mouth daily. Once a day     . atorvastatin (LIPITOR) 10 MG tablet Take 10 mg by mouth daily.    . Blood Glucose Monitoring Suppl (ACCU-CHEK NANO SMARTVIEW) W/DEVICE KIT 1 kit by Does not apply route 2 (two) times daily. (Patient taking differently: 1 kit by Does not apply route See admin instructions. Test blood sugars 12x's daily) 1 kit 0  . clonazePAM (KLONOPIN) 0.5 MG tablet     . glucose blood test strip Test 3 times a day. (Patient taking differently: 1 each by Other route See admin instructions. Test 12 times a day.) 300 each Prn  . insulin NPH Human (NOVOLIN N) 100 UNIT/ML injection Inject 20 Units into the skin at bedtime.     . insulin regular (NOVOLIN R) 100 units/mL injection Inject 20 Units into the skin 3 (three) times daily before meals.     . lidocaine-prilocaine (EMLA) cream Apply to affected area once 30 g 3  . linaclotide (LINZESS) 145 MCG CAPS capsule Take 1 capsule (145 mcg total) by mouth daily before breakfast. (Patient not taking: Reported on 05/09/2020) 30 capsule 2  . lisinopril (ZESTRIL) 20 MG tablet Take 20 mg by mouth daily.     . magnesium oxide (MAG-OX) 400  MG tablet TAKE 1 TABLET BY MOUTH EVERY DAY 90 tablet 1  . mirtazapine (REMERON) 7.5 MG tablet TAKE 1 TABLET (7.5 MG TOTAL) BY MOUTH AT BEDTIME. 90 tablet 1  . morphine (MS CONTIN) 15 MG 12 hr tablet Take 1 tablet (15 mg total) by mouth every 12 (twelve) hours. 30 tablet 0  . ondansetron (ZOFRAN) 8 MG tablet TAKE 1 TABLET BY MOUTH 2 TIMES DAY AS NEEDED. START ON 3RD DAY AFTER CHEMOTHERAPY 30 tablet 1  . oxycodone (OXY-IR) 5 MG capsule Take 1-2 capsules (5-10 mg total) by mouth every 4 (four) hours as needed for pain (severe pain). 90 capsule 0  . pantoprazole (PROTONIX) 40 MG tablet TAKE 1 TABLET BY MOUTH EVERY DAY 90 tablet 1  . prochlorperazine (COMPAZINE) 10 MG tablet TAKE 1 TABLET (10 MG TOTAL) BY MOUTH EVERY 6 (SIX) HOURS AS NEEDED (NAUSEA OR VOMITING). 30 tablet 1   No current facility-administered medications for this visit.   Facility-Administered Medications Ordered in Other Visits  Medication Dose Route Frequency Provider Last Rate Last Admin  . 0.9 %  sodium chloride infusion (Manually program via Guardrails IV Fluids)  250 mL Intravenous Once Feng, Yan, MD        PHYSICAL EXAMINATION: ECOG PERFORMANCE STATUS: 2 - Symptomatic, <50% confined to bed  Vitals:   07/13/20 0834  BP: (!) 112/57  Pulse: 73  Resp: 18  Temp: (!) 97.4 F (36.3 C)    SpO2: 97%   Filed Weights   07/13/20 0834  Weight: 146 lb 3.2 oz (66.3 kg)    GENERAL:alert, no distress and comfortable SKIN: skin color, texture, turgor are normal, no rashes or significant lesions except a small shallow ulcer on mid back  EYES: normal, Conjunctiva are pink and non-injected, sclera clear NECK: supple, thyroid normal size, non-tender, without nodularity LYMPH:  no palpable lymphadenopathy in the cervical, axillary  LUNGS: clear to auscultation and percussion with normal breathing effort HEART: regular rate & rhythm and no murmurs and no lower extremity edema ABDOMEN:abdomen soft, non-tender and normal bowel  sounds Musculoskeletal:no cyanosis of digits and no clubbing  NEURO: alert & oriented x 3 with fluent speech, no focal motor/sensory deficits  LABORATORY DATA:  I have reviewed the data as listed CBC Latest Ref Rng & Units 07/13/2020 06/15/2020 05/19/2020  WBC 4.0 - 10.5 K/uL 5.7 6.2 6.6  Hemoglobin 13.0 - 17.0 g/dL 8.9(L) 9.2(L) 9.6(L)  Hematocrit 39 - 52 % 27.0(L) 28.5(L) 29.1(L)  Platelets 150 - 400 K/uL 155 165 183     CMP Latest Ref Rng & Units 07/13/2020 06/15/2020 05/19/2020  Glucose 70 - 99 mg/dL 187(H) 132(H) 108(H)  BUN 8 - 23 mg/dL 24(H) 24(H) 26(H)  Creatinine 0.61 - 1.24 mg/dL 1.41(H) 1.44(H) 1.41(H)  Sodium 135 - 145 mmol/L 135 137 138  Potassium 3.5 - 5.1 mmol/L 4.0 4.0 3.9  Chloride 98 - 111 mmol/L 105 104 105  CO2 22 - 32 mmol/L _0 Calcium 8.9 - 10.3 mg/dL 9.5 9.7 10.3  Total Protein 6.5 - 8.1 g/dL 7.0 7.3 7.5  Total Bilirubin 0.3 - 1.2 mg/dL 0.8 0.9 1.0  Alkaline Phos 38 - 126 U/L 149(H) 229(H) 138(H)  AST 15 - 41 U/L 52(H) 87(H) 54(H)  ALT 0 - 44 U/L 62(H) 119(H) 59(H)      RADIOGRAPHIC STUDIES: I have personally reviewed the radiological images as listed and agreed with the findings in the report. No results found.   ASSESSMENT & PLAN:  KEMON DEVINCENZI is a 74 y.o. male with    1. Extrahepatic cholangiocarcinoma, cTxN0Mx -He wasdiagnosedin 12/2018. With extrahepatic adenocarcinoma. His extrahepatic cholangiocarcinoma was not resectable due to vascular invasion(surgeon - Dr. Zenia Resides at [facility]) -I initially started him with first line Gemcitabine, Cisplatin, Abraxane in 02/2020. Due to poor tolerance this was reduced to Single agent Gemcitabine after cycle 13 and ultimately stopped since 04/14/20 to proceed with chemo break. -Dr Carlis Abbott did not recommend surgery given his decreased performance statusand technically difficult resection.   -HisCT CAP from 04/12/20 showedEnlarging periportal lymph nodes and new duodenal wall thickening are worrisome for  disease progressionor Inflammation. He declined biopsy and target radiation to tumor.  -Patient at this point is very clear that he is not interested in prolong his life, he is much more valued his quality of life. After lengthy discussion, he declined further chemo and agreed to proceed with supportive care alone.  -Is not ready for hospice at this point, he prefers to follow-up with [place] in clinic. -He is clinically stable and doing well overall, continue supportive care. -I adjusted his pain medication today. -Lab reviewed, slightly worse anemia, no need of blood transfusion today. -Follow-up in 4 weeks   2.Supportive Care:Abdominal pain, Low appetite, Weight loss -Secondary to #1 -For pain he is on Oxycodone 62m 7-8 tablets daily, increased recently -For better pain control, I recommend MS Contin 15 mg twice daily, and use oxycodone for breakthrough pain.  He agrees. -  To avoid constipation he hasDoculax, Miralax and Linzess. He can take Protonix forpossible irritation. -Continue mirtazapine  -Weight has been stable lately.   3.Right-sided pleural effusion, Onset in 06/2019.The etiology is unclear -He has required Thoracentesis periodically. Cytologies have been negative for malignancy. -stable   4.Comorbidities:CAD, HL, HTN, DM, CKD stage III -Onamlodipine, atenolol, lisinopril, and statin. Continue tofollow up withcardiologist Dr. Hardingand PCP Dr Collins.  5. Depression, Social Support, Goal of Care Discussion with DNR/DNI -He notes he has minimal social support. He has few remaining family members, friends, church members that are still living.He does feel like coming to our clinic and going to work allows him to see people, which he enjoys. He continues to f/u with our SW. -I previously recommended DNR/DNI. He agreed. He has Living will and POA (his friend) set up. He notes he is not ready for Hospice care at this time.   PLAN: -I refilled oxycodone 5 mg  as needed, and called in MS Contin 15 mg every 12 hours for better pain control. Plan to refill in 2 weeks if this regimen works for him  -Lab reviewed, continue supportive care -port flush today -pt declined flu shot  -Lab, flush and follow-up in 4 weeks   No problem-specific Assessment & Plan notes found for this encounter.   Orders Placed This Encounter  Procedures  . Sample to Blood Bank    Standing Status:   Future    Standing Expiration Date:   07/13/2021   All questions were answered. The patient knows to call the clinic with any problems, questions or concerns. No barriers to learning was detected. The total time spent in the appointment was 30 minutes.     Yan Feng, MD 07/13/2020   I, Amoya Bennett, am acting as scribe for Yan Feng, MD.   I have reviewed the above documentation for accuracy and completeness, and I agree with the above.       

## 2020-07-13 ENCOUNTER — Telehealth: Payer: Self-pay | Admitting: Hematology

## 2020-07-13 ENCOUNTER — Encounter: Payer: Self-pay | Admitting: Hematology

## 2020-07-13 ENCOUNTER — Inpatient Hospital Stay: Payer: Medicare Other

## 2020-07-13 ENCOUNTER — Other Ambulatory Visit: Payer: Self-pay

## 2020-07-13 ENCOUNTER — Inpatient Hospital Stay: Payer: Medicare Other | Attending: Hematology | Admitting: Hematology

## 2020-07-13 VITALS — BP 112/57 | HR 73 | Temp 97.4°F | Resp 18 | Ht 67.0 in | Wt 146.2 lb

## 2020-07-13 DIAGNOSIS — G893 Neoplasm related pain (acute) (chronic): Secondary | ICD-10-CM | POA: Insufficient documentation

## 2020-07-13 DIAGNOSIS — C221 Intrahepatic bile duct carcinoma: Secondary | ICD-10-CM | POA: Insufficient documentation

## 2020-07-13 DIAGNOSIS — N183 Chronic kidney disease, stage 3 unspecified: Secondary | ICD-10-CM | POA: Diagnosis not present

## 2020-07-13 DIAGNOSIS — I2510 Atherosclerotic heart disease of native coronary artery without angina pectoris: Secondary | ICD-10-CM | POA: Diagnosis not present

## 2020-07-13 DIAGNOSIS — I1310 Hypertensive heart and chronic kidney disease without heart failure, with stage 1 through stage 4 chronic kidney disease, or unspecified chronic kidney disease: Secondary | ICD-10-CM | POA: Diagnosis not present

## 2020-07-13 DIAGNOSIS — R634 Abnormal weight loss: Secondary | ICD-10-CM | POA: Insufficient documentation

## 2020-07-13 DIAGNOSIS — D649 Anemia, unspecified: Secondary | ICD-10-CM | POA: Diagnosis not present

## 2020-07-13 DIAGNOSIS — J90 Pleural effusion, not elsewhere classified: Secondary | ICD-10-CM | POA: Diagnosis not present

## 2020-07-13 DIAGNOSIS — Z95828 Presence of other vascular implants and grafts: Secondary | ICD-10-CM

## 2020-07-13 LAB — CMP (CANCER CENTER ONLY)
ALT: 62 U/L — ABNORMAL HIGH (ref 0–44)
AST: 52 U/L — ABNORMAL HIGH (ref 15–41)
Albumin: 3.2 g/dL — ABNORMAL LOW (ref 3.5–5.0)
Alkaline Phosphatase: 149 U/L — ABNORMAL HIGH (ref 38–126)
Anion gap: 5 (ref 5–15)
BUN: 24 mg/dL — ABNORMAL HIGH (ref 8–23)
CO2: 25 mmol/L (ref 22–32)
Calcium: 9.5 mg/dL (ref 8.9–10.3)
Chloride: 105 mmol/L (ref 98–111)
Creatinine: 1.41 mg/dL — ABNORMAL HIGH (ref 0.61–1.24)
GFR, Estimated: 49 mL/min — ABNORMAL LOW (ref >60)
Glucose, Bld: 187 mg/dL — ABNORMAL HIGH (ref 70–99)
Potassium: 4 mmol/L (ref 3.5–5.1)
Sodium: 135 mmol/L (ref 135–145)
Total Bilirubin: 0.8 mg/dL (ref 0.3–1.2)
Total Protein: 7 g/dL (ref 6.5–8.1)

## 2020-07-13 LAB — CBC WITH DIFFERENTIAL (CANCER CENTER ONLY)
Abs Immature Granulocytes: 0.01 10*3/uL (ref 0.00–0.07)
Basophils Absolute: 0 10*3/uL (ref 0.0–0.1)
Basophils Relative: 1 %
Eosinophils Absolute: 0.2 10*3/uL (ref 0.0–0.5)
Eosinophils Relative: 4 %
HCT: 27 % — ABNORMAL LOW (ref 39.0–52.0)
Hemoglobin: 8.9 g/dL — ABNORMAL LOW (ref 13.0–17.0)
Immature Granulocytes: 0 %
Lymphocytes Relative: 28 %
Lymphs Abs: 1.6 10*3/uL (ref 0.7–4.0)
MCH: 27.7 pg (ref 26.0–34.0)
MCHC: 33 g/dL (ref 30.0–36.0)
MCV: 84.1 fL (ref 80.0–100.0)
Monocytes Absolute: 0.8 10*3/uL (ref 0.1–1.0)
Monocytes Relative: 15 %
Neutro Abs: 3 10*3/uL (ref 1.7–7.7)
Neutrophils Relative %: 52 %
Platelet Count: 155 10*3/uL (ref 150–400)
RBC: 3.21 MIL/uL — ABNORMAL LOW (ref 4.22–5.81)
RDW: 15.9 % — ABNORMAL HIGH (ref 11.5–15.5)
WBC Count: 5.7 10*3/uL (ref 4.0–10.5)
nRBC: 0 % (ref 0.0–0.2)

## 2020-07-13 MED ORDER — OXYCODONE HCL 5 MG PO CAPS: 5.0000 mg | ORAL_CAPSULE | ORAL | 0 refills | Status: DC | PRN

## 2020-07-13 MED ORDER — SODIUM CHLORIDE 0.9% FLUSH
10.0000 mL | INTRAVENOUS | Status: DC | PRN
  Administered 2020-07-13: 10 mL
  Filled 2020-07-13: qty 10

## 2020-07-13 MED ORDER — MORPHINE SULFATE ER 15 MG PO TBCR: 15.0000 mg | EXTENDED_RELEASE_TABLET | Freq: Two times a day (BID) | ORAL | 0 refills | Status: DC

## 2020-07-13 MED ORDER — HEPARIN SOD (PORK) LOCK FLUSH 100 UNIT/ML IV SOLN
500.0000 [IU] | Freq: Once | INTRAVENOUS | Status: AC | PRN
  Administered 2020-07-13: 500 [IU]
  Filled 2020-07-13: qty 5

## 2020-07-13 NOTE — Telephone Encounter (Signed)
Scheduled per 10/14 los. No avs or calendar needed to be printed. Pt is mychart active. 

## 2020-07-14 DIAGNOSIS — E7800 Pure hypercholesterolemia, unspecified: Secondary | ICD-10-CM | POA: Diagnosis not present

## 2020-07-14 DIAGNOSIS — G4700 Insomnia, unspecified: Secondary | ICD-10-CM | POA: Diagnosis not present

## 2020-07-14 DIAGNOSIS — I10 Essential (primary) hypertension: Secondary | ICD-10-CM | POA: Diagnosis not present

## 2020-07-14 DIAGNOSIS — E119 Type 2 diabetes mellitus without complications: Secondary | ICD-10-CM | POA: Diagnosis not present

## 2020-07-14 DIAGNOSIS — C221 Intrahepatic bile duct carcinoma: Secondary | ICD-10-CM | POA: Diagnosis not present

## 2020-07-21 ENCOUNTER — Encounter: Payer: Self-pay | Admitting: Hematology

## 2020-07-21 NOTE — Telephone Encounter (Signed)
For you review pt called states he would like to speak to you concerning his morphine also states PCP Hinton Dyer collins want to add A1c to lab orders

## 2020-07-24 ENCOUNTER — Other Ambulatory Visit: Payer: Self-pay

## 2020-07-24 DIAGNOSIS — C221 Intrahepatic bile duct carcinoma: Secondary | ICD-10-CM

## 2020-07-26 ENCOUNTER — Telehealth: Payer: Self-pay

## 2020-07-26 ENCOUNTER — Other Ambulatory Visit: Payer: Self-pay | Admitting: Hematology

## 2020-07-26 DIAGNOSIS — E119 Type 2 diabetes mellitus without complications: Secondary | ICD-10-CM

## 2020-07-26 DIAGNOSIS — C221 Intrahepatic bile duct carcinoma: Secondary | ICD-10-CM

## 2020-07-26 MED ORDER — MORPHINE SULFATE ER 15 MG PO TBCR: 15.0000 mg | EXTENDED_RELEASE_TABLET | Freq: Three times a day (TID) | ORAL | 0 refills | Status: DC

## 2020-07-26 MED ORDER — OXYCODONE HCL 5 MG PO CAPS
5.0000 mg | ORAL_CAPSULE | Freq: Four times a day (QID) | ORAL | 0 refills | Status: AC | PRN
Start: 2020-07-26 — End: ?

## 2020-07-26 NOTE — Telephone Encounter (Signed)
Mr Boze called back requesting oxycodone refill as well.  I returned his call.  He is taking MS contin 15 mg q 8 hours.  He states now that the Morphine is working better he is taking 1-3 oxycodone tablets daily.  He states he only has 8 tablets left and is requesting a refill for those as well.

## 2020-07-26 NOTE — Telephone Encounter (Signed)
Christopher Welch left vm stating that the new pain regimen is working well.  He needs a refill of morphine.

## 2020-08-01 IMAGING — US US ABDOMEN LIMITED
1 series · 14 of 25 positions shown · non-contrast
Comparison: CT 02/03/2020

CLINICAL DATA: Transaminitis, cholangiocarcinoma

EXAM:
ULTRASOUND ABDOMEN LIMITED RIGHT UPPER QUADRANT

[Series 1: us abdomen limited · 54 acquisitions, 14 frames shown]
[im 1/54]
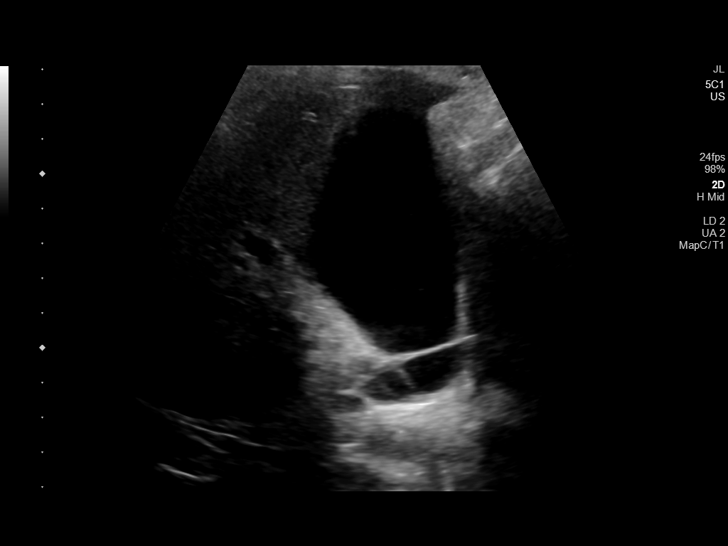
[im 5/54]
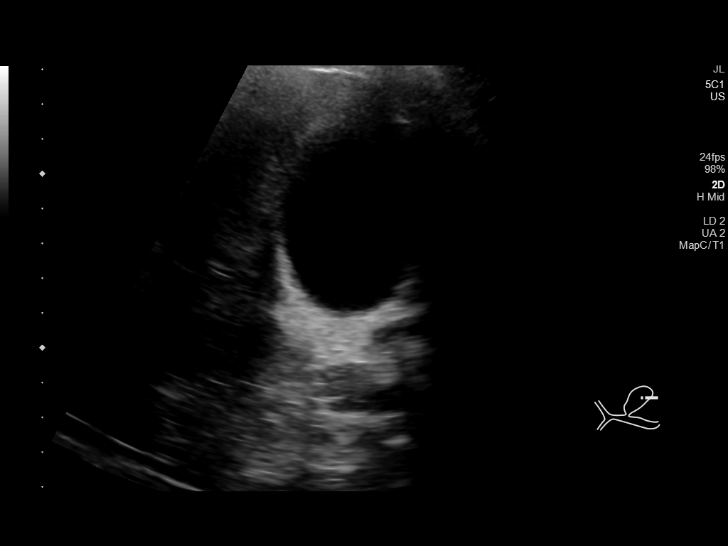
[im 9/54]
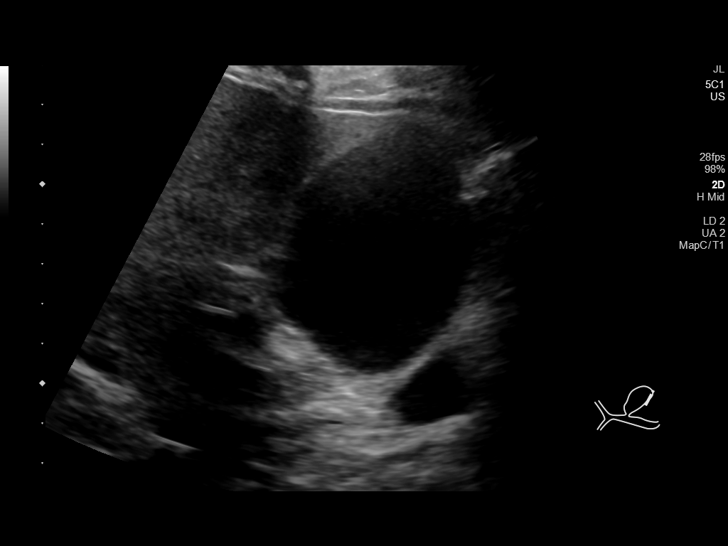
[im 14/54]
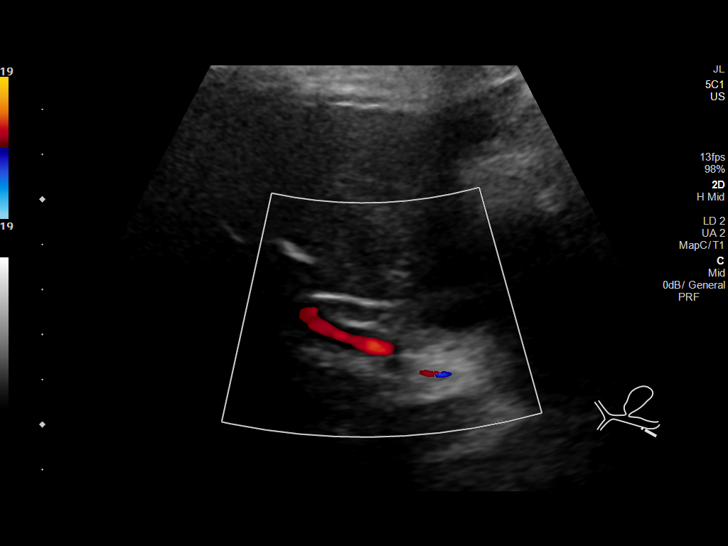
[im 18/54]
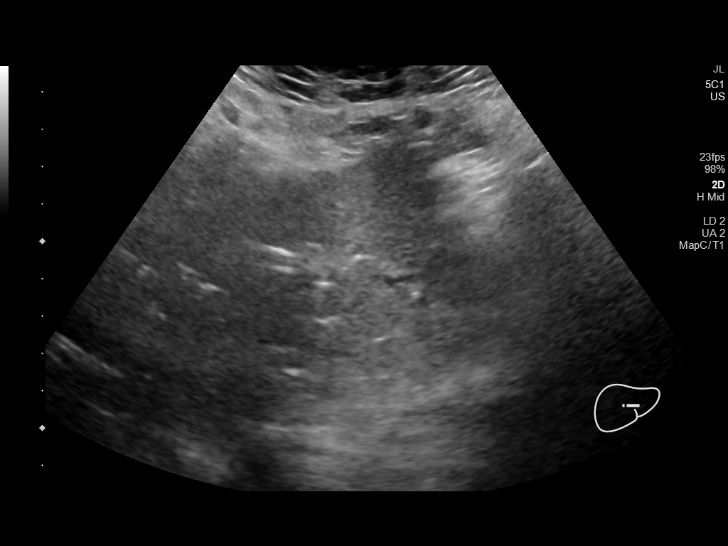
[im 20/54]
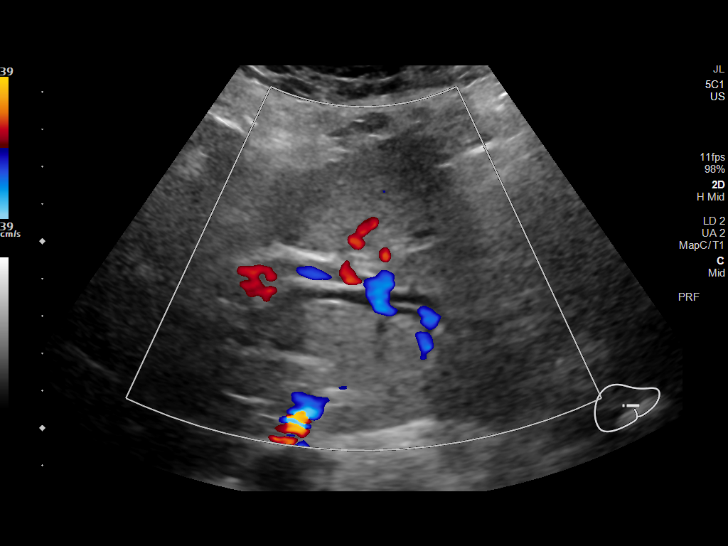
[im 25/54]
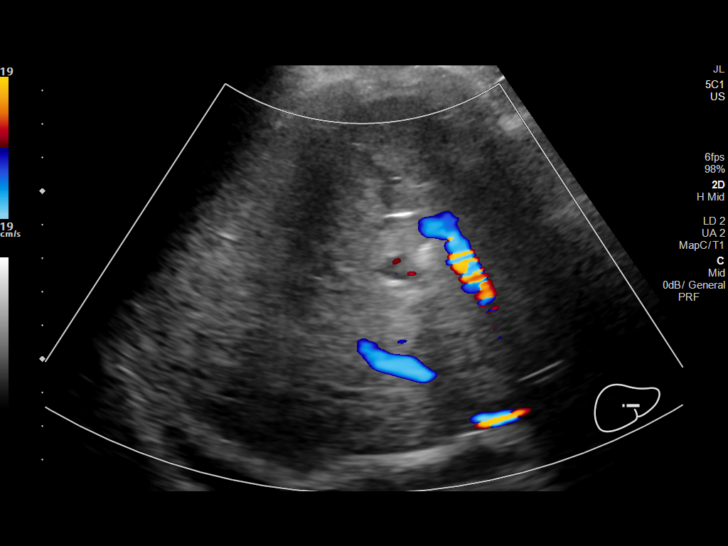
[im 29/54]
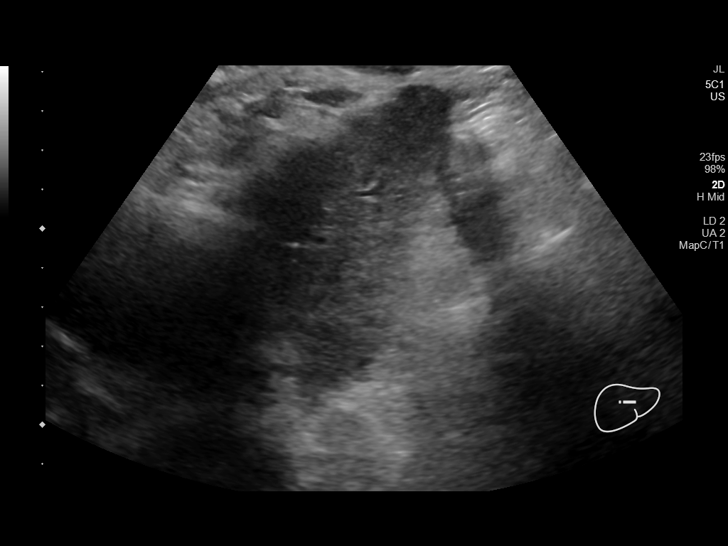
[im 34/54]
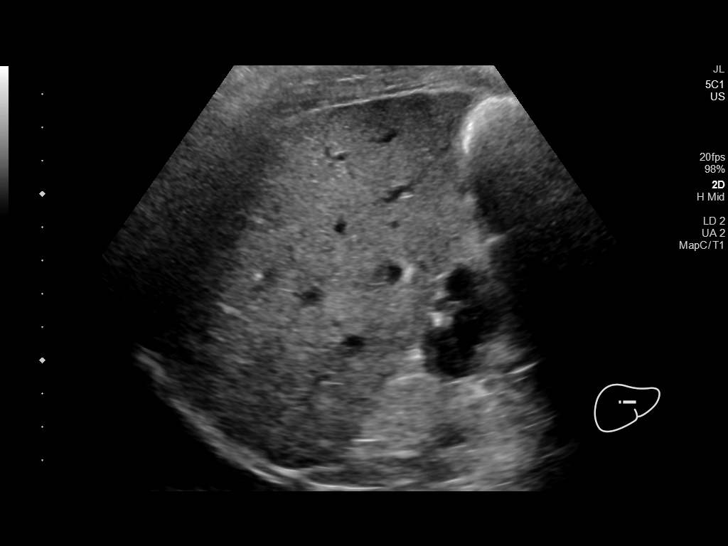
[im 36/54]
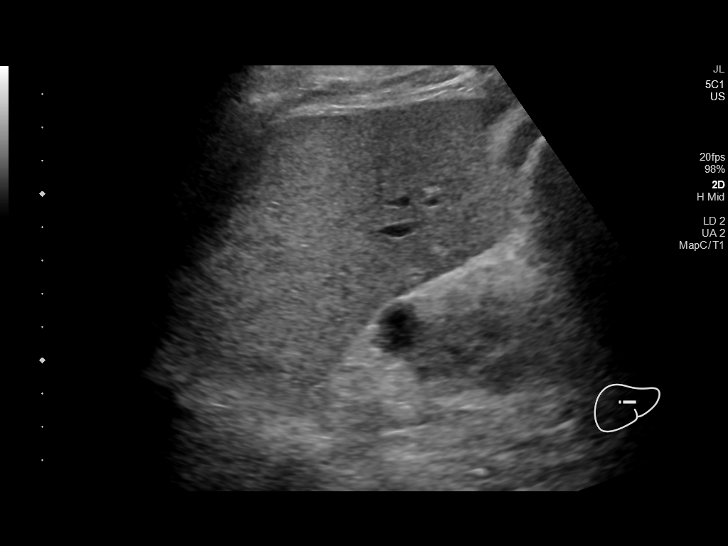
[im 40/54]
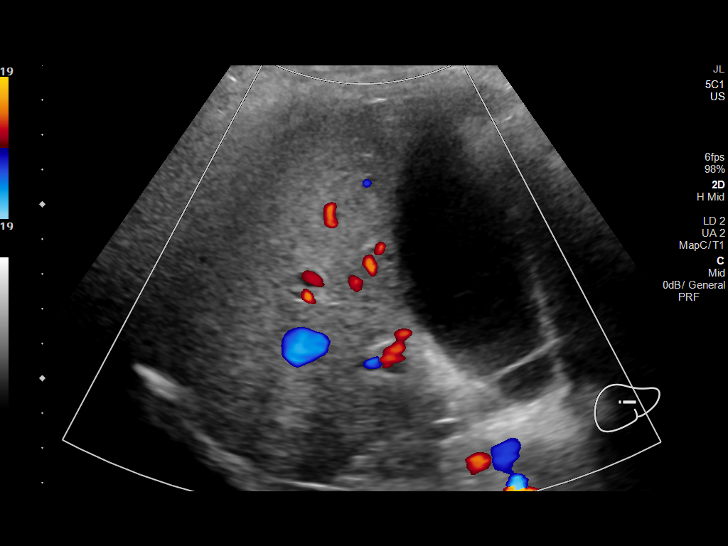
[im 45/54]
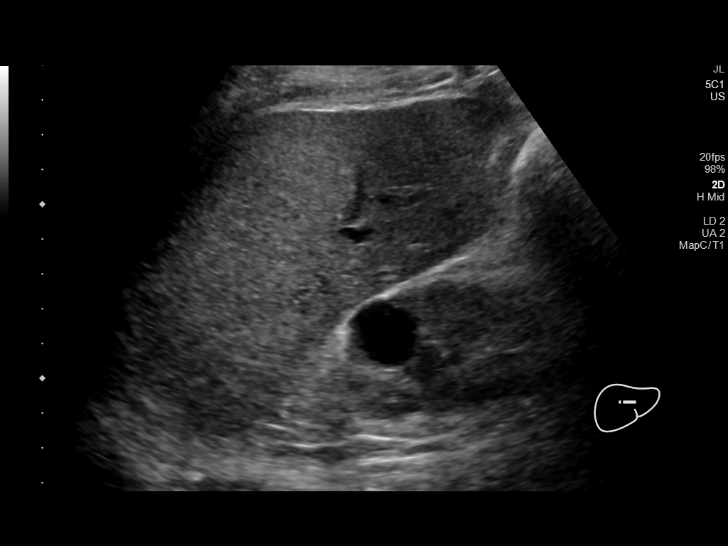
[im 49/54]
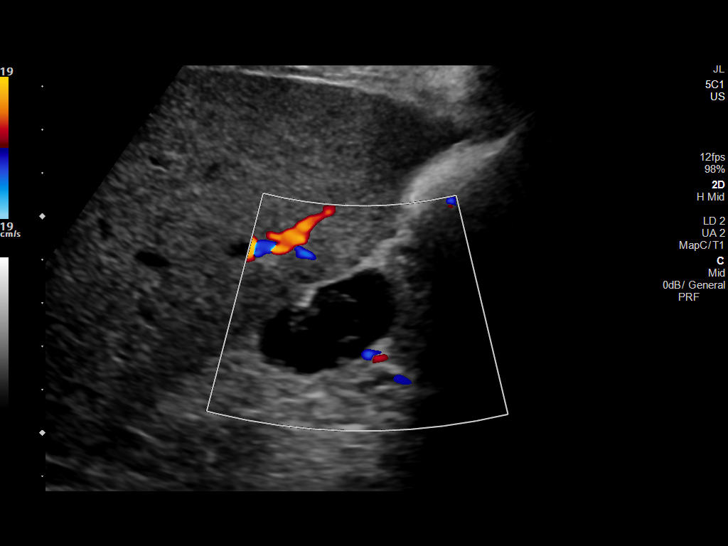
[im 54/54]
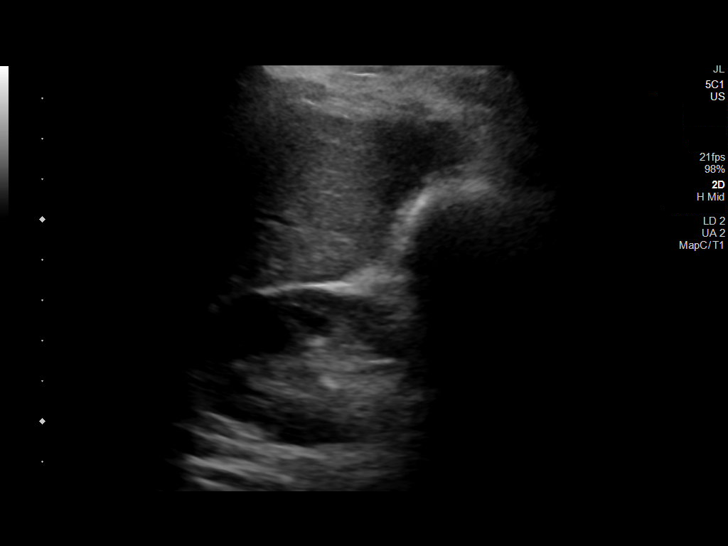

[14 of 25 positions shown; findings below may reference images not displayed]

FINDINGS: Gallbladder:

Gallbladder is distended. No visible stones or wall thickening.
Negative sonographic Nari.

Common bile duct:

Diameter: Metallic stent in place. Only a small portion of the
proximal duct visible, normal caliber, 6 mm.

Liver:

Increased echotexture throughout the liver suggesting fatty
infiltration. There is pneumobilia. Mildly prominent ducts within
the left lobe. No focal abnormality. Portal vein is patent on color
Doppler imaging with normal direction of blood flow towards the
liver.

Other: Complex cystic lesion in the upper pole of the right kidney
measures 3.5 x 4.6 x 1.9 cm, seen on prior CT. Thin internal
septations and debris.
IMPRESSION: Mildly distended gallbladder without visible wall thickening or
stones.

Common bile duct stent in place. Slight prominence of intrahepatic
biliary ducts in the left hepatic lobe.

Increased echotexture of the liver suggesting fatty infiltration.

## 2020-08-02 ENCOUNTER — Encounter: Payer: Self-pay | Admitting: Hematology

## 2020-08-07 ENCOUNTER — Telehealth: Payer: Self-pay

## 2020-08-07 ENCOUNTER — Other Ambulatory Visit: Payer: Self-pay

## 2020-08-07 DIAGNOSIS — C221 Intrahepatic bile duct carcinoma: Secondary | ICD-10-CM

## 2020-08-07 DIAGNOSIS — F32A Depression, unspecified: Secondary | ICD-10-CM

## 2020-08-07 MED ORDER — SERTRALINE HCL 50 MG PO TABS: 50.0000 mg | ORAL_TABLET | Freq: Every day | ORAL | 3 refills | Status: DC

## 2020-08-07 NOTE — Telephone Encounter (Signed)
Ria Comment, Rn from [place] left vm stating that Mr Mccullars has suicidal ideation over the weekend.  She is wanting to know if Dr Burr Medico will prescribe antidepressant or does she want the hospice MD to prescribe.  Forwarded to Dr Burr Medico

## 2020-08-08 ENCOUNTER — Encounter: Payer: Self-pay | Admitting: General Practice

## 2020-08-08 NOTE — Progress Notes (Signed)
[name] Spiritual Care Note  Followed up with Christopher Welch briefly by phone. He was tired from a long conversation with a hospice chaplain, who plans to visit him weekly. He is aware of [name] chaplain availability as well, should a familiar connection be meaningful.   [name], [place], [facility] Pager [phone] Voicemail [phone]

## 2020-08-10 ENCOUNTER — Other Ambulatory Visit: Payer: Medicare Other

## 2020-08-10 ENCOUNTER — Ambulatory Visit: Payer: Medicare Other | Admitting: Hematology

## 2020-08-11 ENCOUNTER — Emergency Department (HOSPITAL_COMMUNITY)
Admission: EM | Admit: 2020-08-11 | Discharge: 2020-08-11 | Disposition: A | Discharge status: Home / Self Care | Attending: Emergency Medicine | Admitting: Emergency Medicine

## 2020-08-11 ENCOUNTER — Encounter (HOSPITAL_COMMUNITY): Payer: Self-pay

## 2020-08-11 ENCOUNTER — Other Ambulatory Visit: Payer: Self-pay

## 2020-08-11 DIAGNOSIS — F32A Depression, unspecified: Secondary | ICD-10-CM | POA: Insufficient documentation

## 2020-08-11 DIAGNOSIS — Z20822 Contact with and (suspected) exposure to covid-19: Secondary | ICD-10-CM | POA: Diagnosis not present

## 2020-08-11 DIAGNOSIS — I10 Essential (primary) hypertension: Secondary | ICD-10-CM | POA: Insufficient documentation

## 2020-08-11 DIAGNOSIS — Z794 Long term (current) use of insulin: Secondary | ICD-10-CM | POA: Diagnosis not present

## 2020-08-11 DIAGNOSIS — R531 Weakness: Secondary | ICD-10-CM | POA: Insufficient documentation

## 2020-08-11 DIAGNOSIS — R109 Unspecified abdominal pain: Secondary | ICD-10-CM | POA: Diagnosis not present

## 2020-08-11 DIAGNOSIS — Z955 Presence of coronary angioplasty implant and graft: Secondary | ICD-10-CM | POA: Diagnosis not present

## 2020-08-11 DIAGNOSIS — Z79899 Other long term (current) drug therapy: Secondary | ICD-10-CM | POA: Diagnosis not present

## 2020-08-11 DIAGNOSIS — R1011 Right upper quadrant pain: Secondary | ICD-10-CM | POA: Insufficient documentation

## 2020-08-11 DIAGNOSIS — R52 Pain, unspecified: Secondary | ICD-10-CM | POA: Diagnosis not present

## 2020-08-11 DIAGNOSIS — E109 Type 1 diabetes mellitus without complications: Secondary | ICD-10-CM | POA: Insufficient documentation

## 2020-08-11 DIAGNOSIS — R45851 Suicidal ideations: Secondary | ICD-10-CM

## 2020-08-11 LAB — CBC WITH DIFFERENTIAL/PLATELET
Abs Immature Granulocytes: 0.03 10*3/uL (ref 0.00–0.07)
Basophils Absolute: 0 10*3/uL (ref 0.0–0.1)
Basophils Relative: 0 %
Eosinophils Absolute: 0 10*3/uL (ref 0.0–0.5)
Eosinophils Relative: 0 %
HCT: 27.3 % — ABNORMAL LOW (ref 39.0–52.0)
Hemoglobin: 9.2 g/dL — ABNORMAL LOW (ref 13.0–17.0)
Immature Granulocytes: 0 %
Lymphocytes Relative: 10 %
Lymphs Abs: 1.2 10*3/uL (ref 0.7–4.0)
MCH: 29 pg (ref 26.0–34.0)
MCHC: 33.7 g/dL (ref 30.0–36.0)
MCV: 86.1 fL (ref 80.0–100.0)
Monocytes Absolute: 1.5 10*3/uL — ABNORMAL HIGH (ref 0.1–1.0)
Monocytes Relative: 13 %
Neutro Abs: 8.6 10*3/uL — ABNORMAL HIGH (ref 1.7–7.7)
Neutrophils Relative %: 77 %
Platelets: 172 10*3/uL (ref 150–400)
RBC: 3.17 MIL/uL — ABNORMAL LOW (ref 4.22–5.81)
RDW: 16.7 % — ABNORMAL HIGH (ref 11.5–15.5)
WBC: 11.3 10*3/uL — ABNORMAL HIGH (ref 4.0–10.5)
nRBC: 0 % (ref 0.0–0.2)

## 2020-08-11 LAB — COMPREHENSIVE METABOLIC PANEL
ALT: 82 U/L — ABNORMAL HIGH (ref 0–44)
AST: 68 U/L — ABNORMAL HIGH (ref 15–41)
Albumin: 3.3 g/dL — ABNORMAL LOW (ref 3.5–5.0)
Alkaline Phosphatase: 140 U/L — ABNORMAL HIGH (ref 38–126)
Anion gap: 8 (ref 5–15)
BUN: 29 mg/dL — ABNORMAL HIGH (ref 8–23)
CO2: 23 mmol/L (ref 22–32)
Calcium: 9.1 mg/dL (ref 8.9–10.3)
Chloride: 104 mmol/L (ref 98–111)
Creatinine, Ser: 1.14 mg/dL (ref 0.61–1.24)
GFR, Estimated: >60 mL/min (ref >60)
Glucose, Bld: 116 mg/dL — ABNORMAL HIGH (ref 70–99)
Potassium: 3.2 mmol/L — ABNORMAL LOW (ref 3.5–5.1)
Sodium: 135 mmol/L (ref 135–145)
Total Bilirubin: 1.2 mg/dL (ref 0.3–1.2)
Total Protein: 7.2 g/dL (ref 6.5–8.1)

## 2020-08-11 LAB — URINALYSIS, ROUTINE W REFLEX MICROSCOPIC
Bacteria, UA: NONE SEEN
Bilirubin Urine: NEGATIVE
Glucose, UA: >=500 mg/dL — AB
Hgb urine dipstick: NEGATIVE
Ketones, ur: NEGATIVE mg/dL
Leukocytes,Ua: NEGATIVE
Nitrite: NEGATIVE
Protein, ur: NEGATIVE mg/dL
Specific Gravity, Urine: 1.017 (ref 1.005–1.030)
pH: 5 (ref 5.0–8.0)

## 2020-08-11 LAB — RAPID URINE DRUG SCREEN, HOSP PERFORMED
Amphetamines: NOT DETECTED
Barbiturates: NOT DETECTED
Benzodiazepines: NOT DETECTED
Cocaine: NOT DETECTED
Opiates: POSITIVE — AB
Tetrahydrocannabinol: NOT DETECTED

## 2020-08-11 LAB — RESPIRATORY PANEL BY RT PCR (FLU A&B, COVID)
Influenza A by PCR: NEGATIVE
Influenza B by PCR: NEGATIVE
SARS Coronavirus 2 by RT PCR: NEGATIVE

## 2020-08-11 LAB — ACETAMINOPHEN LEVEL: Acetaminophen (Tylenol), Serum: <10 ug/mL — ABNORMAL LOW (ref 10–30)

## 2020-08-11 LAB — SALICYLATE LEVEL: Salicylate Lvl: <7 mg/dL — ABNORMAL LOW (ref 7.0–30.0)

## 2020-08-11 LAB — ETHANOL: Alcohol, Ethyl (B): <10 mg/dL (ref <10)

## 2020-08-11 LAB — LIPASE, BLOOD: Lipase: 20 U/L (ref 11–51)

## 2020-08-11 MED ORDER — ONDANSETRON HCL 4 MG/2ML IJ SOLN
4.0000 mg | Freq: Once | INTRAMUSCULAR | Status: AC
  Administered 2020-08-11: 4 mg via INTRAVENOUS
  Filled 2020-08-11: qty 2

## 2020-08-11 MED ORDER — HEPARIN SOD (PORK) LOCK FLUSH 100 UNIT/ML IV SOLN
500.0000 [IU] | Freq: Once | INTRAVENOUS | Status: AC
  Administered 2020-08-11: 500 [IU]
  Filled 2020-08-11: qty 5

## 2020-08-11 MED ORDER — SODIUM CHLORIDE 0.9 % IV BOLUS
500.0000 mL | Freq: Once | INTRAVENOUS | Status: AC
  Administered 2020-08-11: 500 mL via INTRAVENOUS

## 2020-08-11 MED ORDER — MORPHINE SULFATE (PF) 4 MG/ML IV SOLN
4.0000 mg | INTRAVENOUS | Status: AC | PRN
  Administered 2020-08-11 (×3): 4 mg via INTRAVENOUS
  Filled 2020-08-11 (×3): qty 1

## 2020-08-11 NOTE — ED Notes (Signed)
Discharged no concerns at this time.  °

## 2020-08-11 NOTE — Discharge Instructions (Signed)
Please read and follow all provided instructions.  Your diagnoses today include:  1. Abdominal pain, unspecified abdominal location   2. Suicidal ideation     Tests performed today include:  Blood cell counts and platelets - slightly high white blood cells  Kidney and liver function tests - high liver function tests  Pancreas function test (called lipase)  Urine test to look for infection  Vital signs. See below for your results today.   Medications prescribed:   None  Take any prescribed medications only as directed.  Home care instructions:   Follow any educational materials contained in this packet.  Follow-up instructions: Please follow-up with your primary care provider in the next 3 days for further evaluation of your symptoms.    Return instructions:  SEEK IMMEDIATE MEDICAL ATTENTION IF:  The pain does not go away or becomes severe   A temperature above 101F develops   Repeated vomiting occurs (multiple episodes)   The pain becomes localized to portions of the abdomen. The right side could possibly be appendicitis. In an adult, the left lower portion of the abdomen could be colitis or diverticulitis.   Blood is being passed in stools or vomit (bright red or black tarry stools)   You develop chest pain, difficulty breathing, dizziness or fainting, or become confused, poorly responsive, or inconsolable (young children)  If you have any other emergent concerns regarding your health  Additional Information: Abdominal (belly) pain can be caused by many things. Your caregiver performed an examination and possibly ordered blood/urine tests and imaging (CT scan, x-rays, ultrasound). Many cases can be observed and treated at home after initial evaluation in the emergency department. Even though you are being discharged home, abdominal pain can be unpredictable. Therefore, you need a repeated exam if your pain does not resolve, returns, or worsens. Most patients with  abdominal pain don't have to be admitted to the hospital or have surgery, but serious problems like appendicitis and gallbladder attacks can start out as nonspecific pain. Many abdominal conditions cannot be diagnosed in one visit, so follow-up evaluations are very important.  Your vital signs today were: BP 116/66   Pulse 72   Temp 98.3 F (36.8 C) (Oral)   Resp 18   Ht 5\' 8"  (1.727 m)   Wt 63.5 kg   SpO2 100%   BMI 21.29 kg/m  If your blood pressure (bp) was elevated above 135/85 this visit, please have this repeated by your doctor within one month. --------------

## 2020-08-11 NOTE — BH Assessment (Signed)
Comprehensive Clinical Assessment (CCA) Note  08/11/2020 Christopher Welch [id number]  Christopher Welch is a 74 year old male who presents voluntary and unaccompanied to [facility]. 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 Pt reported, he's linked to the [facility] at [facility], his doctor suggested he reside at [facility]. Pt reported, he is in the process of moving to Hospice, he currently lives at home with his spouse. Pt reported, he has a in-home care provider that stays in the evening four days per week and checks in on him Saturday and Sunday. Pt reported, this morning he was in extreme pain, in his abdomin/stomach, trouble walking, heaving breathing, use of hands, he felt bad overall. Pt reported, he called the number for Hospice. Pt reported, he was told he can call anytime and they will send someone to check in on him. Pt reported, he called again discussed his symptoms and was suggested to call EMS. Pt reported, while in pain he did say he was suicidal with a plan of cutting his wrist. Pt reported, he did not mean it, he was in pain. Pt reported, access to "dull" kitchen knives. Pt denies, SI, HI, AVH, self-injurious behaviors.   Pt denies substance use. Pt's denies, being linked to OPT resources (medication management and/or counseling.)   Pt presents alert with normal speech. Pt's mood, affect was pleasant. Pt's thought content was appropriate to mood and circumstances. Pt was oriented x5. Pt's insight and judgement was good. Pt reported, if discharged from [facility] he can contract for safety.   Disposition: Caroline Sauger, PMHNP recommends pt is psych cleared. Disposition with Jinny Sanders and Vladimir Creeks via secure chat in [place].   Diagnosis: Deferred.   *Pt declined for clinician to contact supports to obtain additional information.*  Chief Complaint:  Chief Complaint  Patient presents with  . Weakness  . Abdominal Pain   . Suicidal   Visit Diagnosis:    CCA Screening, Triage and Referral (STR)  Patient Reported Information How did you hear about [place]? No data recorded Referral name: No data recorded Referral phone number: No data recorded  Whom do you see for routine medical problems? No data recorded Practice/Facility Name: No data recorded Practice/Facility Phone Number: No data recorded Name of Contact: No data recorded Contact Number: No data recorded Contact Fax Number: No data recorded Prescriber Name: No data recorded Prescriber Address (if known): No data recorded  What Is the Reason for Your Visit/Call Today? No data recorded How Long Has This Been Causing You Problems? No data recorded What Do You Feel Would Help You the Most Today? No data recorded  Have You Recently Been in Any Inpatient Treatment (Hospital/Detox/Crisis Center/28-Day Program)? No data recorded Name/Location of Program/Hospital:No data recorded How Long Were You There? No data recorded When Were You Discharged? No data recorded  Have You Ever Received Services From [facility] Before? No data recorded Who Do You See at [facility]? No data recorded  Have You Recently Had Any Thoughts About Hurting Yourself? No data recorded Are You Planning to Commit Suicide/Harm Yourself At This time? No data recorded  Have you Recently Had Thoughts About [place]? No data recorded Explanation: No data recorded  Have You Used Any Alcohol or Drugs in the Past 24 Hours? No data recorded How Long Ago Did You Use Drugs or Alcohol? No data recorded What Did You Use and How Much? No  data recorded  Do You Currently Have a Therapist/Psychiatrist? No data recorded Name of Therapist/Psychiatrist: No data recorded  Have You Been Recently Discharged From Any Office Practice or Programs? No data recorded Explanation of Discharge From Practice/Program: No data recorded    CCA Screening Triage Referral Assessment Type of  Contact: No data recorded Is this Initial or Reassessment? No data recorded Date Telepsych consult ordered in [facility]:  No data recorded Time Telepsych consult ordered in [facility]:  No data recorded  Patient Reported Information Reviewed? No data recorded Patient Left Without Being Seen? No data recorded Reason for Not Completing Assessment: No data recorded  Collateral Involvement: No data recorded  Does Patient Have a [place]? No data recorded Name and Contact of Legal Guardian: No data recorded If Minor and Not Living with Parent(s), Who has Custody? No data recorded Is CPS involved or ever been involved? No data recorded Is APS involved or ever been involved? No data recorded  Patient Determined To Be At Risk for Harm To Self or Others Based on Review of Patient Reported Information or Presenting Complaint? No data recorded Method: No data recorded Availability of Means: No data recorded Intent: No data recorded Notification Required: No data recorded Additional Information for Danger to Others Potential: No data recorded Additional Comments for Danger to Others Potential: No data recorded Are There Guns or Other Weapons in Your Home? No data recorded Types of Guns/Weapons: No data recorded Are These Weapons Safely Secured?                            No data recorded Who Could Verify You Are Able To Have These Secured: No data recorded Do You Have any Outstanding Charges, Pending Court Dates, Parole/Probation? No data recorded Contacted To Inform of Risk of Harm To Self or Others: No data recorded  Location of Assessment: No data recorded  Does Patient Present under Involuntary Commitment? No data recorded IVC Papers Initial File Date: No data recorded  [place] of Residence: No data recorded  Patient Currently Receiving the Following Services: No data recorded  Determination of Need: No data recorded  Options For Referral: No data recorded   CCA  Biopsychosocial Intake/Chief Complaint:  Per EDP/PA note: "74 year old male with history of cholangiocarcinoma in the care of hospice brought in by EMS from home for generalized weakness, abdominal pain not relieved with oxycodone at home, feeling suicidal- no improvement with Zoloft, with plan to cut his wrists. No prior suicide attempts, history of depression years ago, started Zoloft 1 week ago and reports feeling nauseous without improvement in depression. Denies fevers, chills, changes in bowel habits. Reports decreased PO intake today and decreased urinary output without dysuria or hematuria. No other complaints or concerns. Patient states he called his hospice care team and was advised to come to the ER."  Current Symptoms/Problems: No data recorded  Patient Reported Schizophrenia/Schizoaffective Diagnosis in Past: No   Strengths: No data recorded Preferences: No data recorded Abilities: No data recorded  Type of Services Patient Feels are Needed: For medical attention.   Initial Clinical Notes/Concerns: No data recorded  Mental Health Symptoms Depression:  Increase/decrease in appetite;Hopelessness   Duration of Depressive symptoms: No data recorded  Mania:  None   Anxiety:   None   Psychosis:  None   Duration of Psychotic symptoms: No data recorded  Trauma:  None   Obsessions:  None   Compulsions:  None   Inattention:  None   Hyperactivity/Impulsivity:  N/A   Oppositional/Defiant Behaviors:  None   Emotional Irregularity:  None   Other Mood/Personality Symptoms:  No data recorded   Mental Status Exam Appearance and self-care  Stature:  Average   Weight:  No data recorded  Clothing:  No data recorded  Grooming:  Normal   Cosmetic use:  None   Posture/gait:  Normal   Motor activity:  Not Remarkable   Sensorium  Attention:  Normal   Concentration:  Normal   Orientation:  X5   Recall/memory:  Normal   Affect and Mood  Affect:  Other (Comment)  (Pleasant.)   Mood:  Other (Comment) (Pleasant.)   Relating  Eye contact:  No data recorded  Facial expression:  Responsive   Attitude toward examiner:  Cooperative   Thought and Language  Speech flow: Normal   Thought content:  Appropriate to Mood and Circumstances   Preoccupation:  None   Hallucinations:  None   Organization:  No data recorded  [employer] of Knowledge:  Fair   Intelligence:  Average   Abstraction:  No data recorded  Judgement:  Good   Reality Testing:  Realistic   Insight:  Good   Decision Making:  Normal   Social Functioning  Social Maturity:  No data recorded  Social Judgement:  Normal   Stress  Stressors:  Other (Comment)   Coping Ability:  No data recorded  Skill Deficits:  No data recorded  Supports:  Family     Religion: Religion/Spirituality Are You A Religious Person?: Yes (Pt reported, he believes in God but not does not practice a particular religion.)  Leisure/Recreation: Leisure / Recreation Do You Have Hobbies?: Yes Leisure and Hobbies: Reading, playing the organ.  Exercise/Diet: Exercise/Diet Do You Exercise?: Yes What Type of Exercise Do You Do?: Run/Walk (Pt reported, walking around the house.) Do You Follow a Special Diet?: No Do You Have Any Trouble Sleeping?: Yes Explanation of Sleeping Difficulties: 6 hours at night and naps during the day.   CCA Employment/Education Employment/Work Situation: Employment / Work Situation Employment situation: Employed Where is patient currently employed?: [employer], pt works two days week for a couple hours. Has patient ever been in the [employer]?: No  Education: Education Last Grade Completed: 12 Name of High School: [employer] Did You Graduate From [employer]?: Yes Did You Attend College?: Yes What Type of College Degree Do you Have?: Bachelor's degree in History and Music (double major) and Teachers Certifcation. Did You  Attend Graduate School?: Yes What is Your Post Graduate Degree?: Masters degree. 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   CCA Family/Childhood History Family and Relationship History: Family history Marital status: Married Does patient have children?: No  Childhood History:  Childhood History Additional childhood history information: NA Description of patient's relationship with caregiver when they were a child: NA Patient's description of current relationship with people who raised him/her: NA How were you disciplined when you got in trouble as a child/adolescent?: NA Does patient have siblings?: Yes Number of Siblings: 1 Did patient suffer any verbal/emotional/physical/sexual abuse as a child?: No (Pt denies.) Did patient suffer from severe childhood neglect?: No (Pt denies.) Has patient ever been sexually abused/assaulted/raped as an adolescent or adult?: No (Pt denies.) Was the patient ever a victim of a crime or a disaster?: No (Pt denies.) Witnessed domestic violence?: No (Pt denies.) Has patient been affected by domestic violence  as an adult?:  (NA)  Child/Adolescent Assessment:     CCA Substance Use Alcohol/Drug Use: Alcohol / Drug Use Pain Medications: See MAR Prescriptions: See MAR Over the Counter: See MAR History of alcohol / drug use?: No history of alcohol / drug abuse     ASAM's:  Six Dimensions of Multidimensional Assessment  Dimension 1:  Acute Intoxication and/or Withdrawal Potential:      Dimension 2:  Biomedical Conditions and Complications:      Dimension 3:  Emotional, Behavioral, or Cognitive Conditions and Complications:     Dimension 4:  Readiness to Change:     Dimension 5:  Relapse, Continued use, or Continued Problem Potential:     Dimension 6:  Recovery/Living Environment:     ASAM Severity Score:    ASAM Recommended Level of Treatment:     Substance use Disorder (SUD)    Recommendations for  Services/Supports/Treatments: Recommendations for Services/Supports/Treatments Recommendations For Services/Supports/Treatments: Other (Comment) (Pt is psych cleared.)  DSM5 Diagnoses: Patient Active Problem List   Diagnosis Date Noted  . Encounter for antineoplastic chemotherapy 01/20/2020  . Fever 12/23/2019  . Non-intractable vomiting with nausea 06/20/2019  . Abdominal pain 06/17/2019  . AKI (acute kidney injury) ([place]) 06/17/2019  . Hyperglycemia 06/17/2019  . Hyponatremia 06/17/2019  . Hyperkalemia 06/17/2019  . Acute cholecystitis 05/03/2019  . Thrombocytopenia ([place]) 05/03/2019  . Anemia 05/03/2019  . Port-A-Cath in place 03/19/2019  . Cholangiocarcinoma ([place]) 03/08/2019  . Goals of care, counseling/discussion 03/08/2019  . Abnormal cardiac CT angiography 05/19/2017  . Atypical angina ([place]) 04/23/2017  . Chest pain with moderate risk for cardiac etiology 04/17/2017  . Essential hypertension   . Dyslipidemia   . Insomnia 07/28/2015  . Hypoglycemia 04/14/2015  . Diabetic retinopathy, nonproliferative, mild ([place]) 12/05/2011  . DM2 (diabetes mellitus, type 2) ([place]) 01/12/2008  . Hyperlipidemia with target low density lipoprotein (LDL) cholesterol less than 70 mg/dL 01/12/2008  . HYPERTENSION 01/12/2008     Referrals to Alternative Service(s): Referred to Alternative Service(s):   Place:   Date:   Time:    Referred to Alternative Service(s):   Place:   Date:   Time:    Referred to Alternative Service(s):   Place:   Date:   Time:    Referred to Alternative Service(s):   Place:   Date:   Time:     Vertell Novak, LCMHCComprehensive Clinical Assessment (CCA) Screening, Triage and Referral Note  08/11/2020 Christopher Welch [id number]  Chief Complaint:  Chief Complaint  Patient presents with  . Weakness  . Abdominal Pain  . Suicidal   Visit Diagnosis:   Patient Reported Information How did you hear about [place]? No data recorded  Referral name: No data  recorded  Referral phone number: No data recorded Whom do you see for routine medical problems? No data recorded  Practice/Facility Name: No data recorded  Practice/Facility Phone Number: No data recorded  Name of Contact: No data recorded  Contact Number: No data recorded  Contact Fax Number: No data recorded  Prescriber Name: No data recorded  Prescriber Address (if known): No data recorded What Is the Reason for Your Visit/Call Today? No data recorded How Long Has This Been Causing You Problems? No data recorded Have You Recently Been in Any Inpatient Treatment (Hospital/Detox/Crisis Center/28-Day Program)? No data recorded  Name/Location of Program/Hospital:No data recorded  How Long Were You There? No data recorded  When Were You Discharged? No data recorded Have You Ever Received Services From [facility]  Before? No data recorded  Who Do You See at [facility]? No data recorded Have You Recently Had Any Thoughts About Hurting Yourself? No data recorded  Are You Planning to Commit Suicide/Harm Yourself At This time?  No data recorded Have you Recently Had Thoughts About [place]? No data recorded  Explanation: No data recorded Have You Used Any Alcohol or Drugs in the Past 24 Hours? No data recorded  How Long Ago Did You Use Drugs or Alcohol?  No data recorded  What Did You Use and How Much? No data recorded What Do You Feel Would Help You the Most Today? No data recorded Do You Currently Have a Therapist/Psychiatrist? No data recorded  Name of Therapist/Psychiatrist: No data recorded  Have You Been Recently Discharged From Any Office Practice or Programs? No data recorded  Explanation of Discharge From Practice/Program:  No data recorded    CCA Screening Triage Referral Assessment Type of Contact: No data recorded  Is this Initial or Reassessment? No data recorded  Date Telepsych consult ordered in [facility]:  No data recorded  Time Telepsych consult ordered in [facility]:   No data recorded Patient Reported Information Reviewed? No data recorded  Patient Left Without Being Seen? No data recorded  Reason for Not Completing Assessment: No data recorded Collateral Involvement: No data recorded Does Patient Have a [place]? No data recorded  Name and Contact of Legal Guardian:  No data recorded If Minor and Not Living with Parent(s), Who has Custody? No data recorded Is CPS involved or ever been involved? No data recorded Is APS involved or ever been involved? No data recorded Patient Determined To Be At Risk for Harm To Self or Others Based on Review of Patient Reported Information or Presenting Complaint? No data recorded  Method: No data recorded  Availability of Means: No data recorded  Intent: No data recorded  Notification Required: No data recorded  Additional Information for Danger to Others Potential:  No data recorded  Additional Comments for Danger to Others Potential:  No data recorded  Are There Guns or Other Weapons in Your Home?  No data recorded   Types of Guns/Weapons: No data recorded   Are These Weapons Safely Secured?                              No data recorded   Who Could Verify You Are Able To Have These Secured:    No data recorded Do You Have any Outstanding Charges, Pending Court Dates, Parole/Probation? No data recorded Contacted To Inform of Risk of Harm To Self or Others: No data recorded Location of Assessment: No data recorded Does Patient Present under Involuntary Commitment? No data recorded  IVC Papers Initial File Date: No data recorded  [place] of Residence: No data recorded Patient Currently Receiving the Following Services: No data recorded  Determination of Need: No data recorded  Options For Referral: No data recorded  Vertell Novak, [place], MS, [facility], [facility] Triage Specialist [phone]

## 2020-08-11 NOTE — Progress Notes (Signed)
Manufacturing engineer [facility]) Hospital Liaison Note     Christopher Welch receives hospice services with a terminal diagnosis of cholangiocarcinoma. He was admitted to services on 08/01/20. He had since reported intermittent suicide ideations, with no plan, and was started on Zoloft on 08/07/20.  Pt reports not taking this med every day.    [facility] will continue to follow for any discharge planning needs and to coordinate continuation of hospice care.   If you have questions or need assistance, please call [phone] or contact the hospital Liaison listed on AMION.     Thank you for the opportunity to participate in this patient's care.     Domenic Moras, BSN, RN [facility] Liaison   [phone]

## 2020-08-11 NOTE — ED Triage Notes (Signed)
Pt BIB EMS. Pt was told to call EMS by hospice due to patient having increased weakness, difficulty ambulating, abd pain, and suicidal thoughts. Pt has hx of cancer. Pt stated he has plan of hurting himself by cutting his wrist.

## 2020-08-11 NOTE — ED Provider Notes (Signed)
[place] DEPT Provider Note   CSN: [id number] Arrival date & time: 08/11/20  1551     History Chief Complaint  Patient presents with  . Weakness  . Abdominal Pain  . Suicidal    Christopher Welch is a 74 y.o. male.  74 year old male with history of cholangiocarcinoma in the care of hospice brought in by EMS from home for generalized weakness, abdominal pain not relieved with oxycodone at home, feeling suicidal- no improvement with Zoloft, with plan to cut his wrists. No prior suicide attempts, history of depression years ago, started Zoloft 1 week ago and reports feeling nauseous without improvement in depression. Denies fevers, chills, changes in bowel habits. Reports decreased PO intake today and decreased urinary output without dysuria or hematuria. No other complaints or concerns. Patient states he called his hospice care team and was advised to come to the ER.         Past Medical History:  Diagnosis Date  . Diabetes mellitus type I, controlled ([place])   . Dyslipidemia   . ED (erectile dysfunction)   . Essential hypertension   . Gilbert's disease   . Hepatitis B carrier [facility])     Patient Active Problem List   Diagnosis Date Noted  . Encounter for antineoplastic chemotherapy 01/20/2020  . Fever 12/23/2019  . Non-intractable vomiting with nausea 06/20/2019  . Abdominal pain 06/17/2019  . AKI (acute kidney injury) ([place]) 06/17/2019  . Hyperglycemia 06/17/2019  . Hyponatremia 06/17/2019  . Hyperkalemia 06/17/2019  . Acute cholecystitis 05/03/2019  . Thrombocytopenia ([place]) 05/03/2019  . Anemia 05/03/2019  . Port-A-Cath in place 03/19/2019  . Cholangiocarcinoma ([place]) 03/08/2019  . Goals of care, counseling/discussion 03/08/2019  . Abnormal cardiac CT angiography 05/19/2017  . Atypical angina ([place]) 04/23/2017  . Chest pain with moderate risk for cardiac etiology 04/17/2017  . Essential hypertension   . Dyslipidemia   . Insomnia  07/28/2015  . Hypoglycemia 04/14/2015  . Diabetic retinopathy, nonproliferative, mild ([place]) 12/05/2011  . DM2 (diabetes mellitus, type 2) ([place]) 01/12/2008  . Hyperlipidemia with target low density lipoprotein (LDL) cholesterol less than 70 mg/dL 01/12/2008  . HYPERTENSION 01/12/2008    Past Surgical History:  Procedure Laterality Date  . APPENDECTOMY  1964  . BILIARY BRUSHING  01/21/2019   Procedure: BILIARY BRUSHING;  Surgeon: Carol Ada, MD;  Location: [facility] ENDOS[facility];  Service: Endoscopy;;  . BILIARY STENT PLACEMENT N/A 01/21/2019   Procedure: BILIARY STENT PLACEMENT;  Surgeon: Carol Ada, MD;  Location: [facility] ENDOS[facility];  Service: Endoscopy;  Laterality: N/A;  . ENDOSCOPIC RETROGRADE CHOLANGIOPANCREATOGRAPHY (ERCP) WITH PROPOFOL N/A 01/21/2019   Procedure: ENDOSCOPIC RETROGRADE CHOLANGIOPANCREATOGRAPHY (ERCP) WITH PROPOFOL;  Surgeon: Carol Ada, MD;  Location: [facility];  Service: Endoscopy;  Laterality: N/A;  . ESOPHAGOGASTRODUODENOSCOPY (EGD) WITH PROPOFOL N/A 01/21/2019   Procedure: ESOPHAGOGASTRODUODENOSCOPY (EGD) WITH PROPOFOL;  Surgeon: Carol Ada, MD;  Location: [facility];  Service: Endoscopy;  Laterality: N/A;  . FEMORAL HERNIA REPAIR    . IR CHOLANGIOGRAM EXISTING TUBE  05/13/2019  . IR CHOLANGIOGRAM EXISTING TUBE  06/08/2019  . IR EXCHANGE BILIARY DRAIN  05/18/2019  . IR EXCHANGE BILIARY DRAIN  06/08/2019  . IR IMAGING GUIDED PORT INSERTION  03/08/2019  . IR PERC CHOLECYSTOSTOMY  05/04/2019  . IR THORACENTESIS ASP PLEURAL SPACE W/IMG GUIDE  10/29/2019  . LEFT HEART CATH AND CORONARY ANGIOGRAPHY N/A 05/19/2017   Procedure: LEFT HEART CATH AND CORONARY ANGIOGRAPHY;  Surgeon: Leonie Man, MD;  Location: [facility] CV LAB;  Service:  Cardiovascular;  Laterality: N/A;  . SPHINCTEROTOMY  01/21/2019   Procedure: SPHINCTEROTOMY;  Surgeon: Carol Ada, MD;  Location: [facility];  Service: Endoscopy;;  . UPPER ESOPHAGEAL ENDOSCOPIC ULTRASOUND (EUS) N/A 01/21/2019   Procedure:  UPPER ESOPHAGEAL ENDOSCOPIC ULTRASOUND (EUS);  Surgeon: Carol Ada, MD;  Location: [facility];  Service: Endoscopy;  Laterality: N/A;       Family History  Problem Relation Age of Onset  . Diabetes Father     Social History   Tobacco Use  . Smoking status: Never Smoker  . Smokeless tobacco: Never Used  Vaping Use  . Vaping Use: Never used  Substance Use Topics  . Alcohol use: Yes    Alcohol/week: 7.0 standard drinks    Types: 7 Glasses of wine per week  . Drug use: No    Home Medications Prior to Admission medications   Medication Sig Start Date End Date Taking? Authorizing Provider  amLODipine (NORVASC) 5 MG tablet Take 5 mg by mouth daily.    Yes [provider]  atenolol (TENORMIN) 25 MG tablet Take 25 mg by mouth daily. Once a day    Yes [provider]  insulin NPH Human (NOVOLIN N) 100 UNIT/ML injection Inject 20 Units into the skin 2 (two) times daily before a meal.    Yes [provider]  insulin regular (NOVOLIN R) 100 units/mL injection Inject 20 Units into the skin 2 (two) times daily before a meal.    Yes [provider]  mirtazapine (REMERON) 7.5 MG tablet TAKE 1 TABLET (7.5 MG TOTAL) BY MOUTH AT BEDTIME. 06/06/20  Yes [name], MD  oxycodone (OXY-IR) 5 MG capsule Take 1-2 capsules (5-10 mg total) by mouth every 6 (six) hours as needed for pain (severe pain). 07/26/20  Yes [name], MD  Blood Glucose Monitoring Suppl (ACCU-CHEK NANO SMARTVIEW) W/DEVICE KIT 1 kit by Does not apply route 2 (two) times daily. Patient taking differently: 1 kit by Does not apply route See admin instructions. Test blood sugars 12x's daily 10/23/12   Denita Lung, MD  glucose blood test strip Test 3 times a day. Patient taking differently: 1 each by Other route See admin instructions. Test 12 times a day. 10/26/12   Denita Lung, MD  lidocaine-prilocaine (EMLA) cream Apply to affected area once Patient not taking: Reported on 08/11/2020 03/08/19    [name], MD  linaclotide [facility]) 145 MCG CAPS capsule Take 1 capsule (145 mcg total) by mouth daily before breakfast. Patient not taking: Reported on 05/09/2020 04/28/20   [name], MD  magnesium oxide (MAG-OX) 400 MG tablet TAKE 1 TABLET BY MOUTH EVERY DAY Patient not taking: Reported on 08/11/2020 11/17/19   [name], MD  morphine (MS CONTIN) 15 MG 12 hr tablet Take 1 tablet (15 mg total) by mouth every 8 (eight) hours. Patient not taking: Reported on 08/11/2020 07/26/20   [name], MD  ondansetron (ZOFRAN) 8 MG tablet TAKE 1 TABLET BY MOUTH 2 TIMES DAY AS NEEDED. START ON 3RD DAY AFTER CHEMOTHERAPY Patient taking differently: Take 8 mg by mouth 2 (two) times daily as needed for nausea or vomiting.  08/19/19   [name], MD  pantoprazole (PROTONIX) 40 MG tablet TAKE 1 TABLET BY MOUTH EVERY DAY Patient not taking: Reported on 08/11/2020 05/16/20   [name], MD  prochlorperazine (COMPAZINE) 10 MG tablet TAKE 1 TABLET (10 MG TOTAL) BY MOUTH EVERY 6 (SIX) HOURS AS NEEDED (NAUSEA OR VOMITING). Patient not taking: Reported on 08/11/2020 08/19/19  [name], MD  sertraline (ZOLOFT) 50 MG tablet Take 1 tablet (50 mg total) by mouth daily. Patient not taking: Reported on 08/11/2020 08/07/20   [name], MD    Allergies    Erythromycin  Review of Systems   Review of Systems  Constitutional: Negative for fever.  Respiratory: Negative for shortness of breath.   Cardiovascular: Negative for chest pain.  Gastrointestinal: Positive for abdominal pain. Negative for abdominal distention, constipation, diarrhea, nausea and vomiting.  Genitourinary: Positive for decreased urine volume. Negative for dysuria and hematuria.  Musculoskeletal: Negative for arthralgias and myalgias.  Skin: Negative for rash and wound.  Allergic/Immunologic: Positive for immunocompromised state.  Neurological: Positive for weakness.  Psychiatric/Behavioral: Positive for suicidal ideas. Negative for confusion.  All other  systems reviewed and are negative.   Physical Exam Updated Vital Signs BP (!) 128/59   Pulse 79   Temp 98.3 F (36.8 C) (Oral)   Resp 17   Ht _0  (1.727 m)   Wt 63.5 kg   SpO2 99%   BMI 21.29 kg/m   Physical Exam Vitals and nursing note reviewed.  Constitutional:      General: He is not in acute distress.    Appearance: He is well-developed. He is not diaphoretic.  HENT:     Head: Normocephalic and atraumatic.  Cardiovascular:     Rate and Rhythm: Normal rate and regular rhythm.  Pulmonary:     Effort: Pulmonary effort is normal.     Breath sounds: Normal breath sounds.  Chest:    Abdominal:     Palpations: Abdomen is soft.     Tenderness: There is abdominal tenderness in the right upper quadrant.     Comments: Mild tenderness RUQ  Skin:    General: Skin is warm and dry.     Findings: No erythema or rash.  Neurological:     Mental Status: He is alert and oriented to person, place, and time.  Psychiatric:        Attention and Perception: Attention normal.        Mood and Affect: Mood is depressed.        Speech: Speech normal.        Behavior: Behavior normal. Behavior is not agitated or withdrawn.        Thought Content: Thought content is not paranoid. Thought content includes suicidal ideation. Thought content does not include homicidal ideation. Thought content includes suicidal plan.     ED Results / Procedures / Treatments   Labs (all labs ordered are listed, but only abnormal results are displayed) Labs Reviewed  CBC WITH DIFFERENTIAL/PLATELET - Abnormal; Notable for the following components:      Result Value   WBC 11.3 (*)    RBC 3.17 (*)    Hemoglobin 9.2 (*)    HCT 27.3 (*)    RDW 16.7 (*)    Neutro Abs 8.6 (*)    Monocytes Absolute 1.5 (*)    All other components within normal limits  RESPIRATORY PANEL BY RT PCR (FLU A&B, COVID)  COMPREHENSIVE METABOLIC PANEL  LIPASE, BLOOD  URINALYSIS, ROUTINE W REFLEX MICROSCOPIC  RAPID URINE DRUG  SCREEN, HOSP PERFORMED  ETHANOL  SALICYLATE LEVEL  ACETAMINOPHEN LEVEL    EKG None  Radiology No results found.  Procedures Procedures (including critical care time)  Medications Ordered in ED Medications  morphine 4 MG/ML injection 4 mg (4 mg Intravenous Given 08/11/20 1632)  ondansetron (ZOFRAN) injection 4 mg (4 mg Intravenous Given 08/11/20  1633)  sodium chloride 0.9 % bolus 500 mL (500 mLs Intravenous New Bag/Given 08/11/20 1634)    ED Course  I have reviewed the triage vital signs and the nursing notes.  Pertinent labs & imaging results that were available during my care of the patient were reviewed by me and considered in my medical decision making (see chart for details).  Clinical Course as of Aug 11 1724  Fri Aug 11, 7865  874 74 year old male with history of cholangiocarcinoma and hospice care presents with report of feeling progressively more weak with ongoing abdominal pain with suicidal ideation with plan to cut his wrists. Patient states that he is home alone, does not have family or friends nearby and only has in-home care 4 days a week, does not have in-home care today. Patient is relieved to be in the emergency room to have someone to talk to today and seems generally lonely. Discussed patient's concerns for his weakness and his abdominal pain, offered work-up with imaging and labs. Patient has declined any imaging at this time, states that he would not want any further treatment if anything else was found. Patient is agreeable to lab check and hydration via his port. Patient denies taking any extra medications or anything he was not supposed to today with intent to harm himself, no prior attempts to harm self. Patient has been on Zoloft for 1 week. Hospice nurses in the department to see patient and will work on plan.   [LM]  1725 Plan is to hydrate and reassess, TTS consult ordered as patient has recently started Zoloft and is feeling suicidal with plan.   [LM]     Clinical Course User Index [LM] Roque Lias   MDM Rules/Calculators/A&P                          Final Clinical Impression(s) / ED Diagnoses Final diagnoses:  None    Rx / DC Orders ED Discharge Orders    None       Tacy Learn, PA-C 08/11/20 1725    Maudie Flakes, MD 08/11/20 [phone]

## 2020-08-11 NOTE — ED Provider Notes (Signed)
7:22 PM Signout from [employer] at shift change.  Patient awaiting TTS consult for suicidal ideation as well as lab work.    9:09 PM labs reviewed.  No acute or new findings.  He has been evaluated by psychiatry and cleared for discharge to home.  BP 118/64   Pulse 71   Temp 97.6 F (36.4 C) (Oral)   Resp 18   Ht 5\' 8"  (1.727 m)   Wt 63.5 kg   SpO2 100%   BMI 21.29 kg/m   I had a good discussion with patient at bedside.  Overall he is feeling improved.  He would like additional dose of pain medication due to residual abdominal pain.  Patient urged to return with worsening symptoms or other concerns. Patient verbalized understanding and agrees with plan.  He is ready for discharge.        Carlisle Cater, PA-C 08/11/20 2155    Maudie Flakes, MD 08/11/20 [phone]

## 2020-08-13 ENCOUNTER — Encounter: Payer: Self-pay | Admitting: Hematology

## 2020-08-14 ENCOUNTER — Encounter: Payer: Self-pay | Admitting: General Practice

## 2020-08-14 ENCOUNTER — Telehealth: Payer: Self-pay | Admitting: Hematology

## 2020-08-14 NOTE — Telephone Encounter (Signed)
Scheduled apt per 11/15 sch msg - pt is aware of appt date and time

## 2020-08-14 NOTE — Progress Notes (Signed)
[facility] Spiritual Care Note  Reached Mr Hyer by phone per referral from Kem Parkinson re his revocation of hospice services. He described his frustrations with hospice as 1) having morphine removed from his prescription list (which upset a pain-control regimen that was working well for him), and 2) unexpectedly long waits for a hospice worker (nurse, etc) to respond in person to his phoned-in requests. He explained that this combination led him to call EMS to take him to [facility] for management of what had become intractable pain. Per Mr Swindell, he named to hospice that he may need their services in the future, but at this time wants his care managed by his familiar team. He also stated that his comment that was interpreted as suicidal ideation was meant to indicate his level of pain and to escalate his care, not to make anyone worry that he would take his own life, for which he states that he has no plan or intention. "I won't make that mistake again," he concludes.  The remainder of our conversation was oriented toward spiritual reflection at his initiative. He notes that he has prepaid his funeral costs, is receiving good support from spouse Fritz Pickerel, has offers of support from his colleagues at [employer], and has had meaningful interactions with a dear neighbor who looks out for him. He is conducting life review of meaningful contributions he has made to and received from others.  We plan to follow up by phone next week.   [place], [place], [facility] Pager [phone] Voicemail [phone]

## 2020-08-18 ENCOUNTER — Encounter: Payer: Self-pay | Admitting: Hematology

## 2020-08-20 ENCOUNTER — Other Ambulatory Visit: Payer: Self-pay | Admitting: Nurse Practitioner

## 2020-08-20 MED ORDER — MIRTAZAPINE 7.5 MG PO TABS
7.5000 mg | ORAL_TABLET | Freq: Every day | ORAL | 1 refills | Status: DC
Start: 2020-08-20 — End: 2020-08-22

## 2020-08-21 ENCOUNTER — Encounter: Payer: Self-pay | Admitting: General Practice

## 2020-08-21 NOTE — Progress Notes (Signed)
[facility] Spiritual Care Note  Reached Mr Rodger by phone for pastoral check-in. He shared and processed how he is doing these days (well overall; less energy, but pain regimen is working well), as well as special memories from college and high school. He reports that a number of friends continue to check on him by phone (even sending flowers!), and Fritz Pickerel is arranging FMLA to spend more time helping him.  We plan to connect in person following his appointment with Lacie Burton/NP on 12/1.  [place], [place], [facility] Pager [phone] Voicemail [phone]

## 2020-08-22 ENCOUNTER — Other Ambulatory Visit: Payer: Self-pay | Admitting: Nurse Practitioner

## 2020-08-22 MED ORDER — MIRTAZAPINE 15 MG PO TABS: 15.0000 mg | ORAL_TABLET | Freq: Every day | ORAL | 1 refills | Status: AC

## 2020-08-29 NOTE — Progress Notes (Addendum)
[place]   [phone] [phone]   Clinic Follow up Note   Patient Care Team: [name], DO as PCP - General (Family Medicine) [name], MD as Consulting Physician (Hematology) Kyung Rudd, MD as Consulting Physician (Radiation Oncology) Hayden Pedro, PA-C as Physician Assistant (Radiation Oncology) 08/30/2020  CHIEF COMPLAINT: Follow-up extrahepatic cholangiocarcinoma  SUMMARY OF ONCOLOGIC HISTORY: Oncology History Overview Note  Cancer Staging Cholangiocarcinoma [facility]) Staging form: Perihilar Bile Ducts, AJCC 8th Edition - Clinical: Stage Unknown (cTX, cN0, cM0) - Signed by [name], MD on 03/19/2019    Cholangiocarcinoma ([place])  01/14/2019 Imaging   US Abdomen 01/14/19  IMPRESSION: There is significant intrahepatic and extrahepatic biliary dilatation present, concerning for distal common bile duct obstruction. Further evaluation with CT scan or MRCP is recommended. Correlation with liver function tests is recommended as well. These results will be called to the ordering clinician or representative by the Radiologist Assistant, and communication documented in the PACS or zVision Dashboard.   Probable large amount of sludge seen within gallbladder lumen with mild gallbladder wall thickening. However, no cholelithiasis, pericholecystic fluid or sonographic Murphy's sign is noted.   4.2 cm septated cyst seen in upper pole of right kidney consistent with Bosniak type 2 lesion. Follow-up ultrasound in 1 year is recommended to ensure stability.   01/20/2019 Imaging   MRI abdomen 01/20/19  IMPRESSION: 1. Findings are highly concerning for central tumor in the biliary tract at the confluence of the common hepatic duct, cystic duct and common bile ducts. Further clinical evaluation for potential cholangiocarcinoma is strongly recommended. 2. Mild ductal dilatation of the main pancreatic duct throughout the distal body and tail  of the pancreas where there is also some associated side branch ectasia. This may suggest a pancreatic ductal stricture. No obstructing pancreatic neoplasm identified. 3. Aortic atherosclerosis.   01/21/2019 Initial Biopsy   Diagnosis 01/21/19  BILE DUCT BRUSHING (SPECIMEN 1 OF 1 COLLECTED 01/21/2019) ADENOCARCINOMA.   01/21/2019 Procedure   ERCP By Dr hung 01/21/19 IMRPESSION - The major papilla appeared normal. - A single localized biliary stricture was found in the middle third of the main bile duct. - The entire main bile duct and upper third of the main bile duct were dilated, secondary to a stricture. - A biliary sphincterotomy was performed. - Cells for cytology obtained in the middle third of the main bile duct. - One plastic stent was placed into the common bile duct.  EUS by Dr hung 01/21/19  IMPRESSION - There was dilation in the middle third of the main bile duct and in the upper third of the main bile duct which measured up to 15 mm. - There was a suggestion of a stricture in the middle third of the main bile duct. - No specimens collected.   02/03/2019 Imaging   CT CAP at [facility] 02/03/19  Impression:  1. There is mild intrahepatic and extrahepatic biliary ductal dilation and diffuse common bile duct wall thickening with stent in place. There is abnormal soft tissue measuring approximately 1.6 cm between the common hepatic artery, portal vein and common bile duct, worrisome for the known cholangiocarcinoma.  2. Periportal lymphadenopathy which may represent nodal metastasis.  4. Marked pancreatic atrophy and duct dilation involving the distal body and tail the pancreas where there is a coarse calcification. These findings are favored to represent stenosis from intraductal stones though an underlying stricture cannot be completely excluded.  5. Atypical symmetric prominent fat stranding of the bilateral lower  abdominal wall. Correlate with surgical history or history of  history of trauma.   03/08/2019 Initial Diagnosis   Cholangiocarcinoma ([place])   03/12/2019 - 04/14/2020 Chemotherapy   Gemcitabine and cisplatin on days 1 and 8 every 21 days starting 03/12/19. Abraxane added with cycle 2. Added GCSF ([name]) to day 9 starting cycle 2.  Abraxane stopped after C4 and chemo held 06/03/19-07/02/19 due to poor toleration/hospitalization. Cisplatin removed starting from cycle 13 due to renal insufficiency. Single agent Gemcitabine held since 04/14/20 to proceed with Target Radiation and chemo break, due to possible disease progression.    03/19/2019 Cancer Staging   Staging form: Perihilar Bile Ducts, AJCC 8th Edition - Clinical: Stage Unknown (cTX, cN0, cM0) - Signed by [name], MD on 03/19/2019   05/24/2019 Imaging   CT CAP W Contrast [facility]  1.  Interval placement of covered metallic biliary stent. No progressive dilation of the biliary tree and resulting resolution of the prior main pancreatic duct dilation. 2.  Evidence of some subtle vascular contour deformity involving the hepatic artery, suspected to be from tumoral contact and/or posttreatment changes. 3.  New small right pleural effusion with overlying mild airspace disease. Secondary findings which may indicate a component of aspiration. 4.  No convincing evidence of new disseminated metastatic disease. 5.  Additional findings as discussed above.   06/18/2019 Imaging   CT AP IMPRESSION: 1.  No acute findings in the abdomen/pelvis. 2. Biliary stent in adequate position. No evidence of focal mass or adenopathy in the region of the pancreatic head or porta hepatis. 3. Moderate size right pleural effusion with associated right basilar atelectasis. 4. Possible single gallstone. Stable subcentimeter liver cyst. Stable right renal cysts. Small left inguinal hernia containing only peritoneal fat. 5. Aortic Atherosclerosis (ICD10-I70.0).   07/28/2019 Imaging   CT CAP IMPRESSION: 1. The patient is status post  common bile duct stent placement, with unchanged stent position and patent appearance with pneumobilia. Primary cholangiocarcinoma is not discretely appreciated by CT. 2. No direct evidence of metastatic disease in the chest, abdomen, or pelvis. 3. Moderate right pleural effusion with associated atelectasis or consolidation, slightly improved compared to prior examination. 4. The pancreatic parenchyma is atrophic and calcified, particularly in the pancreatic tail. 5.  Coronary artery disease.  Aortic atherosclerosis   10/20/2019 PET scan   IMPRESSION: 1. Redemonstrated post treatment and post stenting appearance of a central cholangiocarcinoma, which is not directly appreciated by CT. Common bile duct stent remains in position with post stenting pneumobilia. 2. Slight interval increase in a moderate to large right pleural effusion with associated atelectasis or consolidation. There may be some pleural nodularity about the right hemidiaphragm which appears new compared to prior examination (series 2, image 40), highly suspicious for pleural metastatic disease. 3. No direct evidence of metastatic disease in the chest, abdomen, or pelvis. 4. Coronary artery disease.  Aortic Atherosclerosis (ICD10-I70.0).     10/29/2019 Pathology Results   Thoracentesis FINAL MICROSCOPIC DIAGNOSIS:  - Reactive mesothelial cells present    11/17/2019 PET scan   IMPRESSION: 1. Low-level hypermetabolism within the right pleural space, without focal abnormality to strongly suggest pleural metastasis. 2. Hypermetabolism along the course of the common duct stent, nonspecific and most likely reactive. 3. Otherwise, no evidence of hypermetabolic metastasis. 4. Small to moderate right pleural effusion with developing loculation superiorly. 5. Coronary artery atherosclerosis. Aortic Atherosclerosis (ICD10-I70.0). 6.  Possible constipation.     02/03/2020 Imaging   CT CAP IMPRESSION: 1. Stable indistinct  circumferential biliary  wall thickening in the proximal CBD, without discrete biliary mass. Stable well-positioned CBD stent with intrahepatic pneumobilia indicating patency. 2. No findings of metastatic disease in the abdomen or pelvis. 3. Small dependent right pleural effusion is decreased. Mild diffuse smooth right pleural thickening is similar to slightly increased, nonspecific, cannot exclude malignant pleural effusion. No discrete pleural nodularity. 4. Tiny clustered tree-in-bud type nodules in the anterior left upper lobe, slightly increased, nonspecific, more likely inflammatory. Recommend attention on follow-up chest CT in 3 months. 5. Chronic findings include: Chronic pancreatitis. Coronary atherosclerosis. Aortic Atherosclerosis (ICD10-I70.0).     02/21/2020 Imaging   US Abdomen  IMPRESSION: Mildly distended gallbladder without visible wall thickening or stones.   Common bile duct stent in place. Slight prominence of intrahepatic biliary ducts in the left hepatic lobe.   Increased echotexture of the liver suggesting fatty infiltration.   04/12/2020 Imaging   CT CAP W contrast  IMPRESSION: 1. Enlarging periportal lymph nodes and new duodenal wall thickening are worrisome for disease progression. An infectious or inflammatory process is not excluded. Soft tissue thickening surrounding the lower common bile duct with wall stent in place, stable. 2. Chronic-appearing moderate right pleural effusion, stable. 3. Stable pulmonary nodularity. Continued attention on follow-up is recommended. 4. Fair amount of stool in the colon is indicative of constipation. 5. Chronic pancreatitis. 6. Aortic atherosclerosis (ICD10-I70.0). Coronary artery calcification.     CURRENT THERAPY: Recently requested to be discharged from hospice  INTERVAL HISTORY: Mr. Kudrna returns for follow-up as scheduled.  He last received chemo on 03/23/2020 with single agent gemcitabine.  He had been on  supportive care, last seen by Dr. Burr Medico in clinic on 07/13/2020.  He enrolled in hospice on 08/02/2020 but sent a message stating it "did not work out" on 11/15 and requested to be discharged to resume clinic appointments. He states at first there were insurance issues and miscommunications with hospice. One day couple weeks ago he had n/v and pain and needed hospice nurse visit, they could not get to him, eventually called EMS and was brought to ED. He was found to have SI with a plan but later cleared by psych. Symptoms improved and he was discharged home.   He presents by himself. His mood has improved. Spouse spends more time with him now and helps with errands. N/v resolved. Abdominal pain is 5/10. Takes MS contin 15 mg q8 and oxy-IR up to 3 times per day. He is eating and drinking, appetite fair on remeron 15 mg. He runs errands, drives, and does things around the house. No recent fever, chills, cough, chest pain, dyspnea, leg edema, dark urine or other concerns.     MEDICAL HISTORY:  Past Medical History:  Diagnosis Date  . Diabetes mellitus type I, controlled ([place])   . Dyslipidemia   . ED (erectile dysfunction)   . Essential hypertension   . Gilbert's disease   . Hepatitis B carrier [facility])     SURGICAL HISTORY: Past Surgical History:  Procedure Laterality Date  . APPENDECTOMY  1964  . BILIARY BRUSHING  01/21/2019   Procedure: BILIARY BRUSHING;  Surgeon: Carol Ada, MD;  Location: [facility];  Service: Endoscopy;;  . BILIARY STENT PLACEMENT N/A 01/21/2019   Procedure: BILIARY STENT PLACEMENT;  Surgeon: Carol Ada, MD;  Location: [facility];  Service: Endoscopy;  Laterality: N/A;  . ENDOSCOPIC RETROGRADE CHOLANGIOPANCREATOGRAPHY (ERCP) WITH PROPOFOL N/A 01/21/2019   Procedure: ENDOSCOPIC RETROGRADE CHOLANGIOPANCREATOGRAPHY (ERCP) WITH PROPOFOL;  Surgeon: Carol Ada, MD;  Location: [facility];  Service: Endoscopy;  Laterality: N/A;  . ESOPHAGOGASTRODUODENOSCOPY (EGD) WITH  PROPOFOL N/A 01/21/2019   Procedure: ESOPHAGOGASTRODUODENOSCOPY (EGD) WITH PROPOFOL;  Surgeon: Carol Ada, MD;  Location: [facility];  Service: Endoscopy;  Laterality: N/A;  . FEMORAL HERNIA REPAIR    . IR CHOLANGIOGRAM EXISTING TUBE  05/13/2019  . IR CHOLANGIOGRAM EXISTING TUBE  06/08/2019  . IR EXCHANGE BILIARY DRAIN  05/18/2019  . IR EXCHANGE BILIARY DRAIN  06/08/2019  . IR IMAGING GUIDED PORT INSERTION  03/08/2019  . IR PERC CHOLECYSTOSTOMY  05/04/2019  . IR THORACENTESIS ASP PLEURAL SPACE W/IMG GUIDE  10/29/2019  . LEFT HEART CATH AND CORONARY ANGIOGRAPHY N/A 05/19/2017   Procedure: LEFT HEART CATH AND CORONARY ANGIOGRAPHY;  Surgeon: Leonie Man, MD;  Location: [place] CV LAB;  Service: Cardiovascular;  Laterality: N/A;  . SPHINCTEROTOMY  01/21/2019   Procedure: SPHINCTEROTOMY;  Surgeon: Carol Ada, MD;  Location: [facility];  Service: Endoscopy;;  . UPPER ESOPHAGEAL ENDOSCOPIC ULTRASOUND (EUS) N/A 01/21/2019   Procedure: UPPER ESOPHAGEAL ENDOSCOPIC ULTRASOUND (EUS);  Surgeon: Carol Ada, MD;  Location: [facility];  Service: Endoscopy;  Laterality: N/A;    I have reviewed the social history and family history with the patient and they are unchanged from previous note.  ALLERGIES:  is allergic to erythromycin.  MEDICATIONS:  Current Outpatient Medications  Medication Sig Dispense Refill  . amLODipine (NORVASC) 5 MG tablet Take 5 mg by mouth daily.     Marland Kitchen atenolol (TENORMIN) 25 MG tablet Take 25 mg by mouth daily. Once a day     . Blood Glucose Monitoring Suppl (ACCU-CHEK NANO SMARTVIEW) W/DEVICE KIT 1 kit by Does not apply route 2 (two) times daily. (Patient taking differently: 1 kit by Does not apply route See admin instructions. Test blood sugars 12x's daily) 1 kit 0  . glucose blood test strip Test 3 times a day. (Patient taking differently: 1 each by Other route See admin instructions. Test 12 times a day.) 300 each Prn  . insulin NPH Human (NOVOLIN N) 100 UNIT/ML  injection Inject 20 Units into the skin 2 (two) times daily before a meal.     . insulin regular (NOVOLIN R) 100 units/mL injection Inject 20 Units into the skin 2 (two) times daily before a meal.     . mirtazapine (REMERON) 15 MG tablet Take 1 tablet (15 mg total) by mouth at bedtime. 60 tablet 1  . oxycodone (OXY-IR) 5 MG capsule Take 1-2 capsules (5-10 mg total) by mouth every 6 (six) hours as needed for pain (severe pain). 90 capsule 0  . morphine (MS CONTIN) 30 MG 12 hr tablet Take 1 tablet (30 mg total) by mouth every 12 (twelve) hours. 60 tablet 0  . ondansetron (ZOFRAN) 8 MG tablet TAKE 1 TABLET BY MOUTH 2 TIMES DAY AS NEEDED. START ON 3RD DAY AFTER CHEMOTHERAPY (Patient not taking: Reported on 08/30/2020) 30 tablet 1   No current facility-administered medications for this visit.   Facility-Administered Medications Ordered in Other Visits  Medication Dose Route Frequency Provider Last Rate Last Admin  . 0.9 %  sodium chloride infusion (Manually program via Guardrails IV Fluids)  250 mL Intravenous Once [name], MD        PHYSICAL EXAMINATION: ECOG PERFORMANCE STATUS: 2 - Symptomatic, <50% confined to bed  Vitals:   08/30/20 1123  BP: 134/78  Pulse: 81  Resp: 18  Temp: 97.8 F (36.6 C)  SpO2: 98%   Filed Weights   08/30/20 1123  Weight: 138  lb 8 oz (62.8 kg)    GENERAL:alert, no distress and comfortable SKIN: mildly juandiced EYES:  sclera clear NECK: without mass LUNGS: decreased, with normal breathing effort HEART: regular rate & rhythm, no lower extremity edema ABDOMEN:abdomen mildly distended, soft, non-tender and normal bowel sounds NEURO: alert & oriented x 3 with fluent speech, no focal motor/sensory deficits PAC without erythema    LABORATORY DATA:  I have reviewed the data as listed CBC Latest Ref Rng & Units 08/30/2020 08/11/2020 07/13/2020  WBC 4.0 - 10.5 K/uL 6.7 11.3(H) 5.7  Hemoglobin 13.0 - 17.0 g/dL 8.8(L) 9.2(L) 8.9(L)  Hematocrit 39 - 52 %  26.3(L) 27.3(L) 27.0(L)  Platelets 150 - 400 K/uL 176 172 155     CMP Latest Ref Rng & Units 08/30/2020 08/11/2020 07/13/2020  Glucose 70 - 99 mg/dL 97 116(H) 187(H)  BUN 8 - 23 mg/dL 24(H) 29(H) 24(H)  Creatinine 0.61 - 1.24 mg/dL 1.20 1.14 1.41(H)  Sodium 135 - 145 mmol/L 132(L) 135 135  Potassium 3.5 - 5.1 mmol/L 4.0 3.2(L) 4.0  Chloride 98 - 111 mmol/L 102 104 105  CO2 22 - 32 mmol/L _0 Calcium 8.9 - 10.3 mg/dL 9.2 9.1 9.5  Total Protein 6.5 - 8.1 g/dL 6.9 7.2 7.0  Total Bilirubin 0.3 - 1.2 mg/dL 1.3(H) 1.2 0.8  Alkaline Phos 38 - 126 U/L 265(H) 140(H) 149(H)  AST 15 - 41 U/L 48(H) 68(H) 52(H)  ALT 0 - 44 U/L 67(H) 82(H) 62(H)      RADIOGRAPHIC STUDIES: I have personally reviewed the radiological images as listed and agreed with the findings in the report. No results found.   ASSESSMENT & PLAN: Christopher Welch a 74y.o.malewith   1. Extrahepatic cholangiocarcinoma -He wasdiagnosedin 12/2018; presented withmalignant stricture and CBD obstruction required stenting. Brushing revealed adenocarcinoma. He underwent attempted whipple per Dr[facility] Zenia Resides [facility] which was aborted due to vascular invasion -He initially started first-line gemcitabine, cisplatin, Abraxane in 02/2020, reduced to single agent gemcitabine after cycle 13 due to poor tolerance and eventually stopped in 04/14/2020 with chemo break.  -Dr. Carlis Abbott did not recommend surgery -CT from 04/12/2020 showed enlarging periportal lymph nodes and new duodenal wall thickening, worrisome for disease progression first inflammation.  He declined biopsy and targeted radiation -Patient clearly wanted to shift the focus to quality of life and supportive care rather than prolonging his life. -He elected hospice in November but requested to be discharged 2 weeks later  2.  Abdominal pain, low appetite, N/V -Secondary to #1 -Previously on MS Contin 15 mg 3 times daily, increased today 30 mg twice daily -Also has OxyIR,  Compazine, and Zofran for supportive care  3. DM, CAD, HL, HTN -managed by PCP Dr. Theda Sers and cardiology Dr. Ellyn Hack  4. Social support -he is married now, no children, he lives alone. -Since he was recently seen in ED with SI and a plan, his spouse now spends the night with him a few nights per week and checks on him more often -followed by SW -Mood is improved today, on mirtazapine.  Tried Zoloft but did not tolerate due to GI upset.  Declined additional antidepressant today -Seen by chaplain Lorrin Jackson today  Disposition: Mr. Huckaba appears stable.  He was recently discharged from hospice.  He continues to have abdominal pain and intermittent nausea/vomiting.  We are adjusting his pain medication today.  His performance status is otherwise good.   Reviewed the CBC and CMP from today, Hg 8.8, LFTs fluctuate, T bili  1.3.   His goal of care remains to focus on quality of life, not to prolong his life.  He is clinically stable.  We are recommending to continue observation for now.  Patient seen with Dr. Burr Medico.  Return in a month.  He knows to call sooner with any new or worsening problems.  All questions were answered. The patient knows to call the clinic with any problems, questions or concerns. No barriers to learning were detected.     Alla Feeling, NP 08/30/20   Addendum  I have seen the patient, examined him. I agree with the assessment and and plan and have edited the notes.   Pt has withdraw from hospice but still lives independently. We reviewed symptom management.  I recommend increase MS Contin to 30 mg every 12 hours for better pain control.  Patient is thinking about cancer treatment again, but clearly stated he does not want to prolong his life.  We reviewed the option of palliative radiation, versus low-dose chemotherapy, such as Xeloda.  We decided to hold any treatment at this point, he will continue follow-up every 3 to 4-week in our clinic for symptom management  and supportive care.  All questions answered.  [name]  08/30/2020

## 2020-08-30 ENCOUNTER — Encounter: Payer: Self-pay | Admitting: Nurse Practitioner

## 2020-08-30 ENCOUNTER — Encounter: Payer: Self-pay | Admitting: General Practice

## 2020-08-30 ENCOUNTER — Inpatient Hospital Stay: Payer: Medicare Other

## 2020-08-30 ENCOUNTER — Other Ambulatory Visit: Payer: Self-pay

## 2020-08-30 ENCOUNTER — Inpatient Hospital Stay: Payer: Medicare Other | Attending: Hematology | Admitting: Nurse Practitioner

## 2020-08-30 VITALS — BP 134/78 | HR 81 | Temp 97.8°F | Resp 18 | Ht 68.0 in | Wt 138.5 lb

## 2020-08-30 DIAGNOSIS — R634 Abnormal weight loss: Secondary | ICD-10-CM | POA: Diagnosis not present

## 2020-08-30 DIAGNOSIS — N183 Chronic kidney disease, stage 3 unspecified: Secondary | ICD-10-CM | POA: Diagnosis not present

## 2020-08-30 DIAGNOSIS — C221 Intrahepatic bile duct carcinoma: Secondary | ICD-10-CM | POA: Diagnosis not present

## 2020-08-30 DIAGNOSIS — I2510 Atherosclerotic heart disease of native coronary artery without angina pectoris: Secondary | ICD-10-CM | POA: Diagnosis not present

## 2020-08-30 DIAGNOSIS — Z95828 Presence of other vascular implants and grafts: Secondary | ICD-10-CM

## 2020-08-30 DIAGNOSIS — G893 Neoplasm related pain (acute) (chronic): Secondary | ICD-10-CM | POA: Diagnosis not present

## 2020-08-30 DIAGNOSIS — I1310 Hypertensive heart and chronic kidney disease without heart failure, with stage 1 through stage 4 chronic kidney disease, or unspecified chronic kidney disease: Secondary | ICD-10-CM | POA: Insufficient documentation

## 2020-08-30 DIAGNOSIS — D649 Anemia, unspecified: Secondary | ICD-10-CM | POA: Diagnosis not present

## 2020-08-30 DIAGNOSIS — J90 Pleural effusion, not elsewhere classified: Secondary | ICD-10-CM | POA: Diagnosis not present

## 2020-08-30 LAB — CBC WITH DIFFERENTIAL (CANCER CENTER ONLY)
Abs Immature Granulocytes: 0.02 10*3/uL (ref 0.00–0.07)
Basophils Absolute: 0 10*3/uL (ref 0.0–0.1)
Basophils Relative: 0 %
Eosinophils Absolute: 0.1 10*3/uL (ref 0.0–0.5)
Eosinophils Relative: 2 %
HCT: 26.3 % — ABNORMAL LOW (ref 39.0–52.0)
Hemoglobin: 8.8 g/dL — ABNORMAL LOW (ref 13.0–17.0)
Immature Granulocytes: 0 %
Lymphocytes Relative: 23 %
Lymphs Abs: 1.5 10*3/uL (ref 0.7–4.0)
MCH: 28.9 pg (ref 26.0–34.0)
MCHC: 33.5 g/dL (ref 30.0–36.0)
MCV: 86.5 fL (ref 80.0–100.0)
Monocytes Absolute: 1 10*3/uL (ref 0.1–1.0)
Monocytes Relative: 14 %
Neutro Abs: 4.1 10*3/uL (ref 1.7–7.7)
Neutrophils Relative %: 61 %
Platelet Count: 176 10*3/uL (ref 150–400)
RBC: 3.04 MIL/uL — ABNORMAL LOW (ref 4.22–5.81)
RDW: 16.2 % — ABNORMAL HIGH (ref 11.5–15.5)
WBC Count: 6.7 10*3/uL (ref 4.0–10.5)
nRBC: 0 % (ref 0.0–0.2)

## 2020-08-30 LAB — CMP (CANCER CENTER ONLY)
ALT: 67 U/L — ABNORMAL HIGH (ref 0–44)
AST: 48 U/L — ABNORMAL HIGH (ref 15–41)
Albumin: 2.9 g/dL — ABNORMAL LOW (ref 3.5–5.0)
Alkaline Phosphatase: 265 U/L — ABNORMAL HIGH (ref 38–126)
Anion gap: 6 (ref 5–15)
BUN: 24 mg/dL — ABNORMAL HIGH (ref 8–23)
CO2: 24 mmol/L (ref 22–32)
Calcium: 9.2 mg/dL (ref 8.9–10.3)
Chloride: 102 mmol/L (ref 98–111)
Creatinine: 1.2 mg/dL (ref 0.61–1.24)
GFR, Estimated: >60 mL/min (ref >60)
Glucose, Bld: 97 mg/dL (ref 70–99)
Potassium: 4 mmol/L (ref 3.5–5.1)
Sodium: 132 mmol/L — ABNORMAL LOW (ref 135–145)
Total Bilirubin: 1.3 mg/dL — ABNORMAL HIGH (ref 0.3–1.2)
Total Protein: 6.9 g/dL (ref 6.5–8.1)

## 2020-08-30 LAB — SAMPLE TO BLOOD BANK

## 2020-08-30 MED ORDER — HEPARIN SOD (PORK) LOCK FLUSH 100 UNIT/ML IV SOLN
500.0000 [IU] | Freq: Once | INTRAVENOUS | Status: AC | PRN
  Administered 2020-08-30: 500 [IU]
  Filled 2020-08-30: qty 5

## 2020-08-30 MED ORDER — MORPHINE SULFATE ER 30 MG PO TBCR
30.0000 mg | EXTENDED_RELEASE_TABLET | Freq: Two times a day (BID) | ORAL | 0 refills | Status: AC
Start: 2020-08-30 — End: ?

## 2020-08-30 MED ORDER — SODIUM CHLORIDE 0.9% FLUSH
10.0000 mL | INTRAVENOUS | Status: DC | PRN
  Administered 2020-08-30: 10 mL
  Filled 2020-08-30: qty 10

## 2020-08-30 NOTE — Progress Notes (Signed)
[place] Spiritual Care Note  Met with Mr Edelson in person after his appointment today. 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 He notes that his goal continues to be quality over quantity. He continues to be so pleased by and grateful for his care here at [facility]. We plan to follow up by phone for a pastoral check-in in the coming weeks.   [place], [place], [facility] Pager [phone] Voicemail [phone]

## 2020-08-30 NOTE — Patient Instructions (Signed)

## 2020-09-06 ENCOUNTER — Emergency Department (HOSPITAL_COMMUNITY): Payer: Medicare Other

## 2020-09-06 ENCOUNTER — Encounter (HOSPITAL_COMMUNITY): Payer: Self-pay | Admitting: Emergency Medicine

## 2020-09-06 ENCOUNTER — Inpatient Hospital Stay (HOSPITAL_COMMUNITY)
Admission: EM | Admit: 2020-09-06 | Discharge: 2020-09-30 | DRG: 871 | Disposition: E | Payer: Medicare Other | Attending: Internal Medicine | Admitting: Internal Medicine

## 2020-09-06 ENCOUNTER — Other Ambulatory Visit: Payer: Self-pay

## 2020-09-06 DIAGNOSIS — N179 Acute kidney failure, unspecified: Secondary | ICD-10-CM | POA: Diagnosis present

## 2020-09-06 DIAGNOSIS — R410 Disorientation, unspecified: Secondary | ICD-10-CM | POA: Diagnosis not present

## 2020-09-06 DIAGNOSIS — I10 Essential (primary) hypertension: Secondary | ICD-10-CM | POA: Diagnosis not present

## 2020-09-06 DIAGNOSIS — C221 Intrahepatic bile duct carcinoma: Secondary | ICD-10-CM | POA: Diagnosis not present

## 2020-09-06 DIAGNOSIS — R4182 Altered mental status, unspecified: Secondary | ICD-10-CM | POA: Diagnosis not present

## 2020-09-06 DIAGNOSIS — E1165 Type 2 diabetes mellitus with hyperglycemia: Secondary | ICD-10-CM | POA: Diagnosis not present

## 2020-09-06 DIAGNOSIS — J189 Pneumonia, unspecified organism: Secondary | ICD-10-CM | POA: Diagnosis not present

## 2020-09-06 DIAGNOSIS — R404 Transient alteration of awareness: Secondary | ICD-10-CM | POA: Diagnosis not present

## 2020-09-06 DIAGNOSIS — Z888 Allergy status to other drugs, medicaments and biological substances status: Secondary | ICD-10-CM

## 2020-09-06 DIAGNOSIS — D638 Anemia in other chronic diseases classified elsewhere: Secondary | ICD-10-CM | POA: Diagnosis present

## 2020-09-06 DIAGNOSIS — K838 Other specified diseases of biliary tract: Secondary | ICD-10-CM | POA: Diagnosis not present

## 2020-09-06 DIAGNOSIS — I959 Hypotension, unspecified: Secondary | ICD-10-CM | POA: Diagnosis not present

## 2020-09-06 DIAGNOSIS — K3189 Other diseases of stomach and duodenum: Secondary | ICD-10-CM | POA: Diagnosis not present

## 2020-09-06 DIAGNOSIS — J90 Pleural effusion, not elsewhere classified: Secondary | ICD-10-CM | POA: Diagnosis not present

## 2020-09-06 DIAGNOSIS — E1122 Type 2 diabetes mellitus with diabetic chronic kidney disease: Secondary | ICD-10-CM | POA: Diagnosis present

## 2020-09-06 DIAGNOSIS — Z8509 Personal history of malignant neoplasm of other digestive organs: Secondary | ICD-10-CM | POA: Diagnosis not present

## 2020-09-06 DIAGNOSIS — R7989 Other specified abnormal findings of blood chemistry: Secondary | ICD-10-CM | POA: Diagnosis present

## 2020-09-06 DIAGNOSIS — Z20822 Contact with and (suspected) exposure to covid-19: Secondary | ICD-10-CM | POA: Diagnosis not present

## 2020-09-06 DIAGNOSIS — Z923 Personal history of irradiation: Secondary | ICD-10-CM

## 2020-09-06 DIAGNOSIS — A419 Sepsis, unspecified organism: Secondary | ICD-10-CM | POA: Diagnosis not present

## 2020-09-06 DIAGNOSIS — K921 Melena: Secondary | ICD-10-CM | POA: Diagnosis present

## 2020-09-06 DIAGNOSIS — E871 Hypo-osmolality and hyponatremia: Secondary | ICD-10-CM | POA: Diagnosis not present

## 2020-09-06 DIAGNOSIS — N1831 Chronic kidney disease, stage 3a: Secondary | ICD-10-CM | POA: Diagnosis present

## 2020-09-06 DIAGNOSIS — Z66 Do not resuscitate: Secondary | ICD-10-CM | POA: Diagnosis not present

## 2020-09-06 DIAGNOSIS — A4159 Other Gram-negative sepsis: Secondary | ICD-10-CM | POA: Diagnosis not present

## 2020-09-06 DIAGNOSIS — E119 Type 2 diabetes mellitus without complications: Secondary | ICD-10-CM

## 2020-09-06 DIAGNOSIS — C240 Malignant neoplasm of extrahepatic bile duct: Secondary | ICD-10-CM | POA: Diagnosis present

## 2020-09-06 DIAGNOSIS — R0603 Acute respiratory distress: Secondary | ICD-10-CM | POA: Diagnosis present

## 2020-09-06 DIAGNOSIS — Z9981 Dependence on supplemental oxygen: Secondary | ICD-10-CM

## 2020-09-06 DIAGNOSIS — R739 Hyperglycemia, unspecified: Secondary | ICD-10-CM | POA: Diagnosis not present

## 2020-09-06 DIAGNOSIS — B181 Chronic viral hepatitis B without delta-agent: Secondary | ICD-10-CM | POA: Diagnosis present

## 2020-09-06 DIAGNOSIS — I129 Hypertensive chronic kidney disease with stage 1 through stage 4 chronic kidney disease, or unspecified chronic kidney disease: Secondary | ICD-10-CM | POA: Diagnosis present

## 2020-09-06 DIAGNOSIS — K861 Other chronic pancreatitis: Secondary | ICD-10-CM | POA: Diagnosis present

## 2020-09-06 DIAGNOSIS — K8689 Other specified diseases of pancreas: Secondary | ICD-10-CM | POA: Diagnosis not present

## 2020-09-06 DIAGNOSIS — I2510 Atherosclerotic heart disease of native coronary artery without angina pectoris: Secondary | ICD-10-CM | POA: Diagnosis present

## 2020-09-06 DIAGNOSIS — E785 Hyperlipidemia, unspecified: Secondary | ICD-10-CM | POA: Diagnosis present

## 2020-09-06 DIAGNOSIS — Z515 Encounter for palliative care: Secondary | ICD-10-CM

## 2020-09-06 DIAGNOSIS — E1140 Type 2 diabetes mellitus with diabetic neuropathy, unspecified: Secondary | ICD-10-CM | POA: Diagnosis not present

## 2020-09-06 DIAGNOSIS — C782 Secondary malignant neoplasm of pleura: Secondary | ICD-10-CM | POA: Diagnosis present

## 2020-09-06 DIAGNOSIS — D649 Anemia, unspecified: Secondary | ICD-10-CM | POA: Diagnosis not present

## 2020-09-06 DIAGNOSIS — R54 Age-related physical debility: Secondary | ICD-10-CM | POA: Diagnosis present

## 2020-09-06 DIAGNOSIS — Z794 Long term (current) use of insulin: Secondary | ICD-10-CM | POA: Diagnosis not present

## 2020-09-06 DIAGNOSIS — G893 Neoplasm related pain (acute) (chronic): Secondary | ICD-10-CM | POA: Diagnosis present

## 2020-09-06 DIAGNOSIS — Z833 Family history of diabetes mellitus: Secondary | ICD-10-CM

## 2020-09-06 DIAGNOSIS — R6520 Severe sepsis without septic shock: Secondary | ICD-10-CM

## 2020-09-06 DIAGNOSIS — Z79899 Other long term (current) drug therapy: Secondary | ICD-10-CM

## 2020-09-06 DIAGNOSIS — D696 Thrombocytopenia, unspecified: Secondary | ICD-10-CM | POA: Diagnosis present

## 2020-09-06 LAB — RESP PANEL BY RT-PCR (FLU A&B, COVID) ARPGX2
Influenza A by PCR: NEGATIVE
Influenza B by PCR: NEGATIVE
SARS Coronavirus 2 by RT PCR: NEGATIVE

## 2020-09-06 LAB — CBC WITH DIFFERENTIAL/PLATELET
Abs Immature Granulocytes: 0.08 10*3/uL — ABNORMAL HIGH (ref 0.00–0.07)
Basophils Absolute: 0 10*3/uL (ref 0.0–0.1)
Basophils Relative: 0 %
Eosinophils Absolute: 0 10*3/uL (ref 0.0–0.5)
Eosinophils Relative: 0 %
HCT: 27.7 % — ABNORMAL LOW (ref 39.0–52.0)
Hemoglobin: 9.2 g/dL — ABNORMAL LOW (ref 13.0–17.0)
Immature Granulocytes: 1 %
Lymphocytes Relative: 5 %
Lymphs Abs: 0.8 10*3/uL (ref 0.7–4.0)
MCH: 29.7 pg (ref 26.0–34.0)
MCHC: 33.2 g/dL (ref 30.0–36.0)
MCV: 89.4 fL (ref 80.0–100.0)
Monocytes Absolute: 1.7 10*3/uL — ABNORMAL HIGH (ref 0.1–1.0)
Monocytes Relative: 11 %
Neutro Abs: 13.1 10*3/uL — ABNORMAL HIGH (ref 1.7–7.7)
Neutrophils Relative %: 83 %
Platelets: 163 10*3/uL (ref 150–400)
RBC: 3.1 MIL/uL — ABNORMAL LOW (ref 4.22–5.81)
RDW: 16.7 % — ABNORMAL HIGH (ref 11.5–15.5)
WBC: 15.6 10*3/uL — ABNORMAL HIGH (ref 4.0–10.5)
nRBC: 0 % (ref 0.0–0.2)

## 2020-09-06 LAB — COMPREHENSIVE METABOLIC PANEL
ALT: 50 U/L — ABNORMAL HIGH (ref 0–44)
AST: 50 U/L — ABNORMAL HIGH (ref 15–41)
Albumin: 3.1 g/dL — ABNORMAL LOW (ref 3.5–5.0)
Alkaline Phosphatase: 232 U/L — ABNORMAL HIGH (ref 38–126)
Anion gap: 11 (ref 5–15)
BUN: 37 mg/dL — ABNORMAL HIGH (ref 8–23)
CO2: 23 mmol/L (ref 22–32)
Calcium: 8.7 mg/dL — ABNORMAL LOW (ref 8.9–10.3)
Chloride: 96 mmol/L — ABNORMAL LOW (ref 98–111)
Creatinine, Ser: 1.43 mg/dL — ABNORMAL HIGH (ref 0.61–1.24)
GFR, Estimated: 52 mL/min — ABNORMAL LOW (ref >60)
Glucose, Bld: 416 mg/dL — ABNORMAL HIGH (ref 70–99)
Potassium: 4.8 mmol/L (ref 3.5–5.1)
Sodium: 130 mmol/L — ABNORMAL LOW (ref 135–145)
Total Bilirubin: 3.2 mg/dL — ABNORMAL HIGH (ref 0.3–1.2)
Total Protein: 7.3 g/dL (ref 6.5–8.1)

## 2020-09-06 LAB — LACTIC ACID, PLASMA: Lactic Acid, Venous: 1.8 mmol/L (ref 0.5–1.9)

## 2020-09-06 LAB — URINALYSIS, ROUTINE W REFLEX MICROSCOPIC
Bilirubin Urine: NEGATIVE
Glucose, UA: >=500 mg/dL — AB
Hgb urine dipstick: NEGATIVE
Ketones, ur: 20 mg/dL — AB
Leukocytes,Ua: NEGATIVE
Nitrite: NEGATIVE
Protein, ur: NEGATIVE mg/dL
Specific Gravity, Urine: 1.024 (ref 1.005–1.030)
pH: 5 (ref 5.0–8.0)

## 2020-09-06 LAB — PROTIME-INR
INR: 1.6 — ABNORMAL HIGH (ref 0.8–1.2)
Prothrombin Time: 18 seconds — ABNORMAL HIGH (ref 11.4–15.2)

## 2020-09-06 LAB — CBG MONITORING, ED: Glucose-Capillary: 425 mg/dL — ABNORMAL HIGH (ref 70–99)

## 2020-09-06 LAB — MAGNESIUM: Magnesium: 2 mg/dL (ref 1.7–2.4)

## 2020-09-06 LAB — PHOSPHORUS: Phosphorus: 3.1 mg/dL (ref 2.5–4.6)

## 2020-09-06 MED ORDER — INSULIN ASPART 100 UNIT/ML ~~LOC~~ SOLN
0.0000 [IU] | SUBCUTANEOUS | Status: DC
  Administered 2020-09-07: 9 [IU] via SUBCUTANEOUS
  Administered 2020-09-07: 5 [IU] via SUBCUTANEOUS
  Administered 2020-09-07: 2 [IU] via SUBCUTANEOUS
  Administered 2020-09-07: 3 [IU] via SUBCUTANEOUS
  Administered 2020-09-07: 9 [IU] via SUBCUTANEOUS
  Administered 2020-09-08: 7 [IU] via SUBCUTANEOUS
  Administered 2020-09-08: 5 [IU] via SUBCUTANEOUS
  Filled 2020-09-06: qty 0.09

## 2020-09-06 MED ORDER — SODIUM CHLORIDE 0.9 % IV SOLN
2.0000 g | INTRAVENOUS | Status: DC
  Administered 2020-09-07 – 2020-09-08 (×2): 2 g via INTRAVENOUS
  Filled 2020-09-06: qty 20
  Filled 2020-09-06: qty 2

## 2020-09-06 MED ORDER — ACETAMINOPHEN 325 MG PO TABS: 650.0000 mg | ORAL_TABLET | Freq: Four times a day (QID) | ORAL | Status: DC | PRN

## 2020-09-06 MED ORDER — INSULIN GLARGINE 100 UNIT/ML ~~LOC~~ SOLN
10.0000 [IU] | Freq: Every day | SUBCUTANEOUS | Status: DC
  Administered 2020-09-06 – 2020-09-07 (×2): 10 [IU] via SUBCUTANEOUS
  Filled 2020-09-06 (×2): qty 0.1

## 2020-09-06 MED ORDER — IBUPROFEN 200 MG PO TABS
600.0000 mg | ORAL_TABLET | Freq: Once | ORAL | Status: AC
  Administered 2020-09-06: 600 mg via ORAL
  Filled 2020-09-06: qty 3

## 2020-09-06 MED ORDER — SODIUM CHLORIDE 0.9 % IV SOLN
100.0000 mg | Freq: Two times a day (BID) | INTRAVENOUS | Status: DC
  Administered 2020-09-07 – 2020-09-09 (×5): 100 mg via INTRAVENOUS
  Filled 2020-09-06 (×6): qty 100

## 2020-09-06 MED ORDER — OXYCODONE HCL 5 MG PO TABS
5.0000 mg | ORAL_TABLET | Freq: Four times a day (QID) | ORAL | Status: DC | PRN
  Administered 2020-09-08: 10 mg via ORAL
  Filled 2020-09-06 (×3): qty 1

## 2020-09-06 MED ORDER — ACETAMINOPHEN 500 MG PO TABS: 1000.0000 mg | ORAL_TABLET | Freq: Once | ORAL | Status: DC

## 2020-09-06 MED ORDER — PIPERACILLIN-TAZOBACTAM 3.375 G IVPB
3.3750 g | Freq: Once | INTRAVENOUS | Status: AC
  Administered 2020-09-06: 3.375 g via INTRAVENOUS
  Filled 2020-09-06: qty 50

## 2020-09-06 MED ORDER — ACETAMINOPHEN 650 MG RE SUPP: 650.0000 mg | Freq: Four times a day (QID) | RECTAL | Status: DC | PRN

## 2020-09-06 MED ORDER — IOHEXOL 300 MG/ML  SOLN
100.0000 mL | Freq: Once | INTRAMUSCULAR | Status: AC | PRN
  Administered 2020-09-06: 100 mL via INTRAVENOUS

## 2020-09-06 MED ORDER — SODIUM CHLORIDE 0.9 % IV SOLN
75.0000 mL/h | INTRAVENOUS | Status: AC
  Administered 2020-09-07: 75 mL/h via INTRAVENOUS

## 2020-09-06 MED ORDER — MIRTAZAPINE 15 MG PO TABS
15.0000 mg | ORAL_TABLET | Freq: Every day | ORAL | Status: DC
  Administered 2020-09-07 – 2020-09-08 (×3): 15 mg via ORAL
  Filled 2020-09-06: qty 2
  Filled 2020-09-06 (×2): qty 1

## 2020-09-06 MED ORDER — VANCOMYCIN HCL IN DEXTROSE 1-5 GM/200ML-% IV SOLN
1000.0000 mg | Freq: Once | INTRAVENOUS | Status: AC
  Administered 2020-09-06: 1000 mg via INTRAVENOUS
  Filled 2020-09-06: qty 200

## 2020-09-06 MED ORDER — SODIUM CHLORIDE 0.9 % IV SOLN: 2.0000 g | INTRAVENOUS | Status: DC

## 2020-09-06 MED ORDER — ALBUTEROL SULFATE (2.5 MG/3ML) 0.083% IN NEBU: 2.5000 mg | INHALATION_SOLUTION | RESPIRATORY_TRACT | Status: DC | PRN

## 2020-09-06 MED ORDER — MORPHINE SULFATE ER 15 MG PO TBCR
30.0000 mg | EXTENDED_RELEASE_TABLET | Freq: Two times a day (BID) | ORAL | Status: DC
  Administered 2020-09-07 – 2020-09-09 (×6): 30 mg via ORAL
  Filled 2020-09-06 (×6): qty 2

## 2020-09-06 NOTE — ED Triage Notes (Signed)
BIB EMS from home, patient called EMS D/T the fact that he is altered, warm to touch w/ EMS. Denies recent falls or trauma, would stare off and stop answering questions, alert and oriented x4.

## 2020-09-06 NOTE — H&P (Signed)
ETAI COPADO IWO:[id number] DOB: May 09, 1946 DOA: 09/16/2020     PCP: [name], DO   Outpatient Specialists:   CARDS:  Dr. Ellyn Hack    Oncology  Dr. Burr Medico    Patient arrived to ER on 09/10/2020 at 1715 Referred by Attending Veryl Speak, MD   Patient coming from: home Lives With family husband   Chief Complaint:  Chief Complaint  Patient presents with  . Altered Mental Status    HPI: Christopher Welch is a 74 y.o. male with medical history significant of metastatic cholangiocarcinoma inoperable, diabetes, hypertension chronic pancreatitis CAD  Presented with fever up to 102.1 denies any cough or shortness of breath no abdominal pain no diarrhea He is fully vaccinated for [facility] cholangiocarcinoma status post radiation treatment and stenting Pleural metastatic disease chronic right pleural effusion Last chemo was June 2021 with gemcitabine supportive treatment currently DNR/DNI he discontinued treatment for his cancer In November was on hospice but then was discharged Has chronic intermittent abdominal pain and nausea vomiting Has been taking his pain medication Infectious risk factors:  Reports  Fever,     Has  been vaccinated against COVID    Initial COVID TEST  NEGATIVE   Lab Results  Component Value Date   [place] 09/27/2020   [place] NEGATIVE 08/11/2020   [place] Not Detected 12/24/2019   [place] NEGATIVE 10/27/2019     Regarding pertinent Chronic problems:    Hyperlipidemia -not on on statins Lipid Panel     Component Value Date/Time   CHOL 108 12/05/2011 1117   TRIG 53 12/05/2011 1117   HDL 51 12/05/2011 1117   CHOLHDL 2.1 12/05/2011 1117   VLDL 11 12/05/2011 1117   LDLCALC 46 12/05/2011 1117     HTN on atenolol Norvasc    DM 1-  Lab Results  Component Value Date   HGBA1C 7.5 (H) 05/05/2019    on insulin Novolin regular 20 twice daily prior to meals   Liver disease MELD-Na score: 24 at 08/31/2020  6:15  PM     Chronic anemia - baseline hg Hemoglobin & Hematocrit  Recent Labs    08/11/20 1627 08/30/20 1115 09/15/2020 1815  HGB 9.2* 8.8* 9.2*   While in ER: Blood cultures obtained UA done showed no evidence of UTI Covid negative Chest x-ray CT scan of abdomen pelvis done See CT showed developing pneumonia in the lung  basis    Hospitalist was called for admission for sepsis due to pneumonia  The following Work up has been ordered so far:  Orders Placed This Encounter  Procedures  . Culture, blood (Routine x 2)  . Resp Panel by RT-PCR (Flu A&B, Covid) Nasopharyngeal Swab  . DG Chest Port 1 View  . CT ABDOMEN PELVIS W CONTRAST  . Comprehensive metabolic panel  . Lactic acid, plasma  . CBC with Differential  . Protime-INR  . Urinalysis, Routine w reflex microscopic  . Notify Physician if pt is possible Sepsis patient  . Document height and weight  . Consult to hospitalist  ALL PATIENTS BEING ADMITTED/HAVING PROCEDURES NEED COVID-19 SCREENING  . Airborne and Contact precautions     Following Medications were ordered in ER: Medications  vancomycin (VANCOCIN) IVPB 1000 mg/200 mL premix (1,000 mg Intravenous New Bag/Given 09/05/2020 2156)  piperacillin-tazobactam (ZOSYN) IVPB 3.375 g (has no administration in time range)  ibuprofen (ADVIL) tablet 600 mg (600 mg Oral Given 08/30/2020 1939)  iohexol (OMNIPAQUE) 300 MG/ML solution 100 mL (100 mLs Intravenous Contrast  Given 09/23/2020 2035)        Consult Orders  (From admission, onward)         Start     Ordered   09/12/2020 2200  Consult to hospitalist  ALL PATIENTS BEING ADMITTED/HAVING PROCEDURES NEED COVID-19 SCREENING  Once       Comments: ALL PATIENTS BEING ADMITTED/HAVING PROCEDURES NEED COVID-19 SCREENING  Provider:  (Not yet assigned)  Question Answer Comment  Place call to: [facility]   Reason for Consult Admit      09/20/2020 2159          Significant initial  Findings: Abnormal Labs Reviewed  COMPREHENSIVE  METABOLIC PANEL - Abnormal; Notable for the following components:      Result Value   Sodium 130 (*)    Chloride 96 (*)    Glucose, Bld 416 (*)    BUN 37 (*)    Creatinine, Ser 1.43 (*)    Calcium 8.7 (*)    Albumin 3.1 (*)    AST 50 (*)    ALT 50 (*)    Alkaline Phosphatase 232 (*)    Total Bilirubin 3.2 (*)    GFR, Estimated 52 (*)    All other components within normal limits  CBC WITH DIFFERENTIAL/PLATELET - Abnormal; Notable for the following components:   WBC 15.6 (*)    RBC 3.10 (*)    Hemoglobin 9.2 (*)    HCT 27.7 (*)    RDW 16.7 (*)    Neutro Abs 13.1 (*)    Monocytes Absolute 1.7 (*)    Abs Immature Granulocytes 0.08 (*)    All other components within normal limits  PROTIME-INR - Abnormal; Notable for the following components:   Prothrombin Time 18.0 (*)    INR 1.6 (*)    All other components within normal limits  URINALYSIS, ROUTINE W REFLEX MICROSCOPIC - Abnormal; Notable for the following components:   Glucose, UA >=500 (*)    Ketones, ur 20 (*)    Bacteria, UA RARE (*)    All other components within normal limits    Otherwise labs showing:    Recent Labs  Lab 09/14/2020 1815  NA 130*  K 4.8  CO2 23  GLUCOSE 416*  BUN 37*  CREATININE 1.43*  CALCIUM 8.7*    Cr     Up from baseline see below Lab Results  Component Value Date   CREATININE 1.43 (H) 09/16/2020   CREATININE 1.20 08/30/2020   CREATININE 1.14 08/11/2020    Recent Labs  Lab 09/05/2020 1815  AST 50*  ALT 50*  ALKPHOS 232*  BILITOT 3.2*  PROT 7.3  ALBUMIN 3.1*   Lab Results  Component Value Date   CALCIUM 8.7 (L) 09/29/2020     WBC      Component Value Date/Time   WBC 15.6 (H) 09/16/2020 1815   LYMPHSABS 0.8 09/15/2020 1815   MONOABS 1.7 (H) 08/30/2020 1815   EOSABS 0.0 09/29/2020 1815   BASOSABS 0.0 08/30/2020 1815    Plt: Lab Results  Component Value Date   PLT 163 09/03/2020    Lactic Acid, Venous    Component Value Date/Time   LATICACIDVEN 1.8 09/14/2020 1815     Procalcitonin   Ordered   COVID-19 Labs  No results for input(s): DDIMER, FERRITIN, LDH, CRP in the last 72 hours.  Lab Results  Component Value Date   SARSCOV2NAA NEGATIVE 09/14/2020   [place] NEGATIVE 08/11/2020   SARSCOV2NAA Not Detected 12/24/2019   [place] NEGATIVE 10/27/2019  HG/HCT   stable,       Component Value Date/Time   HGB 9.2 (L) 09/24/2020 1815   HGB 8.8 (L) 08/30/2020 1115   HGB 14.3 05/14/2017 0809   HCT 27.7 (L) 09/19/2020 1815   HCT 41.4 05/14/2017 0809   MCV 89.4 09/07/2020 1815   MCV 95 05/14/2017 0809    No results for input(s): LIPASE, AMYLASE in the last 168 hours. No results for input(s): AMMONIA in the last 168 hours.      ECG: not Ordered    BNP (last 3 results) No results for input(s): BNP in the last 8760 hours.    UA   no evidence of UTI    Urine analysis:    Component Value Date/Time   COLORURINE YELLOW 09/15/2020 1850   APPEARANCEUR CLEAR 09/16/2020 1850   LABSPEC 1.024 09/05/2020 1850   PHURINE 5.0 09/16/2020 1850   GLUCOSEU >=500 (A) 09/04/2020 1850   HGBUR NEGATIVE 09/17/2020 1850   BILIRUBINUR NEGATIVE 09/14/2020 1850   KETONESUR 20 (A) 09/02/2020 1850   PROTEINUR NEGATIVE 09/04/2020 1850   NITRITE NEGATIVE 09/02/2020 1850   LEUKOCYTESUR NEGATIVE 09/20/2020 1850    Ordered     CXR - right basilar scarring and/or atelectasis  CTabd/pelvis -  Sequelae of cholangiocarcinoma   new right middle lobe and lingula opacity suspicious for developing bilateral respiratory infection   ED Triage Vitals  Enc Vitals Group     BP 09/10/2020 1744 (!) 128/49     Pulse Rate 09/15/2020 1744 88     Resp 08/31/2020 1744 (!) 23     Temp 09/15/2020 1745 (!) 102.1 F (38.9 C)     Temp Source 09/03/2020 1745 Oral     SpO2 09/16/2020 1744 92 %     Weight 09/17/2020 1745 136 lb 11 oz (62 kg)     Height 09/15/2020 1745 5' 8"  (1.727 m)     Head Circumference --      Peak Flow --      Pain Score 09/15/2020 1745 0     Pain Loc --       Pain Edu? --      Excl. in [place]? --   TMAX(24)@       Latest  Blood pressure (!) 118/58, pulse 84, temperature 98.6 F (37 C), temperature source Oral, resp. rate 14, height 5' 8"  (1.727 m), weight 62 kg, SpO2 97 %.     Review of Systems:    Pertinent positives include:   Fevers, chills, fatigue,  Constitutional:  No weight loss, night sweats, weight loss  HEENT:  No headaches, Difficulty swallowing,Tooth/dental problems,Sore throat,  No sneezing, itching, ear ache, nasal congestion, post nasal drip,  Cardio-vascular:  No chest pain, Orthopnea, PND, anasarca, dizziness, palpitations.no Bilateral lower extremity swelling  GI:  No heartburn, indigestion, abdominal pain, nausea, vomiting, diarrhea, change in bowel habits, loss of appetite, melena, blood in stool, hematemesis Resp:  no shortness of breath at rest. No dyspnea on exertion, No excess mucus, no productive cough, No non-productive cough, No coughing up of blood.No change in color of mucus.No wheezing. Skin:  no rash or lesions. No jaundice GU:  no dysuria, change in color of urine, no urgency or frequency. No straining to urinate.  No flank pain.  Musculoskeletal:  No joint pain or no joint swelling. No decreased range of motion. No back pain.  Psych:  No change in mood or affect. No depression or anxiety. No memory loss.  Neuro: no localizing neurological complaints, no  tingling, no weakness, no double vision, no gait abnormality, no slurred speech, no confusion  All systems reviewed and apart from [place] all are negative  Past Medical History:   Past Medical History:  Diagnosis Date  . Diabetes mellitus type I, controlled ([place])   . Dyslipidemia   . ED (erectile dysfunction)   . Essential hypertension   . Gilbert's disease   . Hepatitis B carrier [facility])        Past Surgical History:  Procedure Laterality Date  . APPENDECTOMY  1964  . BILIARY BRUSHING  01/21/2019   Procedure: BILIARY BRUSHING;  Surgeon: Carol Ada, MD;  Location: [facility];  Service: Endoscopy;;  . BILIARY STENT PLACEMENT N/A 01/21/2019   Procedure: BILIARY STENT PLACEMENT;  Surgeon: Carol Ada, MD;  Location: [facility];  Service: Endoscopy;  Laterality: N/A;  . ENDOSCOPIC RETROGRADE CHOLANGIOPANCREATOGRAPHY (ERCP) WITH PROPOFOL N/A 01/21/2019   Procedure: ENDOSCOPIC RETROGRADE CHOLANGIOPANCREATOGRAPHY (ERCP) WITH PROPOFOL;  Surgeon: Carol Ada, MD;  Location: [facility];  Service: Endoscopy;  Laterality: N/A;  . ESOPHAGOGASTRODUODENOSCOPY (EGD) WITH PROPOFOL N/A 01/21/2019   Procedure: ESOPHAGOGASTRODUODENOSCOPY (EGD) WITH PROPOFOL;  Surgeon: Carol Ada, MD;  Location: [facility];  Service: Endoscopy;  Laterality: N/A;  . FEMORAL HERNIA REPAIR    . IR CHOLANGIOGRAM EXISTING TUBE  05/13/2019  . IR CHOLANGIOGRAM EXISTING TUBE  06/08/2019  . IR EXCHANGE BILIARY DRAIN  05/18/2019  . IR EXCHANGE BILIARY DRAIN  06/08/2019  . IR IMAGING GUIDED PORT INSERTION  03/08/2019  . IR PERC CHOLECYSTOSTOMY  05/04/2019  . IR THORACENTESIS ASP PLEURAL SPACE W/IMG GUIDE  10/29/2019  . LEFT HEART CATH AND CORONARY ANGIOGRAPHY N/A 05/19/2017   Procedure: LEFT HEART CATH AND CORONARY ANGIOGRAPHY;  Surgeon: Leonie Man, MD;  Location: [facility];  Service: Cardiovascular;  Laterality: N/A;  . SPHINCTEROTOMY  01/21/2019   Procedure: SPHINCTEROTOMY;  Surgeon: Carol Ada, MD;  Location: [facility];  Service: Endoscopy;;  . UPPER ESOPHAGEAL ENDOSCOPIC ULTRASOUND (EUS) N/A 01/21/2019   Procedure: UPPER ESOPHAGEAL ENDOSCOPIC ULTRASOUND (EUS);  Surgeon: Carol Ada, MD;  Location: [facility];  Service: Endoscopy;  Laterality: N/A;    Social History:  Ambulatory   cane,       reports that he has never smoked. He has never used smokeless tobacco. He reports current alcohol use of about 7.0 standard drinks of alcohol per week. He reports that he does not use drugs.     Family History:   Family History  Problem Relation Age of  Onset  . Diabetes Father     Allergies: Allergies  Allergen Reactions  . Erythromycin     Severe Abdominal pain     Prior to Admission medications   Medication Sig Start Date End Date Taking? Authorizing Provider  amLODipine (NORVASC) 5 MG tablet Take 5 mg by mouth daily.    Yes [provider]  atenolol (TENORMIN) 25 MG tablet Take 25 mg by mouth daily. Once a day    Yes [provider]  insulin NPH Human (NOVOLIN N) 100 UNIT/ML injection Inject 20 Units into the skin 2 (two) times daily before a meal.    Yes [provider]  insulin regular (NOVOLIN R) 100 units/mL injection Inject 20 Units into the skin 2 (two) times daily before a meal.    Yes [provider]  mirtazapine (REMERON) 15 MG tablet Take 1 tablet (15 mg total) by mouth at bedtime. 08/22/20  Yes Alla Feeling, NP  morphine (MS CONTIN) 30 MG 12  hr tablet Take 1 tablet (30 mg total) by mouth every 12 (twelve) hours. 08/30/20  Yes [name], MD  oxycodone (OXY-IR) 5 MG capsule Take 1-2 capsules (5-10 mg total) by mouth every 6 (six) hours as needed for pain (severe pain). Patient taking differently: Take 5-10 mg by mouth every 6 (six) hours as needed for pain.  07/26/20  Yes [name], MD  Blood Glucose Monitoring Suppl (ACCU-CHEK NANO SMARTVIEW) W/DEVICE KIT 1 kit by Does not apply route 2 (two) times daily. Patient taking differently: 1 kit by Does not apply route See admin instructions. Test blood sugars 12x's daily 10/23/12   Denita Lung, MD  glucose blood test strip Test 3 times a day. Patient taking differently: 1 each by Other route See admin instructions. Test 12 times a day. 10/26/12   Denita Lung, MD  ondansetron (ZOFRAN) 8 MG tablet TAKE 1 TABLET BY MOUTH 2 TIMES DAY AS NEEDED. START ON 3RD DAY AFTER CHEMOTHERAPY Patient not taking: Reported on 08/30/2020 08/19/19   [name], MD  linaclotide [facility]) 145 MCG CAPS capsule Take 1 capsule (145 mcg total) by mouth daily before  breakfast. Patient not taking: Reported on 05/09/2020 04/28/20 08/11/20  [name], MD  pantoprazole (PROTONIX) 40 MG tablet TAKE 1 TABLET BY MOUTH EVERY DAY Patient not taking: Reported on 08/11/2020 05/16/20 08/11/20  [name], MD  prochlorperazine (COMPAZINE) 10 MG tablet TAKE 1 TABLET (10 MG TOTAL) BY MOUTH EVERY 6 (SIX) HOURS AS NEEDED (NAUSEA OR VOMITING). Patient not taking: Reported on 08/11/2020 08/19/19 08/11/20  [name], MD   Physical Exam: Vitals with BMI 09/18/2020 09/16/2020 09/26/2020  Height - - -  Weight - - -  BMI - - -  Systolic 229 798 921  Diastolic 58 63 65  Pulse 84 89 88     1. General:  in No  Acute distress   Chronically ill -appearing 2. Psychological: Alert and  Oriented 3. Head/ENT:   Dry Mucous Membranes                          Head Non traumatic, neck supple                             Poor Dentition 4. SKIN:   decreased Skin turgor,  Skin clean Dry and intact no rash 5. Heart: Regular rate and rhythm no  Murmur, no Rub or gallop 6. Lungs:  no wheezes or crackles   7. Abdomen: Soft,  non-tender,  distended  Obese bowel sounds present 8. Lower extremities: no clubbing, cyanosis, no  edema 9. Neurologically Grossly intact, moving all 4 extremities equally  10. MSK: Normal range of motion   All other LABS:     Recent Labs  Lab 09/29/2020 1815  WBC 15.6*  NEUTROABS 13.1*  HGB 9.2*  HCT 27.7*  MCV 89.4  PLT 163     Recent Labs  Lab 09/28/2020 1815  NA 130*  K 4.8  CL 96*  CO2 23  GLUCOSE 416*  BUN 37*  CREATININE 1.43*  CALCIUM 8.7*     Recent Labs  Lab 09/07/2020 1815  AST 50*  ALT 50*  ALKPHOS 232*  BILITOT 3.2*  PROT 7.3  ALBUMIN 3.1*       Cultures:    Component Value Date/Time   SDES  12/23/2019 1001    URINE, CLEAN CATCH Performed at [facility], [place] [address], [place], [place] [address]    SPECREQUEST  12/23/2019 1001    NONE Performed at [facility], [place]  [address] [place], [place] [address]    CULT  12/23/2019 1001    NO GROWTH Performed at [facility], [place] [address], [place], [place] [address]    REPTSTATUS 12/24/2019 FINAL 12/23/2019 1001     Radiological Exams on Admission: CT ABDOMEN PELVIS W CONTRAST  Result Date: 09/18/2020 CLINICAL DATA:  74 year old male with cholangiocarcinoma. Altered mental status. EXAM: CT ABDOMEN AND PELVIS WITH CONTRAST TECHNIQUE: Multidetector CT imaging of the abdomen and pelvis was performed using the standard protocol following bolus administration of intravenous contrast. CONTRAST:  126m OMNIPAQUE IOHEXOL 300 MG/ML  SOLN COMPARISON:  Restaging CT Chest, Abdomen, and Pelvis 04/12/2020, and earlier. FINDINGS: Lower chest: Chronic layering right pleural effusion with associated mild pleural thickening and enhancement is unchanged. Underlying simple fluid density. Adjacent right lower lung atelectasis is stable. However, there is also patchy new peribronchial opacity in the right middle lobe (series 4, image 26) and several small indistinct nodular opacities in the visible lingula (series 4, image 4), although there is superimposed respiratory motion. Hepatobiliary: Metal CBD stent remains in place. Pneumobilia has not significantly changed. But intrahepatic biliary ductal dilatation has progressed, especially since prior 02/03/2020 CT. No discrete liver lesion. Similar mild gallbladder distension. Pancreas: Increasing indistinct soft tissue medial to the distal CBD stent at the level of the pancreas on series 2, image 33. Associated increasing pancreatic ductal dilatation (series 2, image 34) with severe superimposed atrophy of pancreatic parenchyma which is partially calcified. Spleen: Stable mild splenomegaly. Adrenals/Urinary Tract: Negative adrenal glands. Chronic asymmetric left renal volume loss. Bilateral renal enhancement and contrast excretion appears stable and within normal limits. Chronic benign right  renal cysts. Unremarkable urinary bladder. Stomach/Bowel: No dilated large or small bowel. Retained stool throughout much of the colon. Trace fluid layering in both gutters. No discrete bowel inflammation identified. Mild to moderate gas and fluid distension of the stomach similar to the July CT. Abrupt tapering of the duodenum distal to the bulb appear stable. No free air. Vascular/Lymphatic: Calcified aortic atherosclerosis. Major arterial structures in the abdomen and pelvis are patent. The IVC appears to be patent. 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 SMV remains patent. No discrete or progressed abdominal or pelvic lymphadenopathy. Reproductive: Negative. Other: Small volume pelvic free fluid with simple fluid density is new since July (series 2, image 74). Chronic soft tissue thickening along the ventral abdominal wall likely from subcutaneous injections. Musculoskeletal: No acute or suspicious osseous lesion identified. IMPRESSION: 1. Sequelae of cholangiocarcinoma with evidence of progression about the metal CBD stent since the previous contrast enhanced CT Abdomen 02/23/2020. Associated dilatation of obstructed main pancreatic and intrahepatic bile ducts also appears progressed since May and July. 2. Chronic right pleural effusion and fibrothorax with patchy new right middle lobe and lingula opacity suspicious for developing bilateral respiratory infection. New pulmonary metastatic disease felt less likely. 3. New small volume ascites in the abdomen and pelvis. Electronically Signed   By: HGenevie AnnM.D.   On: 08/31/2020 21:18   DG Chest Port 1 View  Result Date: 09/27/2020 CLINICAL DATA:  Altered mental status. EXAM: PORTABLE CHEST 1 VIEW COMPARISON:  October 29, 2019 FINDINGS: There is stable right-sided venous Port-A-Cath positioning. A trace amount of scarring and/or atelectasis is noted within the right lung base. There is a very small right pleural  effusion. This is  decreased in size when compared to the prior exam. No pneumothorax is identified. The heart size and mediastinal contours are within normal limits. Radiopaque surgical clips and a radiopaque tips stent is seen overlying the right upper quadrant. The visualized skeletal structures are unremarkable. IMPRESSION: 1. Trace amount of right basilar scarring and/or atelectasis. 2. Very small right pleural effusion, decreased in size when compared to the prior study. Electronically Signed   By: Virgina Norfolk M.D.   On: 09/14/2020 19:14    Chart has been reviewed    Assessment/Plan  74 y.o. male with medical history significant of metastatic cholangiocarcinoma inoperable, diabetes, hypertension chronic pancreatitis CAD, DM 2 currently on insulin admitted for sepsis due to Pneumonia   Present on Admission: Sepsis -   -SIRS criteria met with  elevated white blood cell count,       Component Value Date/Time   WBC 15.6 (H) 09/22/2020 1815   LYMPHSABS 0.8 09/12/2020 1815     fever    RR >20 Today's Vitals   09/16/2020 1845 09/24/2020 2000 09/28/2020 2130 09/23/2020 2157  BP: 133/65 126/63 (!) 118/58   Pulse: 88 89 84   Resp: 19 15 14    Temp:    98.6 F (37 C)  TempSrc:    Oral  SpO2: 98% 99% 97%   Weight:      Height:      PainSc:        -Most likely source being:  pulmonary,        - Obtain serial lactic acid and procalcitonin level.  - Initiated IV antibiotics   - await results of blood and urine culture  - Rehydrate aggressively         11:10 PM  . CAP (community acquired pneumonia) -  - will admit for treatment of CAP will start on appropriate antibiotic coverage.  Rocephin and doxycycline   Obtain:  sputum cultures,                  blood cultures if febrile or if decompensates.                   strep pneumo UA antigen,                    check for Legionella.                Provide oxygen as needed.   [place] Kitchen Hyponatremia -acute on chronic we will obtain urine electrolytes check  serum osmolarity continue to follow  . Type 2 diabetes hyperglycemia -in the setting of diabetes  Change insulin to Lantus for now to improve control while hospitalized Order sliding scale Diabetes care coordinator consult  . Essential hypertension given sepsis we will hold off on blood pressure meds for now  . Cholangiocarcinoma [facility]) -email primary oncologist to let them know patient has been hospitalized patient no longer receiving chemotherapy  . Anemia   Chronic continue to monitor  . AKI (acute kidney injury) ([place]) -rehydrate and follow fluid status as well as kidney function obtain urine electrolytes  CAD stable resume beta-blocker when blood pressure allows  Elevated liver function chronic likely secondary cholangiocarcinoma   Other plan as per orders.  DVT prophylaxis:  SCD      Code Status:    Code Status: Prior   DNR/DNI as per patient   I had personally discussed CODE STATUS with patient     Family Communication:   Family  at  Bedside  plan of care was discussed with  Husband   Disposition Plan:   To home once workup is complete and patient is stable   Following barriers for discharge:                            Electrolytes corrected                                                                                          Afebrile, white count improving able to transition to PO antibiotics                                      Palliative care    consulted         Consults called:   email oncology to let them know pt was admitted  Admission status:  ED Disposition    ED Disposition Condition [place]: [place] [[id number]]  Level of Care: Telemetry [5]  Admit to tele based on following criteria: Other see comments  Comments: sirs  Covid Evaluation: Confirmed COVID Negative  Diagnosis: CAP (community acquired pneumonia) [[id number]]  Admitting Physician: Toy Baker [[id number]]  Attending Physician: Toy Baker [[id number]]        Obs     Need for IV antibiotics, IV fluids,    Level of care    tele  For 12H    Lab Results  Component Value Date   [place] 09/07/2020     Precautions: admitted as   Covid Negative    PPE: Used by the provider:   P100  eye Goggles,  Gloves      Stevey Stapleton 08/30/2020, 11:03 PM    [facility]     after 2 AM please page floor coverage PA If 7AM-7PM, please contact the day team taking care of the patient using [email or web address]   Patient was evaluated in the context of the global COVID-19 pandemic, which necessitated consideration that the patient might be at risk for infection with the SARS-CoV-2 virus that causes COVID-19. Institutional protocols and algorithms that pertain to the evaluation of patients at risk for COVID-19 are in a state of rapid change based on information released by regulatory bodies including the CDC and federal and state organizations. These policies and algorithms were followed during the patient's care.

## 2020-09-06 NOTE — ED Provider Notes (Signed)
[facility] DEPT Provider Note   CSN: [id number] Arrival date & time: 08/31/2020  1715     History Chief Complaint  Patient presents with  . Altered Mental Status    Christopher Welch is a 74 y.o. male.  Patient is a 74 year old male with past medical history of diabetes, hypertension, hyperlipidemia, and advanced cholangiocarcinoma.  Patient has been through chemotherapy with Dr. Burr Medico.  From review of the chart, it appears as though he was briefly enrolled in hospice, however this did not seem to work out.  He is DNR and requesting comfort measures.  Patient woke this morning with severe weakness.  He denies any specific symptoms, but was found to be febrile here with a temp of 102.1.  He denies any specific complaints that would explain his fever.  He denies any cough, shortness of breath, abdominal pain, diarrhea, or difficulty urinating.  The history is provided by the patient.       Past Medical History:  Diagnosis Date  . Diabetes mellitus type I, controlled ([place])   . Dyslipidemia   . ED (erectile dysfunction)   . Essential hypertension   . Gilbert's disease   . Hepatitis B carrier [facility])     Patient Active Problem List   Diagnosis Date Noted  . Encounter for antineoplastic chemotherapy 01/20/2020  . Fever 12/23/2019  . Non-intractable vomiting with nausea 06/20/2019  . Abdominal pain 06/17/2019  . AKI (acute kidney injury) ([place]) 06/17/2019  . Hyperglycemia 06/17/2019  . Hyponatremia 06/17/2019  . Hyperkalemia 06/17/2019  . Acute cholecystitis 05/03/2019  . Thrombocytopenia ([place]) 05/03/2019  . Anemia 05/03/2019  . Port-A-Cath in place 03/19/2019  . Cholangiocarcinoma ([place]) 03/08/2019  . Goals of care, counseling/discussion 03/08/2019  . Abnormal cardiac CT angiography 05/19/2017  . Atypical angina ([place]) 04/23/2017  . Chest pain with moderate risk for cardiac etiology 04/17/2017  . Essential hypertension   . Dyslipidemia   . Insomnia  07/28/2015  . Hypoglycemia 04/14/2015  . Diabetic retinopathy, nonproliferative, mild ([place]) 12/05/2011  . DM2 (diabetes mellitus, type 2) ([place]) 01/12/2008  . Hyperlipidemia with target low density lipoprotein (LDL) cholesterol less than 70 mg/dL 01/12/2008  . HYPERTENSION 01/12/2008    Past Surgical History:  Procedure Laterality Date  . APPENDECTOMY  1964  . BILIARY BRUSHING  01/21/2019   Procedure: BILIARY BRUSHING;  Surgeon: Carol Ada, MD;  Location: [facility];  Service: Endoscopy;;  . BILIARY STENT PLACEMENT N/A 01/21/2019   Procedure: BILIARY STENT PLACEMENT;  Surgeon: Carol Ada, MD;  Location: [facility];  Service: Endoscopy;  Laterality: N/A;  . ENDOSCOPIC RETROGRADE CHOLANGIOPANCREATOGRAPHY (ERCP) WITH PROPOFOL N/A 01/21/2019   Procedure: ENDOSCOPIC RETROGRADE CHOLANGIOPANCREATOGRAPHY (ERCP) WITH PROPOFOL;  Surgeon: Carol Ada, MD;  Location: [facility];  Service: Endoscopy;  Laterality: N/A;  . ESOPHAGOGASTRODUODENOSCOPY (EGD) WITH PROPOFOL N/A 01/21/2019   Procedure: ESOPHAGOGASTRODUODENOSCOPY (EGD) WITH PROPOFOL;  Surgeon: Carol Ada, MD;  Location: [facility];  Service: Endoscopy;  Laterality: N/A;  . FEMORAL HERNIA REPAIR    . IR CHOLANGIOGRAM EXISTING TUBE  05/13/2019  . IR CHOLANGIOGRAM EXISTING TUBE  06/08/2019  . IR EXCHANGE BILIARY DRAIN  05/18/2019  . IR EXCHANGE BILIARY DRAIN  06/08/2019  . IR IMAGING GUIDED PORT INSERTION  03/08/2019  . IR PERC CHOLECYSTOSTOMY  05/04/2019  . IR THORACENTESIS ASP PLEURAL SPACE W/IMG GUIDE  10/29/2019  . LEFT HEART CATH AND CORONARY ANGIOGRAPHY N/A 05/19/2017   Procedure: LEFT HEART CATH AND CORONARY ANGIOGRAPHY;  Surgeon: Leonie Man, MD;  Location: [facility];  Service: Cardiovascular;  Laterality: N/A;  . SPHINCTEROTOMY  01/21/2019   Procedure: SPHINCTEROTOMY;  Surgeon: Carol Ada, MD;  Location: [facility];  Service: Endoscopy;;  . UPPER ESOPHAGEAL ENDOSCOPIC ULTRASOUND (EUS) N/A 01/21/2019   Procedure:  UPPER ESOPHAGEAL ENDOSCOPIC ULTRASOUND (EUS);  Surgeon: Carol Ada, MD;  Location: [facility];  Service: Endoscopy;  Laterality: N/A;       Family History  Problem Relation Age of Onset  . Diabetes Father     Social History   Tobacco Use  . Smoking status: Never Smoker  . Smokeless tobacco: Never Used  Vaping Use  . Vaping Use: Never used  Substance Use Topics  . Alcohol use: Yes    Alcohol/week: 7.0 standard drinks    Types: 7 Glasses of wine per week  . Drug use: No    Home Medications Prior to Admission medications   Medication Sig Start Date End Date Taking? Authorizing Provider  amLODipine (NORVASC) 5 MG tablet Take 5 mg by mouth daily.     [provider]  atenolol (TENORMIN) 25 MG tablet Take 25 mg by mouth daily. Once a day     [provider]  Blood Glucose Monitoring Suppl (ACCU-CHEK NANO SMARTVIEW) W/DEVICE KIT 1 kit by Does not apply route 2 (two) times daily. Patient taking differently: 1 kit by Does not apply route See admin instructions. Test blood sugars 12x's daily 10/23/12   Denita Lung, MD  glucose blood test strip Test 3 times a day. Patient taking differently: 1 each by Other route See admin instructions. Test 12 times a day. 10/26/12   Denita Lung, MD  insulin NPH Human (NOVOLIN N) 100 UNIT/ML injection Inject 20 Units into the skin 2 (two) times daily before a meal.     [provider]  insulin regular (NOVOLIN R) 100 units/mL injection Inject 20 Units into the skin 2 (two) times daily before a meal.     [provider]  mirtazapine (REMERON) 15 MG tablet Take 1 tablet (15 mg total) by mouth at bedtime. 08/22/20   Alla Feeling, NP  morphine (MS CONTIN) 30 MG 12 hr tablet Take 1 tablet (30 mg total) by mouth every 12 (twelve) hours. 08/30/20   [name], MD  ondansetron (ZOFRAN) 8 MG tablet TAKE 1 TABLET BY MOUTH 2 TIMES DAY AS NEEDED. START ON 3RD DAY AFTER CHEMOTHERAPY Patient not taking: Reported on  08/30/2020 08/19/19   [name], MD  oxycodone (OXY-IR) 5 MG capsule Take 1-2 capsules (5-10 mg total) by mouth every 6 (six) hours as needed for pain (severe pain). 07/26/20   [name], MD  linaclotide [facility]) 145 MCG CAPS capsule Take 1 capsule (145 mcg total) by mouth daily before breakfast. Patient not taking: Reported on 05/09/2020 04/28/20 08/11/20  [name], MD  pantoprazole (PROTONIX) 40 MG tablet TAKE 1 TABLET BY MOUTH EVERY DAY Patient not taking: Reported on 08/11/2020 05/16/20 08/11/20  [name], MD  prochlorperazine (COMPAZINE) 10 MG tablet TAKE 1 TABLET (10 MG TOTAL) BY MOUTH EVERY 6 (SIX) HOURS AS NEEDED (NAUSEA OR VOMITING). Patient not taking: Reported on 08/11/2020 08/19/19 08/11/20  [name], MD  sertraline (ZOLOFT) 50 MG tablet Take 1 tablet (50 mg total) by mouth daily. Patient not taking: Reported on 08/11/2020 08/07/20 08/11/20  [name], MD    Allergies    Erythromycin  Review of Systems   Review of Systems  All other systems reviewed and are negative.   Physical Exam  Updated Vital Signs BP (!) 128/49 (BP Location: Left Arm)   Pulse 89   Temp (!) 102.1 F (38.9 C) (Oral) Comment: Simultaneous filing. User may not have seen previous data. Comment (Src): Simultaneous filing. User may not have seen previous data.  Resp (!) 25   Ht 5' 8" (1.727 m)   Wt 62 kg   SpO2 99%   BMI 20.78 kg/m   Physical Exam Vitals and nursing note reviewed.  Constitutional:      General: He is not in acute distress.    Appearance: He is well-developed. He is not diaphoretic.     Comments: Patient is a chronically ill-appearing male in no acute distress.  He is awake and alert, but does appear intermittently confused unable to recall details.  He is somewhat slow to respond.  HENT:     Head: Normocephalic and atraumatic.  Eyes:     Extraocular Movements: Extraocular movements intact.     Pupils: Pupils are equal, round, and reactive to light.  Cardiovascular:     Rate and  Rhythm: Normal rate and regular rhythm.     Heart sounds: No murmur heard.  No friction rub.  Pulmonary:     Effort: Pulmonary effort is normal. No respiratory distress.     Breath sounds: Normal breath sounds. No wheezing or rales.  Abdominal:     General: Bowel sounds are normal. There is no distension.     Palpations: Abdomen is soft.     Tenderness: There is no abdominal tenderness.  Musculoskeletal:        General: Normal range of motion.     Cervical back: Normal range of motion and neck supple.  Skin:    General: Skin is warm and dry.  Neurological:     General: No focal deficit present.     Mental Status: He is alert and oriented to person, place, and time.     Cranial Nerves: No cranial nerve deficit.     Motor: Weakness present.     Coordination: Coordination normal.     Comments: There is generalized weakness, however no focal deficit on exam.     ED Results / Procedures / Treatments   Labs (all labs ordered are listed, but only abnormal results are displayed) Labs Reviewed  CULTURE, BLOOD (ROUTINE X 2)  CULTURE, BLOOD (ROUTINE X 2)  RESP PANEL BY RT-PCR (FLU A&B, COVID) ARPGX2  COMPREHENSIVE METABOLIC PANEL  LACTIC ACID, PLASMA  LACTIC ACID, PLASMA  CBC WITH DIFFERENTIAL/PLATELET  PROTIME-INR  URINALYSIS, ROUTINE W REFLEX MICROSCOPIC    EKG None  Radiology No results found.  Procedures Procedures (including critical care time)  Medications Ordered in ED Medications - No data to display  ED Course  I have reviewed the triage vital signs and the nursing notes.  Pertinent labs & imaging results that were available during my care of the patient were reviewed by me and considered in my medical decision making (see chart for details).    MDM Rules/Calculators/A&P  Patient with history of cholangiocarcinoma status post chemotherapy presenting with complaints of weakness and confusion.  Patient found to be febrile here upon presentation to 102.  Febrile  work-up initiated including blood cultures, urinalysis, Covid swab, chest x-ray, and CT scan of the abdomen and pelvis.  Thus far all are unremarkable with the exception of what appears to be developing pneumonia in the lung bases seen on his CT scan.  He has a white count of 16,000.  Patient given vancomycin  and Rocephin for HCAP and will be admitted to the hospitalist service.  I spoken with Dr. Roel Cluck who agrees to admit.  CRITICAL CARE Performed by: Veryl Speak Total critical care time: 45 minutes Critical care time was exclusive of separately billable procedures and treating other patients. Critical care was necessary to treat or prevent imminent or life-threatening deterioration. Critical care was time spent personally by me on the following activities: development of treatment plan with patient and/or surrogate as well as nursing, discussions with consultants, evaluation of patient's response to treatment, examination of patient, obtaining history from patient or surrogate, ordering and performing treatments and interventions, ordering and review of laboratory studies, ordering and review of radiographic studies, pulse oximetry and re-evaluation of patient's condition.   Final Clinical Impression(s) / ED Diagnoses Final diagnoses:  None    Rx / DC Orders ED Discharge Orders    None       Veryl Speak, MD 09/18/2020 2213

## 2020-09-07 ENCOUNTER — Encounter (HOSPITAL_COMMUNITY): Payer: Self-pay | Admitting: Internal Medicine

## 2020-09-07 DIAGNOSIS — A419 Sepsis, unspecified organism: Secondary | ICD-10-CM | POA: Diagnosis present

## 2020-09-07 DIAGNOSIS — Z888 Allergy status to other drugs, medicaments and biological substances status: Secondary | ICD-10-CM | POA: Diagnosis not present

## 2020-09-07 DIAGNOSIS — D649 Anemia, unspecified: Secondary | ICD-10-CM | POA: Diagnosis not present

## 2020-09-07 DIAGNOSIS — Z66 Do not resuscitate: Secondary | ICD-10-CM | POA: Diagnosis present

## 2020-09-07 DIAGNOSIS — K861 Other chronic pancreatitis: Secondary | ICD-10-CM | POA: Diagnosis present

## 2020-09-07 DIAGNOSIS — I129 Hypertensive chronic kidney disease with stage 1 through stage 4 chronic kidney disease, or unspecified chronic kidney disease: Secondary | ICD-10-CM | POA: Diagnosis present

## 2020-09-07 DIAGNOSIS — C240 Malignant neoplasm of extrahepatic bile duct: Secondary | ICD-10-CM | POA: Diagnosis present

## 2020-09-07 DIAGNOSIS — R0603 Acute respiratory distress: Secondary | ICD-10-CM | POA: Diagnosis present

## 2020-09-07 DIAGNOSIS — E871 Hypo-osmolality and hyponatremia: Secondary | ICD-10-CM | POA: Diagnosis present

## 2020-09-07 DIAGNOSIS — J90 Pleural effusion, not elsewhere classified: Secondary | ICD-10-CM | POA: Diagnosis present

## 2020-09-07 DIAGNOSIS — R112 Nausea with vomiting, unspecified: Secondary | ICD-10-CM | POA: Diagnosis not present

## 2020-09-07 DIAGNOSIS — Z8509 Personal history of malignant neoplasm of other digestive organs: Secondary | ICD-10-CM | POA: Diagnosis not present

## 2020-09-07 DIAGNOSIS — E785 Hyperlipidemia, unspecified: Secondary | ICD-10-CM | POA: Diagnosis present

## 2020-09-07 DIAGNOSIS — Z20822 Contact with and (suspected) exposure to covid-19: Secondary | ICD-10-CM | POA: Diagnosis present

## 2020-09-07 DIAGNOSIS — Z7189 Other specified counseling: Secondary | ICD-10-CM | POA: Diagnosis not present

## 2020-09-07 DIAGNOSIS — J189 Pneumonia, unspecified organism: Secondary | ICD-10-CM

## 2020-09-07 DIAGNOSIS — Z515 Encounter for palliative care: Secondary | ICD-10-CM | POA: Diagnosis not present

## 2020-09-07 DIAGNOSIS — C221 Intrahepatic bile duct carcinoma: Secondary | ICD-10-CM

## 2020-09-07 DIAGNOSIS — D638 Anemia in other chronic diseases classified elsewhere: Secondary | ICD-10-CM | POA: Diagnosis present

## 2020-09-07 DIAGNOSIS — A4159 Other Gram-negative sepsis: Secondary | ICD-10-CM | POA: Diagnosis present

## 2020-09-07 DIAGNOSIS — R531 Weakness: Secondary | ICD-10-CM | POA: Diagnosis not present

## 2020-09-07 DIAGNOSIS — N179 Acute kidney failure, unspecified: Secondary | ICD-10-CM | POA: Diagnosis present

## 2020-09-07 DIAGNOSIS — Z923 Personal history of irradiation: Secondary | ICD-10-CM | POA: Diagnosis not present

## 2020-09-07 DIAGNOSIS — I10 Essential (primary) hypertension: Secondary | ICD-10-CM | POA: Diagnosis not present

## 2020-09-07 DIAGNOSIS — D696 Thrombocytopenia, unspecified: Secondary | ICD-10-CM | POA: Diagnosis present

## 2020-09-07 DIAGNOSIS — R7881 Bacteremia: Secondary | ICD-10-CM | POA: Diagnosis not present

## 2020-09-07 DIAGNOSIS — B181 Chronic viral hepatitis B without delta-agent: Secondary | ICD-10-CM | POA: Diagnosis present

## 2020-09-07 DIAGNOSIS — K921 Melena: Secondary | ICD-10-CM | POA: Diagnosis present

## 2020-09-07 DIAGNOSIS — E1140 Type 2 diabetes mellitus with diabetic neuropathy, unspecified: Secondary | ICD-10-CM

## 2020-09-07 DIAGNOSIS — I2510 Atherosclerotic heart disease of native coronary artery without angina pectoris: Secondary | ICD-10-CM | POA: Diagnosis present

## 2020-09-07 DIAGNOSIS — Z833 Family history of diabetes mellitus: Secondary | ICD-10-CM | POA: Diagnosis not present

## 2020-09-07 DIAGNOSIS — C782 Secondary malignant neoplasm of pleura: Secondary | ICD-10-CM | POA: Diagnosis present

## 2020-09-07 DIAGNOSIS — G893 Neoplasm related pain (acute) (chronic): Secondary | ICD-10-CM | POA: Diagnosis present

## 2020-09-07 DIAGNOSIS — Z794 Long term (current) use of insulin: Secondary | ICD-10-CM | POA: Diagnosis not present

## 2020-09-07 DIAGNOSIS — K625 Hemorrhage of anus and rectum: Secondary | ICD-10-CM | POA: Diagnosis not present

## 2020-09-07 DIAGNOSIS — B961 Klebsiella pneumoniae [K. pneumoniae] as the cause of diseases classified elsewhere: Secondary | ICD-10-CM | POA: Diagnosis not present

## 2020-09-07 DIAGNOSIS — R739 Hyperglycemia, unspecified: Secondary | ICD-10-CM | POA: Diagnosis not present

## 2020-09-07 LAB — PHOSPHORUS: Phosphorus: 2.4 mg/dL — ABNORMAL LOW (ref 2.5–4.6)

## 2020-09-07 LAB — BLOOD CULTURE ID PANEL (REFLEXED) - BCID2

## 2020-09-07 LAB — CBG MONITORING, ED
Glucose-Capillary: 164 mg/dL — ABNORMAL HIGH (ref 70–99)
Glucose-Capillary: 203 mg/dL — ABNORMAL HIGH (ref 70–99)
Glucose-Capillary: 370 mg/dL — ABNORMAL HIGH (ref 70–99)
Glucose-Capillary: 401 mg/dL — ABNORMAL HIGH (ref 70–99)

## 2020-09-07 LAB — CBC WITH DIFFERENTIAL/PLATELET
Abs Immature Granulocytes: 0.08 10*3/uL — ABNORMAL HIGH (ref 0.00–0.07)
Basophils Absolute: 0 10*3/uL (ref 0.0–0.1)
Basophils Relative: 0 %
Eosinophils Absolute: 0 10*3/uL (ref 0.0–0.5)
Eosinophils Relative: 0 %
HCT: 25.4 % — ABNORMAL LOW (ref 39.0–52.0)
Hemoglobin: 8.3 g/dL — ABNORMAL LOW (ref 13.0–17.0)
Immature Granulocytes: 1 %
Lymphocytes Relative: 11 %
Lymphs Abs: 1.1 10*3/uL (ref 0.7–4.0)
MCH: 29.2 pg (ref 26.0–34.0)
MCHC: 32.7 g/dL (ref 30.0–36.0)
MCV: 89.4 fL (ref 80.0–100.0)
Monocytes Absolute: 0.7 10*3/uL (ref 0.1–1.0)
Monocytes Relative: 7 %
Neutro Abs: 8.4 10*3/uL — ABNORMAL HIGH (ref 1.7–7.7)
Neutrophils Relative %: 81 %
Platelets: 140 10*3/uL — ABNORMAL LOW (ref 150–400)
RBC: 2.84 MIL/uL — ABNORMAL LOW (ref 4.22–5.81)
RDW: 16.8 % — ABNORMAL HIGH (ref 11.5–15.5)
WBC: 10.4 10*3/uL (ref 4.0–10.5)
nRBC: 0 % (ref 0.0–0.2)

## 2020-09-07 LAB — SODIUM, URINE, RANDOM: Sodium, Ur: 23 mmol/L

## 2020-09-07 LAB — STREP PNEUMONIAE URINARY ANTIGEN: Strep Pneumo Urinary Antigen: NEGATIVE

## 2020-09-07 LAB — COMPREHENSIVE METABOLIC PANEL
ALT: 39 U/L (ref 0–44)
AST: 35 U/L (ref 15–41)
Albumin: 2.6 g/dL — ABNORMAL LOW (ref 3.5–5.0)
Alkaline Phosphatase: 184 U/L — ABNORMAL HIGH (ref 38–126)
Anion gap: 6 (ref 5–15)
BUN: 32 mg/dL — ABNORMAL HIGH (ref 8–23)
CO2: 26 mmol/L (ref 22–32)
Calcium: 8.3 mg/dL — ABNORMAL LOW (ref 8.9–10.3)
Chloride: 98 mmol/L (ref 98–111)
Creatinine, Ser: 1.3 mg/dL — ABNORMAL HIGH (ref 0.61–1.24)
GFR, Estimated: 58 mL/min — ABNORMAL LOW (ref >60)
Glucose, Bld: 345 mg/dL — ABNORMAL HIGH (ref 70–99)
Potassium: 3.6 mmol/L (ref 3.5–5.1)
Sodium: 130 mmol/L — ABNORMAL LOW (ref 135–145)
Total Bilirubin: 1.9 mg/dL — ABNORMAL HIGH (ref 0.3–1.2)
Total Protein: 6.3 g/dL — ABNORMAL LOW (ref 6.5–8.1)

## 2020-09-07 LAB — HEMOGLOBIN A1C
Hgb A1c MFr Bld: 6.9 % — ABNORMAL HIGH (ref 4.8–5.6)
Mean Plasma Glucose: 151.33 mg/dL

## 2020-09-07 LAB — MAGNESIUM: Magnesium: 2.2 mg/dL (ref 1.7–2.4)

## 2020-09-07 LAB — OSMOLALITY: Osmolality: 313 mOsm/kg — ABNORMAL HIGH (ref 275–295)

## 2020-09-07 LAB — OSMOLALITY, URINE: Osmolality, Ur: 695 mOsm/kg (ref 300–900)

## 2020-09-07 LAB — TSH: TSH: 1.446 u[IU]/mL (ref 0.350–4.500)

## 2020-09-07 LAB — CREATININE, URINE, RANDOM: Creatinine, Urine: 91.74 mg/dL

## 2020-09-07 LAB — PROCALCITONIN: Procalcitonin: 1.27 ng/mL

## 2020-09-07 LAB — GLUCOSE, CAPILLARY
Glucose-Capillary: 276 mg/dL — ABNORMAL HIGH (ref 70–99)
Glucose-Capillary: 264 mg/dL — ABNORMAL HIGH (ref 70–99)

## 2020-09-07 LAB — PREALBUMIN: Prealbumin: <5 mg/dL — ABNORMAL LOW (ref 18–38)

## 2020-09-07 MED ORDER — CHLORHEXIDINE GLUCONATE CLOTH 2 % EX PADS
6.0000 | MEDICATED_PAD | Freq: Every day | CUTANEOUS | Status: DC
  Administered 2020-09-07 – 2020-09-08 (×2): 6 via TOPICAL

## 2020-09-07 MED ORDER — PROCHLORPERAZINE EDISYLATE 10 MG/2ML IJ SOLN
10.0000 mg | Freq: Four times a day (QID) | INTRAMUSCULAR | Status: DC | PRN
  Administered 2020-09-07 – 2020-09-09 (×3): 10 mg via INTRAVENOUS
  Filled 2020-09-07 (×3): qty 2

## 2020-09-07 MED ORDER — SODIUM CHLORIDE 0.9% FLUSH
10.0000 mL | Freq: Two times a day (BID) | INTRAVENOUS | Status: DC
  Administered 2020-09-07: 10 mL

## 2020-09-07 MED ORDER — SODIUM CHLORIDE 0.9% FLUSH: 10.0000 mL | INTRAVENOUS | Status: DC | PRN

## 2020-09-07 NOTE — Progress Notes (Addendum)
[facility]  PROGRESS NOTE  Christopher Welch IWL:[id number] DOB: Aug 08, 1946 DOA: 09/16/2020 PCP: [name], DO Admit date - 09/22/2020   Admitting Physician Toy Baker, MD  Outpatient Primary MD for the patient is [name], DO  LOS - 0 Brief Narrative   Christopher Welch is a 74 y.o. year old male with medical history significant for diabetes, CKD stage III, metastatic cholangiocarcinoma with chronic pain who presented on 09/08/2020 with days of nausea, vomiting, diminished Po intake, cough productive of sputum and fevers at home and was found to have secondary to community-acquired pneumonia.    Subjective  Today feels significantly better since starting IV antibiotics last night.  Denies any abdominal pain.  No chest pain.  Feels breathing is better  A & P  Sepsis secondary to community acquired pneumonia, right middle lobe based on CT imaging.  Patient presented with fever of 102.1 and WBC 15.6, blood pressure has been in the soft range but maintaining maps greater than 65.  White count is already decreased currently 10.4, has remained afebrile since admission and starting antibiotics. -Continue empiric ceftriaxone, doxycycline for CAP coverage -Strep pneumo negative, pending Legionella, Covid PCR negative -Monitor blood cultures -Sputum culture  Metastatic extrahepatic cholangiocarcinoma previously on hospice but discharged November 2021 -Not currently receiving any treatment, focusing on quality of life -Appreciate oncology assistance during hospitalization  Hyponatremia, mild.  Sodium 130.  Likely related to diminished p.o. intake related to generalized malaise with nausea/vomiting, currently stable -Encourage p.o., if unable to will trial IVF  CKD stage III, stable.  Baseline creatinine 1.1-1.4. -Avoid nephrotoxins-monitor BMP and output  Type 2 diabetes, controlled on -continue Lantus 10 units daily, Sasko insulin, monitor CBG  Chronic pain in setting of  chronic illness, cancer-related pain -Continue home regimen of morphine MS Contin 30 mg twice daily, oxycodone IR 5 to 10 mg every 6 hours as needed moderate pain   Normocytic anemia, stable at baseline.  Likely related to anemia of chronic disease in the setting of cancer -Monitor CBC, no episodes of bleeding  Thrombocytopenia, stable at baseline -monitor CBC, no bleeding episodes     Family Communication  : None  Code Status : DNR, as discussed on day of admission  Disposition Plan  :  Patient is from home. Anticipated d/c date: 2 to 3 days. Barriers to d/c or necessity for inpatient status:  IV antibiotics and monitoring cultures, blood pressure and sepsis physiology Consults  : Oncology  Procedures  : None  DVT Prophylaxis  : SCDs  MDM: The below labs and imaging reports were reviewed and summarized above.  Medication management as above.  Lab Results  Component Value Date   PLT 140 (L) 09/07/2020    Diet :  Diet Order            Diet Carb Modified Fluid consistency: Thin; Room service appropriate? Yes  Diet effective now                  Inpatient Medications Scheduled Meds: . insulin aspart  0-9 Units Subcutaneous Q4H  . insulin glargine  10 Units Subcutaneous QHS  . mirtazapine  15 mg Oral QHS  . morphine  30 mg Oral Q12H   Continuous Infusions: . cefTRIAXone (ROCEPHIN)  IV Stopped (09/07/20 0956)  . doxycycline (VIBRAMYCIN) IV 100 mg (09/07/20 0951)   PRN Meds:.acetaminophen **OR** acetaminophen, albuterol, oxyCODONE  Antibiotics  :   Anti-infectives (From admission, onward)   Start     Dose/Rate Route  Frequency Ordered Stop   09/07/20 1000  doxycycline (VIBRAMYCIN) 100 mg in sodium chloride 0.9 % 250 mL IVPB        100 mg 125 mL/hr over 120 Minutes Intravenous Every 12 hours 09/08/2020 2247     09/07/20 0800  cefTRIAXone (ROCEPHIN) 2 g in sodium chloride 0.9 % 100 mL IVPB        2 g 200 mL/hr over 30 Minutes Intravenous Every 24 hours 09/23/2020  2248 09/12/20 0759   09/07/20 0000  cefTRIAXone (ROCEPHIN) 2 g in sodium chloride 0.9 % 100 mL IVPB  Status:  Discontinued        2 g 200 mL/hr over 30 Minutes Intravenous Every 24 hours 09/08/2020 2247 08/31/2020 2248   09/03/2020 2145  vancomycin (VANCOCIN) IVPB 1000 mg/200 mL premix        1,000 mg 200 mL/hr over 60 Minutes Intravenous  Once 09/25/2020 2144 09/05/2020 2310   09/23/2020 2145  piperacillin-tazobactam (ZOSYN) IVPB 3.375 g        3.375 g 12.5 mL/hr over 240 Minutes Intravenous  Once 09/14/2020 2144 09/07/20 0232       Objective   Vitals:   09/07/20 1515 09/07/20 1530 09/07/20 1545 09/07/20 1600  BP:  122/64  120/68  Pulse: 71 76 74 77  Resp: 12 11 17 13   Temp:      TempSrc:      SpO2: 100% 95% 96% 100%  Weight:      Height:        SpO2: 100 %  Wt Readings from Last 3 Encounters:  09/05/2020 62 kg  08/30/20 62.8 kg  08/11/20 63.5 kg     Intake/Output Summary (Last 24 hours) at 09/07/2020 1612 Last data filed at 09/07/2020 0956 Gross per 24 hour  Intake 100 ml  Output --  Net 100 ml    Physical Exam:  Thin frail chronically ill-appearing male, no distress Awake Alert, Oriented X 3, Normal affect No new F.N deficits,  [place].AT, Normal respiratory effort on room air, diminished breath sounds at bases RRR,No Gallops,Rubs or new Murmurs,  +ve B.Sounds, Abd Soft, No tenderness, No rebound, guarding or rigidity.    I have personally reviewed the following:   Data Reviewed:  CBC Recent Labs  Lab 09/27/2020 1815 09/07/20 0455  WBC 15.6* 10.4  HGB 9.2* 8.3*  HCT 27.7* 25.4*  PLT 163 140*  MCV 89.4 89.4  MCH 29.7 29.2  MCHC 33.2 32.7  RDW 16.7* 16.8*  LYMPHSABS 0.8 1.1  MONOABS 1.7* 0.7  EOSABS 0.0 0.0  BASOSABS 0.0 0.0    Chemistries  Recent Labs  Lab 09/21/2020 1815 09/07/20 0455  NA 130* 130*  K 4.8 3.6  CL 96* 98  CO2 23 26  GLUCOSE 416* 345*  BUN 37* 32*  CREATININE 1.43* 1.30*  CALCIUM 8.7* 8.3*  MG 2.0 2.2  AST 50* 35  ALT 50* 39   ALKPHOS 232* 184*  BILITOT 3.2* 1.9*   ------------------------------------------------------------------------------------------------------------------ No results for input(s): CHOL, HDL, LDLCALC, TRIG, CHOLHDL, LDLDIRECT in the last 72 hours.  Lab Results  Component Value Date   HGBA1C 6.9 (H) 09/19/2020   ------------------------------------------------------------------------------------------------------------------ Recent Labs    09/07/20 0455  TSH 1.446   ------------------------------------------------------------------------------------------------------------------ No results for input(s): VITAMINB12, FOLATE, FERRITIN, TIBC, IRON, RETICCTPCT in the last 72 hours.  Coagulation profile Recent Labs  Lab 09/14/2020 1815  INR 1.6*    No results for input(s): DDIMER in the last 72 hours.  Cardiac Enzymes No results for input(s):  CKMB, TROPONINI, MYOGLOBIN in the last 168 hours.  Invalid input(s): CK ------------------------------------------------------------------------------------------------------------------ No results found for: BNP  Micro Results Recent Results (from the past 240 hour(s))  Resp Panel by RT-PCR (Flu A&B, Covid) Nasopharyngeal Swab     Status: None   Collection Time: 09/18/2020  6:17 PM   Specimen: Nasopharyngeal Swab; Nasopharyngeal(NP) swabs in vial transport medium  Result Value Ref Range Status   SARS Coronavirus 2 by RT PCR NEGATIVE NEGATIVE Final    Comment: (NOTE) SARS-CoV-2 target nucleic acids are NOT DETECTED.  The SARS-CoV-2 RNA is generally detectable in upper respiratory specimens during the acute phase of infection. The lowest concentration of SARS-CoV-2 viral copies this assay can detect is 138 copies/mL. A negative result does not preclude SARS-Cov-2 infection and should not be used as the sole basis for treatment or other patient management decisions. A negative result may occur with  improper specimen collection/handling,  submission of specimen other than nasopharyngeal swab, presence of viral mutation(s) within the areas targeted by this assay, and inadequate number of viral copies(<138 copies/mL). A negative result must be combined with clinical observations, patient history, and epidemiological information. The expected result is Negative.  Fact Sheet for Patients:  EntrepreneurPulse.com.au  Fact Sheet for Healthcare Providers:  IncredibleEmployment.be  This test is no t yet approved or cleared by the [place] FDA and  has been authorized for detection and/or diagnosis of SARS-CoV-2 by FDA under an Emergency Use Authorization (EUA). This EUA will remain  in effect (meaning this test can be used) for the duration of the COVID-19 declaration under Section 564(b)(1) of the Act, 21 U.S.C.section 360bbb-3(b)(1), unless the authorization is terminated  or revoked sooner.       Influenza A by PCR NEGATIVE NEGATIVE Final   Influenza B by PCR NEGATIVE NEGATIVE Final    Comment: (NOTE) The Xpert Xpress SARS-CoV-2/FLU/RSV plus assay is intended as an aid in the diagnosis of influenza from Nasopharyngeal swab specimens and should not be used as a sole basis for treatment. Nasal washings and aspirates are unacceptable for Xpert Xpress SARS-CoV-2/FLU/RSV testing.  Fact Sheet for Patients: EntrepreneurPulse.com.au  Fact Sheet for Healthcare Providers: IncredibleEmployment.be  This test is not yet approved or cleared by the [place] FDA and has been authorized for detection and/or diagnosis of SARS-CoV-2 by FDA under an Emergency Use Authorization (EUA). This EUA will remain in effect (meaning this test can be used) for the duration of the COVID-19 declaration under Section 564(b)(1) of the Act, 21 U.S.C. section 360bbb-3(b)(1), unless the authorization is terminated or revoked.  Performed at [facility], [place] [address], [place], [place] [address]   Culture, blood (Routine x 2)     Status: None (Preliminary result)   Collection Time: 09/25/2020  6:19 PM   Specimen: BLOOD  Result Value Ref Range Status   Specimen Description   Final    BLOOD PORTA CATH Performed at [facility] [address], [place], [place] [address]    Special Requests   Final    BOTTLES DRAWN AEROBIC AND ANAEROBIC Blood Culture adequate volume Performed at [facility] [address], [place], [place] [address]    Culture  Setup Time   Final    GRAM NEGATIVE RODS AEROBIC BOTTLE ONLY Organism ID to follow CRITICAL RESULT CALLED TO, READ BACK BY AND VERIFIED WITH: Karie Kirks PharmD 15:00 09/07/20 ([email or web address]) Performed at [facility], [address], [place], [place] [address]  Culture GRAM NEGATIVE RODS  Final   Report Status PENDING  Incomplete  Culture, blood (Routine x 2)     Status: None (Preliminary result)   Collection Time: 09/08/2020  6:19 PM   Specimen: BLOOD  Result Value Ref Range Status   Specimen Description   Final    BLOOD RIGHT ANTECUBITAL Performed at [facility] [address], [place], [place] [address]    Special Requests   Final    BOTTLES DRAWN AEROBIC AND ANAEROBIC Blood Culture adequate volume Performed at [facility] [address], [place], [place] [address]    Culture   Final    NO GROWTH < 12 HOURS Performed at [facility] [address], [place], [place] [address]    Report Status PENDING  Incomplete  Blood Culture ID Panel (Reflexed)     Status: Abnormal   Collection Time: 08/30/2020  6:19 PM  Result Value Ref Range Status   Enterococcus faecalis NOT DETECTED NOT DETECTED Final   Enterococcus Faecium NOT DETECTED NOT DETECTED Final   Listeria monocytogenes NOT DETECTED NOT DETECTED Final   Staphylococcus species NOT DETECTED NOT DETECTED Final   Staphylococcus aureus (BCID)  NOT DETECTED NOT DETECTED Final   Staphylococcus epidermidis NOT DETECTED NOT DETECTED Final   Staphylococcus lugdunensis NOT DETECTED NOT DETECTED Final   Streptococcus species NOT DETECTED NOT DETECTED Final   Streptococcus agalactiae NOT DETECTED NOT DETECTED Final   Streptococcus pneumoniae NOT DETECTED NOT DETECTED Final   Streptococcus pyogenes NOT DETECTED NOT DETECTED Final   A.calcoaceticus-baumannii NOT DETECTED NOT DETECTED Final   Bacteroides fragilis NOT DETECTED NOT DETECTED Final   Enterobacterales DETECTED (A) NOT DETECTED Final    Comment: Enterobacterales represent a large order of gram negative bacteria, not a single organism. CRITICAL RESULT CALLED TO, READ BACK BY AND VERIFIED WITH: Karie Kirks PharmD 15:00 09/07/20 ([email or web address])    Enterobacter cloacae complex NOT DETECTED NOT DETECTED Final   Escherichia coli NOT DETECTED NOT DETECTED Final   Klebsiella aerogenes NOT DETECTED NOT DETECTED Final   Klebsiella oxytoca NOT DETECTED NOT DETECTED Final   Klebsiella pneumoniae DETECTED (A) NOT DETECTED Final    Comment: CRITICAL RESULT CALLED TO, READ BACK BY AND VERIFIED WITH: Karie Kirks PharmD 15:00 09/07/20 ([email or web address])    Proteus species NOT DETECTED NOT DETECTED Final   Salmonella species NOT DETECTED NOT DETECTED Final   Serratia marcescens NOT DETECTED NOT DETECTED Final   Haemophilus influenzae NOT DETECTED NOT DETECTED Final   Neisseria meningitidis NOT DETECTED NOT DETECTED Final   Pseudomonas aeruginosa NOT DETECTED NOT DETECTED Final   Stenotrophomonas maltophilia NOT DETECTED NOT DETECTED Final   Candida albicans NOT DETECTED NOT DETECTED Final   Candida auris NOT DETECTED NOT DETECTED Final   Candida glabrata NOT DETECTED NOT DETECTED Final   Candida krusei NOT DETECTED NOT DETECTED Final   Candida parapsilosis NOT DETECTED NOT DETECTED Final   Candida tropicalis NOT DETECTED NOT DETECTED Final   Cryptococcus neoformans/gattii NOT DETECTED NOT DETECTED Final   CTX-M  ESBL NOT DETECTED NOT DETECTED Final   Carbapenem resistance IMP NOT DETECTED NOT DETECTED Final   Carbapenem resistance KPC NOT DETECTED NOT DETECTED Final   Carbapenem resistance NDM NOT DETECTED NOT DETECTED Final   Carbapenem resist OXA 48 LIKE NOT DETECTED NOT DETECTED Final   Carbapenem resistance VIM NOT DETECTED NOT DETECTED Final    Comment: Performed at [facility], [address] [place], [place] [address]  Radiology Reports CT ABDOMEN PELVIS W CONTRAST  Result Date: 09/22/2020 CLINICAL DATA:  74 year old male with cholangiocarcinoma. Altered mental status. EXAM: CT ABDOMEN AND PELVIS WITH CONTRAST TECHNIQUE: Multidetector CT imaging of the abdomen and pelvis was performed using the standard protocol following bolus administration of intravenous contrast. CONTRAST:  153mL OMNIPAQUE IOHEXOL 300 MG/ML  SOLN COMPARISON:  Restaging CT Chest, Abdomen, and Pelvis 04/12/2020, and earlier. FINDINGS: Lower chest: Chronic layering right pleural effusion with associated mild pleural thickening and enhancement is unchanged. Underlying simple fluid density. Adjacent right lower lung atelectasis is stable. However, there is also patchy new peribronchial opacity in the right middle lobe (series 4, image 26) and several small indistinct nodular opacities in the visible lingula (series 4, image 4), although there is superimposed respiratory motion. Hepatobiliary: Metal CBD stent remains in place. Pneumobilia has not significantly changed. But intrahepatic biliary ductal dilatation has progressed, especially since prior 02/03/2020 CT. No discrete liver lesion. Similar mild gallbladder distension. Pancreas: Increasing indistinct soft tissue medial to the distal CBD stent at the level of the pancreas on series 2, image 33. Associated increasing pancreatic ductal dilatation (series 2, image 34) with severe superimposed atrophy of pancreatic parenchyma which is partially calcified. Spleen: Stable mild  splenomegaly. Adrenals/Urinary Tract: Negative adrenal glands. Chronic asymmetric left renal volume loss. Bilateral renal enhancement and contrast excretion appears stable and within normal limits. Chronic benign right renal cysts. Unremarkable urinary bladder. Stomach/Bowel: No dilated large or small bowel. Retained stool throughout much of the colon. Trace fluid layering in both gutters. No discrete bowel inflammation identified. Mild to moderate gas and fluid distension of the stomach similar to the July CT. Abrupt tapering of the duodenum distal to the bulb appear stable. No free air. Vascular/Lymphatic: Calcified aortic atherosclerosis. Major arterial structures in the abdomen and pelvis are patent. The IVC appears to be patent. 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 SMV remains patent. No discrete or progressed abdominal or pelvic lymphadenopathy. Reproductive: Negative. Other: Small volume pelvic free fluid with simple fluid density is new since July (series 2, image 74). Chronic soft tissue thickening along the ventral abdominal wall likely from subcutaneous injections. Musculoskeletal: No acute or suspicious osseous lesion identified. IMPRESSION: 1. Sequelae of cholangiocarcinoma with evidence of progression about the metal CBD stent since the previous contrast enhanced CT Abdomen 02/23/2020. Associated dilatation of obstructed main pancreatic and intrahepatic bile ducts also appears progressed since May and July. 2. Chronic right pleural effusion and fibrothorax with patchy new right middle lobe and lingula opacity suspicious for developing bilateral respiratory infection. New pulmonary metastatic disease felt less likely. 3. New small volume ascites in the abdomen and pelvis. Electronically Signed   By: Genevie Ann M.D.   On: 09/08/2020 21:18   DG Chest Port 1 View  Result Date: 08/30/2020 CLINICAL DATA:  Altered mental status. EXAM: PORTABLE CHEST 1 VIEW COMPARISON:  October 29, 2019 FINDINGS: There is stable right-sided venous Port-A-Cath positioning. A trace amount of scarring and/or atelectasis is noted within the right lung base. There is a very small right pleural effusion. This is decreased in size when compared to the prior exam. No pneumothorax is identified. The heart size and mediastinal contours are within normal limits. Radiopaque surgical clips and a radiopaque tips stent is seen overlying the right upper quadrant. The visualized skeletal structures are unremarkable. IMPRESSION: 1. Trace amount of right basilar scarring and/or atelectasis. 2. Very small right pleural effusion, decreased in size when compared to the prior study. Electronically  Signed   By: Virgina Norfolk M.D.   On: 09/04/2020 19:14     Time Spent in minutes  30     Desiree Hane M.D on 09/07/2020 at 4:12 PM  To page go to [email or web address] - password [facility]

## 2020-09-07 NOTE — ED Notes (Signed)
Patient is resting comfortably. 

## 2020-09-07 NOTE — Evaluation (Signed)
Occupational Therapy Evaluation Patient Details Name: Christopher Welch MRN: [id number] DOB: August 01, 1946 Today's Date: 09/07/2020    History of Present Illness Patient is a 74 year old male admitted with12/8 fever up to 102.1. PMH includes metastatic cholangiocarcinoma inoperable, diabetes, hypertension chronic pancreatitis.   Clinical Impression   Patient lives at home with spouse in a townhouse, has cane but used intermittently "if I went to the grocery store sometimes I would use it." Patient reports working up until ~1 month ago at [employer]. Patient appears close to baseline, no physical assistance with self care at this time. Will discontinue acute OT services at this time. Please re-consult if new needs arise.    Follow Up Recommendations  No OT follow up    Equipment Recommendations  None recommended by OT       Precautions / Restrictions Restrictions Weight Bearing Restrictions: No      Mobility Bed Mobility Overal bed mobility: Modified Independent                  Transfers Overall transfer level: Modified independent Equipment used: None             General transfer comment: initial hand held assist due to multiple wires, patient able to ambulate in room without any physical assist    Balance Overall balance assessment: Mild deficits observed, not formally tested                                         ADL either performed or assessed with clinical judgement   ADL Overall ADL's : Modified independent                                       General ADL Comments: patient demonstrates functional mobility in room, clothing management without physical assistance. patient reports spouse can assist if needed                  Pertinent Vitals/Pain Pain Assessment: Faces Faces Pain Scale: No hurt     Hand Dominance  (did not specify)   Extremity/Trunk Assessment Upper Extremity Assessment Upper Extremity  Assessment: Overall WFL for tasks assessed   Lower Extremity Assessment Lower Extremity Assessment: Defer to PT evaluation       Communication Communication Communication: No difficulties   Cognition Arousal/Alertness: Awake/alert Behavior During Therapy: WFL for tasks assessed/performed Overall Cognitive Status: Within Functional Limits for tasks assessed                                     General Comments  VSS            Home Living Family/patient expects to be discharged to:: Private residence Living Arrangements: Spouse/significant other Available Help at Discharge: Family;Available 24 hours/day (husband is Engineer, materials) Type of Home: Other(Comment) (townhouse) Home Access: Level entry     Home Layout: One level     Bathroom Shower/Tub: Tub/shower unit         Home Equipment: Cane - single point          Prior Functioning/Environment Level of Independence: Independent        Comments: uses cane intermittently in community. worked at [employer] up until ~1 month ago  OT Goals(Current goals can be found in the care plan section) Acute Rehab OT Goals Patient Stated Goal: "find out what's wrong with me and fix it" OT Goal Formulation: All assessment and education complete, DC therapy             Co-evaluation PT/OT/SLP Co-Evaluation/Treatment: Yes Reason for Co-Treatment: To address functional/ADL transfers;For patient/therapist safety   OT goals addressed during session: ADL's and self-care      AM-PAC OT "6 Clicks" Daily Activity     Outcome Measure Help from another person eating meals?: None Help from another person taking care of personal grooming?: None Help from another person toileting, which includes using toliet, bedpan, or urinal?: None Help from another person bathing (including washing, rinsing, drying)?: None Help from another person to put on and taking off regular upper body clothing?:  None Help from another person to put on and taking off regular lower body clothing?: None 6 Click Score: 24   End of Session Nurse Communication: Mobility status  Activity Tolerance: Patient tolerated treatment well Patient left: in bed;with call bell/phone within reach                  Time: 0733-0753 OT Time Calculation (min): 20 min Charges:  OT General Charges $OT Visit: 1 Visit OT Evaluation $OT Eval Low Complexity: 1 Low  Delbert Phenix OT OT pager: [place] 09/07/2020, 8:57 AM

## 2020-09-07 NOTE — Progress Notes (Addendum)
HEMATOLOGY-ONCOLOGY PROGRESS NOTE  SUBJECTIVE: Mr. Christopher Welch is well-known to our practice.  Follow him for metastatic extrahepatic cholangiocarcinoma.  He stopped treatment on 04/14/2020 due to intolerance.  He recently enrolled in hospice in November 2021 but was discharged per his request about 2 weeks later.  We continue to follow him for supportive care.  The patient reported increased weakness at home.  He was also having vomiting and frequent stools.  He states that his stools were not really diarrhea however.  He was found to have a fever of 102.1 in the emergency room.  Noted to have possible right middle lobe pneumonia developing on CT scan.  Has been started on IV antibiotics.  He reports that he feels much better today.  Has not had any recurrent fevers.  He was able to eat without any nausea or vomiting.  No recurrent loose stools.  States that he ambulated with therapy and had no difficulty.  Oncology History Overview Note  Cancer Staging Cholangiocarcinoma [facility]) Staging form: Perihilar Bile Ducts, AJCC 8th Edition - Clinical: Stage Unknown (cTX, cN0, cM0) - Signed by [name], MD on 03/19/2019    Cholangiocarcinoma ([place])  01/14/2019 Imaging   US Abdomen 01/14/19  IMPRESSION: There is significant intrahepatic and extrahepatic biliary dilatation present, concerning for distal common bile duct obstruction. Further evaluation with CT scan or MRCP is recommended. Correlation with liver function tests is recommended as well. These results will be called to the ordering clinician or representative by the Radiologist Assistant, and communication documented in the PACS or zVision Dashboard.   Probable large amount of sludge seen within gallbladder lumen with mild gallbladder wall thickening. However, no cholelithiasis, pericholecystic fluid or sonographic Murphy's sign is noted.   4.2 cm septated cyst seen in upper pole of right kidney consistent with Bosniak type 2 lesion. Follow-up  ultrasound in 1 year is recommended to ensure stability.   01/20/2019 Imaging   MRI abdomen 01/20/19  IMPRESSION: 1. Findings are highly concerning for central tumor in the biliary tract at the confluence of the common hepatic duct, cystic duct and common bile ducts. Further clinical evaluation for potential cholangiocarcinoma is strongly recommended. 2. Mild ductal dilatation of the main pancreatic duct throughout the distal body and tail of the pancreas where there is also some associated side branch ectasia. This may suggest a pancreatic ductal stricture. No obstructing pancreatic neoplasm identified. 3. Aortic atherosclerosis.   01/21/2019 Initial Biopsy   Diagnosis 01/21/19  BILE DUCT BRUSHING (SPECIMEN 1 OF 1 COLLECTED 01/21/2019) ADENOCARCINOMA.   01/21/2019 Procedure   ERCP By Dr hung 01/21/19 IMRPESSION - The major papilla appeared normal. - A single localized biliary stricture was found in the middle third of the main bile duct. - The entire main bile duct and upper third of the main bile duct were dilated, secondary to a stricture. - A biliary sphincterotomy was performed. - Cells for cytology obtained in the middle third of the main bile duct. - One plastic stent was placed into the common bile duct.  EUS by Dr hung 01/21/19  IMPRESSION - There was dilation in the middle third of the main bile duct and in the upper third of the main bile duct which measured up to 15 mm. - There was a suggestion of a stricture in the middle third of the main bile duct. - No specimens collected.   02/03/2019 Imaging   CT CAP at [facility] 02/03/19  Impression:  1. There is mild intrahepatic and extrahepatic biliary ductal dilation  and diffuse common bile duct wall thickening with stent in place. There is abnormal soft tissue measuring approximately 1.6 cm between the common hepatic artery, portal vein and common bile duct, worrisome for the known cholangiocarcinoma.  2. Periportal  lymphadenopathy which may represent nodal metastasis.  4. Marked pancreatic atrophy and duct dilation involving the distal body and tail the pancreas where there is a coarse calcification. These findings are favored to represent stenosis from intraductal stones though an underlying stricture cannot be completely excluded.  5. Atypical symmetric prominent fat stranding of the bilateral lower abdominal wall. Correlate with surgical history or history of history of trauma.   03/08/2019 Initial Diagnosis   Cholangiocarcinoma ([place])   03/12/2019 - 04/14/2020 Chemotherapy   Gemcitabine and cisplatin on days 1 and 8 every 21 days starting 03/12/19. Abraxane added with cycle 2. Added GCSF ([name]) to day 9 starting cycle 2.  Abraxane stopped after C4 and chemo held 06/03/19-07/02/19 due to poor toleration/hospitalization. Cisplatin removed starting from cycle 13 due to renal insufficiency. Single agent Gemcitabine held since 04/14/20 to proceed with Target Radiation and chemo break, due to possible disease progression.    03/19/2019 Cancer Staging   Staging form: Perihilar Bile Ducts, AJCC 8th Edition - Clinical: Stage Unknown (cTX, cN0, cM0) - Signed by [name], MD on 03/19/2019   05/24/2019 Imaging   CT CAP W Contrast at [facility]  1.  Interval placement of covered metallic biliary stent. No progressive dilation of the biliary tree and resulting resolution of the prior main pancreatic duct dilation. 2.  Evidence of some subtle vascular contour deformity involving the hepatic artery, suspected to be from tumoral contact and/or posttreatment changes. 3.  New small right pleural effusion with overlying mild airspace disease. Secondary findings which may indicate a component of aspiration. 4.  No convincing evidence of new disseminated metastatic disease. 5.  Additional findings as discussed above.   06/18/2019 Imaging   CT AP IMPRESSION: 1.  No acute findings in the abdomen/pelvis. 2. Biliary stent in  adequate position. No evidence of focal mass or adenopathy in the region of the pancreatic head or porta hepatis. 3. Moderate size right pleural effusion with associated right basilar atelectasis. 4. Possible single gallstone. Stable subcentimeter liver cyst. Stable right renal cysts. Small left inguinal hernia containing only peritoneal fat. 5. Aortic Atherosclerosis (ICD10-I70.0).   07/28/2019 Imaging   CT CAP IMPRESSION: 1. The patient is status post common bile duct stent placement, with unchanged stent position and patent appearance with pneumobilia. Primary cholangiocarcinoma is not discretely appreciated by CT. 2. No direct evidence of metastatic disease in the chest, abdomen, or pelvis. 3. Moderate right pleural effusion with associated atelectasis or consolidation, slightly improved compared to prior examination. 4. The pancreatic parenchyma is atrophic and calcified, particularly in the pancreatic tail. 5.  Coronary artery disease.  Aortic atherosclerosis   10/20/2019 PET scan   IMPRESSION: 1. Redemonstrated post treatment and post stenting appearance of a central cholangiocarcinoma, which is not directly appreciated by CT. Common bile duct stent remains in position with post stenting pneumobilia. 2. Slight interval increase in a moderate to large right pleural effusion with associated atelectasis or consolidation. There may be some pleural nodularity about the right hemidiaphragm which appears new compared to prior examination (series 2, image 40), highly suspicious for pleural metastatic disease. 3. No direct evidence of metastatic disease in the chest, abdomen, or pelvis. 4. Coronary artery disease.  Aortic Atherosclerosis (ICD10-I70.0).     10/29/2019 Pathology Results  Thoracentesis FINAL MICROSCOPIC DIAGNOSIS:  - Reactive mesothelial cells present    11/17/2019 PET scan   IMPRESSION: 1. Low-level hypermetabolism within the right pleural space, without focal  abnormality to strongly suggest pleural metastasis. 2. Hypermetabolism along the course of the common duct stent, nonspecific and most likely reactive. 3. Otherwise, no evidence of hypermetabolic metastasis. 4. Small to moderate right pleural effusion with developing loculation superiorly. 5. Coronary artery atherosclerosis. Aortic Atherosclerosis (ICD10-I70.0). 6.  Possible constipation.     02/03/2020 Imaging   CT CAP IMPRESSION: 1. Stable indistinct circumferential biliary wall thickening in the proximal CBD, without discrete biliary mass. Stable well-positioned CBD stent with intrahepatic pneumobilia indicating patency. 2. No findings of metastatic disease in the abdomen or pelvis. 3. Small dependent right pleural effusion is decreased. Mild diffuse smooth right pleural thickening is similar to slightly increased, nonspecific, cannot exclude malignant pleural effusion. No discrete pleural nodularity. 4. Tiny clustered tree-in-bud type nodules in the anterior left upper lobe, slightly increased, nonspecific, more likely inflammatory. Recommend attention on follow-up chest CT in 3 months. 5. Chronic findings include: Chronic pancreatitis. Coronary atherosclerosis. Aortic Atherosclerosis (ICD10-I70.0).     02/21/2020 Imaging   US Abdomen  IMPRESSION: Mildly distended gallbladder without visible wall thickening or stones.   Common bile duct stent in place. Slight prominence of intrahepatic biliary ducts in the left hepatic lobe.   Increased echotexture of the liver suggesting fatty infiltration.   04/12/2020 Imaging   CT CAP W contrast  IMPRESSION: 1. Enlarging periportal lymph nodes and new duodenal wall thickening are worrisome for disease progression. An infectious or inflammatory process is not excluded. Soft tissue thickening surrounding the lower common bile duct with wall stent in place, stable. 2. Chronic-appearing moderate right pleural effusion, stable. 3. Stable  pulmonary nodularity. Continued attention on follow-up is recommended. 4. Fair amount of stool in the colon is indicative of constipation. 5. Chronic pancreatitis. 6. Aortic atherosclerosis (ICD10-I70.0). Coronary artery calcification.      REVIEW OF SYSTEMS:   Constitutional: He had fevers on admission Eyes: Denies blurriness of vision Ears, nose, mouth, throat, and face: Denies mucositis or sore throat Respiratory: Denies cough, dyspnea or wheezes Cardiovascular: Denies palpitation, chest discomfort Gastrointestinal: Nausea and vomiting present prior to admission but now resolved.  Had frequent stools prior to admission which have now resolved. Skin: Denies abnormal skin rashes Lymphatics: Denies new lymphadenopathy or easy bruising Neurological:Denies numbness, tingling or new weaknesses Behavioral/Psych: Mood is stable, no new changes  Extremities: No lower extremity edema All other systems were reviewed with the patient and are negative.  I have reviewed the past medical history, past surgical history, social history and family history with the patient and they are unchanged from previous note.   PHYSICAL EXAMINATION: ECOG PERFORMANCE STATUS: 2 - Symptomatic, <50% confined to bed  Vitals:   09/07/20 0745 09/07/20 0900  BP: 120/63 (!) 93/54  Pulse: 74 68  Resp: 16 10  Temp:    SpO2: 99% 97%   Filed Weights   09/03/2020 1745  Weight: 62 kg    Intake/Output from previous day: No intake/output data recorded.  GENERAL:alert, no distress and comfortable SKIN: skin color, texture, turgor are normal, no rashes or significant lesions EYES: normal, Conjunctiva are pink and non-injected, sclera clear OROPHARYNX: Mucous membranes dry, no thrush or mucositis LUNGS: clear to auscultation and percussion with normal breathing effort HEART: regular rate & rhythm and no murmurs and no lower extremity edema ABDOMEN: Positive bowel sounds, soft, mildly distended, nontender  NEURO:  alert & oriented x 3 with fluent speech, no focal motor/sensory deficits  LABORATORY DATA:  I have reviewed the data as listed CMP Latest Ref Rng & Units 09/07/2020 09/26/2020 08/30/2020  Glucose 70 - 99 mg/dL 345(H) 416(H) 97  BUN 8 - 23 mg/dL 32(H) 37(H) 24(H)  Creatinine 0.61 - 1.24 mg/dL 1.30(H) 1.43(H) 1.20  Sodium 135 - 145 mmol/L 130(L) 130(L) 132(L)  Potassium 3.5 - 5.1 mmol/L 3.6 4.8 4.0  Chloride 98 - 111 mmol/L 98 96(L) 102  CO2 22 - 32 mmol/L 26 23 24   Calcium 8.9 - 10.3 mg/dL 8.3(L) 8.7(L) 9.2  Total Protein 6.5 - 8.1 g/dL 6.3(L) 7.3 6.9  Total Bilirubin 0.3 - 1.2 mg/dL 1.9(H) 3.2(H) 1.3(H)  Alkaline Phos 38 - 126 U/L 184(H) 232(H) 265(H)  AST 15 - 41 U/L 35 50(H) 48(H)  ALT 0 - 44 U/L 39 50(H) 67(H)    Lab Results  Component Value Date   WBC 10.4 09/07/2020   HGB 8.3 (L) 09/07/2020   HCT 25.4 (L) 09/07/2020   MCV 89.4 09/07/2020   PLT 140 (L) 09/07/2020   NEUTROABS 8.4 (H) 09/07/2020    CT ABDOMEN PELVIS W CONTRAST  Result Date: 09/02/2020 CLINICAL DATA:  74 year old male with cholangiocarcinoma. Altered mental status. EXAM: CT ABDOMEN AND PELVIS WITH CONTRAST TECHNIQUE: Multidetector CT imaging of the abdomen and pelvis was performed using the standard protocol following bolus administration of intravenous contrast. CONTRAST:  11mL OMNIPAQUE IOHEXOL 300 MG/ML  SOLN COMPARISON:  Restaging CT Chest, Abdomen, and Pelvis 04/12/2020, and earlier. FINDINGS: Lower chest: Chronic layering right pleural effusion with associated mild pleural thickening and enhancement is unchanged. Underlying simple fluid density. Adjacent right lower lung atelectasis is stable. However, there is also patchy new peribronchial opacity in the right middle lobe (series 4, image 26) and several small indistinct nodular opacities in the visible lingula (series 4, image 4), although there is superimposed respiratory motion. Hepatobiliary: Metal CBD stent remains in place. Pneumobilia has not  significantly changed. But intrahepatic biliary ductal dilatation has progressed, especially since prior 02/03/2020 CT. No discrete liver lesion. Similar mild gallbladder distension. Pancreas: Increasing indistinct soft tissue medial to the distal CBD stent at the level of the pancreas on series 2, image 33. Associated increasing pancreatic ductal dilatation (series 2, image 34) with severe superimposed atrophy of pancreatic parenchyma which is partially calcified. Spleen: Stable mild splenomegaly. Adrenals/Urinary Tract: Negative adrenal glands. Chronic asymmetric left renal volume loss. Bilateral renal enhancement and contrast excretion appears stable and within normal limits. Chronic benign right renal cysts. Unremarkable urinary bladder. Stomach/Bowel: No dilated large or small bowel. Retained stool throughout much of the colon. Trace fluid layering in both gutters. No discrete bowel inflammation identified. Mild to moderate gas and fluid distension of the stomach similar to the July CT. Abrupt tapering of the duodenum distal to the bulb appear stable. No free air. Vascular/Lymphatic: Calcified aortic atherosclerosis. Major arterial structures in the abdomen and pelvis are patent. The IVC appears to be patent. 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 SMV remains patent. No discrete or progressed abdominal or pelvic lymphadenopathy. Reproductive: Negative. Other: Small volume pelvic free fluid with simple fluid density is new since July (series 2, image 74). Chronic soft tissue thickening along the ventral abdominal wall likely from subcutaneous injections. Musculoskeletal: No acute or suspicious osseous lesion identified. IMPRESSION: 1. Sequelae of cholangiocarcinoma with evidence of progression about the metal CBD stent since the previous contrast enhanced CT Abdomen 02/23/2020. Associated dilatation of obstructed  main pancreatic and intrahepatic bile ducts also appears progressed since  May and July. 2. Chronic right pleural effusion and fibrothorax with patchy new right middle lobe and lingula opacity suspicious for developing bilateral respiratory infection. New pulmonary metastatic disease felt less likely. 3. New small volume ascites in the abdomen and pelvis. Electronically Signed   By: Genevie Ann M.D.   On: 09/05/2020 21:18   DG Chest Port 1 View  Result Date: 09/01/2020 CLINICAL DATA:  Altered mental status. EXAM: PORTABLE CHEST 1 VIEW COMPARISON:  October 29, 2019 FINDINGS: There is stable right-sided venous Port-A-Cath positioning. A trace amount of scarring and/or atelectasis is noted within the right lung base. There is a very small right pleural effusion. This is decreased in size when compared to the prior exam. No pneumothorax is identified. The heart size and mediastinal contours are within normal limits. Radiopaque surgical clips and a radiopaque tips stent is seen overlying the right upper quadrant. The visualized skeletal structures are unremarkable. IMPRESSION: 1. Trace amount of right basilar scarring and/or atelectasis. 2. Very small right pleural effusion, decreased in size when compared to the prior study. Electronically Signed   By: Virgina Norfolk M.D.   On: 09/13/2020 19:14    ASSESSMENT AND PLAN: 1.  Metastatic cholangiocarcinoma 2.  Community-acquired pneumonia 3.  Hyponatremia 4.  Anemia 5.  Thrombocytopenia 6.  AKI, improving 7.  Type 2 diabetes mellitus 8.  CAD 9.  Goals of care discussion  -Mr. Struble is feeling much better since coming to the hospital.  Continue IV fluids and antibiotics.  Follow-up on cultures. -Anemia and thrombocytopenia is overall mild.  Monitor for now. -We had another discussion today regarding his cholangiocarcinoma.  He was previously enrolled in hospice but decided to be discharged due to things not working out well.  We have had ongoing discussions with the patient regarding treatment for his cholangiocarcinoma including  palliative radiation versus low-dose chemotherapy such as Xeloda.  He is very concerned about how this may impact his quality of life and would rather forego treatment rather than experience side effects.  No treatment is currently planned for his cholangiocarcinoma. -I talked with the patient about getting another agency to follow him for hospice.  At this point, he would like to hold off on hospice.  He would benefit from outpatient palliative care.   LOS: 0 days   Mikey Bussing, DNP, AGPCNP-BC, AOCNP 09/07/20  Addendum  I have seen the patient, examined him. I agree with the assessment and and plan and have edited the notes.   Mr. Shavers is well-known to me, he is currently on supportive care alone for his cholangiocarcinoma.  He was admitted for sepsis, and possible pneumonia.  He does feel better today after hydration and antibiotics.  He previously enrolled to hospice with withdrew after 2 weeks.  We reviewed his social support.  His husband dose live with him 3-4 nights with him (he is taking care of his mother also), and he has a nice lady neighbor who has been checking and helping him at home.  He does not feel he needs home care service or palliative care at this point.  I give him emotional support, and he knows to call me if needed at home.  I plan to see him back after hospital discharge.  [name]  09/07/2020

## 2020-09-07 NOTE — Evaluation (Signed)
Physical Therapy Evaluation Patient Details Name: Christopher Welch MRN: [id number] DOB: [birth year] Today's Date: 09/07/2020   History of Present Illness  Patient is a 74 year old male admitted with12/8 fever up to 102.1. PMH includes metastatic cholangiocarcinoma inoperable, diabetes, hypertension chronic pancreatitis.  Clinical Impression  Patient reports that he lives  With spouse who plans to take FMLA to assist patient at DC. Patient uses cane for out in community, was working until a few weeks ago. Patient  Reports pain controlled this AM. Patient ambulated in room with close supervision. Patient should progress  To return home.  Pt admitted with above diagnosis.   Pt currently with functional limitations due to the deficits listed below (see PT Problem List). Pt will benefit from skilled PT to increase their independence and safety with mobility to allow discharge to the venue listed below.       Follow Up Recommendations No PT follow up    Equipment Recommendations  None recommended by PT    Recommendations for Other Services       Precautions / Restrictions Precautions Precautions: Fall Restrictions Weight Bearing Restrictions: No      Mobility  Bed Mobility Overal bed mobility: Modified Independent                  Transfers Overall transfer level: Modified independent Equipment used: None             General transfer comment: initial hand held assist due to multiple wires, patient able to ambulate in room without any physical assist  Ambulation/Gait Ambulation/Gait assistance: Min guard Gait Distance (Feet): 40 Feet Assistive device: 1 person hand held assist;None Gait Pattern/deviations: Step-through pattern Gait velocity: decr   General Gait Details: gait steady for short distance in room. Initially held a hand but did not require support after a few steps.  Stairs            Wheelchair Mobility    Modified Rankin (Stroke Patients  Only)       Balance Overall balance assessment: Mild deficits observed, not formally tested                                           Pertinent Vitals/Pain Pain Assessment: Faces Faces Pain Scale: No hurt Pain Intervention(s): Premedicated before session    Home Living Family/patient expects to be discharged to:: Private residence Living Arrangements: Spouse/significant other Available Help at Discharge: Family;Available 24 hours/day (husband is Engineer, materials) Type of Home: Other(Comment) (townhouse) Home Access: Level entry     Home Layout: One level Home Equipment: Cane - single point      Prior Function Level of Independence: Independent         Comments: uses cane intermittently in community. worked at [employer] up until ~1 month ago     [place]:  (did not specify)    Extremity/Trunk Assessment   Upper Extremity Assessment Upper Extremity Assessment: Overall WFL for tasks assessed    Lower Extremity Assessment Lower Extremity Assessment: Overall WFL for tasks assessed    Cervical / Trunk Assessment Cervical / Trunk Assessment: Normal  Communication   Communication: No difficulties  Cognition Arousal/Alertness: Awake/alert Behavior During Therapy: WFL for tasks assessed/performed Overall Cognitive Status: Within Functional Limits for tasks assessed  General Comments General comments (skin integrity, edema, etc.): VSS    Exercises     Assessment/Plan    PT Assessment Patient needs continued PT services  PT Problem List Decreased strength;Decreased mobility;Decreased activity tolerance       PT Treatment Interventions Gait training;Functional mobility training;Therapeutic activities;Therapeutic exercise;Patient/family education    PT Goals (Current goals can be found in the Care Plan section)  Acute Rehab PT Goals Patient Stated Goal: "find  out what's wrong with me and fix it" PT Goal Formulation: With patient Time For Goal Achievement: 09/21/20 Potential to Achieve Goals: Good    Frequency Min 3X/week   Barriers to discharge        Co-evaluation PT/OT/SLP Co-Evaluation/Treatment: Yes Reason for Co-Treatment: For patient/therapist safety;To address functional/ADL transfers PT goals addressed during session: Mobility/safety with mobility OT goals addressed during session: ADL's and self-care       AM-PAC PT "6 Clicks" Mobility  Outcome Measure Help needed turning from your back to your side while in a flat bed without using bedrails?: None Help needed moving from lying on your back to sitting on the side of a flat bed without using bedrails?: None Help needed moving to and from a bed to a chair (including a wheelchair)?: A Little Help needed standing up from a chair using your arms (e.g., wheelchair or bedside chair)?: A Little Help needed to walk in hospital room?: A Little Help needed climbing 3-5 steps with a railing? : A Little 6 Click Score: 20    End of Session   Activity Tolerance: Patient tolerated treatment well Patient left: in bed;with call bell/phone within reach Nurse Communication: Mobility status PT Visit Diagnosis: Unsteadiness on feet (R26.81)    Time: 6967-8938 PT Time Calculation (min) (ACUTE ONLY): 19 min   Charges:   PT Evaluation $PT Eval Low Complexity: [place] Pager [phone] Office [phone]   Claretha Cooper 09/07/2020, 9:31 AM

## 2020-09-07 NOTE — Progress Notes (Signed)
PHARMACY - PHYSICIAN COMMUNICATION CRITICAL VALUE ALERT - BLOOD CULTURE IDENTIFICATION (BCID)  Christopher Welch is an 74 y.o. male with metastatic cholangiocarcinoma who presented to [facility] on 09/01/2020 with a chief complaint of AMS. Blood culture collected on 12/8 has GNR in 1/4 bottles (BCID = klebsiella pneumo).  Name of physician (or Provider) Contacted: Dr. Lonny Prude  Current antibiotics: ceftriaxone 2gm q24h, doxy 100mg  q12h for PNA  Changes to prescribed antibiotics recommended:  - continue ceftriaxone 2gm IV q24h  Results for orders placed or performed during the hospital encounter of 09/07/2020  Blood Culture ID Panel (Reflexed) (Collected: 09/05/2020  6:19 PM)  Result Value Ref Range   Enterococcus faecalis NOT DETECTED NOT DETECTED   Enterococcus Faecium NOT DETECTED NOT DETECTED   Listeria monocytogenes NOT DETECTED NOT DETECTED   Staphylococcus species NOT DETECTED NOT DETECTED   Staphylococcus aureus (BCID) NOT DETECTED NOT DETECTED   Staphylococcus epidermidis NOT DETECTED NOT DETECTED   Staphylococcus lugdunensis NOT DETECTED NOT DETECTED   Streptococcus species NOT DETECTED NOT DETECTED   Streptococcus agalactiae NOT DETECTED NOT DETECTED   Streptococcus pneumoniae NOT DETECTED NOT DETECTED   Streptococcus pyogenes NOT DETECTED NOT DETECTED   A.calcoaceticus-baumannii NOT DETECTED NOT DETECTED   Bacteroides fragilis NOT DETECTED NOT DETECTED   Enterobacterales DETECTED (A) NOT DETECTED   Enterobacter cloacae complex NOT DETECTED NOT DETECTED   Escherichia coli NOT DETECTED NOT DETECTED   Klebsiella aerogenes NOT DETECTED NOT DETECTED   Klebsiella oxytoca NOT DETECTED NOT DETECTED   Klebsiella pneumoniae DETECTED (A) NOT DETECTED   Proteus species NOT DETECTED NOT DETECTED   Salmonella species NOT DETECTED NOT DETECTED   Serratia marcescens NOT DETECTED NOT DETECTED   Haemophilus influenzae NOT DETECTED NOT DETECTED   Neisseria meningitidis NOT DETECTED NOT DETECTED    Pseudomonas aeruginosa NOT DETECTED NOT DETECTED   Stenotrophomonas maltophilia NOT DETECTED NOT DETECTED   Candida albicans NOT DETECTED NOT DETECTED   Candida auris NOT DETECTED NOT DETECTED   Candida glabrata NOT DETECTED NOT DETECTED   Candida krusei NOT DETECTED NOT DETECTED   Candida parapsilosis NOT DETECTED NOT DETECTED   Candida tropicalis NOT DETECTED NOT DETECTED   Cryptococcus neoformans/gattii NOT DETECTED NOT DETECTED   CTX-M ESBL NOT DETECTED NOT DETECTED   Carbapenem resistance IMP NOT DETECTED NOT DETECTED   Carbapenem resistance KPC NOT DETECTED NOT DETECTED   Carbapenem resistance NDM NOT DETECTED NOT DETECTED   Carbapenem resist OXA 48 LIKE NOT DETECTED NOT DETECTED   Carbapenem resistance VIM NOT DETECTED NOT DETECTED    Lynelle Doctor 09/07/2020  3:19 PM

## 2020-09-08 ENCOUNTER — Other Ambulatory Visit: Payer: Self-pay

## 2020-09-08 DIAGNOSIS — D649 Anemia, unspecified: Secondary | ICD-10-CM

## 2020-09-08 DIAGNOSIS — Z8509 Personal history of malignant neoplasm of other digestive organs: Secondary | ICD-10-CM

## 2020-09-08 DIAGNOSIS — Z7189 Other specified counseling: Secondary | ICD-10-CM

## 2020-09-08 DIAGNOSIS — B961 Klebsiella pneumoniae [K. pneumoniae] as the cause of diseases classified elsewhere: Secondary | ICD-10-CM

## 2020-09-08 DIAGNOSIS — R531 Weakness: Secondary | ICD-10-CM

## 2020-09-08 DIAGNOSIS — R739 Hyperglycemia, unspecified: Secondary | ICD-10-CM

## 2020-09-08 DIAGNOSIS — Z515 Encounter for palliative care: Secondary | ICD-10-CM

## 2020-09-08 DIAGNOSIS — R112 Nausea with vomiting, unspecified: Secondary | ICD-10-CM

## 2020-09-08 DIAGNOSIS — R7881 Bacteremia: Secondary | ICD-10-CM

## 2020-09-08 LAB — CBC
HCT: 23.2 % — ABNORMAL LOW (ref 39.0–52.0)
HCT: 23.1 % — ABNORMAL LOW (ref 39.0–52.0)
Hemoglobin: 7.6 g/dL — ABNORMAL LOW (ref 13.0–17.0)
Hemoglobin: 7.5 g/dL — ABNORMAL LOW (ref 13.0–17.0)
MCH: 29.6 pg (ref 26.0–34.0)
MCH: 29.8 pg (ref 26.0–34.0)
MCHC: 32.8 g/dL (ref 30.0–36.0)
MCHC: 32.5 g/dL (ref 30.0–36.0)
MCV: 90.3 fL (ref 80.0–100.0)
MCV: 91.7 fL (ref 80.0–100.0)
Platelets: 149 10*3/uL — ABNORMAL LOW (ref 150–400)
Platelets: 145 10*3/uL — ABNORMAL LOW (ref 150–400)
RBC: 2.57 MIL/uL — ABNORMAL LOW (ref 4.22–5.81)
RBC: 2.52 MIL/uL — ABNORMAL LOW (ref 4.22–5.81)
RDW: 17 % — ABNORMAL HIGH (ref 11.5–15.5)
RDW: 17 % — ABNORMAL HIGH (ref 11.5–15.5)
WBC: 11.5 10*3/uL — ABNORMAL HIGH (ref 4.0–10.5)
WBC: 8.6 10*3/uL (ref 4.0–10.5)
nRBC: 0 % (ref 0.0–0.2)
nRBC: 0 % (ref 0.0–0.2)

## 2020-09-08 LAB — BASIC METABOLIC PANEL
Anion gap: 7 (ref 5–15)
BUN: 38 mg/dL — ABNORMAL HIGH (ref 8–23)
CO2: 25 mmol/L (ref 22–32)
Calcium: 7.8 mg/dL — ABNORMAL LOW (ref 8.9–10.3)
Chloride: 98 mmol/L (ref 98–111)
Creatinine, Ser: 1.4 mg/dL — ABNORMAL HIGH (ref 0.61–1.24)
GFR, Estimated: 53 mL/min — ABNORMAL LOW (ref >60)
Glucose, Bld: 419 mg/dL — ABNORMAL HIGH (ref 70–99)
Potassium: 4.1 mmol/L (ref 3.5–5.1)
Sodium: 130 mmol/L — ABNORMAL LOW (ref 135–145)

## 2020-09-08 LAB — LEGIONELLA PNEUMOPHILA SEROGP 1 UR AG: L. pneumophila Serogp 1 Ur Ag: NEGATIVE

## 2020-09-08 LAB — GLUCOSE, CAPILLARY
Glucose-Capillary: 324 mg/dL — ABNORMAL HIGH (ref 70–99)
Glucose-Capillary: 333 mg/dL — ABNORMAL HIGH (ref 70–99)
Glucose-Capillary: 335 mg/dL — ABNORMAL HIGH (ref 70–99)
Glucose-Capillary: 380 mg/dL — ABNORMAL HIGH (ref 70–99)
Glucose-Capillary: 400 mg/dL — ABNORMAL HIGH (ref 70–99)
Glucose-Capillary: 422 mg/dL — ABNORMAL HIGH (ref 70–99)

## 2020-09-08 MED ORDER — SODIUM CHLORIDE 0.9 % IV SOLN
2.0000 g | INTRAVENOUS | Status: DC
  Administered 2020-09-09: 10:00:00 2 g via INTRAVENOUS
  Filled 2020-09-08: qty 2

## 2020-09-08 MED ORDER — PANTOPRAZOLE SODIUM 40 MG PO TBEC
40.0000 mg | DELAYED_RELEASE_TABLET | Freq: Every day | ORAL | Status: DC
  Administered 2020-09-08 – 2020-09-09 (×2): 40 mg via ORAL
  Filled 2020-09-08 (×2): qty 1

## 2020-09-08 MED ORDER — INSULIN GLARGINE 100 UNIT/ML ~~LOC~~ SOLN
15.0000 [IU] | Freq: Every day | SUBCUTANEOUS | Status: DC
  Administered 2020-09-08: 15 [IU] via SUBCUTANEOUS
  Filled 2020-09-08: qty 0.15

## 2020-09-08 MED ORDER — SODIUM CHLORIDE 0.9% IV SOLUTION: Freq: Once | INTRAVENOUS | Status: DC

## 2020-09-08 MED ORDER — INSULIN ASPART 100 UNIT/ML ~~LOC~~ SOLN: 0.0000 [IU] | Freq: Every day | SUBCUTANEOUS | Status: DC

## 2020-09-08 MED ORDER — INSULIN ASPART 100 UNIT/ML ~~LOC~~ SOLN
8.0000 [IU] | Freq: Once | SUBCUTANEOUS | Status: AC
  Administered 2020-09-08: 8 [IU] via SUBCUTANEOUS

## 2020-09-08 MED ORDER — INSULIN ASPART 100 UNIT/ML ~~LOC~~ SOLN
0.0000 [IU] | Freq: Three times a day (TID) | SUBCUTANEOUS | Status: DC
  Administered 2020-09-08 (×2): 9 [IU] via SUBCUTANEOUS
  Administered 2020-09-08: 7 [IU] via SUBCUTANEOUS
  Administered 2020-09-09: 10:00:00 9 [IU] via SUBCUTANEOUS

## 2020-09-08 MED ORDER — SODIUM CHLORIDE 0.9 % IV SOLN: INTRAVENOUS | Status: AC

## 2020-09-08 MED ORDER — INSULIN ASPART 100 UNIT/ML ~~LOC~~ SOLN
3.0000 [IU] | Freq: Three times a day (TID) | SUBCUTANEOUS | Status: DC
  Administered 2020-09-08 (×3): 3 [IU] via SUBCUTANEOUS

## 2020-09-08 NOTE — Progress Notes (Signed)
Christopher Welch   DOB:Jul 17, 1946   HY#:[id number]   EXB#:[id number]  Oncology follow up note   Subjective: Patient does not feel well today, quite fatigued.  He reported 3 black bowel movement today, no abdominal cramps, or nausea, he has been afebrile, blood culture did not show Klebsiella pneumonia and enterobacterales. He is on iv antibiotics.    Objective:  Vitals:   09/08/20 0454 09/08/20 1329  BP: 113/65 108/66  Pulse: (!) 107 (!) 104  Resp: 20 16  Temp: 98.4 F (36.9 C) 98.3 F (36.8 C)  SpO2: 96% 96%    Body mass index is 20.78 kg/m.  Intake/Output Summary (Last 24 hours) at 09/08/2020 1803 Last data filed at 09/08/2020 1304 Gross per 24 hour  Intake 50 ml  Output 350 ml  Net -300 ml     Sclerae unicteric  Oropharynx clear  No peripheral adenopathy  Lungs clear -- no rales or rhonchi  Heart regular rate and rhythm  Abdomen soft, mild tenderness in epigastric area  MSK no focal spinal tenderness, no peripheral edema  Neuro nonfocal    CBG (last 3)  Recent Labs    09/08/20 0759 09/08/20 1216 09/08/20 1555  GLUCAP 335* 380* 400*     Labs:  Urine Studies No results for input(s): UHGB, CRYS in the last 72 hours.  Invalid input(s): UACOL, UAPR, USPG, UPH, UTP, UGL, UKET, UBIL, UNIT, UROB, ULEU, UEPI, UWBC, URBC, UBAC, CAST, [place], [place]  Basic Metabolic Panel: Recent Labs  Lab 09/02/2020 1815 09/07/20 0455 09/08/20 1315  NA 130* 130* 130*  K 4.8 3.6 4.1  CL 96* 98 98  CO2 23 26 25   GLUCOSE 416* 345* 419*  BUN 37* 32* 38*  CREATININE 1.43* 1.30* 1.40*  CALCIUM 8.7* 8.3* 7.8*  MG 2.0 2.2  --   PHOS 3.1 2.4*  --    GFR Estimated Creatinine Clearance: 41.8 mL/min (A) (by C-G formula based on SCr of 1.4 mg/dL (H)). Liver Function Tests: Recent Labs  Lab 09/19/2020 1815 09/07/20 0455  AST 50* 35  ALT 50* 39  ALKPHOS 232* 184*  BILITOT 3.2* 1.9*  PROT 7.3 6.3*  ALBUMIN 3.1* 2.6*   No results for input(s): LIPASE, AMYLASE in the last 168 hours. No  results for input(s): AMMONIA in the last 168 hours. Coagulation profile Recent Labs  Lab 09/18/2020 1815  INR 1.6*    CBC: Recent Labs  Lab 09/17/2020 1815 09/07/20 0455 09/08/20 0949 09/08/20 1555  WBC 15.6* 10.4 11.5* 8.6  NEUTROABS 13.1* 8.4*  --   --   HGB 9.2* 8.3* 7.6* 7.5*  HCT 27.7* 25.4* 23.2* 23.1*  MCV 89.4 89.4 90.3 91.7  PLT 163 140* 149* 145*   Cardiac Enzymes: No results for input(s): CKTOTAL, CKMB, CKMBINDEX, TROPONINI in the last 168 hours. BNP: Invalid input(s): POCBNP CBG: Recent Labs  Lab 09/08/20 0005 09/08/20 0447 09/08/20 0759 09/08/20 1216 09/08/20 1555  GLUCAP 333* 324* 335* 380* 400*   D-Dimer No results for input(s): DDIMER in the last 72 hours. Hgb A1c Recent Labs    09/10/2020 1815  HGBA1C 6.9*   Lipid Profile No results for input(s): CHOL, HDL, LDLCALC, TRIG, CHOLHDL, LDLDIRECT in the last 72 hours. Thyroid function studies Recent Labs    09/07/20 0455  TSH 1.446   Anemia work up No results for input(s): VITAMINB12, FOLATE, FERRITIN, TIBC, IRON, RETICCTPCT in the last 72 hours. Microbiology Recent Results (from the past 240 hour(s))  Resp Panel by RT-PCR (Flu A&B, Covid) Nasopharyngeal Swab  Status: None   Collection Time: 09/12/2020  6:17 PM   Specimen: Nasopharyngeal Swab; Nasopharyngeal(NP) swabs in vial transport medium  Result Value Ref Range Status   SARS Coronavirus 2 by RT PCR NEGATIVE NEGATIVE Final    Comment: (NOTE) SARS-CoV-2 target nucleic acids are NOT DETECTED.  The SARS-CoV-2 RNA is generally detectable in upper respiratory specimens during the acute phase of infection. The lowest concentration of SARS-CoV-2 viral copies this assay can detect is 138 copies/mL. A negative result does not preclude SARS-Cov-2 infection and should not be used as the sole basis for treatment or other patient management decisions. A negative result may occur with  improper specimen collection/handling, submission of specimen  other than nasopharyngeal swab, presence of viral mutation(s) within the areas targeted by this assay, and inadequate number of viral copies(<138 copies/mL). A negative result must be combined with clinical observations, patient history, and epidemiological information. The expected result is Negative.  Fact Sheet for Patients:  EntrepreneurPulse.com.au  Fact Sheet for Healthcare Providers:  IncredibleEmployment.be  This test is no t yet approved or cleared by the [place] FDA and  has been authorized for detection and/or diagnosis of SARS-CoV-2 by FDA under an Emergency Use Authorization (EUA). This EUA will remain  in effect (meaning this test can be used) for the duration of the COVID-19 declaration under Section 564(b)(1) of the Act, 21 U.S.C.section 360bbb-3(b)(1), unless the authorization is terminated  or revoked sooner.       Influenza A by PCR NEGATIVE NEGATIVE Final   Influenza B by PCR NEGATIVE NEGATIVE Final    Comment: (NOTE) The Xpert Xpress SARS-CoV-2/FLU/RSV plus assay is intended as an aid in the diagnosis of influenza from Nasopharyngeal swab specimens and should not be used as a sole basis for treatment. Nasal washings and aspirates are unacceptable for Xpert Xpress SARS-CoV-2/FLU/RSV testing.  Fact Sheet for Patients: EntrepreneurPulse.com.au  Fact Sheet for Healthcare Providers: IncredibleEmployment.be  This test is not yet approved or cleared by the [place] FDA and has been authorized for detection and/or diagnosis of SARS-CoV-2 by FDA under an Emergency Use Authorization (EUA). This EUA will remain in effect (meaning this test can be used) for the duration of the COVID-19 declaration under Section 564(b)(1) of the Act, 21 U.S.C. section 360bbb-3(b)(1), unless the authorization is terminated or revoked.  Performed at [facility], [place] [address], [place], [place] [address]   Culture, blood (Routine x 2)     Status: Abnormal (Preliminary result)   Collection Time: 09/04/2020  6:19 PM   Specimen: BLOOD  Result Value Ref Range Status   Specimen Description   Final    BLOOD PORTA CATH Performed at [facility] [address], [place], [place] [address]    Special Requests   Final    BOTTLES DRAWN AEROBIC AND ANAEROBIC Blood Culture adequate volume Performed at [facility] [address], [place], [place] [address]    Culture  Setup Time   Final    GRAM NEGATIVE RODS AEROBIC BOTTLE ONLY Organism ID to follow CRITICAL RESULT CALLED TO, READ BACK BY AND VERIFIED WITH: Karie Kirks PharmD 15:00 09/07/20 ([email or web address])    Culture (A)  Final    KLEBSIELLA PNEUMONIAE SUSCEPTIBILITIES TO FOLLOW Performed at [facility], [place] [address], [place], [place] [address]    Report Status PENDING  Incomplete  Culture, blood (Routine x 2)     Status: None (Preliminary result)   Collection Time: 08/30/2020  6:19  PM   Specimen: BLOOD  Result Value Ref Range Status   Specimen Description   Final    BLOOD RIGHT ANTECUBITAL Performed at [facility] [address] [place], [place] [address]    Special Requests   Final    BOTTLES DRAWN AEROBIC AND ANAEROBIC Blood Culture adequate volume Performed at [facility] [address] [place], [place] [address]    Culture   Final    NO GROWTH 2 DAYS Performed at [facility] [address], [place], [place] [address]    Report Status PENDING  Incomplete  Blood Culture ID Panel (Reflexed)     Status: Abnormal   Collection Time: 09/11/2020  6:19 PM  Result Value Ref Range Status   Enterococcus faecalis NOT DETECTED NOT DETECTED Final   Enterococcus Faecium NOT DETECTED NOT DETECTED Final   Listeria monocytogenes NOT DETECTED NOT DETECTED Final   Staphylococcus species NOT DETECTED NOT DETECTED Final   Staphylococcus  aureus (BCID) NOT DETECTED NOT DETECTED Final   Staphylococcus epidermidis NOT DETECTED NOT DETECTED Final   Staphylococcus lugdunensis NOT DETECTED NOT DETECTED Final   Streptococcus species NOT DETECTED NOT DETECTED Final   Streptococcus agalactiae NOT DETECTED NOT DETECTED Final   Streptococcus pneumoniae NOT DETECTED NOT DETECTED Final   Streptococcus pyogenes NOT DETECTED NOT DETECTED Final   A.calcoaceticus-baumannii NOT DETECTED NOT DETECTED Final   Bacteroides fragilis NOT DETECTED NOT DETECTED Final   Enterobacterales DETECTED (A) NOT DETECTED Final    Comment: Enterobacterales represent a large order of gram negative bacteria, not a single organism. CRITICAL RESULT CALLED TO, READ BACK BY AND VERIFIED WITH: Karie Kirks PharmD 15:00 09/07/20 ([email or web address])    Enterobacter cloacae complex NOT DETECTED NOT DETECTED Final   Escherichia coli NOT DETECTED NOT DETECTED Final   Klebsiella aerogenes NOT DETECTED NOT DETECTED Final   Klebsiella oxytoca NOT DETECTED NOT DETECTED Final   Klebsiella pneumoniae DETECTED (A) NOT DETECTED Final    Comment: CRITICAL RESULT CALLED TO, READ BACK BY AND VERIFIED WITH: Karie Kirks PharmD 15:00 09/07/20 ([email or web address])    Proteus species NOT DETECTED NOT DETECTED Final   Salmonella species NOT DETECTED NOT DETECTED Final   Serratia marcescens NOT DETECTED NOT DETECTED Final   Haemophilus influenzae NOT DETECTED NOT DETECTED Final   Neisseria meningitidis NOT DETECTED NOT DETECTED Final   Pseudomonas aeruginosa NOT DETECTED NOT DETECTED Final   Stenotrophomonas maltophilia NOT DETECTED NOT DETECTED Final   Candida albicans NOT DETECTED NOT DETECTED Final   Candida auris NOT DETECTED NOT DETECTED Final   Candida glabrata NOT DETECTED NOT DETECTED Final   Candida krusei NOT DETECTED NOT DETECTED Final   Candida parapsilosis NOT DETECTED NOT DETECTED Final   Candida tropicalis NOT DETECTED NOT DETECTED Final   Cryptococcus neoformans/gattii NOT DETECTED NOT DETECTED  Final   CTX-M ESBL NOT DETECTED NOT DETECTED Final   Carbapenem resistance IMP NOT DETECTED NOT DETECTED Final   Carbapenem resistance KPC NOT DETECTED NOT DETECTED Final   Carbapenem resistance NDM NOT DETECTED NOT DETECTED Final   Carbapenem resist OXA 48 LIKE NOT DETECTED NOT DETECTED Final   Carbapenem resistance VIM NOT DETECTED NOT DETECTED Final    Comment: Performed at [facility], [address] [place], [place] [address]      Studies:  CT ABDOMEN PELVIS W CONTRAST  Result Date: 09/26/2020 CLINICAL DATA:  74 year old male with cholangiocarcinoma. Altered mental status. EXAM: CT ABDOMEN AND PELVIS WITH CONTRAST TECHNIQUE: Multidetector CT imaging  of the abdomen and pelvis was performed using the standard protocol following bolus administration of intravenous contrast. CONTRAST:  156mL OMNIPAQUE IOHEXOL 300 MG/ML  SOLN COMPARISON:  Restaging CT Chest, Abdomen, and Pelvis 04/12/2020, and earlier. FINDINGS: Lower chest: Chronic layering right pleural effusion with associated mild pleural thickening and enhancement is unchanged. Underlying simple fluid density. Adjacent right lower lung atelectasis is stable. However, there is also patchy new peribronchial opacity in the right middle lobe (series 4, image 26) and several small indistinct nodular opacities in the visible lingula (series 4, image 4), although there is superimposed respiratory motion. Hepatobiliary: Metal CBD stent remains in place. Pneumobilia has not significantly changed. But intrahepatic biliary ductal dilatation has progressed, especially since prior 02/03/2020 CT. No discrete liver lesion. Similar mild gallbladder distension. Pancreas: Increasing indistinct soft tissue medial to the distal CBD stent at the level of the pancreas on series 2, image 33. Associated increasing pancreatic ductal dilatation (series 2, image 34) with severe superimposed atrophy of pancreatic parenchyma which is partially calcified. Spleen:  Stable mild splenomegaly. Adrenals/Urinary Tract: Negative adrenal glands. Chronic asymmetric left renal volume loss. Bilateral renal enhancement and contrast excretion appears stable and within normal limits. Chronic benign right renal cysts. Unremarkable urinary bladder. Stomach/Bowel: No dilated large or small bowel. Retained stool throughout much of the colon. Trace fluid layering in both gutters. No discrete bowel inflammation identified. Mild to moderate gas and fluid distension of the stomach similar to the July CT. Abrupt tapering of the duodenum distal to the bulb appear stable. No free air. Vascular/Lymphatic: Calcified aortic atherosclerosis. Major arterial structures in the abdomen and pelvis are patent. The IVC appears to be patent. 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 SMV remains patent. No discrete or progressed abdominal or pelvic lymphadenopathy. Reproductive: Negative. Other: Small volume pelvic free fluid with simple fluid density is new since July (series 2, image 74). Chronic soft tissue thickening along the ventral abdominal wall likely from subcutaneous injections. Musculoskeletal: No acute or suspicious osseous lesion identified. IMPRESSION: 1. Sequelae of cholangiocarcinoma with evidence of progression about the metal CBD stent since the previous contrast enhanced CT Abdomen 02/23/2020. Associated dilatation of obstructed main pancreatic and intrahepatic bile ducts also appears progressed since May and July. 2. Chronic right pleural effusion and fibrothorax with patchy new right middle lobe and lingula opacity suspicious for developing bilateral respiratory infection. New pulmonary metastatic disease felt less likely. 3. New small volume ascites in the abdomen and pelvis. Electronically Signed   By: Genevie Ann M.D.   On: 09/05/2020 21:18   DG Chest Port 1 View  Result Date: 08/31/2020 CLINICAL DATA:  Altered mental status. EXAM: PORTABLE CHEST 1 VIEW COMPARISON:   October 29, 2019 FINDINGS: There is stable right-sided venous Port-A-Cath positioning. A trace amount of scarring and/or atelectasis is noted within the right lung base. There is a very small right pleural effusion. This is decreased in size when compared to the prior exam. No pneumothorax is identified. The heart size and mediastinal contours are within normal limits. Radiopaque surgical clips and a radiopaque tips stent is seen overlying the right upper quadrant. The visualized skeletal structures are unremarkable. IMPRESSION: 1. Trace amount of right basilar scarring and/or atelectasis. 2. Very small right pleural effusion, decreased in size when compared to the prior study. Electronically Signed   By: Virgina Norfolk M.D.   On: 09/18/2020 19:14    Assessment: 74 y.o. male  1. Sepsis secondary to Klebsiella pneumonia bacteremia 2. ? Upper GI  bleeding  3.  Hyponatremia 4.  worsening anemia 5.  Thrombocytopenia 6.  AKI, improving 7.  Type 2 diabetes mellitus 8.  CAD 9. DNR 10. Extrahepatic cholangiocarcinoma, off treatment now   Plan:  -he probably has some upper GI bleeding now, based on his melena and worsening anemia (some component of dilutional anemia), please consult GI Dr. Benson Norway. Pt is not very euthsiastic about endoscopy, given his underline cholangiocarcinoma and his palliative goal of care -please consider blood transfusion to keep Hg>8.0 -infection management per primary team -appreciate palliative care input also -will f/u next week    [name], MD 09/08/2020  6:03 PM

## 2020-09-08 NOTE — Consult Note (Signed)
Reason for Consult: Anemia and heme positive stool Referring Physician: [facility].  Christopher Welch HPI: This is a 74 year old male with a PMH of metastatic cholangiocarcinoma diagnosed on 01/21/2019, DM, and HTN admitted initially for a fever of 102.1 F.  He was diagnosed and treated for CAP and he is feeling better.  Earlier today he started to experience some GI bleeding.  He stated that he had two "black" stools and then it progressed to being maroon, which was confirmed by Dr. Teryl Lucy.  His HGB was checked and there is a drop in his values from a baseline of 8-9 g/dL to 7.5 g/dL.  He denies any issues with abdominal pain, nausea, or vomiting.  Recently he was in hospice, but he removed himself from hospice.  Dr. Burr Medico discussed possible palliative treatments, but he declines as he desires quality of life.  Past Medical History:  Diagnosis Date  . Diabetes mellitus type I, controlled ([place])   . Dyslipidemia   . ED (erectile dysfunction)   . Essential hypertension   . Gilbert's disease   . Hepatitis B carrier [facility])     Past Surgical History:  Procedure Laterality Date  . APPENDECTOMY  1964  . BILIARY BRUSHING  01/21/2019   Procedure: BILIARY BRUSHING;  Surgeon: Carol Ada, MD;  Location: [facility];  Service: Endoscopy;;  . BILIARY STENT PLACEMENT N/A 01/21/2019   Procedure: BILIARY STENT PLACEMENT;  Surgeon: Carol Ada, MD;  Location: [facility];  Service: Endoscopy;  Laterality: N/A;  . ENDOSCOPIC RETROGRADE CHOLANGIOPANCREATOGRAPHY (ERCP) WITH PROPOFOL N/A 01/21/2019   Procedure: ENDOSCOPIC RETROGRADE CHOLANGIOPANCREATOGRAPHY (ERCP) WITH PROPOFOL;  Surgeon: Carol Ada, MD;  Location: [facility];  Service: Endoscopy;  Laterality: N/A;  . ESOPHAGOGASTRODUODENOSCOPY (EGD) WITH PROPOFOL N/A 01/21/2019   Procedure: ESOPHAGOGASTRODUODENOSCOPY (EGD) WITH PROPOFOL;  Surgeon: Carol Ada, MD;  Location: [facility];  Service: Endoscopy;  Laterality: N/A;  . FEMORAL HERNIA  REPAIR    . IR CHOLANGIOGRAM EXISTING TUBE  05/13/2019  . IR CHOLANGIOGRAM EXISTING TUBE  06/08/2019  . IR EXCHANGE BILIARY DRAIN  05/18/2019  . IR EXCHANGE BILIARY DRAIN  06/08/2019  . IR IMAGING GUIDED PORT INSERTION  03/08/2019  . IR PERC CHOLECYSTOSTOMY  05/04/2019  . IR THORACENTESIS ASP PLEURAL SPACE W/IMG GUIDE  10/29/2019  . LEFT HEART CATH AND CORONARY ANGIOGRAPHY N/A 05/19/2017   Procedure: LEFT HEART CATH AND CORONARY ANGIOGRAPHY;  Surgeon: Leonie Man, MD;  Location: [facility];  Service: Cardiovascular;  Laterality: N/A;  . SPHINCTEROTOMY  01/21/2019   Procedure: SPHINCTEROTOMY;  Surgeon: Carol Ada, MD;  Location[facility];  Service: Endoscopy;;  . UPPER ESOPHAGEAL ENDOSCOPIC ULTRASOUND (EUS) N/A 01/21/2019   Procedure: UPPER ESOPHAGEAL ENDOSCOPIC ULTRASOUND (EUS);  Surgeon: Carol Ada, MD;  Location: [facility];  Service: Endoscopy;  Laterality: N/A;    Family History  Problem Relation Age of Onset  . Diabetes Father     Social History:  reports that he has never smoked. He has never used smokeless tobacco. He reports current alcohol use of about 7.0 standard drinks of alcohol per week. He reports that he does not use drugs.  Allergies:  Allergies  Allergen Reactions  . Erythromycin     Severe Abdominal pain    Medications:  Scheduled: . Chlorhexidine Gluconate Cloth  6 each Topical Daily  . insulin aspart  0-5 Units Subcutaneous QHS  . insulin aspart  0-9 Units Subcutaneous TID WC  . insulin aspart  3 Units Subcutaneous TID WC  . insulin  glargine  15 Units Subcutaneous QHS  . mirtazapine  15 mg Oral QHS  . morphine  30 mg Oral Q12H  . sodium chloride flush  10-40 mL Intracatheter Q12H   Continuous: . sodium chloride 75 mL/hr at 09/08/20 1115  . [START ON 2020-09-17] cefTRIAXone (ROCEPHIN)  IV    . doxycycline (VIBRAMYCIN) IV 100 mg (09/08/20 1115)    Results for orders placed or performed during the hospital encounter of 09/04/2020 (from the  past 24 hour(s))  Glucose, capillary     Status: Abnormal   Collection Time: 09/07/20  5:27 PM  Result Value Ref Range   Glucose-Capillary 276 (H) 70 - 99 mg/dL  Glucose, capillary     Status: Abnormal   Collection Time: 09/07/20  8:44 PM  Result Value Ref Range   Glucose-Capillary 264 (H) 70 - 99 mg/dL  Glucose, capillary     Status: Abnormal   Collection Time: 09/08/20 12:05 AM  Result Value Ref Range   Glucose-Capillary 333 (H) 70 - 99 mg/dL  Glucose, capillary     Status: Abnormal   Collection Time: 09/08/20  4:47 AM  Result Value Ref Range   Glucose-Capillary 324 (H) 70 - 99 mg/dL  Glucose, capillary     Status: Abnormal   Collection Time: 09/08/20  7:59 AM  Result Value Ref Range   Glucose-Capillary 335 (H) 70 - 99 mg/dL  CBC     Status: Abnormal   Collection Time: 09/08/20  9:49 AM  Result Value Ref Range   WBC 11.5 (H) 4.0 - 10.5 K/uL   RBC 2.57 (L) 4.22 - 5.81 MIL/uL   Hemoglobin 7.6 (L) 13.0 - 17.0 g/dL   HCT 23.2 (L) 39.0 - 52.0 %   MCV 90.3 80.0 - 100.0 fL   MCH 29.6 26.0 - 34.0 pg   MCHC 32.8 30.0 - 36.0 g/dL   RDW 17.0 (H) 11.5 - 15.5 %   Platelets 149 (L) 150 - 400 K/uL   nRBC 0.0 0.0 - 0.2 %  Glucose, capillary     Status: Abnormal   Collection Time: 09/08/20 12:16 PM  Result Value Ref Range   Glucose-Capillary 380 (H) 70 - 99 mg/dL  Basic metabolic panel     Status: Abnormal   Collection Time: 09/08/20  1:15 PM  Result Value Ref Range   Sodium 130 (L) 135 - 145 mmol/L   Potassium 4.1 3.5 - 5.1 mmol/L   Chloride 98 98 - 111 mmol/L   CO2 25 22 - 32 mmol/L   Glucose, Bld 419 (H) 70 - 99 mg/dL   BUN 38 (H) 8 - 23 mg/dL   Creatinine, Ser 1.40 (H) 0.61 - 1.24 mg/dL   Calcium 7.8 (L) 8.9 - 10.3 mg/dL   GFR, Estimated 53 (L) >60 mL/min   Anion gap 7 5 - 15  CBC     Status: Abnormal   Collection Time: 09/08/20  3:55 PM  Result Value Ref Range   WBC 8.6 4.0 - 10.5 K/uL   RBC 2.52 (L) 4.22 - 5.81 MIL/uL   Hemoglobin 7.5 (L) 13.0 - 17.0 g/dL   HCT 23.1  (L) 39.0 - 52.0 %   MCV 91.7 80.0 - 100.0 fL   MCH 29.8 26.0 - 34.0 pg   MCHC 32.5 30.0 - 36.0 g/dL   RDW 17.0 (H) 11.5 - 15.5 %   Platelets 145 (L) 150 - 400 K/uL   nRBC 0.0 0.0 - 0.2 %  Glucose, capillary     Status:  Abnormal   Collection Time: 09/08/20  3:55 PM  Result Value Ref Range   Glucose-Capillary 400 (H) 70 - 99 mg/dL     CT ABDOMEN PELVIS W CONTRAST  Result Date: 09/13/2020 CLINICAL DATA:  74 year old male with cholangiocarcinoma. Altered mental status. EXAM: CT ABDOMEN AND PELVIS WITH CONTRAST TECHNIQUE: Multidetector CT imaging of the abdomen and pelvis was performed using the standard protocol following bolus administration of intravenous contrast. CONTRAST:  140mL OMNIPAQUE IOHEXOL 300 MG/ML  SOLN COMPARISON:  Restaging CT Chest, Abdomen, and Pelvis 04/12/2020, and earlier. FINDINGS: Lower chest: Chronic layering right pleural effusion with associated mild pleural thickening and enhancement is unchanged. Underlying simple fluid density. Adjacent right lower lung atelectasis is stable. However, there is also patchy new peribronchial opacity in the right middle lobe (series 4, image 26) and several small indistinct nodular opacities in the visible lingula (series 4, image 4), although there is superimposed respiratory motion. Hepatobiliary: Metal CBD stent remains in place. Pneumobilia has not significantly changed. But intrahepatic biliary ductal dilatation has progressed, especially since prior 02/03/2020 CT. No discrete liver lesion. Similar mild gallbladder distension. Pancreas: Increasing indistinct soft tissue medial to the distal CBD stent at the level of the pancreas on series 2, image 33. Associated increasing pancreatic ductal dilatation (series 2, image 34) with severe superimposed atrophy of pancreatic parenchyma which is partially calcified. Spleen: Stable mild splenomegaly. Adrenals/Urinary Tract: Negative adrenal glands. Chronic asymmetric left renal volume loss. Bilateral  renal enhancement and contrast excretion appears stable and within normal limits. Chronic benign right renal cysts. Unremarkable urinary bladder. Stomach/Bowel: No dilated large or small bowel. Retained stool throughout much of the colon. Trace fluid layering in both gutters. No discrete bowel inflammation identified. Mild to moderate gas and fluid distension of the stomach similar to the July CT. Abrupt tapering of the duodenum distal to the bulb appear stable. No free air. Vascular/Lymphatic: Calcified aortic atherosclerosis. Major arterial structures in the abdomen and pelvis are patent. The IVC appears to be patent. 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 SMV remains patent. No discrete or progressed abdominal or pelvic lymphadenopathy. Reproductive: Negative. Other: Small volume pelvic free fluid with simple fluid density is new since July (series 2, image 74). Chronic soft tissue thickening along the ventral abdominal wall likely from subcutaneous injections. Musculoskeletal: No acute or suspicious osseous lesion identified. IMPRESSION: 1. Sequelae of cholangiocarcinoma with evidence of progression about the metal CBD stent since the previous contrast enhanced CT Abdomen 02/23/2020. Associated dilatation of obstructed main pancreatic and intrahepatic bile ducts also appears progressed since May and July. 2. Chronic right pleural effusion and fibrothorax with patchy new right middle lobe and lingula opacity suspicious for developing bilateral respiratory infection. New pulmonary metastatic disease felt less likely. 3. New small volume ascites in the abdomen and pelvis. Electronically Signed   By: Genevie Ann M.D.   On: 09/19/2020 21:18   DG Chest Port 1 View  Result Date: 09/02/2020 CLINICAL DATA:  Altered mental status. EXAM: PORTABLE CHEST 1 VIEW COMPARISON:  October 29, 2019 FINDINGS: There is stable right-sided venous Port-A-Cath positioning. A trace amount of scarring and/or  atelectasis is noted within the right lung base. There is a very small right pleural effusion. This is decreased in size when compared to the prior exam. No pneumothorax is identified. The heart size and mediastinal contours are within normal limits. Radiopaque surgical clips and a radiopaque tips stent is seen overlying the right upper quadrant. The visualized skeletal structures are unremarkable. IMPRESSION:  1. Trace amount of right basilar scarring and/or atelectasis. 2. Very small right pleural effusion, decreased in size when compared to the prior study. Electronically Signed   By: Virgina Norfolk M.D.   On: 09/22/2020 19:14    ROS:  As stated above in the HPI otherwise negative.  Blood pressure 108/66, pulse (!) 104, temperature 98.3 F (36.8 C), temperature source Oral, resp. rate 16, height 5\' 8"  (1.727 m), weight 62 kg, SpO2 96 %.    PE: Gen: NAD, Alert and Oriented HEENT:  /AT, EOMI Neck: Supple, no LAD Lungs: CTA Bilaterally CV: RRR without M/G/R ABD: Soft, NTND, +BS Ext: No C/C/E  Assessment/Plan: 1) GI bleed. 2) Anemia. 3) Metastatic cholangiocarcinoma.   The patient does exhibit a GI bleed, but the severity is in question.  It was melenic initially, but now there is more of a maroon quality.  He denies any further bleeding at this time.  His focus is on quality of life and the pros and cons of an endoscopic (EGD/colonoscopy) work was were discussed with him in light of his metastatic cholangiocarcinoma.  He is undecided and he consents to a blood transfusion.  Plan: 1) Agree with a blood transfusion. 2) Monitor HGB. 3) Hold on any endoscopic work up at this time. 4) Dr. Fuller Plan will round on the patient this weekend.  Milanie Rosenfield D 09/08/2020, 5:22 PM

## 2020-09-08 NOTE — Consult Note (Signed)
Consultation Note Date: 09/08/2020   Patient Name: Christopher Welch  DOB: 01/11/1946  MRN: [id number]  Age / Sex: 74 y.o., male  PCP: [name], DO Referring Physician: Desiree Hane, MD  Reason for Consultation: Establishing goals of care  HPI/Patient Profile: 74 y.o. male     admitted on 09/15/2020   Christopher Welch is a 74 y.o. year old male with medical history significant for diabetes, CKD stage III, metastatic cholangiocarcinoma with chronic pain who presented on 09/01/2020 with days of nausea, vomiting, diminished Po intake, cough productive of sputum and fevers at home and was found to have secondary to community-acquired pneumonia.  Clinical Assessment and Goals of Care: Palliative consultation has been requested for ongoing goals of care discussions.  Patient mains admitted to hospital medicine service.  Medical oncology is also following.  I met with the patient he was resting in bed.  Introduced myself and palliative care as follows: Palliative medicine is specialized medical care for people living with serious illness. It focuses on providing relief from the symptoms and stress of a serious illness. The goal is to improve quality of life for both the patient and the family.  Goals of care: Broad aims of medical therapy in relation to the patient's values and preferences. Our aim is to provide medical care aimed at enabling patients to achieve the goals that matter most to them, given the circumstances of their particular medical situation and their constraints.   Patient states that at times he feels confused, at times he has nausea.  He states that Zofran does not work on him.  He asks about why he feels so weak.  Discussed with him about reason for current hospitalization sepsis secondary to community-acquired pneumonia and underlying serious illness of metastatic cholangiocarcinoma.  Patient does  not wish to discuss further about things from a hospice or palliative perspective.  He simply stated that he has contact information available from hospice services from last time and he will reengage with them when he feels it is appropriate.  NEXT OF KIN Coralyn Helling  SUMMARY OF RECOMMENDATIONS   Agree with DNR Continue current pain and on pain symptom management Patient wishes to go home towards the end of this hospitalization.  Recommend home health care on discharge.  Patient states that he has contact information about hospice services at home and will think about reengaging with hospice services at a later time.  Does not wish to discuss with me about this at the current moment.  Code Status/Advance Care Planning:  DNR    Symptom Management:   Medication history noted, continue current pain and on pain symptom management.  Palliative Prophylaxis:   Delirium Protocol   Psycho-social/Spiritual:   Desire for further Chaplaincy support:yes  Additional Recommendations: Caregiving  Support/Resources  Prognosis:   < 6 months  Discharge Planning: Home with Home Health      Primary Diagnoses: Present on Admission: . CAP (community acquired pneumonia) . Hyponatremia . Hyperglycemia . Essential hypertension . Cholangiocarcinoma ([place]) .  Anemia . AKI (acute kidney injury) ([place]) . Elevated LFTs . Sepsis ([place]) . Sepsis due to pneumonia [facility])   I have reviewed the medical record, interviewed the patient and family, and examined the patient. The following aspects are pertinent.  Past Medical History:  Diagnosis Date  . Diabetes mellitus type I, controlled ([place])   . Dyslipidemia   . ED (erectile dysfunction)   . Essential hypertension   . Gilbert's disease   . Hepatitis B carrier [facility])    Social History   Socioeconomic History  . Marital status: Married    Spouse name: Not on file  . Number of children: Not on file  . Years of education: Not on file  .  Highest education level: Not on file  Occupational History  . Not on file  Tobacco Use  . Smoking status: Never Smoker  . Smokeless tobacco: Never Used  Vaping Use  . Vaping Use: Never used  Substance and Sexual Activity  . Alcohol use: Yes    Alcohol/week: 7.0 standard drinks    Types: 7 Glasses of wine per week  . Drug use: No  . Sexual activity: Yes  Other Topics Concern  . Not on file  Social History Narrative   Lives alone in a one story home - "partner" No children.    .  Works part time at [employer].   Education: masters in education   Quit smoking in 1984. Did have passive smoke exposure prior to that with his father.   Drains 5 glasses of wine per week. 2 cups of coffee daily.   Works out at [employer] regularly - 5 days per week.   Social Determinants of Health   Financial Resource Strain: Not on file  Food Insecurity: Not on file  Transportation Needs: Not on file  Physical Activity: Not on file  Stress: Not on file  Social Connections: Not on file   Family History  Problem Relation Age of Onset  . Diabetes Father    Scheduled Meds: . Chlorhexidine Gluconate Cloth  6 each Topical Daily  . insulin aspart  0-5 Units Subcutaneous QHS  . insulin aspart  0-9 Units Subcutaneous TID WC  . insulin aspart  3 Units Subcutaneous TID WC  . insulin glargine  15 Units Subcutaneous QHS  . mirtazapine  15 mg Oral QHS  . morphine  30 mg Oral Q12H  . sodium chloride flush  10-40 mL Intracatheter Q12H   Continuous Infusions: . sodium chloride 75 mL/hr at 09/08/20 1115  . [START ON 09/21/2020] cefTRIAXone (ROCEPHIN)  IV    . doxycycline (VIBRAMYCIN) IV 100 mg (09/08/20 1115)   PRN Meds:.acetaminophen **OR** acetaminophen, albuterol, oxyCODONE, prochlorperazine, sodium chloride flush Medications Prior to Admission:  Prior to Admission medications   Medication Sig Start Date End Date Taking? Authorizing Provider  amLODipine (NORVASC) 5 MG tablet Take 5 mg by mouth daily.     Yes [provider]  atenolol (TENORMIN) 25 MG tablet Take 25 mg by mouth daily. Once a day    Yes [provider]  insulin NPH Human (NOVOLIN N) 100 UNIT/ML injection Inject 20 Units into the skin 2 (two) times daily before a meal.    Yes [provider]  insulin regular (NOVOLIN R) 100 units/mL injection Inject 20 Units into the skin 2 (two) times daily before a meal.    Yes [provider]  mirtazapine (REMERON) 15 MG tablet Take 1 tablet (15 mg total) by mouth at bedtime.  08/22/20  Yes Alla Feeling, NP  morphine (MS CONTIN) 30 MG 12 hr tablet Take 1 tablet (30 mg total) by mouth every 12 (twelve) hours. 08/30/20  Yes [name], MD  oxycodone (OXY-IR) 5 MG capsule Take 1-2 capsules (5-10 mg total) by mouth every 6 (six) hours as needed for pain (severe pain). Patient taking differently: Take 5-10 mg by mouth every 6 (six) hours as needed for pain.  07/26/20  Yes [name], MD  Blood Glucose Monitoring Suppl (ACCU-CHEK NANO SMARTVIEW) W/DEVICE KIT 1 kit by Does not apply route 2 (two) times daily. Patient taking differently: 1 kit by Does not apply route See admin instructions. Test blood sugars 12x's daily 10/23/12   Denita Lung, MD  glucose blood test strip Test 3 times a day. Patient taking differently: 1 each by Other route See admin instructions. Test 12 times a day. 10/26/12   Denita Lung, MD  ondansetron (ZOFRAN) 8 MG tablet TAKE 1 TABLET BY MOUTH 2 TIMES DAY AS NEEDED. START ON 3RD DAY AFTER CHEMOTHERAPY Patient not taking: Reported on 08/30/2020 08/19/19   [name], MD  linaclotide [facility]) 145 MCG CAPS capsule Take 1 capsule (145 mcg total) by mouth daily before breakfast. Patient not taking: Reported on 05/09/2020 04/28/20 08/11/20  [name], MD  pantoprazole (PROTONIX) 40 MG tablet TAKE 1 TABLET BY MOUTH EVERY DAY Patient not taking: Reported on 08/11/2020 05/16/20 08/11/20  [name], MD  prochlorperazine (COMPAZINE) 10 MG tablet TAKE 1  TABLET (10 MG TOTAL) BY MOUTH EVERY 6 (SIX) HOURS AS NEEDED (NAUSEA OR VOMITING). Patient not taking: Reported on 08/11/2020 08/19/19 08/11/20  [name], MD   Allergies  Allergen Reactions  . Erythromycin     Severe Abdominal pain   Review of Systems +nausea  Physical Exam Patient appears weak resting in bed Has some nausea, appears to be resting but is able to awaken and answer questions appropriately Regular work of breathing Abdomen is soft nondistended nontender Does not have edema Does have some muscle wasting Appears chronically ill  Vital Signs: BP 113/65 (BP Location: Left Arm)   Pulse (!) 107   Temp 98.4 F (36.9 C) (Oral)   Resp 20   Ht 5' 8"  (1.727 m)   Wt 62 kg   SpO2 96%   BMI 20.78 kg/m  Pain Scale: 0-10   Pain Score: Asleep   SpO2: SpO2: 96 % O2 Device:SpO2: 96 % O2 Flow Rate: .   IO: Intake/output summary:   Intake/Output Summary (Last 24 hours) at 09/08/2020 1204 Last data filed at 09/08/2020 0449 Gross per 24 hour  Intake --  Output 350 ml  Net -350 ml    LBM:   Baseline Weight: Weight: 62 kg Most recent weight: Weight: 62 kg     Palliative Assessment/Data:   PPS 40%  Time In:  11 Time Out:  12 Time Total:  60  Greater than 50%  of this time was spent counseling and coordinating care related to the above assessment and plan.  Signed by: Loistine Chance, MD   Please contact Palliative Medicine Team phone at [phone] for questions and concerns.  For individual provider: See Shea Evans

## 2020-09-08 NOTE — Progress Notes (Signed)
Inpatient Diabetes Program Recommendations  AACE/ADA: New Consensus Statement on Inpatient Glycemic Control (2015)  Target Ranges:  Prepandial:   less than 140 mg/dL      Peak postprandial:   less than 180 mg/dL (1-2 hours)      Critically ill patients:  140 - 180 mg/dL   Lab Results  Component Value Date   GLUCAP 380 (H) 09/08/2020   HGBA1C 6.9 (H) 09/08/2020    Review of Glycemic Control  Diabetes history: DM2 Outpatient Diabetes medications: Novolin N 20 R20 bid Current orders for Inpatient glycemic control: Lantus 15 units QHS, Novolog 0-9 units tidwc and hs + 3 units tidwc  HgbA1C - 6.9%. Wears Dexcom CGM 335, 380, 419 mg/dL  Inpatient Diabetes Program Recommendations:    Increase Lantus to 20 units QHS Increase Novolog to 5 units tidwc if pt eats > 50% meal  Spoke with pt in ED yesterday evening. Pt states he keeps blood sugars in "pretty good control" especially since he started wearing the Dexcom CGM. Discussed excellent HgbA1C of 6.9%. Asked if he ever has any lows at home and he said no. Had no questions for me as he was getting ready for transfer to 6th floor.   Thank you. Lorenda Peck, RD, LDN, CDE Inpatient Diabetes Coordinator [phone]

## 2020-09-08 NOTE — Progress Notes (Addendum)
[facility]  PROGRESS NOTE  Christopher Welch VEH:[id number] DOB: 09-06-1946 DOA: 09/07/2020 PCP: [name], DO Admit date - 09/15/2020   Admitting Physician Desiree Hane, MD  Outpatient Primary MD for the patient is [name], DO  LOS - 1 Brief Narrative   Christopher Welch is a 74 y.o. year old male with medical history significant for diabetes, CKD stage III, metastatic cholangiocarcinoma with chronic pain who presented on 09/11/2020 with days of nausea, vomiting, diminished Po intake, cough productive of sputum and fevers at home and was found to have secondary to community-acquired pneumonia.    Subjective  Sleepy after standing up earlier today, states that he wakes up feeling disoriented but knows that he is in the hospital currently.  Denies any abdominal pain.  States his appetite is minimal.  Still having nausea.  A & P  Sepsis secondary to community acquired pneumonia with Klebsiella pneumonia bacteremia, right middle lobe based on CT imaging.   patient presented with fever of 102.1 and WBC 15.6,.  Blood culture positive for Klebsiella pneumonia, remains afebrile, white count had down trended but now slightly up at 11.6 -Continue ceftriaxone, doxycycline while awaiting blood culture sensitivities -Strep pneumo negative, pending Legionella, Covid PCR negative -Sputum culture   Metastatic extrahepatic cholangiocarcinoma previously on hospice but discharged November 2021 -Not currently receiving any treatment, focusing on quality of life -Appreciate oncology assistance during hospitalization  Hyponatremia, mild.    Likely related to diminished p.o. intake related to generalized malaise with nausea/vomiting, currently stable.  Patient has diminished appetite likely in the setting of chronic illness further exacerbated by infection -Start maintenance fluid study has cc per hour x12 hours and reassess-monitor BMP  CKD stage III, stable.  Baseline creatinine  1.1-1.4. -Avoid nephrotoxins-monitor BMP and output  Type 2 diabetes, having hypoglycemia in hospital.  Likely related to acute infection -Increase Lantus to 13 units -Continue sliding scale insulin, monitor CBG  Chronic pain in setting of chronic illness, cancer-related pain -Continue home regimen of morphine MS Contin 30 mg twice daily, oxycodone IR 5 to 10 mg every 6 hours as needed moderate pain  Acute on chronic normocytic anemia, stable at baseline.  Likely related to anemia of chronic disease in the setting of cancer -Monitor CBC, no episodes of bleeding ADDENDUM Baseline hgb 8-9. 7.6 today with reports of melena by patient. On DRE maroon colored smear without melena. Given drop in hgb and changes in stool will consult GI (last listed colonoscopy 2008, ERCP in April 2020 by Dr. Benson Norway). Denies any recent NSAID use. Drop could be part dilutional given IVF for sepsis and infection on admission. Has gi history because of cholangiocarcinoma as well -repeat CBC now and trend q6 hrs -make NPO at midnight -maintain 2 IV lines -transfuse if hgb , 7, patient consented -given no melena will hold off on protonix  Thrombocytopenia, stable at baseline -monitor CBC, no bleeding episodes     Family Communication  : None  Code Status : DNR, as discussed on day of admission  Disposition Plan  :  Patient is from home. Anticipated d/c date: 2 to 3 days. Barriers to d/c or necessity for inpatient status:  IV antibiotics and monitoring cultures, blood pressure and sepsis physiology Consults  : Oncology  Procedures  : None  DVT Prophylaxis  : SCDs  MDM: The below labs and imaging reports were reviewed and summarized above.  Medication management as above.  Lab Results  Component Value Date   PLT 140 (  L) 09/07/2020    Diet :  Diet Order            Diet Carb Modified Fluid consistency: Thin; Room service appropriate? Yes  Diet effective now                  Inpatient  Medications Scheduled Meds: . Chlorhexidine Gluconate Cloth  6 each Topical Daily  . insulin aspart  0-5 Units Subcutaneous QHS  . insulin aspart  0-9 Units Subcutaneous TID WC  . insulin aspart  3 Units Subcutaneous TID WC  . insulin glargine  15 Units Subcutaneous QHS  . mirtazapine  15 mg Oral QHS  . morphine  30 mg Oral Q12H  . sodium chloride flush  10-40 mL Intracatheter Q12H   Continuous Infusions: . [START ON 09/19/20] cefTRIAXone (ROCEPHIN)  IV    . doxycycline (VIBRAMYCIN) IV 100 mg (09/07/20 2239)   PRN Meds:.acetaminophen **OR** acetaminophen, albuterol, oxyCODONE, prochlorperazine, sodium chloride flush  Antibiotics  :   Anti-infectives (From admission, onward)   Start     Dose/Rate Route Frequency Ordered Stop   September 19, 2020 1000  cefTRIAXone (ROCEPHIN) 2 g in sodium chloride 0.9 % 100 mL IVPB        2 g 200 mL/hr over 30 Minutes Intravenous Every 24 hours 09/08/20 0915     09/07/20 1000  doxycycline (VIBRAMYCIN) 100 mg in sodium chloride 0.9 % 250 mL IVPB        100 mg 125 mL/hr over 120 Minutes Intravenous Every 12 hours 09/17/2020 2247     09/07/20 0800  cefTRIAXone (ROCEPHIN) 2 g in sodium chloride 0.9 % 100 mL IVPB  Status:  Discontinued        2 g 200 mL/hr over 30 Minutes Intravenous Every 24 hours 09/15/2020 2248 09/08/20 0915   09/07/20 0000  cefTRIAXone (ROCEPHIN) 2 g in sodium chloride 0.9 % 100 mL IVPB  Status:  Discontinued        2 g 200 mL/hr over 30 Minutes Intravenous Every 24 hours 09/28/2020 2247 09/19/2020 2248   09/23/2020 2145  vancomycin (VANCOCIN) IVPB 1000 mg/200 mL premix        1,000 mg 200 mL/hr over 60 Minutes Intravenous  Once 09/21/2020 2144 09/01/2020 2310   09/11/2020 2145  piperacillin-tazobactam (ZOSYN) IVPB 3.375 g        3.375 g 12.5 mL/hr over 240 Minutes Intravenous  Once 09/20/2020 2144 09/07/20 0232       Objective   Vitals:   09/07/20 1600 09/07/20 1708 09/07/20 2041 09/08/20 0454  BP: 120/68 125/60 124/69 113/65  Pulse: 77 89 95 (!)  107  Resp: 13 16 20 20   Temp:  97.7 F (36.5 C) 99.4 F (37.4 C) 98.4 F (36.9 C)  TempSrc:  Oral Oral Oral  SpO2: 100% 98% 98% 96%  Weight:      Height:        SpO2: 96 %  Wt Readings from Last 3 Encounters:  09/20/2020 62 kg  08/30/20 62.8 kg  08/11/20 63.5 kg     Intake/Output Summary (Last 24 hours) at 09/08/2020 0958 Last data filed at 09/08/2020 0449 Gross per 24 hour  Intake --  Output 350 ml  Net -350 ml    Physical Exam:  Thin frail chronically ill-appearing male, no distress Sleepy but easily arousable, oriented to place, time, self No new F.N deficits,  [place].AT, Normal respiratory effort on room air, diminished breath sounds at bases RRR,No Gallops,Rubs or new Murmurs,  +ve B.Sounds, Abd  Soft, No tenderness, No rebound, guarding or rigidity.    I have personally reviewed the following:   Data Reviewed:  CBC Recent Labs  Lab 09/02/2020 1815 09/07/20 0455  WBC 15.6* 10.4  HGB 9.2* 8.3*  HCT 27.7* 25.4*  PLT 163 140*  MCV 89.4 89.4  MCH 29.7 29.2  MCHC 33.2 32.7  RDW 16.7* 16.8*  LYMPHSABS 0.8 1.1  MONOABS 1.7* 0.7  EOSABS 0.0 0.0  BASOSABS 0.0 0.0    Chemistries  Recent Labs  Lab 09/04/2020 1815 09/07/20 0455  NA 130* 130*  K 4.8 3.6  CL 96* 98  CO2 23 26  GLUCOSE 416* 345*  BUN 37* 32*  CREATININE 1.43* 1.30*  CALCIUM 8.7* 8.3*  MG 2.0 2.2  AST 50* 35  ALT 50* 39  ALKPHOS 232* 184*  BILITOT 3.2* 1.9*   ------------------------------------------------------------------------------------------------------------------ No results for input(s): CHOL, HDL, LDLCALC, TRIG, CHOLHDL, LDLDIRECT in the last 72 hours.  Lab Results  Component Value Date   HGBA1C 6.9 (H) 09/29/2020   ------------------------------------------------------------------------------------------------------------------ Recent Labs    09/07/20 0455  TSH 1.446    ------------------------------------------------------------------------------------------------------------------ No results for input(s): VITAMINB12, FOLATE, FERRITIN, TIBC, IRON, RETICCTPCT in the last 72 hours.  Coagulation profile Recent Labs  Lab 09/27/2020 1815  INR 1.6*    No results for input(s): DDIMER in the last 72 hours.  Cardiac Enzymes No results for input(s): CKMB, TROPONINI, MYOGLOBIN in the last 168 hours.  Invalid input(s): CK ------------------------------------------------------------------------------------------------------------------ No results found for: BNP  Micro Results Recent Results (from the past 240 hour(s))  Resp Panel by RT-PCR (Flu A&B, Covid) Nasopharyngeal Swab     Status: None   Collection Time: 09/08/2020  6:17 PM   Specimen: Nasopharyngeal Swab; Nasopharyngeal(NP) swabs in vial transport medium  Result Value Ref Range Status   SARS Coronavirus 2 by RT PCR NEGATIVE NEGATIVE Final    Comment: (NOTE) SARS-CoV-2 target nucleic acids are NOT DETECTED.  The SARS-CoV-2 RNA is generally detectable in upper respiratory specimens during the acute phase of infection. The lowest concentration of SARS-CoV-2 viral copies this assay can detect is 138 copies/mL. A negative result does not preclude SARS-Cov-2 infection and should not be used as the sole basis for treatment or other patient management decisions. A negative result may occur with  improper specimen collection/handling, submission of specimen other than nasopharyngeal swab, presence of viral mutation(s) within the areas targeted by this assay, and inadequate number of viral copies(<138 copies/mL). A negative result must be combined with clinical observations, patient history, and epidemiological information. The expected result is Negative.  Fact Sheet for Patients:  EntrepreneurPulse.com.au  Fact Sheet for Healthcare Providers:   IncredibleEmployment.be  This test is no t yet approved or cleared by the [place] FDA and  has been authorized for detection and/or diagnosis of SARS-CoV-2 by FDA under an Emergency Use Authorization (EUA). This EUA will remain  in effect (meaning this test can be used) for the duration of the COVID-19 declaration under Section 564(b)(1) of the Act, 21 U.S.C.section 360bbb-3(b)(1), unless the authorization is terminated  or revoked sooner.       Influenza A by PCR NEGATIVE NEGATIVE Final   Influenza B by PCR NEGATIVE NEGATIVE Final    Comment: (NOTE) The Xpert Xpress SARS-CoV-2/FLU/RSV plus assay is intended as an aid in the diagnosis of influenza from Nasopharyngeal swab specimens and should not be used as a sole basis for treatment. Nasal washings and aspirates are unacceptable for Xpert Xpress SARS-CoV-2/FLU/RSV testing.  Fact Sheet  for Patients: EntrepreneurPulse.com.au  Fact Sheet for Healthcare Providers: IncredibleEmployment.be  This test is not yet approved or cleared by the [place] FDA and has been authorized for detection and/or diagnosis of SARS-CoV-2 by FDA under an Emergency Use Authorization (EUA). This EUA will remain in effect (meaning this test can be used) for the duration of the COVID-19 declaration under Section 564(b)(1) of the Act, 21 U.S.C. section 360bbb-3(b)(1), unless the authorization is terminated or revoked.  Performed at [facility], [place] [address], [place], [place] [address]   Culture, blood (Routine x 2)     Status: Abnormal (Preliminary result)   Collection Time: 09/11/2020  6:19 PM   Specimen: BLOOD  Result Value Ref Range Status   Specimen Description   Final    BLOOD PORTA CATH Performed at [facility] [address], [place], [place] [address]    Special Requests   Final    BOTTLES DRAWN AEROBIC AND ANAEROBIC Blood Culture  adequate volume Performed at [facility] [address] [place], [place] [address]    Culture  Setup Time   Final    GRAM NEGATIVE RODS AEROBIC BOTTLE ONLY Organism ID to follow CRITICAL RESULT CALLED TO, READ BACK BY AND VERIFIED WITH: Karie Kirks PharmD 15:00 09/07/20 ([email or web address])    Culture (A)  Final    KLEBSIELLA PNEUMONIAE SUSCEPTIBILITIES TO FOLLOW Performed at [facility], [place] [address], [place], [place] [address]    Report Status PENDING  Incomplete  Culture, blood (Routine x 2)     Status: None (Preliminary result)   Collection Time: 09/07/2020  6:19 PM   Specimen: BLOOD  Result Value Ref Range Status   Specimen Description   Final    BLOOD RIGHT ANTECUBITAL Performed at [facility] [address], [place], [place] [address]    Special Requests   Final    BOTTLES DRAWN AEROBIC AND ANAEROBIC Blood Culture adequate volume Performed at [facility] [address] [place], [place] [address]    Culture   Final    NO GROWTH 2 DAYS Performed at [facility] [address], [place], [place] [address]    Report Status PENDING  Incomplete  Blood Culture ID Panel (Reflexed)     Status: Abnormal   Collection Time: 09/02/2020  6:19 PM  Result Value Ref Range Status   Enterococcus faecalis NOT DETECTED NOT DETECTED Final   Enterococcus Faecium NOT DETECTED NOT DETECTED Final   Listeria monocytogenes NOT DETECTED NOT DETECTED Final   Staphylococcus species NOT DETECTED NOT DETECTED Final   Staphylococcus aureus (BCID) NOT DETECTED NOT DETECTED Final   Staphylococcus epidermidis NOT DETECTED NOT DETECTED Final   Staphylococcus lugdunensis NOT DETECTED NOT DETECTED Final   Streptococcus species NOT DETECTED NOT DETECTED Final   Streptococcus agalactiae NOT DETECTED NOT DETECTED Final   Streptococcus pneumoniae NOT DETECTED NOT DETECTED Final   Streptococcus pyogenes NOT DETECTED NOT DETECTED Final    A.calcoaceticus-baumannii NOT DETECTED NOT DETECTED Final   Bacteroides fragilis NOT DETECTED NOT DETECTED Final   Enterobacterales DETECTED (A) NOT DETECTED Final    Comment: Enterobacterales represent a large order of gram negative bacteria, not a single organism. CRITICAL RESULT CALLED TO, READ BACK BY AND VERIFIED WITH: Karie Kirks PharmD 15:00 09/07/20 ([email or web address])    Enterobacter cloacae complex NOT DETECTED NOT DETECTED Final   Escherichia coli NOT DETECTED NOT DETECTED Final   Klebsiella aerogenes NOT DETECTED NOT DETECTED Final  Klebsiella oxytoca NOT DETECTED NOT DETECTED Final   Klebsiella pneumoniae DETECTED (A) NOT DETECTED Final    Comment: CRITICAL RESULT CALLED TO, READ BACK BY AND VERIFIED WITH: Karie Kirks PharmD 15:00 09/07/20 ([email or web address])    Proteus species NOT DETECTED NOT DETECTED Final   Salmonella species NOT DETECTED NOT DETECTED Final   Serratia marcescens NOT DETECTED NOT DETECTED Final   Haemophilus influenzae NOT DETECTED NOT DETECTED Final   Neisseria meningitidis NOT DETECTED NOT DETECTED Final   Pseudomonas aeruginosa NOT DETECTED NOT DETECTED Final   Stenotrophomonas maltophilia NOT DETECTED NOT DETECTED Final   Candida albicans NOT DETECTED NOT DETECTED Final   Candida auris NOT DETECTED NOT DETECTED Final   Candida glabrata NOT DETECTED NOT DETECTED Final   Candida krusei NOT DETECTED NOT DETECTED Final   Candida parapsilosis NOT DETECTED NOT DETECTED Final   Candida tropicalis NOT DETECTED NOT DETECTED Final   Cryptococcus neoformans/gattii NOT DETECTED NOT DETECTED Final   CTX-M ESBL NOT DETECTED NOT DETECTED Final   Carbapenem resistance IMP NOT DETECTED NOT DETECTED Final   Carbapenem resistance KPC NOT DETECTED NOT DETECTED Final   Carbapenem resistance NDM NOT DETECTED NOT DETECTED Final   Carbapenem resist OXA 48 LIKE NOT DETECTED NOT DETECTED Final   Carbapenem resistance VIM NOT DETECTED NOT DETECTED Final    Comment: Performed at [facility], [address], [place],  [address]    Radiology Reports CT ABDOMEN PELVIS W CONTRAST  Result Date: 09/18/2020 CLINICAL DATA:  74 year old male with cholangiocarcinoma. Altered mental status. EXAM: CT ABDOMEN AND PELVIS WITH CONTRAST TECHNIQUE: Multidetector CT imaging of the abdomen and pelvis was performed using the standard protocol following bolus administration of intravenous contrast. CONTRAST:  168mL OMNIPAQUE IOHEXOL 300 MG/ML  SOLN COMPARISON:  Restaging CT Chest, Abdomen, and Pelvis 04/12/2020, and earlier. FINDINGS: Lower chest: Chronic layering right pleural effusion with associated mild pleural thickening and enhancement is unchanged. Underlying simple fluid density. Adjacent right lower lung atelectasis is stable. However, there is also patchy new peribronchial opacity in the right middle lobe (series 4, image 26) and several small indistinct nodular opacities in the visible lingula (series 4, image 4), although there is superimposed respiratory motion. Hepatobiliary: Metal CBD stent remains in place. Pneumobilia has not significantly changed. But intrahepatic biliary ductal dilatation has progressed, especially since prior 02/03/2020 CT. No discrete liver lesion. Similar mild gallbladder distension. Pancreas: Increasing indistinct soft tissue medial to the distal CBD stent at the level of the pancreas on series 2, image 33. Associated increasing pancreatic ductal dilatation (series 2, image 34) with severe superimposed atrophy of pancreatic parenchyma which is partially calcified. Spleen: Stable mild splenomegaly. Adrenals/Urinary Tract: Negative adrenal glands. Chronic asymmetric left renal volume loss. Bilateral renal enhancement and contrast excretion appears stable and within normal limits. Chronic benign right renal cysts. Unremarkable urinary bladder. Stomach/Bowel: No dilated large or small bowel. Retained stool throughout much of the colon. Trace fluid layering in both gutters. No  discrete bowel inflammation identified. Mild to moderate gas and fluid distension of the stomach similar to the July CT. Abrupt tapering of the duodenum distal to the bulb appear stable. No free air. Vascular/Lymphatic: Calcified aortic atherosclerosis. Major arterial structures in the abdomen and pelvis are patent. The IVC appears to be patent. 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 SMV remains patent. No discrete or progressed abdominal or pelvic lymphadenopathy. Reproductive: Negative. Other: Small volume pelvic free fluid with simple fluid density is new since July (series 2,  image 74). Chronic soft tissue thickening along the ventral abdominal wall likely from subcutaneous injections. Musculoskeletal: No acute or suspicious osseous lesion identified. IMPRESSION: 1. Sequelae of cholangiocarcinoma with evidence of progression about the metal CBD stent since the previous contrast enhanced CT Abdomen 02/23/2020. Associated dilatation of obstructed main pancreatic and intrahepatic bile ducts also appears progressed since May and July. 2. Chronic right pleural effusion and fibrothorax with patchy new right middle lobe and lingula opacity suspicious for developing bilateral respiratory infection. New pulmonary metastatic disease felt less likely. 3. New small volume ascites in the abdomen and pelvis. Electronically Signed   By: Genevie Ann M.D.   On: 09/01/2020 21:18   DG Chest Port 1 View  Result Date: 09/04/2020 CLINICAL DATA:  Altered mental status. EXAM: PORTABLE CHEST 1 VIEW COMPARISON:  October 29, 2019 FINDINGS: There is stable right-sided venous Port-A-Cath positioning. A trace amount of scarring and/or atelectasis is noted within the right lung base. There is a very small right pleural effusion. This is decreased in size when compared to the prior exam. No pneumothorax is identified. The heart size and mediastinal contours are within normal limits. Radiopaque surgical clips and a  radiopaque tips stent is seen overlying the right upper quadrant. The visualized skeletal structures are unremarkable. IMPRESSION: 1. Trace amount of right basilar scarring and/or atelectasis. 2. Very small right pleural effusion, decreased in size when compared to the prior study. Electronically Signed   By: Virgina Norfolk M.D.   On: 09/14/2020 19:14     Time Spent in minutes  30     Desiree Hane M.D on 09/08/2020 at 9:58 AM  To page go to [email or web address] - password [facility]

## 2020-09-09 ENCOUNTER — Inpatient Hospital Stay (HOSPITAL_COMMUNITY): Payer: Medicare Other

## 2020-09-09 LAB — BASIC METABOLIC PANEL
Anion gap: 5 (ref 5–15)
Anion gap: 14 (ref 5–15)
BUN: 46 mg/dL — ABNORMAL HIGH (ref 8–23)
BUN: 54 mg/dL — ABNORMAL HIGH (ref 8–23)
CO2: 24 mmol/L (ref 22–32)
CO2: 16 mmol/L — ABNORMAL LOW (ref 22–32)
Calcium: 7.4 mg/dL — ABNORMAL LOW (ref 8.9–10.3)
Calcium: 7.5 mg/dL — ABNORMAL LOW (ref 8.9–10.3)
Chloride: 99 mmol/L (ref 98–111)
Chloride: 100 mmol/L (ref 98–111)
Creatinine, Ser: 1.39 mg/dL — ABNORMAL HIGH (ref 0.61–1.24)
Creatinine, Ser: 1.67 mg/dL — ABNORMAL HIGH (ref 0.61–1.24)
GFR, Estimated: 54 mL/min — ABNORMAL LOW (ref >60)
GFR, Estimated: 43 mL/min — ABNORMAL LOW (ref >60)
Glucose, Bld: 456 mg/dL — ABNORMAL HIGH (ref 70–99)
Glucose, Bld: 520 mg/dL (ref 70–99)
Potassium: 4.6 mmol/L (ref 3.5–5.1)
Potassium: 5.1 mmol/L (ref 3.5–5.1)
Sodium: 128 mmol/L — ABNORMAL LOW (ref 135–145)
Sodium: 130 mmol/L — ABNORMAL LOW (ref 135–145)

## 2020-09-09 LAB — CBC
HCT: 21.2 % — ABNORMAL LOW (ref 39.0–52.0)
HCT: 24.7 % — ABNORMAL LOW (ref 39.0–52.0)
HCT: 21.4 % — ABNORMAL LOW (ref 39.0–52.0)
Hemoglobin: 6.8 g/dL — CL (ref 13.0–17.0)
Hemoglobin: 8 g/dL — ABNORMAL LOW (ref 13.0–17.0)
Hemoglobin: 6.7 g/dL — CL (ref 13.0–17.0)
MCH: 29.4 pg (ref 26.0–34.0)
MCH: 30.3 pg (ref 26.0–34.0)
MCH: 30 pg (ref 26.0–34.0)
MCHC: 32.1 g/dL (ref 30.0–36.0)
MCHC: 32.4 g/dL (ref 30.0–36.0)
MCHC: 31.3 g/dL (ref 30.0–36.0)
MCV: 91.8 fL (ref 80.0–100.0)
MCV: 93.6 fL (ref 80.0–100.0)
MCV: 96 fL (ref 80.0–100.0)
Platelets: 145 10*3/uL — ABNORMAL LOW (ref 150–400)
Platelets: 142 10*3/uL — ABNORMAL LOW (ref 150–400)
Platelets: 199 10*3/uL (ref 150–400)
RBC: 2.31 MIL/uL — ABNORMAL LOW (ref 4.22–5.81)
RBC: 2.64 MIL/uL — ABNORMAL LOW (ref 4.22–5.81)
RBC: 2.23 MIL/uL — ABNORMAL LOW (ref 4.22–5.81)
RDW: 17.1 % — ABNORMAL HIGH (ref 11.5–15.5)
RDW: 16.5 % — ABNORMAL HIGH (ref 11.5–15.5)
RDW: 16.8 % — ABNORMAL HIGH (ref 11.5–15.5)
WBC: 9.8 10*3/uL (ref 4.0–10.5)
WBC: 10.1 10*3/uL (ref 4.0–10.5)
WBC: 22.1 10*3/uL — ABNORMAL HIGH (ref 4.0–10.5)
nRBC: 0 % (ref 0.0–0.2)
nRBC: 0 % (ref 0.0–0.2)
nRBC: 0 % (ref 0.0–0.2)

## 2020-09-09 LAB — GLUCOSE, CAPILLARY
Glucose-Capillary: 417 mg/dL — ABNORMAL HIGH (ref 70–99)
Glucose-Capillary: 461 mg/dL — ABNORMAL HIGH (ref 70–99)

## 2020-09-09 LAB — CULTURE, BLOOD (ROUTINE X 2): Special Requests: ADEQUATE

## 2020-09-09 LAB — PREPARE RBC (CROSSMATCH)

## 2020-09-09 LAB — AMMONIA: Ammonia: 102 umol/L — ABNORMAL HIGH (ref 9–35)

## 2020-09-09 LAB — D-DIMER, QUANTITATIVE: D-Dimer, Quant: 3.15 ug/mL-FEU — ABNORMAL HIGH (ref 0.00–0.50)

## 2020-09-09 MED ORDER — INSULIN ASPART 100 UNIT/ML ~~LOC~~ SOLN: 5.0000 [IU] | Freq: Three times a day (TID) | SUBCUTANEOUS | Status: DC

## 2020-09-09 MED ORDER — HYDROMORPHONE HCL 1 MG/ML IJ SOLN
0.5000 mg | INTRAMUSCULAR | Status: DC | PRN
Start: 2020-09-09 — End: 2020-09-09
  Administered 2020-09-09: 0.5 mg via INTRAVENOUS
  Filled 2020-09-09: qty 1

## 2020-09-09 MED ORDER — LORAZEPAM 1 MG PO TABS: 1.0000 mg | ORAL_TABLET | ORAL | Status: DC | PRN

## 2020-09-09 MED ORDER — LORAZEPAM 2 MG/ML PO CONC: 1.0000 mg | ORAL | Status: DC | PRN

## 2020-09-09 MED ORDER — LORAZEPAM 2 MG/ML IJ SOLN: 1.0000 mg | INTRAMUSCULAR | Status: DC | PRN

## 2020-09-09 MED ORDER — PROCHLORPERAZINE EDISYLATE 10 MG/2ML IJ SOLN: 10.0000 mg | Freq: Two times a day (BID) | INTRAMUSCULAR | Status: DC | PRN

## 2020-09-09 MED ORDER — SODIUM CHLORIDE 0.9% IV SOLUTION: Freq: Once | INTRAVENOUS | Status: DC

## 2020-09-09 MED ORDER — SODIUM CHLORIDE 0.9 % IV BOLUS: 1000.0000 mL | Freq: Once | INTRAVENOUS | Status: DC

## 2020-09-09 MED ORDER — PROCHLORPERAZINE 25 MG RE SUPP
25.0000 mg | Freq: Two times a day (BID) | RECTAL | Status: DC | PRN
  Filled 2020-09-09: qty 1

## 2020-09-09 MED ORDER — PROCHLORPERAZINE MALEATE 10 MG PO TABS
5.0000 mg | ORAL_TABLET | Freq: Four times a day (QID) | ORAL | Status: DC | PRN
Start: 2020-09-09 — End: 2020-09-09

## 2020-09-09 MED ORDER — GABAPENTIN 100 MG PO CAPS
100.0000 mg | ORAL_CAPSULE | Freq: Three times a day (TID) | ORAL | Status: DC
  Administered 2020-09-09: 10:00:00 100 mg via ORAL
  Filled 2020-09-09: qty 1

## 2020-09-09 MED ORDER — INSULIN GLARGINE 100 UNIT/ML ~~LOC~~ SOLN: 20.0000 [IU] | Freq: Every day | SUBCUTANEOUS | Status: DC

## 2020-09-09 MED ORDER — DEXTROSE-NACL 5-0.45 % IV SOLN: INTRAVENOUS | Status: DC

## 2020-09-11 LAB — CULTURE, BLOOD (ROUTINE X 2)
Culture: NO GROWTH
Special Requests: ADEQUATE

## 2020-09-12 LAB — TYPE AND SCREEN
ABO/RH(D): A POS
Antibody Screen: NEGATIVE
Unit division: 0
Unit division: 0

## 2020-09-12 LAB — BPAM RBC
Blood Product Expiration Date: 202201052359
Blood Product Expiration Date: 202201052359
ISSUE DATE / TIME: 202112110055
Unit Type and Rh: 6200
Unit Type and Rh: 6200

## 2020-09-26 ENCOUNTER — Ambulatory Visit: Payer: Medicare Other | Admitting: Nurse Practitioner

## 2020-09-26 ENCOUNTER — Other Ambulatory Visit: Payer: Medicare Other

## 2020-09-30 NOTE — Death Summary Note (Signed)
Death Summary  Christopher Welch OIT:[id number] DOB: [birth year] DOA: 09/24/2020  PCP: [name], DO  Admit date: 2020/09/24 Date of Death: September 27, 2020 Time of Death:1400 Notification: [name], DO notified of death of 2020-09-27   History of present illness:  Christopher Welch is a 75 y.o. male with a history of  diabetes, CKD stage III, metastatic cholangiocarcinoma with chronic pain who presented on 2020-09-24 with days of nausea, vomiting, diminished Po intake, cough productive of sputum and fevers at home and was found to have sepsis secondary to community-acquired pneumonia.  His hospital course was complicated by developing acute on chronic anemia with maroon colored stool and required blood transfusion. Unfortunately, his anemia worsened with a continuous drop in hemoglobin, hypotension and respiratory distress.  He was transferred to ICU initially due to concern for pulmonary edema needing BiPAP and possibly pressors for blood pressure support. After a goals of care discussion, he decided he only wanted to focus on measures that provided comfort.  He was transferred back to the regular floor under comfort care.       Sepsis secondary to community acquired pneumonia with Klebsiella pneumonia bacteremia, right middle lobe based on CT imaging.   Patient presented with fever of 102.1 and WBC 15.6.  Blood culture positive for Klebsiella pneumonia, remains afebrile, white count had down trended but now slightly up at 11.6 - Was on ceftriaxone, doxycycline  -Strep pneumo negative, Covid PCR negative  Metastatic extrahepatic cholangiocarcinoma previously on hospice but discharged November 2021 -Not currently receiving any treatment, focusing on quality of life  Hyponatremia, mild.    Likely related to diminished p.o. intake related to generalized malaise with nausea/vomiting, currently stable.  Patient has diminished appetite likely in the setting of chronic illness further exacerbated by  infection.   CKD stage III, stable.  Baseline creatinine 1.1-1.4.  Type 2 diabetes, had hypoglycemia in hospital.  Likely related to acute infection  Chronic pain in setting of chronic illness, cancer-related pain -Continue home regimen of morphine MS Contin 30 mg twice daily, oxycodone IR 5 to 10 mg every 6 hours as needed moderate pain --increased pain control for comfort care measures  Acute on chronic normocytic anemia,  Likely related to anemia of chronic disease in the setting of cancer.  With acute worsening concerning for acute blood loss with maroon colored stool. Evaluated by GI, but patient wanted to focus on quality of life and did not want any endoscopic intervention   Thrombocytopenia, stable at baseline     The results of significant diagnostics from this hospitalization (including imaging, microbiology, ancillary and laboratory) are listed below for reference.    Significant Diagnostic Studies: CT ABDOMEN PELVIS W CONTRAST  Result Date: 2020/09/24 CLINICAL DATA:  75 year old male with cholangiocarcinoma. Altered mental status. EXAM: CT ABDOMEN AND PELVIS WITH CONTRAST TECHNIQUE: Multidetector CT imaging of the abdomen and pelvis was performed using the standard protocol following bolus administration of intravenous contrast. CONTRAST:  155mL OMNIPAQUE IOHEXOL 300 MG/ML  SOLN COMPARISON:  Restaging CT Chest, Abdomen, and Pelvis 04/12/2020, and earlier. FINDINGS: Lower chest: Chronic layering right pleural effusion with associated mild pleural thickening and enhancement is unchanged. Underlying simple fluid density. Adjacent right lower lung atelectasis is stable. However, there is also patchy new peribronchial opacity in the right middle lobe (series 4, image 26) and several small indistinct nodular opacities in the visible lingula (series 4, image 4), although there is superimposed respiratory motion. Hepatobiliary: Metal CBD stent remains in place. Pneumobilia has not  significantly  changed. But intrahepatic biliary ductal dilatation has progressed, especially since prior 02/03/2020 CT. No discrete liver lesion. Similar mild gallbladder distension. Pancreas: Increasing indistinct soft tissue medial to the distal CBD stent at the level of the pancreas on series 2, image 33. Associated increasing pancreatic ductal dilatation (series 2, image 34) with severe superimposed atrophy of pancreatic parenchyma which is partially calcified. Spleen: Stable mild splenomegaly. Adrenals/Urinary Tract: Negative adrenal glands. Chronic asymmetric left renal volume loss. Bilateral renal enhancement and contrast excretion appears stable and within normal limits. Chronic benign right renal cysts. Unremarkable urinary bladder. Stomach/Bowel: No dilated large or small bowel. Retained stool throughout much of the colon. Trace fluid layering in both gutters. No discrete bowel inflammation identified. Mild to moderate gas and fluid distension of the stomach similar to the July CT. Abrupt tapering of the duodenum distal to the bulb appear stable. No free air. Vascular/Lymphatic: Calcified aortic atherosclerosis. Major arterial structures in the abdomen and pelvis are patent. The IVC appears to be patent. 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 SMV remains patent. No discrete or progressed abdominal or pelvic lymphadenopathy. Reproductive: Negative. Other: Small volume pelvic free fluid with simple fluid density is new since July (series 2, image 74). Chronic soft tissue thickening along the ventral abdominal wall likely from subcutaneous injections. Musculoskeletal: No acute or suspicious osseous lesion identified. IMPRESSION: 1. Sequelae of cholangiocarcinoma with evidence of progression about the metal CBD stent since the previous contrast enhanced CT Abdomen 02/23/2020. Associated dilatation of obstructed main pancreatic and intrahepatic bile ducts also appears progressed since  May and July. 2. Chronic right pleural effusion and fibrothorax with patchy new right middle lobe and lingula opacity suspicious for developing bilateral respiratory infection. New pulmonary metastatic disease felt less likely. 3. New small volume ascites in the abdomen and pelvis. Electronically Signed   By: Genevie Ann M.D.   On: 09/20/2020 21:18   DG Chest Port 1 View  Result Date: 08/30/2020 CLINICAL DATA:  Altered mental status. EXAM: PORTABLE CHEST 1 VIEW COMPARISON:  October 29, 2019 FINDINGS: There is stable right-sided venous Port-A-Cath positioning. A trace amount of scarring and/or atelectasis is noted within the right lung base. There is a very small right pleural effusion. This is decreased in size when compared to the prior exam. No pneumothorax is identified. The heart size and mediastinal contours are within normal limits. Radiopaque surgical clips and a radiopaque tips stent is seen overlying the right upper quadrant. The visualized skeletal structures are unremarkable. IMPRESSION: 1. Trace amount of right basilar scarring and/or atelectasis. 2. Very small right pleural effusion, decreased in size when compared to the prior study. Electronically Signed   By: Virgina Norfolk M.D.   On: 09/19/2020 19:14    Microbiology: Recent Results (from the past 240 hour(s))  Resp Panel by RT-PCR (Flu A&B, Covid) Nasopharyngeal Swab     Status: None   Collection Time: 08/31/2020  6:17 PM   Specimen: Nasopharyngeal Swab; Nasopharyngeal(NP) swabs in vial transport medium  Result Value Ref Range Status   SARS Coronavirus 2 by RT PCR NEGATIVE NEGATIVE Final    Comment: (NOTE) SARS-CoV-2 target nucleic acids are NOT DETECTED.  The SARS-CoV-2 RNA is generally detectable in upper respiratory specimens during the acute phase of infection. The lowest concentration of SARS-CoV-2 viral copies this assay can detect is 138 copies/mL. A negative result does not preclude SARS-Cov-2 infection and should not be  used as the sole basis for treatment or other patient management decisions. A negative  result may occur with  improper specimen collection/handling, submission of specimen other than nasopharyngeal swab, presence of viral mutation(s) within the areas targeted by this assay, and inadequate number of viral copies(<138 copies/mL). A negative result must be combined with clinical observations, patient history, and epidemiological information. The expected result is Negative.  Fact Sheet for Patients:  EntrepreneurPulse.com.au  Fact Sheet for Healthcare Providers:  IncredibleEmployment.be  This test is no t yet approved or cleared by the [place] FDA and  has been authorized for detection and/or diagnosis of SARS-CoV-2 by FDA under an Emergency Use Authorization (EUA). This EUA will remain  in effect (meaning this test can be used) for the duration of the COVID-19 declaration under Section 564(b)(1) of the Act, 21 U.S.C.section 360bbb-3(b)(1), unless the authorization is terminated  or revoked sooner.       Influenza A by PCR NEGATIVE NEGATIVE Final   Influenza B by PCR NEGATIVE NEGATIVE Final    Comment: (NOTE) The Xpert Xpress SARS-CoV-2/FLU/RSV plus assay is intended as an aid in the diagnosis of influenza from Nasopharyngeal swab specimens and should not be used as a sole basis for treatment. Nasal washings and aspirates are unacceptable for Xpert Xpress SARS-CoV-2/FLU/RSV testing.  Fact Sheet for Patients: EntrepreneurPulse.com.au  Fact Sheet for Healthcare Providers: IncredibleEmployment.be  This test is not yet approved or cleared by the [place] FDA and has been authorized for detection and/or diagnosis of SARS-CoV-2 by FDA under an Emergency Use Authorization (EUA). This EUA will remain in effect (meaning this test can be used) for the duration of the COVID-19 declaration under Section  564(b)(1) of the Act, 21 U.S.C. section 360bbb-3(b)(1), unless the authorization is terminated or revoked.  Performed at [facility], [place] [address] [place], [place] [address]   Culture, blood (Routine x 2)     Status: Abnormal   Collection Time: 09/08/2020  6:19 PM   Specimen: BLOOD  Result Value Ref Range Status   Specimen Description   Final    BLOOD PORTA CATH Performed at [facility] [address] [place], [place] [address]    Special Requests   Final    BOTTLES DRAWN AEROBIC AND ANAEROBIC Blood Culture adequate volume Performed at [facility] [address], [place], [place] [address]    Culture  Setup Time   Final    GRAM NEGATIVE RODS AEROBIC BOTTLE ONLY Organism ID to follow CRITICAL RESULT CALLED TO, READ BACK BY AND VERIFIED WITH: Karie Kirks PharmD 15:00 09/07/20 ([email or web address]) Performed at [facility], [address], [place], [place] [address]    Culture KLEBSIELLA PNEUMONIAE (A)  Final   Report Status 09/30/20 FINAL  Final   Organism ID, Bacteria KLEBSIELLA PNEUMONIAE  Final      Susceptibility   Klebsiella pneumoniae - MIC*    AMPICILLIN RESISTANT Resistant     CEFAZOLIN <=4 SENSITIVE Sensitive     CEFEPIME <=0.12 SENSITIVE Sensitive     CEFTAZIDIME <=1 SENSITIVE Sensitive     CEFTRIAXONE <=0.25 SENSITIVE Sensitive     CIPROFLOXACIN <=0.25 SENSITIVE Sensitive     GENTAMICIN <=1 SENSITIVE Sensitive     IMIPENEM 0.5 SENSITIVE Sensitive     TRIMETH/SULFA <=20 SENSITIVE Sensitive     AMPICILLIN/SULBACTAM <=2 SENSITIVE Sensitive     PIP/TAZO <=4 SENSITIVE Sensitive     * KLEBSIELLA PNEUMONIAE  Culture, blood (Routine x 2)     Status: None (Preliminary result)   Collection Time: 09/02/2020  6:19 PM  Specimen: BLOOD  Result Value Ref Range Status   Specimen Description   Final    BLOOD RIGHT ANTECUBITAL Performed at [facility] [address], [place], [place] [address]     Special Requests   Final    BOTTLES DRAWN AEROBIC AND ANAEROBIC Blood Culture adequate volume Performed at [facility] [address], [place], [place] [address]    Culture   Final    NO GROWTH 3 DAYS Performed at [facility], [place] [address], [place], [place] [address]    Report Status PENDING  Incomplete  Blood Culture ID Panel (Reflexed)     Status: Abnormal   Collection Time: 09/05/2020  6:19 PM  Result Value Ref Range Status   Enterococcus faecalis NOT DETECTED NOT DETECTED Final   Enterococcus Faecium NOT DETECTED NOT DETECTED Final   Listeria monocytogenes NOT DETECTED NOT DETECTED Final   Staphylococcus species NOT DETECTED NOT DETECTED Final   Staphylococcus aureus (BCID) NOT DETECTED NOT DETECTED Final   Staphylococcus epidermidis NOT DETECTED NOT DETECTED Final   Staphylococcus lugdunensis NOT DETECTED NOT DETECTED Final   Streptococcus species NOT DETECTED NOT DETECTED Final   Streptococcus agalactiae NOT DETECTED NOT DETECTED Final   Streptococcus pneumoniae NOT DETECTED NOT DETECTED Final   Streptococcus pyogenes NOT DETECTED NOT DETECTED Final   A.calcoaceticus-baumannii NOT DETECTED NOT DETECTED Final   Bacteroides fragilis NOT DETECTED NOT DETECTED Final   Enterobacterales DETECTED (A) NOT DETECTED Final    Comment: Enterobacterales represent a large order of gram negative bacteria, not a single organism. CRITICAL RESULT CALLED TO, READ BACK BY AND VERIFIED WITH: Karie Kirks PharmD 15:00 09/07/20 ([email or web address])    Enterobacter cloacae complex NOT DETECTED NOT DETECTED Final   Escherichia coli NOT DETECTED NOT DETECTED Final   Klebsiella aerogenes NOT DETECTED NOT DETECTED Final   Klebsiella oxytoca NOT DETECTED NOT DETECTED Final   Klebsiella pneumoniae DETECTED (A) NOT DETECTED Final    Comment: CRITICAL RESULT CALLED TO, READ BACK BY AND VERIFIED WITH: Karie Kirks PharmD 15:00 09/07/20 ([email or web address])    Proteus species NOT DETECTED NOT DETECTED Final    Salmonella species NOT DETECTED NOT DETECTED Final   Serratia marcescens NOT DETECTED NOT DETECTED Final   Haemophilus influenzae NOT DETECTED NOT DETECTED Final   Neisseria meningitidis NOT DETECTED NOT DETECTED Final   Pseudomonas aeruginosa NOT DETECTED NOT DETECTED Final   Stenotrophomonas maltophilia NOT DETECTED NOT DETECTED Final   Candida albicans NOT DETECTED NOT DETECTED Final   Candida auris NOT DETECTED NOT DETECTED Final   Candida glabrata NOT DETECTED NOT DETECTED Final   Candida krusei NOT DETECTED NOT DETECTED Final   Candida parapsilosis NOT DETECTED NOT DETECTED Final   Candida tropicalis NOT DETECTED NOT DETECTED Final   Cryptococcus neoformans/gattii NOT DETECTED NOT DETECTED Final   CTX-M ESBL NOT DETECTED NOT DETECTED Final   Carbapenem resistance IMP NOT DETECTED NOT DETECTED Final   Carbapenem resistance KPC NOT DETECTED NOT DETECTED Final   Carbapenem resistance NDM NOT DETECTED NOT DETECTED Final   Carbapenem resist OXA 48 LIKE NOT DETECTED NOT DETECTED Final   Carbapenem resistance VIM NOT DETECTED NOT DETECTED Final    Comment: Performed at [facility], [address] [place], [place] [address]     Labs: Basic Metabolic Panel: Recent Labs  Lab 09/24/2020 1815 09/07/20 0455 09/08/20 1315 2020-09-27 0400 September 27, 2020 1159  NA 130* 130* 130* 128* 130*  K 4.8 3.6 4.1 4.6 5.1  CL 96* 98  98 99 100  CO2 23 26 25 24  16*  GLUCOSE 416* 345* 419* 456* 520*  BUN 37* 32* 38* 46* 54*  CREATININE 1.43* 1.30* 1.40* 1.39* 1.67*  CALCIUM 8.7* 8.3* 7.8* 7.4* 7.5*  MG 2.0 2.2  --   --   --   PHOS 3.1 2.4*  --   --   --    Liver Function Tests: Recent Labs  Lab 09/13/2020 1815 09/07/20 0455  AST 50* 35  ALT 50* 39  ALKPHOS 232* 184*  BILITOT 3.2* 1.9*  PROT 7.3 6.3*  ALBUMIN 3.1* 2.6*   No results for input(s): LIPASE, AMYLASE in the last 168 hours. Recent Labs  Lab 09/24/2020 1205  AMMONIA 102*   CBC: Recent Labs  Lab 09/26/2020 1815 09/07/20 0455  09/08/20 0949 09/08/20 1555 09/08/20 2200 Sep 24, 2020 0400 24-Sep-2020 1159  WBC 15.6* 10.4 11.5* 8.6 9.8 10.1 22.1*  NEUTROABS 13.1* 8.4*  --   --   --   --   --   HGB 9.2* 8.3* 7.6* 7.5* 6.8* 8.0* 6.7*  HCT 27.7* 25.4* 23.2* 23.1* 21.2* 24.7* 21.4*  MCV 89.4 89.4 90.3 91.7 91.8 93.6 96.0  PLT 163 140* 149* 145* 145* 142* 199   Cardiac Enzymes: No results for input(s): CKTOTAL, CKMB, CKMBINDEX, TROPONINI in the last 168 hours. D-Dimer Recent Labs    Sep 24, 2020 1204  DDIMER 3.15*   BNP: Invalid input(s): POCBNP CBG: Recent Labs  Lab 09/08/20 1216 09/08/20 1555 09/08/20 2119 Sep 24, 2020 0727 09/24/20 1148  GLUCAP 380* 400* 422* 417* 461*   Anemia work up No results for input(s): VITAMINB12, FOLATE, FERRITIN, TIBC, IRON, RETICCTPCT in the last 72 hours. Urinalysis    Component Value Date/Time   COLORURINE YELLOW 09/12/2020 1850   APPEARANCEUR CLEAR 09/15/2020 1850   LABSPEC 1.024 09/25/2020 1850   PHURINE 5.0 09/27/2020 1850   GLUCOSEU >=500 (A) 09/22/2020 1850   HGBUR NEGATIVE 09/22/2020 1850   BILIRUBINUR NEGATIVE 09/17/2020 1850   KETONESUR 20 (A) 09/05/2020 1850   PROTEINUR NEGATIVE 09/17/2020 1850   NITRITE NEGATIVE 09/04/2020 1850   LEUKOCYTESUR NEGATIVE 09/11/2020 1850   Sepsis Labs Invalid input(s): PROCALCITONIN,  WBC,  LACTICIDVEN     SIGNED:  Desiree Hane, MD  [facility] 24-Sep-2020, 3:40 PM Pager   If 7PM-7AM, please contact night-coverage [email or web address] Password [email or web address]

## 2020-09-30 NOTE — Progress Notes (Signed)
At 1145 this RN entered patients room to find him gasping, diaphoretic, and unable to speak. Rapid response RN was called, VS were obtained, patient was placed on oxygen at 4L. Patients oxygen saturation was 87-88% and came up to 90-92%. Patients heart rate was 120's to 130's and B/P was 118/70 to 100/60. MD was notified and came to room. Patient B/P dropped to 70/43 and it was decided to take patient to the Step down unit. Dr Chauncey Cruel. Nettey spoke to patient after getting to the ICU, once he was more alert, and patient stated that he did not want anymore tests or interventions, he just wanted to be comfortable and in no pain. Patient was returned to his room on 6E and made total comfort care. At 1255 patient was given Dilaudid 0.5 mg IV for pain. Patient was sitting in bed ordering lunch and text ing on his phone. At 1400 this RN entered patients room and he was found without heartbeat or respirations. Death was confirmed by Wilkie Aye, RN and Delora Fuel, RN.

## 2020-09-30 NOTE — Progress Notes (Signed)
   September 16, 2020 0118  Assess: MEWS Score  Temp 99.9 F (37.7 C)  BP 119/65  Pulse Rate (!) 114  Resp 20  SpO2 98 %  O2 Device Room Air  Assess: MEWS Score  MEWS Temp 0  MEWS Systolic 0  MEWS Pulse 2  MEWS RR 0  MEWS LOC 0  MEWS Score 2  MEWS Score Color Yellow  Assess: if the MEWS score is Yellow or Red  Were vital signs taken at a resting state? Yes  Focused Assessment No change from prior assessment  Early Detection of Sepsis Score *See Row Information* Medium  MEWS guidelines implemented *See Row Information* No, previously yellow, continue vital signs every 4 hours  Treat  MEWS Interventions Administered prn meds/treatments  Pain Scale 0-10  Pain Score 0  Take Vital Signs  Increase Vital Sign Frequency  Yellow: Q 2hr X 2 then Q 4hr X 2, if remains yellow, continue Q 4hrs (Started blood transfusion)  Escalate  MEWS: Escalate Yellow: discuss with charge nurse/RN and consider discussing with provider and RRT  Notify: Charge Nurse/RN  Name of Charge Nurse/RN Notified Hubert Azure, RN  Date Charge Nurse/RN Notified 2020/09/16  Time Charge Nurse/RN Notified 0120  Notify: Provider  Provider Name/Title [facility]  Date Provider Notified 09/16/2020  Time Provider Notified 0130  Notification Type Page  Notification Reason Other (Comment) (Already aware pt symptomic anemia, blood started)  Response No new orders

## 2020-09-30 NOTE — Progress Notes (Signed)
PT Cancellation Note  Patient Details Name: Christopher Welch MRN: [id number] DOB: [birth year]   Cancelled Treatment:    Reason Eval/Treat Not Completed: Medical issues which prohibited therapy. Pt scheduled for EKG for tachycardia and nursing requesting to hold off on PT at this time.   Galen Manila October 08, 2020, 10:38 AM

## 2020-09-30 DEATH — deceased

## 2020-10-13 ENCOUNTER — Other Ambulatory Visit: Payer: Self-pay | Admitting: Nurse Practitioner

## 2020-10-13 NOTE — Telephone Encounter (Signed)
90 day refill request
